# Patient Record
Sex: Male | Born: 1960
Health system: Southern US, Community
[De-identification: ages and names within clinical notes are randomized; demographics above are authoritative.]

## PROBLEM LIST (undated history)

## (undated) DIAGNOSIS — C719 Malignant neoplasm of brain, unspecified: Secondary | ICD-10-CM

## (undated) DIAGNOSIS — E079 Disorder of thyroid, unspecified: Secondary | ICD-10-CM

## (undated) DIAGNOSIS — R569 Unspecified convulsions: Secondary | ICD-10-CM

## (undated) HISTORY — DX: Unspecified convulsions: R56.9

## (undated) HISTORY — PX: BRAIN SURGERY: SHX531

## (undated) HISTORY — PX: OTHER SURGICAL HISTORY: SHX169

## (undated) HISTORY — DX: Malignant neoplasm of brain, unspecified: C71.9

## (undated) NOTE — *Deleted (*Deleted)
[place] PHYSICAL MEDICINE & REHABILITATION PROGRESS NOTE   Subjective/Complaints:    ROS- neg CP, SOB, N/V/D. No dysuria  Objective:   No results found. No results for input(s): WBC, HGB, HCT, PLT in the last 72 hours. No results for input(s): NA, K, CL, CO2, GLUCOSE, BUN, CREATININE, CALCIUM in the last 72 hours.  Intake/Output Summary (Last 24 hours) at 10/05/2020 1302 Last data filed at 10/05/2020 0806 Gross per 24 hour  Intake 462 ml  Output 750 ml  Net -288 ml        Physical Exam: Vital Signs Blood pressure 125/72, pulse (!) 57, temperature 97.9 F (36.6 C), temperature source Oral, resp. rate 18, height 5\' 10"  (1.778 m), weight 84.4 kg, SpO2 94 %.   General: No acute distress Mood and affect are appropriate Heart: Regular rate and rhythm no rubs murmurs or extra sounds Lungs: Clear to auscultation, breathing unlabored, no rales or wheezes Abdomen: Positive bowel sounds, soft nontender to palpation, nondistended Extremities: No clubbing, cyanosis, or edema Skin: No evidence of breakdown, no evidence of rash  Neurologic: Cranial nerves II through XII intact, motor strength is 5/5 in RIght  deltoid, bicep, tricep, grip, hip flexor, knee extensors, ankle dorsiflexor and plantar flexor Brunnstrom 3 LUE Tone left pec and biceps Sensation to LT reported as equal BUE and BLE Musculoskeletal: Full range of motion in all 4 extremities. No joint swelling  Assessment/Plan: 1. Functional deficits which require 3+ hours per day of interdisciplinary therapy in a comprehensive inpatient rehab setting.  Physiatrist is providing close team supervision and 24 hour management of active medical problems listed below.  Physiatrist and rehab team continue to assess barriers to discharge/monitor patient progress toward functional and medical goals  Care Tool:  Bathing    Body parts bathed by patient: Left arm, Chest, Abdomen, Right upper leg, Left upper leg, Right lower  leg, Left lower leg, Face, Front perineal area   Body parts bathed by helper: Right arm, Buttocks     Bathing assist Assist Level: Moderate Assistance - Patient 50 - 74%     Upper Body Dressing/Undressing Upper body dressing   What is the patient wearing?: Hospital gown only    Upper body assist Assist Level: Total Assistance - Patient < 25%    Lower Body Dressing/Undressing Lower body dressing      What is the patient wearing?: Incontinence brief     Lower body assist Assist for lower body dressing: Dependent - Patient 0%     Toileting Toileting    Toileting assist Assist for toileting: Maximal Assistance - Patient 25 - 49%     Transfers Chair/bed transfer  Transfers assist     Chair/bed transfer assist level: Moderate Assistance - Patient 50 - 74%     Locomotion Ambulation   Ambulation assist      Assist level: 2 helpers (+2 mod A) Assistive device: Hand held assist Max distance: 77ft   Walk 10 feet activity   Assist     Assist level: 2 helpers (+2 mod A) Assistive device: Hand held assist   Walk 50 feet activity   Assist Walk 50 feet with 2 turns activity did not occur: Safety/medical concerns  Assist level: 2 helpers (+2 mod A) Assistive device: Hand held assist    Walk 150 feet activity   Assist Walk 150 feet activity did not occur: Safety/medical concerns         Walk 10 feet on uneven surface  activity   Assist Walk  10 feet on uneven surfaces activity did not occur: Safety/medical concerns         Wheelchair     Assist Will patient use wheelchair at discharge?: Yes Type of Wheelchair: Manual    Wheelchair assist level: Moderate Assistance - Patient 50 - 74% Max wheelchair distance: 100    Wheelchair 50 feet with 2 turns activity    Assist        Assist Level: Moderate Assistance - Patient 50 - 74%   Wheelchair 150 feet activity     Assist      Assist Level: Maximal Assistance - Patient 25 -  49%   Blood pressure 125/72, pulse (!) 57, temperature 97.9 F (36.6 C), temperature source Oral, resp. rate 18, height 5\' 10"  (1.778 m), weight 84.4 kg, SpO2 94 %.    Medical Problem List and Plan: 1.  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  Antithrombotics: -DVT/anticoagulation: SCDs             -antiplatelet therapy: Aspirin 81 mg daily and Plavix 75 mg daily x3 weeks then aspirin alone 3. Pain Management: Tylenol as needed. Denies pain 4. Mood: Lexapro 10 mg daily.  Provide emotional support             -antipsychotic agents: N/A 5. Neuropsych: This patient is capable of making decisions on his own behalf. 6. Skin/Wound Care: Routine skin checks 7. Fluids/Electrolytes/Nutrition: intake yesterday  , meal intake 70-75% Electrolytes stable on 11/13 8.  Seizure disorder.  Continue Lamictal 225 mg daily and 300 mg nightly, no seizure since rehab admission 9.  Hyperlipidemia.  LDL 131 on 11/13. Continue Lipitor 10.  Hypothyroidism.  Synthroid 11.  Leukocytosis 11.6 afebrile  12.  Occ bladder incont but usually cont- denied problems at home PTA 13.  Bowel incont ~50% colace to 200mg  BID, pt feels bowels are moving better  14.  Sleep is fair cont melatonin 5mg  LOS: 5 days A FACE TO FACE EVALUATION WAS PERFORMED  Erick Colace 10/05/2020, 1:02 PM

## (undated) NOTE — *Deleted (*Deleted)
Occupational Therapy Weekly Progress Note  Patient Details  Name: Howard White MRN: [id number] Date of Birth: [birth year]  Beginning of progress report period: {Time; dates multiple:304500300} End of progress report period: {Time; dates multiple:304500300}  {CHL IP REHAB OT TIME CALCULATIONS:304400400}   Patient has met {number 1-5:22450} of {number 1-5:20334} short term goals.  ***  Patient continues to demonstrate the following deficits: {impairments:3041632} and therefore will continue to benefit from skilled OT intervention to enhance overall performance with {ADL/iADL:3041649}.  Patient {LTG progression:3041653}.  {plan of YQMV:7846962}   OT Short Term Goals {OT XBM:8413244}  Skilled Therapeutic Interventions/Progress Updates:      Therapy Documentation Precautions:  Precautions Precautions: Fall Precaution Comments: [name], fluctuating tone in the LUE Restrictions Weight Bearing Restrictions: No  Pain:   ADL: See Care Tool for more details.   Therapy/Group: Individual Therapy  Claudie Revering MS, OTR/L   10/16/2020, 7:27 AM

---

## 2011-10-19 ENCOUNTER — Other Ambulatory Visit: Payer: Self-pay | Admitting: Internal Medicine

## 2011-10-19 DIAGNOSIS — H532 Diplopia: Secondary | ICD-10-CM

## 2011-10-21 ENCOUNTER — Ambulatory Visit
Admission: RE | Admit: 2011-10-21 | Discharge: 2011-10-21 | Disposition: A | Discharge status: Home / Self Care | Payer: Medicare Other | Source: Ambulatory Visit | Attending: Internal Medicine | Admitting: Internal Medicine

## 2011-10-21 DIAGNOSIS — H532 Diplopia: Secondary | ICD-10-CM

## 2011-10-21 MED ORDER — GADOBENATE DIMEGLUMINE 529 MG/ML IV SOLN
20.0000 mL | Freq: Once | INTRAVENOUS | Status: AC | PRN
  Administered 2011-10-21: 20 mL via INTRAVENOUS

## 2011-12-10 DIAGNOSIS — J329 Chronic sinusitis, unspecified: Secondary | ICD-10-CM | POA: Diagnosis not present

## 2011-12-13 DIAGNOSIS — J069 Acute upper respiratory infection, unspecified: Secondary | ICD-10-CM | POA: Diagnosis not present

## 2012-02-07 DIAGNOSIS — E039 Hypothyroidism, unspecified: Secondary | ICD-10-CM | POA: Diagnosis not present

## 2012-02-07 DIAGNOSIS — H519 Unspecified disorder of binocular movement: Secondary | ICD-10-CM | POA: Diagnosis not present

## 2012-02-07 DIAGNOSIS — H532 Diplopia: Secondary | ICD-10-CM | POA: Diagnosis not present

## 2012-02-07 DIAGNOSIS — M6281 Muscle weakness (generalized): Secondary | ICD-10-CM | POA: Diagnosis not present

## 2012-02-22 DIAGNOSIS — R5381 Other malaise: Secondary | ICD-10-CM | POA: Diagnosis not present

## 2012-02-22 DIAGNOSIS — N529 Male erectile dysfunction, unspecified: Secondary | ICD-10-CM | POA: Diagnosis not present

## 2012-02-22 DIAGNOSIS — R5383 Other fatigue: Secondary | ICD-10-CM | POA: Diagnosis not present

## 2012-02-29 DIAGNOSIS — R21 Rash and other nonspecific skin eruption: Secondary | ICD-10-CM | POA: Diagnosis not present

## 2012-02-29 DIAGNOSIS — F329 Major depressive disorder, single episode, unspecified: Secondary | ICD-10-CM | POA: Diagnosis not present

## 2012-03-27 DIAGNOSIS — IMO0002 Reserved for concepts with insufficient information to code with codable children: Secondary | ICD-10-CM | POA: Diagnosis not present

## 2012-03-27 DIAGNOSIS — H519 Unspecified disorder of binocular movement: Secondary | ICD-10-CM | POA: Diagnosis not present

## 2012-03-27 DIAGNOSIS — C719 Malignant neoplasm of brain, unspecified: Secondary | ICD-10-CM | POA: Diagnosis not present

## 2012-03-27 DIAGNOSIS — H5000 Unspecified esotropia: Secondary | ICD-10-CM | POA: Diagnosis not present

## 2012-03-28 DIAGNOSIS — E039 Hypothyroidism, unspecified: Secondary | ICD-10-CM | POA: Diagnosis not present

## 2012-03-28 DIAGNOSIS — F329 Major depressive disorder, single episode, unspecified: Secondary | ICD-10-CM | POA: Diagnosis not present

## 2012-05-02 DIAGNOSIS — F329 Major depressive disorder, single episode, unspecified: Secondary | ICD-10-CM | POA: Diagnosis not present

## 2012-05-02 DIAGNOSIS — E039 Hypothyroidism, unspecified: Secondary | ICD-10-CM | POA: Diagnosis not present

## 2012-05-04 DIAGNOSIS — IMO0002 Reserved for concepts with insufficient information to code with codable children: Secondary | ICD-10-CM | POA: Diagnosis not present

## 2012-05-04 DIAGNOSIS — H52229 Regular astigmatism, unspecified eye: Secondary | ICD-10-CM | POA: Diagnosis not present

## 2012-05-04 DIAGNOSIS — H5210 Myopia, unspecified eye: Secondary | ICD-10-CM | POA: Diagnosis not present

## 2012-05-04 DIAGNOSIS — H5010 Unspecified exotropia: Secondary | ICD-10-CM | POA: Diagnosis not present

## 2012-05-04 DIAGNOSIS — Z79899 Other long term (current) drug therapy: Secondary | ICD-10-CM | POA: Diagnosis not present

## 2012-05-04 DIAGNOSIS — H524 Presbyopia: Secondary | ICD-10-CM | POA: Diagnosis not present

## 2012-07-03 DIAGNOSIS — C768 Malignant neoplasm of other specified ill-defined sites: Secondary | ICD-10-CM | POA: Diagnosis not present

## 2012-07-03 DIAGNOSIS — Z79899 Other long term (current) drug therapy: Secondary | ICD-10-CM | POA: Diagnosis not present

## 2012-07-03 DIAGNOSIS — IMO0002 Reserved for concepts with insufficient information to code with codable children: Secondary | ICD-10-CM | POA: Diagnosis not present

## 2012-07-03 DIAGNOSIS — H519 Unspecified disorder of binocular movement: Secondary | ICD-10-CM | POA: Diagnosis not present

## 2012-07-03 DIAGNOSIS — H5000 Unspecified esotropia: Secondary | ICD-10-CM | POA: Diagnosis not present

## 2012-08-01 DIAGNOSIS — R5381 Other malaise: Secondary | ICD-10-CM | POA: Diagnosis not present

## 2012-11-22 ENCOUNTER — Other Ambulatory Visit (HOSPITAL_COMMUNITY): Payer: Self-pay | Admitting: Internal Medicine

## 2012-11-22 DIAGNOSIS — C719 Malignant neoplasm of brain, unspecified: Secondary | ICD-10-CM | POA: Diagnosis not present

## 2012-11-22 DIAGNOSIS — F05 Delirium due to known physiological condition: Secondary | ICD-10-CM

## 2012-11-22 DIAGNOSIS — R569 Unspecified convulsions: Secondary | ICD-10-CM | POA: Diagnosis not present

## 2012-11-27 ENCOUNTER — Ambulatory Visit (HOSPITAL_COMMUNITY)
Admission: RE | Admit: 2012-11-27 | Discharge: 2012-11-27 | Disposition: A | Discharge status: Home / Self Care | Payer: Medicare Other | Source: Ambulatory Visit | Attending: Internal Medicine | Admitting: Internal Medicine

## 2012-11-27 DIAGNOSIS — G8929 Other chronic pain: Secondary | ICD-10-CM | POA: Diagnosis not present

## 2012-11-27 DIAGNOSIS — C719 Malignant neoplasm of brain, unspecified: Secondary | ICD-10-CM

## 2012-11-27 DIAGNOSIS — R110 Nausea: Secondary | ICD-10-CM | POA: Diagnosis not present

## 2012-11-27 DIAGNOSIS — R259 Unspecified abnormal involuntary movements: Secondary | ICD-10-CM | POA: Diagnosis not present

## 2012-11-27 DIAGNOSIS — R569 Unspecified convulsions: Secondary | ICD-10-CM

## 2012-11-27 DIAGNOSIS — R55 Syncope and collapse: Secondary | ICD-10-CM | POA: Insufficient documentation

## 2012-11-27 DIAGNOSIS — F05 Delirium due to known physiological condition: Secondary | ICD-10-CM

## 2012-11-27 MED ORDER — GADOBENATE DIMEGLUMINE 529 MG/ML IV SOLN
20.0000 mL | Freq: Once | INTRAVENOUS | Status: AC | PRN
  Administered 2012-11-27: 20 mL via INTRAVENOUS

## 2012-11-28 NOTE — Procedures (Signed)
EEG NUMBER:  [id number]  REFERRING PHYSICIAN:  Unavailable.  INDICATION FOR STUDY:  A 52 year old man with a history of left brain cancer and surgical resection at age 41.  He recently had an episode where he lost use of his right arm and was unable to speak transiently. Study is being performed to rule out signs of focal seizure activity.  DESCRIPTION:  This is a routine EEG recording performed during wakefulness.  Background activity was asymmetric consistent with the patient's history of tumor resection from the left hemisphere. Continuous 4-5 Hz moderate amplitude beta activity was recorded from the left frontoparietal and temporal regions, which at times was somewhat rhythmic.  Background activity recorded from the right hemisphere was normal.  10 Hz alpha rhythm was recorded from the right and left occipital regions, which attenuated well with eye opening.  Photic stimulation produced a symmetrical occipital driving response. Hyperventilation produced bilateral slowing of cerebral activity, which was slightly more pronounced on the left compared to the right. Occasional phase reversing left frontal sharp wave discharges were recorded.  INTERPRETATION:  EEG is abnormal with focal slowing of activity recorded from the left frontal, parietal, and temporal regions consistent with the patient's history of prior tumor resection.  Sharp wave discharges as well as rhythmic slowing were recorded from the left hemisphere indicative of likely potential for producing clinical seizure activity.     Noel Christmas, MD    AV:WUJW D:  11/27/2012 17:00:51  T:  11/28/2012 02:34:02  Job #:  [id number]

## 2012-11-30 DIAGNOSIS — C719 Malignant neoplasm of brain, unspecified: Secondary | ICD-10-CM | POA: Diagnosis not present

## 2012-11-30 DIAGNOSIS — R569 Unspecified convulsions: Secondary | ICD-10-CM | POA: Diagnosis not present

## 2013-03-30 ENCOUNTER — Ambulatory Visit (INDEPENDENT_AMBULATORY_CARE_PROVIDER_SITE_OTHER): Payer: Medicare Other | Admitting: Nurse Practitioner

## 2013-03-30 ENCOUNTER — Encounter: Payer: Self-pay | Admitting: Nurse Practitioner

## 2013-03-30 VITALS — BP 107/67 | HR 76 | Ht 70.75 in | Wt 213.0 lb

## 2013-03-30 DIAGNOSIS — C719 Malignant neoplasm of brain, unspecified: Secondary | ICD-10-CM | POA: Diagnosis not present

## 2013-03-30 DIAGNOSIS — R569 Unspecified convulsions: Secondary | ICD-10-CM | POA: Diagnosis not present

## 2013-03-30 DIAGNOSIS — G40909 Epilepsy, unspecified, not intractable, without status epilepticus: Secondary | ICD-10-CM | POA: Insufficient documentation

## 2013-03-30 NOTE — Progress Notes (Signed)
HPI: Patient returns for followup after initial evaluation by Dr. Terrace Arabia 11/30/2012. He has a past medical history of left frontal anaplastic astrocytoma grade 3, underwent resection at [facility] followed by radiation and chemotherapy. He had one generalized seizure in February 2001 and few complex partial seizures prior to her surgery. Resection occurred in March 2001, he did well postsurgically was taking Dilantin switched to Keppra 500 2  tablets at night. He had a seizure 11/08/2012 in the setting of family stress and having a beer. He was noted to have difficulty getting his words out,  numbness of his right shoulder and arm and right leg jerking but no loss of consciousness. This lasted approximately 5 minutes. Repeat MRI of the brain showed left frontal lobectomy, periventricular white matter changes consistent with radiation damage but no acute lesions in comparison to previous scan December 2012. He has not had further seizure activity since that time. His Keppra was increased to 750 extended release 2 at night. He denies any side effects of the medication  ROS: f/u seizure  Physical Exam General: well developed, well nourished, seated, in no evident distress Head: well-healed left frontal scar Oropharynx benign Neck: supple with no carotid or supraclavicular bruits Cardiovascular: regular rate and rhythm, no murmurs  Neurologic Exam Mental Status: Awake and fully alert. Oriented to place and time. Follows all commands. Mood and affect appropriate.  Cranial Nerves: Fundoscopic exam reveals sharp disc margins. Pupils equal, briskly reactive to light. Extraocular movements full without nystagmus. Visual fields full to confrontation. Hearing intact and symmetric to finger snap. Facial sensation intact. Face, tongue, palate move normally and symmetrically. Neck flexion and extension normal.  Motor: Normal bulk and tone. Normal strength in all tested extremity muscles. Sensory.: intact to touch  and pinprick and vibratory.  Coordination: Rapid alternating movements normal in all extremities. Finger-to-nose and heel-to-shin performed accurately bilaterally. Gait and Station: Arises from chair without difficulty. Stance is normal. Gait demonstrates normal stride length and balance . Able to heel, toe and tandem walk without difficulty.  Reflexes:2+ and symmetric. Toes downgoing.     ASSESSMENT: History of left frontal anaplastic astrocytoma status post left frontal lobectomy followed by chemotherapy and radiation with recurrent complex partial seizure 11/08/2012. MRI showed stable postsurgical changes.     PLAN: He will continue his Keppra XR 750mg  2 at night. He does not need refills He was given a list of common seizure triggers He will followup in 6 months  Nilda Riggs, GNP-BC APRN

## 2013-03-30 NOTE — Patient Instructions (Addendum)
Pt to continue Keppra XR 750mg  2 hs does not need refills Pt given examples of seizure triggers F/U 6 months

## 2013-07-18 DIAGNOSIS — F329 Major depressive disorder, single episode, unspecified: Secondary | ICD-10-CM | POA: Diagnosis not present

## 2013-07-18 DIAGNOSIS — E039 Hypothyroidism, unspecified: Secondary | ICD-10-CM | POA: Diagnosis not present

## 2013-07-18 DIAGNOSIS — R5381 Other malaise: Secondary | ICD-10-CM | POA: Diagnosis not present

## 2013-08-20 DIAGNOSIS — E039 Hypothyroidism, unspecified: Secondary | ICD-10-CM | POA: Diagnosis not present

## 2013-08-28 DIAGNOSIS — L570 Actinic keratosis: Secondary | ICD-10-CM | POA: Diagnosis not present

## 2013-08-28 DIAGNOSIS — D485 Neoplasm of uncertain behavior of skin: Secondary | ICD-10-CM | POA: Diagnosis not present

## 2013-08-28 DIAGNOSIS — L820 Inflamed seborrheic keratosis: Secondary | ICD-10-CM | POA: Diagnosis not present

## 2013-08-28 DIAGNOSIS — D239 Other benign neoplasm of skin, unspecified: Secondary | ICD-10-CM | POA: Diagnosis not present

## 2013-08-28 DIAGNOSIS — L821 Other seborrheic keratosis: Secondary | ICD-10-CM | POA: Diagnosis not present

## 2013-09-03 DIAGNOSIS — M25569 Pain in unspecified knee: Secondary | ICD-10-CM | POA: Diagnosis not present

## 2013-09-10 DIAGNOSIS — M25569 Pain in unspecified knee: Secondary | ICD-10-CM | POA: Diagnosis not present

## 2013-09-10 DIAGNOSIS — IMO0002 Reserved for concepts with insufficient information to code with codable children: Secondary | ICD-10-CM | POA: Diagnosis not present

## 2013-09-12 DIAGNOSIS — IMO0002 Reserved for concepts with insufficient information to code with codable children: Secondary | ICD-10-CM | POA: Diagnosis not present

## 2013-09-12 DIAGNOSIS — M25569 Pain in unspecified knee: Secondary | ICD-10-CM | POA: Diagnosis not present

## 2013-09-14 DIAGNOSIS — M25569 Pain in unspecified knee: Secondary | ICD-10-CM | POA: Diagnosis not present

## 2013-09-14 DIAGNOSIS — IMO0002 Reserved for concepts with insufficient information to code with codable children: Secondary | ICD-10-CM | POA: Diagnosis not present

## 2013-09-17 DIAGNOSIS — M25569 Pain in unspecified knee: Secondary | ICD-10-CM | POA: Diagnosis not present

## 2013-09-17 DIAGNOSIS — IMO0002 Reserved for concepts with insufficient information to code with codable children: Secondary | ICD-10-CM | POA: Diagnosis not present

## 2013-10-05 ENCOUNTER — Ambulatory Visit: Payer: Medicare Other | Admitting: Nurse Practitioner

## 2013-11-26 DIAGNOSIS — D485 Neoplasm of uncertain behavior of skin: Secondary | ICD-10-CM | POA: Diagnosis not present

## 2013-11-26 DIAGNOSIS — L570 Actinic keratosis: Secondary | ICD-10-CM | POA: Diagnosis not present

## 2013-11-30 DIAGNOSIS — IMO0002 Reserved for concepts with insufficient information to code with codable children: Secondary | ICD-10-CM | POA: Diagnosis not present

## 2013-11-30 DIAGNOSIS — S99929A Unspecified injury of unspecified foot, initial encounter: Secondary | ICD-10-CM | POA: Diagnosis not present

## 2013-11-30 DIAGNOSIS — S59909A Unspecified injury of unspecified elbow, initial encounter: Secondary | ICD-10-CM | POA: Diagnosis not present

## 2013-11-30 DIAGNOSIS — S8990XA Unspecified injury of unspecified lower leg, initial encounter: Secondary | ICD-10-CM | POA: Diagnosis not present

## 2013-12-17 ENCOUNTER — Encounter (INDEPENDENT_AMBULATORY_CARE_PROVIDER_SITE_OTHER): Payer: Self-pay

## 2013-12-17 ENCOUNTER — Other Ambulatory Visit: Payer: Self-pay | Admitting: Neurology

## 2013-12-17 ENCOUNTER — Encounter: Payer: Self-pay | Admitting: Nurse Practitioner

## 2013-12-17 ENCOUNTER — Ambulatory Visit (INDEPENDENT_AMBULATORY_CARE_PROVIDER_SITE_OTHER): Payer: Medicare Other | Admitting: Nurse Practitioner

## 2013-12-17 VITALS — BP 115/70 | HR 65 | Ht 70.5 in | Wt 206.0 lb

## 2013-12-17 DIAGNOSIS — R569 Unspecified convulsions: Secondary | ICD-10-CM | POA: Diagnosis not present

## 2013-12-17 DIAGNOSIS — C719 Malignant neoplasm of brain, unspecified: Secondary | ICD-10-CM

## 2013-12-17 MED ORDER — LEVETIRACETAM ER 750 MG PO TB24: ORAL_TABLET | ORAL | Status: DC

## 2013-12-17 NOTE — Progress Notes (Signed)
[facility]  PATIENT: Howard White DOB: [birth year]   REASON FOR VISIT: follow up for seizure disorder   HISTORY OF PRESENT ILLNESS: Mr. Buttery, 53 year old male returns for followup. He has a history of seizure disorder with last seizure occurring 11/08/2012. He is currently on Keppra extended release 750 mg 2 tabs at night. He denies side effects to the medication. He returns for refills and reevaluation   HISTORY: He has a past medical history of left frontal anaplastic astrocytoma grade 3, underwent resection at [facility] followed by radiation and chemotherapy. He had one generalized seizure in February 2001 and few complex partial seizures prior to her surgery. Resection occurred in March 2001, he did well postsurgically was taking Dilantin switched to Keppra 500 2 tablets at night. He had a seizure 11/08/2012 in the setting of family stress and having a beer. He was noted to have difficulty getting his words out, numbness of his right shoulder and arm and right leg jerking but no loss of consciousness. This lasted approximately 5 minutes. Repeat MRI of the brain showed left frontal lobectomy, periventricular white matter changes consistent with radiation damage but no acute lesions in comparison to previous scan December 2012. He has not had further seizure activity since that time. His Keppra was increased to 750 extended release 2 at night. He denies any side effects of the medication   REVIEW OF SYSTEMS: Full 14 system review of systems performed and notable only for those listed, all others are neg:  Constitutional: N/A  Cardiovascular: N/A  Ear/Nose/Throat: N/A  Skin: N/A  Eyes: N/A  Respiratory: N/A  Gastroitestinal: N/A  Hematology/Lymphatic: N/A  Endocrine: N/A Musculoskeletal:N/A  Allergy/Immunology: N/A  Neurological: N/A Psychiatric: N/A   ALLERGIES: Allergies  Allergen Reactions  . Lactose Intolerance (Gi)     HOME  MEDICATIONS: Outpatient Prescriptions Prior to Visit  Medication Sig Dispense Refill  . escitalopram (LEXAPRO) 20 MG tablet       . fluticasone (FLONASE) 50 MCG/ACT nasal spray       . levothyroxine (SYNTHROID, LEVOTHROID) 50 MCG tablet        No facility-administered medications prior to visit.    PAST MEDICAL HISTORY: Past Medical History  Diagnosis Date  . Brain cancer   . Seizures     PAST SURGICAL HISTORY: Past Surgical History  Procedure Laterality Date  . Brain cancer      FAMILY HISTORY: Family History  Problem Relation Age of Onset  . Brain cancer    . Thyroid disease    . Cancer    . Diabetes    . Stroke      SOCIAL HISTORY: History   Social History  . Marital Status: Divorced    Spouse Name: N/A    Number of Children: 0  . Years of Education: Masters   Occupational History  . Disability    Social History Main Topics  . Smoking status: Never Smoker   . Smokeless tobacco: Never Used  . Alcohol Use: Yes     Comment: 2 beers a month  . Drug Use: No  . Sexual Activity: Not on file   Other Topics Concern  . Not on file   Social History Narrative   Patient lives at home alone.    Patient is on long term disability.    Patient is divorced.    Patient has no children.    Patient has his Master's in Business.    Patient is right-handed.  PHYSICAL EXAM  Filed Vitals:   12/17/13 1448  BP: 115/70  Pulse: 65  Height: 5' 10.5" (1.791 m)  Weight: 206 lb (93.441 kg)   Body mass index is 29.13 kg/(m^2).  Generalized: Well developed, in no acute distress  Head: Healed left frontal scar.    Neurological examination   Mentation: Alert oriented to time, place, history taking. Follows all commands speech and language fluent  Cranial nerve II-XII: Fundoscopic exam reveals sharp disc margins.Pupils were equal round reactive to light extraocular movements were full, visual field were full on confrontational test. Facial sensation and strength  were normal. hearing was intact to finger rubbing bilaterally. Uvula tongue midline. head turning and shoulder shrug were normal and symmetric.Tongue protrusion into cheek strength was normal. Motor: normal bulk and tone, full strength in the BUE, BLE, fine finger movements normal, no pronator drift. No focal weakness Coordination: finger-nose-finger, heel-to-shin bilaterally, no dysmetria Reflexes: Brachioradialis 2/2, biceps 2/2, triceps 2/2, patellar 2/2, Achilles 2/2, plantar responses were flexor bilaterally. Gait and Station: Rising up from seated position without assistance, normal stance,  moderate stride, good arm swing, smooth turning, able to perform tiptoe, and heel walking without difficulty. Tandem gait is steady  DIAGNOSTIC DATA (LABS, IMAGING, TESTING) -  ASSESSMENT AND PLAN  53 y.o. year old male  has a past medical history of Brain cancer and Seizures. here in followup. Last seizure occurred 11/08/2012.   He is currently well controlled on Keppra Continue Keppra at current dose Will refill for one year Followup yearly and when necessary Dennie Bible, [facility], [facility], [facility] [address], [place], [place] [address] [phone]

## 2013-12-17 NOTE — Patient Instructions (Signed)
Continue Keppra at current dose Will refill for one year Followup yearly and when necessary

## 2014-01-02 DIAGNOSIS — L089 Local infection of the skin and subcutaneous tissue, unspecified: Secondary | ICD-10-CM | POA: Diagnosis not present

## 2014-01-03 ENCOUNTER — Encounter (HOSPITAL_BASED_OUTPATIENT_CLINIC_OR_DEPARTMENT_OTHER): Payer: Medicare Other | Attending: Internal Medicine

## 2014-01-03 DIAGNOSIS — E039 Hypothyroidism, unspecified: Secondary | ICD-10-CM | POA: Diagnosis not present

## 2014-01-03 DIAGNOSIS — S71109A Unspecified open wound, unspecified thigh, initial encounter: Secondary | ICD-10-CM | POA: Diagnosis not present

## 2014-01-03 DIAGNOSIS — S71009A Unspecified open wound, unspecified hip, initial encounter: Secondary | ICD-10-CM | POA: Diagnosis not present

## 2014-01-03 DIAGNOSIS — Z79899 Other long term (current) drug therapy: Secondary | ICD-10-CM | POA: Insufficient documentation
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
## 2014-01-08 DIAGNOSIS — E039 Hypothyroidism, unspecified: Secondary | ICD-10-CM | POA: Diagnosis not present

## 2014-01-08 DIAGNOSIS — S71009A Unspecified open wound, unspecified hip, initial encounter: Secondary | ICD-10-CM | POA: Diagnosis not present

## 2014-01-08 DIAGNOSIS — Z79899 Other long term (current) drug therapy: Secondary | ICD-10-CM | POA: Diagnosis not present

## 2014-01-11 DIAGNOSIS — L089 Local infection of the skin and subcutaneous tissue, unspecified: Secondary | ICD-10-CM | POA: Diagnosis not present

## 2014-01-11 DIAGNOSIS — Z23 Encounter for immunization: Secondary | ICD-10-CM | POA: Diagnosis not present

## 2014-01-15 ENCOUNTER — Encounter (HOSPITAL_BASED_OUTPATIENT_CLINIC_OR_DEPARTMENT_OTHER): Payer: Medicare Other | Attending: General Surgery

## 2014-01-15 DIAGNOSIS — S71109A Unspecified open wound, unspecified thigh, initial encounter: Principal | ICD-10-CM | POA: Insufficient documentation

## 2014-01-15 DIAGNOSIS — X58XXXA Exposure to other specified factors, initial encounter: Secondary | ICD-10-CM | POA: Insufficient documentation

## 2014-01-15 DIAGNOSIS — S71009A Unspecified open wound, unspecified hip, initial encounter: Secondary | ICD-10-CM | POA: Diagnosis not present

## 2014-01-22 DIAGNOSIS — S71009A Unspecified open wound, unspecified hip, initial encounter: Secondary | ICD-10-CM | POA: Diagnosis not present

## 2014-01-29 DIAGNOSIS — S71009A Unspecified open wound, unspecified hip, initial encounter: Secondary | ICD-10-CM | POA: Diagnosis not present

## 2014-01-29 DIAGNOSIS — S71109A Unspecified open wound, unspecified thigh, initial encounter: Secondary | ICD-10-CM | POA: Diagnosis not present

## 2014-02-05 DIAGNOSIS — S71009A Unspecified open wound, unspecified hip, initial encounter: Secondary | ICD-10-CM | POA: Diagnosis not present

## 2014-02-06 DIAGNOSIS — J3489 Other specified disorders of nose and nasal sinuses: Secondary | ICD-10-CM | POA: Diagnosis not present

## 2014-02-12 DIAGNOSIS — S71009A Unspecified open wound, unspecified hip, initial encounter: Secondary | ICD-10-CM | POA: Diagnosis not present

## 2014-02-19 ENCOUNTER — Encounter (HOSPITAL_BASED_OUTPATIENT_CLINIC_OR_DEPARTMENT_OTHER): Payer: Medicare Other | Attending: General Surgery

## 2014-02-19 DIAGNOSIS — IMO0002 Reserved for concepts with insufficient information to code with codable children: Secondary | ICD-10-CM | POA: Insufficient documentation

## 2014-02-19 DIAGNOSIS — X58XXXA Exposure to other specified factors, initial encounter: Secondary | ICD-10-CM | POA: Insufficient documentation

## 2014-02-20 DIAGNOSIS — J3489 Other specified disorders of nose and nasal sinuses: Secondary | ICD-10-CM | POA: Diagnosis not present

## 2014-02-20 DIAGNOSIS — L02419 Cutaneous abscess of limb, unspecified: Secondary | ICD-10-CM | POA: Diagnosis not present

## 2014-02-20 DIAGNOSIS — L03119 Cellulitis of unspecified part of limb: Secondary | ICD-10-CM | POA: Diagnosis not present

## 2014-02-26 DIAGNOSIS — IMO0002 Reserved for concepts with insufficient information to code with codable children: Secondary | ICD-10-CM | POA: Diagnosis not present

## 2014-06-21 DIAGNOSIS — E039 Hypothyroidism, unspecified: Secondary | ICD-10-CM | POA: Diagnosis not present

## 2014-06-21 DIAGNOSIS — F329 Major depressive disorder, single episode, unspecified: Secondary | ICD-10-CM | POA: Diagnosis not present

## 2014-06-21 DIAGNOSIS — F3289 Other specified depressive episodes: Secondary | ICD-10-CM | POA: Diagnosis not present

## 2014-06-21 DIAGNOSIS — G9332 Myalgic encephalomyelitis/chronic fatigue syndrome: Secondary | ICD-10-CM | POA: Diagnosis not present

## 2014-06-21 DIAGNOSIS — R5382 Chronic fatigue, unspecified: Secondary | ICD-10-CM | POA: Diagnosis not present

## 2014-07-12 ENCOUNTER — Other Ambulatory Visit: Payer: Self-pay | Admitting: Internal Medicine

## 2014-07-12 DIAGNOSIS — R5383 Other fatigue: Secondary | ICD-10-CM | POA: Diagnosis not present

## 2014-07-12 DIAGNOSIS — C719 Malignant neoplasm of brain, unspecified: Secondary | ICD-10-CM | POA: Diagnosis not present

## 2014-07-12 DIAGNOSIS — R5381 Other malaise: Secondary | ICD-10-CM | POA: Diagnosis not present

## 2014-07-12 DIAGNOSIS — R569 Unspecified convulsions: Secondary | ICD-10-CM | POA: Diagnosis not present

## 2014-07-15 ENCOUNTER — Ambulatory Visit
Admission: RE | Admit: 2014-07-15 | Discharge: 2014-07-15 | Disposition: A | Discharge status: Home / Self Care | Payer: Medicare Other | Source: Ambulatory Visit | Attending: Internal Medicine | Admitting: Internal Medicine

## 2014-07-15 DIAGNOSIS — G9389 Other specified disorders of brain: Secondary | ICD-10-CM | POA: Diagnosis not present

## 2014-07-15 DIAGNOSIS — C719 Malignant neoplasm of brain, unspecified: Secondary | ICD-10-CM

## 2014-07-15 MED ORDER — GADOBENATE DIMEGLUMINE 529 MG/ML IV SOLN
19.0000 mL | Freq: Once | INTRAVENOUS | Status: AC | PRN
  Administered 2014-07-15: 19 mL via INTRAVENOUS

## 2014-07-18 ENCOUNTER — Ambulatory Visit (INDEPENDENT_AMBULATORY_CARE_PROVIDER_SITE_OTHER): Payer: Medicare Other | Admitting: Radiology

## 2014-07-18 ENCOUNTER — Other Ambulatory Visit: Payer: Medicare Other | Admitting: Radiology

## 2014-07-18 DIAGNOSIS — G40909 Epilepsy, unspecified, not intractable, without status epilepticus: Secondary | ICD-10-CM

## 2014-07-19 NOTE — Procedures (Signed)
   HISTORY: 53 years old male, with history of anaplastic astrocytoma, presenting with fatigue, weakness  TECHNIQUE:  16 channel EEG was performed based on standard 10-16 international system. One channel was dedicated to EKG, which has demonstrates normal sinus rhythm of 60 beats per minutes. She has a surgical scar at C3 placement side, with plate.  Upon awakening, the posterior background activity was well-developed, in alpha range,reactive to eye opening and closure. There was persistent asymmetry at left frontal area, mainly involving left C3, F3,, high amplitude, dysarrythmic theta range activity, sometimes with sharp spike appearance.  Photic stimulation was performed, which induced a symmetric photic driving.  Hyperventilation was not performed   Patient was drowsy during according, but no deeper stage of sleep was achieved.  CONCLUSION: This is an abnormal awake EEG.  There is  electrodiagnostic evidence of left frontal slowing, spike slow waves, consistent with local irritability, this is consistent with his history of left frontal craniotomy

## 2014-08-16 DIAGNOSIS — R400 Somnolence: Secondary | ICD-10-CM | POA: Diagnosis not present

## 2014-08-16 DIAGNOSIS — F329 Major depressive disorder, single episode, unspecified: Secondary | ICD-10-CM | POA: Diagnosis not present

## 2014-08-16 DIAGNOSIS — C719 Malignant neoplasm of brain, unspecified: Secondary | ICD-10-CM | POA: Diagnosis not present

## 2014-08-16 DIAGNOSIS — R569 Unspecified convulsions: Secondary | ICD-10-CM | POA: Diagnosis not present

## 2014-08-16 DIAGNOSIS — E039 Hypothyroidism, unspecified: Secondary | ICD-10-CM | POA: Diagnosis not present

## 2014-08-19 DIAGNOSIS — R569 Unspecified convulsions: Secondary | ICD-10-CM | POA: Diagnosis not present

## 2014-08-19 DIAGNOSIS — Z13220 Encounter for screening for lipoid disorders: Secondary | ICD-10-CM | POA: Diagnosis not present

## 2014-08-19 DIAGNOSIS — Z136 Encounter for screening for cardiovascular disorders: Secondary | ICD-10-CM | POA: Diagnosis not present

## 2014-08-19 DIAGNOSIS — E039 Hypothyroidism, unspecified: Secondary | ICD-10-CM | POA: Diagnosis not present

## 2014-08-19 DIAGNOSIS — C719 Malignant neoplasm of brain, unspecified: Secondary | ICD-10-CM | POA: Diagnosis not present

## 2014-08-19 DIAGNOSIS — Z125 Encounter for screening for malignant neoplasm of prostate: Secondary | ICD-10-CM | POA: Diagnosis not present

## 2014-08-19 DIAGNOSIS — Z0000 Encounter for general adult medical examination without abnormal findings: Secondary | ICD-10-CM | POA: Diagnosis not present

## 2014-08-19 DIAGNOSIS — F329 Major depressive disorder, single episode, unspecified: Secondary | ICD-10-CM | POA: Diagnosis not present

## 2014-08-19 DIAGNOSIS — Z1389 Encounter for screening for other disorder: Secondary | ICD-10-CM | POA: Diagnosis not present

## 2014-09-03 DIAGNOSIS — L821 Other seborrheic keratosis: Secondary | ICD-10-CM | POA: Diagnosis not present

## 2014-09-03 DIAGNOSIS — Z411 Encounter for cosmetic surgery: Secondary | ICD-10-CM | POA: Diagnosis not present

## 2014-09-03 DIAGNOSIS — D485 Neoplasm of uncertain behavior of skin: Secondary | ICD-10-CM | POA: Diagnosis not present

## 2014-09-03 DIAGNOSIS — D239 Other benign neoplasm of skin, unspecified: Secondary | ICD-10-CM | POA: Diagnosis not present

## 2014-09-03 DIAGNOSIS — Z86018 Personal history of other benign neoplasm: Secondary | ICD-10-CM | POA: Diagnosis not present

## 2014-09-03 DIAGNOSIS — L570 Actinic keratosis: Secondary | ICD-10-CM | POA: Diagnosis not present

## 2014-09-03 DIAGNOSIS — D225 Melanocytic nevi of trunk: Secondary | ICD-10-CM | POA: Diagnosis not present

## 2014-09-04 DIAGNOSIS — L988 Other specified disorders of the skin and subcutaneous tissue: Secondary | ICD-10-CM | POA: Diagnosis not present

## 2014-10-05 ENCOUNTER — Emergency Department (HOSPITAL_COMMUNITY)
Admission: EM | Admit: 2014-10-05 | Discharge: 2014-10-05 | Disposition: A | Discharge status: Home / Self Care | Payer: Medicare Other | Attending: Emergency Medicine | Admitting: Emergency Medicine

## 2014-10-05 ENCOUNTER — Encounter (HOSPITAL_COMMUNITY): Payer: Self-pay

## 2014-10-05 ENCOUNTER — Emergency Department (HOSPITAL_COMMUNITY): Payer: Medicare Other

## 2014-10-05 DIAGNOSIS — R110 Nausea: Secondary | ICD-10-CM | POA: Diagnosis not present

## 2014-10-05 DIAGNOSIS — Z7951 Long term (current) use of inhaled steroids: Secondary | ICD-10-CM | POA: Diagnosis not present

## 2014-10-05 DIAGNOSIS — Y9289 Other specified places as the place of occurrence of the external cause: Secondary | ICD-10-CM | POA: Insufficient documentation

## 2014-10-05 DIAGNOSIS — M545 Low back pain, unspecified: Secondary | ICD-10-CM

## 2014-10-05 DIAGNOSIS — W1789XA Other fall from one level to another, initial encounter: Secondary | ICD-10-CM | POA: Insufficient documentation

## 2014-10-05 DIAGNOSIS — S3992XA Unspecified injury of lower back, initial encounter: Secondary | ICD-10-CM | POA: Insufficient documentation

## 2014-10-05 DIAGNOSIS — Z85841 Personal history of malignant neoplasm of brain: Secondary | ICD-10-CM | POA: Diagnosis not present

## 2014-10-05 DIAGNOSIS — Y998 Other external cause status: Secondary | ICD-10-CM | POA: Diagnosis not present

## 2014-10-05 DIAGNOSIS — Y9389 Activity, other specified: Secondary | ICD-10-CM | POA: Diagnosis not present

## 2014-10-05 DIAGNOSIS — E039 Hypothyroidism, unspecified: Secondary | ICD-10-CM | POA: Diagnosis not present

## 2014-10-05 DIAGNOSIS — W19XXXA Unspecified fall, initial encounter: Secondary | ICD-10-CM

## 2014-10-05 DIAGNOSIS — Z79899 Other long term (current) drug therapy: Secondary | ICD-10-CM | POA: Insufficient documentation

## 2014-10-05 HISTORY — DX: Disorder of thyroid, unspecified: E07.9

## 2014-10-05 MED ORDER — HYDROCODONE-ACETAMINOPHEN 5-325 MG PO TABS: 2.0000 | ORAL_TABLET | ORAL | Status: DC | PRN

## 2014-10-05 MED ORDER — IBUPROFEN 400 MG PO TABS
400.0000 mg | ORAL_TABLET | Freq: Once | ORAL | Status: AC
  Administered 2014-10-05: 400 mg via ORAL
  Filled 2014-10-05: qty 1

## 2014-10-05 NOTE — Discharge Instructions (Signed)
Return to the emergency room with worsening of symptoms, new symptoms or with symptoms that are concerning, especially fevers, loss of control of bladder or bowels, numbness or tingling around genital region or anus, weakness. RICE: Rest, Ice (three cycles of 20 mins on, 39mns off at least twice a day), compression/brace, elevation. Heating pad works well for back pain. Ibuprofen 4049m(2 tablets 20079mevery 5-6 hours for 3-5 days and then as needed for pain. Norco for severe pain. Do not operate machinery, drive or drink alcohol while taking narcotics or muscle relaxers. Follow up with PCP/orthopedist if symptoms worsen or are persistent.   Back Exercises Back exercises help treat and prevent back injuries. The goal of back exercises is to increase the strength of your abdominal and back muscles and the flexibility of your back. These exercises should be started when you no longer have back pain. Back exercises include:  Pelvic Tilt. Lie on your back with your knees bent. Tilt your pelvis until the lower part of your back is against the floor. Hold this position 5 to 10 sec and repeat 5 to 10 times.  Knee to Chest. Pull first 1 knee up against your chest and hold for 20 to 30 seconds, repeat this with the other knee, and then both knees. This may be done with the other leg straight or bent, whichever feels better.  Sit-Ups or Curl-Ups. Bend your knees 90 degrees. Start with tilting your pelvis, and do a partial, slow sit-up, lifting your trunk only 30 to 45 degrees off the floor. Take at least 2 to 3 seconds for each sit-up. Do not do sit-ups with your knees out straight. If partial sit-ups are difficult, simply do the above but with only tightening your abdominal muscles and holding it as directed.  Hip-Lift. Lie on your back with your knees flexed 90 degrees. Push down with your feet and shoulders as you raise your hips a couple inches off the floor; hold for 10 seconds, repeat 5 to 10  times.  Back arches. Lie on your stomach, propping yourself up on bent elbows. Slowly press on your hands, causing an arch in your low back. Repeat 3 to 5 times. Any initial stiffness and discomfort should lessen with repetition over time.  Shoulder-Lifts. Lie face down with arms beside your body. Keep hips and torso pressed to floor as you slowly lift your head and shoulders off the floor. Do not overdo your exercises, especially in the beginning. Exercises may cause you some mild back discomfort which lasts for a few minutes; however, if the pain is more severe, or lasts for more than 15 minutes, do not continue exercises until you see your caregiver. Improvement with exercise therapy for back problems is slow.  See your caregivers for assistance with developing a proper back exercise program. Document Released: 12/09/2004 Document Revised: 01/24/2012 Document Reviewed: 09/02/2011 [facility] Information 2015 [place] information is not intended to replace advice given to you by your health care provider. Make sure you discuss any questions you have with your health care provider.   Back Injury Prevention Back injuries can be extremely painful and difficult to heal. After having one back injury, you are much more likely to experience another later on. It is important to learn how to avoid injuring or re-injuring your back. The following tips can help you to prevent a back injury. PHYSICAL FITNESS  Exercise regularly and try to develop good tone in your abdominal muscles. Your abdominal muscles provide  a lot of the support needed by your back.  Do aerobic exercises (walking, jogging, biking, swimming) regularly.  Do exercises that increase balance and strength (tai chi, yoga) regularly. This can decrease your risk of falling and injuring your back.  Stretch before and after exercising.  Maintain a healthy weight. The more you weigh, the more stress is placed on your back.  For every pound of weight, 10 times that amount of pressure is placed on the back. DIET  Talk to your caregiver about how much calcium and vitamin D you need per day. These nutrients help to prevent weakening of the bones (osteoporosis). Osteoporosis can cause broken (fractured) bones that lead to back pain.  Include good sources of calcium in your diet, such as dairy products, green, leafy vegetables, and products with calcium added (fortified).  Include good sources of vitamin D in your diet, such as milk and foods that are fortified with vitamin D.  Consider taking a nutritional supplement or a multivitamin if needed.  Stop smoking if you smoke. POSTURE  Sit and stand up straight. Avoid leaning forward when you sit or hunching over when you stand.  Choose chairs with good low back (lumbar) support.  If you work at a desk, sit close to your work so you do not need to lean over. Keep your chin tucked in. Keep your neck drawn back and elbows bent at a right angle. Your arms should look like the letter "L."  Sit high and close to the steering wheel when you drive. Add a lumbar support to your car seat if needed.  Avoid sitting or standing in one position for too long. Take breaks to get up, stretch, and walk around at least once every hour. Take breaks if you are driving for long periods of time.  Sleep on your side with your knees slightly bent, or sleep on your back with a pillow under your knees. Do not sleep on your stomach. LIFTING, TWISTING, AND REACHING  Avoid heavy lifting, especially repetitive lifting. If you must do heavy lifting:  Stretch before lifting.  Work slowly.  Rest between lifts.  Use carts and dollies to move objects when possible.  Make several small trips instead of carrying 1 heavy load.  Ask for help when you need it.  Ask for help when moving big, awkward objects.  Follow these steps when lifting:  Stand with your feet shoulder-width  apart.  Get as close to the object as you can. Do not try to pick up heavy objects that are far from your body.  Use handles or lifting straps if they are available.  Bend at your knees. Squat down, but keep your heels off the floor.  Keep your shoulders pulled back, your chin tucked in, and your back straight.  Lift the object slowly, tightening the muscles in your legs, abdomen, and buttocks. Keep the object as close to the center of your body as possible.  When you put a load down, use these same guidelines in reverse.  Do not:  Lift the object above your waist.  Twist at the waist while lifting or carrying a load. Move your feet if you need to turn, not your waist.  Bend over without bending at your knees.  Avoid reaching over your head, across a table, or for an object on a high surface. OTHER TIPS  Avoid wet floors and keep sidewalks clear of ice to prevent falls.  Do not sleep on a mattress that is  too soft or too hard.  Keep items that are used frequently within easy reach.  Put heavier objects on shelves at waist level and lighter objects on lower or higher shelves.  Find ways to decrease your stress, such as exercise, massage, or relaxation techniques. Stress can build up in your muscles. Tense muscles are more vulnerable to injury.  Seek treatment for depression or anxiety if needed. These conditions can increase your risk of developing back pain. SEEK MEDICAL CARE IF:  You injure your back.  You have questions about diet, exercise, or other ways to prevent back injuries. MAKE SURE YOU:  Understand these instructions.  Will watch your condition.  Will get help right away if you are not doing well or get worse. Document Released: 12/09/2004 Document Revised: 01/24/2012 Document Reviewed: 12/13/2011 [facility] Patient Information 2015 [place], [place]. This information is not intended to replace advice given to you by your health care provider. Make sure you  discuss any questions you have with your health care provider.

## 2014-10-05 NOTE — ED Provider Notes (Signed)
CSN: [id number]     Arrival date & time 10/05/14  0544 History   First MD Initiated Contact with Patient 10/05/14 0604     Chief Complaint  Patient presents with  . Fall     (Consider location/radiation/quality/duration/timing/severity/associated sxs/prior Treatment) HPI  Howard White is a 53 y.o. male with PMH of Brain cancer, seizures, hypothyroidism presenting after changing the battery in a beeping alarm and fell from 4 ft off a ladder at 0500 this morning. He should states he fell directly on the small of his back on [address]. He denies any head injury loss of consciousness but did endorse initial nausea but is now resolved. Patient took 2 Tylenol arthritic tablets and aspirin cream to the area with improvement of symptoms. Pain worse with movement. Patient denies history of back pain. No fevers, chills, night sweats, weight loss, IVDU. No loss of control of bladder or bowel. No numbness/tingling, weakness or saddle anesthesia.     Past Medical History  Diagnosis Date  . Brain cancer   . Seizures   . Thyroid disease     Hypothyroid   Past Surgical History  Procedure Laterality Date  . Brain cancer     Family History  Problem Relation Age of Onset  . Brain cancer    . Thyroid disease    . Cancer    . Diabetes    . Stroke     History  Substance Use Topics  . Smoking status: Never Smoker   . Smokeless tobacco: Never Used  . Alcohol Use: Yes     Comment: 2 beers a month    Review of Systems  Constitutional: Negative for fever and chills.  Respiratory: Negative for cough.   Cardiovascular: Negative for chest pain.  Gastrointestinal: Positive for nausea. Negative for vomiting and diarrhea.  Genitourinary: Negative for dysuria and hematuria.  Musculoskeletal: Positive for back pain.  Skin: Negative for rash.  Neurological: Negative for weakness.      Allergies  Lactose intolerance (gi)  Home Medications   Prior to Admission medications   Medication Sig  Start Date End Date Taking? Authorizing Provider  acetaminophen (TYLENOL) 650 MG CR tablet Take 1,300 mg by mouth every 8 (eight) hours as needed for pain.   Yes Historical Provider, MD  escitalopram (LEXAPRO) 20 MG tablet Take 20 mg by mouth daily.  03/26/13  Yes Historical Provider, MD  levETIRAcetam (KEPPRA) 750 MG tablet Take 750 mg by mouth 2 (two) times daily.   Yes Historical Provider, MD  levothyroxine (SYNTHROID, LEVOTHROID) 50 MCG tablet Take 50 mcg by mouth daily before breakfast.  02/05/13  Yes Historical Provider, MD  trolamine salicylate (ASPERCREME) 10 % cream Apply 1 application topically as needed for muscle pain.   Yes Historical Provider, MD  fluticasone Asencion Islam) 50 MCG/ACT nasal spray  02/05/13   Historical Provider, MD  HYDROcodone-acetaminophen (NORCO) 5-325 MG per tablet Take 2 tablets by mouth every 4 (four) hours as needed. 10/05/14   Pura Spice, PA-C  levETIRAcetam (KEPPRA) 500 MG tablet  08/29/14   Historical Provider, MD  Levetiracetam 750 MG TB24 2 tabs at bedtime 12/17/13   Dennie Bible, NP  levothyroxine (SYNTHROID, LEVOTHROID) 75 MCG tablet  08/26/14   Historical Provider, MD   BP 108/70 mmHg  Pulse 52  Temp(Src) 97.5 F (36.4 C) (Oral)  Resp 18  Ht 5\' 7"  (1.702 m)  Wt 206 lb (93.441 kg)  BMI 32.26 kg/m2  SpO2 100% Physical Exam  Constitutional: He appears  well-developed and well-nourished. No distress.  HENT:  Head: Normocephalic and atraumatic.  Eyes: Conjunctivae are normal. Right eye exhibits no discharge. Left eye exhibits no discharge.  Cardiovascular: Normal rate, regular rhythm and normal heart sounds.   Pulmonary/Chest: Effort normal and breath sounds normal. No respiratory distress. He has no wheezes.  Abdominal: Soft. Bowel sounds are normal. He exhibits no distension. There is no tenderness.  Musculoskeletal:  Mild midline back tenderness, step off or crepitus. Right and Left sided lower back tenderness. No CVA tenderness.    Neurological: He is alert. Coordination normal.  Equal muscle tone. 5/5 strength in lower extremities. DTR equal and intact. Negative straight leg test. Antalgic gait.   Skin: Skin is warm and dry. He is not diaphoretic.  Nursing note and vitals reviewed.   ED Course  Procedures (including critical care time) Labs Review Labs Reviewed - No data to display  Imaging Review Dg Lumbar Spine Complete  10/05/2014   CLINICAL DATA:  [place] this a.m. on small of lower back. Pain from coccyx up about 8 inches.  EXAM: LUMBAR SPINE - COMPLETE 4+ VIEW  COMPARISON:  None.  FINDINGS: Normal alignment of the lumbar spine. Mild degenerative endplate hypertrophic changes. Mild anterior wedging of T12 and L1 vertebra, associated degenerative changes, likely chronic. No focal bone lesion or bone destruction is appreciated. Note that the sacrococcygeal spine is not completely included on this lumbar spine study.  IMPRESSION: Mild degenerative changes in the lumbar spine. Mild chronic appearing anterior wedging of T12 and L1. No acute displaced fractures identified.   Electronically Signed   By: Lucienne Capers M.D.   On: 10/05/2014 06:45     EKG Interpretation None      Meds given in ED:  Medications  ibuprofen (ADVIL,MOTRIN) tablet 400 mg (400 mg Oral Given 10/05/14 0620)    New Prescriptions   HYDROCODONE-ACETAMINOPHEN (NORCO) 5-325 MG PER TABLET    Take 2 tablets by mouth every 4 (four) hours as needed.      MDM   Final diagnoses:  Bilateral low back pain without sciatica   Patient with back pain. No loss of bowel or bladder control. No saddle anesthesia. No fever, night sweats, weight loss, IVDU. Pt with history of brain cancer and has been in remission since 2007. VSS. No neurological deficits and normal neuro exam. Patient can walk but states is painful. No concern for cauda equina.  RICE protocol and pain medicine indicated and discussed with patient. Driving and sedation precautions  provided. Patient is afebrile, nontoxic, and in no acute distress. Patient is appropriate for outpatient management and is stable for discharge. Pt to follow up with PCP for persistent or worsening pain.   Discussed return precautions with patient. Discussed all results and patient verbalizes understanding and agrees with plan.  Case has been discussed with Dr. Darl Householder who agrees with the above plan and to discharge.      Pura Spice, PA-C 10/05/14 9450  Wandra Arthurs, MD 10/05/14 2256

## 2014-10-05 NOTE — ED Notes (Signed)
Pt presents with lower back pain due to falling 4 feet from a ladder at 0500 this am. Pt states he landed on the small of his back onto a hardwood floor. Pt states he took 2 Tylenol arthritic tablets and asper cream to the area. Pt denies LOC or hitting his head, pt does endorse nausea. Pt alert and oriented x4, pt ambultory but has pain with movement, denies numbness, tingling or loss of bowel and/or bladder.

## 2014-10-05 NOTE — ED Notes (Addendum)
Patient transported to X-ray 

## 2014-10-05 NOTE — ED Notes (Signed)
ED PA at bedside

## 2014-10-11 ENCOUNTER — Ambulatory Visit (INDEPENDENT_AMBULATORY_CARE_PROVIDER_SITE_OTHER): Payer: Medicare Other | Admitting: Family Medicine

## 2014-10-11 VITALS — BP 118/74 | HR 83 | Temp 98.1°F | Resp 16 | Ht 69.75 in | Wt 207.0 lb

## 2014-10-11 DIAGNOSIS — L0291 Cutaneous abscess, unspecified: Secondary | ICD-10-CM

## 2014-10-11 DIAGNOSIS — L0390 Cellulitis, unspecified: Secondary | ICD-10-CM

## 2014-10-11 MED ORDER — DOXYCYCLINE HYCLATE 100 MG PO CAPS: 100.0000 mg | ORAL_CAPSULE | Freq: Two times a day (BID) | ORAL | Status: DC

## 2014-10-11 NOTE — Progress Notes (Signed)
Subjective:  This chart was scribed for Delman Cheadle, MD by Dellis Filbert, ED Scribe at [facility].The patient was seen in exam room 08 and the patient's care was started at 12:29 PM.   Patient ID: Howard White, male    DOB: Aug 20, 1961, 53 y.o.   MRN: [id number] Chief Complaint  Patient presents with  . Abscess    x 4-5 days    HPI HPI Comments: Howard White is a 53 y.o. male who presents to [facility] complaining of an abscess on his buttock. It first appeared 5 days ago and he is worried it maybe infected. He has been using heating pad however this has been for his lower back pain. He has also tried cleaning it and using an antibiotic liquid which has provided some relief. He was on antibiotics for a road rash however he states he has not been on antibiotics for some time. He denies drainage.  Past Medical History  Diagnosis Date  . Brain cancer   . Seizures   . Thyroid disease     Hypothyroid   Current Outpatient Prescriptions on File Prior to Visit  Medication Sig Dispense Refill  . acetaminophen (TYLENOL) 650 MG CR tablet Take 1,300 mg by mouth every 8 (eight) hours as needed for pain.    Marland Kitchen escitalopram (LEXAPRO) 20 MG tablet Take 20 mg by mouth daily.     . fluticasone (FLONASE) 50 MCG/ACT nasal spray     . HYDROcodone-acetaminophen (NORCO) 5-325 MG per tablet Take 2 tablets by mouth every 4 (four) hours as needed. 15 tablet 0  . levETIRAcetam (KEPPRA) 500 MG tablet     . levETIRAcetam (KEPPRA) 750 MG tablet Take 750 mg by mouth 2 (two) times daily.    . Levetiracetam 750 MG TB24 2 tabs at bedtime 60 tablet 11  . levothyroxine (SYNTHROID, LEVOTHROID) 50 MCG tablet Take 50 mcg by mouth daily before breakfast.     . levothyroxine (SYNTHROID, LEVOTHROID) 75 MCG tablet     . trolamine salicylate (ASPERCREME) 10 % cream Apply 1 application topically as needed for muscle pain.     No current facility-administered medications on file prior to visit.   Allergies    Allergen Reactions  . Lactose Intolerance (Gi)    Review of Systems  Skin: Positive for color change and wound.      Objective:  BP 118/74 mmHg  Pulse 83  Temp(Src) 98.1 F (36.7 C) (Oral)  Resp 16  Ht 5' 9.75" (1.772 m)  Wt 207 lb (93.895 kg)  BMI 29.90 kg/m2  SpO2 94%  Physical Exam  Constitutional: He is oriented to person, place, and time. He appears well-developed and well-nourished.  HENT:  Head: Normocephalic and atraumatic.  Eyes: EOM are normal.  Neck: Normal range of motion.  Cardiovascular: Normal rate.   Pulmonary/Chest: Effort normal.  Musculoskeletal: Normal range of motion.  Neurological: He is alert and oriented to person, place, and time.  Skin: Skin is warm and dry.  2 inch diameter erythematic subcutaneous fluctuant mass on the left buttock.  Psychiatric: He has a normal mood and affect. His behavior is normal.  Nursing note and vitals reviewed.     Assessment & Plan:   Cellulitis and abscess - Plan: Wound culture I&D today by Nicola Girt - did not get much purulence out but abscess still seems large. Recheck in 2d. Meds ordered this encounter  Medications  . doxycycline (VIBRAMYCIN) 100 MG capsule    Sig: Take 1  capsule (100 mg total) by mouth 2 (two) times daily.    Dispense:  20 capsule    Refill:  0    I personally performed the services described in this documentation, which was scribed in my presence. The recorded information has been reviewed and considered, and addended by me as needed.  Delman Cheadle, MD MPH

## 2014-10-11 NOTE — Progress Notes (Signed)
Procedure Consent obtained. Area cleaned with alcohol. 2 1/2 cc 1% lido used. 1 1/2 cm incision made. Culture obtained. Minimal amount of purulence expressed. Wound explored. Wound tunnels approx 2 cm medially. Wound packed with 1/4 plain. Clean dressing applied.

## 2014-10-11 NOTE — Patient Instructions (Signed)
Abscess °Care After °An abscess (also called a boil or furuncle) is an infected area that contains a collection of pus. Signs and symptoms of an abscess include pain, tenderness, redness, or hardness, or you may feel a moveable soft area under your skin. An abscess can occur anywhere in the body. The infection may spread to surrounding tissues causing cellulitis. A cut (incision) by the surgeon was made over your abscess and the pus was drained out. Gauze may have been packed into the space to provide a drain that will allow the cavity to heal from the inside outwards. The boil may be painful for 5 to 7 days. Most people with a boil do not have high fevers. Your abscess, if seen early, may not have localized, and may not have been lanced. If not, another appointment may be required for this if it does not get better on its own or with medications. °HOME CARE INSTRUCTIONS  °· Only take over-the-counter or prescription medicines for pain, discomfort, or fever as directed by your caregiver. °· When you bathe, soak and then remove gauze or iodoform packs at least daily or as directed by your caregiver. You may then wash the wound gently with mild soapy water. Repack with gauze or do as your caregiver directs. °SEEK IMMEDIATE MEDICAL CARE IF:  °· You develop increased pain, swelling, redness, drainage, or bleeding in the wound site. °· You develop signs of generalized infection including muscle aches, chills, fever, or a general ill feeling. °· An oral temperature above 102° F (38.9° C) develops, not controlled by medication. °See your caregiver for a recheck if you develop any of the symptoms described above. If medications (antibiotics) were prescribed, take them as directed. °Document Released: 05/20/2005 Document Revised: 01/24/2012 Document Reviewed: 01/15/2008 °[employer]® Patient Information ©2015 [employer]. This information is not intended to replace advice given to you by your health care provider. Make sure  you discuss any questions you have with your health care provider. ° °Abscess °An abscess is an infected area that contains a collection of pus and debris. It can occur in almost any part of the body. An abscess is also known as a furuncle or boil. °CAUSES  °An abscess occurs when tissue gets infected. This can occur from blockage of oil or sweat glands, infection of hair follicles, or a minor injury to the skin. As the body tries to fight the infection, pus collects in the area and creates pressure under the skin. This pressure causes pain. People with weakened immune systems have difficulty fighting infections and get certain abscesses more often.  °SYMPTOMS °Usually an abscess develops on the skin and becomes a painful mass that is red, warm, and tender. If the abscess forms under the skin, you may feel a moveable soft area under the skin. Some abscesses break open (rupture) on their own, but most will continue to get worse without care. The infection can spread deeper into the body and eventually into the bloodstream, causing you to feel ill.  °DIAGNOSIS  °Your caregiver will take your medical history and perform a physical exam. A sample of fluid may also be taken from the abscess to determine what is causing your infection. °TREATMENT  °Your caregiver may prescribe antibiotic medicines to fight the infection. However, taking antibiotics alone usually does not cure an abscess. Your caregiver may need to make a small cut (incision) in the abscess to drain the pus. In some cases, gauze is packed into the abscess to reduce pain and to   continue draining the area. °HOME CARE INSTRUCTIONS  °· Only take over-the-counter or prescription medicines for pain, discomfort, or fever as directed by your caregiver. °· If you were prescribed antibiotics, take them as directed. Finish them even if you start to feel better. °· If gauze is used, follow your caregiver's directions for changing the gauze. °· To avoid spreading the  infection: °¨ Keep your draining abscess covered with a bandage. °¨ Wash your hands well. °¨ Do not share personal care items, towels, or whirlpools with others. °¨ Avoid skin contact with others. °· Keep your skin and clothes clean around the abscess. °· Keep all follow-up appointments as directed by your caregiver. °SEEK MEDICAL CARE IF:  °· You have increased pain, swelling, redness, fluid drainage, or bleeding. °· You have muscle aches, chills, or a general ill feeling. °· You have a fever. °MAKE SURE YOU:  °· Understand these instructions. °· Will watch your condition. °· Will get help right away if you are not doing well or get worse. °Document Released: 08/11/2005 Document Revised: 05/02/2012 Document Reviewed: 01/14/2012 °[employer]® Patient Information ©2015 [employer]. This information is not intended to replace advice given to you by your health care provider. Make sure you discuss any questions you have with your health care provider. ° °

## 2014-10-13 ENCOUNTER — Ambulatory Visit (INDEPENDENT_AMBULATORY_CARE_PROVIDER_SITE_OTHER): Payer: Medicare Other | Admitting: Family Medicine

## 2014-10-13 VITALS — BP 118/78 | HR 74 | Temp 97.6°F | Resp 16 | Ht 70.0 in | Wt 207.0 lb

## 2014-10-13 DIAGNOSIS — L0291 Cutaneous abscess, unspecified: Secondary | ICD-10-CM | POA: Diagnosis not present

## 2014-10-13 DIAGNOSIS — L0390 Cellulitis, unspecified: Secondary | ICD-10-CM

## 2014-10-13 NOTE — Progress Notes (Signed)
Verbal Consent Obtained from the patient. Local anesthesia with 6 cc 2% lidocaine plain. Sharps debridement of eschar to bloody tissue, 1 cm x 1 cm. Packed with 1/4 inch plain packing. Cleansed and dressed.

## 2014-10-13 NOTE — Progress Notes (Addendum)
Subjective:    Patient ID: Howard White, male    DOB: Jul 13, 1961, 53 y.o.   MRN: [id number] This chart was scribed for Howard Ray, MD by Marti Sleigh, Medical Scribe. This patient was seen in Room 3 and the patient's care was started a 10:31 AM.   HPI HPI Comments: Froylan White is a 53 y.o. male who presents to [facility] reporting for a follow up from an appointment two days ago with Dr. Brigitte Pulse. Pt was initially evaluated for abscess on left buttock. I&D was performed, wound was packed with 1/4 inch packing, culture obtained which was positive for staph oraus but sensitivities are pending. Pt was placed on doxycycline, 100mg  BID. Pt endorses resolving fever and chills, as well as sensitivity at wound site.     Patient Active Problem List   Diagnosis Date Noted  . Other convulsions 03/30/2013  . Malignant neoplasm of brain, unspecified site 03/30/2013   Past Medical History  Diagnosis Date  . Brain cancer   . Seizures   . Thyroid disease     Hypothyroid   Past Surgical History  Procedure Laterality Date  . Brain cancer    . Brain surgery     Allergies  Allergen Reactions  . Lactose Intolerance (Gi)    Prior to Admission medications   Medication Sig Start Date End Date Taking? Authorizing Provider  acetaminophen (TYLENOL) 650 MG CR tablet Take 1,300 mg by mouth every 8 (eight) hours as needed for pain.   Yes Historical Provider, MD  doxycycline (VIBRAMYCIN) 100 MG capsule Take 1 capsule (100 mg total) by mouth 2 (two) times daily. 10/11/14  Yes Shawnee Knapp, MD  escitalopram (LEXAPRO) 20 MG tablet Take 20 mg by mouth daily.  03/26/13  Yes Historical Provider, MD  fluticasone Asencion Islam) 50 MCG/ACT nasal spray  02/05/13  Yes Historical Provider, MD  HYDROcodone-acetaminophen (NORCO) 5-325 MG per tablet Take 2 tablets by mouth every 4 (four) hours as needed. 10/05/14  Yes Pura Spice, PA-C  levETIRAcetam (KEPPRA) 500 MG tablet  08/29/14  Yes Historical Provider, MD    levETIRAcetam (KEPPRA) 750 MG tablet Take 750 mg by mouth 2 (two) times daily.   Yes Historical Provider, MD  Levetiracetam 750 MG TB24 2 tabs at bedtime 12/17/13  Yes Dennie Bible, NP  levothyroxine (SYNTHROID, LEVOTHROID) 50 MCG tablet Take 50 mcg by mouth daily before breakfast.  02/05/13  Yes Historical Provider, MD  levothyroxine (SYNTHROID, LEVOTHROID) 75 MCG tablet  08/26/14  Yes Historical Provider, MD  trolamine salicylate (ASPERCREME) 10 % cream Apply 1 application topically as needed for muscle pain.   Yes Historical Provider, MD   History   Social History  . Marital Status: Divorced    Spouse Name: N/A    Number of Children: 0  . Years of Education: Masters   Occupational History  . Disability    Social History Main Topics  . Smoking status: Never Smoker   . Smokeless tobacco: Never Used  . Alcohol Use: Yes     Comment: 2 beers a month  . Drug Use: No  . Sexual Activity: Not on file   Other Topics Concern  . Not on file   Social History Narrative   Patient lives at home alone.    Patient is on long term disability.    Patient is divorced.    Patient has no children.    Patient has his Master's in Business.    Patient is right-handed.    Review  of Systems  Constitutional: Positive for fever (resolved) and chills (resolved).  Skin: Positive for wound.       Objective:   Physical Exam  Constitutional: He is oriented to person, place, and time. He appears well-developed and well-nourished.  HENT:  Head: Normocephalic and atraumatic.  Eyes: Pupils are equal, round, and reactive to light.  Neck: Neck supple.  Cardiovascular: Normal rate and regular rhythm.   Pulmonary/Chest: Effort normal and breath sounds normal. No respiratory distress.  Neurological: He is alert and oriented to person, place, and time.  Skin: Skin is warm and dry.  Left buttock open wound with packing intact. There is a yellow to white exudate around packing, none was expressed  with pressure. Wound with induration 1cm, and erythema extending 9cm by 5cm.   Psychiatric: He has a normal mood and affect. His behavior is normal.  Nursing note and vitals reviewed.   Filed Vitals:   10/13/14 0942  BP: 118/78  Pulse: 74  Temp: 97.6 F (36.4 C)  TempSrc: Oral  Resp: 16  Height: 5\' 10"  (1.778 m)  Weight: 207 lb (93.895 kg)  SpO2: 95%       Assessment & Plan:  Howard White is a 53 y.o. male Cellulitis and abscess  -Staph aureus on wound culture. Tolerating doxycycline - sensitivities pending. Eschar debrided, repacked per procedure note. Discussed hot compresses or heating pad 4-5 times per day.  Plan on recheck tomorrow for further wound care. Sooner if worse.   I personally performed the services described in this documentation, which was scribed in my presence. The recorded information has been reviewed and considered, and addended by me as needed.

## 2014-10-14 ENCOUNTER — Ambulatory Visit (INDEPENDENT_AMBULATORY_CARE_PROVIDER_SITE_OTHER): Payer: Medicare Other | Admitting: Physician Assistant

## 2014-10-14 VITALS — BP 116/66 | HR 68 | Temp 98.1°F | Resp 18 | Ht 70.0 in | Wt 208.0 lb

## 2014-10-14 DIAGNOSIS — L0291 Cutaneous abscess, unspecified: Secondary | ICD-10-CM

## 2014-10-14 DIAGNOSIS — L0390 Cellulitis, unspecified: Secondary | ICD-10-CM

## 2014-10-14 LAB — WOUND CULTURE: Gram Stain: NONE SEEN

## 2014-10-14 NOTE — Progress Notes (Signed)
   Subjective:    Patient ID: Howard White, male    DOB: May 23, 1961, 53 y.o.   MRN: [id number]  Chief Complaint  Patient presents with  . Wound Check    HPI  Presents for wound care. I&D of an abscess of the LEFT buttock on 10/11/2014. When he returned for wound care on 11/29 had had only minimal improvement in his symptoms. There was considerable induration persisting and there was considerable thick adherent eschar covering the walls of the wound. He was intolerant to debridement, so local anesthesia was provided and sharps debridement with scissors removed the eschar.  The wound was then repacked and dressed. Culture revealed Staph aureus, METHICILLIN RESISTANT, sensitive to doxycycline.  He continues to tolerate the doxycycline without difficulty.   This morning he showered and when removing the bandage, pulled out the packing as well. He still has pain, but notes much less than yesterday. No fever/chills.  Review of Systems     Objective:   Physical Exam  Constitutional: He is oriented to person, place, and time. He appears well-developed and well-nourished. He is active and cooperative. No distress.  BP 116/66 mmHg  Pulse 68  Temp(Src) 98.1 F (36.7 C) (Oral)  Resp 18  Ht 5\' 10"  (1.778 m)  Wt 208 lb (94.348 kg)  BMI 29.84 kg/m2  SpO2 98%   Eyes: Conjunctivae are normal.  Pulmonary/Chest: Effort normal.  Neurological: He is alert and oriented to person, place, and time.  Skin: Skin is warm and dry. Lesion noted.     Dressing removed. No packing in place. Erythema extends only 2-3 cm. Central wound about 1.5 cm, and lateral edge with recurrent eschar. Removed with pick-ups. Remaining wound bed is pink. Induration extends only 1 cm from wound, which while tender, is much less so than previous exam. Irrigated wound with 2% lidocaine and packed with 1/4 inch plain packing. Dressed.  Psychiatric: He has a normal mood and affect. His speech is normal and behavior is normal.           Assessment & Plan:  1. Cellulitis and abscess Continue doxycycline, warm compresses and minimal sitting.   Return in about 2 days (around 10/16/2014) for wound care.   Fara Chute, PA-C Physician Assistant-Certified [facility]

## 2014-10-16 ENCOUNTER — Ambulatory Visit (INDEPENDENT_AMBULATORY_CARE_PROVIDER_SITE_OTHER): Payer: Medicare Other | Admitting: Physician Assistant

## 2014-10-16 VITALS — BP 118/70 | HR 61 | Temp 97.8°F | Resp 16 | Ht 70.0 in | Wt 210.4 lb

## 2014-10-16 DIAGNOSIS — L0291 Cutaneous abscess, unspecified: Secondary | ICD-10-CM

## 2014-10-16 DIAGNOSIS — L0390 Cellulitis, unspecified: Secondary | ICD-10-CM

## 2014-10-16 NOTE — Progress Notes (Signed)
   Subjective:    Patient ID: Howard White, male    DOB: 04/21/1961, 53 y.o.   MRN: [id number]   PCP: Kandice Hams, MD  Chief Complaint  Patient presents with  . Wound Check    Cellulitis- L buttock     Allergies  Allergen Reactions  . Lactose Intolerance (Gi)     Patient Active Problem List   Diagnosis Date Noted  . Other convulsions 03/30/2013  . Malignant neoplasm of brain, unspecified site 03/30/2013    Prior to Admission medications   Medication Sig Start Date End Date Taking? Authorizing Provider  acetaminophen (TYLENOL) 650 MG CR tablet Take 1,300 mg by mouth every 8 (eight) hours as needed for pain.   Yes Historical Provider, MD  doxycycline (VIBRAMYCIN) 100 MG capsule Take 1 capsule (100 mg total) by mouth 2 (two) times daily. 10/11/14  Yes Shawnee Knapp, MD  escitalopram (LEXAPRO) 20 MG tablet Take 20 mg by mouth daily.  03/26/13  Yes Historical Provider, MD  fluticasone Asencion Islam) 50 MCG/ACT nasal spray  02/05/13  Yes Historical Provider, MD  Levetiracetam 750 MG TB24 2 tabs at bedtime 12/17/13  Yes Dennie Bible, NP  levothyroxine (SYNTHROID, LEVOTHROID) 75 MCG tablet  08/26/14  Yes Historical Provider, MD  HYDROcodone-acetaminophen (NORCO) 5-325 MG per tablet Take 2 tablets by mouth every 4 (four) hours as needed. Patient not taking: Reported on 10/14/2014 10/05/14   Pura Spice, PA-C  trolamine salicylate (ASPERCREME) 10 % cream Apply 1 application topically as needed for muscle pain.    Historical Provider, MD    Medical, Surgical, Family and Social History reviewed and updated.  HPI  This 53 y.o. male presents for evaluation of wound on the LEFT buttock, s/p I&D on 10/11/2014. Culture was MRSA.  Feeling so much better (75%), continues to tolerate the doxycycline with only a few more doses remaining.  Review of Systems     Objective:   Physical Exam BP 118/70 mmHg  Pulse 61  Temp(Src) 97.8 F (36.6 C) (Oral)  Resp 16  Ht 5\' 10"  (1.778 m)   Wt 210 lb 6.4 oz (95.437 kg)  BMI 30.19 kg/m2  SpO2 96% WDWNWM, A&O x 3.  Dressing and packing removed. Minimal erythema and induration. Still tender. Wound bed is pink and shallow, measuring 6 mm x 15 mm, 5 mm deep. No packing needed. Bandaid applied.       Assessment & Plan:  1. Cellulitis and abscess Complete antibiotic. Continue with warm compresses and minimizing the amount of sitting until completely healed. Wash daily with soap and water. Cover wound until completely closed.   Fara Chute, PA-C Physician Assistant-Certified [facility]

## 2014-10-16 NOTE — Patient Instructions (Signed)
Complete the antibiotic. Continue the warm compresses and minimize the sitting until the wound is completely healed.

## 2014-12-18 ENCOUNTER — Ambulatory Visit (INDEPENDENT_AMBULATORY_CARE_PROVIDER_SITE_OTHER): Payer: Medicare Other | Admitting: Nurse Practitioner

## 2014-12-18 ENCOUNTER — Encounter: Payer: Self-pay | Admitting: Nurse Practitioner

## 2014-12-18 VITALS — BP 109/72 | HR 66 | Ht 70.0 in | Wt 203.0 lb

## 2014-12-18 DIAGNOSIS — R5601 Complex febrile convulsions: Secondary | ICD-10-CM | POA: Diagnosis not present

## 2014-12-18 DIAGNOSIS — C719 Malignant neoplasm of brain, unspecified: Secondary | ICD-10-CM | POA: Diagnosis not present

## 2014-12-18 MED ORDER — LEVETIRACETAM ER 750 MG PO TB24: ORAL_TABLET | ORAL | Status: DC

## 2014-12-18 NOTE — Progress Notes (Signed)
I have reviewed and agreed above plan. 

## 2014-12-18 NOTE — Progress Notes (Signed)
[facility]  PATIENT: Howard White DOB: 03-May-1961   REASON FOR VISIT: Follow-up for seizure disorder HISTORY FROM: Patient    HISTORY OF PRESENT ILLNESS:[name], 54 year old male returns for yearly followup. He has a history of seizure disorder with last seizure occurring 11/08/2012. He is currently on Keppra extended release 750 mg 2 tabs at night. He denies side effects to the medication. He returns for refills and reevaluation.   HISTORY: He has a past medical history of left frontal anaplastic astrocytoma grade 3, underwent resection at [facility] followed by radiation and chemotherapy. He had one generalized seizure in February 2001 and few complex partial seizures prior to her surgery. Resection occurred in March 2001, he did well postsurgically was taking Dilantin switched to Keppra 500 2 tablets at night. He had a seizure 11/08/2012 in the setting of family stress and having a beer. He was noted to have difficulty getting his words out, numbness of his right shoulder and arm and right leg jerking but no loss of consciousness. This lasted approximately 5 minutes. Repeat MRI of the brain showed left frontal lobectomy, periventricular white matter changes consistent with radiation damage but no acute lesions in comparison to previous scan December 2012. He has not had further seizure activity since that time. His Keppra was increased to 750 extended release 2 at night. He denies any side effects of the medication   REVIEW OF SYSTEMS: Full 14 system review of systems performed and notable only for those listed, all others are neg:  Constitutional: neg  Cardiovascular: neg Ear/Nose/Throat: neg  Skin: neg Eyes: neg Respiratory: neg Gastroitestinal: neg  Hematology/Lymphatic: neg  Endocrine: neg Musculoskeletal:neg Allergy/Immunology: neg Neurological: neg Psychiatric: neg Sleep : neg   ALLERGIES: Allergies  Allergen Reactions  . Lactose  Intolerance (Gi)     HOME MEDICATIONS: Outpatient Prescriptions Prior to Visit  Medication Sig Dispense Refill  . acetaminophen (TYLENOL) 650 MG CR tablet Take 1,300 mg by mouth every 8 (eight) hours as needed for pain.    Marland Kitchen escitalopram (LEXAPRO) 20 MG tablet Take 20 mg by mouth daily.     . fluticasone (FLONASE) 50 MCG/ACT nasal spray     . Levetiracetam 750 MG TB24 2 tabs at bedtime 60 tablet 11  . levothyroxine (SYNTHROID, LEVOTHROID) 75 MCG tablet     . doxycycline (VIBRAMYCIN) 100 MG capsule Take 1 capsule (100 mg total) by mouth 2 (two) times daily. 20 capsule 0  . HYDROcodone-acetaminophen (NORCO) 5-325 MG per tablet Take 2 tablets by mouth every 4 (four) hours as needed. 15 tablet 0  . trolamine salicylate (ASPERCREME) 10 % cream Apply 1 application topically as needed for muscle pain.     No facility-administered medications prior to visit.    PAST MEDICAL HISTORY: Past Medical History  Diagnosis Date  . Brain cancer   . Seizures   . Thyroid disease     Hypothyroid    PAST SURGICAL HISTORY: Past Surgical History  Procedure Laterality Date  . Brain cancer    . Brain surgery      FAMILY HISTORY: Family History  Problem Relation Age of Onset  . Brain cancer    . Thyroid disease    . Cancer    . Diabetes    . Stroke      SOCIAL HISTORY: History   Social History  . Marital Status: Divorced    Spouse Name: N/A    Number of Children: 0  . Years of Education: Masters  Occupational History  . Disability    Social History Main Topics  . Smoking status: Never Smoker   . Smokeless tobacco: Never Used  . Alcohol Use: 0.0 oz/week    0 Not specified per week     Comment: 2 beers a month  . Drug Use: No  . Sexual Activity: Not on file   Other Topics Concern  . Not on file   Social History Narrative   Patient lives at home alone.    Patient is on long term disability.    Patient is divorced.    Patient has no children.    Patient has his Master's in  Business.    Patient is right-handed.     PHYSICAL EXAM  Filed Vitals:   12/18/14 1501  BP: 109/72  Pulse: 66  Height: 5\' 10"  (1.778 m)  Weight: 203 lb (92.08 kg)   Body mass index is 29.13 kg/(m^2).  Generalized: Well developed, in no acute distress  Head: normocephalic and atraumatic,. Oropharynx benign  Neck: Supple, no carotid bruits  Cardiac: Regular rate rhythm, no murmur  Musculoskeletal: No deformity   Neurological examination   Mentation: Alert oriented to time, place, history taking. Attention span and concentration appropriate. Recent and remote memory intact.  Follows all commands speech and language fluent.   Cranial nerve II-XII: Fundoscopic exam reveals sharp disc margins.Pupils were equal round reactive to light extraocular movements were full, visual field were full on confrontational test. Facial sensation and strength were normal. hearing was intact to finger rubbing bilaterally. Uvula tongue midline. head turning and shoulder shrug were normal and symmetric.Tongue protrusion into cheek strength was normal. Motor: normal bulk and tone, full strength in the BUE, BLE, fine finger movements normal, no pronator drift. No focal weakness Sensory: normal and symmetric to light touch, pinprick, and  Vibration, proprioception  Coordination: finger-nose-finger, heel-to-shin bilaterally, no dysmetria Reflexes: Brachioradialis 2/2, biceps 2/2, triceps 2/2, patellar 2/2, Achilles 2/2, plantar responses were flexor bilaterally. Gait and Station: Rising up from seated position without assistance, normal stance,  moderate stride, good arm swing, smooth turning, able to perform tiptoe, and heel walking without difficulty. Tandem gait is steady  DIAGNOSTIC DATA (LABS, IMAGING, TESTING) - I    ASSESSMENT AND PLAN  54 y.o. year old male  has a past medical history of Brain cancer; Seizures; and Thyroid disease. here to follow-up. Last seizure event 11/08/2012. He is currently  well-controlled on Keppra.  Continue  Keppra 750XR twice daily, will refill Call for any seizure activity Follow up yearly and prn Dennie Bible, [facility], [facility], [facility] [address] [place], [place] [address] [phone]

## 2014-12-18 NOTE — Patient Instructions (Signed)
Continue  Keppra will refill Follow up yearly and prn

## 2015-01-25 DIAGNOSIS — L03317 Cellulitis of buttock: Secondary | ICD-10-CM | POA: Diagnosis not present

## 2015-02-17 DIAGNOSIS — E039 Hypothyroidism, unspecified: Secondary | ICD-10-CM | POA: Diagnosis not present

## 2015-02-17 DIAGNOSIS — R569 Unspecified convulsions: Secondary | ICD-10-CM | POA: Diagnosis not present

## 2015-02-17 DIAGNOSIS — F329 Major depressive disorder, single episode, unspecified: Secondary | ICD-10-CM | POA: Diagnosis not present

## 2015-03-26 DIAGNOSIS — D225 Melanocytic nevi of trunk: Secondary | ICD-10-CM | POA: Diagnosis not present

## 2015-03-26 DIAGNOSIS — L570 Actinic keratosis: Secondary | ICD-10-CM | POA: Diagnosis not present

## 2015-05-14 ENCOUNTER — Telehealth: Payer: Self-pay | Admitting: Neurology

## 2015-05-14 MED ORDER — LEVETIRACETAM ER 750 MG PO TB24: ORAL_TABLET | ORAL | Status: DC

## 2015-05-14 NOTE — Telephone Encounter (Signed)
Patient called requesting a refill for  Levetiracetam 750 MG TB24 XR. Please send to [facility]. He has been taking 500mg  from Dr Delfina Redwood since March which was incorrect dose. Patient can be reached at [phone].

## 2015-05-14 NOTE — Telephone Encounter (Signed)
Last OV note says: Continue Keppra 750XR twice daily, will refill.  At that time a Rx was sent to the local pharm for this dose.   Rx has been sent to [facility] per patient request.  Receipt confirmed by pharmacy.  I called back to advise.  He is aware.

## 2015-07-07 DIAGNOSIS — L209 Atopic dermatitis, unspecified: Secondary | ICD-10-CM | POA: Diagnosis not present

## 2015-07-10 DIAGNOSIS — E039 Hypothyroidism, unspecified: Secondary | ICD-10-CM | POA: Diagnosis not present

## 2015-09-15 DIAGNOSIS — Z1389 Encounter for screening for other disorder: Secondary | ICD-10-CM | POA: Diagnosis not present

## 2015-09-15 DIAGNOSIS — R569 Unspecified convulsions: Secondary | ICD-10-CM | POA: Diagnosis not present

## 2015-09-15 DIAGNOSIS — C719 Malignant neoplasm of brain, unspecified: Secondary | ICD-10-CM | POA: Diagnosis not present

## 2015-09-15 DIAGNOSIS — F329 Major depressive disorder, single episode, unspecified: Secondary | ICD-10-CM | POA: Diagnosis not present

## 2015-09-15 DIAGNOSIS — Z0000 Encounter for general adult medical examination without abnormal findings: Secondary | ICD-10-CM | POA: Diagnosis not present

## 2015-09-15 DIAGNOSIS — E039 Hypothyroidism, unspecified: Secondary | ICD-10-CM | POA: Diagnosis not present

## 2015-09-15 DIAGNOSIS — Z125 Encounter for screening for malignant neoplasm of prostate: Secondary | ICD-10-CM | POA: Diagnosis not present

## 2015-09-22 DIAGNOSIS — L570 Actinic keratosis: Secondary | ICD-10-CM | POA: Diagnosis not present

## 2015-09-22 DIAGNOSIS — D485 Neoplasm of uncertain behavior of skin: Secondary | ICD-10-CM | POA: Diagnosis not present

## 2015-09-22 DIAGNOSIS — D225 Melanocytic nevi of trunk: Secondary | ICD-10-CM | POA: Diagnosis not present

## 2015-09-22 DIAGNOSIS — L918 Other hypertrophic disorders of the skin: Secondary | ICD-10-CM | POA: Diagnosis not present

## 2015-09-22 DIAGNOSIS — L821 Other seborrheic keratosis: Secondary | ICD-10-CM | POA: Diagnosis not present

## 2015-09-22 DIAGNOSIS — Z86018 Personal history of other benign neoplasm: Secondary | ICD-10-CM | POA: Diagnosis not present

## 2015-11-25 DIAGNOSIS — J209 Acute bronchitis, unspecified: Secondary | ICD-10-CM | POA: Diagnosis not present

## 2015-12-19 ENCOUNTER — Ambulatory Visit: Payer: Medicare Other | Admitting: Nurse Practitioner

## 2015-12-26 ENCOUNTER — Encounter: Payer: Self-pay | Admitting: Nurse Practitioner

## 2015-12-26 ENCOUNTER — Ambulatory Visit (INDEPENDENT_AMBULATORY_CARE_PROVIDER_SITE_OTHER): Payer: Medicare Other | Admitting: Nurse Practitioner

## 2015-12-26 DIAGNOSIS — C719 Malignant neoplasm of brain, unspecified: Secondary | ICD-10-CM | POA: Diagnosis not present

## 2015-12-26 DIAGNOSIS — R5601 Complex febrile convulsions: Secondary | ICD-10-CM | POA: Diagnosis not present

## 2015-12-26 MED ORDER — LEVETIRACETAM ER 750 MG PO TB24: ORAL_TABLET | ORAL | Status: DC

## 2015-12-26 NOTE — Progress Notes (Signed)
I reviewed above note and agree with the assessment and plan.  Rosalin Hawking, MD PhD Stroke Neurology 12/26/2015 1:25 PM

## 2015-12-26 NOTE — Patient Instructions (Signed)
Continue Keppra 750XR twice daily, will refill Call for any seizure activity Follow up yearly and prn

## 2015-12-26 NOTE — Progress Notes (Signed)
[facility]  PATIENT: Howard White DOB: [birth year]   REASON FOR VISIT: History of  neoplasm of the brain, seizure disorder HISTORY FROM: Patient    HISTORY OF PRESENT ILLNESS:[name], 55 year old male returns for yearly followup. He has a history of seizure disorder with last seizure occurring 11/08/2012. He is currently on Keppra extended release 750 mg 2 tabs at night. He denies side effects to the medication. He returns for refills and reevaluation.   HISTORY: He has a past medical history of left frontal anaplastic astrocytoma grade 3, underwent resection at [facility] followed by radiation and chemotherapy. He had one generalized seizure in February 2001 and few complex partial seizures prior to her surgery. Resection occurred in March 2001, he did well postsurgically was taking Dilantin switched to Keppra 500 2 tablets at night. He had a seizure 11/08/2012 in the setting of family stress and having a beer. He was noted to have difficulty getting his words out, numbness of his right shoulder and arm and right leg jerking but no loss of consciousness. This lasted approximately 5 minutes. Repeat MRI of the brain showed left frontal lobectomy, periventricular white matter changes consistent with radiation damage but no acute lesions in comparison to previous scan December 2012. He has not had further seizure activity since that time. His Keppra was increased to 750 extended release 2 at night. He denies any side effects of the medication   REVIEW OF SYSTEMS: Full 14 system review of systems performed and notable only for those listed, all others are neg:  Constitutional: neg  Cardiovascular: neg Ear/Nose/Throat: neg  Skin: neg Eyes: neg Respiratory: neg Gastroitestinal: neg  Hematology/Lymphatic: neg  Endocrine: neg Musculoskeletal:neg Allergy/Immunology: neg Neurological: neg Psychiatric: neg Sleep : neg   ALLERGIES: Allergies  Allergen Reactions    . Lactose Intolerance (Gi)     HOME MEDICATIONS: Outpatient Prescriptions Prior to Visit  Medication Sig Dispense Refill  . acetaminophen (TYLENOL) 650 MG CR tablet Take 1,300 mg by mouth every 8 (eight) hours as needed for pain.    Marland Kitchen escitalopram (LEXAPRO) 20 MG tablet Take 20 mg by mouth daily.     . fluticasone (FLONASE) 50 MCG/ACT nasal spray     . Levetiracetam 750 MG TB24 2 tabs at bedtime 180 tablet 3  . levothyroxine (SYNTHROID, LEVOTHROID) 75 MCG tablet Reported on 12/26/2015     No facility-administered medications prior to visit.    PAST MEDICAL HISTORY: Past Medical History  Diagnosis Date  . Brain cancer ([place])   . Seizures ([place])   . Thyroid disease     Hypothyroid    PAST SURGICAL HISTORY: Past Surgical History  Procedure Laterality Date  . Brain cancer    . Brain surgery      FAMILY HISTORY: Family History  Problem Relation Age of Onset  . Brain cancer    . Thyroid disease    . Cancer    . Diabetes    . Stroke      SOCIAL HISTORY: Social History   Social History  . Marital Status: Divorced    Spouse Name: N/A  . Number of Children: 0  . Years of Education: Masters   Occupational History  . Disability    Social History Main Topics  . Smoking status: Never Smoker   . Smokeless tobacco: Never Used  . Alcohol Use: 0.0 oz/week    0 Standard drinks or equivalent per week     Comment: 2 beers a month  . Drug  Use: No  . Sexual Activity: Not on file   Other Topics Concern  . Not on file   Social History Narrative   Patient lives at home alone.    Patient is on long term disability.    Patient is divorced.    Patient has no children.    Patient has his Master's in Business.    Patient is right-handed.     PHYSICAL EXAM  Filed Vitals:   12/26/15 0753  Height: 5\' 10"  (1.778 m)  Weight: 211 lb 3.2 oz (95.8 kg)   Body mass index is 30.3 kg/(m^2). Generalized: Well developed, in no acute distress  Head: normocephalic and  atraumatic,. Oropharynx benign  Neck: Supple, no carotid bruits  Cardiac: Regular rate rhythm, no murmur  Musculoskeletal: No deformity   Neurological examination   Mentation: Alert oriented to time, place, history taking. Attention span and concentration appropriate. Recent and remote memory intact. Follows all commands speech and language fluent.   Cranial nerve II-XII: Pupils were equal round reactive to light extraocular movements were full, visual field were full on confrontational test. Facial sensation and strength were normal. hearing was intact to finger rubbing bilaterally. Uvula tongue midline. head turning and shoulder shrug were normal and symmetric.Tongue protrusion into cheek strength was normal. Motor: normal bulk and tone, full strength in the BUE, BLE, fine finger movements normal, no pronator drift. No focal weakness Sensory: normal and symmetric to light touch, pinprick, and Vibration, proprioception  Coordination: finger-nose-finger, heel-to-shin bilaterally, no dysmetria Reflexes: Brachioradialis 2/2, biceps 2/2, triceps 2/2, patellar 2/2, Achilles 2/2, plantar responses were flexor bilaterally. Gait and Station: Rising up from seated position without assistance, normal stance, moderate stride, good arm swing, smooth turning, able to perform tiptoe, and heel walking without difficulty. Tandem gait is steady  DIAGNOSTIC DATA (LABS, IMAGING, TESTING)   ASSESSMENT AND PLAN 55 y.o. year old male has a past medical history of Brain cancer; Seizures; and Thyroid disease. here to follow-up. Last seizure event 11/08/2012. He is currently well-controlled on Keppra.The patient is a current patient of Dr. Krista White  who is out of the office today . This note is sent to the work in doctor.     Continue Keppra 750XR  2  daily, will refill Call for any seizure activity Follow up yearly and prn Howard Bible, [facility], [facility], [facility] [address]  [place], [place] [address] [phone]

## 2016-04-20 ENCOUNTER — Telehealth: Payer: Self-pay | Admitting: Neurology

## 2016-04-20 ENCOUNTER — Telehealth: Payer: Self-pay | Admitting: *Deleted

## 2016-04-20 DIAGNOSIS — R569 Unspecified convulsions: Secondary | ICD-10-CM | POA: Diagnosis not present

## 2016-04-20 DIAGNOSIS — F4489 Other dissociative and conversion disorders: Secondary | ICD-10-CM | POA: Diagnosis not present

## 2016-04-20 DIAGNOSIS — C719 Malignant neoplasm of brain, unspecified: Secondary | ICD-10-CM | POA: Diagnosis not present

## 2016-04-20 DIAGNOSIS — E039 Hypothyroidism, unspecified: Secondary | ICD-10-CM | POA: Diagnosis not present

## 2016-04-20 NOTE — Telephone Encounter (Signed)
Telephone encounter opened in error.

## 2016-04-20 NOTE — Telephone Encounter (Signed)
Howard White/Dr. Delfina Redwood [phone] reports possible seizure that started 2 weeks ago. An appointment was made for 04/22/16 with Dr. Krista Blue. Dr. Delfina Redwood would also like for patient to have EEG. Advised that the nurse would call if there were any other questions.

## 2016-04-20 NOTE — Telephone Encounter (Addendum)
I failed to reach patient by phone, left message  Please call and check on patient, if he has recurrent seizure, may increase his Keppra to keppra  extended release 750 mg 3 tablets every night

## 2016-04-21 NOTE — Telephone Encounter (Addendum)
Patient returned call - states he had one episode of confusion after being sleep deprived.  States he was also wearing the wrong glasses which was causing him problems with his vision  Feels he is doing well on his current dose of Keppra.  He will keep his appt on 04/22/16 to further discuss.

## 2016-04-22 ENCOUNTER — Encounter: Payer: Self-pay | Admitting: Neurology

## 2016-04-22 ENCOUNTER — Other Ambulatory Visit: Payer: Self-pay | Admitting: Internal Medicine

## 2016-04-22 ENCOUNTER — Ambulatory Visit (INDEPENDENT_AMBULATORY_CARE_PROVIDER_SITE_OTHER): Payer: Medicare Other | Admitting: Neurology

## 2016-04-22 VITALS — BP 119/86 | HR 72 | Ht 70.0 in | Wt 211.5 lb

## 2016-04-22 DIAGNOSIS — R569 Unspecified convulsions: Secondary | ICD-10-CM

## 2016-04-22 DIAGNOSIS — R5601 Complex febrile convulsions: Secondary | ICD-10-CM | POA: Diagnosis not present

## 2016-04-22 DIAGNOSIS — C719 Malignant neoplasm of brain, unspecified: Secondary | ICD-10-CM | POA: Diagnosis not present

## 2016-04-22 MED ORDER — LEVETIRACETAM ER 750 MG PO TB24: ORAL_TABLET | ORAL | Status: DC
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
## 2016-05-03 ENCOUNTER — Ambulatory Visit
Admission: RE | Admit: 2016-05-03 | Discharge: 2016-05-03 | Disposition: A | Discharge status: Home / Self Care | Payer: Medicare Other | Source: Ambulatory Visit | Attending: Neurology | Admitting: Neurology

## 2016-05-03 DIAGNOSIS — R5601 Complex febrile convulsions: Secondary | ICD-10-CM

## 2016-05-03 DIAGNOSIS — C719 Malignant neoplasm of brain, unspecified: Secondary | ICD-10-CM

## 2016-05-03 MED ORDER — GADOBENATE DIMEGLUMINE 529 MG/ML IV SOLN
20.0000 mL | Freq: Once | INTRAVENOUS | Status: AC | PRN
  Administered 2016-05-03: 20 mL via INTRAVENOUS

## 2016-05-04 ENCOUNTER — Telehealth: Payer: Self-pay | Admitting: Neurology

## 2016-05-04 NOTE — Telephone Encounter (Signed)
Spoke to patient he is aware of results.

## 2016-05-04 NOTE — Telephone Encounter (Signed)
Please call patient, MRI of the brain showed scar at the left frontal lobe, evidence of generalized hyperintensity signal changes consistent with his history of whole brain radiation in the past, there was no change compared to previous MRI scans in July 15 2014, there was no acute findings.  IMPRESSION: This MRI of the brain with and without contrast shows the following: 1. Large cystic resection cavity in the left frontal lobe, unchanged when compared to the MRI of the brain from 07/15/2014 representing the prior resection. 2. Generalized white matter T2/FLAIR hyperintense signal changes that might represent previous whole brain radiation 3. There is a normal enhancement pattern. 4. There are no acute findings.

## 2016-05-19 ENCOUNTER — Ambulatory Visit (INDEPENDENT_AMBULATORY_CARE_PROVIDER_SITE_OTHER): Payer: Medicare Other | Admitting: Neurology

## 2016-05-19 DIAGNOSIS — R569 Unspecified convulsions: Secondary | ICD-10-CM

## 2016-05-19 DIAGNOSIS — C719 Malignant neoplasm of brain, unspecified: Secondary | ICD-10-CM

## 2016-05-19 DIAGNOSIS — R5601 Complex febrile convulsions: Secondary | ICD-10-CM

## 2016-05-21 NOTE — Procedures (Signed)
   HISTORY: 55 years old male, with history of left frontal anaplastic astrocytoma grade 3, status post left frontal anatomy and whole brain radiation therapy in the past, had a history of seizure, on Keppra treatment, continue presenting with seizure-like event.  TECHNIQUE:  16 channel EEG was performed based on standard 10-16 international system. One channel was dedicated to EKG, which has demonstrates normal sinus rhythm of 90 beats per minutes.  Upon awakening, the posterior background activity was well-developed, in alpha range, reactive to eye opening and closure. There was frequent bilateral frontal muscle artifact, occasionally F3 electrode artifact.  There was no evidence of epileptiform discharge.  Photic stimulation was performed, which induced a symmetric photic driving.  Hyperventilation was performed, there was no abnormality elicit.  No sleep was achieved.  CONCLUSION: This is a  normal awake EEG.  There is no electrodiagnostic evidence of epileptiform discharge.  Marcial Pacas, M.D. Ph.D.  [facility] [place], [place] [address] Phone: [phone] Fax:      [phone]

## 2016-05-24 DIAGNOSIS — L309 Dermatitis, unspecified: Secondary | ICD-10-CM | POA: Diagnosis not present

## 2016-05-25 ENCOUNTER — Encounter: Payer: Self-pay | Admitting: Neurology

## 2016-05-25 ENCOUNTER — Ambulatory Visit (INDEPENDENT_AMBULATORY_CARE_PROVIDER_SITE_OTHER): Payer: Medicare Other | Admitting: Neurology

## 2016-05-25 VITALS — BP 114/82 | HR 76 | Ht 70.0 in | Wt 213.0 lb

## 2016-05-25 DIAGNOSIS — C719 Malignant neoplasm of brain, unspecified: Secondary | ICD-10-CM

## 2016-05-25 DIAGNOSIS — R5601 Complex febrile convulsions: Secondary | ICD-10-CM

## 2016-05-25 MED ORDER — LAMOTRIGINE 25 MG PO TABS: ORAL_TABLET | ORAL | Status: DC

## 2016-05-25 MED ORDER — LAMOTRIGINE ER 100 MG PO TB24: 300.0000 mg | ORAL_TABLET | Freq: Every day | ORAL | Status: DC
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
## 2016-06-01 ENCOUNTER — Telehealth: Payer: Self-pay | Admitting: *Deleted

## 2016-06-01 NOTE — Telephone Encounter (Signed)
Spoke to patient - he is currently taking a titrating lamotrigine dose with the plan to be on lamotrigine ER 300mg , qhs.  He is requesting a new rx be sent to [facility] for immediate release to replace the ER prescription, if appropriate. The pharmacy told him this would save him $4136.00 per year.

## 2016-06-02 MED ORDER — LAMOTRIGINE 150 MG PO TABS: 150.0000 mg | ORAL_TABLET | Freq: Two times a day (BID) | ORAL | Status: DC

## 2016-06-02 NOTE — Telephone Encounter (Signed)
Please let patient know, I have sent in lamotrigine 150 mg twice a day 180 tablets, 3 months supply with 4 refill to his pharmacy [facility]

## 2016-06-02 NOTE — Addendum Note (Signed)
Addended by: Marcial Pacas on: 06/02/2016 07:51 AM   Modules accepted: Orders, Medications

## 2016-06-02 NOTE — Telephone Encounter (Signed)
Patient is aware that this change has been made.

## 2016-06-03 NOTE — Telephone Encounter (Signed)
Patient called to advise generic Lamotrigine ER 90 day supply is $1,000 would like to know if Dr. Krista Blue has any other ideas. Please call [phone].

## 2016-06-04 ENCOUNTER — Telehealth: Payer: Self-pay | Admitting: *Deleted

## 2016-06-04 NOTE — Telephone Encounter (Signed)
Please discuss with Dr Krista Blue or Cecille Rubin as treating providers for this patient.

## 2016-06-04 NOTE — Telephone Encounter (Signed)
TC to patient left message. Dr. Army Melia had called in intermittent release 150 twice a day on 06/02/16. This should be  much cheaper than extended release which in your phone message was a thousand dollars ease call us back to clarify dose that you're currently on with titration

## 2016-06-07 NOTE — Telephone Encounter (Addendum)
I spoke with pt.   He is now on lamictal 25mg  tabs (taking 2 tabs bid) this week.  (has 240 tabs left from this prescription) for the titration.  I called [employer], spoke with [place].  She stated that pt will receive lamotrigine 150mg  tabs (1 tablet po BID) at $0 copay.  I LMVM for pt on home #, that he is to finish the titration pack, when he finishes 4 tabs po bid of the 25mg  tabs for one week, he will then start 150mg  po bid.  Pt is to call back if questions.  [place] stated that sent out on 06/03/16 order.

## 2016-06-07 NOTE — Telephone Encounter (Signed)
Pt called said he wants to c/a RX for lamoTRIgine (LAMICTAL) 150 MG tablet to [facility]. He said he has plenty of generic which will cover him for the next 4-5 weeks. He is continuing to look for ins or a program to help with expenses of the the medication

## 2016-06-07 NOTE — Telephone Encounter (Signed)
My previous note was not routed. Pls look at 06/07/16 note from me. Thanks

## 2016-06-07 NOTE — Telephone Encounter (Signed)
See other phone note

## 2016-07-10 ENCOUNTER — Other Ambulatory Visit: Payer: Self-pay | Admitting: Neurology

## 2016-07-20 ENCOUNTER — Telehealth: Payer: Self-pay | Admitting: Neurology

## 2016-07-20 ENCOUNTER — Encounter: Payer: Self-pay | Admitting: *Deleted

## 2016-07-20 NOTE — Telephone Encounter (Signed)
Phone note from 06/01/16 w/ Lovey Newcomer: He is now on lamictal 25mg  tabs (taking 2 tabs bid) this week.  (has 240 tabs left from this prescription) for the titration.  I called [employer] [facility], spoke with [place].  She stated that pt will receive lamotrigine 150mg  tabs (1 tablet po BID) at $0 copay.  Pt is aware and will contact [employer] to set up delivery for immediate release Lamictal.  He will call back with any problems.

## 2016-07-20 NOTE — Telephone Encounter (Signed)
Pt called in about lamoTRIgine (LAMICTAL) 150 MG tablet. It is $1000 for the rx. Is there anything that can be done to help reduce the price? He would like to speak with Dr. Krista Blue. Medication is a tier 4 medication and is not covered by his insurance. Please call [phone]

## 2016-07-20 NOTE — Telephone Encounter (Signed)
Spoke to pt - he would like to discuss his medication with Dr. Krista Blue - his appt has been moved up to this week.

## 2016-07-20 NOTE — Telephone Encounter (Signed)
Patient is calling back stating he failed to tell you that the lamoTRIgine (LAMICTAL) 150 MG tablet is affecting his balance. Please call to discuss.

## 2016-07-22 ENCOUNTER — Ambulatory Visit (INDEPENDENT_AMBULATORY_CARE_PROVIDER_SITE_OTHER): Payer: Medicare Other | Admitting: Neurology

## 2016-07-22 ENCOUNTER — Encounter: Payer: Self-pay | Admitting: Neurology

## 2016-07-22 VITALS — BP 131/78 | HR 68 | Ht 70.0 in | Wt 210.8 lb

## 2016-07-22 DIAGNOSIS — C719 Malignant neoplasm of brain, unspecified: Secondary | ICD-10-CM | POA: Diagnosis not present

## 2016-07-22 DIAGNOSIS — G40209 Localization-related (focal) (partial) symptomatic epilepsy and epileptic syndromes with complex partial seizures, not intractable, without status epilepticus: Secondary | ICD-10-CM | POA: Diagnosis not present

## 2016-07-22 DIAGNOSIS — R269 Unspecified abnormalities of gait and mobility: Secondary | ICD-10-CM | POA: Diagnosis not present
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
## 2016-07-24 LAB — CBC
Hematocrit: 47.8 % (ref 37.5–51.0)
Hemoglobin: 16.6 g/dL (ref 12.6–17.7)
MCH: 31 pg (ref 26.6–33.0)
MCHC: 34.7 g/dL (ref 31.5–35.7)
MCV: 89 fL (ref 79–97)
Platelets: 287 10*3/uL (ref 150–379)
RBC: 5.36 x10E6/uL (ref 4.14–5.80)
RDW: 13.1 % (ref 12.3–15.4)
WBC: 7.2 10*3/uL (ref 3.4–10.8)

## 2016-07-24 LAB — COMPREHENSIVE METABOLIC PANEL
ALT: 24 IU/L (ref 0–44)
AST: 22 IU/L (ref 0–40)
Albumin/Globulin Ratio: 2 (ref 1.2–2.2)
Albumin: 4.7 g/dL (ref 3.5–5.5)
Alkaline Phosphatase: 85 IU/L (ref 39–117)
BUN/Creatinine Ratio: 16 (ref 9–20)
BUN: 19 mg/dL (ref 6–24)
Bilirubin Total: 0.5 mg/dL (ref 0.0–1.2)
CO2: 27 mmol/L (ref 18–29)
Calcium: 10.3 mg/dL — ABNORMAL HIGH (ref 8.7–10.2)
Chloride: 101 mmol/L (ref 96–106)
Creatinine, Ser: 1.2 mg/dL (ref 0.76–1.27)
GFR calc Af Amer: 79 mL/min/{1.73_m2} (ref >59)
GFR calc non Af Amer: 68 mL/min/{1.73_m2} (ref >59)
Globulin, Total: 2.4 g/dL (ref 1.5–4.5)
Glucose: 93 mg/dL (ref 65–99)
Potassium: 6.2 mmol/L — ABNORMAL HIGH (ref 3.5–5.2)
Sodium: 142 mmol/L (ref 134–144)
Total Protein: 7.1 g/dL (ref 6.0–8.5)

## 2016-07-24 LAB — LAMOTRIGINE LEVEL: Lamotrigine Lvl: 5.8 ug/mL (ref 2.0–20.0)

## 2016-07-26 ENCOUNTER — Telehealth: Payer: Self-pay | Admitting: Neurology

## 2016-07-26 NOTE — Telephone Encounter (Signed)
Please call patient, laboratory evaluation was normal, lamotrigine level was 5.8, please also check if he still felt dizzy unsteady gait in the early morning with change of his medication schedules.

## 2016-07-26 NOTE — Telephone Encounter (Signed)
Left message for a return call

## 2016-07-27 NOTE — Telephone Encounter (Signed)
Spoke to patient - he is aware of lab results.  His symptoms have improved since taking his anticonvulsants correctly.  He will call us back with any further concerns.

## 2016-08-25 ENCOUNTER — Ambulatory Visit: Payer: Medicare Other | Admitting: Neurology

## 2016-09-04 ENCOUNTER — Other Ambulatory Visit: Payer: Self-pay | Admitting: Neurology

## 2016-09-30 DIAGNOSIS — F3341 Major depressive disorder, recurrent, in partial remission: Secondary | ICD-10-CM | POA: Diagnosis not present

## 2016-09-30 DIAGNOSIS — E039 Hypothyroidism, unspecified: Secondary | ICD-10-CM | POA: Diagnosis not present

## 2016-09-30 DIAGNOSIS — C719 Malignant neoplasm of brain, unspecified: Secondary | ICD-10-CM | POA: Diagnosis not present

## 2016-09-30 DIAGNOSIS — R569 Unspecified convulsions: Secondary | ICD-10-CM | POA: Diagnosis not present

## 2016-09-30 DIAGNOSIS — R269 Unspecified abnormalities of gait and mobility: Secondary | ICD-10-CM | POA: Diagnosis not present

## 2016-09-30 DIAGNOSIS — Z1389 Encounter for screening for other disorder: Secondary | ICD-10-CM | POA: Diagnosis not present

## 2016-09-30 DIAGNOSIS — Z0001 Encounter for general adult medical examination with abnormal findings: Secondary | ICD-10-CM | POA: Diagnosis not present

## 2016-09-30 DIAGNOSIS — Z125 Encounter for screening for malignant neoplasm of prostate: Secondary | ICD-10-CM | POA: Diagnosis not present

## 2016-10-04 ENCOUNTER — Ambulatory Visit: Payer: Medicare Other | Admitting: Nurse Practitioner

## 2016-10-12 ENCOUNTER — Encounter: Payer: Self-pay | Admitting: Nurse Practitioner

## 2016-10-12 ENCOUNTER — Ambulatory Visit (INDEPENDENT_AMBULATORY_CARE_PROVIDER_SITE_OTHER): Payer: Medicare Other | Admitting: Nurse Practitioner

## 2016-10-12 VITALS — BP 116/80 | HR 103 | Ht 70.0 in | Wt 209.0 lb

## 2016-10-12 DIAGNOSIS — G40209 Localization-related (focal) (partial) symptomatic epilepsy and epileptic syndromes with complex partial seizures, not intractable, without status epilepticus: Secondary | ICD-10-CM

## 2016-10-12 DIAGNOSIS — R413 Other amnesia: Secondary | ICD-10-CM

## 2016-10-12 DIAGNOSIS — R269 Unspecified abnormalities of gait and mobility: Secondary | ICD-10-CM

## 2016-10-12 MED ORDER — LAMOTRIGINE 150 MG PO TABS: 300.0000 mg | ORAL_TABLET | Freq: Two times a day (BID) | ORAL | 2 refills | Status: DC

## 2016-10-12 NOTE — Progress Notes (Signed)
I have reviewed and agreed above plan. 
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
## 2016-10-12 NOTE — Patient Instructions (Addendum)
Will increase Lamictal 150mg   to 2 pills twice daily Will obtain neuro psych eval F/U in 6 months with Dr Krista Blue

## 2016-10-14 ENCOUNTER — Ambulatory Visit (INDEPENDENT_AMBULATORY_CARE_PROVIDER_SITE_OTHER): Payer: Medicare Other | Admitting: Psychology

## 2016-10-14 ENCOUNTER — Encounter: Payer: Self-pay | Admitting: Psychology

## 2016-10-14 DIAGNOSIS — G40209 Localization-related (focal) (partial) symptomatic epilepsy and epileptic syndromes with complex partial seizures, not intractable, without status epilepticus: Secondary | ICD-10-CM | POA: Diagnosis not present

## 2016-10-14 DIAGNOSIS — R413 Other amnesia: Secondary | ICD-10-CM

## 2016-10-14 DIAGNOSIS — C711 Malignant neoplasm of frontal lobe: Secondary | ICD-10-CM

## 2016-10-14 NOTE — Progress Notes (Signed)
NEUROPSYCHOLOGICAL INTERVIEW (CPT: [id number])  Name: Zykel Ozment Date of Birth: 07-09-1961 Date of Interview: 10/14/2016  Reason for Referral:  Tracey Witteman is a 55 y.o., right-handed male who is referred for neuropsychological evaluation by Evlyn Courier, NP, and Dr. Krista Blue of [facility] due to concerns about memory in the context of history of neoplasm of the brain and seizure disorder. This patient is accompanied in the office by his mother who supplements the history.  History of Presenting Problem:  Mr. Noguez has a history of left frontal anaplastic astrocytoma grade 3, discovered after he had a grand mal seizure in late 2000 or early 2001. There were no cognitive signs or symptoms prior to the diagnosis, per the patient and his mother. He underwent resection in March 2001 as well as chemotherapy and whole brain radiation. He reported that he was told he had 2-3 years to live. The patient was previously prescribed Keppra but this was switched to lamotrigine as he had been reporting mood instability/depression. Lamotrigine dose was recently doubled as there was concern about seizures involving his right hand (involuntary movements, dropping items held in his right hand without realizing it, loss of control of right hand). The patient is followed by Dr. Krista Blue and was most recently seen on 10/12/2016; he scored 24/30 on the MMSE at that time.   MRI of the brain (with and without contrast) completed on 05/03/2016 reportedly revealed the following: 1.   Large cystic resection cavity in the left frontal lobe, unchanged when compared to the MRI of the brain from 07/15/2014 representing the prior resection. 2.   Generalized white matter T2/FLAIR hyperintense signal changes that might represent previous whole brain radiation 3.   There is a normal enhancement pattern. 4.    There are no acute findings.  The patient and his mother report significant short term memory loss after resection  of his tumor. They do feel it has worsened over time or more recently. He continues to live independently (he lives one mile from his parents and from his brother) but he stopped driving due to the possible seizure activity. His family assists him with all instrumental ADLs.  He and his mother report forgetfulness for recent conversations/events, repeating the same stories, misplacing/losing items, forgetting appointments, starting but not finishing tasks, word finding difficulty, and word substitutions. They denied missing medication doses; he is compliant with his medications and his mother assists in organizing these and providing reminders when changes are made to his medications.   In terms of behavioral symptoms, the patient's mother reports that he sometimes uses a loud voice without realizing it. He may be in the middle of ordering food, for example, and will loudly say "No!". He has some trouble communicating in that he will begin a conversation "in the middle" or "go out of order".  His mother feels his mood has been more stable since switching to Lamotrigine but the patient continues to report some concerns about depression.  The patient and his mother note that he has demonstrated gait changes in the past several months with some falls. Mr. Redhead also reports he is particularly concerned about declining energy levels over the past two years. He has to take a daytime nap.   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 He was never treated for a head injury. His family  did not notice any cognitive or behavioral changes after any of the hits/possible concussions.  The patient denied psychiatric history prior to his brain cancer. He denied history of substance abuse or dependence.  Mr. Cavin reported that he was diagnosed with  Hashimoto's thyroiditis in the past. He takes Nature-Throid instead of synthroid. He expressed concern that Hashimoto's could be influencing his energy level.  The patient has had neuropsychological evaluation in the past. He believes his most recent evaluation was 2-3 years ago; however, it was not completed in the [facility] system.   Current Functioning: As noted previously, the patient lives independently. He is on long term disability. He receives assistance with instrumental ADLs. His family provides transportation. His mother assists with organization of his medications. His father assists with management of his finances (in particular, the patient goes to him before making any large purchases). His mother assists with appointments and schedules. He does not do any cooking as there is concern about leaving burners on.  The patient does not have a regular routine other than eating lunch with his brother most days. He does not have a set sleep schedule. He prefers to stay up late. He used to be involved in a singing group but he hasn't been lately, due to transportation and it was difficult for him to learn new/complex songs. He is involved in a non-profit foundation he helped create locally, [employer], for brain tumor patients, and he derives great meaning from doing this. He is also planning to start exercising at a local health club soon.  He reported his appetite is good. He has not had any hallucinations. He denied past or present suicidal ideation or intention. He noted he is a very optimistic person and has strong faith.  No imminent risk of self-harm was identified.   Social History: Mr. Barretta was born and raised in [place]. He and his mother report he was a very active, perhaps "hyper" child but this did not interfere with his social or academic functioning. He was an excellent student, according to his mother. 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 Mr. Budny was previously married; he has been divorced for about 13 years. He has no children. He consumes minimal alcohol (one beer occasionally). He was never a smoker or tobacco user.    Medical History: Past Medical History:  Diagnosis Date  . Brain cancer ([place])   . Seizures ([place])   . Thyroid disease    Hypothyroid  The patient reported he was diagnosed with Hashimoto's thyroiditis    Current Medications:  Outpatient Encounter Prescriptions as of 10/14/2016  Medication Sig  . acetaminophen (TYLENOL) 650 MG CR tablet Take 1,300 mg by mouth every 8 (eight) hours as needed for pain.  Marland Kitchen escitalopram (LEXAPRO) 20 MG tablet Take 20 mg by mouth daily.   . fluticasone (FLONASE) 50 MCG/ACT nasal spray   . lamoTRIgine (LAMICTAL) 150 MG tablet Take 2 tablets (300 mg total) by mouth 2 (two) times daily.  . Thyroid (NATURE-THROID PO) Take 225 mg by mouth.   No facility-administered encounter medications on file as of 10/14/2016.      Behavioral Observations:   Appearance: Casually and appropriately dressed and groomed Gait: Ambulated independently, mild unsteadiness Speech: Fluent; normal rate, rhythm and volume. Occasional word finding difficulty and occasional mispronunciation of words. Some reduced comprehension at times (responding to questions with unrelated topics/information) Thought process: Mildly disorganized  at times. Repeats himself somewhat frequently. Affect: Full, euthymic, jovial Interpersonal: Pleasant, appropriate   TESTING: There is medical necessity to proceed with neuropsychological assessment as the results will be used to aid in differential diagnosis and clinical decision-making and to inform specific treatment recommendations. This patient has a history of brain cancer, frontal lobe resection, whole brain radiation, and seizures. Per the patient, his mother and medical records reviewed, there has  been a more recent decline in cognitive functioning which may be due to longterm effects of whole brain radiation or other etiology.   PLAN: The patient will return for a full battery of neuropsychological testing with a psychometrician under my supervision. Education regarding testing procedures was provided. Subsequently, the patient will see this provider for a follow-up session at which time his test performances and my impressions and treatment recommendations will be reviewed in detail.   Full neuropsychological evaluation report to follow.

## 2016-10-15 NOTE — Progress Notes (Signed)
I have reviewed and agreed above plan. 

## 2016-10-18 ENCOUNTER — Telehealth: Payer: Self-pay | Admitting: *Deleted

## 2016-10-18 DIAGNOSIS — G40909 Epilepsy, unspecified, not intractable, without status epilepticus: Secondary | ICD-10-CM

## 2016-10-18 NOTE — Telephone Encounter (Signed)
Received note from MOM with several concerns.  Since Howard White was seen on the 28th of November he has fallen on the treadmill.  Feels his increase in medication is causing this problem he went from 150 mg of lamotrigine twice daily to 300 mg twice daily. Discussed with Dr. Krista Blue. Decrease morning dose to 150 and continue night dose of 300 mg. The medication was increased due to what sounded like seizure activity of his right arm. Call to mom. Made her aware of dose change and need to get lamotrigine  level done in 10 days. Also made her aware that  Howard White should not be on a treadmill and we are concerned that he does not make good decisions and concerned that he continues to live at home. She says she and  her husband are aware that he probably needs to come and live with them.

## 2016-10-18 NOTE — Telephone Encounter (Signed)
Received letter with concerns on lamictal.  Placed in in box.

## 2016-10-28 ENCOUNTER — Ambulatory Visit (INDEPENDENT_AMBULATORY_CARE_PROVIDER_SITE_OTHER): Payer: Medicare Other | Admitting: Psychology

## 2016-10-28 DIAGNOSIS — C711 Malignant neoplasm of frontal lobe: Secondary | ICD-10-CM

## 2016-10-28 DIAGNOSIS — G40209 Localization-related (focal) (partial) symptomatic epilepsy and epileptic syndromes with complex partial seizures, not intractable, without status epilepticus: Secondary | ICD-10-CM

## 2016-10-28 DIAGNOSIS — R413 Other amnesia: Secondary | ICD-10-CM

## 2016-10-28 NOTE — Progress Notes (Signed)
   Neuropsychology Note  Howard White returned today for 2 hours of neuropsychological testing with technician, Milana Kidney, BS, under the supervision of Dr. Macarthur Critchley. The patient did not appear overtly distressed by the testing session, per behavioral observation or via self-report to the technician. Rest breaks were offered. Howard White will return within 2 weeks for a feedback session with Dr. Si Raider at which time his test performances, clinical impressions and treatment recommendations will be reviewed in detail. The patient understands he can contact our office should he require our assistance before this time.  Full report to follow.

## 2016-11-01 ENCOUNTER — Other Ambulatory Visit (INDEPENDENT_AMBULATORY_CARE_PROVIDER_SITE_OTHER): Payer: Self-pay

## 2016-11-01 DIAGNOSIS — Z0289 Encounter for other administrative examinations: Secondary | ICD-10-CM

## 2016-11-01 DIAGNOSIS — G40909 Epilepsy, unspecified, not intractable, without status epilepticus: Secondary | ICD-10-CM | POA: Diagnosis not present

## 2016-11-02 LAB — LAMOTRIGINE LEVEL: Lamotrigine Lvl: 10.5 ug/mL (ref 2.0–20.0)

## 2016-11-02 NOTE — Progress Notes (Signed)
NEUROPSYCHOLOGICAL EVALUATION   Name:    Howard White  Date of Birth:   1961/01/03 Date of Interview:  10/14/2016 Date of Testing:  10/28/2016   Date of Feedback:  11/04/2016       Background Information:  Reason for Referral:  Howard White is a 55 y.o., right-handed male referred by Dr. Krista Blue and Dennie Bible, NP, of [facility] to assess his current level of cognitive functioning and assist in differential diagnosis. The current evaluation consisted of a review of available medical records, an interview with the patient and his mother, and the completion of a neuropsychological testing battery. Informed consent was obtained.  History of Presenting Problem:  Howard White has a history of left frontal anaplastic astrocytoma grade 3, discovered after he had a grand mal seizure in late 2000 or early 2001. There were no cognitive signs or symptoms prior to the diagnosis, per the patient and his mother. He underwent resection in March 2001 as well as chemotherapy and whole brain radiation. He reported that he was told he had 2-3 years to live. The patient was previously prescribed Keppra but this was switched to lamotrigine as he had been reporting mood instability/depression. Lamotrigine dose was recently doubled as there was concern about seizures involving his right hand (involuntary movements, dropping items held in his right hand without realizing it, loss of control of right hand). The patient is followed by Dr. Krista Blue and was most recently seen on 10/12/2016; he scored 24/30 on the MMSE at that time.   MRI of the brain (with and without contrast) completed on 05/03/2016 reportedly revealed the following: 1. Large cystic resection cavity in the left frontal lobe, unchanged when compared to the MRI of the brain from 07/15/2014 representing the prior resection. 2. Generalized white matter T2/FLAIR hyperintense signal changes that might represent previous whole brain  radiation. 3. There is a normal enhancement pattern. 4. There are no acute findings.  The patient and his mother report significant short term memory loss after resection of his tumor. They do feel it has worsened over time or more recently. He continues to live independently (he lives one mile from his parents and from his brother) but he stopped driving due to the possible seizure activity. His family assists him with all instrumental ADLs.  He and his mother report forgetfulness for recent conversations/events, repeating the same stories, misplacing/losing items, forgetting appointments, starting but not finishing tasks, word finding difficulty, and word substitutions. They denied missing medication doses; he is compliant with his medications and his mother assists in organizing these and providing reminders when changes are made to his medications.   In terms of behavioral symptoms, the patient's mother reports that he sometimes uses a loud voice without realizing it. He may be in the middle of ordering food, for example, and will loudly say "No!". He has some trouble communicating in that he will begin a conversation "in the middle" or "go out of order".  His mother feels his mood has been more stable since switching to Lamotrigine but the patient continues to report some concerns about depression.  The patient and his mother note that he has demonstrated gait changes in the past several months with some falls. Howard White also reports he is particularly concerned about declining energy levels over the past two years. He has to take a daytime nap.   The patient does have a history of possible concussions; he played contact sports throughout high school and he  attended college on a full football scholarship. 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 He was never treated for a head injury. His family did not notice any cognitive or behavioral changes after any of the hits/possible  concussions.  The patient denied psychiatric history prior to his brain cancer. He denied history of substance abuse or dependence.  Howard White reported that he was diagnosed with Hashimoto's disease in the past. He takes Nature-Throid instead of synthroid. He expressed concern that Hashimoto's could be influencing his energy level.  The patient has had neuropsychological evaluations in the past. He believes his most recent evaluation was 2-3 years ago; however, it was not completed in the [facility] system and his family does not have the report.   Current Functioning: As noted previously, the patient lives independently. He is on long term disability. He receives assistance with instrumental ADLs. His family provides transportation. His mother assists with organization of his medications. His father assists with management of his finances (in particular, the patient goes to him before making any large purchases). His mother assists with appointments and schedules. He does not do any cooking as there is concern about leaving burners on.  The patient does not have a regular routine other than eating lunch with his brother most days. He does not have a set sleep schedule. He prefers to stay up late. He used to be involved in a singing group but he hasn't been lately, due to transportation and it was difficult for him to learn new/complex songs. He is involved in a non-profit foundation he helped create locally, [employer], for brain tumor patients, and he derives great meaning from doing this. He is also planning to start exercising with his brother at a local health club soon.  He reported his appetite is good. He has not had any hallucinations. He denied past or present suicidal ideation or intention. He noted he is a very optimistic person and has strong faith.  No imminent risk of self-harm was identified.   Social History: Howard White was born and raised in [place].  He and his mother report he was a very active, perhaps "hyper" child but this did not interfere with his social or academic functioning. He was an excellent student, according to his mother. 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 Howard White was previously married; he has been divorced for about 13 years. He has no children. He consumes minimal alcohol (one beer occasionally). He was never a smoker or tobacco user.    Medical History:  Past Medical History:  Diagnosis Date  . Brain cancer ([place])   . Seizures ([place])   . Thyroid disease    Hypothyroid    Current medications:  Outpatient Encounter Prescriptions as of 11/04/2016  Medication Sig  . acetaminophen (TYLENOL) 650 MG CR tablet Take 1,300 mg by mouth every 8 (eight) hours as needed for pain.  Marland Kitchen escitalopram (LEXAPRO) 20 MG tablet Take 20 mg by mouth daily.   . fluticasone (FLONASE) 50 MCG/ACT nasal spray   . lamoTRIgine (LAMICTAL) 150 MG tablet Take 2 tablets (300 mg total) by mouth 2 (two) times daily. (Patient taking differently: Take 150 mg by mouth 2 (two) times daily. 125m in the am 3092mat night)  . Thyroid (NATURE-THROID PO) Take 225 mg by mouth.   No facility-administered encounter medications on file as of 11/04/2016.  Current Examination:  Behavioral Observations:  Appearance: Casually and appropriately dressed and groomed Gait: Ambulated independently, mild unsteadiness Speech: Fluent; normal rate, rhythm and volume. Occasional word finding difficulty and occasional mispronunciation of words. Some reduced comprehension at times (responding to questions with unrelated topics/information) Thought process: Mildly disorganized at times. Repeats himself somewhat frequently. Affect: Full, euthymic, jovial Interpersonal: Pleasant, appropriate Orientation: Oriented to person, place and time. Accurately stated the current President but  inaccurately named his predecessor ("Bush").  Tests Administered: . Test of Premorbid Functioning (TOPF) . Wechsler Adult Intelligence Scale-Fourth Edition (WAIS-IV): Similarities, Information, Block Design, Matrix Reasoning, Arithmetic, Symbol Search, Coding and Digit Span subtests . [place] Verbal Learning Test - 2nd Edition (CVLT-2) Standard Form . [employer] (WCST) . Repeatable Battery for the Assessment of Neuropsychological Status (RBANS) Form A:  Figure Copy and Figure Recall, Story Memory and Recall, and Line Orientation Subtests . Neuropsychological Assessment Battery (NAB) Language Module, Form 1:  Auditory Comprehension and Naming Subtests . Controlled Oral Word Association Test (COWAT) . Trail Making Test A and B . Symbol Digit Modalities Test (SDMT) . Boston Diagnostic Aphasia Examination (BDAE): Complex Ideational Material Subtest . Clock Drawing Test . Beck Depression Inventory - Second edition (BDI-II) . Generalized Anxiety Disorder - 7 item screener (GAD-7)  Test Results: Note: Standardized scores are presented only for use by appropriately trained professionals and to allow for any future test-retest comparison. These scores should not be interpreted without consideration of all the information that is contained in the rest of the report. The most recent standardization samples from the test publisher or other sources were used whenever possible to derive standard scores; scores were corrected for age, gender, ethnicity and education when available.   Test Scores:  Test Name Raw Score Standardized Score Descriptor  TOPF 49/70 SS= 106 Average  WAIS-IV Subtests     Similarities 28/36 ss= 11 Average  Information 15/26 ss= 10 Average  Block Design 12/66 ss= 4 Impaired  Matrix Reasoning 8/26 ss= 6 Low average  Arithmetic 7/22 ss= 4 Impaired  Symbol Search 25/60 ss= 9 Average  Coding 41/135 ss= 6 Low average  Digit Span 20/48 ss= 7 Low average  WAIS-IV  Index Scores     Verbal Comprehension  SS= 103 Average  Perceptual Reasoning  SS= 71 Borderline  Working Memory  SS= 74 Borderline  Processing Speed  SS= 86 Low average  Full Scale IQ (8 subtest)  SS= 80 Low average  CVLT-II Scores     Trial 1 3/16 Z= -2.5 Impaired  Trial 5 6/16 Z= -2 Impaired  Trials 1-5 total 25/80 T= 31 Borderline  SD Free Recall 3/16 Z= -2 Impaired  SD Cued Recall 9/16 Z= -0.5 Average  LD Free Recall 5/16 Z= -1 Low average  LD Cued Recall 5/16 Z= -1.5 Borderline  Recognition Discriminability 13/16 hits, 9 false positives Z= -1.5 Borderline  Forced Choice Recognition 16/16  WNL  WCST     Total Errors 42 T= 22 Severely impaired  Perseverative Responses 28 T= <20 Severely impaired  Perseverative Errors 26 T= <20 Severely impaired  Conceptual Level Responses 10 T= 21 Severely impaired  Categories Completed 1 6-10%   Trials to Complete 1st Category 37 6-10%   Failure to Maintain Set 0    RBANS Subtest     Figure Copy 11/20 Z= -5.1 Severely impaired  Figure Recall 5/20 Z= -2.6 Severely impaired  Story Memory 12/24 Z= -1.5 Borderline  Story Recall 7/12 Z= -1 Low average  Line Orientation 15/20 Z= -0.5 Average  NAB Language Subtests     Auditory Comprehension 87/89 T= 49 Average  Naming 28/31 T= 29 Impaired  COWAT-FAS 23 T= 31 Borderline   COWAT-Animals 13 T= 34 Borderline  Trail Making Test A  74" 1 error T= 13 Severely impaired  Trail Making Test B  205" 2 errors T= -3.4 Severely impaired  SDMT - Oral 21/110 -3.7 Severely impaired  BDAE Subtest     Complex Ideational Material 11/12    Clock Drawing Test   Impaired  BDI-II 2/63  WNL  GAD-7 0/21  WNL     Description of Test Results:  Premorbid verbal intellectual abilities were estimated to have been within the average range based on a test of word reading, which may be an underestimate based on his educational and occupational history. His current full scale IQ fell in the low average range overall,  just one point shy of borderline range. This represents a significant decline relative to premorbid intellectual functioning. In analyzing the four indexes that comprise the full scale IQ, Howard White demonstrated relative weaknesses in perceptual reasoning and working memory, with a relative strength in verbal comprehension. This profile is somewhat surprising given his history of left (rather than right) hemisphere lesion/resection.   Psychomotor processing speed was low average overall; reduced strength/fine motor control in his right hand likely contributed to this. Auditory attention and working memory were borderline impaired overall. Visual-spatial construction was impaired to severely impaired. Visual-spatial perception was intact on a line orientation task. Language abilities were variable. Specifically, confrontation naming was impaired for his age (although he only missed 3/31 items), and semantic verbal fluency was borderline impaired. Auditory comprehension was average. With regard to verbal memory, encoding and acquisition of non-contextual information (i.e., word list) was borderline impaired across five learning trials. After an interference task, free recall was impaired (3/16 words recalled). With semantic cueing, however, recall improved to the average range (9/16 words recalled). After a delay, free recall was low average (5/16 words recalled). Semantic cueing did not benefit him this time, and cued recall was borderline impaired (5/16 items recalled). Performance on a yes/no recognition task demonstrated significantly elevated number of false positive responses and was borderline impaired overall. On another verbal memory test, encoding and acquisition of contextual auditory information (i.e., short story) was borderline impaired. After a delay, free recall was low average. With regard to non-verbal memory, delayed free recall of visual information was severely impaired. Executive functioning  was below expectation overall. Mental flexibility and set-shifting were severely impaired on Trails B. Verbal fluency with phonemic search restrictions was borderline impaired. Verbal abstract reasoning was average. Non-verbal abstract reasoning was low average. Deductive reasoning and problem solving were severely impaired. Performance on a clock drawing task was impaired due to incorrect time placement (with indication of stimulus-boundness).   On self-report questionnaires, the patient's responses were not indicative of clinically significant depression or anxiety at the present time.    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 Results of the current evaluation are clearly abnormal. There are significant deficits noted in auditory attention and working memory, Control and instrumentation engineer, Oceanographer, memory encoding, and multiple aspects of executive functioning including mental flexibility/set-shifting, verbal fluency, and deductive reasoning/problem-solving. Additionally, there is evidence that these cognitive deficits are significantly interfering with the patient's ability to manage complex ADLs, such as appointments, medication changes, and meal preparation. As such, diagnostic criteria for major  neurocognitive disorder (ie dementia) are met.  Many of the patient's deficits are clearly longstanding and related to his history of brain cancer and treatment for brain cancer (left frontal lobe resection, whole brain radiation). Unfortunately, no previous neuropsychological testing results are available to me for comparison, so I cannot say if there has been an appreciable decline in cognitive function in recent years, but this is possible. Research indicates that brain cancer survivors who underwent whole brain radiation can experience increased cognitive impairment even years afterwards. Also, if the patient's  seizures are not currently controlled, that could be contributing to increased cognitive dysfunction as well. Fortunately the patient is not reporting significant depression or anxiety at the present time.   Recommendations/Plan: Based on the findings of the present evaluation, the following recommendations are offered:  To function at his best, I think Howard White needs increased structure in his daily life as well as increased supervision and assistance with complex ADLs. His parents are his primary caregivers presently, and an alternative plan should be considered as and both the patient and his parents continue aging. I think Howard White would function optimally in an environment where he is living with other people, has assistance with daily complex ADLs (medications, meals, appointments, finances, transportation), keeps a regular schedule of activities, and is involved in meaningful activities (such as his non-profit or other volunteer opportunities). Based on the level of cognitive impairment on current testing, including on tests highly correlated with driving ability, I do not think he should return to driving. I am concerned that if he stays living alone, he will have less opportunity for socialization/stimulation, he will not keep a regular routine (he is not currently), and he will make unsafe/poor decisions or have difficulty responding to novel situations/problems.   Howard White is very interested in participating in physical exercise/strengthening. Of course, increased activity is overall encouraged, but this needs to be done safely given his neurologic symptoms and physical and cognitive limitations. I recommended that the patient speak with Dr. Krista Blue about the possibility of physical therapy, given his newer symptoms of right side weakness, imbalance and gait changes.  If a significant decline in cognitive functioning is observed or reported at any time in the future, Howard White may be  referred back to me for repeat neuropsychological evaluation.   Feedback to Patient: Grayson Pfefferle and his mother returned for a feedback appointment on 11/04/2016 to review the results of his neuropsychological evaluation with this provider. 45 minutes face-to-face time was spent reviewing his test results, my impressions and my recommendations as detailed above.    Total time spent on this patient's case: [id number] unit for interview with psychologist; [phone] units of testing by psychometrician under psychologist's supervision; [phone] units for medical record review, scoring of neuropsychological tests, interpretation of test results, preparation of this report, and review of results to the patient by psychologist.      Thank you for your referral of Nester Bachus. Please feel free to contact me if you have any questions or concerns regarding this report.

## 2016-11-04 ENCOUNTER — Ambulatory Visit (INDEPENDENT_AMBULATORY_CARE_PROVIDER_SITE_OTHER): Payer: Medicare Other | Admitting: Psychology

## 2016-11-04 ENCOUNTER — Encounter: Payer: Self-pay | Admitting: Psychology

## 2016-11-04 DIAGNOSIS — G40209 Localization-related (focal) (partial) symptomatic epilepsy and epileptic syndromes with complex partial seizures, not intractable, without status epilepticus: Secondary | ICD-10-CM

## 2016-11-04 DIAGNOSIS — F0390 Unspecified dementia without behavioral disturbance: Secondary | ICD-10-CM

## 2016-11-04 DIAGNOSIS — C719 Malignant neoplasm of brain, unspecified: Secondary | ICD-10-CM

## 2016-11-04 DIAGNOSIS — F0280 Dementia in other diseases classified elsewhere without behavioral disturbance: Secondary | ICD-10-CM

## 2016-11-18 ENCOUNTER — Other Ambulatory Visit: Payer: Self-pay | Admitting: *Deleted

## 2016-11-18 ENCOUNTER — Telehealth: Payer: Self-pay | Admitting: *Deleted

## 2016-11-18 DIAGNOSIS — R531 Weakness: Secondary | ICD-10-CM

## 2016-11-18 NOTE — Telephone Encounter (Signed)
-----   Message from Dennie Bible, NP sent at 11/18/2016  9:16 AM EST ----- Good level of Lamictal continue current dose. Please call the Mom

## 2016-11-18 NOTE — Telephone Encounter (Signed)
Referral placed [facility].

## 2016-11-18 NOTE — Telephone Encounter (Signed)
Order PT for right sided weakness

## 2016-11-18 NOTE — Telephone Encounter (Signed)
I called and LMVM for pt on [phone] # (that lamictal level was good).  DPR stated only pt and this #).

## 2016-11-18 NOTE — Telephone Encounter (Signed)
Did speak to mother of pt.  Relayed good level of lamictal.  She stated they had seen Neuropsych and they were to recommend some type of PT.  Seen recommendation in her 11/04/16 ofv note.

## 2016-11-22 ENCOUNTER — Ambulatory Visit: Payer: Medicare Other | Attending: Nurse Practitioner | Admitting: Physical Therapy

## 2016-11-22 ENCOUNTER — Encounter: Payer: Self-pay | Admitting: Physical Therapy

## 2016-11-22 DIAGNOSIS — R2689 Other abnormalities of gait and mobility: Secondary | ICD-10-CM | POA: Diagnosis not present

## 2016-11-22 DIAGNOSIS — M6281 Muscle weakness (generalized): Secondary | ICD-10-CM | POA: Diagnosis not present

## 2016-11-22 DIAGNOSIS — R2681 Unsteadiness on feet: Secondary | ICD-10-CM | POA: Insufficient documentation

## 2016-11-22 NOTE — Therapy (Signed)
[facility] [address] [place], [place], [address] Phone: [phone]   Fax:  [phone]  Physical Therapy Evaluation  Patient Details  Name: Howard White MRN: [id number] Date of Birth: 1961-07-15 Referring Provider: Cecille Rubin, NP  Encounter Date: 11/22/2016      PT End of Session - 11/22/16 1858    Visit Number 1  G1   Number of Visits 9   Date for PT Re-Evaluation 12/22/16   Authorization Type Medicare   Authorization Time Period 11-22-16 - 01-19-17   PT Start Time 1019   PT Stop Time 1101   PT Time Calculation (min) 42 min      Past Medical History:  Diagnosis Date  . Brain cancer ([place])   . Seizures ([place])   . Thyroid disease    Hypothyroid    Past Surgical History:  Procedure Laterality Date  . brain cancer    . BRAIN SURGERY      There were no vitals filed for this visit.       Subjective Assessment - 11/22/16 1331    Subjective Pt reports gait is now much improved since change in medication (states he is now taking Lamictal so that it stays in his system constantly) ; pt reports he unconsciously will drop things from R hand; reports that gait issues have mostly resolved within past 4-6 weeks                                                                                                                                            Pertinent History Pt had brain surgery 17 years ago for astrocytoma; had seizure in June 2017 which resulted in R sided weakness; epilepsy   Patient Stated Goals Improve strength on R side    Currently in Pain? No/denies            [facility] PT Assessment - 11/22/16 1026      Assessment   Medical Diagnosis R sided weakness due to seizure due to s/p brain tumor   Referring Provider Cecille Rubin, NP   Onset Date/Surgical Date --  June 2017   Hand Dominance Right     Precautions   Precautions Other (comment)  No driving     Balance Screen   Has the patient  fallen in the past 6 months No   Has the patient had a decrease in activity level because of a fear of falling?  No   Is the patient reluctant to leave their home because of a fear of falling?  No     Home Environment   Living Environment Private residence   Type of Home Other(Comment)   Home Access Level entry   [place] Two level;Full bath on main level;Able to live on main level with bedroom/bathroom     Prior Function   Level of Independence Independent  Posture/Postural Control   Posture/Postural Control Postural limitations   Posture Comments Pt leans to R side with R shoulder lower than L shoulder     Ambulation/Gait   Ambulation/Gait Yes   Ambulation/Gait Assistance 6: Modified independent (Device/Increase time)   Ambulation Distance (Feet) 100 Feet   Assistive device None   Gait Pattern Within Functional Limits;Lateral trunk lean to right   Gait velocity 9.22 secs = 3.56 ft/sec   Stairs Yes   Stairs Assistance 5: Supervision   Stair Management Technique No rails   Number of Stairs 4   Height of Stairs 6     Balance   Balance Assessed Yes     Standardized Balance Assessment   Standardized Balance Assessment Timed Up and Go Test     Timed Up and Go Test   Normal TUG (seconds) 10.72      Lt hip abductors 4/5:  Rt hip abdct.5/5  Lt hip extensors 5/5:  R hip ext. 4/5  LLE SLS 6.47 secs:  RLE SLS 16.59  R grip strength = 100, 88,91 secs = 93# average Rt grip strength;  Lt 82, 100,100= 94# average Lt grip strength   Tandem stance = 30 secs with no LOB Rt foot in front; 2 LOB occurrences with Lt foot in front                PT Education - 11/22/16 1406    Education provided Yes   Education Details Informed pt of postural assymetry - pt leans to R side - recommended to pt that he use mirror at home for feedback    Person(s) Educated Patient   Methods Explanation;Demonstration   Comprehension Verbalized understanding;Returned demonstration              PT Long Term Goals - 11/22/16 1859      PT LONG TERM GOAL #1   Title Pt will perform LLE SLS for >/= 10 secs to demo improved balance. 12-24-15   Baseline Lt SLS 6.47 secs   Time 4   Period Weeks   Status New     PT LONG TERM GOAL #2   Title Increase gait velocity to >/= 3.9 ft/sec without device for incr. gait efficiency.  12-24-15   Baseline 9.22 secs = 3.56 ft/sec   Time 4   Period Weeks   Status New     PT LONG TERM GOAL #3   Title Pt will amb. 250' with min. posture deviations, i.e. min to no lateral Rt trunk lean. 12-24-15   Baseline Pt leans significantly to right side with R lateral trunk shortening   Time 4   Period Weeks   Status New     PT LONG TERM GOAL #4   Title Pt will be able to jog 120' on flat, even surface to demo improved balance and lower extremity strength.  12-24-15   Time 4   Period Weeks   Status New               Plan - 11/22/16 1410    Clinical Impression Statement Pt is a 56 year old gentleman s/p brain surgery for astrocytoma 17 years ago.  Pt reports he had a seizure on 11-08-12 and did not have another one until mid June 2017 when he was driving.  Pt reports gradual progressive weakening of R side after this seizure and also reports dropping items out of R hand at times.  He reports he was having significant gait difficulty approx. 3  months ago but states this has resolved since medication (Lamictal) has been changed to taking 1 in AM and 2 at night.  Pt presents with postural assymetry with pt leaning to R side.                                                                                                                 Rehab Potential Good   PT Frequency 2x / week   PT Duration 4 weeks   PT Treatment/Interventions ADLs/Self Care Home Management;Gait training;Stair training;Therapeutic activities;Therapeutic exercise;Balance training;Neuromuscular re-education;Patient/family education   PT Next Visit Plan initiate HEP for LE  strengthening; begin postural retraining exercises   PT Home Exercise Plan R hip extension, L hip abduction strengthening; posture retraining   Consulted and Agree with Plan of Care Patient      Patient will benefit from skilled therapeutic intervention in order to improve the following deficits and impairments:  Abnormal gait, Decreased balance, Decreased strength, Postural dysfunction, Impaired UE functional use  Visit Diagnosis: Muscle weakness (generalized) - Plan: PT plan of care cert/re-cert  Other abnormalities of gait and mobility - Plan: PT plan of care cert/re-cert  Unsteadiness on feet - Plan: PT plan of care cert/re-cert      G-Codes - 0000000 [id number]    Functional Assessment Tool Used TUG score 10.72 secs:  Gait velocity 3.56 ft/sec; posture assymetry noted with R lateral trunk lean   Functional Limitation Mobility: Walking and moving around   Mobility: Walking and Moving Around Current Status [id number]) At least 20 percent but less than 40 percent impaired, limited or restricted   Mobility: Walking and Moving Around Goal Status [phone]) At least 1 percent but less than 20 percent impaired, limited or restricted       Problem List Patient Active Problem List   Diagnosis Date Noted  . Gait disorder 10/12/2016  . Memory loss 10/12/2016  . Epilepsy ([place]) 03/30/2013  . Malignant neoplasm of brain ([place]) 03/30/2013  . Astrocytoma brain tumor ([facility]) 03/27/2012    Cristobal Advani, [place], PT 11/22/2016, 7:18 PM  [facility] [address] [place], [place], [address] Phone: [phone]   Fax:  [phone]  Name: British Lender MRN: [id number] Date of Birth: 08-May-1961

## 2016-11-30 ENCOUNTER — Ambulatory Visit: Payer: Medicare Other | Admitting: Physical Therapy

## 2016-12-02 ENCOUNTER — Ambulatory Visit: Payer: Medicare Other | Admitting: Physical Therapy

## 2016-12-06 ENCOUNTER — Ambulatory Visit: Payer: Medicare Other | Admitting: Physical Therapy

## 2016-12-06 DIAGNOSIS — R2689 Other abnormalities of gait and mobility: Secondary | ICD-10-CM

## 2016-12-06 DIAGNOSIS — M6281 Muscle weakness (generalized): Secondary | ICD-10-CM

## 2016-12-06 DIAGNOSIS — R2681 Unsteadiness on feet: Secondary | ICD-10-CM | POA: Diagnosis not present

## 2016-12-06 NOTE — Therapy (Signed)
[facility] [address] [place], [place], [address] Phone: [phone]   Fax:  [phone]  Physical Therapy Treatment  Patient Details  Name: Howard White MRN: [id number] Date of Birth: April 15, 1961 Referring Provider: Cecille Rubin, NP  Encounter Date: 12/06/2016      PT End of Session - 12/07/16 1415    Visit Number 2  G2   Number of Visits 9   Date for PT Re-Evaluation 12/22/16   Authorization Type Medicare   Authorization Time Period 11-22-16 - 01-19-17   PT Start Time 0801   PT Stop Time 0846   PT Time Calculation (min) 45 min      Past Medical History:  Diagnosis Date  . Brain cancer ([place])   . Seizures ([place])   . Thyroid disease    Hypothyroid    Past Surgical History:  Procedure Laterality Date  . brain cancer    . BRAIN SURGERY      There were no vitals filed for this visit.      Subjective Assessment - 12/07/16 1402    Subjective "I need to work on my gait"; pt reports he has ridden bike at gym - a couple of weeks ago (due to having been sick and then the weather)   Pertinent History Pt had brain surgery 17 years ago for astrocytoma; had seizure in June 2017 which resulted in R sided weakness; epilepsy   Patient Stated Goals Improve strength on R side    Currently in Pain? No/denies               [facility] Adult PT Treatment/Exercise - 12/07/16 0001      Ambulation/Gait   Ambulation/Gait Yes   Ambulation/Gait Assistance 5: Supervision   Ambulation/Gait Assistance Details cues given for upright posture with less trunk lean to right side   Ambulation Distance (Feet) 200 Feet   Assistive device None   Gait Pattern Within Functional Limits;Lateral trunk lean to right;Decreased arm swing - left;Decreased arm swing - right  mirror used for visual feedback     Exercises   Exercises Lumbar;Knee/Hip;Ankle     Lumbar Exercises: Machines for Strengthening   Leg Press bil. lower extremities  70# 15 reps;  RLE only 40# 20x: LLE only 40# 20 reps     Lumbar Exercises: Sidelying   Clam 20 reps  5# 20 reps   Hip Abduction 20 reps  RLE 3#   Hip Abduction Weights (lbs) 3     Lumbar Exercises: Prone   Straight Leg Raise 10 reps  RLE - pillow under hips   Other Prone Lumbar Exercises Rt hip extension with knee flexed - no weight used     Lumbar Exercises: Quadruped   Opposite Arm/Leg Raise Right arm/Left leg;Left arm/Right leg;5 reps  min assist needed for stabilization     Ankle Exercises: Standing   Heel Raises 20 reps             Balance Exercises - 12/07/16 1413      Balance Exercises: Standing   Other Standing Exercises Pt performed Rt hip flexion with knee flexed, knee extension then hip extension with foot plantarflexed 10 reps on each leg    Pt performed jumping inside // bars (no UE support needed) x 5 reps;  Single limb hopping -5 reps RLE and 5 reps LLE Pt jumped forward and backward 5 reps inside bars, but no LOB occurred requiring pt to use bars for UE support  Tandem stance 30  sec hold with CGA - each position      PT Education - 12/07/16 1414    Education provided Yes   Education Details Reviewed gait pattern with use of mirror for visual feeback for incr. postural symmetry   Person(s) Educated Patient   Methods Explanation;Demonstration   Comprehension Verbalized understanding;Returned demonstration             PT Long Term Goals - 11/22/16 1859      PT LONG TERM GOAL #1   Title Pt will perform LLE SLS for >/= 10 secs to demo improved balance. 12-24-15   Baseline Lt SLS 6.47 secs   Time 4   Period Weeks   Status New     PT LONG TERM GOAL #2   Title Increase gait velocity to >/= 3.9 ft/sec without device for incr. gait efficiency.  12-24-15   Baseline 9.22 secs = 3.56 ft/sec   Time 4   Period Weeks   Status New     PT LONG TERM GOAL #3   Title Pt will amb. 250' with min. posture deviations, i.e. min to no lateral Rt trunk lean.  12-24-15   Baseline Pt leans significantly to right side with R lateral trunk shortening   Time 4   Period Weeks   Status New     PT LONG TERM GOAL #4   Title Pt will be able to jog 120' on flat, even surface to demo improved balance and lower extremity strength.  12-24-15   Time 4   Period Weeks   Status New               Plan - 12/07/16 1416    Clinical Impression Statement Pt presents with high level balance deficits with continued postural asymmetry in gait - improved with use of mirror for feedback;  RLE is weaker than LLE, with pt havng difficulty with RLEclearing floor in single limb hopping   Rehab Potential Good   PT Frequency 2x / week   PT Duration 4 weeks   PT Treatment/Interventions ADLs/Self Care Home Management;Gait training;Stair training;Therapeutic activities;Therapeutic exercise;Balance training;Neuromuscular re-education;Patient/family education   PT Next Visit Plan give pictures for R hip abductor and R hip extensor strengthening for HEP; quadruped exercise; cont strengthening   PT Home Exercise Plan R hip extension, L hip abduction strengthening; posture retraining   Consulted and Agree with Plan of Care Patient      Patient will benefit from skilled therapeutic intervention in order to improve the following deficits and impairments:  Abnormal gait, Decreased balance, Decreased strength, Postural dysfunction, Impaired UE functional use  Visit Diagnosis: Other abnormalities of gait and mobility  Muscle weakness (generalized)     Problem List Patient Active Problem List   Diagnosis Date Noted  . Gait disorder 10/12/2016  . Memory loss 10/12/2016  . Epilepsy ([place]) 03/30/2013  . Malignant neoplasm of brain ([place]) 03/30/2013  . Astrocytoma brain tumor ([place]) 03/27/2012    Keionna Kinnaird, [place], PT 12/07/2016, 2:22 PM  [facility] [address] [place], [place], [address] Phone: [phone]    Fax:  [phone]  Name: Howard White MRN: [id number] Date of Birth: 10/11/1961

## 2016-12-08 ENCOUNTER — Encounter: Payer: Self-pay | Admitting: Nurse Practitioner

## 2016-12-09 ENCOUNTER — Ambulatory Visit: Payer: Medicare Other | Admitting: Physical Therapy

## 2016-12-09 DIAGNOSIS — R2681 Unsteadiness on feet: Secondary | ICD-10-CM

## 2016-12-09 DIAGNOSIS — M6281 Muscle weakness (generalized): Secondary | ICD-10-CM

## 2016-12-09 DIAGNOSIS — R2689 Other abnormalities of gait and mobility: Secondary | ICD-10-CM | POA: Diagnosis not present

## 2016-12-09 NOTE — Patient Instructions (Addendum)
Hip Flexion / Knee Extension: Straight-Leg Raise (Eccentric)   Lie on back. Lift leg with knee straight. Slowly lower leg for 3-5 seconds. __10_ reps per set, __2_ sets per day, _5__ days per week. Lower like elevator, stopping at each floor. Add _5__ lbs .  ABDUCTION: Side-Lying (Active) .  Lie on left side, top leg straight. Raise top leg as far as possible. Use __3_ lbs. Complete _2__ sets of _10__ repetitions. Perform _1-2__ sessions per day.  http://gtsc.exer.us/94   (Home) Extension: Hip   With support under abdomen, tighten stomach. Lift right leg in line with body. Do not hyperextend. Alternate legs. Repeat __10__ times per set. Do _2___ sets per sessions No weight used  http://gtsc.exer.us/129   Copyright  VHI. All rights reserved.   Abduction: Clam (Eccentric) - Side-Lying   Lie on side with knees bent. Lift top knee, keeping feet together. Keep trunk steady. Slowly lower for 3-5 seconds. 10 reps per set, 2 sets per day, 5 days per week. Use 5  lbs - place weight above knee  Copyright  VHI. All rights reserved.   Tandem Stance    Right foot in front of left, heel touching toe both feet "straight ahead". Stand on Foot Triangle of Support with both feet. Balance in this position _30__ seconds. Do with left foot in front of right.  Also practice walking heel to toe  Copyright  VHI. All rights reserved.  SINGLE LIMB STANCE    Stance: single leg on floor. Raise leg. Hold  10___ seconds. Repeat with other leg. __2_ reps per set, _2__ sets per day, _5__ days per week  : Do right and left leg  Copyright  VHI. All rights reserved.  Braiding    Move to side: 1) cross right leg in front of left, 2) bring back leg out to side, then 3) cross right leg behind left, 4) bring left leg out to side. Continue sequence in same direction. Reverse sequence, moving in opposite direction. Repeat sequence _3___ times per session. Do _1___ sessions per day.   Copyright   VHI. All rights reserved.

## 2016-12-10 NOTE — Therapy (Signed)
[facility] [address] [place], [place], [address] Phone: [phone]   Fax:  [phone]  Physical Therapy Treatment  Patient Details  Name: Howard White MRN: [id number] Date of Birth: 1961/07/08 Referring Provider: Cecille Rubin, NP  Encounter Date: 12/09/2016      PT End of Session - 12/10/16 0923    Visit Number 3   Number of Visits 9   Date for PT Re-Evaluation 12/22/16   Authorization Type Medicare   Authorization Time Period 11-22-16 - 01-19-17   PT Start Time 0931   PT Stop Time 1016   PT Time Calculation (min) 45 min      Past Medical History:  Diagnosis Date  . Brain cancer ([place])   . Seizures ([place])   . Thyroid disease    Hypothyroid    Past Surgical History:  Procedure Laterality Date  . brain cancer    . BRAIN SURGERY      There were no vitals filed for this visit.      Subjective Assessment - 12/10/16 0918    Subjective Pt states he went to gym and worked hard yesterday - no problems to report   Pertinent History Pt had brain surgery 17 years ago for astrocytoma; had seizure in June 2017 which resulted in R sided weakness; epilepsy   Patient Stated Goals Improve strength on R side    Currently in Pain? No/denies                         [facility] Adult PT Treatment/Exercise - 12/10/16 0001      Exercises   Exercises Lumbar;Knee/Hip;Ankle     Lumbar Exercises: Stretches   Active Hamstring Stretch 1 rep;30 seconds  bil. lower extremities     Lumbar Exercises: Aerobic   Elliptical Level 1.6 1" forward and 1" backward     Lumbar Exercises: Standing   Heel Raises 10 reps  10 reps bilateral; RLE ony 10 reps:  LLE only 10 reps     Lumbar Exercises: Sidelying   Clam 20 reps  5# 20 reps   Hip Abduction 20 reps  RLE 3#   Hip Abduction Weights (lbs) 3     Lumbar Exercises: Prone   Straight Leg Raise 10 reps  RLE - pillow under hips - no wieght   Other Prone Lumbar  Exercises Rt hip extension with knee flexed - no weight used     Lumbar Exercises: Quadruped   Opposite Arm/Leg Raise Right arm/Left leg;Left arm/Right leg;5 reps  min assist needed for stabilization     Ankle Exercises: Stretches   Gastroc Stretch 1 rep;30 seconds  each leg separately             Balance Exercises - 12/10/16 0941      Balance Exercises: Standing   Other Standing Exercises Pt instructed in braiding, tandem stance and SLS for HEP - UE support prn - pt instructed to perform exercises at counter to have as needed for safety            PT Education - 12/10/16 0923    Education provided Yes   Education Details Instructed in HEP for RLE strengthening and balance exercises   Person(s) Educated Patient   Methods Explanation;Demonstration;Handout   Comprehension Verbalized understanding;Returned demonstration             PT Long Term Goals - 11/22/16 1859      PT LONG TERM GOAL #1  Title Pt will perform LLE SLS for >/= 10 secs to demo improved balance. 12-24-15   Baseline Lt SLS 6.47 secs   Time 4   Period Weeks   Status New     PT LONG TERM GOAL #2   Title Increase gait velocity to >/= 3.9 ft/sec without device for incr. gait efficiency.  12-24-15   Baseline 9.22 secs = 3.56 ft/sec   Time 4   Period Weeks   Status New     PT LONG TERM GOAL #3   Title Pt will amb. 250' with min. posture deviations, i.e. min to no lateral Rt trunk lean. 12-24-15   Baseline Pt leans significantly to right side with R lateral trunk shortening   Time 4   Period Weeks   Status New     PT LONG TERM GOAL #4   Title Pt will be able to jog 120' on flat, even surface to demo improved balance and lower extremity strength.  12-24-15   Time 4   Period Weeks   Status New               Plan - 12/10/16 [id number]    Clinical Impression Statement Pt demonstrates slightly decreased strenght in RLE in hip musculature compared to LLE, but decr. SLS in LLE compared to RLE;  postural assymmetry decreasing with pt aware of Rt trunk lean and atttempting to self correct with increased awareness of this assymetry                                                                                             Rehab Potential Good   PT Frequency 2x / week   PT Duration 4 weeks   PT Treatment/Interventions ADLs/Self Care Home Management;Gait training;Stair training;Therapeutic activities;Therapeutic exercise;Balance training;Neuromuscular re-education;Patient/family education   PT Next Visit Plan check HEP; continue high level balance and gait and RLE strengthening; quadruped exercise   PT Home Exercise Plan R hip extension, L hip abduction strengthening; posture retraining; HEP with pictures given on 12-09-16; balance exercises included   Consulted and Agree with Plan of Care Patient      Patient will benefit from skilled therapeutic intervention in order to improve the following deficits and impairments:  Abnormal gait, Decreased balance, Decreased strength, Postural dysfunction, Impaired UE functional use  Visit Diagnosis: Unsteadiness on feet  Muscle weakness (generalized)     Problem List Patient Active Problem List   Diagnosis Date Noted  . Gait disorder 10/12/2016  . Memory loss 10/12/2016  . Epilepsy ([place]) 03/30/2013  . Malignant neoplasm of brain ([place]) 03/30/2013  . Astrocytoma brain tumor ([place]) 03/27/2012    Jenise Iannelli, [place], PT 12/10/2016, 9:44 AM  [facility] [address] [place], [place], [address] Phone: [phone]   Fax:  [phone]  Name: Howard White MRN: [id number] Date of Birth: Mar 09, 1961

## 2016-12-13 ENCOUNTER — Ambulatory Visit: Payer: Medicare Other | Admitting: Physical Therapy

## 2016-12-13 DIAGNOSIS — R2681 Unsteadiness on feet: Secondary | ICD-10-CM | POA: Diagnosis not present

## 2016-12-13 DIAGNOSIS — M6281 Muscle weakness (generalized): Secondary | ICD-10-CM

## 2016-12-13 DIAGNOSIS — R2689 Other abnormalities of gait and mobility: Secondary | ICD-10-CM | POA: Diagnosis not present

## 2016-12-14 NOTE — Therapy (Signed)
[facility] [address] [place], [place], [address] Phone: [phone]   Fax:  [phone]  Physical Therapy Treatment  Patient Details  Name: Howard White MRN: [id number] Date of Birth: [birth year] Referring Provider: Cecille Rubin, NP  Encounter Date: 12/13/2016      PT End of Session - 12/14/16 2023    Visit Number 4   Number of Visits 9   Date for PT Re-Evaluation 12/22/16   Authorization Type Medicare   Authorization Time Period 11-22-16 - 01-19-17   PT Start Time 1402   PT Stop Time 1447   PT Time Calculation (min) 45 min      Past Medical History:  Diagnosis Date  . Brain cancer ([place])   . Seizures ([place])   . Thyroid disease    Hypothyroid    Past Surgical History:  Procedure Laterality Date  . brain cancer    . BRAIN SURGERY      There were no vitals filed for this visit.      Subjective Assessment - 12/14/16 2016    Subjective Pt reports no problems - has been going to gym   Pertinent History Pt had brain surgery 17 years ago for astrocytoma; had seizure in June 2017 which resulted in R sided weakness; epilepsy   Patient Stated Goals Improve strength on R side    Currently in Pain? No/denies                         [facility] Adult PT Treatment/Exercise - 12/14/16 0001      Ambulation/Gait   Ambulation/Gait Yes   Ambulation/Gait Assistance 5: Supervision   Ambulation/Gait Assistance Details cues given for upright posture and to shift trunk toward Lt side   Ambulation Distance (Feet) 100 Feet   Assistive device None   Gait Pattern Within Functional Limits   Ambulation Surface Level;Indoor     Lumbar Exercises: Stretches   Active Hamstring Stretch 1 rep;30 seconds  bil. lower extremities     Lumbar Exercises: Aerobic   Elliptical Level 1.6 1.5" forward and 1.5" backward     Lumbar Exercises: Machines for Strengthening   Leg Press bil. lower extremities 70# 15 reps;  RLE  only 40# 20x: LLE only 40# 20 reps     Lumbar Exercises: Standing   Heel Raises 10 reps  10 reps bilateral; RLE ony 10 reps:  LLE only 10 reps     Lumbar Exercises: Sidelying   Clam 20 reps  5# 20 reps   Hip Abduction 20 reps  RLE 3#     Lumbar Exercises: Prone   Straight Leg Raise --  RLE - pillow under hips - no wieght     Lumbar Exercises: Quadruped   Opposite Arm/Leg Raise Right arm/Left leg;Left arm/Right leg;5 reps  min assist needed for stabilization     Knee/Hip Exercises: Supine   Straight Leg Raises Right;2 sets;10 reps  5# on RLE             Balance Exercises - 12/14/16 2021      Balance Exercises: Standing   Other Standing Exercises Pt performed cone taps (3 cones) with CGA; then tipping cone over and standing up with CGA      Pt performed standing on BOSU - with minimal UE support - moving each leg up/back and laterally x 10 reps for improved Single limb stance on each leg          PT  Long Term Goals - 11/22/16 1859      PT LONG TERM GOAL #1   Title Pt will perform LLE SLS for >/= 10 secs to demo improved balance. 12-24-15   Baseline Lt SLS 6.47 secs   Time 4   Period Weeks   Status New     PT LONG TERM GOAL #2   Title Increase gait velocity to >/= 3.9 ft/sec without device for incr. gait efficiency.  12-24-15   Baseline 9.22 secs = 3.56 ft/sec   Time 4   Period Weeks   Status New     PT LONG TERM GOAL #3   Title Pt will amb. 250' with min. posture deviations, i.e. min to no lateral Rt trunk lean. 12-24-15   Baseline Pt leans significantly to right side with R lateral trunk shortening   Time 4   Period Weeks   Status New     PT LONG TERM GOAL #4   Title Pt will be able to jog 120' on flat, even surface to demo improved balance and lower extremity strength.  12-24-15   Time 4   Period Weeks   Status New               Plan - 12/14/16 2024    Clinical Impression Statement Pt much improved with performing quadruped exercise of  lifting opposite UE and LE with pt being able to hold for 5 seconds demonstrating improved core stabilization.  Gait pattern is improved with use of mirror for visual feedback with pt able to self correct and minimize gait deviations.   Rehab Potential Good   PT Frequency 2x / week   PT Duration 4 weeks   PT Treatment/Interventions ADLs/Self Care Home Management;Gait training;Stair training;Therapeutic activities;Therapeutic exercise;Balance training;Neuromuscular re-education;Patient/family education   PT Next Visit Plan continue strengthening and balance exercises   PT Home Exercise Plan R hip extension, L hip abduction strengthening; posture retraining; HEP with pictures given on 12-09-16; balance exercises included   Consulted and Agree with Plan of Care Patient      Patient will benefit from skilled therapeutic intervention in order to improve the following deficits and impairments:  Abnormal gait, Decreased balance, Decreased strength, Postural dysfunction, Impaired UE functional use  Visit Diagnosis: Muscle weakness (generalized)  Other abnormalities of gait and mobility     Problem List Patient Active Problem List   Diagnosis Date Noted  . Gait disorder 10/12/2016  . Memory loss 10/12/2016  . Epilepsy ([place]) 03/30/2013  . Malignant neoplasm of brain ([place]) 03/30/2013  . Astrocytoma brain tumor ([place]) 03/27/2012    Desirey Keahey, [place], PT 12/14/2016, 8:30 PM  [facility] [address] [place], [place], [address] Phone: [phone]   Fax:  [phone]  Name: Howard White MRN: [id number] Date of Birth: June 08, 1961

## 2016-12-16 ENCOUNTER — Ambulatory Visit: Payer: Medicare Other | Attending: Nurse Practitioner | Admitting: Physical Therapy

## 2016-12-16 DIAGNOSIS — M6281 Muscle weakness (generalized): Secondary | ICD-10-CM | POA: Insufficient documentation

## 2016-12-16 DIAGNOSIS — R2689 Other abnormalities of gait and mobility: Secondary | ICD-10-CM | POA: Diagnosis not present

## 2016-12-16 DIAGNOSIS — R2681 Unsteadiness on feet: Secondary | ICD-10-CM | POA: Diagnosis not present

## 2016-12-17 NOTE — Therapy (Signed)
[facility] [address] [place], [place], [address] Phone: [phone]   Fax:  [phone]  Physical Therapy Treatment  Patient Details  Name: Howard White MRN: [id number] Date of Birth: Aug 11, 1961 Referring Provider: Cecille Rubin, NP  Encounter Date: 12/16/2016      PT End of Session - 12/17/16 1042    Visit Number 5   Number of Visits 9   Date for PT Re-Evaluation 12/22/16   Authorization Type Medicare   Authorization Time Period 11-22-16 - 01-19-17   PT Start Time 1450   PT Stop Time 1536   PT Time Calculation (min) 46 min      Past Medical History:  Diagnosis Date  . Brain cancer ([place])   . Seizures ([place])   . Thyroid disease    Hypothyroid    Past Surgical History:  Procedure Laterality Date  . brain cancer    . BRAIN SURGERY      There were no vitals filed for this visit.      Subjective Assessment - 12/17/16 1038    Subjective Pt states he is enjoying getting back to working out - still wakes up with hip muscle soreness on the days following PT   Pertinent History Pt had brain surgery 17 years ago for astrocytoma; had seizure in June 2017 which resulted in R sided weakness; epilepsy   Patient Stated Goals Improve strength on R side    Currently in Pain? No/denies                         [facility] Adult PT Treatment/Exercise - 12/17/16 0001      Lumbar Exercises: Aerobic   Elliptical Level 1.6 1.5" forward and 1.5" backward     Lumbar Exercises: Machines for Strengthening   Leg Press bil. LE's 70# 2 sets 10 reps:  RLE only 10 reps 70#:  LLE only 70# 10 reps      Lumbar Exercises: Supine   Bridge 10 reps;Non-compliant     Lumbar Exercises: Sidelying   Clam 20 reps  5# 20 reps   Hip Abduction 20 reps  RLE 3#     Lumbar Exercises: Quadruped   Opposite Arm/Leg Raise Right arm/Left leg;Left arm/Right leg;5 reps  min assist needed for stabilization     Knee/Hip Exercises:  Plyometrics   Bilateral Jumping 1 set;10 reps  over gait belt on floor; 10 reps laterally     Pt performed bridging with marching, bridging with lower extremity extension alternating, and bridging with hip abduction and Adduction x 10 reps each Pt performed SLR RLE with 5# weight x 10 reps  NeuroRe-ed:  Pt performed braiding inside // bars 10' x 4 reps with UE support prn with SBA for improved coordination  Pt performed balance activity in R and L 1/2 kneeling with CGA - with slow head turns side to side and up/down x 10 reps each   TherAct: Pt performed single limb hopping - 10 reps each leg with UE support prn LOB inside bars Pt jogged 40' x 6 reps with no LOB                  PT Long Term Goals - 12/17/16 1047      PT LONG TERM GOAL #1   Title Pt will perform LLE SLS for >/= 10 secs to demo improved balance. 12-24-15   Status New     PT LONG TERM GOAL #2  Title Increase gait velocity to >/= 3.9 ft/sec without device for incr. gait efficiency.  12-24-15   Status New     PT LONG TERM GOAL #3   Title Pt will amb. 250' with min. posture deviations, i.e. min to no lateral Rt trunk lean. 12-24-15     PT LONG TERM GOAL #4   Title Pt will be able to jog 120' on flat, even surface to demo improved balance and lower extremity strength.  12-24-15               Plan - 12/17/16 1043    Clinical Impression Statement Pt continues to have difficulty with single limb hopping with achieving complete clearance in hop; pt is demonstrating improved leg and trunk strength with increased stability noted with quadruped exercise; pt had increased stability in R 1/2 kneeling position compared to that in L 1/2 kneeling position    Rehab Potential Good   PT Frequency 2x / week   PT Duration 4 weeks   PT Treatment/Interventions ADLs/Self Care Home Management;Gait training;Stair training;Therapeutic activities;Therapeutic exercise;Balance training;Neuromuscular re-education;Patient/family  education   PT Next Visit Plan continue strengthening and balance exercises - plan D/C next week   PT Home Exercise Plan R hip extension, L hip abduction strengthening; posture retraining; HEP with pictures given on 12-09-16; balance exercises included   Recommended Other Services cont exercise program at gym (pt has resumed going to gym)   Consulted and Agree with Plan of Care Patient;Family member/caregiver   Family Member Consulted mother      Patient will benefit from skilled therapeutic intervention in order to improve the following deficits and impairments:  Abnormal gait, Decreased balance, Decreased strength, Postural dysfunction, Impaired UE functional use  Visit Diagnosis: Other abnormalities of gait and mobility  Muscle weakness (generalized)  Unsteadiness on feet     Problem List Patient Active Problem List   Diagnosis Date Noted  . Gait disorder 10/12/2016  . Memory loss 10/12/2016  . Epilepsy ([place]) 03/30/2013  . Malignant neoplasm of brain ([place]) 03/30/2013  . Astrocytoma brain tumor ([place]) 03/27/2012    Lakeena Downie, [place], PT 12/17/2016, 10:49 AM  [facility] [address] [place], [place], [address] Phone: [phone]   Fax:  [phone]  Name: Howard White MRN: [id number] Date of Birth: [birth year]

## 2016-12-20 ENCOUNTER — Ambulatory Visit: Payer: Medicare Other | Admitting: Physical Therapy

## 2016-12-21 ENCOUNTER — Ambulatory Visit: Payer: Medicare Other | Admitting: Physical Therapy

## 2016-12-21 DIAGNOSIS — R2681 Unsteadiness on feet: Secondary | ICD-10-CM | POA: Diagnosis not present

## 2016-12-21 DIAGNOSIS — M6281 Muscle weakness (generalized): Secondary | ICD-10-CM

## 2016-12-21 DIAGNOSIS — R2689 Other abnormalities of gait and mobility: Secondary | ICD-10-CM

## 2016-12-21 NOTE — Therapy (Signed)
[facility] [address] [place], [place], [address] Phone: [phone]   Fax:  [phone]  Physical Therapy Treatment  Patient Details  Name: Howard White MRN: [id number] Date of Birth: April 25, 1961 Referring Provider: Cecille Rubin, NP  Encounter Date: 12/21/2016      PT End of Session - 12/21/16 2059    Visit Number 6   Number of Visits 9   Date for PT Re-Evaluation 12/22/16   Authorization Type Medicare   Authorization Time Period 11-22-16 - 01-19-17   PT Start Time 0847   PT Stop Time 0932   PT Time Calculation (min) 45 min      Past Medical History:  Diagnosis Date  . Brain cancer ([place])   . Seizures ([place])   . Thyroid disease    Hypothyroid    Past Surgical History:  Procedure Laterality Date  . brain cancer    . BRAIN SURGERY      There were no vitals filed for this visit.      Subjective Assessment - 12/21/16 2054    Subjective Pt reports he continues to have muscle soreness in his hips 1-2 days after PT sessions, but feels he is getting stronger   Pertinent History Pt had brain surgery 17 years ago for astrocytoma; had seizure in June 2017 which resulted in R sided weakness; epilepsy   Patient Stated Goals Improve strength on R side    Currently in Pain? No/denies               TherEx:    Pt performed RLE SLR 10 reps with 5# Rt heel slide with extension 5# 10 reps        [facility] Adult PT Treatment/Exercise - 12/21/16 0910      Lumbar Exercises: Aerobic   Elliptical Level 1.6 1.5" forward and 1.5" backward     Lumbar Exercises: Machines for Strengthening   Leg Press bil. LE's 70# 15 reps:  RLE only 10 reps 70#:  LLE only 70# 10 reps      Lumbar Exercises: Standing   Heel Raises 10 reps     Lumbar Exercises: Supine   Bridge 10 reps;Non-compliant   Other Supine Lumbar Exercises Pt performed bridging with hip abdct/adduction , bridging with marching and bridging with LE  extension 10 reps each exercise     Lumbar Exercises: Sidelying   Clam 20 reps  5# 20 reps   Hip Abduction 20 reps  RLE 3#     Lumbar Exercises: Prone   Straight Leg Raise --  5     Neuro Re-ed:  Pt performed SLS activity with UE support prn - hip/knee flexion/extension with cues for control x 10 reps  Each for improved SLS  1/2 kneeling on RLE and on LLE - lifting alternating UE for improved core stabilization Pt performed plank - 3 reps with 5 sec hold               PT Long Term Goals - 12/17/16 1047      PT LONG TERM GOAL #1   Title Pt will perform LLE SLS for >/= 10 secs to demo improved balance. 12-24-15   Status New     PT LONG TERM GOAL #2   Title Increase gait velocity to >/= 3.9 ft/sec without device for incr. gait efficiency.  12-24-15   Status New     PT LONG TERM GOAL #3   Title Pt will amb. 250' with min. posture deviations, i.e.  min to no lateral Rt trunk lean. 12-24-15     PT LONG TERM GOAL #4   Title Pt will be able to jog 120' on flat, even surface to demo improved balance and lower extremity strength.  12-24-15               Plan - 12/21/16 2100    Clinical Impression Statement Pt continues to progress toward LTG's - plan discharge next session: pt continues to have postural deviations with trunk assymetry with Rt lateral trunk lean but able to correct with cues   Rehab Potential Good   PT Frequency 2x / week   PT Duration 4 weeks   PT Treatment/Interventions ADLs/Self Care Home Management;Gait training;Stair training;Therapeutic activities;Therapeutic exercise;Balance training;Neuromuscular re-education;Patient/family education   PT Next Visit Plan continue strengthening and balance exercises - plan D/C next week   PT Home Exercise Plan R hip extension, L hip abduction strengthening; posture retraining; HEP with pictures given on 12-09-16; balance exercises included   Consulted and Agree with Plan of Care Patient;Family member/caregiver    Family Member Consulted mother      Patient will benefit from skilled therapeutic intervention in order to improve the following deficits and impairments:  Abnormal gait, Decreased balance, Decreased strength, Postural dysfunction, Impaired UE functional use  Visit Diagnosis: Other abnormalities of gait and mobility  Muscle weakness (generalized)     Problem List Patient Active Problem List   Diagnosis Date Noted  . Gait disorder 10/12/2016  . Memory loss 10/12/2016  . Epilepsy ([place]) 03/30/2013  . Malignant neoplasm of brain ([place]) 03/30/2013  . Astrocytoma brain tumor ([place]) 03/27/2012    Howard White, Howard White, PT 12/21/2016, 9:04 PM  [facility] [address] [place], [place], [address] Phone: [phone]   Fax:  [phone]  Name: Howard White MRN: [id number] Date of Birth: Nov 17, 1960

## 2016-12-23 ENCOUNTER — Ambulatory Visit: Payer: Medicare Other | Admitting: Physical Therapy

## 2016-12-23 DIAGNOSIS — R2689 Other abnormalities of gait and mobility: Secondary | ICD-10-CM

## 2016-12-23 DIAGNOSIS — M6281 Muscle weakness (generalized): Secondary | ICD-10-CM

## 2016-12-23 DIAGNOSIS — R2681 Unsteadiness on feet: Secondary | ICD-10-CM | POA: Diagnosis not present

## 2016-12-23 NOTE — Patient Instructions (Addendum)
Bridging With Pelvic Floor (Hook-Lying)    Lie with hips and knees bent. Tighten pelvic floor while lifting bottom. Hold for _3__ seconds. Relax. Repeat _10__ times. Do _1__ times a day. Advanced: Hands across chest. Hands behind head.  Move knees apart - stay lifted in bridge position - move knees back together - hips go back down  Copyright  VHI. All rights reserved.  Bracing With March in Bridging (Hook-Lying)    With neutral spine, tighten pelvic floor and abdominals and hold. Lift bottom and hold, then march in place. March _10__ times. Do _1__ times a day.   Copyright  VHI. All rights reserved.  Bridging: with Straight Leg Raise    With legs bent, lift buttocks _10___ inches from floor. Then slowly extend right knee, keeping stomach tight. Repeat __10__ times per set. Do __1__ sets per session. Do __1_ sessions per day.  http://orth.exer.us/1104   Copyright  VHI. All rights reserved.

## 2016-12-24 ENCOUNTER — Ambulatory Visit: Payer: Medicare Other | Admitting: Nurse Practitioner

## 2016-12-24 NOTE — Therapy (Signed)
[facility] [address] [place], [place], [address] Phone: [phone]   Fax:  [phone]  Physical Therapy Treatment  Patient Details  Name: Howard White MRN: [id number] Date of Birth: June 01, 1961 Referring Provider: Cecille Rubin, NP  Encounter Date: 12/23/2016      PT End of Session - 12/24/16 1326    Visit Number 7   Number of Visits 9   Date for PT Re-Evaluation 12/22/16   Authorization Type Medicare   Authorization Time Period 11-22-16 - 01-19-17   PT Start Time 0801   PT Stop Time 0846   PT Time Calculation (min) 45 min      Past Medical History:  Diagnosis Date  . Brain cancer ([place])   . Seizures ([place])   . Thyroid disease    Hypothyroid    Past Surgical History:  Procedure Laterality Date  . brain cancer    . BRAIN SURGERY      There were no vitals filed for this visit.      Subjective Assessment - 12/24/16 1318    Subjective Pt reports he is doing well - ready to finish up with PT and will be going to gym in [facility]   Pertinent History Pt had brain surgery 17 years ago for astrocytoma; had seizure in June 2017 which resulted in R sided weakness; epilepsy   Patient Stated Goals Improve strength on R side    Currently in Pain? No/denies                         [facility] Adult PT Treatment/Exercise - 12/24/16 0001      Ambulation/Gait   Ambulation/Gait Yes   Ambulation/Gait Assistance 7: Independent   Ambulation/Gait Assistance Details slight Rt lateral trunk lean - pt able to correct posture with cues   Ambulation Distance (Feet) 260 Feet   Assistive device None   Gait Pattern Within Functional Limits   Ambulation Surface Level;Indoor   Gait velocity 4.07 ft/sec = 8.06 secs     Lumbar Exercises: Aerobic   Elliptical Level 1.6 1.5" forward and 1.5" backward     Lumbar Exercises: Machines for Strengthening   Leg Press bil. LE's 70#, 90#     Lumbar Exercises:  Standing   Heel Raises 10 reps     Lumbar Exercises: Supine   Bridge 10 reps;Non-compliant   Other Supine Lumbar Exercises Pt performed bridging with hip abdct/adduction , bridging with marching and bridging with LE extension 10 reps each exercise     Lumbar Exercises: Sidelying   Clam 20 reps  5# 20 reps   Hip Abduction 20 reps  RLE 3#     Knee/Hip Exercises: Supine   Straight Leg Raises Strengthening;Right;10 reps     Knee/Hip Exercises: Sidelying   Hip ABduction Strengthening;Right;10 reps  3#      Pt able to jog 120' around track in gym without LOB and with minimal to no gait deviations  Pt performed LLE SLS 11.19 secs           PT Education - 12/24/16 1328    Education provided Yes   Education Details added bridging with marching, with hip abduction/adduction and with LE extension to HEP for core strengthening   Person(s) Educated Patient   Methods Explanation;Demonstration;Handout   Comprehension Verbalized understanding;Returned demonstration             PT Long Term Goals - 12/24/16 1319      PT  LONG TERM GOAL #1   Title Pt will perform LLE SLS for >/= 10 secs to demo improved balance. 01/14/2017   Baseline 11.19 secs on LLE - 2017-01-14   Status Achieved     PT LONG TERM GOAL #2   Title Increase gait velocity to >/= 3.9 ft/sec without device for incr. gait efficiency.  01/14/2017   Baseline 8.06 secs= 4.07 ft/sec -- 2017/01/14   Status Achieved     PT LONG TERM GOAL #3   Title Pt will amb. 250' with min. posture deviations, i.e. min to no lateral Rt trunk lean. Jan 14, 2017   Baseline minimal lateral Rt trunk lean  - Jan 14, 2017   Status Achieved     PT LONG TERM GOAL #4   Title Pt will be able to jog 120' on flat, even surface to demo improved balance and lower extremity strength.  14-Jan-2017   Baseline goal met 2017-01-14   Status Achieved               Plan - 12/24/16 1327    Clinical Impression Statement Pt has met LTG's #1-4; pt has made good progress - able  to transition to gym for continued exercise program   Rehab Potential Good   PT Frequency 2x / week   PT Duration 4 weeks   PT Next Visit Plan N/A - D/C   PT Home Exercise Plan R hip extension, L hip abduction strengthening; posture retraining; HEP with pictures given on 12-09-16; balance exercises included   Consulted and Agree with Plan of Care Patient;Family member/caregiver      Patient will benefit from skilled therapeutic intervention in order to improve the following deficits and impairments:  Abnormal gait, Decreased balance, Decreased strength, Postural dysfunction, Impaired UE functional use  Visit Diagnosis: Other abnormalities of gait and mobility  Muscle weakness (generalized)       G-Codes - 01-14-17 1329    Functional Assessment Tool Used Gait velocity 4.07 ft/sec: improved posture with less lateral Rt trunk lean   Functional Limitation Mobility: Walking and moving around   Mobility: Walking and Moving Around Goal Status [phone]) At least 1 percent but less than 20 percent impaired, limited or restricted   Mobility: Walking and Moving Around Discharge Status [phone]) 0 percent impaired, limited or restricted      Problem List Patient Active Problem List   Diagnosis Date Noted  . Gait disorder 10/12/2016  . Memory loss 10/12/2016  . Epilepsy ([place]) 03/30/2013  . Malignant neoplasm of brain ([place]) 03/30/2013  . Astrocytoma brain tumor ([place]) 03/27/2012    PHYSICAL THERAPY DISCHARGE SUMMARY  Visits from Start of Care: 7  Current functional level related to goals / functional outcomes: See above for progress towards goals - pt has met 4/4 LTG's   Remaining deficits: Continued minimal postural deviations with pt leaning toward left side - pt is more aware of postural assymetry and is able to correct and minimize deviations  with conscious effort   Education / Equipment: Pt has been instructed in HEP for RLE strengthening and core strengthening.  Pt reports plans  to transition to gym for continued exercise program Plan: Patient agrees to discharge.  Patient goals were met. Patient is being discharged due to meeting the stated rehab goals.  ?????       RDEYCX, KGYJE HUDJSHF, PT 12/24/2016, 1:34 PM  [facility] [address] [place], [place], [address] Phone: [phone]   Fax:  [phone]  Name: Howard White  MRN: [id number] Date of Birth: September 04, 1961

## 2017-01-20 ENCOUNTER — Ambulatory Visit: Payer: Medicare Other | Admitting: Adult Health

## 2017-02-08 ENCOUNTER — Encounter (INDEPENDENT_AMBULATORY_CARE_PROVIDER_SITE_OTHER): Payer: Self-pay

## 2017-02-08 ENCOUNTER — Ambulatory Visit (INDEPENDENT_AMBULATORY_CARE_PROVIDER_SITE_OTHER): Payer: Medicare Other | Admitting: Nurse Practitioner

## 2017-02-08 ENCOUNTER — Encounter: Payer: Self-pay | Admitting: Nurse Practitioner

## 2017-02-08 VITALS — BP 134/83 | HR 89 | Ht 70.0 in | Wt 199.4 lb

## 2017-02-08 DIAGNOSIS — C719 Malignant neoplasm of brain, unspecified: Secondary | ICD-10-CM | POA: Diagnosis not present

## 2017-02-08 DIAGNOSIS — R269 Unspecified abnormalities of gait and mobility: Secondary | ICD-10-CM | POA: Diagnosis not present

## 2017-02-08 DIAGNOSIS — G40209 Localization-related (focal) (partial) symptomatic epilepsy and epileptic syndromes with complex partial seizures, not intractable, without status epilepticus: Secondary | ICD-10-CM

## 2017-02-08 DIAGNOSIS — R413 Other amnesia: Secondary | ICD-10-CM
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
## 2017-02-08 NOTE — Patient Instructions (Addendum)
Continue Lamictal at current dose Call for seizure activity Continue to work out No driving  Follow up 6 months next with Dr. Krista Blue

## 2017-02-09 NOTE — Progress Notes (Signed)
I have reviewed and agreed above plan. 

## 2017-04-12 ENCOUNTER — Ambulatory Visit: Payer: Medicare Other | Admitting: Neurology

## 2017-06-30 DIAGNOSIS — F3341 Major depressive disorder, recurrent, in partial remission: Secondary | ICD-10-CM | POA: Diagnosis not present

## 2017-06-30 DIAGNOSIS — J40 Bronchitis, not specified as acute or chronic: Secondary | ICD-10-CM | POA: Diagnosis not present

## 2017-06-30 DIAGNOSIS — F329 Major depressive disorder, single episode, unspecified: Secondary | ICD-10-CM | POA: Diagnosis not present

## 2017-06-30 DIAGNOSIS — E039 Hypothyroidism, unspecified: Secondary | ICD-10-CM | POA: Diagnosis not present

## 2017-08-03 ENCOUNTER — Telehealth: Payer: Self-pay | Admitting: Nurse Practitioner

## 2017-08-03 NOTE — Telephone Encounter (Signed)
Patient's mother is calling. She would like a call back before patient's appointment with Dr. Krista Blue on 08-16-17 to discuss patient's driving. They are concerned.

## 2017-08-03 NOTE — Telephone Encounter (Signed)
Spoke to his mother on HIPAA - states her son has expressed interest in driving and this is likely to come up again at this follow up.  His mother is concerned and does not think he is safe to drive.  States this has already been discussed with Teo in the his last appointments.  She feels his driving restrictions need to be reviewed with him again.

## 2017-08-16 ENCOUNTER — Ambulatory Visit (INDEPENDENT_AMBULATORY_CARE_PROVIDER_SITE_OTHER): Payer: Medicare Other | Admitting: Neurology

## 2017-08-16 ENCOUNTER — Encounter (INDEPENDENT_AMBULATORY_CARE_PROVIDER_SITE_OTHER): Payer: Self-pay

## 2017-08-16 ENCOUNTER — Encounter: Payer: Self-pay | Admitting: Neurology

## 2017-08-16 VITALS — BP 123/81 | HR 75 | Ht 70.0 in | Wt 205.0 lb

## 2017-08-16 DIAGNOSIS — G40209 Localization-related (focal) (partial) symptomatic epilepsy and epileptic syndromes with complex partial seizures, not intractable, without status epilepticus: Secondary | ICD-10-CM | POA: Diagnosis not present

## 2017-08-16 DIAGNOSIS — R413 Other amnesia: Secondary | ICD-10-CM | POA: Diagnosis not present

## 2017-08-16 DIAGNOSIS — C719 Malignant neoplasm of brain, unspecified: Secondary | ICD-10-CM

## 2017-08-16 MED ORDER — LAMOTRIGINE 150 MG PO TABS: ORAL_TABLET | ORAL | 4 refills | Status: DC
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
## 2017-10-13 DIAGNOSIS — Z791 Long term (current) use of non-steroidal anti-inflammatories (NSAID): Secondary | ICD-10-CM | POA: Diagnosis not present

## 2017-10-13 DIAGNOSIS — R1013 Epigastric pain: Secondary | ICD-10-CM | POA: Diagnosis not present

## 2017-10-13 DIAGNOSIS — R195 Other fecal abnormalities: Secondary | ICD-10-CM | POA: Diagnosis not present

## 2017-10-13 DIAGNOSIS — R1084 Generalized abdominal pain: Secondary | ICD-10-CM | POA: Diagnosis not present

## 2017-10-21 DIAGNOSIS — Z125 Encounter for screening for malignant neoplasm of prostate: Secondary | ICD-10-CM | POA: Diagnosis not present

## 2017-10-21 DIAGNOSIS — E039 Hypothyroidism, unspecified: Secondary | ICD-10-CM | POA: Diagnosis not present

## 2017-10-21 DIAGNOSIS — F3341 Major depressive disorder, recurrent, in partial remission: Secondary | ICD-10-CM | POA: Diagnosis not present

## 2017-10-21 DIAGNOSIS — C719 Malignant neoplasm of brain, unspecified: Secondary | ICD-10-CM | POA: Diagnosis not present

## 2017-10-21 DIAGNOSIS — R195 Other fecal abnormalities: Secondary | ICD-10-CM | POA: Diagnosis not present

## 2017-10-21 DIAGNOSIS — Z0000 Encounter for general adult medical examination without abnormal findings: Secondary | ICD-10-CM | POA: Diagnosis not present

## 2017-10-21 DIAGNOSIS — Z1389 Encounter for screening for other disorder: Secondary | ICD-10-CM | POA: Diagnosis not present

## 2017-10-21 DIAGNOSIS — Z1211 Encounter for screening for malignant neoplasm of colon: Secondary | ICD-10-CM | POA: Diagnosis not present

## 2017-10-21 DIAGNOSIS — R569 Unspecified convulsions: Secondary | ICD-10-CM | POA: Diagnosis not present

## 2017-11-02 DIAGNOSIS — K269 Duodenal ulcer, unspecified as acute or chronic, without hemorrhage or perforation: Secondary | ICD-10-CM | POA: Diagnosis not present

## 2017-11-02 DIAGNOSIS — K2930 Chronic superficial gastritis without bleeding: Secondary | ICD-10-CM | POA: Diagnosis not present

## 2017-11-02 DIAGNOSIS — K921 Melena: Secondary | ICD-10-CM | POA: Diagnosis not present

## 2017-11-02 DIAGNOSIS — R195 Other fecal abnormalities: Secondary | ICD-10-CM | POA: Diagnosis not present

## 2017-11-02 DIAGNOSIS — K224 Dyskinesia of esophagus: Secondary | ICD-10-CM | POA: Diagnosis not present

## 2017-11-02 DIAGNOSIS — K222 Esophageal obstruction: Secondary | ICD-10-CM | POA: Diagnosis not present

## 2017-11-11 DIAGNOSIS — K2930 Chronic superficial gastritis without bleeding: Secondary | ICD-10-CM | POA: Diagnosis not present

## 2017-11-21 DIAGNOSIS — E039 Hypothyroidism, unspecified: Secondary | ICD-10-CM | POA: Diagnosis not present

## 2017-12-22 DIAGNOSIS — L570 Actinic keratosis: Secondary | ICD-10-CM | POA: Diagnosis not present

## 2018-01-03 DIAGNOSIS — K589 Irritable bowel syndrome without diarrhea: Secondary | ICD-10-CM | POA: Diagnosis not present

## 2018-01-03 DIAGNOSIS — K269 Duodenal ulcer, unspecified as acute or chronic, without hemorrhage or perforation: Secondary | ICD-10-CM | POA: Diagnosis not present

## 2018-01-03 DIAGNOSIS — K222 Esophageal obstruction: Secondary | ICD-10-CM | POA: Diagnosis not present

## 2018-01-03 DIAGNOSIS — K5730 Diverticulosis of large intestine without perforation or abscess without bleeding: Secondary | ICD-10-CM | POA: Diagnosis not present

## 2018-01-03 DIAGNOSIS — K2970 Gastritis, unspecified, without bleeding: Secondary | ICD-10-CM | POA: Diagnosis not present

## 2018-01-03 DIAGNOSIS — Z1211 Encounter for screening for malignant neoplasm of colon: Secondary | ICD-10-CM | POA: Diagnosis not present

## 2018-01-03 DIAGNOSIS — D126 Benign neoplasm of colon, unspecified: Secondary | ICD-10-CM | POA: Diagnosis not present

## 2018-01-06 DIAGNOSIS — Z1211 Encounter for screening for malignant neoplasm of colon: Secondary | ICD-10-CM | POA: Diagnosis not present

## 2018-01-06 DIAGNOSIS — D126 Benign neoplasm of colon, unspecified: Secondary | ICD-10-CM | POA: Diagnosis not present

## 2018-01-09 DIAGNOSIS — K269 Duodenal ulcer, unspecified as acute or chronic, without hemorrhage or perforation: Secondary | ICD-10-CM | POA: Diagnosis not present

## 2018-01-09 DIAGNOSIS — R1013 Epigastric pain: Secondary | ICD-10-CM | POA: Diagnosis not present

## 2018-01-09 DIAGNOSIS — R195 Other fecal abnormalities: Secondary | ICD-10-CM | POA: Diagnosis not present

## 2018-01-09 DIAGNOSIS — Z791 Long term (current) use of non-steroidal anti-inflammatories (NSAID): Secondary | ICD-10-CM | POA: Diagnosis not present

## 2018-01-12 DIAGNOSIS — E039 Hypothyroidism, unspecified: Secondary | ICD-10-CM | POA: Diagnosis not present

## 2018-02-17 DIAGNOSIS — G40909 Epilepsy, unspecified, not intractable, without status epilepticus: Secondary | ICD-10-CM | POA: Diagnosis not present

## 2018-02-17 DIAGNOSIS — C719 Malignant neoplasm of brain, unspecified: Secondary | ICD-10-CM | POA: Diagnosis not present

## 2018-02-17 DIAGNOSIS — E039 Hypothyroidism, unspecified: Secondary | ICD-10-CM | POA: Diagnosis not present

## 2018-02-17 DIAGNOSIS — F331 Major depressive disorder, recurrent, moderate: Secondary | ICD-10-CM | POA: Diagnosis not present

## 2018-02-21 ENCOUNTER — Telehealth: Payer: Self-pay | Admitting: Neurology

## 2018-02-21 NOTE — Telephone Encounter (Signed)
Per vo by Dr. Krista Blue, he may use any SSRI.  However, due to his medical history (left frontal anaplastic astrocytoma grade 3, underwent resection at [facility] followed by radiation and chemotherapy), he may be more susceptible to side effects.  He is currently on Lamictal 150mg  in am and 300mg  in pm which also doubles to improve mood.  I have returned the call to [facility] and left a detailed message on her voicemail.  I provided our number to call back with any further questions.

## 2018-02-21 NOTE — Telephone Encounter (Signed)
Darlene with Dr. Lina Sar office is calling. Dr. Delfina Redwood wants to know what anti depressant the patient can take. Carlyon Shadow can be reached at [phone].

## 2018-03-24 DIAGNOSIS — D3709 Neoplasm of uncertain behavior of other specified sites of the oral cavity: Secondary | ICD-10-CM | POA: Diagnosis not present

## 2018-03-28 DIAGNOSIS — K1379 Other lesions of oral mucosa: Secondary | ICD-10-CM | POA: Diagnosis not present

## 2018-03-29 DIAGNOSIS — E039 Hypothyroidism, unspecified: Secondary | ICD-10-CM | POA: Diagnosis not present

## 2018-03-29 DIAGNOSIS — F322 Major depressive disorder, single episode, severe without psychotic features: Secondary | ICD-10-CM | POA: Diagnosis not present

## 2018-04-12 ENCOUNTER — Other Ambulatory Visit: Payer: Self-pay | Admitting: *Deleted

## 2018-04-12 ENCOUNTER — Telehealth: Payer: Self-pay | Admitting: Neurology

## 2018-04-12 DIAGNOSIS — C719 Malignant neoplasm of brain, unspecified: Secondary | ICD-10-CM

## 2018-04-12 NOTE — Telephone Encounter (Signed)
Medicare order sent to GI. No auth they will reach out to the pt to schedule.  °

## 2018-04-21 ENCOUNTER — Ambulatory Visit
Admission: RE | Admit: 2018-04-21 | Discharge: 2018-04-21 | Disposition: A | Discharge status: Home / Self Care | Payer: Medicare Other | Source: Ambulatory Visit | Attending: Neurology | Admitting: Neurology

## 2018-04-21 DIAGNOSIS — C719 Malignant neoplasm of brain, unspecified: Secondary | ICD-10-CM

## 2018-04-21 MED ORDER — GADOBENATE DIMEGLUMINE 529 MG/ML IV SOLN
20.0000 mL | Freq: Once | INTRAVENOUS | Status: AC | PRN
  Administered 2018-04-21: 20 mL via INTRAVENOUS

## 2018-04-24 ENCOUNTER — Telehealth: Payer: Self-pay | Admitting: Neurology

## 2018-04-24 NOTE — Telephone Encounter (Signed)
Spoke to patient - he is aware of results and will keep his pending appt for further review.

## 2018-04-24 NOTE — Telephone Encounter (Signed)
Please call patient, repeat MRI of brain showed stable large cystic resection cavity in the left frontal lobe, no significant change compare to previous scan in June 2017  IMPRESSION: Abnormal MRI scan of the brain showing stable changes of large cystic resection cavity in the left frontal lobe following prior glioma resection and symmetric white matter T2/flair hyperintensities likely due to postradiation changes.  No acute abnormalities noted.  No enhancing lesions are recurrence of tumor seen.  No significant change compared with previous MRI dated 05/03/2016

## 2018-04-25 DIAGNOSIS — G40909 Epilepsy, unspecified, not intractable, without status epilepticus: Secondary | ICD-10-CM | POA: Diagnosis not present

## 2018-04-25 DIAGNOSIS — F331 Major depressive disorder, recurrent, moderate: Secondary | ICD-10-CM | POA: Diagnosis not present

## 2018-04-25 DIAGNOSIS — E039 Hypothyroidism, unspecified: Secondary | ICD-10-CM | POA: Diagnosis not present

## 2018-05-17 ENCOUNTER — Encounter: Payer: Self-pay | Admitting: Neurology

## 2018-05-17 ENCOUNTER — Ambulatory Visit (INDEPENDENT_AMBULATORY_CARE_PROVIDER_SITE_OTHER): Payer: Medicare Other | Admitting: Neurology

## 2018-05-17 VITALS — BP 128/86 | HR 63 | Wt 194.5 lb

## 2018-05-17 DIAGNOSIS — G40209 Localization-related (focal) (partial) symptomatic epilepsy and epileptic syndromes with complex partial seizures, not intractable, without status epilepticus: Secondary | ICD-10-CM | POA: Diagnosis not present

## 2018-05-17 DIAGNOSIS — C711 Malignant neoplasm of frontal lobe: Secondary | ICD-10-CM | POA: Diagnosis not present

## 2018-05-17 MED ORDER — LAMOTRIGINE 150 MG PO TABS: ORAL_TABLET | ORAL | 4 refills | Status: DC
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
## 2018-06-05 ENCOUNTER — Telehealth: Payer: Self-pay | Admitting: Neurology

## 2018-06-05 NOTE — Telephone Encounter (Signed)
Pt requesting a call from Dr. Krista Blue did not wish to speak with RN. Stating that he has been on lamoTRIgine (LAMICTAL) 150 MG tablet for about 3 years now and would like to discuss a few s/e such as drowsiness - did not wish to discuss further.  Please call to advise

## 2018-06-06 NOTE — Telephone Encounter (Addendum)
Spoke to [employer] mother (on [place]).  States Giovanie has been feeling tired during the day but she had a long talk with him concerning the following:  1) discontinue use of melatonin or other OTC sleep aids 2) start exercising again 3) allow her to help keep track of his medications (times, doses, etc) 4) accept his limitations with a positive outlook 5) stay on a good sleep schedule with only a short nap during the day.  They are all in agreement for him to continue lamotrigine.  His co-pay for the extended release form was too expensive.  He needs to stay on the immediate release.  She appreciated our time today.

## 2018-06-06 NOTE — Telephone Encounter (Signed)
I was able to talk with Elta [place], he talks about not be able to sleep well, attribute it to lamotrigine, he was switched from keppra to lamotrigine due to his complains of depression, gait abnormality.  I do not think, switch back from lamotrigine to Keppra would make any difference in his current complaints of fatigue, not sleeping well, actually might make his depression worse  Sharyn Lull, will you please call his parents to relay above information

## 2018-07-26 DIAGNOSIS — L570 Actinic keratosis: Secondary | ICD-10-CM | POA: Diagnosis not present

## 2018-09-27 ENCOUNTER — Telehealth: Payer: Self-pay | Admitting: Neurology

## 2018-09-27 MED ORDER — LAMOTRIGINE 150 MG PO TABS: ORAL_TABLET | ORAL | 3 refills | Status: DC

## 2018-09-27 NOTE — Telephone Encounter (Signed)
Pts mother [name] on [place] called stating that the pt has been experiencing "dizzy/ off balance feelings" and seizure like activity. Stating pt has been taking medication lamoTRIgine (LAMICTAL) 150 MG tablet regularly, would like a call to discuss in greater detail with RN

## 2018-09-27 NOTE — Telephone Encounter (Signed)
Spoke to patient's mother, Howard White (on [place]).  Reports patient having a staring spell this morning while at kitchen table.  He was off balance when he stood up and would have fallen if his father had not been beside him.  He is feeling fatigued now and is laying down to rest.  She also reports some confusion at dinner two days ago.  Says he was trying to say the word [employer] but could not get it out.  He is currently taking lamotrigine 150mg , one tablet in am and two tablets in pm.  He has not missed any medications.  Per vo by Dr. Krista Blue, increase lamotirigine 150mg  to 1.5 tablets in am and keep 2 tablets in pm.  His mother, Howard White, verbalized understanding of this medication adjustment.  She repeated the change back to me correctly.  New rx sent to the pharmacy.

## 2018-10-09 DIAGNOSIS — R569 Unspecified convulsions: Secondary | ICD-10-CM | POA: Diagnosis not present

## 2018-10-09 DIAGNOSIS — F322 Major depressive disorder, single episode, severe without psychotic features: Secondary | ICD-10-CM | POA: Diagnosis not present

## 2018-11-23 DIAGNOSIS — J069 Acute upper respiratory infection, unspecified: Secondary | ICD-10-CM | POA: Diagnosis not present

## 2018-11-27 DIAGNOSIS — Z0000 Encounter for general adult medical examination without abnormal findings: Secondary | ICD-10-CM | POA: Diagnosis not present

## 2018-11-27 DIAGNOSIS — E039 Hypothyroidism, unspecified: Secondary | ICD-10-CM | POA: Diagnosis not present

## 2018-11-27 DIAGNOSIS — Z1389 Encounter for screening for other disorder: Secondary | ICD-10-CM | POA: Diagnosis not present

## 2018-11-27 DIAGNOSIS — F3341 Major depressive disorder, recurrent, in partial remission: Secondary | ICD-10-CM | POA: Diagnosis not present

## 2018-11-27 DIAGNOSIS — C719 Malignant neoplasm of brain, unspecified: Secondary | ICD-10-CM | POA: Diagnosis not present

## 2018-11-27 DIAGNOSIS — Z125 Encounter for screening for malignant neoplasm of prostate: Secondary | ICD-10-CM | POA: Diagnosis not present

## 2018-11-27 DIAGNOSIS — G40909 Epilepsy, unspecified, not intractable, without status epilepticus: Secondary | ICD-10-CM | POA: Diagnosis not present

## 2019-01-11 DIAGNOSIS — D225 Melanocytic nevi of trunk: Secondary | ICD-10-CM | POA: Diagnosis not present

## 2019-01-11 DIAGNOSIS — D485 Neoplasm of uncertain behavior of skin: Secondary | ICD-10-CM | POA: Diagnosis not present

## 2019-01-11 DIAGNOSIS — L570 Actinic keratosis: Secondary | ICD-10-CM | POA: Diagnosis not present

## 2019-01-11 DIAGNOSIS — D0362 Melanoma in situ of left upper limb, including shoulder: Secondary | ICD-10-CM | POA: Diagnosis not present

## 2019-01-11 DIAGNOSIS — Z86018 Personal history of other benign neoplasm: Secondary | ICD-10-CM | POA: Diagnosis not present

## 2019-01-11 DIAGNOSIS — Z23 Encounter for immunization: Secondary | ICD-10-CM | POA: Diagnosis not present

## 2019-02-02 DIAGNOSIS — D0362 Melanoma in situ of left upper limb, including shoulder: Secondary | ICD-10-CM | POA: Diagnosis not present

## 2019-03-12 ENCOUNTER — Telehealth: Payer: Self-pay | Admitting: Neurology

## 2019-03-12 NOTE — Telephone Encounter (Signed)
I called [facility] and spoke to [place].  She was able to find his refills on file and will get his prescription ready for him.  I called the patient's mother back [name] on [place]) to update her with this information.

## 2019-03-12 NOTE — Telephone Encounter (Signed)
pts mom called in and stated the pharmacy isnt letting them receive a refill for   lamoTRIgine (LAMICTAL) 150 MG tablet   Due to script needing to be updated from 09/27/2018

## 2019-04-18 DIAGNOSIS — D225 Melanocytic nevi of trunk: Secondary | ICD-10-CM | POA: Diagnosis not present

## 2019-04-18 DIAGNOSIS — L570 Actinic keratosis: Secondary | ICD-10-CM | POA: Diagnosis not present

## 2019-04-18 DIAGNOSIS — Z86018 Personal history of other benign neoplasm: Secondary | ICD-10-CM | POA: Diagnosis not present

## 2019-04-18 DIAGNOSIS — D0362 Melanoma in situ of left upper limb, including shoulder: Secondary | ICD-10-CM | POA: Diagnosis not present

## 2019-04-18 DIAGNOSIS — D485 Neoplasm of uncertain behavior of skin: Secondary | ICD-10-CM | POA: Diagnosis not present

## 2019-04-23 DIAGNOSIS — R5383 Other fatigue: Secondary | ICD-10-CM | POA: Diagnosis not present

## 2019-04-23 DIAGNOSIS — E039 Hypothyroidism, unspecified: Secondary | ICD-10-CM | POA: Diagnosis not present

## 2019-05-15 DIAGNOSIS — H019 Unspecified inflammation of eyelid: Secondary | ICD-10-CM | POA: Diagnosis not present

## 2019-05-15 DIAGNOSIS — H109 Unspecified conjunctivitis: Secondary | ICD-10-CM | POA: Diagnosis not present

## 2019-05-15 DIAGNOSIS — H00013 Hordeolum externum right eye, unspecified eyelid: Secondary | ICD-10-CM | POA: Diagnosis not present
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
## 2019-05-23 ENCOUNTER — Ambulatory Visit: Payer: Medicare Other | Admitting: Nurse Practitioner

## 2019-05-23 ENCOUNTER — Other Ambulatory Visit: Payer: Self-pay

## 2019-05-23 ENCOUNTER — Encounter: Payer: Self-pay | Admitting: Neurology

## 2019-05-23 ENCOUNTER — Ambulatory Visit (INDEPENDENT_AMBULATORY_CARE_PROVIDER_SITE_OTHER): Payer: PPO | Admitting: Neurology

## 2019-05-23 VITALS — BP 118/82 | HR 68 | Temp 98.0°F | Ht 70.0 in | Wt 191.2 lb

## 2019-05-23 DIAGNOSIS — G40209 Localization-related (focal) (partial) symptomatic epilepsy and epileptic syndromes with complex partial seizures, not intractable, without status epilepticus: Secondary | ICD-10-CM

## 2019-05-23 MED ORDER — LAMOTRIGINE 150 MG PO TABS: ORAL_TABLET | ORAL | 3 refills | Status: DC

## 2019-05-23 NOTE — Patient Instructions (Signed)
It was a pleasure to meet you today! You look great! I will see you in 1 year!

## 2019-05-28 ENCOUNTER — Telehealth: Payer: Self-pay | Admitting: *Deleted

## 2019-05-28 DIAGNOSIS — E039 Hypothyroidism, unspecified: Secondary | ICD-10-CM | POA: Diagnosis not present

## 2019-05-28 LAB — CBC WITH DIFFERENTIAL/PLATELET
Basophils Absolute: 0 10*3/uL (ref 0.0–0.2)
Basos: 0 %
EOS (ABSOLUTE): 0.2 10*3/uL (ref 0.0–0.4)
Eos: 3 %
Hematocrit: 43.9 % (ref 37.5–51.0)
Hemoglobin: 15.3 g/dL (ref 13.0–17.7)
Immature Grans (Abs): 0 10*3/uL (ref 0.0–0.1)
Immature Granulocytes: 0 %
Lymphocytes Absolute: 1.8 10*3/uL (ref 0.7–3.1)
Lymphs: 29 %
MCH: 31 pg (ref 26.6–33.0)
MCHC: 34.9 g/dL (ref 31.5–35.7)
MCV: 89 fL (ref 79–97)
Monocytes Absolute: 0.7 10*3/uL (ref 0.1–0.9)
Monocytes: 11 %
Neutrophils Absolute: 3.5 10*3/uL (ref 1.4–7.0)
Neutrophils: 57 %
Platelets: 293 10*3/uL (ref 150–450)
RBC: 4.93 x10E6/uL (ref 4.14–5.80)
RDW: 11.7 % (ref 11.6–15.4)
WBC: 6.2 10*3/uL (ref 3.4–10.8)

## 2019-05-28 LAB — COMPREHENSIVE METABOLIC PANEL
ALT: 16 IU/L (ref 0–44)
AST: 19 IU/L (ref 0–40)
Albumin/Globulin Ratio: 2.2 (ref 1.2–2.2)
Albumin: 4.7 g/dL (ref 3.8–4.9)
Alkaline Phosphatase: 62 IU/L (ref 39–117)
BUN/Creatinine Ratio: 13 (ref 9–20)
BUN: 15 mg/dL (ref 6–24)
Bilirubin Total: 0.6 mg/dL (ref 0.0–1.2)
CO2: 22 mmol/L (ref 20–29)
Calcium: 9.5 mg/dL (ref 8.7–10.2)
Chloride: 102 mmol/L (ref 96–106)
Creatinine, Ser: 1.16 mg/dL (ref 0.76–1.27)
GFR calc Af Amer: 80 mL/min/{1.73_m2} (ref >59)
GFR calc non Af Amer: 70 mL/min/{1.73_m2} (ref >59)
Globulin, Total: 2.1 g/dL (ref 1.5–4.5)
Glucose: 80 mg/dL (ref 65–99)
Potassium: 5 mmol/L (ref 3.5–5.2)
Sodium: 140 mmol/L (ref 134–144)
Total Protein: 6.8 g/dL (ref 6.0–8.5)

## 2019-05-28 LAB — LAMOTRIGINE LEVEL: Lamotrigine Lvl: 10 ug/mL (ref 2.0–20.0)

## 2019-05-28 NOTE — Progress Notes (Signed)
I have reviewed and agreed above plan. 

## 2019-05-28 NOTE — Telephone Encounter (Signed)
Spoke to pt and relayed that labs looked good, good lamictal level.  He verbalized understanding.

## 2019-05-28 NOTE — Telephone Encounter (Signed)
-----   Message from Suzzanne Cloud, NP sent at 05/28/2019 12:26 PM EDT ----- Please call the patient. Lab work looks good. Good level of Lamictal.

## 2019-05-29 DIAGNOSIS — E039 Hypothyroidism, unspecified: Secondary | ICD-10-CM | POA: Diagnosis not present

## 2019-10-24 DIAGNOSIS — L570 Actinic keratosis: Secondary | ICD-10-CM | POA: Diagnosis not present

## 2019-10-24 DIAGNOSIS — D485 Neoplasm of uncertain behavior of skin: Secondary | ICD-10-CM | POA: Diagnosis not present

## 2019-10-24 DIAGNOSIS — D225 Melanocytic nevi of trunk: Secondary | ICD-10-CM | POA: Diagnosis not present

## 2019-10-24 DIAGNOSIS — Z23 Encounter for immunization: Secondary | ICD-10-CM | POA: Diagnosis not present

## 2019-10-24 DIAGNOSIS — L821 Other seborrheic keratosis: Secondary | ICD-10-CM | POA: Diagnosis not present

## 2019-10-24 DIAGNOSIS — Z86018 Personal history of other benign neoplasm: Secondary | ICD-10-CM | POA: Diagnosis not present

## 2019-10-24 DIAGNOSIS — D0362 Melanoma in situ of left upper limb, including shoulder: Secondary | ICD-10-CM | POA: Diagnosis not present

## 2019-11-14 DIAGNOSIS — F329 Major depressive disorder, single episode, unspecified: Secondary | ICD-10-CM | POA: Diagnosis not present

## 2019-11-14 DIAGNOSIS — F322 Major depressive disorder, single episode, severe without psychotic features: Secondary | ICD-10-CM | POA: Diagnosis not present

## 2019-11-14 DIAGNOSIS — E039 Hypothyroidism, unspecified: Secondary | ICD-10-CM | POA: Diagnosis not present

## 2019-11-14 DIAGNOSIS — F331 Major depressive disorder, recurrent, moderate: Secondary | ICD-10-CM | POA: Diagnosis not present

## 2019-11-14 DIAGNOSIS — J40 Bronchitis, not specified as acute or chronic: Secondary | ICD-10-CM | POA: Diagnosis not present

## 2019-11-14 DIAGNOSIS — F3341 Major depressive disorder, recurrent, in partial remission: Secondary | ICD-10-CM | POA: Diagnosis not present

## 2019-11-23 DIAGNOSIS — J40 Bronchitis, not specified as acute or chronic: Secondary | ICD-10-CM | POA: Diagnosis not present

## 2019-11-23 DIAGNOSIS — F3341 Major depressive disorder, recurrent, in partial remission: Secondary | ICD-10-CM | POA: Diagnosis not present

## 2019-11-23 DIAGNOSIS — E039 Hypothyroidism, unspecified: Secondary | ICD-10-CM | POA: Diagnosis not present

## 2019-11-23 DIAGNOSIS — F331 Major depressive disorder, recurrent, moderate: Secondary | ICD-10-CM | POA: Diagnosis not present

## 2019-11-23 DIAGNOSIS — F322 Major depressive disorder, single episode, severe without psychotic features: Secondary | ICD-10-CM | POA: Diagnosis not present

## 2019-11-23 DIAGNOSIS — F329 Major depressive disorder, single episode, unspecified: Secondary | ICD-10-CM | POA: Diagnosis not present

## 2019-12-03 DIAGNOSIS — D225 Melanocytic nevi of trunk: Secondary | ICD-10-CM | POA: Diagnosis not present

## 2019-12-03 DIAGNOSIS — D485 Neoplasm of uncertain behavior of skin: Secondary | ICD-10-CM | POA: Diagnosis not present

## 2019-12-11 DIAGNOSIS — D485 Neoplasm of uncertain behavior of skin: Secondary | ICD-10-CM | POA: Diagnosis not present

## 2019-12-11 DIAGNOSIS — L988 Other specified disorders of the skin and subcutaneous tissue: Secondary | ICD-10-CM | POA: Diagnosis not present

## 2019-12-21 DIAGNOSIS — G40909 Epilepsy, unspecified, not intractable, without status epilepticus: Secondary | ICD-10-CM | POA: Diagnosis not present

## 2019-12-21 DIAGNOSIS — C719 Malignant neoplasm of brain, unspecified: Secondary | ICD-10-CM | POA: Diagnosis not present

## 2019-12-21 DIAGNOSIS — G4700 Insomnia, unspecified: Secondary | ICD-10-CM | POA: Diagnosis not present

## 2019-12-21 DIAGNOSIS — Z0000 Encounter for general adult medical examination without abnormal findings: Secondary | ICD-10-CM | POA: Diagnosis not present

## 2019-12-21 DIAGNOSIS — E039 Hypothyroidism, unspecified: Secondary | ICD-10-CM | POA: Diagnosis not present

## 2019-12-21 DIAGNOSIS — Z1389 Encounter for screening for other disorder: Secondary | ICD-10-CM | POA: Diagnosis not present

## 2019-12-21 DIAGNOSIS — F331 Major depressive disorder, recurrent, moderate: Secondary | ICD-10-CM | POA: Diagnosis not present

## 2020-02-25 ENCOUNTER — Ambulatory Visit: Payer: Medicare Other

## 2020-03-07 ENCOUNTER — Telehealth: Payer: Self-pay | Admitting: Neurology

## 2020-03-07 NOTE — Telephone Encounter (Signed)
Syring,Pat(mother) is asking for a call to discuss a letter request for pt to be excused for [employer], please call

## 2020-03-10 ENCOUNTER — Encounter: Payer: Self-pay | Admitting: *Deleted

## 2020-03-10 NOTE — Telephone Encounter (Signed)
I called mother of pt.  I relayed that will do jury letter for pt (per Rico Junker, NP) will fax summons  to pod 3 fax #.

## 2020-03-14 DIAGNOSIS — E039 Hypothyroidism, unspecified: Secondary | ICD-10-CM | POA: Diagnosis not present

## 2020-03-14 DIAGNOSIS — F322 Major depressive disorder, single episode, severe without psychotic features: Secondary | ICD-10-CM | POA: Diagnosis not present

## 2020-03-14 DIAGNOSIS — F331 Major depressive disorder, recurrent, moderate: Secondary | ICD-10-CM | POA: Diagnosis not present

## 2020-03-14 DIAGNOSIS — J40 Bronchitis, not specified as acute or chronic: Secondary | ICD-10-CM | POA: Diagnosis not present

## 2020-03-14 DIAGNOSIS — F3341 Major depressive disorder, recurrent, in partial remission: Secondary | ICD-10-CM | POA: Diagnosis not present

## 2020-03-14 DIAGNOSIS — G4700 Insomnia, unspecified: Secondary | ICD-10-CM | POA: Diagnosis not present

## 2020-03-14 DIAGNOSIS — F329 Major depressive disorder, single episode, unspecified: Secondary | ICD-10-CM | POA: Diagnosis not present

## 2020-03-21 DIAGNOSIS — F331 Major depressive disorder, recurrent, moderate: Secondary | ICD-10-CM | POA: Diagnosis not present

## 2020-03-21 DIAGNOSIS — F329 Major depressive disorder, single episode, unspecified: Secondary | ICD-10-CM | POA: Diagnosis not present

## 2020-03-21 DIAGNOSIS — J40 Bronchitis, not specified as acute or chronic: Secondary | ICD-10-CM | POA: Diagnosis not present

## 2020-03-21 DIAGNOSIS — E039 Hypothyroidism, unspecified: Secondary | ICD-10-CM | POA: Diagnosis not present

## 2020-03-21 DIAGNOSIS — F3341 Major depressive disorder, recurrent, in partial remission: Secondary | ICD-10-CM | POA: Diagnosis not present

## 2020-03-21 DIAGNOSIS — G4700 Insomnia, unspecified: Secondary | ICD-10-CM | POA: Diagnosis not present

## 2020-03-21 DIAGNOSIS — F322 Major depressive disorder, single episode, severe without psychotic features: Secondary | ICD-10-CM | POA: Diagnosis not present

## 2020-05-06 ENCOUNTER — Other Ambulatory Visit: Payer: Self-pay | Admitting: Neurology

## 2020-05-26 ENCOUNTER — Telehealth: Payer: Self-pay | Admitting: Neurology

## 2020-05-26 ENCOUNTER — Encounter: Payer: Self-pay | Admitting: Neurology

## 2020-05-26 ENCOUNTER — Other Ambulatory Visit: Payer: Self-pay

## 2020-05-26 ENCOUNTER — Ambulatory Visit: Payer: PPO | Admitting: Neurology

## 2020-05-26 VITALS — BP 120/83 | HR 63 | Ht 70.0 in | Wt 168.0 lb

## 2020-05-26 DIAGNOSIS — C719 Malignant neoplasm of brain, unspecified: Secondary | ICD-10-CM | POA: Diagnosis not present

## 2020-05-26 DIAGNOSIS — G40209 Localization-related (focal) (partial) symptomatic epilepsy and epileptic syndromes with complex partial seizures, not intractable, without status epilepticus: Secondary | ICD-10-CM

## 2020-05-26 NOTE — Patient Instructions (Signed)
Check blood work today  Continue Lamictal at current dosing I will let you know about MRI going forward See you back in 1 year or sooner if needed
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
## 2020-05-26 NOTE — Telephone Encounter (Signed)
health team order sent to GI. No auth they will reach out to the patient to schedule.  

## 2020-05-27 LAB — CBC WITH DIFFERENTIAL/PLATELET
Basophils Absolute: 0 10*3/uL (ref 0.0–0.2)
Basos: 0 %
EOS (ABSOLUTE): 0.3 10*3/uL (ref 0.0–0.4)
Eos: 4 %
Hematocrit: 46.9 % (ref 37.5–51.0)
Hemoglobin: 15.6 g/dL (ref 13.0–17.7)
Immature Grans (Abs): 0 10*3/uL (ref 0.0–0.1)
Immature Granulocytes: 0 %
Lymphocytes Absolute: 2.1 10*3/uL (ref 0.7–3.1)
Lymphs: 28 %
MCH: 31 pg (ref 26.6–33.0)
MCHC: 33.3 g/dL (ref 31.5–35.7)
MCV: 93 fL (ref 79–97)
Monocytes Absolute: 0.7 10*3/uL (ref 0.1–0.9)
Monocytes: 10 %
Neutrophils Absolute: 4.2 10*3/uL (ref 1.4–7.0)
Neutrophils: 58 %
Platelets: 271 10*3/uL (ref 150–450)
RBC: 5.03 x10E6/uL (ref 4.14–5.80)
RDW: 12.2 % (ref 11.6–15.4)
WBC: 7.4 10*3/uL (ref 3.4–10.8)

## 2020-05-27 LAB — COMPREHENSIVE METABOLIC PANEL
ALT: 18 IU/L (ref 0–44)
AST: 17 IU/L (ref 0–40)
Albumin/Globulin Ratio: 2.1 (ref 1.2–2.2)
Albumin: 4.6 g/dL (ref 3.8–4.9)
Alkaline Phosphatase: 65 IU/L (ref 48–121)
BUN/Creatinine Ratio: 15 (ref 9–20)
BUN: 15 mg/dL (ref 6–24)
Bilirubin Total: 0.5 mg/dL (ref 0.0–1.2)
CO2: 26 mmol/L (ref 20–29)
Calcium: 9.8 mg/dL (ref 8.7–10.2)
Chloride: 99 mmol/L (ref 96–106)
Creatinine, Ser: 1.02 mg/dL (ref 0.76–1.27)
GFR calc Af Amer: 93 mL/min/{1.73_m2} (ref >59)
GFR calc non Af Amer: 81 mL/min/{1.73_m2} (ref >59)
Globulin, Total: 2.2 g/dL (ref 1.5–4.5)
Glucose: 88 mg/dL (ref 65–99)
Potassium: 5 mmol/L (ref 3.5–5.2)
Sodium: 137 mmol/L (ref 134–144)
Total Protein: 6.8 g/dL (ref 6.0–8.5)

## 2020-05-27 LAB — LAMOTRIGINE LEVEL: Lamotrigine Lvl: 8.8 ug/mL (ref 2.0–20.0)

## 2020-05-28 ENCOUNTER — Telehealth: Payer: Self-pay

## 2020-05-28 NOTE — Telephone Encounter (Signed)
Mother of Howard White is a 59 y.o. male was notified of the message below from Judson Roch.

## 2020-05-28 NOTE — Telephone Encounter (Signed)
-----   Message from Suzzanne Cloud, NP sent at 05/27/2020  4:42 PM EDT ----- Labs are normal. Lamictal level is within therapeutic range.

## 2020-06-23 ENCOUNTER — Other Ambulatory Visit: Payer: Self-pay

## 2020-06-23 ENCOUNTER — Ambulatory Visit
Admission: RE | Admit: 2020-06-23 | Discharge: 2020-06-23 | Disposition: A | Discharge status: Home / Self Care | Payer: PPO | Source: Ambulatory Visit | Attending: Neurology | Admitting: Neurology

## 2020-06-23 DIAGNOSIS — G40209 Localization-related (focal) (partial) symptomatic epilepsy and epileptic syndromes with complex partial seizures, not intractable, without status epilepticus: Secondary | ICD-10-CM | POA: Diagnosis not present

## 2020-06-23 DIAGNOSIS — C719 Malignant neoplasm of brain, unspecified: Secondary | ICD-10-CM

## 2020-06-23 MED ORDER — GADOBENATE DIMEGLUMINE 529 MG/ML IV SOLN
19.0000 mL | Freq: Once | INTRAVENOUS | Status: AC | PRN
  Administered 2020-06-23: 19 mL via INTRAVENOUS

## 2020-06-25 ENCOUNTER — Telehealth: Payer: Self-pay

## 2020-06-25 NOTE — Telephone Encounter (Signed)
-----   Message from Suzzanne Cloud, NP sent at 06/24/2020  1:59 PM EDT ----- MRI of the brain is stable, no evidence of recurrence. No changes compared to prior in June 2019. Good news :)  IMPRESSION: This MRI of the brain with and without contrast shows the following: 1.   Large postoperative cavity involving the left frontal lobe and extensive T2/FLAIR white matter changes in both hemispheres consistent with his prior history for astrocytoma surgical resection and radiation therapy.  No evidence of recurrence.  There is no enhancement of this region and no changes compared to the 04/21/2018 MRI. 2.   No acute findings and normal enhancement pattern.

## 2020-06-25 NOTE — Telephone Encounter (Signed)
[name] Howard White was notified of the message below.

## 2020-06-30 ENCOUNTER — Telehealth: Payer: Self-pay | Admitting: Neurology

## 2020-06-30 NOTE — Telephone Encounter (Signed)
[name], Terin Cragle dropping off paperwork for NP to fill out for continuation of benefit. Ms. Boody will drop off today or tomorrow. Ms. Cowans would like a call from the nurse when forms are ready to be picked up.

## 2020-06-30 NOTE — Telephone Encounter (Signed)
Noted  

## 2020-07-01 DIAGNOSIS — Z0289 Encounter for other administrative examinations: Secondary | ICD-10-CM

## 2020-07-03 NOTE — Telephone Encounter (Signed)
Called mother, [name] who stated patient used to live in [place], so she's unaware if disability papers were done before. I advised her Judson Roch is out of office, returns Monday. Paper work is due to [place] on 07/21/20. [name] stated that was fine; she verbalized understanding, appreciation for the call.

## 2020-07-09 NOTE — Telephone Encounter (Signed)
[employer] disability form completed, signed.  To MR.

## 2020-07-10 DIAGNOSIS — E039 Hypothyroidism, unspecified: Secondary | ICD-10-CM | POA: Diagnosis not present

## 2020-07-10 DIAGNOSIS — J40 Bronchitis, not specified as acute or chronic: Secondary | ICD-10-CM | POA: Diagnosis not present

## 2020-07-10 DIAGNOSIS — F322 Major depressive disorder, single episode, severe without psychotic features: Secondary | ICD-10-CM | POA: Diagnosis not present

## 2020-07-10 DIAGNOSIS — F331 Major depressive disorder, recurrent, moderate: Secondary | ICD-10-CM | POA: Diagnosis not present

## 2020-07-10 DIAGNOSIS — F3341 Major depressive disorder, recurrent, in partial remission: Secondary | ICD-10-CM | POA: Diagnosis not present

## 2020-07-10 DIAGNOSIS — F329 Major depressive disorder, single episode, unspecified: Secondary | ICD-10-CM | POA: Diagnosis not present

## 2020-07-10 DIAGNOSIS — G4700 Insomnia, unspecified: Secondary | ICD-10-CM | POA: Diagnosis not present

## 2020-07-15 DIAGNOSIS — H524 Presbyopia: Secondary | ICD-10-CM | POA: Diagnosis not present

## 2020-09-26 ENCOUNTER — Inpatient Hospital Stay (HOSPITAL_COMMUNITY): Payer: PPO

## 2020-09-26 ENCOUNTER — Emergency Department (HOSPITAL_COMMUNITY): Payer: PPO

## 2020-09-26 ENCOUNTER — Other Ambulatory Visit: Payer: Self-pay

## 2020-09-26 ENCOUNTER — Inpatient Hospital Stay (HOSPITAL_COMMUNITY)
Admission: EM | Admit: 2020-09-26 | Discharge: 2020-09-30 | DRG: 065 | Disposition: A | Discharge status: Rehabilitation Facility | Payer: PPO | Attending: Family Medicine | Admitting: Family Medicine

## 2020-09-26 DIAGNOSIS — Z79899 Other long term (current) drug therapy: Secondary | ICD-10-CM | POA: Diagnosis not present

## 2020-09-26 DIAGNOSIS — R29818 Other symptoms and signs involving the nervous system: Secondary | ICD-10-CM | POA: Diagnosis not present

## 2020-09-26 DIAGNOSIS — E039 Hypothyroidism, unspecified: Secondary | ICD-10-CM | POA: Diagnosis not present

## 2020-09-26 DIAGNOSIS — G8194 Hemiplegia, unspecified affecting left nondominant side: Secondary | ICD-10-CM | POA: Diagnosis present

## 2020-09-26 DIAGNOSIS — Z85841 Personal history of malignant neoplasm of brain: Secondary | ICD-10-CM

## 2020-09-26 DIAGNOSIS — C711 Malignant neoplasm of frontal lobe: Secondary | ICD-10-CM

## 2020-09-26 DIAGNOSIS — C719 Malignant neoplasm of brain, unspecified: Secondary | ICD-10-CM | POA: Diagnosis present

## 2020-09-26 DIAGNOSIS — Z20822 Contact with and (suspected) exposure to covid-19: Secondary | ICD-10-CM | POA: Diagnosis not present

## 2020-09-26 DIAGNOSIS — R29810 Facial weakness: Secondary | ICD-10-CM | POA: Diagnosis not present

## 2020-09-26 DIAGNOSIS — I10 Essential (primary) hypertension: Secondary | ICD-10-CM | POA: Diagnosis not present

## 2020-09-26 DIAGNOSIS — I6389 Other cerebral infarction: Secondary | ICD-10-CM | POA: Diagnosis not present

## 2020-09-26 DIAGNOSIS — R17 Unspecified jaundice: Secondary | ICD-10-CM | POA: Diagnosis not present

## 2020-09-26 DIAGNOSIS — I639 Cerebral infarction, unspecified: Principal | ICD-10-CM | POA: Diagnosis present

## 2020-09-26 DIAGNOSIS — G9389 Other specified disorders of brain: Secondary | ICD-10-CM | POA: Diagnosis not present

## 2020-09-26 DIAGNOSIS — F32A Depression, unspecified: Secondary | ICD-10-CM | POA: Diagnosis present

## 2020-09-26 DIAGNOSIS — R413 Other amnesia: Secondary | ICD-10-CM | POA: Diagnosis present

## 2020-09-26 DIAGNOSIS — I672 Cerebral atherosclerosis: Secondary | ICD-10-CM | POA: Diagnosis present

## 2020-09-26 DIAGNOSIS — R32 Unspecified urinary incontinence: Secondary | ICD-10-CM | POA: Diagnosis not present

## 2020-09-26 DIAGNOSIS — G479 Sleep disorder, unspecified: Secondary | ICD-10-CM | POA: Diagnosis not present

## 2020-09-26 DIAGNOSIS — H748X2 Other specified disorders of left middle ear and mastoid: Secondary | ICD-10-CM | POA: Diagnosis not present

## 2020-09-26 DIAGNOSIS — I6622 Occlusion and stenosis of left posterior cerebral artery: Secondary | ICD-10-CM | POA: Diagnosis not present

## 2020-09-26 DIAGNOSIS — I69354 Hemiplegia and hemiparesis following cerebral infarction affecting left non-dominant side: Secondary | ICD-10-CM | POA: Diagnosis not present

## 2020-09-26 DIAGNOSIS — R269 Unspecified abnormalities of gait and mobility: Secondary | ICD-10-CM | POA: Diagnosis not present

## 2020-09-26 DIAGNOSIS — G459 Transient cerebral ischemic attack, unspecified: Secondary | ICD-10-CM | POA: Diagnosis not present

## 2020-09-26 DIAGNOSIS — I6381 Other cerebral infarction due to occlusion or stenosis of small artery: Secondary | ICD-10-CM | POA: Diagnosis not present

## 2020-09-26 DIAGNOSIS — Z823 Family history of stroke: Secondary | ICD-10-CM | POA: Diagnosis not present

## 2020-09-26 DIAGNOSIS — R569 Unspecified convulsions: Secondary | ICD-10-CM | POA: Diagnosis not present

## 2020-09-26 DIAGNOSIS — G40209 Localization-related (focal) (partial) symptomatic epilepsy and epileptic syndromes with complex partial seizures, not intractable, without status epilepticus: Secondary | ICD-10-CM

## 2020-09-26 DIAGNOSIS — G40909 Epilepsy, unspecified, not intractable, without status epilepticus: Secondary | ICD-10-CM | POA: Diagnosis not present

## 2020-09-26 DIAGNOSIS — R001 Bradycardia, unspecified: Secondary | ICD-10-CM | POA: Diagnosis not present

## 2020-09-26 DIAGNOSIS — Z8673 Personal history of transient ischemic attack (TIA), and cerebral infarction without residual deficits: Secondary | ICD-10-CM

## 2020-09-26 DIAGNOSIS — W19XXXA Unspecified fall, initial encounter: Secondary | ICD-10-CM | POA: Diagnosis present

## 2020-09-26 DIAGNOSIS — Y92003 Bedroom of unspecified non-institutional (private) residence as the place of occurrence of the external cause: Secondary | ICD-10-CM

## 2020-09-26 DIAGNOSIS — D72829 Elevated white blood cell count, unspecified: Secondary | ICD-10-CM | POA: Diagnosis not present

## 2020-09-26 DIAGNOSIS — R9431 Abnormal electrocardiogram [ECG] [EKG]: Secondary | ICD-10-CM | POA: Diagnosis not present

## 2020-09-26 DIAGNOSIS — R29702 NIHSS score 2: Secondary | ICD-10-CM | POA: Diagnosis present

## 2020-09-26 DIAGNOSIS — I69392 Facial weakness following cerebral infarction: Secondary | ICD-10-CM | POA: Diagnosis not present

## 2020-09-26 DIAGNOSIS — K5901 Slow transit constipation: Secondary | ICD-10-CM | POA: Diagnosis not present

## 2020-09-26 DIAGNOSIS — E785 Hyperlipidemia, unspecified: Secondary | ICD-10-CM | POA: Diagnosis not present

## 2020-09-26 DIAGNOSIS — E739 Lactose intolerance, unspecified: Secondary | ICD-10-CM | POA: Diagnosis not present

## 2020-09-26 DIAGNOSIS — R531 Weakness: Secondary | ICD-10-CM | POA: Diagnosis not present

## 2020-09-26 DIAGNOSIS — R159 Full incontinence of feces: Secondary | ICD-10-CM | POA: Diagnosis not present

## 2020-09-26 DIAGNOSIS — Z79890 Hormone replacement therapy (postmenopausal): Secondary | ICD-10-CM | POA: Diagnosis not present

## 2020-09-26 DIAGNOSIS — I63311 Cerebral infarction due to thrombosis of right middle cerebral artery: Secondary | ICD-10-CM | POA: Diagnosis not present

## 2020-09-26 DIAGNOSIS — Z833 Family history of diabetes mellitus: Secondary | ICD-10-CM | POA: Diagnosis not present

## 2020-09-26 DIAGNOSIS — I6523 Occlusion and stenosis of bilateral carotid arteries: Secondary | ICD-10-CM | POA: Diagnosis not present

## 2020-09-26 DIAGNOSIS — G8110 Spastic hemiplegia affecting unspecified side: Secondary | ICD-10-CM | POA: Diagnosis not present

## 2020-09-26 DIAGNOSIS — M6281 Muscle weakness (generalized): Secondary | ICD-10-CM | POA: Diagnosis not present

## 2020-09-26 LAB — I-STAT CHEM 8, ED
BUN: 23 mg/dL — ABNORMAL HIGH (ref 6–20)
Calcium, Ion: 1.17 mmol/L (ref 1.15–1.40)
Chloride: 101 mmol/L (ref 98–111)
Creatinine, Ser: 1 mg/dL (ref 0.61–1.24)
Glucose, Bld: 97 mg/dL (ref 70–99)
HCT: 47 % (ref 39.0–52.0)
Hemoglobin: 16 g/dL (ref 13.0–17.0)
Potassium: 4.9 mmol/L (ref 3.5–5.1)
Sodium: 137 mmol/L (ref 135–145)
TCO2: 28 mmol/L (ref 22–32)

## 2020-09-26 LAB — COMPREHENSIVE METABOLIC PANEL
ALT: 22 U/L (ref 0–44)
AST: 39 U/L (ref 15–41)
Albumin: 4.2 g/dL (ref 3.5–5.0)
Alkaline Phosphatase: 44 U/L (ref 38–126)
Anion gap: 8 (ref 5–15)
BUN: 17 mg/dL (ref 6–20)
CO2: 27 mmol/L (ref 22–32)
Calcium: 9.4 mg/dL (ref 8.9–10.3)
Chloride: 101 mmol/L (ref 98–111)
Creatinine, Ser: 1.1 mg/dL (ref 0.61–1.24)
GFR, Estimated: >60 mL/min (ref >60)
Glucose, Bld: 101 mg/dL — ABNORMAL HIGH (ref 70–99)
Potassium: 5.5 mmol/L — ABNORMAL HIGH (ref 3.5–5.1)
Sodium: 136 mmol/L (ref 135–145)
Total Bilirubin: 1 mg/dL (ref 0.3–1.2)
Total Protein: 6.4 g/dL — ABNORMAL LOW (ref 6.5–8.1)

## 2020-09-26 LAB — HIV ANTIBODY (ROUTINE TESTING W REFLEX): HIV Screen 4th Generation wRfx: NONREACTIVE

## 2020-09-26 LAB — CBC
HCT: 46.4 % (ref 39.0–52.0)
HCT: 46.2 % (ref 39.0–52.0)
Hemoglobin: 15.5 g/dL (ref 13.0–17.0)
Hemoglobin: 15.2 g/dL (ref 13.0–17.0)
MCH: 31.5 pg (ref 26.0–34.0)
MCH: 31 pg (ref 26.0–34.0)
MCHC: 33.4 g/dL (ref 30.0–36.0)
MCHC: 32.9 g/dL (ref 30.0–36.0)
MCV: 94.3 fL (ref 80.0–100.0)
MCV: 94.3 fL (ref 80.0–100.0)
Platelets: 230 10*3/uL (ref 150–400)
Platelets: 256 10*3/uL (ref 150–400)
RBC: 4.92 MIL/uL (ref 4.22–5.81)
RBC: 4.9 MIL/uL (ref 4.22–5.81)
RDW: 12.1 % (ref 11.5–15.5)
RDW: 12.1 % (ref 11.5–15.5)
WBC: 6.8 10*3/uL (ref 4.0–10.5)
WBC: 7.4 10*3/uL (ref 4.0–10.5)
nRBC: 0 % (ref 0.0–0.2)
nRBC: 0 % (ref 0.0–0.2)

## 2020-09-26 LAB — CBG MONITORING, ED: Glucose-Capillary: 95 mg/dL (ref 70–99)

## 2020-09-26 LAB — DIFFERENTIAL
Abs Immature Granulocytes: 0.01 10*3/uL (ref 0.00–0.07)
Basophils Absolute: 0 10*3/uL (ref 0.0–0.1)
Basophils Relative: 0 %
Eosinophils Absolute: 0.4 10*3/uL (ref 0.0–0.5)
Eosinophils Relative: 5 %
Immature Granulocytes: 0 %
Lymphocytes Relative: 29 %
Lymphs Abs: 2 10*3/uL (ref 0.7–4.0)
Monocytes Absolute: 0.9 10*3/uL (ref 0.1–1.0)
Monocytes Relative: 13 %
Neutro Abs: 3.6 10*3/uL (ref 1.7–7.7)
Neutrophils Relative %: 53 %

## 2020-09-26 LAB — RESPIRATORY PANEL BY RT PCR (FLU A&B, COVID)
Influenza A by PCR: NEGATIVE
Influenza B by PCR: NEGATIVE
SARS Coronavirus 2 by RT PCR: NEGATIVE

## 2020-09-26 LAB — CREATININE, SERUM
Creatinine, Ser: 1.03 mg/dL (ref 0.61–1.24)
GFR, Estimated: >60 mL/min (ref >60)

## 2020-09-26 LAB — PROTIME-INR
INR: 1 (ref 0.8–1.2)
Prothrombin Time: 12.4 seconds (ref 11.4–15.2)

## 2020-09-26 LAB — APTT: aPTT: 28 seconds (ref 24–36)

## 2020-09-26 MED ORDER — LAMOTRIGINE 25 MG PO TABS
225.0000 mg | ORAL_TABLET | Freq: Every day | ORAL | Status: DC
  Administered 2020-09-27 – 2020-09-30 (×4): 225 mg via ORAL
  Filled 2020-09-26 (×2): qty 2
  Filled 2020-09-26: qty 1
  Filled 2020-09-26: qty 2

## 2020-09-26 MED ORDER — FAMOTIDINE 20 MG PO TABS
20.0000 mg | ORAL_TABLET | Freq: Every day | ORAL | Status: DC
  Administered 2020-09-27 – 2020-09-30 (×4): 20 mg via ORAL
  Filled 2020-09-26 (×4): qty 1

## 2020-09-26 MED ORDER — ACETAMINOPHEN 650 MG RE SUPP: 650.0000 mg | Freq: Four times a day (QID) | RECTAL | Status: DC | PRN

## 2020-09-26 MED ORDER — IOHEXOL 350 MG/ML SOLN
100.0000 mL | Freq: Once | INTRAVENOUS | Status: AC | PRN
  Administered 2020-09-26: 100 mL via INTRAVENOUS

## 2020-09-26 MED ORDER — ADULT MULTIVITAMIN W/MINERALS CH
1.0000 | ORAL_TABLET | Freq: Every day | ORAL | Status: DC
  Administered 2020-09-26 – 2020-09-30 (×5): 1 via ORAL
  Filled 2020-09-26 (×5): qty 1

## 2020-09-26 MED ORDER — ACETAMINOPHEN 500 MG PO TABS: 1000.0000 mg | ORAL_TABLET | Freq: Four times a day (QID) | ORAL | Status: DC | PRN

## 2020-09-26 MED ORDER — ATORVASTATIN CALCIUM 40 MG PO TABS
40.0000 mg | ORAL_TABLET | Freq: Every day | ORAL | Status: DC
  Administered 2020-09-27 – 2020-09-30 (×4): 40 mg via ORAL
  Filled 2020-09-26 (×4): qty 1

## 2020-09-26 MED ORDER — ENOXAPARIN SODIUM 40 MG/0.4ML ~~LOC~~ SOLN
40.0000 mg | SUBCUTANEOUS | Status: DC
  Administered 2020-09-26: 40 mg via SUBCUTANEOUS
  Filled 2020-09-26: qty 0.4

## 2020-09-26 MED ORDER — ASPIRIN EC 81 MG PO TBEC
81.0000 mg | DELAYED_RELEASE_TABLET | Freq: Every day | ORAL | Status: DC
  Administered 2020-09-27 – 2020-09-30 (×4): 81 mg via ORAL
  Filled 2020-09-26 (×4): qty 1

## 2020-09-26 MED ORDER — ONDANSETRON HCL 4 MG PO TABS: 4.0000 mg | ORAL_TABLET | Freq: Four times a day (QID) | ORAL | Status: DC | PRN

## 2020-09-26 MED ORDER — SODIUM CHLORIDE 0.9% FLUSH
3.0000 mL | Freq: Once | INTRAVENOUS | Status: AC
Start: 2020-09-26 — End: 2020-09-26
  Administered 2020-09-26: 3 mL via INTRAVENOUS

## 2020-09-26 MED ORDER — ASPIRIN 325 MG PO TABS: 325.0000 mg | ORAL_TABLET | Freq: Once | ORAL | Status: AC

## 2020-09-26 MED ORDER — WHITE PETROLATUM EX OINT
TOPICAL_OINTMENT | CUTANEOUS | Status: DC | PRN
  Administered 2020-09-26: 0.2 via TOPICAL
  Filled 2020-09-26: qty 28.35

## 2020-09-26 MED ORDER — CLOPIDOGREL BISULFATE 75 MG PO TABS
75.0000 mg | ORAL_TABLET | Freq: Every day | ORAL | Status: DC
  Administered 2020-09-27 – 2020-09-30 (×4): 75 mg via ORAL
  Filled 2020-09-26 (×4): qty 1

## 2020-09-26 MED ORDER — ACETAMINOPHEN 325 MG PO TABS: 650.0000 mg | ORAL_TABLET | Freq: Four times a day (QID) | ORAL | Status: DC | PRN

## 2020-09-26 MED ORDER — LAMOTRIGINE 150 MG PO TABS
300.0000 mg | ORAL_TABLET | Freq: Every day | ORAL | Status: DC
  Administered 2020-09-26 – 2020-09-29 (×4): 300 mg via ORAL
  Filled 2020-09-26 (×6): qty 2

## 2020-09-26 MED ORDER — ASPIRIN 325 MG PO TABS
ORAL_TABLET | ORAL | Status: AC
  Administered 2020-09-26: 325 mg
  Filled 2020-09-26: qty 1

## 2020-09-26 MED ORDER — STROKE: EARLY STAGES OF RECOVERY BOOK: Freq: Once | Status: DC

## 2020-09-26 MED ORDER — ESCITALOPRAM OXALATE 10 MG PO TABS
10.0000 mg | ORAL_TABLET | Freq: Every day | ORAL | Status: DC
  Administered 2020-09-27 – 2020-09-30 (×4): 10 mg via ORAL
  Filled 2020-09-26 (×4): qty 1

## 2020-09-26 MED ORDER — FLUTICASONE PROPIONATE 50 MCG/ACT NA SUSP
2.0000 | Freq: Every day | NASAL | Status: DC | PRN
  Administered 2020-09-29 – 2020-09-30 (×2): 2 via NASAL
  Filled 2020-09-26 (×2): qty 16

## 2020-09-26 MED ORDER — ONDANSETRON HCL 4 MG/2ML IJ SOLN: 4.0000 mg | Freq: Four times a day (QID) | INTRAMUSCULAR | Status: DC | PRN

## 2020-09-26 MED ORDER — LEVOTHYROXINE SODIUM 112 MCG PO TABS
112.0000 ug | ORAL_TABLET | Freq: Every day | ORAL | Status: DC
  Administered 2020-09-26 – 2020-09-29 (×4): 112 ug via ORAL
  Filled 2020-09-26 (×4): qty 1

## 2020-09-26 NOTE — H&P (Signed)
History and Physical    Howard White JSE:[id number] DOB: 1961/07/26 DOA: 09/26/2020  PCP: Seward Carol, MD   Patient coming from: Home   Chief Complaint: left sided weakness   HPI: Howard White is a 59 y.o. male with medical history significant of brain cancer, seizures and hypothyroidism.  Patient has been at his usual state of health until this afternoon around 1:30 PM when he noticed left sided weakness and left facial droop.  He was laying in bed, and when he tried to stand up he notices focal neurologic deficit.  Due to persistent symptoms he was brought into the hospital for further evaluation. His left-sided weakness is mainly affecting his left upper extremity and left face, is moderate in intensity, persistent in nature, no improving or worsening factors and not associated with paresthesias or swallow dysfunction.  He has been seizure-free for about 5 years, a recent neurologic follow-up 4 months ago was normal. When he was diagnosed with his brain cancer he was symptomatic on the right side of his body.  ED Course: Patient with persistent left-sided weakness, left facial droop, CT head negative for acute changes, neurology was consulted and referred for further hospitalization.  Review of Systems:  1. General: No fevers, no chills, no weight gain or weight loss 2. ENT: No runny nose or sore throat, no hearing disturbances 3. Pulmonary: No dyspnea, cough, wheezing, or hemoptysis 4. Cardiovascular: No angina, claudication, lower extremity edema, pnd or orthopnea 5. Gastrointestinal: No nausea or vomiting, no diarrhea or constipation 6. Hematology: No easy bruisability or frequent infections 7. Urology: No dysuria, hematuria or increased urinary frequency 8. Dermatology: No rashes. 9. Neurology: No seizures or paresthesias/ left facial droop and left sided weakness as mentioned in HPI.  10. Musculoskeletal: No joint pain or deformities  Past Medical History:  Diagnosis Date   . Brain cancer ([place])   . Seizures ([place])   . Thyroid disease    Hypothyroid    Past Surgical History:  Procedure Laterality Date  . brain cancer    . BRAIN SURGERY       reports that he has never smoked. He has never used smokeless tobacco. He reports current alcohol use. He reports that he does not use drugs.  Allergies  Allergen Reactions  . Lactose Intolerance (Gi)     Family History  Problem Relation Age of Onset  . Brain cancer Unknown   . Thyroid disease Unknown   . Cancer Unknown   . Diabetes Unknown   . Stroke Unknown      Prior to Admission medications   Medication Sig Start Date End Date Taking? Authorizing Provider  acetaminophen (TYLENOL) 500 MG tablet Take 1,000 mg by mouth every 6 (six) hours as needed for mild pain.   Yes [provider]  escitalopram (LEXAPRO) 10 MG tablet Take 10 mg by mouth daily. 05/11/18  Yes [provider]  famotidine (PEPCID) 20 MG tablet Take 20 mg by mouth daily.   Yes [provider]  fluticasone (FLONASE) 50 MCG/ACT nasal spray Place 2 sprays into both nostrils daily as needed for allergies.  02/05/13  Yes [provider]  lamoTRIgine (LAMICTAL) 150 MG tablet TAKE 1 AND 1/2 TABS EVERY MORNING AND 2 TAB EVERY EVENING Patient taking differently: Take 225-300 mg by mouth See admin instructions. TAKE 1 AND 1/2 TABS ( 225mg )  EVERY MORNING AND 2 TAB (300mg ) EVERY EVENING 05/06/20  Yes Suzzanne Cloud, NP  Multiple Vitamins-Minerals (MULTIVITAMIN WITH MINERALS) tablet Take 1  tablet by mouth daily.   Yes [provider]  SYNTHROID 112 MCG tablet Take 112 mcg by mouth at bedtime.  03/02/20  Yes [provider]    Physical Exam: Vitals:   09/26/20 1645 09/26/20 1751 09/26/20 1800 09/26/20 1815  BP: 130/85 140/82 (!) 132/96 (!) 142/88  Pulse: 63 (!) 59 (!) 48 62  Resp: 15 12 18 16   Temp:      TempSrc:      SpO2: 94% 96% 90% 95%  Weight:      Height:        Vitals:   09/26/20 1645  09/26/20 1751 09/26/20 1800 09/26/20 1815  BP: 130/85 140/82 (!) 132/96 (!) 142/88  Pulse: 63 (!) 59 (!) 48 62  Resp: 15 12 18 16   Temp:      TempSrc:      SpO2: 94% 96% 90% 95%  Weight:      Height:       General: Not in pain or dyspnea  Neurology: Awake and alert, left facial droop, decreased proximal and distal strength on left upper extremity, left leg and right side is preserved.  Head and Neck. Head normocephalic. Neck supple with no adenopathy or thyromegaly.   E ENT: mild pallor, no icterus, oral mucosa moist Cardiovascular: No JVD. S1-S2 present, rhythmic, no gallops, rubs, or murmurs. No lower extremity edema. Pulmonary: positive breath sounds bilaterally, with no wheezing, rhonchi or rales. Gastrointestinal. Abdomen soft and non tender Skin. No rashes Musculoskeletal: no joint deformities    Labs on Admission: I have personally reviewed following labs and imaging studies  CBC: Recent Labs  Lab 09/26/20 1419 09/26/20 1432  WBC 6.8  --   NEUTROABS 3.6  --   HGB 15.5 16.0  HCT 46.4 47.0  MCV 94.3  --   PLT 230  --    Basic Metabolic Panel: Recent Labs  Lab 09/26/20 1419 09/26/20 1432  NA 136 137  K 5.5* 4.9  CL 101 101  CO2 27  --   GLUCOSE 101* 97  BUN 17 23*  CREATININE 1.10 1.00  CALCIUM 9.4  --    GFR: Estimated Creatinine Clearance: 83.1 mL/min (by C-G formula based on SCr of 1 mg/dL). Liver Function Tests: Recent Labs  Lab 09/26/20 1419  AST 39  ALT 22  ALKPHOS 44  BILITOT 1.0  PROT 6.4*  ALBUMIN 4.2   No results for input(s): LIPASE, AMYLASE in the last 168 hours. No results for input(s): AMMONIA in the last 168 hours. Coagulation Profile: Recent Labs  Lab 09/26/20 1419  INR 1.0   Cardiac Enzymes: No results for input(s): CKTOTAL, CKMB, CKMBINDEX, TROPONINI in the last 168 hours. BNP (last 3 results) No results for input(s): PROBNP in the last 8760 hours. HbA1C: No results for input(s): HGBA1C in the last 72  hours. CBG: Recent Labs  Lab 09/26/20 1418  GLUCAP 95   Lipid Profile: No results for input(s): CHOL, HDL, LDLCALC, TRIG, CHOLHDL, LDLDIRECT in the last 72 hours. Thyroid Function Tests: No results for input(s): TSH, T4TOTAL, FREET4, T3FREE, THYROIDAB in the last 72 hours. Anemia Panel: No results for input(s): VITAMINB12, FOLATE, FERRITIN, TIBC, IRON, RETICCTPCT in the last 72 hours. Urine analysis: No results found for: COLORURINE, APPEARANCEUR, LABSPEC, [place], GLUCOSEU, [place], [place], [place], PROTEINUR, UROBILINOGEN, NITRITE, LEUKOCYTESUR  Radiological Exams on Admission: CT Angio Head W or Wo Contrast  Result Date: 09/26/2020 CLINICAL DATA:  Left-sided facial deficits. Left arm and leg weakness. History of brain tumor and seizures.  EXAM: CT ANGIOGRAPHY HEAD AND NECK CT PERFUSION BRAIN TECHNIQUE: Multidetector CT imaging of the head and neck was performed using the standard protocol during bolus administration of intravenous contrast. Multiplanar CT image reconstructions and MIPs were obtained to evaluate the vascular anatomy. Carotid stenosis measurements (when applicable) are obtained utilizing NASCET criteria, using the distal internal carotid diameter as the denominator. Multiphase CT imaging of the brain was performed following IV bolus contrast injection. Subsequent parametric perfusion maps were calculated using RAPID software. CONTRAST:  162mL OMNIPAQUE IOHEXOL 350 MG/ML SOLN COMPARISON:  None. FINDINGS: CTA NECK FINDINGS Aortic arch: Standard 3 vessel aortic arch with widely patent arch vessel origins. Right carotid system: Patent without evidence of stenosis, dissection, or significant atherosclerosis. Left carotid system: Patent without evidence of stenosis, dissection, or significant atherosclerosis. Vertebral arteries: Patent without evidence of stenosis, dissection, or significant atherosclerosis. Mildly dominant right vertebral artery. Mildly limited assessment of the  left vertebral artery origin due to motion artifact. Skeleton: Advanced disc degeneration at C4-5, C5-6, and C6-7. Moderate to severe neural foraminal stenosis on the left at C5-6 and on the right at C6-7 due to uncovertebral spurring. Other neck: No evidence of cervical lymphadenopathy or mass. Upper chest: Mild dependent atelectasis in the lung apices. Review of the MIP images confirms the above findings CTA HEAD FINDINGS Anterior circulation: The internal carotid arteries are patent from skull base to carotid termini with minimal nonstenotic plaque bilaterally. ACAs and MCAs are patent with mild branch vessel irregularity but no evidence of a proximal branch occlusion or flow limiting proximal stenosis. No aneurysm is identified. Posterior circulation: The intracranial vertebral arteries are widely patent to the basilar. Patent left PICA, right AICA, and bilateral SCA origins are identified. The basilar artery is widely patent. There is a fetal origin of the left PCA. Both PCAs are patent with branch vessel irregularity but no evidence of a flow limiting proximal stenosis. There is a severe left P3 branch vessel stenosis. No aneurysm is identified. Venous sinuses: Patent. Anatomic variants: Fetal left PCA. Review of the MIP images confirms the above findings CT Brain Perfusion Findings: ASPECTS: 10 CBF (<30%) Volume: 0 mL Perfusion (Tmax>6.0s) volume: 0 mL Mismatch Volume: 0 mL Infarction Location: N/a IMPRESSION: 1. Intracranial atherosclerosis without large vessel occlusion or flow limiting proximal stenosis. 2. Widely patent cervical carotid and vertebral arteries allowing for motion through the left vertebral origin. 3. No evidence of acute ischemia on CTP. These results were communicated to Dr. Theda Sers at 2:49 pm on 09/26/2020 by text page via the [facility] messaging system. Electronically Signed   By: Logan Bores M.D.   On: 09/26/2020 14:56   CT Angio Neck W and/or Wo Contrast  Result Date:  09/26/2020 CLINICAL DATA:  Left-sided facial deficits. Left arm and leg weakness. History of brain tumor and seizures. EXAM: CT ANGIOGRAPHY HEAD AND NECK CT PERFUSION BRAIN TECHNIQUE: Multidetector CT imaging of the head and neck was performed using the standard protocol during bolus administration of intravenous contrast. Multiplanar CT image reconstructions and MIPs were obtained to evaluate the vascular anatomy. Carotid stenosis measurements (when applicable) are obtained utilizing NASCET criteria, using the distal internal carotid diameter as the denominator. Multiphase CT imaging of the brain was performed following IV bolus contrast injection. Subsequent parametric perfusion maps were calculated using RAPID software. CONTRAST:  158mL OMNIPAQUE IOHEXOL 350 MG/ML SOLN COMPARISON:  None. FINDINGS: CTA NECK FINDINGS Aortic arch: Standard 3 vessel aortic arch with widely patent arch vessel origins. Right carotid system: Patent without evidence  of stenosis, dissection, or significant atherosclerosis. Left carotid system: Patent without evidence of stenosis, dissection, or significant atherosclerosis. Vertebral arteries: Patent without evidence of stenosis, dissection, or significant atherosclerosis. Mildly dominant right vertebral artery. Mildly limited assessment of the left vertebral artery origin due to motion artifact. Skeleton: Advanced disc degeneration at C4-5, C5-6, and C6-7. Moderate to severe neural foraminal stenosis on the left at C5-6 and on the right at C6-7 due to uncovertebral spurring. Other neck: No evidence of cervical lymphadenopathy or mass. Upper chest: Mild dependent atelectasis in the lung apices. Review of the MIP images confirms the above findings CTA HEAD FINDINGS Anterior circulation: The internal carotid arteries are patent from skull base to carotid termini with minimal nonstenotic plaque bilaterally. ACAs and MCAs are patent with mild branch vessel irregularity but no evidence of a  proximal branch occlusion or flow limiting proximal stenosis. No aneurysm is identified. Posterior circulation: The intracranial vertebral arteries are widely patent to the basilar. Patent left PICA, right AICA, and bilateral SCA origins are identified. The basilar artery is widely patent. There is a fetal origin of the left PCA. Both PCAs are patent with branch vessel irregularity but no evidence of a flow limiting proximal stenosis. There is a severe left P3 branch vessel stenosis. No aneurysm is identified. Venous sinuses: Patent. Anatomic variants: Fetal left PCA. Review of the MIP images confirms the above findings CT Brain Perfusion Findings: ASPECTS: 10 CBF (<30%) Volume: 0 mL Perfusion (Tmax>6.0s) volume: 0 mL Mismatch Volume: 0 mL Infarction Location: N/a IMPRESSION: 1. Intracranial atherosclerosis without large vessel occlusion or flow limiting proximal stenosis. 2. Widely patent cervical carotid and vertebral arteries allowing for motion through the left vertebral origin. 3. No evidence of acute ischemia on CTP. These results were communicated to Dr. Theda Sers at 2:49 pm on 09/26/2020 by text page via the [facility] messaging system. Electronically Signed   By: Logan Bores M.D.   On: 09/26/2020 14:56   MR BRAIN WO CONTRAST  Result Date: 09/26/2020 CLINICAL DATA:  Neuro deficit, acute, stroke suspected. Left-sided weakness. EXAM: MRI HEAD WITHOUT CONTRAST TECHNIQUE: Multiplanar, multiecho pulse sequences of the brain and surrounding structures were obtained without intravenous contrast. COMPARISON:  Noncontrast head CT, CT angiogram head/neck and CT perfusion performed earlier the same day 09/26/2020. Brain MRI 06/23/2020. FINDINGS: Brain: 1.9 x 0.5 x 1.2 cm (AP x TV x CC) focus of restricted diffusion extending from the right corona radiata inferiorly into the right basal ganglia, consistent with acute infarction. Large resection cavity within the left frontal lobe with a small amount of chronic blood  products at the periphery. Associated ex vacuo dilatation of the left lateral ventricle. Redemonstrated extensive confluent T2/FLAIR hyperintense signal abnormality throughout the bilateral cerebral white matter. Chronic small vessel ischemic changes within the deep gray nuclei. No non-contrast evidence of intracranial mass. No extra-axial fluid collection. No midline shift. Vascular: Expected proximal arterial flow voids. Skull and upper cervical spine: Left frontoparietal cranioplasty. No focal marrow lesion. Sinuses/Orbits: Visualized orbits show no acute finding. No significant paranasal sinus disease. Trace fluid within the left mastoid air cells. IMPRESSION: 1.9 x 1.2 cm acute infarct extending from the right corona radiata into the right basal ganglia. Chronic findings which are stable as compared to the MRI of 06/23/2020, as follows. Prior left frontoparietal craniotomy with left frontal lobe resection cavity. Extensive T2 hyperintense signal abnormality throughout the cerebral white matter, nonspecific but likely reflecting a combination of post-treatment changes and chronic small vessel ischemic disease. Chronic small vessel  ischemic changes are also present within the deep gray nuclei. Electronically Signed   By: Kellie Simmering DO   On: 09/26/2020 18:05   CT CEREBRAL PERFUSION W CONTRAST  Result Date: 09/26/2020 CLINICAL DATA:  Left-sided facial deficits. Left arm and leg weakness. History of brain tumor and seizures. EXAM: CT ANGIOGRAPHY HEAD AND NECK CT PERFUSION BRAIN TECHNIQUE: Multidetector CT imaging of the head and neck was performed using the standard protocol during bolus administration of intravenous contrast. Multiplanar CT image reconstructions and MIPs were obtained to evaluate the vascular anatomy. Carotid stenosis measurements (when applicable) are obtained utilizing NASCET criteria, using the distal internal carotid diameter as the denominator. Multiphase CT imaging of the brain was  performed following IV bolus contrast injection. Subsequent parametric perfusion maps were calculated using RAPID software. CONTRAST:  151mL OMNIPAQUE IOHEXOL 350 MG/ML SOLN COMPARISON:  None. FINDINGS: CTA NECK FINDINGS Aortic arch: Standard 3 vessel aortic arch with widely patent arch vessel origins. Right carotid system: Patent without evidence of stenosis, dissection, or significant atherosclerosis. Left carotid system: Patent without evidence of stenosis, dissection, or significant atherosclerosis. Vertebral arteries: Patent without evidence of stenosis, dissection, or significant atherosclerosis. Mildly dominant right vertebral artery. Mildly limited assessment of the left vertebral artery origin due to motion artifact. Skeleton: Advanced disc degeneration at C4-5, C5-6, and C6-7. Moderate to severe neural foraminal stenosis on the left at C5-6 and on the right at C6-7 due to uncovertebral spurring. Other neck: No evidence of cervical lymphadenopathy or mass. Upper chest: Mild dependent atelectasis in the lung apices. Review of the MIP images confirms the above findings CTA HEAD FINDINGS Anterior circulation: The internal carotid arteries are patent from skull base to carotid termini with minimal nonstenotic plaque bilaterally. ACAs and MCAs are patent with mild branch vessel irregularity but no evidence of a proximal branch occlusion or flow limiting proximal stenosis. No aneurysm is identified. Posterior circulation: The intracranial vertebral arteries are widely patent to the basilar. Patent left PICA, right AICA, and bilateral SCA origins are identified. The basilar artery is widely patent. There is a fetal origin of the left PCA. Both PCAs are patent with branch vessel irregularity but no evidence of a flow limiting proximal stenosis. There is a severe left P3 branch vessel stenosis. No aneurysm is identified. Venous sinuses: Patent. Anatomic variants: Fetal left PCA. Review of the MIP images confirms the  above findings CT Brain Perfusion Findings: ASPECTS: 10 CBF (<30%) Volume: 0 mL Perfusion (Tmax>6.0s) volume: 0 mL Mismatch Volume: 0 mL Infarction Location: N/a IMPRESSION: 1. Intracranial atherosclerosis without large vessel occlusion or flow limiting proximal stenosis. 2. Widely patent cervical carotid and vertebral arteries allowing for motion through the left vertebral origin. 3. No evidence of acute ischemia on CTP. These results were communicated to Dr. Theda Sers at 2:49 pm on 09/26/2020 by text page via the [facility] messaging system. Electronically Signed   By: Logan Bores M.D.   On: 09/26/2020 14:56   CT HEAD CODE STROKE WO CONTRAST  Result Date: 09/26/2020 CLINICAL DATA:  Code stroke. Left-sided facial deficits. Left arm and leg weakness. Personal history of brain tumor and seizures. EXAM: CT HEAD WITHOUT CONTRAST TECHNIQUE: Contiguous axial images were obtained from the base of the skull through the vertex without intravenous contrast. COMPARISON:  MR head without and with contrast 06/23/2020 and 04/21/2018. FINDINGS: Brain: Left frontal craniotomy again noted. Surgical cavity evident. Diffuse white matter disease is again seen. Remote lacunar infarct is present in the right basal ganglia. No significant changes  present. Insular ribbon is normal bilaterally. Cortex is unremarkable. Brainstem and cerebellum are with in normal limits. Vascular: No hyperdense vessel or unexpected calcification. Skull: Left frontal craniotomy noted. Calvarium is otherwise intact. No focal lytic or blastic lesions are present. No significant extracranial soft tissue lesion is present. Sinuses/Orbits: The paranasal sinuses and mastoid air cells are clear. The globes and orbits are within normal limits. ASPECTS [facility] Stroke Program Early CT Score) - Ganglionic level infarction (caudate, lentiform nuclei, internal capsule, insula, M1-M3 cortex): 7/7 - Supraganglionic infarction (M4-M6 cortex): 3/3 Total score (0-10 with 10  being normal): 10/10 IMPRESSION: 1. No acute intracranial abnormality or significant interval change. 2. Stable atrophy and white matter disease. 3. Left frontal craniotomy with resection cavity. 4. Aspects 10/10 5. The above was relayed via text pager to Dr. Theda Sers on 09/26/2020 at 14:32 . Electronically Signed   By: [place] Morelle M.D.   On: 09/26/2020 14:33    EKG: Independently reviewed.  62 bpm, normal axis, normal intervals, sinus rhythm, no ST segment or T wave changes.  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  His symptoms have been persistent.  On his initial physical examination blood pressure 132/96, heart rate 62, respirate 18, oxygen saturation 95%.  His lungs are clear to auscultation bilaterally, heart S1-S2, present rhythmic, soft abdomen, no lower extremity edema, he does have left facial droop and decreased strength on the left upper extremity 4/5. Sodium 136, potassium 5.5, chloride 101, bicarb 27, glucose 1 1, BUN 17, creatinine 1.1, white count 6.8, hemoglobin 15.5 hematocrit 46.6, platelets 230.  SARS COVID-19 negative.  Head CT no acute intracranial abnormalities.  Stable atrophy and white matter disease.  Left frontal craniotomy with resection cavity.  Mr. Paulos will be admitted to the hospital working diagnosis of acute focal neurologic deficit likely due to acute CVA.  1.  Acute CVA.  Admit to medical telemetry unit, continue neurochecks every 4 hours, continue further work-up with CT angiography, and brain MRI. PT/OT, speech evaluation.  Follow-up with neurology recommendations.  2.  Epilepsy, seems to be well controlled.  Continue with  antiseizure medications, lamotrigine.   3.  History of brain astrocytoma.  No signs of recurrence.  4. Depression. Continue with escitalopram.   5. Hypothyroid. Continue levothyroxine.   Status is: Inpatient  Remains inpatient appropriate because:Inpatient level of care appropriate due to severity of illness   Dispo: The patient is from: Home              Anticipated d/c is to: Home              Anticipated d/c date is: 2 days              Patient currently is not medically stable to d/c.   DVT prophylaxis:  enoxaparin   Code Status:   full  Family Communication:  I spoke with patient's mother at the bedside, we talked in detail about patient's condition, plan of care and prognosis and all questions were addressed.     Consults called:  Neurology   Admission status:  Inpatient    Saraiah Bhat Gerome Apley MD [facility]   09/26/2020, 6:26 PM

## 2020-09-26 NOTE — Code Documentation (Signed)
Pt is a 59 yr old male brought in by [facility] for evaluation of sudden left sided weakness. Pt was last known well today at 1300 when he suddenly was weak on the left arm and leg. Code stroke was activated by EMS at 1358 and he arrived to out ED at 1415.  Pt was assessed at the bridge and found to have weakness in left arm and leg. NIHSS 2. Pt taken to CT at 1420. Non-contrast CT performed and was negative for acute hemorrhage per Dr. Theda Sers. Pt was not a TPA /thrombolitic candidate as he has a history of intracranial neoplasm. CTA was started at 1433. Per Dr Theda Sers, scan negative for LVO.  Pt not a candidate for NIR as CTA and clinical exam are negative for LVO. Pt was returned to room 34 in ED. A stroke swallow screen was obtained, and then Asprin 325 given po per order. Pt will need q 2 hour vital signs and NIHSS for 12 hours then q 4 hours. Bedside handoff with [facility] RN complete.

## 2020-09-26 NOTE — ED Triage Notes (Signed)
BIB [facility] w/ stroke symptoms. Pt reports a last known well at 1300 hours. EMS reported a left sided facial droop, left arm and leg weakness along with decreased sensation. Pt fell down while reading in the bed and couldn't get up due to legs giving out. Pt denies any loss of consciousness and denies a blood thinner. Pt has a hx of a intracranial neoplasm, and seizures. Vitals per EMS: BP: 150/70  HR- 60 SR on EKG CBG 90 Resp 16 SpO2 98%.

## 2020-09-26 NOTE — Consult Note (Signed)
Neurology Consult H&P  CC: code stroke  History is obtained from: patient, EMS  HPI: Howard White is a 59 y.o. 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 He presents as a code stroke. Pt states his LKW was 1300 hrs. He was sitting on his bed reading and could not get up due to leg weakness. He also noted right arm weakness and called 911. No dysphagia or visual disturbance.    LKW: 1300 hrs tpa given?: No. Not a candidate due to hx of brain tumor IR Thrombectomy? No  NIHSS: 2  ROS: A complete ROS was performed and is negative except as noted in the HPI.   Past Medical History:  Diagnosis Date  . Brain cancer ([place])   . Seizures ([place])   . Thyroid disease    Hypothyroid     Family History  Problem Relation Age of Onset  . Brain cancer Unknown   . Thyroid disease Unknown   . Cancer Unknown   . Diabetes Unknown   . Stroke Unknown     Social History:  reports that he has never smoked. He has never used smokeless tobacco. He reports current alcohol use. He reports that he does not use drugs.   Prior to Admission medications   Medication Sig Start Date End Date Taking? Authorizing Provider  acetaminophen (TYLENOL) 500 MG tablet Take 1,000 mg by mouth every 6 (six) hours as needed for mild pain.   Yes [provider]  escitalopram (LEXAPRO) 10 MG tablet Take 10 mg by mouth daily. 05/11/18  Yes [provider]  famotidine (PEPCID) 20 MG tablet Take 20 mg by mouth daily.   Yes [provider]  fluticasone (FLONASE) 50 MCG/ACT nasal spray Place 2 sprays into both nostrils daily as needed for allergies.  02/05/13  Yes [provider]  lamoTRIgine (LAMICTAL) 150 MG tablet TAKE 1 AND 1/2 TABS EVERY MORNING AND 2 TAB EVERY EVENING Patient taking differently: Take 225-300 mg by mouth See admin instructions. TAKE 1 AND 1/2 TABS ( 225mg )  EVERY MORNING AND 2 TAB (300mg ) EVERY EVENING  05/06/20  Yes Suzzanne Cloud, NP  Multiple Vitamins-Minerals (MULTIVITAMIN WITH MINERALS) tablet Take 1 tablet by mouth daily.   Yes [provider]  SYNTHROID 112 MCG tablet Take 112 mcg by mouth at bedtime.  03/02/20  Yes [provider]    Exam: Current vital signs: BP 131/83   Pulse 61   Temp 98 F (36.7 C) (Oral)   Resp 13   Ht 5\' 10"  (1.778 m)   Wt 76.2 kg   SpO2 95%   BMI 24.10 kg/m   Physical Exam  Constitutional: Appears well-developed and well-nourished.  Psych: Affect appropriate to situation Eyes: No scleral injection HENT: No OP obstrucion Head: Normocephalic.  Cardiovascular: Normal rate and regular rhythm.  Respiratory: Effort normal and breath sounds normal to anterior ascultation GI: Soft.  No distension. There is no tenderness.  Skin: WDI  Neuro: Mental Status: Patient is awake, alert, oriented to person, place, month, year, and situation. Patient is able to give a clear and coherent history No signs of aphasia or neglect Cranial Nerves: II: Visual Fields are full. Pupils are equal, round, and reactive to light. III,IV, VI: EOMI without ptosis or diploplia.  V: Facial sensation is symmetric to temperature VII: Facial movement is symmetric.  VIII: hearing is intact to voice X: Uvula elevates symmetrically XI: Shoulder shrug is symmetric. XII:  tongue is midline without atrophy or fasciculations.  Motor: Tone is normal. Bulk is normal. 5/5 strength was present in RUE/RLE. LUE/LLE 4/5.  Sensory: Sensation is greater on the right.  Plantars: Toes are upgoing.  Naming of objects is intact.     I have reviewed the images obtained: NCT head showed left frontal cranial vault s/p resection of tumor and no acute ischemic findings.  CTA head and neck showed no acute ischemic findings.   Assessment: Howard White is a 59 y.o. male PMHx of left frontal astrocytoma s/p craniotomy with resection 20 years ago. Seizures with none lately.  Presented with stroke sx of weakness and sensation changes to the left upper and lower extremity.   Pt passed bedside swallow exam and received 325mg  ASA po.   Impression: -LVO. Hx brain tumor with resection. Seizure hx without evidence of seizure activity now.  Acute ischemic stroke.   Plan: - MRI brain without contrast. - Recommend TTE. - Recommend labs: HbA1c, lipid panel, TSH. - Recommend Statin if LDL > 70 - Continue Aspirin. - SBP goal - Permissive hypertension first 24 h < 220/110. Hold home medications for now. - Telemetry monitoring for arrhythmia. - Recommend Stroke education. - Recommend PT/OT/SLP consult. - rEEG to eval for any epileptogenic discharges. - Recommend metabolic/infectious workup with CBC, CMP, UA with UCx, CXR, CK, serum lactate.   Patient seen with Dr. Delena Bali, NP Electronically signed by: Dr. Lynnae Sandhoff Pager: [phone] 09/26/2020, 4:57 PM

## 2020-09-26 NOTE — ED Provider Notes (Addendum)
[facility] EMERGENCY DEPARTMENT Provider Note   CSN: [id number] Arrival date & time: 09/26/20  1415  An emergency department physician performed an initial assessment on this suspected stroke patient at 1418.  History Chief Complaint  Patient presents with  . Code Stroke    Howard White is a 59 y.o. male presenting for evaluation of left-sided weakness.  Patient states around 1:00 this afternoon he fell down while reading and can get up due to leg weakness.  He reports since 1:00, he has had weakness of his left leg, left arm, and a left-sided facial droop.  He has a history of astrocytoma of the brain and seizures.  This is being managed by Dr. Krista Blue, recent MRI shows no growth.  Since his symptoms began, he reports no worsening, although no obvious improvement.  He denies recent fevers, chills, headache, chest pain, shortness breath, nausea, vomiting, abdominal pain, urinary symptoms, abnormal bowel movements.  No recent change in medications.  No recent trauma or injury.  He is not on blood thinners.  HPI     Past Medical History:  Diagnosis Date  . Brain cancer ([place])   . Seizures ([place])   . Thyroid disease    Hypothyroid    Patient Active Problem List   Diagnosis Date Noted  . Gait disorder 10/12/2016  . Memory loss 10/12/2016  . Epilepsy ([place]) 03/30/2013  . Malignant neoplasm of brain ([place]) 03/30/2013  . Astrocytoma brain tumor ([place]) 03/27/2012    Past Surgical History:  Procedure Laterality Date  . brain cancer    . BRAIN SURGERY         Family History  Problem Relation Age of Onset  . Brain cancer Unknown   . Thyroid disease Unknown   . Cancer Unknown   . Diabetes Unknown   . Stroke Unknown     Social History   Tobacco Use  . Smoking status: Never Smoker  . Smokeless tobacco: Never Used  Substance Use Topics  . Alcohol use: Yes    Alcohol/week: 0.0 standard drinks    Comment: 2 beers a month  . Drug use: No    Home  Medications Prior to Admission medications   Medication Sig Start Date End Date Taking? Authorizing Provider  acetaminophen (TYLENOL) 500 MG tablet Take 1,000 mg by mouth every 6 (six) hours as needed for mild pain.   Yes [provider]  escitalopram (LEXAPRO) 10 MG tablet Take 10 mg by mouth daily. 05/11/18  Yes [provider]  famotidine (PEPCID) 20 MG tablet Take 20 mg by mouth daily.   Yes [provider]  fluticasone (FLONASE) 50 MCG/ACT nasal spray Place 2 sprays into both nostrils daily as needed for allergies.  02/05/13  Yes [provider]  lamoTRIgine (LAMICTAL) 150 MG tablet TAKE 1 AND 1/2 TABS EVERY MORNING AND 2 TAB EVERY EVENING Patient taking differently: Take 225-300 mg by mouth See admin instructions. TAKE 1 AND 1/2 TABS ( 225mg )  EVERY MORNING AND 2 TAB (300mg ) EVERY EVENING 05/06/20  Yes Suzzanne Cloud, NP  Multiple Vitamins-Minerals (MULTIVITAMIN WITH MINERALS) tablet Take 1 tablet by mouth daily.   Yes [provider]  SYNTHROID 112 MCG tablet Take 112 mcg by mouth at bedtime.  03/02/20  Yes [provider]    Allergies    Lactose intolerance (gi)  Review of Systems   Review of Systems  Neurological: Positive for weakness.  All other systems reviewed and are negative.   Physical Exam  Updated Vital Signs BP 140/80   Pulse 68   Temp 98 F (36.7 C) (Oral)   Resp (!) 22   Ht 5\' 10"  (1.778 m)   Wt 76.2 kg   SpO2 96%   BMI 24.10 kg/m   Physical Exam Vitals and nursing note reviewed.  Constitutional:      General: He is not in acute distress.    Appearance: He is well-developed.     Comments: Appears nontoxic  HENT:     Head: Normocephalic and atraumatic.  Eyes:     Conjunctiva/sclera: Conjunctivae normal.     Pupils: Pupils are equal, round, and reactive to light.  Cardiovascular:     Rate and Rhythm: Normal rate and regular rhythm.     Pulses: Normal pulses.  Pulmonary:     Effort: Pulmonary effort  is normal. No respiratory distress.     Breath sounds: Normal breath sounds. No wheezing.  Abdominal:     General: There is no distension.     Palpations: Abdomen is soft. There is no mass.     Tenderness: There is no abdominal tenderness. There is no guarding or rebound.  Musculoskeletal:     Cervical back: Normal range of motion and neck supple.     Comments: Weakness of L arm and leg when compared to the R Radial and pedal pulses 2+ bilaterally  Skin:    General: Skin is warm and dry.     Capillary Refill: Capillary refill takes less than 2 seconds.  Neurological:     Mental Status: He is alert and oriented to person, place, and time.     GCS: GCS eye subscore is 4. GCS verbal subscore is 5. GCS motor subscore is 6.     Cranial Nerves: Facial asymmetry present.     Sensory: Sensation is intact.     Motor: Weakness present.     ED Results / Procedures / Treatments   Labs (all labs ordered are listed, but only abnormal results are displayed) Labs Reviewed  COMPREHENSIVE METABOLIC PANEL - Abnormal; Notable for the following components:      Result Value   Potassium 5.5 (*)    Glucose, Bld 101 (*)    Total Protein 6.4 (*)    All other components within normal limits  I-STAT CHEM 8, ED - Abnormal; Notable for the following components:   BUN 23 (*)    All other components within normal limits  RESPIRATORY PANEL BY RT PCR (FLU A&B, COVID)  PROTIME-INR  APTT  CBC  DIFFERENTIAL  CBG MONITORING, ED    EKG None  Radiology CT Angio Head W or Wo Contrast  Result Date: 09/26/2020 CLINICAL DATA:  Left-sided facial deficits. Left arm and leg weakness. History of brain tumor and seizures. EXAM: CT ANGIOGRAPHY HEAD AND NECK CT PERFUSION BRAIN TECHNIQUE: Multidetector CT imaging of the head and neck was performed using the standard protocol during bolus administration of intravenous contrast. Multiplanar CT image reconstructions and MIPs were obtained to evaluate the vascular  anatomy. Carotid stenosis measurements (when applicable) are obtained utilizing NASCET criteria, using the distal internal carotid diameter as the denominator. Multiphase CT imaging of the brain was performed following IV bolus contrast injection. Subsequent parametric perfusion maps were calculated using RAPID software. CONTRAST:  180mL OMNIPAQUE IOHEXOL 350 MG/ML SOLN COMPARISON:  None. FINDINGS: CTA NECK FINDINGS Aortic arch: Standard 3 vessel aortic arch with widely patent arch vessel origins. Right carotid system: Patent without evidence of stenosis, dissection, or significant atherosclerosis.  Left carotid system: Patent without evidence of stenosis, dissection, or significant atherosclerosis. Vertebral arteries: Patent without evidence of stenosis, dissection, or significant atherosclerosis. Mildly dominant right vertebral artery. Mildly limited assessment of the left vertebral artery origin due to motion artifact. Skeleton: Advanced disc degeneration at C4-5, C5-6, and C6-7. Moderate to severe neural foraminal stenosis on the left at C5-6 and on the right at C6-7 due to uncovertebral spurring. Other neck: No evidence of cervical lymphadenopathy or mass. Upper chest: Mild dependent atelectasis in the lung apices. Review of the MIP images confirms the above findings CTA HEAD FINDINGS Anterior circulation: The internal carotid arteries are patent from skull base to carotid termini with minimal nonstenotic plaque bilaterally. ACAs and MCAs are patent with mild branch vessel irregularity but no evidence of a proximal branch occlusion or flow limiting proximal stenosis. No aneurysm is identified. Posterior circulation: The intracranial vertebral arteries are widely patent to the basilar. Patent left PICA, right AICA, and bilateral SCA origins are identified. The basilar artery is widely patent. There is a fetal origin of the left PCA. Both PCAs are patent with branch vessel irregularity but no evidence of a flow  limiting proximal stenosis. There is a severe left P3 branch vessel stenosis. No aneurysm is identified. Venous sinuses: Patent. Anatomic variants: Fetal left PCA. Review of the MIP images confirms the above findings CT Brain Perfusion Findings: ASPECTS: 10 CBF (<30%) Volume: 0 mL Perfusion (Tmax>6.0s) volume: 0 mL Mismatch Volume: 0 mL Infarction Location: N/a IMPRESSION: 1. Intracranial atherosclerosis without large vessel occlusion or flow limiting proximal stenosis. 2. Widely patent cervical carotid and vertebral arteries allowing for motion through the left vertebral origin. 3. No evidence of acute ischemia on CTP. These results were communicated to Dr. Theda Sers at 2:49 pm on 09/26/2020 by text page via the [facility] messaging system. Electronically Signed   By: Logan Bores M.D.   On: 09/26/2020 14:56   CT Angio Neck W and/or Wo Contrast  Result Date: 09/26/2020 CLINICAL DATA:  Left-sided facial deficits. Left arm and leg weakness. History of brain tumor and seizures. EXAM: CT ANGIOGRAPHY HEAD AND NECK CT PERFUSION BRAIN TECHNIQUE: Multidetector CT imaging of the head and neck was performed using the standard protocol during bolus administration of intravenous contrast. Multiplanar CT image reconstructions and MIPs were obtained to evaluate the vascular anatomy. Carotid stenosis measurements (when applicable) are obtained utilizing NASCET criteria, using the distal internal carotid diameter as the denominator. Multiphase CT imaging of the brain was performed following IV bolus contrast injection. Subsequent parametric perfusion maps were calculated using RAPID software. CONTRAST:  180mL OMNIPAQUE IOHEXOL 350 MG/ML SOLN COMPARISON:  None. FINDINGS: CTA NECK FINDINGS Aortic arch: Standard 3 vessel aortic arch with widely patent arch vessel origins. Right carotid system: Patent without evidence of stenosis, dissection, or significant atherosclerosis. Left carotid system: Patent without evidence of stenosis,  dissection, or significant atherosclerosis. Vertebral arteries: Patent without evidence of stenosis, dissection, or significant atherosclerosis. Mildly dominant right vertebral artery. Mildly limited assessment of the left vertebral artery origin due to motion artifact. Skeleton: Advanced disc degeneration at C4-5, C5-6, and C6-7. Moderate to severe neural foraminal stenosis on the left at C5-6 and on the right at C6-7 due to uncovertebral spurring. Other neck: No evidence of cervical lymphadenopathy or mass. Upper chest: Mild dependent atelectasis in the lung apices. Review of the MIP images confirms the above findings CTA HEAD FINDINGS Anterior circulation: The internal carotid arteries are patent from skull base to carotid termini with minimal nonstenotic  plaque bilaterally. ACAs and MCAs are patent with mild branch vessel irregularity but no evidence of a proximal branch occlusion or flow limiting proximal stenosis. No aneurysm is identified. Posterior circulation: The intracranial vertebral arteries are widely patent to the basilar. Patent left PICA, right AICA, and bilateral SCA origins are identified. The basilar artery is widely patent. There is a fetal origin of the left PCA. Both PCAs are patent with branch vessel irregularity but no evidence of a flow limiting proximal stenosis. There is a severe left P3 branch vessel stenosis. No aneurysm is identified. Venous sinuses: Patent. Anatomic variants: Fetal left PCA. Review of the MIP images confirms the above findings CT Brain Perfusion Findings: ASPECTS: 10 CBF (<30%) Volume: 0 mL Perfusion (Tmax>6.0s) volume: 0 mL Mismatch Volume: 0 mL Infarction Location: N/a IMPRESSION: 1. Intracranial atherosclerosis without large vessel occlusion or flow limiting proximal stenosis. 2. Widely patent cervical carotid and vertebral arteries allowing for motion through the left vertebral origin. 3. No evidence of acute ischemia on CTP. These results were communicated to Dr.  Theda Sers at 2:49 pm on 09/26/2020 by text page via the [facility] messaging system. Electronically Signed   By: Logan Bores M.D.   On: 09/26/2020 14:56   CT CEREBRAL PERFUSION W CONTRAST  Result Date: 09/26/2020 CLINICAL DATA:  Left-sided facial deficits. Left arm and leg weakness. History of brain tumor and seizures. EXAM: CT ANGIOGRAPHY HEAD AND NECK CT PERFUSION BRAIN TECHNIQUE: Multidetector CT imaging of the head and neck was performed using the standard protocol during bolus administration of intravenous contrast. Multiplanar CT image reconstructions and MIPs were obtained to evaluate the vascular anatomy. Carotid stenosis measurements (when applicable) are obtained utilizing NASCET criteria, using the distal internal carotid diameter as the denominator. Multiphase CT imaging of the brain was performed following IV bolus contrast injection. Subsequent parametric perfusion maps were calculated using RAPID software. CONTRAST:  160mL OMNIPAQUE IOHEXOL 350 MG/ML SOLN COMPARISON:  None. FINDINGS: CTA NECK FINDINGS Aortic arch: Standard 3 vessel aortic arch with widely patent arch vessel origins. Right carotid system: Patent without evidence of stenosis, dissection, or significant atherosclerosis. Left carotid system: Patent without evidence of stenosis, dissection, or significant atherosclerosis. Vertebral arteries: Patent without evidence of stenosis, dissection, or significant atherosclerosis. Mildly dominant right vertebral artery. Mildly limited assessment of the left vertebral artery origin due to motion artifact. Skeleton: Advanced disc degeneration at C4-5, C5-6, and C6-7. Moderate to severe neural foraminal stenosis on the left at C5-6 and on the right at C6-7 due to uncovertebral spurring. Other neck: No evidence of cervical lymphadenopathy or mass. Upper chest: Mild dependent atelectasis in the lung apices. Review of the MIP images confirms the above findings CTA HEAD FINDINGS Anterior circulation: The  internal carotid arteries are patent from skull base to carotid termini with minimal nonstenotic plaque bilaterally. ACAs and MCAs are patent with mild branch vessel irregularity but no evidence of a proximal branch occlusion or flow limiting proximal stenosis. No aneurysm is identified. Posterior circulation: The intracranial vertebral arteries are widely patent to the basilar. Patent left PICA, right AICA, and bilateral SCA origins are identified. The basilar artery is widely patent. There is a fetal origin of the left PCA. Both PCAs are patent with branch vessel irregularity but no evidence of a flow limiting proximal stenosis. There is a severe left P3 branch vessel stenosis. No aneurysm is identified. Venous sinuses: Patent. Anatomic variants: Fetal left PCA. Review of the MIP images confirms the above findings CT Brain Perfusion Findings: ASPECTS: 10  CBF (<30%) Volume: 0 mL Perfusion (Tmax>6.0s) volume: 0 mL Mismatch Volume: 0 mL Infarction Location: N/a IMPRESSION: 1. Intracranial atherosclerosis without large vessel occlusion or flow limiting proximal stenosis. 2. Widely patent cervical carotid and vertebral arteries allowing for motion through the left vertebral origin. 3. No evidence of acute ischemia on CTP. These results were communicated to Dr. Theda Sers at 2:49 pm on 09/26/2020 by text page via the [facility] messaging system. Electronically Signed   By: Logan Bores M.D.   On: 09/26/2020 14:56   CT HEAD CODE STROKE WO CONTRAST  Result Date: 09/26/2020 CLINICAL DATA:  Code stroke. Left-sided facial deficits. Left arm and leg weakness. Personal history of brain tumor and seizures. EXAM: CT HEAD WITHOUT CONTRAST TECHNIQUE: Contiguous axial images were obtained from the base of the skull through the vertex without intravenous contrast. COMPARISON:  MR head without and with contrast 06/23/2020 and 04/21/2018. FINDINGS: Brain: Left frontal craniotomy again noted. Surgical cavity evident. Diffuse white matter  disease is again seen. Remote lacunar infarct is present in the right basal ganglia. No significant changes present. Insular ribbon is normal bilaterally. Cortex is unremarkable. Brainstem and cerebellum are with in normal limits. Vascular: No hyperdense vessel or unexpected calcification. Skull: Left frontal craniotomy noted. Calvarium is otherwise intact. No focal lytic or blastic lesions are present. No significant extracranial soft tissue lesion is present. Sinuses/Orbits: The paranasal sinuses and mastoid air cells are clear. The globes and orbits are within normal limits. ASPECTS [facility] Stroke Program Early CT Score) - Ganglionic level infarction (caudate, lentiform nuclei, internal capsule, insula, M1-M3 cortex): 7/7 - Supraganglionic infarction (M4-M6 cortex): 3/3 Total score (0-10 with 10 being normal): 10/10 IMPRESSION: 1. No acute intracranial abnormality or significant interval change. 2. Stable atrophy and white matter disease. 3. Left frontal craniotomy with resection cavity. 4. Aspects 10/10 5. The above was relayed via text pager to Dr. Theda Sers on 09/26/2020 at 14:32 . Electronically Signed   By: San Morelle M.D.   On: 09/26/2020 14:33    Procedures Procedures (including critical care time)  Medications Ordered in ED Medications  sodium chloride flush (NS) 0.9 % injection 3 mL (3 mLs Intravenous Given 09/26/20 1451)  iohexol (OMNIPAQUE) 350 MG/ML injection 100 mL (100 mLs Intravenous Contrast Given 09/26/20 1440)  aspirin tablet 325 mg (325 mg Oral Given 09/26/20 1448)    ED Course  I have reviewed the triage vital signs and the nursing notes.  Pertinent labs & imaging results that were available during my care of the patient were reviewed by me and considered in my medical decision making (see chart for details).    MDM Rules/Calculators/A&P                          Patient presenting for evaluation of acute onset left-sided weakness.  Concerning for stroke.  Code  stroke was called by EMS, Dr Theda Sers with neuro evaluated the patient.  On my evaluation, patient with left-sided weakness and facial droop.  CT head negative for bleed.  CTA and CT perfusion negative for LVO.  Patient will need MRI.  Labs obtained from triage interpreted by me, overall reassuring.  No electrolyte abnormality.  Discussed with Dr.Arrien from triad hospitalist service, pt to be admitted.   Final Clinical Impression(s) / ED Diagnoses Final diagnoses:  Left-sided weakness    Rx / DC Orders ED Discharge Orders    None         Franchot Heidelberg, PA-C 09/26/20  Mahnomen Robert, MD 09/29/20 2347

## 2020-09-27 ENCOUNTER — Inpatient Hospital Stay (HOSPITAL_COMMUNITY): Payer: PPO

## 2020-09-27 DIAGNOSIS — I6389 Other cerebral infarction: Secondary | ICD-10-CM | POA: Diagnosis not present

## 2020-09-27 LAB — CBC
HCT: 47 % (ref 39.0–52.0)
Hemoglobin: 16.2 g/dL (ref 13.0–17.0)
MCH: 31.6 pg (ref 26.0–34.0)
MCHC: 34.5 g/dL (ref 30.0–36.0)
MCV: 91.8 fL (ref 80.0–100.0)
Platelets: 268 10*3/uL (ref 150–400)
RBC: 5.12 MIL/uL (ref 4.22–5.81)
RDW: 11.9 % (ref 11.5–15.5)
WBC: 7.5 10*3/uL (ref 4.0–10.5)
nRBC: 0 % (ref 0.0–0.2)

## 2020-09-27 LAB — LIPID PANEL
Cholesterol: 199 mg/dL (ref 0–200)
HDL: 42 mg/dL
LDL Cholesterol: 131 mg/dL — ABNORMAL HIGH (ref 0–99)
Total CHOL/HDL Ratio: 4.7 ratio
Triglycerides: 130 mg/dL
VLDL: 26 mg/dL (ref 0–40)

## 2020-09-27 LAB — BASIC METABOLIC PANEL
Anion gap: 8 (ref 5–15)
BUN: 15 mg/dL (ref 6–20)
CO2: 25 mmol/L (ref 22–32)
Calcium: 9.5 mg/dL (ref 8.9–10.3)
Chloride: 103 mmol/L (ref 98–111)
Creatinine, Ser: 1 mg/dL (ref 0.61–1.24)
GFR, Estimated: >60 mL/min (ref >60)
Glucose, Bld: 97 mg/dL (ref 70–99)
Potassium: 3.8 mmol/L (ref 3.5–5.1)
Sodium: 136 mmol/L (ref 135–145)

## 2020-09-27 LAB — ECHOCARDIOGRAM COMPLETE
Area-P 1/2: 2.95 cm2
Height: 70 in
S' Lateral: 3.1 cm
Weight: 3054.69 oz

## 2020-09-27 LAB — HEMOGLOBIN A1C
Hgb A1c MFr Bld: 5.4 % (ref 4.8–5.6)
Mean Plasma Glucose: 108.28 mg/dL

## 2020-09-27 MED ORDER — DOCUSATE SODIUM 100 MG PO CAPS
100.0000 mg | ORAL_CAPSULE | Freq: Two times a day (BID) | ORAL | Status: DC
  Administered 2020-09-27 – 2020-09-30 (×6): 100 mg via ORAL
  Filled 2020-09-27 (×6): qty 1

## 2020-09-27 NOTE — Evaluation (Signed)
Speech Language Pathology Evaluation Patient Details Name: Howard White MRN: [id number] DOB: 05/21/1961 Today's Date: 09/27/2020 Time: 0915-0950 SLP Time Calculation (min) (ACUTE ONLY): 35 min  Problem List:  Patient Active Problem List   Diagnosis Date Noted  . CVA (cerebral vascular accident) ([place]) 09/26/2020  . Gait disorder 10/12/2016  . Memory loss 10/12/2016  . Epilepsy ([place]) 03/30/2013  . Malignant neoplasm of brain ([place]) 03/30/2013  . Astrocytoma brain tumor ([place]) 03/27/2012   Past Medical History:  Past Medical History:  Diagnosis Date  . Brain cancer ([place])   . Seizures ([place])   . Thyroid disease    Hypothyroid   Past Surgical History:  Past Surgical History:  Procedure Laterality Date  . brain cancer    . BRAIN SURGERY     HPI:  Howard White is a 59 y.o. male with medical history significant of brain cancer, seizures and hypothyroidism.  MRI was remarkable for an acute infarct extending from the right corona radiata into the right basal ganglia.   Assessment / Plan / Recommendation Clinical Impression  Patient was seen for a cognitive-linguistic evaluation in the setting an acute right corona radiata and right basal ganglia infarct.  Patient was awake/alert and agreeable to this evaluation, and his mother was present.  They reported that the patient has baseline short-term memory deficits given hx of brain cancer and that he resides with his parents.  Mother reported that she and her husband assist the patient with medication management at baseline, and that they are available 24/7 to assist the patient at time of discharge.  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  He completed the [place] in addition to informal evaluation measures.  He scored overall 17/30 on the SLUMS, indicating moderate cognitive deficits.  He was observed to have deficits in the areas of short-term memory, problem solving, and  executive functioning.  Clock drawing task and numeric problem solving questions were the most challenging for the patient.  Expressive and receptive language appeared to be functional.  Minimal dysarthria was noted secondary to lower left facial numbness and slight left labial asymmetry.  Recommend inpatient rehab (CIR) targeting cognitive-linguistic deficits at time of discharge.  Additionally recommend assistance and supervision with IADLs including medication and financial management when patient returns home.  Spoke with the patient and his mother regarding recommendations and they verbalized understanding.  SLP will f/u per POC.      SLP Assessment  SLP Recommendation/Assessment: Patient needs continued Speech Lanaguage Pathology Services SLP Visit Diagnosis: Cognitive communication deficit (R41.841)    Follow Up Recommendations  Inpatient Rehab    Frequency and Duration min 2x/week  2 weeks      SLP Evaluation Cognition  Overall Cognitive Status: Impaired/Different from baseline Arousal/Alertness: Awake/alert Orientation Level: Oriented X4 Attention: Focused;Sustained Focused Attention: Appears intact Sustained Attention: Appears intact Memory: Impaired Memory Impairment: Decreased short term memory Decreased Short Term Memory: Verbal basic Awareness: Appears intact Problem Solving: Impaired Problem Solving Impairment: Verbal basic;Verbal complex;Functional basic;Functional complex Executive Function: Reasoning;Organizing Reasoning: Impaired Reasoning Impairment: Verbal complex Organizing: Impaired Organizing Impairment: Verbal complex Safety/Judgment: Appears intact       Comprehension  Auditory Comprehension Overall Auditory Comprehension: Appears within functional limits for tasks assessed Yes/No Questions: Within Functional Limits Conversation: Complex Reading Comprehension Reading Status: Not tested    Expression Expression Primary Mode of Expression:  Verbal Verbal Expression Overall Verbal Expression: Appears within functional limits for tasks assessed  Initiation: No impairment Level of Generative/Spontaneous Verbalization: Conversation Repetition: No impairment Naming: No impairment Pragmatics: No impairment Written Expression Dominant Hand: Right Written Expression: Not tested   Oral / Motor  Oral Motor/Sensory Function Overall Oral Motor/Sensory Function: Mild impairment Facial ROM: Reduced left Facial Symmetry: Abnormal symmetry left Facial Strength: Within Functional Limits Facial Sensation: Reduced left Lingual ROM: Within Functional Limits Lingual Symmetry: Within Functional Limits Lingual Strength: Within Functional Limits Motor Speech Overall Motor Speech: Impaired Phonation: Normal Resonance: Within functional limits Articulation: Impaired Level of Impairment: Conversation Intelligibility: Intelligibility reduced Word: 75-100% accurate Phrase: 75-100% accurate Sentence: 75-100% accurate Conversation: 75-100% accurate Motor Planning: Witnin functional limits Motor Speech Errors: Not applicable   GO                   Colin Mulders M.S., [facility] Office: [phone]  [place] 09/27/2020, 9:58 AM

## 2020-09-27 NOTE — Progress Notes (Signed)
PROGRESS NOTE    Yavuz Kirby  DGL:[id number] DOB: 1961/07/16 DOA: 09/26/2020 PCP: Seward Carol, MD   Brief Narrative:  Estes Lehner is a 59 y.o. male with medical history significant of brain cancer, seizures and hypothyroidism.  Patient has been at his usual state of health until around 1:30 PM on 09/26/2020 when he noticed left sided weakness and left facial droop. His left-sided weakness is mainly affecting his left upper extremity and left face.  He has been seizure-free for about 5 years, a recent neurologic follow-up 4 months ago was normal. When he was diagnosed with his brain cancer he was symptomatic on the right side of his body.   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  Acute CVA: MRI brain shows 1.9 x 1.2 cm acute infarct extending from the right coronary radiator into the right basal ganglia.  Extensive T2 hyperintense signal abnormality throughout the cerebral white matter.  CT cerebral perfusion and CT angio of head and neck showed intracranial atherosclerosis without LVO.  Echo pending.  Patient is still with left-sided upper extremity weakness.  Continue aspirin and Plavix.  PT OT recommended CIR.  2.  Epilepsy, seems to be well controlled.  Continue with antiseizure medications, lamotrigine.   3.  History of brain astrocytoma.  No signs of recurrence.  4. Depression. Continue with escitalopram.   5. Hypothyroid. Continue levothyroxine.   6.  Hyperlipidemia: LDL 131.  Continue atorvastatin 40 mg.  DVT prophylaxis: enoxaparin (LOVENOX) injection 40 mg Start: 09/26/20 2200 SCDs Start: 09/26/20 1539   Code Status: Full Code  Family Communication: Mother present at bedside.  Plan of care discussed with patient in length and he verbalized understanding and agreed with  it.  Status is: Inpatient  Remains inpatient appropriate because:Inpatient level of care appropriate due to severity of illness   Dispo: The patient is from: Home              Anticipated d/c is to: CIR              Anticipated d/c date is: 1 day              Patient currently is medically stable to d/c.        Estimated body mass index is 27.39 kg/m as calculated from the following:   Height as of this encounter: 5\' 10"  (1.778 m).   Weight as of this encounter: 86.6 kg.      Nutritional status:               Consultants:   Neurology  Procedures:   None  Antimicrobials:  Anti-infectives (From admission, onward)   None         Subjective: Seen and examined.  Patient's mother at the bedside.  Patient states that his left-sided weakness is improving but left-sided weakness is improving but not back to baseline.  He has no other complaint.   Objective: Vitals:   09/26/20 1931 09/27/20 0100 09/27/20 0300 09/27/20 0500  BP: (!) 138/91 (!) 148/82 (!) 143/91 110/71  Pulse:  67 63 65  Resp: 18 18 18 18   Temp: 97.7 F (36.5 C) 98 F (36.7 C) 98 F (36.7 C) 98.2 F (36.8 C)  TempSrc: Oral Oral Oral Oral  SpO2: 96% 95% 95% 97%  Weight: 86.6 kg     Height: 5\' 10"  (1.778 m)       Intake/Output Summary (Last 24 hours) at 09/27/2020 1201 Last data filed at 09/26/2020 2152 Gross per 24 hour  Intake --  Output 650 ml  Net -650 ml   Filed Weights   09/26/20 1453 09/26/20 1931  Weight: 76.2 kg 86.6 kg    Examination:  General exam: Appears calm and comfortable  Respiratory system: Clear to auscultation. Respiratory effort normal. Cardiovascular system: S1 & S2 heard, RRR. No JVD, murmurs, rubs, gallops or clicks. No pedal edema. Gastrointestinal system: Abdomen is nondistended, soft and nontender. No organomegaly or masses felt. Normal bowel sounds heard. Central nervous system: Alert and oriented.  Left facial droop with slight dysarthria.  4/5  power in left upper extremity, 4.5/5 power in left lower extremity.  Normal power in right upper and lower extremity.  Deficits. Skin: No rashes, lesions or ulcers Psychiatry: Judgement and insight appear normal. Mood & affect appropriate.    Data Reviewed: I have personally reviewed following labs and imaging studies  CBC: Recent Labs  Lab 09/26/20 1419 09/26/20 1432 09/26/20 1748 09/27/20 0501  WBC 6.8  --  7.4 7.5  NEUTROABS 3.6  --   --   --   HGB 15.5 16.0 15.2 16.2  HCT 46.4 47.0 46.2 47.0  MCV 94.3  --  94.3 91.8  PLT 230  --  256 546   Basic Metabolic Panel: Recent Labs  Lab 09/26/20 1419 09/26/20 1432 09/26/20 1748 09/27/20 0501  NA 136 137  --  136  K 5.5* 4.9  --  3.8  CL 101 101  --  103  CO2 27  --   --  25  GLUCOSE 101* 97  --  97  BUN 17 23*  --  15  CREATININE 1.10 1.00 1.03 1.00  CALCIUM 9.4  --   --  9.5   GFR: Estimated Creatinine Clearance: 83.1 mL/min (by C-G formula based on SCr of 1 mg/dL). Liver Function Tests: Recent Labs  Lab 09/26/20 1419  AST 39  ALT 22  ALKPHOS 44  BILITOT 1.0  PROT 6.4*  ALBUMIN 4.2   No results for input(s): LIPASE, AMYLASE in the last 168 hours. No results for input(s): AMMONIA in the last 168 hours. Coagulation Profile: Recent Labs  Lab 09/26/20 1419  INR 1.0   Cardiac Enzymes: No results for input(s): CKTOTAL, CKMB, CKMBINDEX, TROPONINI in the last 168 hours. BNP (last 3 results) No results for input(s): PROBNP in the last 8760 hours. HbA1C: Recent Labs    09/27/20 0501  HGBA1C 5.4   CBG: Recent Labs  Lab 09/26/20 1418  GLUCAP 95   Lipid Profile: Recent Labs    09/27/20 0501  CHOL 199  HDL 42  LDLCALC 131*  TRIG 130  CHOLHDL 4.7   Thyroid Function Tests: No results for input(s): TSH, T4TOTAL, FREET4, T3FREE, THYROIDAB in the last 72 hours. Anemia Panel: No results for input(s): VITAMINB12, FOLATE, FERRITIN, TIBC, IRON, RETICCTPCT in the last 72 hours. Sepsis Labs: No results for  input(s): PROCALCITON, LATICACIDVEN in the last 168 hours.  Recent Results (from the past 240 hour(s))  Respiratory Panel by RT PCR (Flu A&B, Covid) - Nasopharyngeal Swab     Status: None   Collection Time: 09/26/20  3:33 PM   Specimen: Nasopharyngeal Swab  Result Value Ref Range Status   SARS Coronavirus 2 by RT PCR NEGATIVE NEGATIVE Final    Comment: (NOTE) SARS-CoV-2  target nucleic acids are NOT DETECTED.  The SARS-CoV-2 RNA is generally detectable in upper respiratoy specimens during the acute phase of infection. The lowest concentration of SARS-CoV-2 viral copies this assay can detect is 131 copies/mL. A negative result does not preclude SARS-Cov-2 infection and should not be used as the sole basis for treatment or other patient management decisions. A negative result may occur with  improper specimen collection/handling, submission of specimen other than nasopharyngeal swab, presence of viral mutation(s) within the areas targeted by this assay, and inadequate number of viral copies (<131 copies/mL). A negative result must be combined with clinical observations, patient history, and epidemiological information. The expected result is Negative.  Fact Sheet for Patients:  PinkCheek.be  Fact Sheet for Healthcare Providers:  GravelBags.it  This test is no t yet approved or cleared by the [place] FDA and  has been authorized for detection and/or diagnosis of SARS-CoV-2 by FDA under an Emergency Use Authorization (EUA). This EUA will remain  in effect (meaning this test can be used) for the duration of the COVID-19 declaration under Section 564(b)(1) of the Act, 21 U.S.C. section 360bbb-3(b)(1), unless the authorization is terminated or revoked sooner.     Influenza A by PCR NEGATIVE NEGATIVE Final   Influenza B by PCR NEGATIVE NEGATIVE Final    Comment: (NOTE) The Xpert Xpress SARS-CoV-2/FLU/RSV assay is intended  as an aid in  the diagnosis of influenza from Nasopharyngeal swab specimens and  should not be used as a sole basis for treatment. Nasal washings and  aspirates are unacceptable for Xpert Xpress SARS-CoV-2/FLU/RSV  testing.  Fact Sheet for Patients: PinkCheek.be  Fact Sheet for Healthcare Providers: GravelBags.it  This test is not yet approved or cleared by the [place] FDA and  has been authorized for detection and/or diagnosis of SARS-CoV-2 by  FDA under an Emergency Use Authorization (EUA). This EUA will remain  in effect (meaning this test can be used) for the duration of the  Covid-19 declaration under Section 564(b)(1) of the Act, 21  U.S.C. section 360bbb-3(b)(1), unless the authorization is  terminated or revoked. Performed at [facility], [place] [address] [place], [place] [address]       Radiology Studies: CT Angio Head W or Wo Contrast  Result Date: 09/26/2020 CLINICAL DATA:  Left-sided facial deficits. Left arm and leg weakness. History of brain tumor and seizures. EXAM: CT ANGIOGRAPHY HEAD AND NECK CT PERFUSION BRAIN TECHNIQUE: Multidetector CT imaging of the head and neck was performed using the standard protocol during bolus administration of intravenous contrast. Multiplanar CT image reconstructions and MIPs were obtained to evaluate the vascular anatomy. Carotid stenosis measurements (when applicable) are obtained utilizing NASCET criteria, using the distal internal carotid diameter as the denominator. Multiphase CT imaging of the brain was performed following IV bolus contrast injection. Subsequent parametric perfusion maps were calculated using RAPID software. CONTRAST:  14mL OMNIPAQUE IOHEXOL 350 MG/ML SOLN COMPARISON:  None. FINDINGS: CTA NECK FINDINGS Aortic arch: Standard 3 vessel aortic arch with widely patent arch vessel origins. Right carotid system: Patent without evidence of stenosis,  dissection, or significant atherosclerosis. Left carotid system: Patent without evidence of stenosis, dissection, or significant atherosclerosis. Vertebral arteries: Patent without evidence of stenosis, dissection, or significant atherosclerosis. Mildly dominant right vertebral artery. Mildly limited assessment of the left vertebral artery origin due to motion artifact. Skeleton: Advanced disc degeneration at C4-5, C5-6, and C6-7. Moderate to severe neural foraminal stenosis on the left at C5-6 and on the right  at C6-7 due to uncovertebral spurring. Other neck: No evidence of cervical lymphadenopathy or mass. Upper chest: Mild dependent atelectasis in the lung apices. Review of the MIP images confirms the above findings CTA HEAD FINDINGS Anterior circulation: The internal carotid arteries are patent from skull base to carotid termini with minimal nonstenotic plaque bilaterally. ACAs and MCAs are patent with mild branch vessel irregularity but no evidence of a proximal branch occlusion or flow limiting proximal stenosis. No aneurysm is identified. Posterior circulation: The intracranial vertebral arteries are widely patent to the basilar. Patent left PICA, right AICA, and bilateral SCA origins are identified. The basilar artery is widely patent. There is a fetal origin of the left PCA. Both PCAs are patent with branch vessel irregularity but no evidence of a flow limiting proximal stenosis. There is a severe left P3 branch vessel stenosis. No aneurysm is identified. Venous sinuses: Patent. Anatomic variants: Fetal left PCA. Review of the MIP images confirms the above findings CT Brain Perfusion Findings: ASPECTS: 10 CBF (<30%) Volume: 0 mL Perfusion (Tmax>6.0s) volume: 0 mL Mismatch Volume: 0 mL Infarction Location: N/a IMPRESSION: 1. Intracranial atherosclerosis without large vessel occlusion or flow limiting proximal stenosis. 2. Widely patent cervical carotid and vertebral arteries allowing for motion through the  left vertebral origin. 3. No evidence of acute ischemia on CTP. These results were communicated to Dr. Theda Sers at 2:49 pm on 09/26/2020 by text page via the [facility] messaging system. Electronically Signed   By: Logan Bores M.D.   On: 09/26/2020 14:56   CT Angio Neck W and/or Wo Contrast  Result Date: 09/26/2020 CLINICAL DATA:  Left-sided facial deficits. Left arm and leg weakness. History of brain tumor and seizures. EXAM: CT ANGIOGRAPHY HEAD AND NECK CT PERFUSION BRAIN TECHNIQUE: Multidetector CT imaging of the head and neck was performed using the standard protocol during bolus administration of intravenous contrast. Multiplanar CT image reconstructions and MIPs were obtained to evaluate the vascular anatomy. Carotid stenosis measurements (when applicable) are obtained utilizing NASCET criteria, using the distal internal carotid diameter as the denominator. Multiphase CT imaging of the brain was performed following IV bolus contrast injection. Subsequent parametric perfusion maps were calculated using RAPID software. CONTRAST:  160mL OMNIPAQUE IOHEXOL 350 MG/ML SOLN COMPARISON:  None. FINDINGS: CTA NECK FINDINGS Aortic arch: Standard 3 vessel aortic arch with widely patent arch vessel origins. Right carotid system: Patent without evidence of stenosis, dissection, or significant atherosclerosis. Left carotid system: Patent without evidence of stenosis, dissection, or significant atherosclerosis. Vertebral arteries: Patent without evidence of stenosis, dissection, or significant atherosclerosis. Mildly dominant right vertebral artery. Mildly limited assessment of the left vertebral artery origin due to motion artifact. Skeleton: Advanced disc degeneration at C4-5, C5-6, and C6-7. Moderate to severe neural foraminal stenosis on the left at C5-6 and on the right at C6-7 due to uncovertebral spurring. Other neck: No evidence of cervical lymphadenopathy or mass. Upper chest: Mild dependent atelectasis in the lung  apices. Review of the MIP images confirms the above findings CTA HEAD FINDINGS Anterior circulation: The internal carotid arteries are patent from skull base to carotid termini with minimal nonstenotic plaque bilaterally. ACAs and MCAs are patent with mild branch vessel irregularity but no evidence of a proximal branch occlusion or flow limiting proximal stenosis. No aneurysm is identified. Posterior circulation: The intracranial vertebral arteries are widely patent to the basilar. Patent left PICA, right AICA, and bilateral SCA origins are identified. The basilar artery is widely patent. There is a fetal origin of the  left PCA. Both PCAs are patent with branch vessel irregularity but no evidence of a flow limiting proximal stenosis. There is a severe left P3 branch vessel stenosis. No aneurysm is identified. Venous sinuses: Patent. Anatomic variants: Fetal left PCA. Review of the MIP images confirms the above findings CT Brain Perfusion Findings: ASPECTS: 10 CBF (<30%) Volume: 0 mL Perfusion (Tmax>6.0s) volume: 0 mL Mismatch Volume: 0 mL Infarction Location: N/a IMPRESSION: 1. Intracranial atherosclerosis without large vessel occlusion or flow limiting proximal stenosis. 2. Widely patent cervical carotid and vertebral arteries allowing for motion through the left vertebral origin. 3. No evidence of acute ischemia on CTP. These results were communicated to Dr. Theda Sers at 2:49 pm on 09/26/2020 by text page via the [facility] messaging system. Electronically Signed   By: Logan Bores M.D.   On: 09/26/2020 14:56   MR BRAIN WO CONTRAST  Result Date: 09/26/2020 CLINICAL DATA:  Neuro deficit, acute, stroke suspected. Left-sided weakness. EXAM: MRI HEAD WITHOUT CONTRAST TECHNIQUE: Multiplanar, multiecho pulse sequences of the brain and surrounding structures were obtained without intravenous contrast. COMPARISON:  Noncontrast head CT, CT angiogram head/neck and CT perfusion performed earlier the same day 09/26/2020. Brain  MRI 06/23/2020. FINDINGS: Brain: 1.9 x 0.5 x 1.2 cm (AP x TV x CC) focus of restricted diffusion extending from the right corona radiata inferiorly into the right basal ganglia, consistent with acute infarction. Large resection cavity within the left frontal lobe with a small amount of chronic blood products at the periphery. Associated ex vacuo dilatation of the left lateral ventricle. Redemonstrated extensive confluent T2/FLAIR hyperintense signal abnormality throughout the bilateral cerebral white matter. Chronic small vessel ischemic changes within the deep gray nuclei. No non-contrast evidence of intracranial mass. No extra-axial fluid collection. No midline shift. Vascular: Expected proximal arterial flow voids. Skull and upper cervical spine: Left frontoparietal cranioplasty. No focal marrow lesion. Sinuses/Orbits: Visualized orbits show no acute finding. No significant paranasal sinus disease. Trace fluid within the left mastoid air cells. IMPRESSION: 1.9 x 1.2 cm acute infarct extending from the right corona radiata into the right basal ganglia. Chronic findings which are stable as compared to the MRI of 06/23/2020, as follows. Prior left frontoparietal craniotomy with left frontal lobe resection cavity. Extensive T2 hyperintense signal abnormality throughout the cerebral white matter, nonspecific but likely reflecting a combination of post-treatment changes and chronic small vessel ischemic disease. Chronic small vessel ischemic changes are also present within the deep gray nuclei. Electronically Signed   By: Kellie Simmering DO   On: 09/26/2020 18:05   CT CEREBRAL PERFUSION W CONTRAST  Result Date: 09/26/2020 CLINICAL DATA:  Left-sided facial deficits. Left arm and leg weakness. History of brain tumor and seizures. EXAM: CT ANGIOGRAPHY HEAD AND NECK CT PERFUSION BRAIN TECHNIQUE: Multidetector CT imaging of the head and neck was performed using the standard protocol during bolus administration of  intravenous contrast. Multiplanar CT image reconstructions and MIPs were obtained to evaluate the vascular anatomy. Carotid stenosis measurements (when applicable) are obtained utilizing NASCET criteria, using the distal internal carotid diameter as the denominator. Multiphase CT imaging of the brain was performed following IV bolus contrast injection. Subsequent parametric perfusion maps were calculated using RAPID software. CONTRAST:  113mL OMNIPAQUE IOHEXOL 350 MG/ML SOLN COMPARISON:  None. FINDINGS: CTA NECK FINDINGS Aortic arch: Standard 3 vessel aortic arch with widely patent arch vessel origins. Right carotid system: Patent without evidence of stenosis, dissection, or significant atherosclerosis. Left carotid system: Patent without evidence of stenosis, dissection, or significant atherosclerosis. Vertebral  arteries: Patent without evidence of stenosis, dissection, or significant atherosclerosis. Mildly dominant right vertebral artery. Mildly limited assessment of the left vertebral artery origin due to motion artifact. Skeleton: Advanced disc degeneration at C4-5, C5-6, and C6-7. Moderate to severe neural foraminal stenosis on the left at C5-6 and on the right at C6-7 due to uncovertebral spurring. Other neck: No evidence of cervical lymphadenopathy or mass. Upper chest: Mild dependent atelectasis in the lung apices. Review of the MIP images confirms the above findings CTA HEAD FINDINGS Anterior circulation: The internal carotid arteries are patent from skull base to carotid termini with minimal nonstenotic plaque bilaterally. ACAs and MCAs are patent with mild branch vessel irregularity but no evidence of a proximal branch occlusion or flow limiting proximal stenosis. No aneurysm is identified. Posterior circulation: The intracranial vertebral arteries are widely patent to the basilar. Patent left PICA, right AICA, and bilateral SCA origins are identified. The basilar artery is widely patent. There is a  fetal origin of the left PCA. Both PCAs are patent with branch vessel irregularity but no evidence of a flow limiting proximal stenosis. There is a severe left P3 branch vessel stenosis. No aneurysm is identified. Venous sinuses: Patent. Anatomic variants: Fetal left PCA. Review of the MIP images confirms the above findings CT Brain Perfusion Findings: ASPECTS: 10 CBF (<30%) Volume: 0 mL Perfusion (Tmax>6.0s) volume: 0 mL Mismatch Volume: 0 mL Infarction Location: N/a IMPRESSION: 1. Intracranial atherosclerosis without large vessel occlusion or flow limiting proximal stenosis. 2. Widely patent cervical carotid and vertebral arteries allowing for motion through the left vertebral origin. 3. No evidence of acute ischemia on CTP. These results were communicated to Dr. Theda Sers at 2:49 pm on 09/26/2020 by text page via the [facility] messaging system. Electronically Signed   By: Logan Bores M.D.   On: 09/26/2020 14:56   CT HEAD CODE STROKE WO CONTRAST  Result Date: 09/26/2020 CLINICAL DATA:  Code stroke. Left-sided facial deficits. Left arm and leg weakness. Personal history of brain tumor and seizures. EXAM: CT HEAD WITHOUT CONTRAST TECHNIQUE: Contiguous axial images were obtained from the base of the skull through the vertex without intravenous contrast. COMPARISON:  MR head without and with contrast 06/23/2020 and 04/21/2018. FINDINGS: Brain: Left frontal craniotomy again noted. Surgical cavity evident. Diffuse white matter disease is again seen. Remote lacunar infarct is present in the right basal ganglia. No significant changes present. Insular ribbon is normal bilaterally. Cortex is unremarkable. Brainstem and cerebellum are with in normal limits. Vascular: No hyperdense vessel or unexpected calcification. Skull: Left frontal craniotomy noted. Calvarium is otherwise intact. No focal lytic or blastic lesions are present. No significant extracranial soft tissue lesion is present. Sinuses/Orbits: The paranasal  sinuses and mastoid air cells are clear. The globes and orbits are within normal limits. ASPECTS [facility] Stroke Program Early CT Score) - Ganglionic level infarction (caudate, lentiform nuclei, internal capsule, insula, M1-M3 cortex): 7/7 - Supraganglionic infarction (M4-M6 cortex): 3/3 Total score (0-10 with 10 being normal): 10/10 IMPRESSION: 1. No acute intracranial abnormality or significant interval change. 2. Stable atrophy and white matter disease. 3. Left frontal craniotomy with resection cavity. 4. Aspects 10/10 5. The above was relayed via text pager to Dr. Theda Sers on 09/26/2020 at 14:32 . Electronically Signed   By: San Morelle M.D.   On: 09/26/2020 14:33    Scheduled Meds: .  stroke: mapping our early stages of recovery book   Does not apply Once  . aspirin EC  81 mg Oral Daily  .  atorvastatin  40 mg Oral Daily  . clopidogrel  75 mg Oral Daily  . enoxaparin (LOVENOX) injection  40 mg Subcutaneous Q24H  . escitalopram  10 mg Oral Daily  . famotidine  20 mg Oral Daily  . lamoTRIgine  225 mg Oral Daily  . lamoTRIgine  300 mg Oral QHS  . levothyroxine  112 mcg Oral QHS  . multivitamin with minerals  1 tablet Oral Daily   Continuous Infusions:   LOS: 1 day   Time spent: 37 minutes   Darliss Cheney, MD [facility]  09/27/2020, 12:01 PM   To contact the attending provider between 7A-7P or the covering provider during after hours 7P-7A, please log into the web site [email or web address].

## 2020-09-27 NOTE — Evaluation (Signed)
Occupational Therapy Evaluation Patient Details Name: Howard White MRN: [id number] DOB: [birth year] Today's Date: 09/27/2020    History of Present Illness Howard White is a 59 y.o. male with medical history significant of brain cancer, seizures and hypothyroidism.  Patient has been at his usual state of health until this afternoon around 1:30 PM when he noticed left sided weakness and left facial droop. MRI revealed acute infarct extending from R corona radiata into R basal ganglia.    Clinical Impression   This 59 y/o male presents with the above. PTA pt reports being independent with ADL and functional mobility, living with his parents. Pt very pleasant and willing to participate in therapy session; he currently presents with the above and below listed deficits. Pt currently requiring up to maxA for bed mobility and modA for sit<>stand from EOB. Attempted lateral/scoot towards [facility] however pt currently with difficulty motor planning, sequencing, and following instruction and ultimately initiating returning himself to supine (vs scooting). Pt requiring up to [place] for toileting ADL, modA for seated UB ADL. Pt with good family support who are able to provide supervision/assist at time of discharge. He will benefit from continued acute OT services and currently feel he is an excellent candidate for CIR level therapies at time of discharge to maximize his overall safety and independence with ADL and mobility. Will follow.     Follow Up Recommendations  CIR    Equipment Recommendations  3 in 1 bedside commode;Other (comment) (TBD)    Recommendations for Other Services Rehab consult     Precautions / Restrictions Precautions Precautions: Fall Restrictions Weight Bearing Restrictions: No      Mobility Bed Mobility Overal bed mobility: Needs Assistance Bed Mobility: Supine to Sit;Sit to Supine     Supine to sit: HOB elevated;Max assist Sit to supine: Mod assist   General bed mobility  comments: assist for LLE and trunk elevation with VCs for sequencing through task, assist to scoot hips towards EOB; assist to support trunk when returning to supine, pt able to pull himself towards [facility] with minA with directional cues/instruction     Transfers Overall transfer level: Needs assistance Equipment used:  (front to front transfer with gait belt) Transfers: Sit to/from [employer] Transfers Sit to Stand: Mod assist         General transfer comment: requires use of momentum with assist to power up to stand. VCs for upright posture. pt already had been OOB to recliner and recently back to bed so deferred further transfer attempts     Balance Overall balance assessment: Needs assistance Sitting-balance support: Feet supported;Bilateral upper extremity supported Sitting balance-Leahy Scale: Fair Sitting balance - Comments: pt with posterior bias but can maintain with verbal cues     Standing balance-Leahy Scale: Poor Standing balance comment: dependent on physical therapist                           ADL either performed or assessed with clinical judgement   ADL Overall ADL's : Needs assistance/impaired Eating/Feeding: Minimal assistance;Sitting   Grooming: Min guard;Sitting;Wash/dry face Grooming Details (indicate cue type and reason): seated EOB Upper Body Bathing: Minimal assistance;Sitting   Lower Body Bathing: Maximal assistance;Sit to/from stand   Upper Body Dressing : Moderate assistance;Sitting   Lower Body Dressing: Maximal assistance;Sit to/from stand Lower Body Dressing Details (indicate cue type and reason): modA for sit<>stand      Toileting- Clothing Manipulation and Hygiene: Total assistance;Sit to/from stand  Functional mobility during ADLs: Moderate assistance (sit<>stand )       Vision Baseline Vision/History: Wears glasses Wears Glasses: At all times Vision Assessment?: Vision impaired- to be further tested in  functional context (suspecct inattention/neglect )     Perception Perception Perception Tested?: Yes Perception Deficits: Inattention/neglect Inattention/Neglect: Does not attend to left side of body;Impaired- to be further tested in functional context;Does not attend to left visual field Comments: mod cues to locate call bell located to L    Praxis      Pertinent Vitals/Pain Pain Assessment: No/denies pain     Hand Dominance Right   Extremity/Trunk Assessment Upper Extremity Assessment Upper Extremity Assessment: LUE deficits/detail LUE Deficits / Details: elbow 3-/5, 2/5 at shoulder, 2-/5 grip/wrist LUE Coordination: decreased fine motor;decreased gross motor   Lower Extremity Assessment Lower Extremity Assessment: Defer to PT evaluation   Cervical / Trunk Assessment Cervical / Trunk Assessment: Kyphotic (forward head)   Communication Communication Communication: No difficulties   Cognition Arousal/Alertness: Awake/alert Behavior During Therapy: WFL for tasks assessed/performed;Impulsive Overall Cognitive Status: Impaired/Different from baseline Area of Impairment: Problem solving;Memory;Following commands;Safety/judgement;Awareness;Attention                   Current Attention Level: Selective Memory: Decreased short-term memory Following Commands: Follows one step commands with increased time Safety/Judgement: Decreased awareness of safety;Decreased awareness of deficits Awareness: Emergent Problem Solving: Slow processing;Difficulty sequencing;Requires verbal cues;Requires tactile cues General Comments: pt mildly impulsive and with difficulty sequencing/motor planning mobility tasks, requires cues to do so and for safety; pt repeating some questions asked during session and unaware (how long OT had been working for)    General Comments       Exercises     Shoulder Instructions      Home Living Family/patient expects to be discharged to:: Private  residence Living Arrangements: Parent (parents) Available Help at Discharge: Family;Available 24 hours/day Type of Home: House Home Access: Level entry     Home Layout: Two level;Able to live on main level with bedroom/bathroom Alternate Level Stairs-Number of Steps: flight Alternate Level Stairs-Rails: Right Bathroom Shower/Tub: Occupational psychologist: Standard     Home Equipment: Hand held shower head          Prior Functioning/Environment Level of Independence: Independent        Comments: retired due to Animal nutritionist, indep with ADLs        OT Problem List: Decreased strength;Decreased range of motion;Decreased activity tolerance;Impaired balance (sitting and/or standing);Impaired vision/perception;Decreased coordination;Decreased cognition;Decreased safety awareness;Decreased knowledge of use of DME or AE;Impaired UE functional use      OT Treatment/Interventions: Self-care/ADL training;Therapeutic exercise;Neuromuscular education;Energy conservation;DME and/or AE instruction;Therapeutic activities;Cognitive remediation/compensation;Patient/family education;Balance training;Visual/perceptual remediation/compensation    OT Goals(Current goals can be found in the care plan section) Acute Rehab OT Goals Patient Stated Goal: to get better OT Goal Formulation: With patient Time For Goal Achievement: 10/11/20 Potential to Achieve Goals: Good  OT Frequency: Min 2X/week   Barriers to D/C:            Co-evaluation              AM-PAC OT "6 Clicks" Daily Activity     Outcome Measure Help from another person eating meals?: A Little Help from another person taking care of personal grooming?: A Lot Help from another person toileting, which includes using toliet, bedpan, or urinal?: Total Help from another person bathing (including washing, rinsing, drying)?: A Lot Help from another person to put  on and taking off regular upper body clothing?: A  Lot Help from another person to put on and taking off regular lower body clothing?: A Lot 6 Click Score: 12   End of Session Equipment Utilized During Treatment: Gait belt Nurse Communication: Mobility status  Activity Tolerance: Patient tolerated treatment well Patient left: in bed;with call bell/phone within reach;with bed alarm set;with family/visitor present  OT Visit Diagnosis: Hemiplegia and hemiparesis;Other symptoms and signs involving cognitive function;Other abnormalities of gait and mobility (R26.89) Hemiplegia - Right/Left: Left Hemiplegia - dominant/non-dominant: Non-Dominant Hemiplegia - caused by: Cerebral infarction                Time: 3838-1840 OT Time Calculation (min): 22 min Charges:  OT General Charges $OT Visit: 1 Visit OT Evaluation $OT Eval Moderate Complexity: [place], OT Acute Rehabilitation Services Pager [phone] Office [phone]   Raymondo Band 09/27/2020, 2:00 PM

## 2020-09-27 NOTE — Progress Notes (Signed)
Pt has arrived to [address]. Alert and oriented x 4, VS stable, no signs of acute distress. Denied SOB, chest pain, and any pain at this time. Pt identified appropriately. Cardiac monitor placed and CCMD notified. Pt oriented to room and equipment, instructed to call for assistance and how to use call bell. His call bell left within reach.  Will continue to monitor pt and treat per MD orders.

## 2020-09-27 NOTE — Evaluation (Signed)
Physical Therapy Evaluation Patient Details Name: Howard White MRN: [id number] DOB: [birth year] Today's Date: 09/27/2020   History of Present Illness  Howard White is a 59 y.o. male with medical history significant of brain cancer, seizures and hypothyroidism.  Patient has been at his usual state of health until this afternoon around 1:30 PM when he noticed left sided weakness and left facial droop. MRI revealed acute infarct extending from R corona radiata into R basal ganglia.     Clinical Impression  Pt admitted with above. Pt was indep and living with his parents PTA. Pt now presenting with L hemiplegia but is very motivated and eager to get better. Pt with noted increased tone in L LE and is stronger in L LE than L UE. Focused on EOB sitting balance, with verbal cues pt able to correct and achieve midline but has difficulty sustaining. Focused on wbing through L LE in sitting. Pt requiring mod/maxA for transfers and will need 2 person assist to progress ambulation. Pt to strongly benefit from CIR upon d/c for maximal functional recovery. Acute PT to cont to follow.    Follow Up Recommendations CIR    Equipment Recommendations   (TBD at next venue)    Recommendations for Other Services Rehab consult     Precautions / Restrictions Restrictions Weight Bearing Restrictions: No      Mobility  Bed Mobility Overal bed mobility: Needs Assistance Bed Mobility: Supine to Sit     Supine to sit: Mod assist;HOB elevated     General bed mobility comments: max directional verbal cues, pt able to move LEs to EOB with directional cues, pt attempted to pull self into long sit up unable, cues to use R UE to pull on bed rail, modA to elevate trunk and scoot to EOB    Transfers Overall transfer level: Needs assistance Equipment used:  (front to front transfer with gait belt) Transfers: Sit to/from [employer] Sit to Stand: Mod assist Stand pivot transfers: Max assist        General transfer comment: modA to power up, pt able to move R LE, maxA for L LE advancement during stant pvt, L LE with increased tone so know buckling, verbal cues for pt to stand up tall  Ambulation/Gait             General Gait Details: able to transfer to chair, deferred amb due to needing second person for safety  Stairs            Wheelchair Mobility    Modified Rankin (Stroke Patients Only) Modified Rankin (Stroke Patients Only) Pre-Morbid Rankin Score: Slight disability Modified Rankin: Severe disability     Balance Overall balance assessment: Needs assistance Sitting-balance support: Feet supported;Bilateral upper extremity supported Sitting balance-Leahy Scale: Fair Sitting balance - Comments: pt with posterior bias but can maintain with verbal cues, maxA to don socks      Standing balance-Leahy Scale: Poor Standing balance comment: dependent on physical therapist                             Pertinent Vitals/Pain Pain Assessment: No/denies pain    Home Living Family/patient expects to be discharged to:: Private residence Living Arrangements: Parent (parents) Available Help at Discharge: Family;Available 24 hours/day Type of Home: House Home Access: Level entry     Home Layout: Two level;Able to live on main level with bedroom/bathroom        Prior Function Level  of Independence: Independent         Comments: retired due to Animal nutritionist, indep with ADLs     Hand Dominance   Dominant Hand: Right    Extremity/Trunk Assessment   Upper Extremity Assessment Upper Extremity Assessment: LUE deficits/detail LUE Deficits / Details: elbow 3-/5, 2/5 at shoulder, 2-/5 grip/wrist    Lower Extremity Assessment Lower Extremity Assessment: LLE deficits/detail LLE Deficits / Details: increased tone/adductor, able to complete LAQ in sitting but not able to bring knee to chest in bed    Cervical / Trunk Assessment Cervical  / Trunk Assessment: Kyphotic (forward head)  Communication   Communication: No difficulties  Cognition Arousal/Alertness: Awake/alert Behavior During Therapy: WFL for tasks assessed/performed Overall Cognitive Status: Impaired/Different from baseline Area of Impairment: Problem solving                             Problem Solving: Slow processing;Difficulty sequencing;Requires verbal cues;Requires tactile cues General Comments: pt alert and oriented x 4, pt aware of deficits, due to [name] pt with delayed processing but able to follow commands and initiates all task asked      General Comments General comments (skin integrity, edema, etc.): VSS, increased tone    Exercises     Assessment/Plan    PT Assessment Patient needs continued PT services  PT Problem List Decreased strength;Decreased range of motion;Decreased activity tolerance;Decreased balance;Decreased mobility;Decreased coordination;Decreased cognition;Decreased knowledge of use of DME;Decreased safety awareness       PT Treatment Interventions DME instruction;Gait training;Functional mobility training;Therapeutic activities;Stair training;Therapeutic exercise;Balance training;Neuromuscular re-education;Cognitive remediation    PT Goals (Current goals can be found in the Care Plan section)  Acute Rehab PT Goals Patient Stated Goal: to get better PT Goal Formulation: With patient Time For Goal Achievement: 10/11/20 Potential to Achieve Goals: Good    Frequency Min 4X/week   Barriers to discharge        Co-evaluation               AM-PAC PT "6 Clicks" Mobility  Outcome Measure Help needed turning from your back to your side while in a flat bed without using bedrails?: A Little Help needed moving from lying on your back to sitting on the side of a flat bed without using bedrails?: A Little Help needed moving to and from a bed to a chair (including a wheelchair)?: A Lot Help needed  standing up from a chair using your arms (e.g., wheelchair or bedside chair)?: A Lot Help needed to walk in hospital room?: Total Help needed climbing 3-5 steps with a railing? : Total 6 Click Score: 12    End of Session Equipment Utilized During Treatment: Gait belt Activity Tolerance: Patient tolerated treatment well Patient left: in chair;with call bell/phone within reach;with family/visitor present Nurse Communication: Mobility status PT Visit Diagnosis: Difficulty in walking, not elsewhere classified (R26.2);Hemiplegia and hemiparesis Hemiplegia - Right/Left: Left Hemiplegia - dominant/non-dominant: Non-dominant Hemiplegia - caused by: Cerebral infarction    Time: 5732-2025 PT Time Calculation (min) (ACUTE ONLY): 33 min   Charges:   PT Evaluation $PT Eval Moderate Complexity: 1 Mod PT Treatments $Therapeutic Activity: 8-22 mins        Kittie Plater, PT, DPT Acute Rehabilitation Services Pager #: [phone] Office #: [phone]   Berline Lopes 09/27/2020, 10:54 AM

## 2020-09-27 NOTE — Plan of Care (Signed)
  Problem: Education: Goal: Knowledge of disease or condition will improve Outcome: Progressing Goal: Knowledge of secondary prevention will improve Outcome: Progressing Goal: Knowledge of patient specific risk factors addressed and post discharge goals established will improve Outcome: Progressing Goal: Individualized Educational Video(s) Outcome: Progressing   Problem: Coping: Goal: Will verbalize positive feelings about self Outcome: Progressing   Problem: Health Behavior/Discharge Planning: Goal: Ability to manage health-related needs will improve Outcome: Progressing   Problem: Self-Care: Goal: Ability to participate in self-care as condition permits will improve Outcome: Progressing Goal: Verbalization of feelings and concerns over difficulty with self-care will improve Outcome: Progressing Goal: Ability to communicate needs accurately will improve Outcome: Progressing   Problem: Nutrition: Goal: Risk of aspiration will decrease Outcome: Progressing Goal: Dietary intake will improve Outcome: Progressing   Problem: Ischemic Stroke/TIA Tissue Perfusion: Goal: Complications of ischemic stroke/TIA will be minimized Outcome: Progressing   Problem: Education: Goal: Knowledge of General Education information will improve Description: Including pain rating scale, medication(s)/side effects and non-pharmacologic comfort measures Outcome: Progressing   Problem: Health Behavior/Discharge Planning: Goal: Ability to manage health-related needs will improve Outcome: Progressing   Problem: Clinical Measurements: Goal: Ability to maintain clinical measurements within normal limits will improve Outcome: Progressing Goal: Will remain free from infection Outcome: Progressing Goal: Diagnostic test results will improve Outcome: Progressing Goal: Respiratory complications will improve Outcome: Progressing Goal: Cardiovascular complication will be avoided Outcome: Progressing    Problem: Activity: Goal: Risk for activity intolerance will decrease Outcome: Progressing   Problem: Nutrition: Goal: Adequate nutrition will be maintained Outcome: Progressing   Problem: Coping: Goal: Level of anxiety will decrease Outcome: Progressing   Problem: Elimination: Goal: Will not experience complications related to bowel motility Outcome: Progressing Goal: Will not experience complications related to urinary retention Outcome: Progressing   Problem: Pain Managment: Goal: General experience of comfort will improve Outcome: Progressing   Problem: Safety: Goal: Ability to remain free from injury will improve Outcome: Progressing   Problem: Skin Integrity: Goal: Risk for impaired skin integrity will decrease Outcome: Progressing   

## 2020-09-27 NOTE — Progress Notes (Signed)
Inpatient Rehab Admissions Coordinator Note:   Per therapy recommendations, pt was screened for CIR candidacy by Nikyla Navedo, MS CCC-SLP. At this time, Pt. Appears to have functional decline and is a good candidate for CIR. Will place order for rehab consult per protocol.  Please contact me with questions.   Jaben Benegas, MS, CCC-SLP Rehab Admissions Coordinator  [phone] (celll) [phone] (office)  

## 2020-09-27 NOTE — Progress Notes (Signed)
STROKE TEAM PROGRESS NOTE   INTERVAL HISTORY His mom is at the bedside.  Pt is lying in bed, AAO x 3, still has left hemiparesis, arm more than leg. PT/OT recommend CIR.   OBJECTIVE Vitals:   09/26/20 1931 09/27/20 0100 09/27/20 0300 09/27/20 0500  BP: (!) 138/91 (!) 148/82 (!) 143/91 110/71  Pulse:  67 63 65  Resp: 18 18 18 18   Temp: 97.7 F (36.5 C) 98 F (36.7 C) 98 F (36.7 C) 98.2 F (36.8 C)  TempSrc: Oral Oral Oral Oral  SpO2: 96% 95% 95% 97%  Weight: 86.6 kg     Height: 5\' 10"  (1.778 m)       CBC:  Recent Labs  Lab 09/26/20 1419 09/26/20 1432 09/26/20 1748 09/27/20 0501  WBC 6.8   < > 7.4 7.5  NEUTROABS 3.6  --   --   --   HGB 15.5   < > 15.2 16.2  HCT 46.4   < > 46.2 47.0  MCV 94.3   < > 94.3 91.8  PLT 230   < > 256 268   < > = values in this interval not displayed.    Basic Metabolic Panel:  Recent Labs  Lab 09/26/20 1419 09/26/20 1419 09/26/20 1432 09/26/20 1432 09/26/20 1748 09/27/20 0501  NA 136   < > 137  --   --  136  K 5.5*   < > 4.9  --   --  3.8  CL 101   < > 101  --   --  103  CO2 27  --   --   --   --  25  GLUCOSE 101*   < > 97  --   --  97  BUN 17   < > 23*  --   --  15  CREATININE 1.10   < > 1.00   < > 1.03 1.00  CALCIUM 9.4  --   --   --   --  9.5   < > = values in this interval not displayed.    Lipid Panel:     Component Value Date/Time   CHOL 199 09/27/2020 0501   TRIG 130 09/27/2020 0501   HDL 42 09/27/2020 0501   CHOLHDL 4.7 09/27/2020 0501   VLDL 26 09/27/2020 0501   LDLCALC 131 (H) 09/27/2020 0501   HgbA1c:  Lab Results  Component Value Date   HGBA1C 5.4 09/27/2020   Urine Drug Screen: No results found for: LABOPIA, COCAINSCRNUR, LABBENZ, AMPHETMU, THCU, LABBARB  Alcohol Level No results found for: ETH  IMAGING  CT Angio Head W or Wo Contrast CT Angio Neck W and/or Wo Contrast CT CEREBRAL PERFUSION W CONTRAST 09/26/2020 IMPRESSION:  1. Intracranial atherosclerosis without large vessel occlusion or flow  limiting proximal stenosis.  2. Widely patent cervical carotid and vertebral arteries allowing for motion through the left vertebral origin.  3. No evidence of acute ischemia on CTP.   MR BRAIN WO CONTRAST 09/26/2020 IMPRESSION:  1.9 x 1.2 cm acute infarct extending from the right corona radiata into the right basal ganglia. Chronic findings which are stable as compared to the MRI of 06/23/2020, as follows. Prior left frontoparietal craniotomy with left frontal lobe resection cavity. Extensive T2 hyperintense signal abnormality throughout the cerebral white matter, nonspecific but likely reflecting a combination of post-treatment changes and chronic small vessel ischemic disease. Chronic small vessel ischemic changes are also present within the deep gray nuclei.   CT HEAD CODE STROKE  WO CONTRAST 09/26/2020 IMPRESSION:  1. No acute intracranial abnormality or significant interval change.  2. Stable atrophy and white matter disease.  3. Left frontal craniotomy with resection cavity.  4. Aspects 10/10   Transthoracic Echocardiogram  1. Left ventricular ejection fraction, by estimation, is 60 to 65%. The  left ventricle has normal function. The left ventricle has no regional  wall motion abnormalities. Left ventricular diastolic parameters are  indeterminate.  2. Right ventricular systolic function is normal. The right ventricular  size is normal. There is normal pulmonary artery systolic pressure.  3. The mitral valve is normal in structure. No evidence of mitral valve  regurgitation. No evidence of mitral stenosis.  4. The aortic valve is normal in structure. Aortic valve regurgitation is  not visualized. No aortic stenosis is present.  5. The inferior vena cava is normal in size with greater than 50%  respiratory variability, suggesting right atrial pressure of 3 mmHg.   ECG - SR rate 62 BPM. (See cardiology reading for complete details)   PHYSICAL EXAM  Temp:  [97.7 F (36.5  C)-98.2 F (36.8 C)] 98.2 F (36.8 C) (11/13 0500) Pulse Rate:  [48-70] 65 (11/13 0500) Resp:  [12-24] 18 (11/13 0500) BP: (110-148)/(71-96) 110/71 (11/13 0500) SpO2:  [90 %-97 %] 97 % (11/13 0500) Weight:  [76.2 kg-86.6 kg] 86.6 kg (11/12 1931)  General - Well nourished, well developed, in no apparent distress. Left scalp scar tissue  Ophthalmologic - fundi not visualized due to noncooperation.  Cardiovascular - Regular rhythm and rate.  Mental Status -  Level of arousal and orientation to time, place, and person were intact. Language including expression, naming, repetition, comprehension was assessed and found intact.  Cranial Nerves II - XII - II - Visual field intact OU. III, IV, VI - Extraocular movements intact. V - Facial sensation intact bilaterally. VII - left facial droop. VIII - Hearing & vestibular intact bilaterally. X - Palate elevates symmetrically. XI - Chin turning & shoulder shrug intact bilaterally. XII - Tongue protrusion intact.  Motor Strength - The patient's strength was normal in RUE and RLE extremities, however, LUE3/5 deltiod, bicep and tricep, finger grip 4/5 and finger extension 1/5. LLE proximal 3+/5, ankle DF 4/5 and PF 5-/5.Bulk was normal and fasciculations were absent.   Motor Tone - Muscle tone was assessed at the neck and appendages and was normal.  Reflexes - The patient's reflexes were symmetrical in all extremities and he had no pathological reflexes.  Sensory - Light touch, temperature/pinprick were assessed and were symmetrical.    Coordination - The patient had normal movements in the right hand with no ataxia or dysmetria.  Tremor was absent.  Gait and Station - deferred.   ASSESSMENT/PLAN [name] is a 59 y.o. male with history of astrocytoma with left frontal craniotomy resection 20 years ago, seizures, depression and hypothyroidism, presenting with right arm weakness and leg weakness. He did not receive IV t-PA due to hx  of brain tumor.  Stroke: acute infarct right BG/CR likely due to small vessel disease.   CT Head - No acute intracranial abnormality or significant interval change. Stable atrophy and white matter disease. Left frontal craniotomy with resection cavity.   MRI head - 1.9 x 1.2 cm acute infarct extending from the right corona radiata into the right basal ganglia. Prior left frontoparietal craniotomy with left frontal lobe resection cavity.   CTA H&N - Intracranial atherosclerosis without large vessel occlusion or flow limiting proximal stenosis. Marland Kitchen  CT Perfusion - No evidence of acute ischemia on CTP.  2D Echo EF 60-65%  [employer] Virus 2  - negative  LDL - 131  HgbA1c - 5.4  VTE prophylaxis - Lovenox  No antithrombotic prior to admission, now on aspirin 81 mg daily and clopidogrel 75 mg daily. Continue DAPT for 3 weeks, and then ASA alone.  Patient counseled to be compliant with his antithrombotic medications  Ongoing aggressive stroke risk factor management  Therapy recommendations:  CIR recommended   Disposition:  Pending  Hyperlipidemia  Home Lipid lowering medication: none   LDL 131, goal < 70  Current lipid lowering medication: Lipitor 40 mg daily   Continue statin at discharge  Other Stroke Risk Factors  ETOH use, advised to drink no more than 1 alcoholic beverage per day.  Other Active Problems  Code status - Full code  Hx of Seizure - Lamictal  Astrocytoma s/p left frontal cranio with radiation 20 years ago  Hospital day # 1  Neurology will sign off. Please call with questions. Pt will follow up with stroke clinic NP at [facility] in about 4 weeks. Thanks for the consult.  Rosalin Hawking, MD PhD Stroke Neurology 09/27/2020 2:34 PM   To contact Stroke Continuity provider, please refer to [email or web address] After hours, contact General Neurology

## 2020-09-27 NOTE — Progress Notes (Signed)
  Echocardiogram 2D Echocardiogram has been performed.  Fidel Levy 09/27/2020, 12:23 PM

## 2020-09-28 NOTE — Progress Notes (Signed)
Inpatient Rehab Admissions Coordinator:   Met with patient at bedside to discuss potential CIR admission. Pt. Stated interest. Will pursue for potential admit this week, pending bed availability and insurance auth. Pt.'s mother, Avrum Kimball, was present in room for discussion and confirmed that she can provide 24/7 support at d/c.   Clemens Catholic, [place], [place] Admissions Coordinator  [phone] ([place]) [phone] (office)

## 2020-09-28 NOTE — Plan of Care (Signed)
  Problem: Education: Goal: Knowledge of disease or condition will improve Outcome: Progressing Goal: Knowledge of secondary prevention will improve Outcome: Progressing Goal: Knowledge of patient specific risk factors addressed and post discharge goals established will improve Outcome: Progressing Goal: Individualized Educational Video(s) Outcome: Progressing   Problem: Coping: Goal: Will verbalize positive feelings about self Outcome: Progressing   Problem: Health Behavior/Discharge Planning: Goal: Ability to manage health-related needs will improve Outcome: Progressing   Problem: Self-Care: Goal: Ability to participate in self-care as condition permits will improve Outcome: Progressing Goal: Verbalization of feelings and concerns over difficulty with self-care will improve Outcome: Progressing Goal: Ability to communicate needs accurately will improve Outcome: Progressing   Problem: Nutrition: Goal: Risk of aspiration will decrease Outcome: Progressing Goal: Dietary intake will improve Outcome: Progressing   Problem: Ischemic Stroke/TIA Tissue Perfusion: Goal: Complications of ischemic stroke/TIA will be minimized Outcome: Progressing   Problem: Education: Goal: Knowledge of General Education information will improve Description: Including pain rating scale, medication(s)/side effects and non-pharmacologic comfort measures Outcome: Progressing   Problem: Health Behavior/Discharge Planning: Goal: Ability to manage health-related needs will improve Outcome: Progressing   Problem: Clinical Measurements: Goal: Ability to maintain clinical measurements within normal limits will improve Outcome: Progressing Goal: Will remain free from infection Outcome: Progressing Goal: Diagnostic test results will improve Outcome: Progressing Goal: Respiratory complications will improve Outcome: Progressing Goal: Cardiovascular complication will be avoided Outcome: Progressing    Problem: Activity: Goal: Risk for activity intolerance will decrease Outcome: Progressing   Problem: Nutrition: Goal: Adequate nutrition will be maintained Outcome: Progressing   Problem: Coping: Goal: Level of anxiety will decrease Outcome: Progressing   Problem: Elimination: Goal: Will not experience complications related to bowel motility Outcome: Progressing Goal: Will not experience complications related to urinary retention Outcome: Progressing   Problem: Pain Managment: Goal: General experience of comfort will improve Outcome: Progressing   Problem: Safety: Goal: Ability to remain free from injury will improve Outcome: Progressing   Problem: Skin Integrity: Goal: Risk for impaired skin integrity will decrease Outcome: Progressing   

## 2020-09-28 NOTE — PMR Pre-admission (Signed)
PMR Admission Coordinator Pre-Admission Assessment  Patient: Howard White is an 59 y.o., male MRN: [id number] DOB: 30-Dec-1960 Height: '5\' 10"'  (177.8 cm) Weight: 86.6 kg              Insurance Information HMO:  yes   PPO:      PCP:      IPA:      80/20:      OTHER:  PRIMARY: [employer]       Policy#: [id number]      Subscriber: Patient CM Name: Tammy      Phone#: [phone]     Fax#:  Pre-Cert#: [id number]      Employer:  Benefits:  Phone #: [phone]     Name: Irene Shipper. Date: 11/15/2018-current    Deduct: $0      Out of Pocket Max:$3,400 ($150 met)in network , Combined in and out of network: $5,100 ($150 met)    Life Max: n/a  CIR:  $295/day for days 1-6, $0 dollars for remaining days    SNF:$0 days 1-20, $178 days 21+ Outpatient: $15 copay    Home Health: Covered at 100% DME: 80%     Co-Pay: 20% Providers: in network  SECONDARY: none  The "Data Collection Information Summary" for patients in Inpatient Rehabilitation Facilities with attached "Privacy Act [place] Records" was provided and verbally reviewed with: Patient  Emergency Contact Information Contact Information    Name Relation Home Work [place] Mother [phone]  [phone]     Current Medical History  Patient Admitting Diagnosis: CVA History of Present Illness: Howard White is a 59 y.o. 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  Per chart review patient lives with elderly parents.  Reportedly independent prior to admission.  Patient with bed and bath on main level.  Presented 09/26/2020 with left-sided weakness and facial droop.  Cranial CT scan showed no acute intracranial abnormality or significant interval change.  Left frontal craniotomy with resection cavity noted.  CT angiogram of head and neck without large vessel  occlusion or limiting proximal stenosis.  MRI of the brain showed a 1.9 x 1 2.  Centimeter acute infarct extending from the right corona radiata into the right basal ganglia.  Echocardiogram with ejection fraction of 60 to 65% no wall motion abnormalities.  Admission chemistries unremarkable except potassium 5.5 glucose 101.  Currently maintained on aspirin and Plavix for CVA prophylaxis.  Tolerating a regular diet. Complete NIHSS TOTAL: 5 Glasgow Coma Scale Score: 15  Past Medical History  Past Medical History:  Diagnosis Date  . Brain cancer ([place])   . Seizures ([place])   . Thyroid disease    Hypothyroid    Family History  family history includes Brain cancer in his unknown relative; Cancer in his unknown relative; Diabetes in his unknown relative; Stroke in his unknown relative; Thyroid disease in his unknown relative.  Prior Rehab/Hospitalizations:  Has the patient had prior rehab or hospitalizations prior to admission? No  Has the patient had major surgery during 100 days prior to admission? No  Current Medications   Current Facility-Administered Medications:  .   stroke: mapping our early stages of recovery book, , Does not apply, Once, Arrien, Jimmy Picket, MD .  acetaminophen (TYLENOL) tablet 650 mg, 650 mg, Oral, Q6H PRN **OR** acetaminophen (TYLENOL) suppository 650 mg, 650 mg, Rectal, Q6H PRN, Arrien, Jimmy Picket,  MD .  aspirin EC tablet 81 mg, 81 mg, Oral, Daily, Rosalin Hawking, MD, 81 mg at 09/30/20 0834 .  atorvastatin (LIPITOR) tablet 40 mg, 40 mg, Oral, Daily, Rosalin Hawking, MD, 40 mg at 09/30/20 0833 .  clopidogrel (PLAVIX) tablet 75 mg, 75 mg, Oral, Daily, Rosalin Hawking, MD, 75 mg at 09/30/20 0833 .  docusate sodium (COLACE) capsule 100 mg, 100 mg, Oral, BID, Pahwani, Ravi, MD, 100 mg at 09/30/20 0833 .  escitalopram (LEXAPRO) tablet 10 mg, 10 mg, Oral, Daily, Arrien, Jimmy Picket, MD, 10 mg at 09/30/20 0834 .  famotidine (PEPCID) tablet 20 mg, 20 mg, Oral, Daily,  Arrien, Jimmy Picket, MD, 20 mg at 09/30/20 [phone] .  fluticasone (FLONASE) 50 MCG/ACT nasal spray 2 spray, 2 spray, Each Nare, Daily PRN, Arrien, Jimmy Picket, MD, 2 spray at 09/30/20 [phone] .  lamoTRIgine (LAMICTAL) tablet 225 mg, 225 mg, Oral, Daily, Arrien, Jimmy Picket, MD, 225 mg at 09/30/20 [phone] .  lamoTRIgine (LAMICTAL) tablet 300 mg, 300 mg, Oral, QHS, Arrien, Jimmy Picket, MD, 300 mg at 09/29/20 2034 .  levothyroxine (SYNTHROID) tablet 112 mcg, 112 mcg, Oral, QHS, Arrien, Jimmy Picket, MD, 112 mcg at 09/29/20 2034 .  multivitamin with minerals tablet 1 tablet, 1 tablet, Oral, Daily, Arrien, Jimmy Picket, MD, 1 tablet at 09/30/20 [phone] .  ondansetron (ZOFRAN) tablet 4 mg, 4 mg, Oral, Q6H PRN **OR** ondansetron (ZOFRAN) injection 4 mg, 4 mg, Intravenous, Q6H PRN, Arrien, Jimmy Picket, MD .  white petrolatum (VASELINE) gel, , Topical, PRN, Arrien, Jimmy Picket, MD, 0.2 application at 13/08/65 2250  Patients Current Diet:  Diet Order            Diet Heart Room service appropriate? Yes; Fluid consistency: Thin  Diet effective now                 Precautions / Restrictions Precautions Precautions: Fall Precaution Comments: [name] Restrictions Weight Bearing Restrictions: No   Has the patient had 2 or more falls or a fall with injury in the past year?No  Prior Activity Level Community (5-7x/wk): Pt. was active in the community PTA  Prior Functional Level Prior Function Level of Independence: Independent Comments: retired due to Animal nutritionist, indep with ADLs  Self Care: Did the patient need help bathing, dressing, using the toilet or eating?  Independent  Indoor Mobility: Did the patient need assistance with walking from room to room (with or without device)? Independent  Stairs: Did the patient need assistance with internal or external stairs (with or without device)? Independent  Functional Cognition: Did the patient need help  planning regular tasks such as shopping or remembering to take medications? Needed some help  Home Assistive Devices / [name] Devices/Equipment: None Home Equipment: Hand held shower head  Prior Device Use: Indicate devices/aids used by the patient prior to current illness, exacerbation or injury? None of the above  Current Functional Level Cognition  Arousal/Alertness: Awake/alert Overall Cognitive Status: Impaired/Different from baseline Current Attention Level: Selective Orientation Level: Oriented X4 Following Commands: Follows one step commands consistently, Follows multi-step commands inconsistently Safety/Judgement: Decreased awareness of safety, Decreased awareness of deficits General Comments: Pt quite impulsive, when asked if it would be SAFE for him to transfer himself back to bed he said, " I could do it" and then when reiterated would it be safe he said, "probably not" so I made sure he was on a chair alarm when I left him up in the recliner and told his RN  he was up.   Attention: Focused, Sustained Focused Attention: Appears intact Sustained Attention: Appears intact Memory: Impaired Memory Impairment: Decreased short term memory Decreased Short Term Memory: Verbal basic Awareness: Appears intact Problem Solving: Impaired Problem Solving Impairment: Verbal basic, Verbal complex, Functional basic, Functional complex Executive Function: Reasoning, Organizing Reasoning: Impaired Reasoning Impairment: Verbal complex Organizing: Impaired Organizing Impairment: Verbal complex Safety/Judgment: Appears intact    Extremity Assessment (includes Sensation/Coordination)  Upper Extremity Assessment: LUE deficits/detail LUE Deficits / Details: elbow 3-/5, 2/5 at shoulder, 2-/5 grip/wrist LUE Coordination: decreased fine motor, decreased gross motor  Lower Extremity Assessment: Defer to PT evaluation LLE Deficits / Details: increased tone/adductor, able to  complete LAQ in sitting but not able to bring knee to chest in bed    ADLs  Overall ADL's : Needs assistance/impaired Eating/Feeding: Minimal assistance, Sitting Grooming: Min guard, Sitting, Wash/dry face Grooming Details (indicate cue type and reason): seated EOB Upper Body Bathing: Minimal assistance, Sitting Lower Body Bathing: Maximal assistance, Sit to/from stand Upper Body Dressing : Moderate assistance, Sitting Lower Body Dressing: Maximal assistance, Sitting/lateral leans, Bed level Lower Body Dressing Details (indicate cue type and reason): donning socks in bed today, able to don R sock, cueing for technique for L sock with max assist  Toileting- Clothing Manipulation and Hygiene: Total assistance, Sit to/from stand Functional mobility during ADLs: Moderate assistance (sit<>stand ) General ADL Comments: focused on L UE exercises today     Mobility  Overal bed mobility: Needs Assistance Bed Mobility: Supine to Sit Supine to sit: Mod assist, HOB elevated Sit to supine: Mod assist General bed mobility comments: Mod assist to prevent posterior LOB during transition to sitting.  Pt moving very quickly grasping at bed, rails, therapist to come up to sitting with his right hand.      Transfers  Overall transfer level: Needs assistance Equipment used: None Transfers: Sit to/from Stand, [employer] Transfers Sit to Stand: Mod assist Stand pivot transfers: Max assist Squat pivot transfers: Mod assist General transfer comment: Mod assist to power up to standing and prevent inital L lateral thrust, pt quick to move with squat pivot to drop arm recliner chiar on his right side.  Quick to move and uncoordinated resulting in him flinging himself over cues for safety and to go slow.     Ambulation / Gait / Stairs / Wheelchair Mobility  Ambulation/Gait General Gait Details: able to transfer to chair, deferred amb due to needing second person for safety    Posture / Balance Dynamic  Sitting Balance Sitting balance - Comments: when pt lets go of the bed rail he falls posteriorly and to the L requiring physcal assist to prevent LOB.  Balance Overall balance assessment: Needs assistance Sitting-balance support: Feet supported, Single extremity supported Sitting balance-Leahy Scale: Poor Sitting balance - Comments: when pt lets go of the bed rail he falls posteriorly and to the L requiring physcal assist to prevent LOB.  Postural control: Posterior lean, Left lateral lean Standing balance support: Single extremity supported Standing balance-Leahy Scale: Poor Standing balance comment: dependent on physical therapist and R hand supported for balance in standing.     Special needs/care consideration None     Previous Home Environment  Living Arrangements: Parent (parents)  Lives With: Family Available Help at Discharge: Family, Available 24 hours/day Type of Home: House Home Layout: Two level, Able to live on main level with bedroom/bathroom Alternate Level Stairs-Rails: Right Alternate Level Stairs-Number of Steps: flight Home Access: Level entry Bathroom Shower/Tub: Walk-in  shower Bathroom Toilet: Standard Bathroom Accessibility: Yes How Accessible: Accessible via walker Home Care Services: No  Discharge Living Setting Plans for Discharge Living Setting: Patient's home, House Type of Home at Discharge: House Discharge Home Layout: Two level, Able to live on main level with bedroom/bathroom Alternate Level Stairs-Rails: Right (15) Alternate Level Stairs-Number of Steps: 15 Discharge Home Access: Level entry Discharge Bathroom Shower/Tub: Tub/shower unit Discharge Bathroom Toilet: Standard Discharge Bathroom Accessibility: Yes How Accessible: Accessible via walker Does the patient have any problems obtaining your medications?: No  Social/Family/Support Systems Patient Roles: Parent Contact Information: [phone] Anticipated Caregiver: Forrester Blando Anticipated Caregiver's Contact Information: [phone] Ability/Limitations of Caregiver: Can provide Min A Caregiver Availability: 24/7 Discharge Plan Discussed with Primary Caregiver: Yes Is Caregiver In Agreement with Plan?: Yes Does Caregiver/Family have Issues with Lodging/Transportation while Pt is in Rehab?: No  Goals Patient/Family Goal for Rehab: PT/OT/SLP Min A Expected length of stay: 12-14 days Pt/Family Agrees to Admission and willing to participate: Yes Program Orientation Provided & Reviewed with Pt/Caregiver Including Roles  & Responsibilities: Yes  Decrease burden of Care through IP rehab admission: n/a  Possible need for SNF placement upon discharge: Not anticipated  Patient Condition: This patient's condition remains as documented in the consult dated 09/29/2020, in which the Rehabilitation Physician determined and documented that the patient's condition is appropriate for intensive rehabilitative care in an inpatient rehabilitation facility. Will admit to inpatient rehab today.  Preadmission Screen Completed By: Clemens Catholic with updates by  Cleatrice Burke, RN, 09/30/2020 10:08 AM ______________________________________________________________________   Discussed status with Dr. Ranell Patrick on 09/30/2020 at  1007 and received approval for admission today.  Admission Coordinator: Clemens Catholic with updates by  Cleatrice Burke, time 1007 Date 09/30/2020

## 2020-09-28 NOTE — Progress Notes (Signed)
PROGRESS NOTE    [name]  WNU:[id number] DOB: June 14, 1961 DOA: 09/26/2020 PCP: Seward Carol, MD   Brief Narrative:  Howard White is a 59 y.o. male with medical history significant of brain cancer, seizures and hypothyroidism.  Patient has been at his usual state of health until around 1:30 PM on 09/26/2020 when he noticed left sided weakness and left facial droop. His left-sided weakness is mainly affecting his left upper extremity and left face.  He has been seizure-free for about 5 years, a recent neurologic follow-up 4 months ago was normal. When he was diagnosed with his brain cancer he was symptomatic on the right side of his body.   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  Acute CVA: MRI brain shows 1.9 x 1.2 cm acute infarct extending from the right coronary radiator into the right basal ganglia.  Extensive T2 hyperintense signal abnormality throughout the cerebral white matter.  CT cerebral perfusion and CT angio of head and neck showed intracranial atherosclerosis without LVO.  Echo did not show any PFO or any thrombus.  Patient is still with left-sided upper and lower extremity weakness but it is improving.  Continue aspirin and Plavix.  PT OT recommended CIR. neurology signed off.  Continue DAPT for 3 weeks and then aspirin alone.  2.  Epilepsy, seems to be well controlled.  Continue with antiseizure medications, lamotrigine.   3.  History of brain astrocytoma.  No signs of recurrence.  4. Depression. Continue with escitalopram.   5. Hypothyroid. Continue levothyroxine.   6.  Hyperlipidemia: LDL 131.  Continue atorvastatin 40 mg.  DVT prophylaxis: SCDs Start: 09/26/20 1539   Code Status: Full Code  Family Communication: Mother present at bedside.  Plan of care discussed with  patient in length and he verbalized understanding and agreed with it.  Status is: Inpatient  Remains inpatient appropriate because:Inpatient level of care appropriate due to severity of illness   Dispo: The patient is from: Home              Anticipated d/c is to: CIR              Anticipated d/c date is: 2 days              Patient currently is medically stable to d/c.        Estimated body mass index is 27.39 kg/m as calculated from the following:   Height as of this encounter: 5\' 10"  (1.778 m).   Weight as of this encounter: 86.6 kg.      Nutritional status:               Consultants:   Neurology  Procedures:   None  Antimicrobials:  Anti-infectives (From admission, onward)   None         Subjective: Seen and examined.  Mother at the bedside.  Patient has no new complaint.  Weakness improving on the left side.  Objective: Vitals:   09/27/20 0500 09/27/20 1400 09/27/20 2201 09/28/20 0547  BP: 110/71 131/82 134/88 125/78  Pulse: 65 76 64 69  Resp: 18  18 19   Temp: 98.2 F (36.8 C) 98.6 F (37 C) 98.3 F (36.8 C) 98.5 F (36.9 C)  TempSrc: Oral Oral Oral Oral  SpO2: 97% 96% 97%  95%  Weight:      Height:        Intake/Output Summary (Last 24 hours) at 09/28/2020 1134 Last data filed at 09/28/2020 1030 Gross per 24 hour  Intake --  Output 300 ml  Net -300 ml   Filed Weights   09/26/20 1453 09/26/20 1931  Weight: 76.2 kg 86.6 kg    Examination:  General exam: Appears calm and comfortable  Respiratory system: Clear to auscultation. Respiratory effort normal. Cardiovascular system: S1 & S2 heard, RRR. No JVD, murmurs, rubs, gallops or clicks. No pedal edema. Gastrointestinal system: Abdomen is nondistended, soft and nontender. No organomegaly or masses felt. Normal bowel sounds heard. Central nervous system: Alert and oriented.  Left upper and lower extremity weakness improving.  Mild left facial droop. Extremities: Symmetric 5 x  5 power. Skin: No rashes, lesions or ulcers.  Psychiatry: Judgement and insight appear normal. Mood & affect appropriate.    Data Reviewed: I have personally reviewed following labs and imaging studies  CBC: Recent Labs  Lab 09/26/20 1419 09/26/20 1432 09/26/20 1748 09/27/20 0501  WBC 6.8  --  7.4 7.5  NEUTROABS 3.6  --   --   --   HGB 15.5 16.0 15.2 16.2  HCT 46.4 47.0 46.2 47.0  MCV 94.3  --  94.3 91.8  PLT 230  --  256 403   Basic Metabolic Panel: Recent Labs  Lab 09/26/20 1419 09/26/20 1432 09/26/20 1748 09/27/20 0501  NA 136 137  --  136  K 5.5* 4.9  --  3.8  CL 101 101  --  103  CO2 27  --   --  25  GLUCOSE 101* 97  --  97  BUN 17 23*  --  15  CREATININE 1.10 1.00 1.03 1.00  CALCIUM 9.4  --   --  9.5   GFR: Estimated Creatinine Clearance: 83.1 mL/min (by C-G formula based on SCr of 1 mg/dL). Liver Function Tests: Recent Labs  Lab 09/26/20 1419  AST 39  ALT 22  ALKPHOS 44  BILITOT 1.0  PROT 6.4*  ALBUMIN 4.2   No results for input(s): LIPASE, AMYLASE in the last 168 hours. No results for input(s): AMMONIA in the last 168 hours. Coagulation Profile: Recent Labs  Lab 09/26/20 1419  INR 1.0   Cardiac Enzymes: No results for input(s): CKTOTAL, CKMB, CKMBINDEX, TROPONINI in the last 168 hours. BNP (last 3 results) No results for input(s): PROBNP in the last 8760 hours. HbA1C: Recent Labs    09/27/20 0501  HGBA1C 5.4   CBG: Recent Labs  Lab 09/26/20 1418  GLUCAP 95   Lipid Profile: Recent Labs    09/27/20 0501  CHOL 199  HDL 42  LDLCALC 131*  TRIG 130  CHOLHDL 4.7   Thyroid Function Tests: No results for input(s): TSH, T4TOTAL, FREET4, T3FREE, THYROIDAB in the last 72 hours. Anemia Panel: No results for input(s): VITAMINB12, FOLATE, FERRITIN, TIBC, IRON, RETICCTPCT in the last 72 hours. Sepsis Labs: No results for input(s): PROCALCITON, LATICACIDVEN in the last 168 hours.  Recent Results (from the past 240 hour(s))    Respiratory Panel by RT PCR (Flu A&B, Covid) - Nasopharyngeal Swab     Status: None   Collection Time: 09/26/20  3:33 PM   Specimen: Nasopharyngeal Swab  Result Value Ref Range Status   SARS Coronavirus 2 by RT PCR NEGATIVE NEGATIVE Final    Comment: (NOTE) SARS-CoV-2 target nucleic acids are NOT DETECTED.  The SARS-CoV-2 RNA is generally  detectable in upper respiratoy specimens during the acute phase of infection. The lowest concentration of SARS-CoV-2 viral copies this assay can detect is 131 copies/mL. A negative result does not preclude SARS-Cov-2 infection and should not be used as the sole basis for treatment or other patient management decisions. A negative result may occur with  improper specimen collection/handling, submission of specimen other than nasopharyngeal swab, presence of viral mutation(s) within the areas targeted by this assay, and inadequate number of viral copies (<131 copies/mL). A negative result must be combined with clinical observations, patient history, and epidemiological information. The expected result is Negative.  Fact Sheet for Patients:  PinkCheek.be  Fact Sheet for Healthcare Providers:  GravelBags.it  This test is no t yet approved or cleared by the [place] FDA and  has been authorized for detection and/or diagnosis of SARS-CoV-2 by FDA under an Emergency Use Authorization (EUA). This EUA will remain  in effect (meaning this test can be used) for the duration of the COVID-19 declaration under Section 564(b)(1) of the Act, 21 U.S.C. section 360bbb-3(b)(1), unless the authorization is terminated or revoked sooner.     Influenza A by PCR NEGATIVE NEGATIVE Final   Influenza B by PCR NEGATIVE NEGATIVE Final    Comment: (NOTE) The Xpert Xpress SARS-CoV-2/FLU/RSV assay is intended as an aid in  the diagnosis of influenza from Nasopharyngeal swab specimens and  should not be used as  a sole basis for treatment. Nasal washings and  aspirates are unacceptable for Xpert Xpress SARS-CoV-2/FLU/RSV  testing.  Fact Sheet for Patients: PinkCheek.be  Fact Sheet for Healthcare Providers: GravelBags.it  This test is not yet approved or cleared by the [place] FDA and  has been authorized for detection and/or diagnosis of SARS-CoV-2 by  FDA under an Emergency Use Authorization (EUA). This EUA will remain  in effect (meaning this test can be used) for the duration of the  Covid-19 declaration under Section 564(b)(1) of the Act, 21  U.S.C. section 360bbb-3(b)(1), unless the authorization is  terminated or revoked. Performed at [facility], [place] [address] [place], [place] [address]       Radiology Studies: CT Angio Head W or Wo Contrast  Result Date: 09/26/2020 CLINICAL DATA:  Left-sided facial deficits. Left arm and leg weakness. History of brain tumor and seizures. EXAM: CT ANGIOGRAPHY HEAD AND NECK CT PERFUSION BRAIN TECHNIQUE: Multidetector CT imaging of the head and neck was performed using the standard protocol during bolus administration of intravenous contrast. Multiplanar CT image reconstructions and MIPs were obtained to evaluate the vascular anatomy. Carotid stenosis measurements (when applicable) are obtained utilizing NASCET criteria, using the distal internal carotid diameter as the denominator. Multiphase CT imaging of the brain was performed following IV bolus contrast injection. Subsequent parametric perfusion maps were calculated using RAPID software. CONTRAST:  148mL OMNIPAQUE IOHEXOL 350 MG/ML SOLN COMPARISON:  None. FINDINGS: CTA NECK FINDINGS Aortic arch: Standard 3 vessel aortic arch with widely patent arch vessel origins. Right carotid system: Patent without evidence of stenosis, dissection, or significant atherosclerosis. Left carotid system: Patent without evidence of stenosis, dissection,  or significant atherosclerosis. Vertebral arteries: Patent without evidence of stenosis, dissection, or significant atherosclerosis. Mildly dominant right vertebral artery. Mildly limited assessment of the left vertebral artery origin due to motion artifact. Skeleton: Advanced disc degeneration at C4-5, C5-6, and C6-7. Moderate to severe neural foraminal stenosis on the left at C5-6 and on the right at C6-7 due to uncovertebral spurring. Other neck: No evidence of cervical  lymphadenopathy or mass. Upper chest: Mild dependent atelectasis in the lung apices. Review of the MIP images confirms the above findings CTA HEAD FINDINGS Anterior circulation: The internal carotid arteries are patent from skull base to carotid termini with minimal nonstenotic plaque bilaterally. ACAs and MCAs are patent with mild branch vessel irregularity but no evidence of a proximal branch occlusion or flow limiting proximal stenosis. No aneurysm is identified. Posterior circulation: The intracranial vertebral arteries are widely patent to the basilar. Patent left PICA, right AICA, and bilateral SCA origins are identified. The basilar artery is widely patent. There is a fetal origin of the left PCA. Both PCAs are patent with branch vessel irregularity but no evidence of a flow limiting proximal stenosis. There is a severe left P3 branch vessel stenosis. No aneurysm is identified. Venous sinuses: Patent. Anatomic variants: Fetal left PCA. Review of the MIP images confirms the above findings CT Brain Perfusion Findings: ASPECTS: 10 CBF (<30%) Volume: 0 mL Perfusion (Tmax>6.0s) volume: 0 mL Mismatch Volume: 0 mL Infarction Location: N/a IMPRESSION: 1. Intracranial atherosclerosis without large vessel occlusion or flow limiting proximal stenosis. 2. Widely patent cervical carotid and vertebral arteries allowing for motion through the left vertebral origin. 3. No evidence of acute ischemia on CTP. These results were communicated to Dr. Theda Sers at  2:49 pm on 09/26/2020 by text page via the [facility] messaging system. Electronically Signed   By: Logan Bores M.D.   On: 09/26/2020 14:56   CT Angio Neck W and/or Wo Contrast  Result Date: 09/26/2020 CLINICAL DATA:  Left-sided facial deficits. Left arm and leg weakness. History of brain tumor and seizures. EXAM: CT ANGIOGRAPHY HEAD AND NECK CT PERFUSION BRAIN TECHNIQUE: Multidetector CT imaging of the head and neck was performed using the standard protocol during bolus administration of intravenous contrast. Multiplanar CT image reconstructions and MIPs were obtained to evaluate the vascular anatomy. Carotid stenosis measurements (when applicable) are obtained utilizing NASCET criteria, using the distal internal carotid diameter as the denominator. Multiphase CT imaging of the brain was performed following IV bolus contrast injection. Subsequent parametric perfusion maps were calculated using RAPID software. CONTRAST:  161mL OMNIPAQUE IOHEXOL 350 MG/ML SOLN COMPARISON:  None. FINDINGS: CTA NECK FINDINGS Aortic arch: Standard 3 vessel aortic arch with widely patent arch vessel origins. Right carotid system: Patent without evidence of stenosis, dissection, or significant atherosclerosis. Left carotid system: Patent without evidence of stenosis, dissection, or significant atherosclerosis. Vertebral arteries: Patent without evidence of stenosis, dissection, or significant atherosclerosis. Mildly dominant right vertebral artery. Mildly limited assessment of the left vertebral artery origin due to motion artifact. Skeleton: Advanced disc degeneration at C4-5, C5-6, and C6-7. Moderate to severe neural foraminal stenosis on the left at C5-6 and on the right at C6-7 due to uncovertebral spurring. Other neck: No evidence of cervical lymphadenopathy or mass. Upper chest: Mild dependent atelectasis in the lung apices. Review of the MIP images confirms the above findings CTA HEAD FINDINGS Anterior circulation: The internal  carotid arteries are patent from skull base to carotid termini with minimal nonstenotic plaque bilaterally. ACAs and MCAs are patent with mild branch vessel irregularity but no evidence of a proximal branch occlusion or flow limiting proximal stenosis. No aneurysm is identified. Posterior circulation: The intracranial vertebral arteries are widely patent to the basilar. Patent left PICA, right AICA, and bilateral SCA origins are identified. The basilar artery is widely patent. There is a fetal origin of the left PCA. Both PCAs are patent with branch vessel irregularity but no  evidence of a flow limiting proximal stenosis. There is a severe left P3 branch vessel stenosis. No aneurysm is identified. Venous sinuses: Patent. Anatomic variants: Fetal left PCA. Review of the MIP images confirms the above findings CT Brain Perfusion Findings: ASPECTS: 10 CBF (<30%) Volume: 0 mL Perfusion (Tmax>6.0s) volume: 0 mL Mismatch Volume: 0 mL Infarction Location: N/a IMPRESSION: 1. Intracranial atherosclerosis without large vessel occlusion or flow limiting proximal stenosis. 2. Widely patent cervical carotid and vertebral arteries allowing for motion through the left vertebral origin. 3. No evidence of acute ischemia on CTP. These results were communicated to Dr. Theda Sers at 2:49 pm on 09/26/2020 by text page via the [facility] messaging system. Electronically Signed   By: Logan Bores M.D.   On: 09/26/2020 14:56   MR BRAIN WO CONTRAST  Result Date: 09/26/2020 CLINICAL DATA:  Neuro deficit, acute, stroke suspected. Left-sided weakness. EXAM: MRI HEAD WITHOUT CONTRAST TECHNIQUE: Multiplanar, multiecho pulse sequences of the brain and surrounding structures were obtained without intravenous contrast. COMPARISON:  Noncontrast head CT, CT angiogram head/neck and CT perfusion performed earlier the same day 09/26/2020. Brain MRI 06/23/2020. FINDINGS: Brain: 1.9 x 0.5 x 1.2 cm (AP x TV x CC) focus of restricted diffusion extending from  the right corona radiata inferiorly into the right basal ganglia, consistent with acute infarction. Large resection cavity within the left frontal lobe with a small amount of chronic blood products at the periphery. Associated ex vacuo dilatation of the left lateral ventricle. Redemonstrated extensive confluent T2/FLAIR hyperintense signal abnormality throughout the bilateral cerebral white matter. Chronic small vessel ischemic changes within the deep gray nuclei. No non-contrast evidence of intracranial mass. No extra-axial fluid collection. No midline shift. Vascular: Expected proximal arterial flow voids. Skull and upper cervical spine: Left frontoparietal cranioplasty. No focal marrow lesion. Sinuses/Orbits: Visualized orbits show no acute finding. No significant paranasal sinus disease. Trace fluid within the left mastoid air cells. IMPRESSION: 1.9 x 1.2 cm acute infarct extending from the right corona radiata into the right basal ganglia. Chronic findings which are stable as compared to the MRI of 06/23/2020, as follows. Prior left frontoparietal craniotomy with left frontal lobe resection cavity. Extensive T2 hyperintense signal abnormality throughout the cerebral white matter, nonspecific but likely reflecting a combination of post-treatment changes and chronic small vessel ischemic disease. Chronic small vessel ischemic changes are also present within the deep gray nuclei. Electronically Signed   By: Kellie Simmering DO   On: 09/26/2020 18:05   CT CEREBRAL PERFUSION W CONTRAST  Result Date: 09/26/2020 CLINICAL DATA:  Left-sided facial deficits. Left arm and leg weakness. History of brain tumor and seizures. EXAM: CT ANGIOGRAPHY HEAD AND NECK CT PERFUSION BRAIN TECHNIQUE: Multidetector CT imaging of the head and neck was performed using the standard protocol during bolus administration of intravenous contrast. Multiplanar CT image reconstructions and MIPs were obtained to evaluate the vascular anatomy.  Carotid stenosis measurements (when applicable) are obtained utilizing NASCET criteria, using the distal internal carotid diameter as the denominator. Multiphase CT imaging of the brain was performed following IV bolus contrast injection. Subsequent parametric perfusion maps were calculated using RAPID software. CONTRAST:  129mL OMNIPAQUE IOHEXOL 350 MG/ML SOLN COMPARISON:  None. FINDINGS: CTA NECK FINDINGS Aortic arch: Standard 3 vessel aortic arch with widely patent arch vessel origins. Right carotid system: Patent without evidence of stenosis, dissection, or significant atherosclerosis. Left carotid system: Patent without evidence of stenosis, dissection, or significant atherosclerosis. Vertebral arteries: Patent without evidence of stenosis, dissection, or significant atherosclerosis. Mildly dominant  right vertebral artery. Mildly limited assessment of the left vertebral artery origin due to motion artifact. Skeleton: Advanced disc degeneration at C4-5, C5-6, and C6-7. Moderate to severe neural foraminal stenosis on the left at C5-6 and on the right at C6-7 due to uncovertebral spurring. Other neck: No evidence of cervical lymphadenopathy or mass. Upper chest: Mild dependent atelectasis in the lung apices. Review of the MIP images confirms the above findings CTA HEAD FINDINGS Anterior circulation: The internal carotid arteries are patent from skull base to carotid termini with minimal nonstenotic plaque bilaterally. ACAs and MCAs are patent with mild branch vessel irregularity but no evidence of a proximal branch occlusion or flow limiting proximal stenosis. No aneurysm is identified. Posterior circulation: The intracranial vertebral arteries are widely patent to the basilar. Patent left PICA, right AICA, and bilateral SCA origins are identified. The basilar artery is widely patent. There is a fetal origin of the left PCA. Both PCAs are patent with branch vessel irregularity but no evidence of a flow limiting  proximal stenosis. There is a severe left P3 branch vessel stenosis. No aneurysm is identified. Venous sinuses: Patent. Anatomic variants: Fetal left PCA. Review of the MIP images confirms the above findings CT Brain Perfusion Findings: ASPECTS: 10 CBF (<30%) Volume: 0 mL Perfusion (Tmax>6.0s) volume: 0 mL Mismatch Volume: 0 mL Infarction Location: N/a IMPRESSION: 1. Intracranial atherosclerosis without large vessel occlusion or flow limiting proximal stenosis. 2. Widely patent cervical carotid and vertebral arteries allowing for motion through the left vertebral origin. 3. No evidence of acute ischemia on CTP. These results were communicated to Dr. Theda Sers at 2:49 pm on 09/26/2020 by text page via the [facility] messaging system. Electronically Signed   By: Logan Bores M.D.   On: 09/26/2020 14:56   ECHOCARDIOGRAM COMPLETE  Result Date: 09/27/2020    ECHOCARDIOGRAM REPORT   Patient Name:   KYZEN HORN Date of Exam: 09/27/2020 Medical Rec #:  [id number]    Height:       70.0 in Accession #:    [id number]   Weight:       190.9 lb Date of Birth:  Sep 11, 1961   BSA:          2.047 m Patient Age:    56 years     BP:           110/71 mmHg Patient Gender: M            HR:           78 bpm. Exam Location:  Inpatient Procedure: 2D Echo, Cardiac Doppler and Color Doppler Indications:    TIA 435.9 / G45.9  History:        Patient has no prior history of Echocardiogram examinations.  Sonographer:    Bernadene Person RDCS Referring Phys: [id number] [place]  1. Left ventricular ejection fraction, by estimation, is 60 to 65%. The left ventricle has normal function. The left ventricle has no regional wall motion abnormalities. Left ventricular diastolic parameters are indeterminate.  2. Right ventricular systolic function is normal. The right ventricular size is normal. There is normal pulmonary artery systolic pressure.  3. The mitral valve is normal in structure. No evidence of mitral valve regurgitation.  No evidence of mitral stenosis.  4. The aortic valve is normal in structure. Aortic valve regurgitation is not visualized. No aortic stenosis is present.  5. The inferior vena cava is normal in size with greater than 50% respiratory variability, suggesting right atrial pressure of 3  mmHg. FINDINGS  Left Ventricle: Left ventricular ejection fraction, by estimation, is 60 to 65%. The left ventricle has normal function. The left ventricle has no regional wall motion abnormalities. The left ventricular internal cavity size was normal in size. There is  no left ventricular hypertrophy. Left ventricular diastolic parameters are indeterminate. Right Ventricle: The right ventricular size is normal. No increase in right ventricular wall thickness. Right ventricular systolic function is normal. There is normal pulmonary artery systolic pressure. The tricuspid regurgitant velocity is 1.82 m/s, and  with an assumed right atrial pressure of 8 mmHg, the estimated right ventricular systolic pressure is 24.4 mmHg. Left Atrium: Left atrial size was normal in size. Right Atrium: Right atrial size was normal in size. Pericardium: There is no evidence of pericardial effusion. Mitral Valve: The mitral valve is normal in structure. No evidence of mitral valve regurgitation. No evidence of mitral valve stenosis. Tricuspid Valve: The tricuspid valve is normal in structure. Tricuspid valve regurgitation is not demonstrated. No evidence of tricuspid stenosis. Aortic Valve: The aortic valve is normal in structure. Aortic valve regurgitation is not visualized. No aortic stenosis is present. Pulmonic Valve: The pulmonic valve was normal in structure. Pulmonic valve regurgitation is not visualized. No evidence of pulmonic stenosis. Aorta: The aortic root is normal in size and structure. Venous: The inferior vena cava is normal in size with greater than 50% respiratory variability, suggesting right atrial pressure of 3 mmHg. IAS/Shunts: No atrial  level shunt detected by color flow Doppler.  LEFT VENTRICLE PLAX 2D LVIDd:         4.70 cm  Diastology LVIDs:         3.10 cm  LV e' medial:    5.52 cm/s LV PW:         0.90 cm  LV E/e' medial:  9.9 LV IVS:        1.10 cm  LV e' lateral:   8.85 cm/s LVOT diam:     2.20 cm  LV E/e' lateral: 6.2 LV SV:         64 LV SV Index:   31 LVOT Area:     3.80 cm  RIGHT VENTRICLE RV S prime:     18.00 cm/s TAPSE (M-mode): 2.4 cm LEFT ATRIUM             Index       RIGHT ATRIUM           Index LA diam:        3.40 cm 1.66 cm/m  RA Area:     13.80 cm LA Vol (A2C):   33.8 ml 16.52 ml/m RA Volume:   29.90 ml  14.61 ml/m LA Vol (A4C):   42.9 ml 20.96 ml/m LA Biplane Vol: 41.7 ml 20.38 ml/m  AORTIC VALVE LVOT Vmax:   92.00 cm/s LVOT Vmean:  66.600 cm/s LVOT VTI:    0.168 m  AORTA Ao Root diam: 3.50 cm Ao Asc diam:  3.30 cm MITRAL VALVE               TRICUSPID VALVE MV Area (PHT): 2.95 cm    TR Peak grad:   13.2 mmHg MV Decel Time: 257 msec    TR Vmax:        182.00 cm/s MV E velocity: 54.80 cm/s MV A velocity: 57.90 cm/s  SHUNTS MV E/A ratio:  0.95        Systemic VTI:  0.17 m  Systemic Diam: 2.20 cm Sanda Klein MD Electronically signed by Sanda Klein MD Signature Date/Time: 09/27/2020/12:50:34 PM    Final    CT HEAD CODE STROKE WO CONTRAST  Result Date: 09/26/2020 CLINICAL DATA:  Code stroke. Left-sided facial deficits. Left arm and leg weakness. Personal history of brain tumor and seizures. EXAM: CT HEAD WITHOUT CONTRAST TECHNIQUE: Contiguous axial images were obtained from the base of the skull through the vertex without intravenous contrast. COMPARISON:  MR head without and with contrast 06/23/2020 and 04/21/2018. FINDINGS: Brain: Left frontal craniotomy again noted. Surgical cavity evident. Diffuse white matter disease is again seen. Remote lacunar infarct is present in the right basal ganglia. No significant changes present. Insular ribbon is normal bilaterally. Cortex is  unremarkable. Brainstem and cerebellum are with in normal limits. Vascular: No hyperdense vessel or unexpected calcification. Skull: Left frontal craniotomy noted. Calvarium is otherwise intact. No focal lytic or blastic lesions are present. No significant extracranial soft tissue lesion is present. Sinuses/Orbits: The paranasal sinuses and mastoid air cells are clear. The globes and orbits are within normal limits. ASPECTS [facility] Stroke Program Early CT Score) - Ganglionic level infarction (caudate, lentiform nuclei, internal capsule, insula, M1-M3 cortex): 7/7 - Supraganglionic infarction (M4-M6 cortex): 3/3 Total score (0-10 with 10 being normal): 10/10 IMPRESSION: 1. No acute intracranial abnormality or significant interval change. 2. Stable atrophy and white matter disease. 3. Left frontal craniotomy with resection cavity. 4. Aspects 10/10 5. The above was relayed via text pager to Dr. Theda Sers on 09/26/2020 at 14:32 . Electronically Signed   By: San Morelle M.D.   On: 09/26/2020 14:33    Scheduled Meds: .  stroke: mapping our early stages of recovery book   Does not apply Once  . aspirin EC  81 mg Oral Daily  . atorvastatin  40 mg Oral Daily  . clopidogrel  75 mg Oral Daily  . docusate sodium  100 mg Oral BID  . escitalopram  10 mg Oral Daily  . famotidine  20 mg Oral Daily  . lamoTRIgine  225 mg Oral Daily  . lamoTRIgine  300 mg Oral QHS  . levothyroxine  112 mcg Oral QHS  . multivitamin with minerals  1 tablet Oral Daily   Continuous Infusions:   LOS: 2 days   Time spent: 30 minutes   Darliss Cheney, MD [facility]  09/28/2020, 11:34 AM   To contact the attending provider between 7A-7P or the covering provider during after hours 7P-7A, please log into the web site [email or web address].

## 2020-09-28 NOTE — Consult Note (Signed)
Physical Medicine and Rehabilitation Consult Reason for Consult: Left side weakness and facial droop Referring Physician: [facility]  HPI: Howard White is a 59 y.o. 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  Per chart review patient lives with elderly parents.  Reportedly independent prior to admission.  Patient with bed and bath on main level.  Presented 09/26/2020 with left-sided weakness and facial droop.  Cranial CT scan showed no acute intracranial abnormality or significant interval change.  Left frontal craniotomy with resection cavity noted.  CT angiogram of head and neck without large vessel occlusion or limiting proximal stenosis.  MRI of the brain showed a 1.9 x 1 2.  Centimeter acute infarct extending from the right corona radiata into the right basal ganglia.  Echocardiogram with ejection fraction of 60 to 65% no wall motion abnormalities.  Admission chemistries unremarkable except potassium 5.5 glucose 101.  Currently maintained on aspirin and Plavix for CVA prophylaxis.  Tolerating a regular diet.  Therapy evaluations completed with recommendations of physical medicine rehab consult.  Review of Systems  Constitutional: Negative for chills and fever.  HENT: Negative for hearing loss.   Eyes: Negative for blurred vision and double vision.  Respiratory: Negative for cough and shortness of breath.   Cardiovascular: Negative for chest pain and palpitations.  Gastrointestinal: Positive for constipation. Negative for heartburn, nausea and vomiting.  Genitourinary: Negative for dysuria, flank pain and hematuria.  Musculoskeletal: Positive for joint pain and myalgias.  Skin: Negative for rash.  Neurological: Positive for weakness.  Psychiatric/Behavioral: Positive for depression. The patient has insomnia.   All other systems  reviewed and are negative.  Past Medical History:  Diagnosis Date  . Brain cancer ([place])   . Seizures ([place])   . Thyroid disease    Hypothyroid   Past Surgical History:  Procedure Laterality Date  . brain cancer    . BRAIN SURGERY     Family History  Problem Relation Age of Onset  . Brain cancer Unknown   . Thyroid disease Unknown   . Cancer Unknown   . Diabetes Unknown   . Stroke Unknown    Social History:  reports that he has never smoked. He has never used smokeless tobacco. He reports current alcohol use. He reports that he does not use drugs. Allergies:  Allergies  Allergen Reactions  . Lactose Intolerance (Gi)    Medications Prior to Admission  Medication Sig Dispense Refill  . acetaminophen (TYLENOL) 500 MG tablet Take 1,000 mg by mouth every 6 (six) hours as needed for mild pain.    Marland Kitchen escitalopram (LEXAPRO) 10 MG tablet Take 10 mg by mouth daily.    . famotidine (PEPCID) 20 MG tablet Take 20 mg by mouth daily.    . fluticasone (FLONASE) 50 MCG/ACT nasal spray Place 2 sprays into both nostrils daily as needed for allergies.     Marland Kitchen lamoTRIgine (LAMICTAL) 150 MG tablet TAKE 1 AND 1/2 TABS EVERY MORNING AND 2 TAB EVERY EVENING (Patient taking differently: Take 225-300 mg by mouth See admin instructions. TAKE 1 AND 1/2 TABS ( 225mg )  EVERY MORNING AND 2 TAB (300mg ) EVERY EVENING) 315 tablet 3  . Multiple Vitamins-Minerals (MULTIVITAMIN WITH MINERALS) tablet Take 1 tablet by mouth daily.    Marland Kitchen SYNTHROID 112 MCG tablet Take 112 mcg by mouth at bedtime.  Home: Home Living Family/patient expects to be discharged to:: Private residence Living Arrangements: Parent (parents) Available Help at Discharge: Family, Available 24 hours/day Type of Home: House Home Access: Level entry Home Layout: Two level, Able to live on main level with bedroom/bathroom Alternate Level Stairs-Number of Steps: flight Alternate Level Stairs-Rails: Right Bathroom Shower/Tub: Clinical cytogeneticist: Standard Home Equipment: Hand held shower head  Lives With: Family  Functional History: Prior Function Level of Independence: Independent Comments: retired due to Animal nutritionist, indep with ADLs Functional Status:  Mobility: Bed Mobility Overal bed mobility: Needs Assistance Bed Mobility: Supine to Sit, Sit to Supine Supine to sit: HOB elevated, Max assist Sit to supine: Mod assist General bed mobility comments: assist for LLE and trunk elevation with VCs for sequencing through task, assist to scoot hips towards EOB; assist to support trunk when returning to supine, pt able to pull himself towards [facility] with minA with directional cues/instruction  Transfers Overall transfer level: Needs assistance Equipment used:  (front to front transfer with gait belt) Transfers: Sit to/from Stand, Stand Pivot Transfers Sit to Stand: Mod assist Stand pivot transfers: Max assist General transfer comment: requires use of momentum with assist to power up to stand. VCs for upright posture. pt already had been OOB to recliner and recently back to bed so deferred further transfer attempts  Ambulation/Gait General Gait Details: able to transfer to chair, deferred amb due to needing second person for safety    ADL: ADL Overall ADL's : Needs assistance/impaired Eating/Feeding: Minimal assistance, Sitting Grooming: Min guard, Sitting, Wash/dry face Grooming Details (indicate cue type and reason): seated EOB Upper Body Bathing: Minimal assistance, Sitting Lower Body Bathing: Maximal assistance, Sit to/from stand Upper Body Dressing : Moderate assistance, Sitting Lower Body Dressing: Maximal assistance, Sit to/from stand Lower Body Dressing Details (indicate cue type and reason): modA for sit<>stand  Toileting- Clothing Manipulation and Hygiene: Total assistance, Sit to/from stand Functional mobility during ADLs: Moderate assistance (sit<>stand  )  Cognition: Cognition Overall Cognitive Status: Impaired/Different from baseline Arousal/Alertness: Awake/alert Orientation Level: Oriented to person, Oriented to place, Oriented to situation, Oriented to time Attention: Focused, Sustained Focused Attention: Appears intact Sustained Attention: Appears intact Memory: Impaired Memory Impairment: Decreased short term memory Decreased Short Term Memory: Verbal basic Awareness: Appears intact Problem Solving: Impaired Problem Solving Impairment: Verbal basic, Verbal complex, Functional basic, Functional complex Executive Function: Reasoning, Organizing Reasoning: Impaired Reasoning Impairment: Verbal complex Organizing: Impaired Organizing Impairment: Verbal complex Safety/Judgment: Appears intact Cognition Arousal/Alertness: Awake/alert Behavior During Therapy: WFL for tasks assessed/performed, Impulsive Overall Cognitive Status: Impaired/Different from baseline Area of Impairment: Problem solving, Memory, Following commands, Safety/judgement, Awareness, Attention Current Attention Level: Selective Memory: Decreased short-term memory Following Commands: Follows one step commands with increased time Safety/Judgement: Decreased awareness of safety, Decreased awareness of deficits Awareness: Emergent Problem Solving: Slow processing, Difficulty sequencing, Requires verbal cues, Requires tactile cues General Comments: pt mildly impulsive and with difficulty sequencing/motor planning mobility tasks, requires cues to do so and for safety; pt repeating some questions asked during session and unaware (how long OT had been working for)   Blood pressure 125/78, pulse 69, temperature 98.5 F (36.9 C), temperature source Oral, resp. rate 19, height 5\' 10"  (1.778 m), weight 86.6 kg, SpO2 95 %. Physical Exam General: Alert, No apparent distress HEENT: Head is normocephalic, left facial droop  Neck: Supple without JVD or  lymphadenopathy Heart: Reg rate and rhythm. No murmurs rubs or gallops Chest: CTA bilaterally without wheezes, rales, or rhonchi; no  distress Abdomen: Soft, non-tender, non-distended, bowel sounds positive. Extremities: No clubbing, cyanosis, or edema. Pulses are 2+ Skin: Clean and intact without signs of breakdown Neuro: Patient is alert in no acute distress.  Follows commands.  Oriented x3.  LUE: 3/5 SA, EE, 4/5 hand grip LLE: 3/5 HF, KE, 4/5 DF, PF Psych: Pt's affect is appropriate. Pt is cooperative  No results found for this or any previous visit (from the past 24 hour(s)). CT Angio Head W or Wo Contrast  Result Date: 09/26/2020 CLINICAL DATA:  Left-sided facial deficits. Left arm and leg weakness. History of brain tumor and seizures. EXAM: CT ANGIOGRAPHY HEAD AND NECK CT PERFUSION BRAIN TECHNIQUE: Multidetector CT imaging of the head and neck was performed using the standard protocol during bolus administration of intravenous contrast. Multiplanar CT image reconstructions and MIPs were obtained to evaluate the vascular anatomy. Carotid stenosis measurements (when applicable) are obtained utilizing NASCET criteria, using the distal internal carotid diameter as the denominator. Multiphase CT imaging of the brain was performed following IV bolus contrast injection. Subsequent parametric perfusion maps were calculated using RAPID software. CONTRAST:  131mL OMNIPAQUE IOHEXOL 350 MG/ML SOLN COMPARISON:  None. FINDINGS: CTA NECK FINDINGS Aortic arch: Standard 3 vessel aortic arch with widely patent arch vessel origins. Right carotid system: Patent without evidence of stenosis, dissection, or significant atherosclerosis. Left carotid system: Patent without evidence of stenosis, dissection, or significant atherosclerosis. Vertebral arteries: Patent without evidence of stenosis, dissection, or significant atherosclerosis. Mildly dominant right vertebral artery. Mildly limited assessment of the left  vertebral artery origin due to motion artifact. Skeleton: Advanced disc degeneration at C4-5, C5-6, and C6-7. Moderate to severe neural foraminal stenosis on the left at C5-6 and on the right at C6-7 due to uncovertebral spurring. Other neck: No evidence of cervical lymphadenopathy or mass. Upper chest: Mild dependent atelectasis in the lung apices. Review of the MIP images confirms the above findings CTA HEAD FINDINGS Anterior circulation: The internal carotid arteries are patent from skull base to carotid termini with minimal nonstenotic plaque bilaterally. ACAs and MCAs are patent with mild branch vessel irregularity but no evidence of a proximal branch occlusion or flow limiting proximal stenosis. No aneurysm is identified. Posterior circulation: The intracranial vertebral arteries are widely patent to the basilar. Patent left PICA, right AICA, and bilateral SCA origins are identified. The basilar artery is widely patent. There is a fetal origin of the left PCA. Both PCAs are patent with branch vessel irregularity but no evidence of a flow limiting proximal stenosis. There is a severe left P3 branch vessel stenosis. No aneurysm is identified. Venous sinuses: Patent. Anatomic variants: Fetal left PCA. Review of the MIP images confirms the above findings CT Brain Perfusion Findings: ASPECTS: 10 CBF (<30%) Volume: 0 mL Perfusion (Tmax>6.0s) volume: 0 mL Mismatch Volume: 0 mL Infarction Location: N/a IMPRESSION: 1. Intracranial atherosclerosis without large vessel occlusion or flow limiting proximal stenosis. 2. Widely patent cervical carotid and vertebral arteries allowing for motion through the left vertebral origin. 3. No evidence of acute ischemia on CTP. These results were communicated to Dr. Theda Sers at 2:49 pm on 09/26/2020 by text page via the [facility] messaging system. Electronically Signed   By: Logan Bores M.D.   On: 09/26/2020 14:56   CT Angio Neck W and/or Wo Contrast  Result Date: 09/26/2020 CLINICAL  DATA:  Left-sided facial deficits. Left arm and leg weakness. History of brain tumor and seizures. EXAM: CT ANGIOGRAPHY HEAD AND NECK CT PERFUSION BRAIN TECHNIQUE: Multidetector CT imaging  of the head and neck was performed using the standard protocol during bolus administration of intravenous contrast. Multiplanar CT image reconstructions and MIPs were obtained to evaluate the vascular anatomy. Carotid stenosis measurements (when applicable) are obtained utilizing NASCET criteria, using the distal internal carotid diameter as the denominator. Multiphase CT imaging of the brain was performed following IV bolus contrast injection. Subsequent parametric perfusion maps were calculated using RAPID software. CONTRAST:  180mL OMNIPAQUE IOHEXOL 350 MG/ML SOLN COMPARISON:  None. FINDINGS: CTA NECK FINDINGS Aortic arch: Standard 3 vessel aortic arch with widely patent arch vessel origins. Right carotid system: Patent without evidence of stenosis, dissection, or significant atherosclerosis. Left carotid system: Patent without evidence of stenosis, dissection, or significant atherosclerosis. Vertebral arteries: Patent without evidence of stenosis, dissection, or significant atherosclerosis. Mildly dominant right vertebral artery. Mildly limited assessment of the left vertebral artery origin due to motion artifact. Skeleton: Advanced disc degeneration at C4-5, C5-6, and C6-7. Moderate to severe neural foraminal stenosis on the left at C5-6 and on the right at C6-7 due to uncovertebral spurring. Other neck: No evidence of cervical lymphadenopathy or mass. Upper chest: Mild dependent atelectasis in the lung apices. Review of the MIP images confirms the above findings CTA HEAD FINDINGS Anterior circulation: The internal carotid arteries are patent from skull base to carotid termini with minimal nonstenotic plaque bilaterally. ACAs and MCAs are patent with mild branch vessel irregularity but no evidence of a proximal branch  occlusion or flow limiting proximal stenosis. No aneurysm is identified. Posterior circulation: The intracranial vertebral arteries are widely patent to the basilar. Patent left PICA, right AICA, and bilateral SCA origins are identified. The basilar artery is widely patent. There is a fetal origin of the left PCA. Both PCAs are patent with branch vessel irregularity but no evidence of a flow limiting proximal stenosis. There is a severe left P3 branch vessel stenosis. No aneurysm is identified. Venous sinuses: Patent. Anatomic variants: Fetal left PCA. Review of the MIP images confirms the above findings CT Brain Perfusion Findings: ASPECTS: 10 CBF (<30%) Volume: 0 mL Perfusion (Tmax>6.0s) volume: 0 mL Mismatch Volume: 0 mL Infarction Location: N/a IMPRESSION: 1. Intracranial atherosclerosis without large vessel occlusion or flow limiting proximal stenosis. 2. Widely patent cervical carotid and vertebral arteries allowing for motion through the left vertebral origin. 3. No evidence of acute ischemia on CTP. These results were communicated to Dr. Theda Sers at 2:49 pm on 09/26/2020 by text page via the [facility] messaging system. Electronically Signed   By: Logan Bores M.D.   On: 09/26/2020 14:56   MR BRAIN WO CONTRAST  Result Date: 09/26/2020 CLINICAL DATA:  Neuro deficit, acute, stroke suspected. Left-sided weakness. EXAM: MRI HEAD WITHOUT CONTRAST TECHNIQUE: Multiplanar, multiecho pulse sequences of the brain and surrounding structures were obtained without intravenous contrast. COMPARISON:  Noncontrast head CT, CT angiogram head/neck and CT perfusion performed earlier the same day 09/26/2020. Brain MRI 06/23/2020. FINDINGS: Brain: 1.9 x 0.5 x 1.2 cm (AP x TV x CC) focus of restricted diffusion extending from the right corona radiata inferiorly into the right basal ganglia, consistent with acute infarction. Large resection cavity within the left frontal lobe with a small amount of chronic blood products at the  periphery. Associated ex vacuo dilatation of the left lateral ventricle. Redemonstrated extensive confluent T2/FLAIR hyperintense signal abnormality throughout the bilateral cerebral white matter. Chronic small vessel ischemic changes within the deep gray nuclei. No non-contrast evidence of intracranial mass. No extra-axial fluid collection. No midline shift. Vascular: Expected proximal  arterial flow voids. Skull and upper cervical spine: Left frontoparietal cranioplasty. No focal marrow lesion. Sinuses/Orbits: Visualized orbits show no acute finding. No significant paranasal sinus disease. Trace fluid within the left mastoid air cells. IMPRESSION: 1.9 x 1.2 cm acute infarct extending from the right corona radiata into the right basal ganglia. Chronic findings which are stable as compared to the MRI of 06/23/2020, as follows. Prior left frontoparietal craniotomy with left frontal lobe resection cavity. Extensive T2 hyperintense signal abnormality throughout the cerebral white matter, nonspecific but likely reflecting a combination of post-treatment changes and chronic small vessel ischemic disease. Chronic small vessel ischemic changes are also present within the deep gray nuclei. Electronically Signed   By: Kellie Simmering DO   On: 09/26/2020 18:05   CT CEREBRAL PERFUSION W CONTRAST  Result Date: 09/26/2020 CLINICAL DATA:  Left-sided facial deficits. Left arm and leg weakness. History of brain tumor and seizures. EXAM: CT ANGIOGRAPHY HEAD AND NECK CT PERFUSION BRAIN TECHNIQUE: Multidetector CT imaging of the head and neck was performed using the standard protocol during bolus administration of intravenous contrast. Multiplanar CT image reconstructions and MIPs were obtained to evaluate the vascular anatomy. Carotid stenosis measurements (when applicable) are obtained utilizing NASCET criteria, using the distal internal carotid diameter as the denominator. Multiphase CT imaging of the brain was performed following  IV bolus contrast injection. Subsequent parametric perfusion maps were calculated using RAPID software. CONTRAST:  154mL OMNIPAQUE IOHEXOL 350 MG/ML SOLN COMPARISON:  None. FINDINGS: CTA NECK FINDINGS Aortic arch: Standard 3 vessel aortic arch with widely patent arch vessel origins. Right carotid system: Patent without evidence of stenosis, dissection, or significant atherosclerosis. Left carotid system: Patent without evidence of stenosis, dissection, or significant atherosclerosis. Vertebral arteries: Patent without evidence of stenosis, dissection, or significant atherosclerosis. Mildly dominant right vertebral artery. Mildly limited assessment of the left vertebral artery origin due to motion artifact. Skeleton: Advanced disc degeneration at C4-5, C5-6, and C6-7. Moderate to severe neural foraminal stenosis on the left at C5-6 and on the right at C6-7 due to uncovertebral spurring. Other neck: No evidence of cervical lymphadenopathy or mass. Upper chest: Mild dependent atelectasis in the lung apices. Review of the MIP images confirms the above findings CTA HEAD FINDINGS Anterior circulation: The internal carotid arteries are patent from skull base to carotid termini with minimal nonstenotic plaque bilaterally. ACAs and MCAs are patent with mild branch vessel irregularity but no evidence of a proximal branch occlusion or flow limiting proximal stenosis. No aneurysm is identified. Posterior circulation: The intracranial vertebral arteries are widely patent to the basilar. Patent left PICA, right AICA, and bilateral SCA origins are identified. The basilar artery is widely patent. There is a fetal origin of the left PCA. Both PCAs are patent with branch vessel irregularity but no evidence of a flow limiting proximal stenosis. There is a severe left P3 branch vessel stenosis. No aneurysm is identified. Venous sinuses: Patent. Anatomic variants: Fetal left PCA. Review of the MIP images confirms the above findings CT  Brain Perfusion Findings: ASPECTS: 10 CBF (<30%) Volume: 0 mL Perfusion (Tmax>6.0s) volume: 0 mL Mismatch Volume: 0 mL Infarction Location: N/a IMPRESSION: 1. Intracranial atherosclerosis without large vessel occlusion or flow limiting proximal stenosis. 2. Widely patent cervical carotid and vertebral arteries allowing for motion through the left vertebral origin. 3. No evidence of acute ischemia on CTP. These results were communicated to Dr. Theda Sers at 2:49 pm on 09/26/2020 by text page via the [facility] messaging system. Electronically Signed   By:  Logan Bores M.D.   On: 09/26/2020 14:56   ECHOCARDIOGRAM COMPLETE  Result Date: 09/27/2020    ECHOCARDIOGRAM REPORT   Patient Name:   Howard White Date of Exam: 09/27/2020 Medical Rec #:  [id number]    Height:       70.0 in Accession #:    [id number]   Weight:       190.9 lb Date of Birth:  [birth year]   BSA:          2.047 m Patient Age:    38 years     BP:           110/71 mmHg Patient Gender: M            HR:           78 bpm. Exam Location:  Inpatient Procedure: 2D Echo, Cardiac Doppler and Color Doppler Indications:    TIA 435.9 / G45.9  History:        Patient has no prior history of Echocardiogram examinations.  Sonographer:    Bernadene Person RDCS Referring Phys: [id number] [place]  1. Left ventricular ejection fraction, by estimation, is 60 to 65%. The left ventricle has normal function. The left ventricle has no regional wall motion abnormalities. Left ventricular diastolic parameters are indeterminate.  2. Right ventricular systolic function is normal. The right ventricular size is normal. There is normal pulmonary artery systolic pressure.  3. The mitral valve is normal in structure. No evidence of mitral valve regurgitation. No evidence of mitral stenosis.  4. The aortic valve is normal in structure. Aortic valve regurgitation is not visualized. No aortic stenosis is present.  5. The inferior vena cava is normal in size with greater  than 50% respiratory variability, suggesting right atrial pressure of 3 mmHg. FINDINGS  Left Ventricle: Left ventricular ejection fraction, by estimation, is 60 to 65%. The left ventricle has normal function. The left ventricle has no regional wall motion abnormalities. The left ventricular internal cavity size was normal in size. There is  no left ventricular hypertrophy. Left ventricular diastolic parameters are indeterminate. Right Ventricle: The right ventricular size is normal. No increase in right ventricular wall thickness. Right ventricular systolic function is normal. There is normal pulmonary artery systolic pressure. The tricuspid regurgitant velocity is 1.82 m/s, and  with an assumed right atrial pressure of 8 mmHg, the estimated right ventricular systolic pressure is 62.8 mmHg. Left Atrium: Left atrial size was normal in size. Right Atrium: Right atrial size was normal in size. Pericardium: There is no evidence of pericardial effusion. Mitral Valve: The mitral valve is normal in structure. No evidence of mitral valve regurgitation. No evidence of mitral valve stenosis. Tricuspid Valve: The tricuspid valve is normal in structure. Tricuspid valve regurgitation is not demonstrated. No evidence of tricuspid stenosis. Aortic Valve: The aortic valve is normal in structure. Aortic valve regurgitation is not visualized. No aortic stenosis is present. Pulmonic Valve: The pulmonic valve was normal in structure. Pulmonic valve regurgitation is not visualized. No evidence of pulmonic stenosis. Aorta: The aortic root is normal in size and structure. Venous: The inferior vena cava is normal in size with greater than 50% respiratory variability, suggesting right atrial pressure of 3 mmHg. IAS/Shunts: No atrial level shunt detected by color flow Doppler.  LEFT VENTRICLE PLAX 2D LVIDd:         4.70 cm  Diastology LVIDs:         3.10 cm  LV e' medial:  5.52 cm/s LV PW:         0.90 cm  LV E/e' medial:  9.9 LV IVS:         1.10 cm  LV e' lateral:   8.85 cm/s LVOT diam:     2.20 cm  LV E/e' lateral: 6.2 LV SV:         64 LV SV Index:   31 LVOT Area:     3.80 cm  RIGHT VENTRICLE RV S prime:     18.00 cm/s TAPSE (M-mode): 2.4 cm LEFT ATRIUM             Index       RIGHT ATRIUM           Index LA diam:        3.40 cm 1.66 cm/m  RA Area:     13.80 cm LA Vol (A2C):   33.8 ml 16.52 ml/m RA Volume:   29.90 ml  14.61 ml/m LA Vol (A4C):   42.9 ml 20.96 ml/m LA Biplane Vol: 41.7 ml 20.38 ml/m  AORTIC VALVE LVOT Vmax:   92.00 cm/s LVOT Vmean:  66.600 cm/s LVOT VTI:    0.168 m  AORTA Ao Root diam: 3.50 cm Ao Asc diam:  3.30 cm MITRAL VALVE               TRICUSPID VALVE MV Area (PHT): 2.95 cm    TR Peak grad:   13.2 mmHg MV Decel Time: 257 msec    TR Vmax:        182.00 cm/s MV E velocity: 54.80 cm/s MV A velocity: 57.90 cm/s  SHUNTS MV E/A ratio:  0.95        Systemic VTI:  0.17 m                            Systemic Diam: 2.20 cm Dani Gobble Croitoru MD Electronically signed by Sanda Klein MD Signature Date/Time: 09/27/2020/12:50:34 PM    Final    CT HEAD CODE STROKE WO CONTRAST  Result Date: 09/26/2020 CLINICAL DATA:  Code stroke. Left-sided facial deficits. Left arm and leg weakness. Personal history of brain tumor and seizures. EXAM: CT HEAD WITHOUT CONTRAST TECHNIQUE: Contiguous axial images were obtained from the base of the skull through the vertex without intravenous contrast. COMPARISON:  MR head without and with contrast 06/23/2020 and 04/21/2018. FINDINGS: Brain: Left frontal craniotomy again noted. Surgical cavity evident. Diffuse white matter disease is again seen. Remote lacunar infarct is present in the right basal ganglia. No significant changes present. Insular ribbon is normal bilaterally. Cortex is unremarkable. Brainstem and cerebellum are with in normal limits. Vascular: No hyperdense vessel or unexpected calcification. Skull: Left frontal craniotomy noted. Calvarium is otherwise intact. No focal lytic or blastic  lesions are present. No significant extracranial soft tissue lesion is present. Sinuses/Orbits: The paranasal sinuses and mastoid air cells are clear. The globes and orbits are within normal limits. ASPECTS [facility] Stroke Program Early CT Score) - Ganglionic level infarction (caudate, lentiform nuclei, internal capsule, insula, M1-M3 cortex): 7/7 - Supraganglionic infarction (M4-M6 cortex): 3/3 Total score (0-10 with 10 being normal): 10/10 IMPRESSION: 1. No acute intracranial abnormality or significant interval change. 2. Stable atrophy and white matter disease. 3. Left frontal craniotomy with resection cavity. 4. Aspects 10/10 5. The above was relayed via text pager to Dr. Theda Sers on 09/26/2020 at 14:32 . Electronically Signed   By: Wynetta Fines.D.  On: 09/26/2020 14:33    Assessment/Plan: Diagnosis: right basal ganglia/corona radiata infarct 1. Does the need for close, 24 hr/day medical supervision in concert with the patient's rehab needs make it unreasonable for this patient to be served in a less intensive setting? Yes 2. 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 Due to bladder management, bowel management, safety, skin/wound care, disease management, medication administration, pain management and patient education, does the patient require 24 hr/day rehab nursing? Yes 4. Does the patient require coordinated care of a physician, rehab nurse, therapy disciplines of PT, OT, SLP to address physical and functional deficits in the context of the above medical diagnosis(es)? Yes Addressing deficits in the following areas: balance, endurance, locomotion, strength, transferring, bowel/bladder control, bathing, dressing, feeding, grooming, toileting, speech and psychosocial support 5. Can the patient actively participate in an intensive  therapy program of at least 3 hrs of therapy per day at least 5 days per week? Yes 6. The potential for patient to make measurable gains while on inpatient rehab is excellent 7. Anticipated functional outcomes upon discharge from inpatient rehab are modified independent  with PT, modified independent with OT, independent with SLP. 8. Estimated rehab length of stay to reach the above functional goals is: 12-16 days 9. Anticipated discharge destination: Home 10. Overall Rehab/Functional Prognosis: excellent  RECOMMENDATIONS: This patient's condition is appropriate for continued rehabilitative care in the following setting: CIR Patient has agreed to participate in recommended program. Yes Note that insurance prior authorization may be required for reimbursement for recommended care.  Comment: Thank you for this consult. Admission coordinator to follow.   I have personally performed a face to face diagnostic evaluation, including, but not limited to relevant history and physical exam findings, of this patient and developed relevant assessment and plan.  Additionally, I have reviewed and concur with the physician assistant's documentation above.  Leeroy Cha, MD  Lavon Paganini Essex, PA-C 09/28/2020

## 2020-09-29 DIAGNOSIS — I63311 Cerebral infarction due to thrombosis of right middle cerebral artery: Secondary | ICD-10-CM

## 2020-09-29 NOTE — Progress Notes (Signed)
CSW spoke with pt regarding option of pursuing CIR admission at another facility.  Pt would prefer to wait for bed at [facility] CIR. Lurline Idol, MSW, LCSW 11/15/20213:25 PM

## 2020-09-29 NOTE — Progress Notes (Signed)
Occupational Therapy Treatment Patient Details Name: Howard White MRN: [id number] DOB: [birth year] Today's Date: 09/29/2020    History of present illness Howard White is a 59 y.o. male with medical history significant of brain cancer, seizures and hypothyroidism.  Patient has been at his usual state of health until this afternoon around 1:30 PM when he noticed left sided weakness and left facial droop. MRI revealed acute infarct extending from R corona radiata into R basal ganglia.    OT comments  Patient supine in bed and agreeable to OT session. He completes LB dressing (donning socks) in bed with max assist, bed mobility with min-mod assist, and engaged in L UE exercises at EOB with supervision to maintain balance and AAROM for increased ROM/coordination of LUE.  Patient requires cueing for sequencing, motor planning and problem solving throughout session, is repetitive with questions and requires cueing for pacing (mildly impulsive).  Encouraged patient to utilize L UE as able, complete shoulder elevation, retraction and hand extension exercises throughout the day. Will follow acutely, CIR remains appropriate pt is highly motivated.     Follow Up Recommendations  CIR    Equipment Recommendations  3 in 1 bedside commode;Other (comment) (TBD)    Recommendations for Other Services Rehab consult    Precautions / Restrictions Precautions Precautions: Fall Restrictions Weight Bearing Restrictions: No       Mobility Bed Mobility Overal bed mobility: Needs Assistance Bed Mobility: Supine to Sit;Sit to Supine     Supine to sit: HOB elevated;Min assist Sit to supine: Mod assist   General bed mobility comments: require cueing for technique and seqencing, overall min assist using rail to elevate trunk, returned to supine with mod assist for LB mgmt and sequencing (pt with decreased awareness/coordination of L side trasitioning back to bed)   Transfers                       Balance Overall balance assessment: Needs assistance Sitting-balance support: No upper extremity supported;Feet supported Sitting balance-Leahy Scale: Fair Sitting balance - Comments: min guard to close supervision for safety                                    ADL either performed or assessed with clinical judgement   ADL Overall ADL's : Needs assistance/impaired                     Lower Body Dressing: Maximal assistance;Sitting/lateral leans;Bed level Lower Body Dressing Details (indicate cue type and reason): donning socks in bed today, able to don R sock, cueing for technique for L sock with max assist                General ADL Comments: focused on L UE exercises today      Vision   Additional Comments: further assessment required   Perception     Praxis      Cognition Arousal/Alertness: Awake/alert Behavior During Therapy: WFL for tasks assessed/performed;Impulsive Overall Cognitive Status: Impaired/Different from baseline Area of Impairment: Problem solving;Memory;Following commands;Safety/judgement;Awareness;Attention                   Current Attention Level: Selective Memory: Decreased short-term memory Following Commands: Follows one step commands with increased time;Follows multi-step commands inconsistently Safety/Judgement: Decreased awareness of safety;Decreased awareness of deficits Awareness: Emergent Problem Solving: Slow processing;Difficulty sequencing;Requires verbal cues;Requires tactile cues General Comments: pt mildly impulsive, moves  fast and requires cueing for pacing; poor motor planning and sequencing, repititve during session         Exercises Exercises: General Upper Extremity;Other exercises General Exercises - Upper Extremity Shoulder Flexion: AAROM;5 reps;Left;Seated Shoulder Extension: AAROM;Left;5 reps;Seated Elbow Flexion: AAROM;Left;5 reps;Seated Elbow Extension: AAROM;Left;5 reps;Seated Wrist  Flexion: AAROM;Left;5 reps;Seated Wrist Extension: AAROM;Left;5 reps;Seated Digit Composite Flexion: AAROM;Left;5 reps;Seated Composite Extension: AAROM;Left;5 reps;Seated Other Exercises Other Exercises: AAROM to AROM of shoulder elevation, retraction x 5 reps seated and supine; cueing for posture to avoid forward head    Shoulder Instructions       General Comments VSS     Pertinent Vitals/ Pain       Pain Assessment: No/denies pain  Home Living                                          Prior Functioning/Environment              Frequency  Min 2X/week        Progress Toward Goals  OT Goals(current goals can now be found in the care plan section)  Progress towards OT goals: Progressing toward goals  Acute Rehab OT Goals Patient Stated Goal: to get better OT Goal Formulation: With patient  Plan Discharge plan remains appropriate;Frequency remains appropriate    Co-evaluation                 AM-PAC OT "6 Clicks" Daily Activity     Outcome Measure   Help from another person eating meals?: A Little Help from another person taking care of personal grooming?: A Lot Help from another person toileting, which includes using toliet, bedpan, or urinal?: Total Help from another person bathing (including washing, rinsing, drying)?: A Lot Help from another person to put on and taking off regular upper body clothing?: A Lot Help from another person to put on and taking off regular lower body clothing?: A Lot 6 Click Score: 12    End of Session    OT Visit Diagnosis: Hemiplegia and hemiparesis;Other symptoms and signs involving cognitive function;Other abnormalities of gait and mobility (R26.89) Hemiplegia - Right/Left: Left Hemiplegia - dominant/non-dominant: Non-Dominant Hemiplegia - caused by: Cerebral infarction   Activity Tolerance Patient tolerated treatment well   Patient Left in bed;with call bell/phone within reach;with bed alarm  set   Nurse Communication Mobility status        Time: 7412-8786 OT Time Calculation (min): 28 min  Charges: OT General Charges $OT Visit: 1 Visit OT Treatments $Self Care/Home Management : 8-22 mins $Neuromuscular Re-education: 8-22 mins  Jolaine Artist, OT [place] Pager [phone] Office [phone]    Delight Stare 09/29/2020, 11:04 AM

## 2020-09-29 NOTE — Progress Notes (Signed)
Physical Therapy Treatment Patient Details Name: Howard White MRN: [id number] DOB: [birth year] Today's Date: 09/29/2020    History of Present Illness Shamere Campas is a 60 y.o. male with medical history significant of brain cancer, seizures and hypothyroidism.  Patient has been at his usual state of health until this afternoon around 1:30 PM when he noticed left sided weakness and left facial droop. MRI revealed acute infarct extending from R corona radiata into R basal ganglia.     PT Comments    Pt tolerated >45 mins of therapy today with primary focus sitting and standing in midline while controlling significant weight shift during sit to stand transitions.  He has some good workable tone in the left leg and arm and would benefit from trying a L platform RW for first attempts at gait with a second person to assist due to impulsivity and lean.  He is a very Scientist, research (physical sciences) and will do well if he can go to CIR at discharge.  PT will continue to follow acutely for safe mobility progression  Follow Up Recommendations  CIR     Equipment Recommendations  Rolling walker with 5" wheels;3in1 (PT);Other (comment) (L platform RW)    Recommendations for Other Services       Precautions / Restrictions Precautions Precautions: Fall Precaution Comments: [name]    Mobility  Bed Mobility Overal bed mobility: Needs Assistance Bed Mobility: Supine to Sit     Supine to sit: Mod assist;HOB elevated     General bed mobility comments: Mod assist to prevent posterior LOB during transition to sitting.  Pt moving very quickly grasping at bed, rails, therapist to come up to sitting with his right hand.    Transfers Overall transfer level: Needs assistance Equipment used: None Transfers: Sit to/from [employer] Sit to Stand: Mod assist   Squat pivot transfers: Mod assist     General transfer comment: Mod assist to power up to standing and prevent inital L lateral thrust,  pt quick to move with squat pivot to drop arm recliner chiar on his right side.  Quick to move and uncoordinated resulting in him flinging himself over cues for safety and to go slow.   Ambulation/Gait                 Stairs             Wheelchair Mobility    Modified Rankin (Stroke Patients Only) Modified Rankin (Stroke Patients Only) Pre-Morbid Rankin Score: Slight disability Modified Rankin: Severe disability     Balance Overall balance assessment: Needs assistance Sitting-balance support: Feet supported;Single extremity supported Sitting balance-Leahy Scale: Poor Sitting balance - Comments: when pt lets go of the bed rail he falls posteriorly and to the L requiring physcal assist to prevent LOB.  Postural control: Posterior lean;Left lateral lean Standing balance support: Single extremity supported Standing balance-Leahy Scale: Poor Standing balance comment: dependent on physical therapist and R hand supported for balance in standing.                             Cognition Arousal/Alertness: Awake/alert Behavior During Therapy: Impulsive Overall Cognitive Status: Impaired/Different from baseline Area of Impairment: Problem solving;Memory;Following commands;Safety/judgement;Awareness;Attention                   Current Attention Level: Selective Memory: Decreased short-term memory Following Commands: Follows one step commands consistently;Follows multi-step commands inconsistently Safety/Judgement: Decreased awareness of safety;Decreased awareness of  deficits Awareness: Emergent Problem Solving: Difficulty sequencing;Requires verbal cues;Requires tactile cues General Comments: Pt quite impulsive, when asked if it would be SAFE for him to transfer himself back to bed he said, " I could do it" and then when reiterated would it be safe he said, "probably not" so I made sure he was on a chair alarm when I left him up in the recliner and told his RN  he was up.        Exercises General Exercises - Lower Extremity Mini-Sqauts: AROM;Both;10 reps;Other (comment) (at least 4 sets) Other Exercises Other Exercises: worked at length in standing on weight shifts, pre gait lifting one leg at a time, squats and squat transitions working on equal weight shift on both legs.  He did better once I put him in front of the mirror at the sink finding midline.  He would likely do well with a full length mirror for both sitting and standing balance.      General Comments General comments (skin integrity, edema, etc.): HR 110s-130s during mobility.       Pertinent Vitals/Pain Pain Assessment: No/denies pain    Home Living                      Prior Function            PT Goals (current goals can now be found in the care plan section) Acute Rehab PT Goals Patient Stated Goal: to get back to an active lifestyle again Progress towards PT goals: Progressing toward goals    Frequency    Min 4X/week      PT Plan Current plan remains appropriate    Co-evaluation              AM-PAC PT "6 Clicks" Mobility   Outcome Measure  Help needed turning from your back to your side while in a flat bed without using bedrails?: A Little Help needed moving from lying on your back to sitting on the side of a flat bed without using bedrails?: A Lot Help needed moving to and from a bed to a chair (including a wheelchair)?: A Lot Help needed standing up from a chair using your arms (e.g., wheelchair or bedside chair)?: A Lot Help needed to walk in hospital room?: A Lot Help needed climbing 3-5 steps with a railing? : Total 6 Click Score: 12    End of Session Equipment Utilized During Treatment: Gait belt Activity Tolerance: Patient tolerated treatment well Patient left: in chair;with call bell/phone within reach;with chair alarm set Nurse Communication: Mobility status PT Visit Diagnosis: Difficulty in walking, not elsewhere classified  (R26.2);Hemiplegia and hemiparesis Hemiplegia - Right/Left: Left Hemiplegia - dominant/non-dominant: Non-dominant Hemiplegia - caused by: Cerebral infarction     Time: 1431-1520 PT Time Calculation (min) (ACUTE ONLY): 49 min  Charges:  $Therapeutic Activity: 8-22 mins $Neuromuscular Re-education: 23-37 mins                     Verdene Lennert, PT, DPT  Acute Rehabilitation [phone] pager [phone]) [phone] office

## 2020-09-29 NOTE — Care Management Important Message (Signed)
Important Message  Patient Details  Name: Howard White MRN: [id number] Date of Birth: 08/29/1961   Medicare Important Message Given:  Yes     Angelice Piech 09/29/2020, 3:19 PM

## 2020-09-29 NOTE — Plan of Care (Signed)
A/Ox4, LCTA, NSR-ST. Patient has had no issues today. Up in chair with PT.    Problem: Education: Goal: Knowledge of disease or condition will improve Outcome: Progressing Goal: Knowledge of secondary prevention will improve Outcome: Progressing Goal: Knowledge of patient specific risk factors addressed and post discharge goals established will improve Outcome: Progressing Goal: Individualized Educational Video(s) Outcome: Progressing   Problem: Coping: Goal: Will verbalize positive feelings about self Outcome: Progressing   Problem: Health Behavior/Discharge Planning: Goal: Ability to manage health-related needs will improve Outcome: Progressing   Problem: Self-Care: Goal: Ability to participate in self-care as condition permits will improve Outcome: Progressing Goal: Verbalization of feelings and concerns over difficulty with self-care will improve Outcome: Progressing Goal: Ability to communicate needs accurately will improve Outcome: Progressing   Problem: Nutrition: Goal: Risk of aspiration will decrease Outcome: Progressing Goal: Dietary intake will improve Outcome: Progressing   Problem: Ischemic Stroke/TIA Tissue Perfusion: Goal: Complications of ischemic stroke/TIA will be minimized Outcome: Progressing   Problem: Education: Goal: Knowledge of General Education information will improve Description: Including pain rating scale, medication(s)/side effects and non-pharmacologic comfort measures Outcome: Progressing   Problem: Health Behavior/Discharge Planning: Goal: Ability to manage health-related needs will improve Outcome: Progressing   Problem: Clinical Measurements: Goal: Ability to maintain clinical measurements within normal limits will improve Outcome: Progressing Goal: Will remain free from infection Outcome: Progressing Goal: Diagnostic test results will improve Outcome: Progressing Goal: Respiratory complications will improve Outcome:  Progressing Goal: Cardiovascular complication will be avoided Outcome: Progressing   Problem: Activity: Goal: Risk for activity intolerance will decrease Outcome: Progressing   Problem: Nutrition: Goal: Adequate nutrition will be maintained Outcome: Progressing   Problem: Coping: Goal: Level of anxiety will decrease Outcome: Progressing   Problem: Elimination: Goal: Will not experience complications related to bowel motility Outcome: Progressing Goal: Will not experience complications related to urinary retention Outcome: Progressing   Problem: Pain Managment: Goal: General experience of comfort will improve Outcome: Progressing   Problem: Safety: Goal: Ability to remain free from injury will improve Outcome: Progressing   Problem: Skin Integrity: Goal: Risk for impaired skin integrity will decrease Outcome: Progressing

## 2020-09-29 NOTE — Progress Notes (Signed)
Inpatient Rehab Admissions Coordinator:   I met with pt. At bedside to continue discussion regarding potential CIR admission. Pt. Remains interested. I received insurance authorization around 11:15AM today, but do not have an available bed for patient today. Will continue to follow for potential admission tomorrow or later this week, pending bed availability.   Clemens Catholic, [place], [place] Admissions Coordinator  [phone] ([place]) [phone] (office)

## 2020-09-29 NOTE — H&P (Signed)
Physical Medicine and Rehabilitation Admission H&P    Chief Complaint  Patient presents with  . 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  Per chart review patient lives with elderly parents.  Reportedly independent prior to admission.  Presented 09/26/2020 left-sided weakness facial droop.  Cranial CT scan showed no acute intracranial abnormality or significant interval change.  Left frontal craniotomy with resection cavity noted.  CT angiogram of head and neck without large vessel occlusion or limiting proximal stenosis.  MRI of the brain showed a 1.9 x 1.2 cm acute infarct extending from the right corona radiata into the right basal ganglia.  Echocardiogram with ejection fraction of 60 to 65% no wall motion abnormalities.  Admission chemistries unremarkable except potassium 5.5 glucose 102.  Maintain on aspirin and Plavix for CVA prophylaxis.  X3 weeks and aspirin alone.  Tolerating regular diet.  Therapy evaluations completed and patient was admitted for a comprehensive rehab program.  Review of Systems  Constitutional: Negative for chills and fever.  HENT: Negative for hearing loss.   Eyes: Negative for blurred vision and double vision.  Respiratory: Negative for cough and shortness of breath.   Cardiovascular: Negative for chest pain, palpitations and leg swelling.  Gastrointestinal: Positive for constipation. Negative for heartburn, nausea and vomiting.  Genitourinary: Negative for dysuria, flank pain and hematuria.  Musculoskeletal: Positive for joint pain and myalgias.  Skin: Negative for rash.  Neurological: Positive for weakness.  Psychiatric/Behavioral: Positive for depression. The patient has insomnia.   All other systems reviewed and are negative.  Past  Medical History:  Diagnosis Date  . Brain cancer ([place])   . Seizures ([place])   . Thyroid disease    Hypothyroid   Past Surgical History:  Procedure Laterality Date  . brain cancer    . BRAIN SURGERY     Family History  Problem Relation Age of Onset  . Brain cancer Unknown   . Thyroid disease Unknown   . Cancer Unknown   . Diabetes Unknown   . Stroke Unknown    Social History:  reports that he has never smoked. He has never used smokeless tobacco. He reports current alcohol use. He reports that he does not use drugs. Allergies:  Allergies  Allergen Reactions  . Lactose Intolerance (Gi)    Medications Prior to Admission  Medication Sig Dispense Refill  . acetaminophen (TYLENOL) 500 MG tablet Take 1,000 mg by mouth every 6 (six) hours as needed for mild pain.    Marland Kitchen escitalopram (LEXAPRO) 10 MG tablet Take 10 mg by mouth daily.    . famotidine (PEPCID) 20 MG tablet Take 20 mg by mouth daily.    . fluticasone (FLONASE) 50 MCG/ACT nasal spray Place 2 sprays into both nostrils daily as needed for allergies.     Marland Kitchen lamoTRIgine (LAMICTAL) 150 MG tablet TAKE 1 AND 1/2 TABS EVERY MORNING AND 2 TAB EVERY EVENING (Patient taking differently: Take 225-300 mg by mouth See admin instructions. TAKE 1 AND 1/2 TABS ( 225mg )  EVERY MORNING AND 2 TAB (300mg ) EVERY EVENING) 315 tablet 3  . Multiple Vitamins-Minerals (MULTIVITAMIN WITH MINERALS) tablet Take 1 tablet by mouth daily.    Marland Kitchen SYNTHROID 112 MCG tablet Take 112 mcg by mouth at bedtime.       Drug Regimen Review Drug  regimen was reviewed and remains appropriate with no significant issues identified  Home: Home Living Family/patient expects to be discharged to:: Private residence Living Arrangements: Parent (parents) Available Help at Discharge: Family, Available 24 hours/day Type of Home: House Home Access: Level entry Home Layout: Two level, Able to live on main level with bedroom/bathroom Alternate Level Stairs-Number of Steps:  flight Alternate Level Stairs-Rails: Right Bathroom Shower/Tub: Multimedia programmer: Standard Bathroom Accessibility: Yes Home Equipment: Hand held shower head  Lives With: Family   Functional History: Prior Function Level of Independence: Independent Comments: retired due to Animal nutritionist, indep with ADLs  Functional Status:  Mobility: Bed Mobility Overal bed mobility: Needs Assistance Bed Mobility: Supine to Sit Supine to sit: Mod assist, HOB elevated Sit to supine: Mod assist General bed mobility comments: Mod assist to prevent posterior LOB during transition to sitting.  Pt moving very quickly grasping at bed, rails, therapist to come up to sitting with his right hand.   Transfers Overall transfer level: Needs assistance Equipment used: None Transfers: Sit to/from Stand, Google Transfers Sit to Stand: Mod assist Stand pivot transfers: Max assist Squat pivot transfers: Mod assist General transfer comment: Mod assist to power up to standing and prevent inital L lateral thrust, pt quick to move with squat pivot to drop arm recliner chiar on his right side.  Quick to move and uncoordinated resulting in him flinging himself over cues for safety and to go slow.  Ambulation/Gait General Gait Details: able to transfer to chair, deferred amb due to needing second person for safety    ADL: ADL Overall ADL's : Needs assistance/impaired Eating/Feeding: Minimal assistance, Sitting Grooming: Min guard, Sitting, Wash/dry face Grooming Details (indicate cue type and reason): seated EOB Upper Body Bathing: Minimal assistance, Sitting Lower Body Bathing: Maximal assistance, Sit to/from stand Upper Body Dressing : Moderate assistance, Sitting Lower Body Dressing: Maximal assistance, Sitting/lateral leans, Bed level Lower Body Dressing Details (indicate cue type and reason): donning socks in bed today, able to don R sock, cueing for technique for L sock with max  assist  Toileting- Clothing Manipulation and Hygiene: Total assistance, Sit to/from stand Functional mobility during ADLs: Moderate assistance (sit<>stand ) General ADL Comments: focused on L UE exercises today   Cognition: Cognition Overall Cognitive Status: Impaired/Different from baseline Arousal/Alertness: Awake/alert Orientation Level: Oriented X4 Attention: Focused, Sustained Focused Attention: Appears intact Sustained Attention: Appears intact Memory: Impaired Memory Impairment: Decreased short term memory Decreased Short Term Memory: Verbal basic Awareness: Appears intact Problem Solving: Impaired Problem Solving Impairment: Verbal basic, Verbal complex, Functional basic, Functional complex Executive Function: Reasoning, Organizing Reasoning: Impaired Reasoning Impairment: Verbal complex Organizing: Impaired Organizing Impairment: Verbal complex Safety/Judgment: Appears intact Cognition Arousal/Alertness: Awake/alert Behavior During Therapy: Impulsive Overall Cognitive Status: Impaired/Different from baseline Area of Impairment: Problem solving, Memory, Following commands, Safety/judgement, Awareness, Attention Current Attention Level: Selective Memory: Decreased short-term memory Following Commands: Follows one step commands consistently, Follows multi-step commands inconsistently Safety/Judgement: Decreased awareness of safety, Decreased awareness of deficits Awareness: Emergent Problem Solving: Difficulty sequencing, Requires verbal cues, Requires tactile cues General Comments: Pt quite impulsive, when asked if it would be SAFE for him to transfer himself back to bed he said, " I could do it" and then when reiterated would it be safe he said, "probably not" so I made sure he was on a chair alarm when I left him up in the recliner and told his RN he was up.    Physical Exam: Blood pressure 124/85, pulse  60, temperature 97.6 F (36.4 C), temperature source Oral, resp.  rate 18, height 5\' 10"  (1.778 m), weight 86.6 kg, SpO2 96 %.  General: Alert and oriented x 3, No apparent distress HEENT: Head is normocephalic, left facial droop.  Neck: Supple without JVD or lymphadenopathy Heart: Reg rate and rhythm. No murmurs rubs or gallops Chest: CTA bilaterally without wheezes, rales, or rhonchi; no distress Abdomen: Soft, non-tender, non-distended, bowel sounds positive. Extremities: No clubbing, cyanosis, or edema. Pulses are 2+ Skin: Clean and intact without signs of breakdown Neuro: Patient is alert in no acute distress.  Follows commands.  Oriented x3.  LUE: 3/5 SA, EE, 4/5 hand grip LLE: 3/5 HF, KE, 4/5 DF, PF Psych: Pt's affect is appropriate. Pt is cooperative  No results found for this or any previous visit (from the past 48 hour(s)). No results found.     Medical Problem List and Plan: 1.  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  Antithrombotics: -DVT/anticoagulation: SCDs  -antiplatelet therapy: Aspirin 81 mg daily and Plavix 75 mg daily x3 weeks then aspirin alone 3. Pain Management: Tylenol as needed. Denies pain 4. Mood: Lexapro 10 mg daily.  Provide emotional support  -antipsychotic agents: N/A 5. Neuropsych: This patient is capable of making decisions on his own behalf. 6. Skin/Wound Care: Routine skin checks 7. Fluids/Electrolytes/Nutrition: Routine in and outs with weekly chemistries. Electrolytes stable on 11/13 8.  Seizure disorder.  Continue Lamictal 225 mg daily and 300 mg nightly 9.  Hyperlipidemia.  LDL 131 on 11/13. Continue Lipitor 10.  Hypothyroidism.  Synthroid  Lavon Paganini Angiulli, PA-C 09/30/2020   I have personally performed a face to face diagnostic evaluation, including, but not limited to relevant history and physical exam  findings, of this patient and developed relevant assessment and plan.  Additionally, I have reviewed and concur with the physician assistant's documentation above.  Leeroy Cha, MD

## 2020-09-29 NOTE — Progress Notes (Signed)
PROGRESS NOTE    Howard White  QIO:[id number] DOB: [birth year] DOA: 09/26/2020 PCP: Seward Carol, MD   Brief Narrative:  Howard White is a 59 y.o. male with medical history significant of brain cancer, seizures and hypothyroidism.  Patient has been at his usual state of health until around 1:30 PM on 09/26/2020 when he noticed left sided weakness and left facial droop. His left-sided weakness is mainly affecting his left upper extremity and left face. He has been seizure-free for about 5 years, a recent neurologic follow-up 4 months ago was normal. When he was diagnosed with his brain cancer he was symptomatic on the right side of his body but this time presented with left-sided weakness. MRI brain shows 1.9 x 1.2 cm acute infarct extending from the right coronary radiator into the right basal ganglia.  Extensive T2 hyperintense signal abnormality throughout the cerebral white matter.  CT cerebral perfusion and CT angio of head and neck showed intracranial atherosclerosis without LVO.  Echo did not show any PFO or any thrombus.  Admitted to hospital service.  Neurology consulted.  Started on aspirin and Plavix.  Seen by PT OT who recommended CIR.   Assessment & Plan:   Principal Problem:   CVA (cerebral vascular accident) [facility]) Active Problems:   Epilepsy ([place])   Malignant neoplasm of brain ([place])   Astrocytoma brain tumor ([place])   Gait disorder   Memory loss   1.  Acute CVA: MRI brain shows 1.9 x 1.2 cm acute infarct extending from the right coronary radiator into the right basal ganglia.  Extensive T2 hyperintense signal abnormality throughout the cerebral white matter.  CT cerebral perfusion and CT angio of head and neck showed intracranial atherosclerosis without LVO.  Echo did not show any PFO or any thrombus.  Patient is still with left-sided upper and lower extremity weakness but it is improving.  Continue aspirin and Plavix.  PT OT recommended CIR. neurology signed off.  Continue  DAPT for 3 weeks and then aspirin alone.  According to the notes, we have received insurance authorization but CIR does not have a bed available today.  Patient's weakness is slowly improving on the left side.  2.  Epilepsy, seems to be well controlled.  Continue with antiseizure medications, lamotrigine.   3.  History of brain astrocytoma.  No signs of recurrence.  4. Depression. Continue with escitalopram.   5. Hypothyroid. Continue levothyroxine.   6.  Hyperlipidemia: LDL 131.  Continue atorvastatin 40 mg.  DVT prophylaxis: SCDs Start: 09/26/20 1539   Code Status: Full Code  Family Communication: None present at bedside.  Plan of care discussed with patient in length and he verbalized understanding and agreed with it.  Status is: Inpatient  Remains inpatient appropriate because:Inpatient level of care appropriate due to severity of illness   Dispo: The patient is from: Home              Anticipated d/c is to: CIR              Anticipated d/c date is: 1 to 2 days              Patient currently is medically stable to d/c.        Estimated body mass index is 27.39 kg/m as calculated from the following:   Height as of this encounter: 5\' 10"  (1.778 m).   Weight as of this encounter: 86.6 kg.      Nutritional status:  Consultants:   Neurology  Procedures:   None  Antimicrobials:  Anti-infectives (From admission, onward)   None         Subjective: Seen and examined.  Mother was not at the bedside today.  Patient has no new complaint.  He feels that his weakness is improving on the left side.  Objective: Vitals:   09/28/20 0547 09/28/20 1357 09/28/20 2105 09/29/20 0504  BP: 125/78 (!) 146/87 134/88 114/68  Pulse: 69 73 76 82  Resp: 19 17 20 18   Temp: 98.5 F (36.9 C) 98 F (36.7 C) 97.8 F (36.6 C) 98.3 F (36.8 C)  TempSrc: Oral  Oral Oral  SpO2: 95% 94% 93% 97%  Weight:      Height:        Intake/Output Summary  (Last 24 hours) at 09/29/2020 1256 Last data filed at 09/29/2020 0326 Gross per 24 hour  Intake --  Output 1920 ml  Net -1920 ml   Filed Weights   09/26/20 1453 09/26/20 1931  Weight: 76.2 kg 86.6 kg    Examination:  General exam: Appears calm and comfortable  Respiratory system: Clear to auscultation. Respiratory effort normal. Cardiovascular system: S1 & S2 heard, RRR. No JVD, murmurs, rubs, gallops or clicks. No pedal edema. Gastrointestinal system: Abdomen is nondistended, soft and nontender. No organomegaly or masses felt. Normal bowel sounds heard. Central nervous system: Alert and oriented.  4.5/5 power in left upper and lower extremity.  Very mild left facial droop. Extremities: Symmetric 5 x 5 power. Skin: No rashes, lesions or ulcers.  Psychiatry: Judgement and insight appear normal. Mood & affect appropriate.   Data Reviewed: I have personally reviewed following labs and imaging studies  CBC: Recent Labs  Lab 09/26/20 1419 09/26/20 1432 09/26/20 1748 09/27/20 0501  WBC 6.8  --  7.4 7.5  NEUTROABS 3.6  --   --   --   HGB 15.5 16.0 15.2 16.2  HCT 46.4 47.0 46.2 47.0  MCV 94.3  --  94.3 91.8  PLT 230  --  256 106   Basic Metabolic Panel: Recent Labs  Lab 09/26/20 1419 09/26/20 1432 09/26/20 1748 09/27/20 0501  NA 136 137  --  136  K 5.5* 4.9  --  3.8  CL 101 101  --  103  CO2 27  --   --  25  GLUCOSE 101* 97  --  97  BUN 17 23*  --  15  CREATININE 1.10 1.00 1.03 1.00  CALCIUM 9.4  --   --  9.5   GFR: Estimated Creatinine Clearance: 83.1 mL/min (by C-G formula based on SCr of 1 mg/dL). Liver Function Tests: Recent Labs  Lab 09/26/20 1419  AST 39  ALT 22  ALKPHOS 44  BILITOT 1.0  PROT 6.4*  ALBUMIN 4.2   No results for input(s): LIPASE, AMYLASE in the last 168 hours. No results for input(s): AMMONIA in the last 168 hours. Coagulation Profile: Recent Labs  Lab 09/26/20 1419  INR 1.0   Cardiac Enzymes: No results for input(s): CKTOTAL,  CKMB, CKMBINDEX, TROPONINI in the last 168 hours. BNP (last 3 results) No results for input(s): PROBNP in the last 8760 hours. HbA1C: Recent Labs    09/27/20 0501  HGBA1C 5.4   CBG: Recent Labs  Lab 09/26/20 1418  GLUCAP 95   Lipid Profile: Recent Labs    09/27/20 0501  CHOL 199  HDL 42  LDLCALC 131*  TRIG 130  CHOLHDL 4.7   Thyroid Function Tests:  No results for input(s): TSH, T4TOTAL, FREET4, T3FREE, THYROIDAB in the last 72 hours. Anemia Panel: No results for input(s): VITAMINB12, FOLATE, FERRITIN, TIBC, IRON, RETICCTPCT in the last 72 hours. Sepsis Labs: No results for input(s): PROCALCITON, LATICACIDVEN in the last 168 hours.  Recent Results (from the past 240 hour(s))  Respiratory Panel by RT PCR (Flu A&B, Covid) - Nasopharyngeal Swab     Status: None   Collection Time: 09/26/20  3:33 PM   Specimen: Nasopharyngeal Swab  Result Value Ref Range Status   SARS Coronavirus 2 by RT PCR NEGATIVE NEGATIVE Final    Comment: (NOTE) SARS-CoV-2 target nucleic acids are NOT DETECTED.  The SARS-CoV-2 RNA is generally detectable in upper respiratoy specimens during the acute phase of infection. The lowest concentration of SARS-CoV-2 viral copies this assay can detect is 131 copies/mL. A negative result does not preclude SARS-Cov-2 infection and should not be used as the sole basis for treatment or other patient management decisions. A negative result may occur with  improper specimen collection/handling, submission of specimen other than nasopharyngeal swab, presence of viral mutation(s) within the areas targeted by this assay, and inadequate number of viral copies (<131 copies/mL). A negative result must be combined with clinical observations, patient history, and epidemiological information. The expected result is Negative.  Fact Sheet for Patients:  PinkCheek.be  Fact Sheet for Healthcare Providers:    GravelBags.it  This test is no t yet approved or cleared by the [place] FDA and  has been authorized for detection and/or diagnosis of SARS-CoV-2 by FDA under an Emergency Use Authorization (EUA). This EUA will remain  in effect (meaning this test can be used) for the duration of the COVID-19 declaration under Section 564(b)(1) of the Act, 21 U.S.C. section 360bbb-3(b)(1), unless the authorization is terminated or revoked sooner.     Influenza A by PCR NEGATIVE NEGATIVE Final   Influenza B by PCR NEGATIVE NEGATIVE Final    Comment: (NOTE) The Xpert Xpress SARS-CoV-2/FLU/RSV assay is intended as an aid in  the diagnosis of influenza from Nasopharyngeal swab specimens and  should not be used as a sole basis for treatment. Nasal washings and  aspirates are unacceptable for Xpert Xpress SARS-CoV-2/FLU/RSV  testing.  Fact Sheet for Patients: PinkCheek.be  Fact Sheet for Healthcare Providers: GravelBags.it  This test is not yet approved or cleared by the [place] FDA and  has been authorized for detection and/or diagnosis of SARS-CoV-2 by  FDA under an Emergency Use Authorization (EUA). This EUA will remain  in effect (meaning this test can be used) for the duration of the  Covid-19 declaration under Section 564(b)(1) of the Act, 21  U.S.C. section 360bbb-3(b)(1), unless the authorization is  terminated or revoked. Performed at [facility], [place] [address] [place], [place] [address]       Radiology Studies: No results found.  Scheduled Meds: .  stroke: mapping our early stages of recovery book   Does not apply Once  . aspirin EC  81 mg Oral Daily  . atorvastatin  40 mg Oral Daily  . clopidogrel  75 mg Oral Daily  . docusate sodium  100 mg Oral BID  . escitalopram  10 mg Oral Daily  . famotidine  20 mg Oral Daily  . lamoTRIgine  225 mg Oral Daily  . lamoTRIgine  300 mg  Oral QHS  . levothyroxine  112 mcg Oral QHS  . multivitamin with minerals  1 tablet Oral Daily   Continuous Infusions:  LOS: 3 days   Time spent: 27 minutes   Darliss Cheney, MD [facility]  09/29/2020, 12:56 PM   To contact the attending provider between 7A-7P or the covering provider during after hours 7P-7A, please log into the web site [email or web address].

## 2020-09-30 ENCOUNTER — Other Ambulatory Visit: Payer: Self-pay

## 2020-09-30 ENCOUNTER — Encounter (HOSPITAL_COMMUNITY): Payer: Self-pay | Admitting: Physical Medicine & Rehabilitation

## 2020-09-30 ENCOUNTER — Inpatient Hospital Stay (HOSPITAL_COMMUNITY)
Admission: RE | Admit: 2020-09-30 | Discharge: 2020-10-29 | DRG: 057 | Disposition: A | Discharge status: Home Health | Payer: PPO | Source: Intra-hospital | Attending: Physical Medicine & Rehabilitation | Admitting: Physical Medicine & Rehabilitation

## 2020-09-30 DIAGNOSIS — G8110 Spastic hemiplegia affecting unspecified side: Secondary | ICD-10-CM

## 2020-09-30 DIAGNOSIS — E039 Hypothyroidism, unspecified: Secondary | ICD-10-CM | POA: Diagnosis present

## 2020-09-30 DIAGNOSIS — Z833 Family history of diabetes mellitus: Secondary | ICD-10-CM | POA: Diagnosis not present

## 2020-09-30 DIAGNOSIS — R17 Unspecified jaundice: Secondary | ICD-10-CM | POA: Diagnosis present

## 2020-09-30 DIAGNOSIS — I69392 Facial weakness following cerebral infarction: Secondary | ICD-10-CM

## 2020-09-30 DIAGNOSIS — Z85841 Personal history of malignant neoplasm of brain: Secondary | ICD-10-CM | POA: Diagnosis not present

## 2020-09-30 DIAGNOSIS — I6381 Other cerebral infarction due to occlusion or stenosis of small artery: Secondary | ICD-10-CM

## 2020-09-30 DIAGNOSIS — G40909 Epilepsy, unspecified, not intractable, without status epilepticus: Secondary | ICD-10-CM | POA: Diagnosis not present

## 2020-09-30 DIAGNOSIS — F32A Depression, unspecified: Secondary | ICD-10-CM | POA: Diagnosis not present

## 2020-09-30 DIAGNOSIS — Z823 Family history of stroke: Secondary | ICD-10-CM | POA: Diagnosis not present

## 2020-09-30 DIAGNOSIS — R569 Unspecified convulsions: Secondary | ICD-10-CM

## 2020-09-30 DIAGNOSIS — K5901 Slow transit constipation: Secondary | ICD-10-CM | POA: Diagnosis not present

## 2020-09-30 DIAGNOSIS — Z79899 Other long term (current) drug therapy: Secondary | ICD-10-CM

## 2020-09-30 DIAGNOSIS — D72829 Elevated white blood cell count, unspecified: Secondary | ICD-10-CM | POA: Diagnosis present

## 2020-09-30 DIAGNOSIS — R32 Unspecified urinary incontinence: Secondary | ICD-10-CM | POA: Diagnosis not present

## 2020-09-30 DIAGNOSIS — G479 Sleep disorder, unspecified: Secondary | ICD-10-CM | POA: Diagnosis not present

## 2020-09-30 DIAGNOSIS — E785 Hyperlipidemia, unspecified: Secondary | ICD-10-CM | POA: Diagnosis not present

## 2020-09-30 DIAGNOSIS — I69354 Hemiplegia and hemiparesis following cerebral infarction affecting left non-dominant side: Secondary | ICD-10-CM | POA: Diagnosis not present

## 2020-09-30 DIAGNOSIS — Z79890 Hormone replacement therapy (postmenopausal): Secondary | ICD-10-CM

## 2020-09-30 DIAGNOSIS — R159 Full incontinence of feces: Secondary | ICD-10-CM | POA: Diagnosis not present

## 2020-09-30 DIAGNOSIS — R001 Bradycardia, unspecified: Secondary | ICD-10-CM | POA: Diagnosis not present

## 2020-09-30 MED ORDER — ONDANSETRON HCL 4 MG PO TABS
4.0000 mg | ORAL_TABLET | Freq: Four times a day (QID) | ORAL | Status: DC | PRN
  Administered 2020-10-06: 4 mg via ORAL
  Filled 2020-09-30: qty 1

## 2020-09-30 MED ORDER — ACETAMINOPHEN 325 MG PO TABS
650.0000 mg | ORAL_TABLET | Freq: Four times a day (QID) | ORAL | Status: DC | PRN
  Administered 2020-10-01 – 2020-10-29 (×19): 650 mg via ORAL
  Filled 2020-09-30 (×19): qty 2

## 2020-09-30 MED ORDER — ASPIRIN 81 MG PO TBEC: 81.0000 mg | DELAYED_RELEASE_TABLET | Freq: Every day | ORAL | 11 refills | Status: AC

## 2020-09-30 MED ORDER — DOCUSATE SODIUM 100 MG PO CAPS
100.0000 mg | ORAL_CAPSULE | Freq: Two times a day (BID) | ORAL | Status: DC
  Administered 2020-09-30 – 2020-10-02 (×4): 100 mg via ORAL
  Filled 2020-09-30 (×5): qty 1

## 2020-09-30 MED ORDER — CLOPIDOGREL BISULFATE 75 MG PO TABS: 75.0000 mg | ORAL_TABLET | Freq: Every day | ORAL | 0 refills | Status: DC

## 2020-09-30 MED ORDER — LAMOTRIGINE 150 MG PO TABS
300.0000 mg | ORAL_TABLET | Freq: Every day | ORAL | Status: DC
  Administered 2020-09-30 – 2020-10-28 (×29): 300 mg via ORAL
  Filled 2020-09-30 (×30): qty 2

## 2020-09-30 MED ORDER — CLOPIDOGREL BISULFATE 75 MG PO TABS
75.0000 mg | ORAL_TABLET | Freq: Every day | ORAL | Status: AC
  Administered 2020-10-01 – 2020-10-17 (×17): 75 mg via ORAL
  Filled 2020-09-30 (×17): qty 1

## 2020-09-30 MED ORDER — ONDANSETRON HCL 4 MG/2ML IJ SOLN: 4.0000 mg | Freq: Four times a day (QID) | INTRAMUSCULAR | Status: DC | PRN

## 2020-09-30 MED ORDER — LEVOTHYROXINE SODIUM 112 MCG PO TABS
112.0000 ug | ORAL_TABLET | Freq: Every day | ORAL | Status: DC
  Administered 2020-09-30 – 2020-10-28 (×29): 112 ug via ORAL
  Filled 2020-09-30 (×29): qty 1

## 2020-09-30 MED ORDER — ADULT MULTIVITAMIN W/MINERALS CH
1.0000 | ORAL_TABLET | Freq: Every day | ORAL | Status: DC
  Administered 2020-10-01 – 2020-10-29 (×29): 1 via ORAL
  Filled 2020-09-30 (×29): qty 1

## 2020-09-30 MED ORDER — ATORVASTATIN CALCIUM 40 MG PO TABS: 40.0000 mg | ORAL_TABLET | Freq: Every day | ORAL | 0 refills | Status: DC

## 2020-09-30 MED ORDER — ATORVASTATIN CALCIUM 40 MG PO TABS
40.0000 mg | ORAL_TABLET | Freq: Every day | ORAL | Status: DC
  Administered 2020-10-01 – 2020-10-29 (×29): 40 mg via ORAL
  Filled 2020-09-30 (×29): qty 1

## 2020-09-30 MED ORDER — ACETAMINOPHEN 650 MG RE SUPP: 650.0000 mg | Freq: Four times a day (QID) | RECTAL | Status: DC | PRN

## 2020-09-30 MED ORDER — FAMOTIDINE 20 MG PO TABS
20.0000 mg | ORAL_TABLET | Freq: Every day | ORAL | Status: DC
  Administered 2020-10-01 – 2020-10-29 (×29): 20 mg via ORAL
  Filled 2020-09-30 (×29): qty 1

## 2020-09-30 MED ORDER — FLUTICASONE PROPIONATE 50 MCG/ACT NA SUSP
2.0000 | Freq: Every day | NASAL | Status: DC | PRN
  Administered 2020-10-01: 2 via NASAL
  Filled 2020-09-30: qty 16

## 2020-09-30 MED ORDER — LAMOTRIGINE 25 MG PO TABS
225.0000 mg | ORAL_TABLET | Freq: Every day | ORAL | Status: DC
  Administered 2020-10-01 – 2020-10-29 (×29): 225 mg via ORAL
  Filled 2020-09-30 (×29): qty 1

## 2020-09-30 MED ORDER — ESCITALOPRAM OXALATE 10 MG PO TABS
10.0000 mg | ORAL_TABLET | Freq: Every day | ORAL | Status: DC
  Administered 2020-10-01 – 2020-10-29 (×29): 10 mg via ORAL
  Filled 2020-09-30 (×29): qty 1

## 2020-09-30 MED ORDER — ASPIRIN EC 81 MG PO TBEC
81.0000 mg | DELAYED_RELEASE_TABLET | Freq: Every day | ORAL | Status: DC
  Administered 2020-10-01 – 2020-10-29 (×29): 81 mg via ORAL
  Filled 2020-09-30 (×29): qty 1

## 2020-09-30 MED ORDER — WHITE PETROLATUM EX OINT
1.0000 "application " | TOPICAL_OINTMENT | CUTANEOUS | Status: DC | PRN
  Filled 2020-09-30: qty 28.35

## 2020-09-30 NOTE — H&P (Signed)
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  Per chart review patient lives with elderly parents.  Reportedly independent prior to admission.  Presented 09/26/2020 left-sided weakness facial droop.  Cranial CT scan showed no acute intracranial abnormality or significant interval change.  Left frontal craniotomy with resection cavity noted.  CT angiogram of head and neck without large vessel occlusion or limiting proximal stenosis.  MRI of the brain showed a 1.9 x 1.2 cm acute infarct extending from the right corona radiata into the right basal ganglia.  Echocardiogram with ejection fraction of 60 to 65% no wall motion abnormalities.  Admission chemistries unremarkable except potassium 5.5 glucose 102.  Maintain on aspirin and Plavix for CVA prophylaxis.  X3 weeks and aspirin alone.  Tolerating regular diet.  Therapy evaluations completed and patient was admitted for a comprehensive rehab program.  Review of Systems  Constitutional: Negative for chills and fever.  HENT: Negative for hearing loss.   Eyes: Negative for blurred vision and double vision.  Respiratory: Negative for cough and shortness of breath.   Cardiovascular: Negative for chest pain, palpitations and leg swelling.  Gastrointestinal: Positive for constipation. Negative for heartburn, nausea and vomiting.  Genitourinary: Negative for dysuria, flank pain and hematuria.  Musculoskeletal: Positive for joint pain and myalgias.  Skin: Negative for rash.  Neurological: Positive for weakness.  Psychiatric/Behavioral: Positive for depression. The patient has insomnia.   All other systems reviewed and are negative.  Past Medical History:  Diagnosis  Date  . Brain cancer ([place])   . Seizures ([place])   . Thyroid disease    Hypothyroid   Past Surgical History:  Procedure Laterality Date  . brain cancer    . BRAIN SURGERY     Family History  Problem Relation Age of Onset  . Brain cancer Other   . Thyroid disease Other   . Cancer Other   . Diabetes Other   . Stroke Other    Social History:  reports that he has never smoked. He has never used smokeless tobacco. He reports current alcohol use. He reports that he does not use drugs. Allergies:  Allergies  Allergen Reactions  . Lactose Intolerance (Gi)    Medications Prior to Admission  Medication Sig Dispense Refill  . acetaminophen (TYLENOL) 500 MG tablet Take 1,000 mg by mouth every 6 (six) hours as needed for mild pain.    Derrill Memo ON 10/01/2020] aspirin EC 81 MG EC tablet Take 1 tablet (81 mg total) by mouth daily. Swallow whole. 30 tablet 11  . [START ON 10/01/2020] atorvastatin (LIPITOR) 40 MG tablet Take 1 tablet (40 mg total) by mouth daily. 30 tablet 0  . [START ON 10/01/2020] clopidogrel (PLAVIX) 75 MG tablet Take 1 tablet (75 mg total) by mouth daily for 18 days. 18 tablet 0  . escitalopram (LEXAPRO) 10 MG tablet Take 10 mg by mouth daily.    . famotidine (PEPCID) 20 MG tablet Take 20 mg by mouth daily.    . fluticasone (FLONASE) 50 MCG/ACT nasal spray Place 2 sprays into both nostrils daily as needed for allergies.     Marland Kitchen lamoTRIgine (LAMICTAL) 150 MG tablet TAKE 1 AND 1/2 TABS EVERY MORNING AND 2 TAB EVERY EVENING (Patient taking differently: Take  225-300 mg by mouth See admin instructions. TAKE 1 AND 1/2 TABS ( 225mg )  EVERY MORNING AND 2 TAB (300mg ) EVERY EVENING) 315 tablet 3  . Multiple Vitamins-Minerals (MULTIVITAMIN WITH MINERALS) tablet Take 1 tablet by mouth daily.    Marland Kitchen SYNTHROID 112 MCG tablet Take 112 mcg by mouth at bedtime.       Drug Regimen Review Drug regimen was reviewed and remains appropriate with no significant issues identified Home: Home  Living Family/patient expects to be discharged to:: Private residence Living Arrangements: Parent (parents) Available Help at Discharge: Family, Available 24 hours/day Type of Home: House Home Access: Level entry Home Layout: Two level, Able to live on main level with bedroom/bathroom Alternate Level Stairs-Number of Steps: flight Alternate Level Stairs-Rails: Right Bathroom Shower/Tub: Multimedia programmer: Standard Bathroom Accessibility: Yes Home Equipment: Hand held shower head  Lives With: Family   Functional History: Prior Function Level of Independence: Independent Comments: retired due to Animal nutritionist, indep with ADLs  Functional Status:  Mobility: Bed Mobility Overal bed mobility: Needs Assistance Bed Mobility: Supine to Sit Supine to sit: Mod assist, HOB elevated Sit to supine: Mod assist General bed mobility comments: Mod assist to prevent posterior LOB during transition to sitting.  Pt moving very quickly grasping at bed, rails, therapist to come up to sitting with his right hand.   Transfers Overall transfer level: Needs assistance Equipment used: None Transfers: Sit to/from Stand, Google Transfers Sit to Stand: Mod assist Stand pivot transfers: Max assist Squat pivot transfers: Mod assist General transfer comment: Mod assist to power up to standing and prevent inital L lateral thrust, pt quick to move with squat pivot to drop arm recliner chiar on his right side.  Quick to move and uncoordinated resulting in him flinging himself over cues for safety and to go slow.  Ambulation/Gait General Gait Details: able to transfer to chair, deferred amb due to needing second person for safety  ADL: ADL Overall ADL's : Needs assistance/impaired Eating/Feeding: Minimal assistance, Sitting Grooming: Min guard, Sitting, Wash/dry face Grooming Details (indicate cue type and reason): seated EOB Upper Body Bathing: Minimal assistance, Sitting Lower  Body Bathing: Maximal assistance, Sit to/from stand Upper Body Dressing : Moderate assistance, Sitting Lower Body Dressing: Maximal assistance, Sitting/lateral leans, Bed level Lower Body Dressing Details (indicate cue type and reason): donning socks in bed today, able to don R sock, cueing for technique for L sock with max assist  Toileting- Clothing Manipulation and Hygiene: Total assistance, Sit to/from stand Functional mobility during ADLs: Moderate assistance (sit<>stand ) General ADL Comments: focused on L UE exercises today   Cognition: Cognition Overall Cognitive Status: Impaired/Different from baseline Arousal/Alertness: Awake/alert Orientation Level: Oriented X4 Attention: Focused, Sustained Focused Attention: Appears intact Sustained Attention: Appears intact Memory: Impaired Memory Impairment: Decreased short term memory Decreased Short Term Memory: Verbal basic Awareness: Appears intact Problem Solving: Impaired Problem Solving Impairment: Verbal basic, Verbal complex, Functional basic, Functional complex Executive Function: Reasoning, Organizing Reasoning: Impaired Reasoning Impairment: Verbal complex Organizing: Impaired Organizing Impairment: Verbal complex Safety/Judgment: Appears intact Cognition Arousal/Alertness: Awake/alert Behavior During Therapy: Impulsive Overall Cognitive Status: Impaired/Different from baseline Area of Impairment: Problem solving, Memory, Following commands, Safety/judgement, Awareness, Attention Current Attention Level: Selective Memory: Decreased short-term memory Following Commands: Follows one step commands consistently, Follows multi-step commands inconsistently Safety/Judgement: Decreased awareness of safety, Decreased awareness of deficits Awareness: Emergent Problem Solving: Difficulty sequencing, Requires verbal cues, Requires tactile cues General Comments: Pt quite impulsive, when asked if it would be  SAFE for him to  transfer himself back to bed he said, " I could do it" and then when reiterated would it be safe he said, "probably not" so I made sure he was on a chair alarm when I left him up in the recliner and told his RN he was up.    Physical Exam: Blood pressure 118/85, pulse 85, temperature 97.9 F (36.6 C), temperature source Oral, resp. rate 17, height 5\' 10"  (1.778 m), weight 84.4 kg, SpO2 94 %.  General: Alert and oriented x 3, No apparent distress HEENT: Head is normocephalic, left facial droop.  Neck: Supple without JVD or lymphadenopathy Heart: Reg rate and rhythm. No murmurs rubs or gallops Chest: CTA bilaterally without wheezes, rales, or rhonchi; no distress Abdomen: Soft, non-tender, non-distended, bowel sounds positive. Extremities: No clubbing, cyanosis, or edema. Pulses are 2+ Skin: Clean and intact without signs of breakdown Neuro: Patient is alert in no acute distress.  Follows commands.  Oriented x3.  LUE: 3/5 SA, EE, 4/5 hand grip LLE: 3/5 HF, KE, 4/5 DF, PF Psych: Pt's affect is appropriate. Pt is cooperative  No results found for this or any previous visit (from the past 48 hour(s)). No results found.     Medical Problem List and Plan: 1.  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  Antithrombotics: -DVT/anticoagulation: SCDs  -antiplatelet therapy: Aspirin 81 mg daily and Plavix 75 mg daily x3 weeks then aspirin alone 3. Pain Management: Tylenol as needed. Denies pain 4. Mood: Lexapro 10 mg daily.  Provide emotional support  -antipsychotic agents: N/A 5. Neuropsych: This patient is capable of making decisions on his own behalf. 6. Skin/Wound Care: Routine skin checks 7. Fluids/Electrolytes/Nutrition: Routine in and outs with weekly chemistries. Electrolytes stable on  11/13 8.  Seizure disorder.  Continue Lamictal 225 mg daily and 300 mg nightly 9.  Hyperlipidemia.  LDL 131 on 11/13. Continue Lipitor 10.  Hypothyroidism.  Synthroid  Lavon Paganini Angiulli, PA-C  I have personally performed a face to face diagnostic evaluation, including, but not limited to relevant history and physical exam findings, of this patient and developed relevant assessment and plan.  Additionally, I have reviewed and concur with the physician assistant's documentation above.  Leeroy Cha, MD

## 2020-09-30 NOTE — Progress Notes (Signed)
Report given to 4W CIR Nurse. Per CIR bed is not ready, and they will call back when it is.

## 2020-09-30 NOTE — Progress Notes (Signed)
Inpatient Rehabilitation Medication Review by a Pharmacist  A complete drug regimen review was completed for this patient to identify any potential clinically significant medication issues.  Clinically significant medication issues were identified:  No  Check AMION for pharmacist assigned to patient if future medication questions/issues arise during this admission.  Time spent performing this drug regimen review (minutes):  [place], PharmD, BCPS, [facility] Clinical Pharmacist 09/30/2020 6:35 PM

## 2020-09-30 NOTE — Progress Notes (Signed)
Inpatient Rehabilitation Admissions Coordinator  CIR/inpatient rehab bed is available today for his admission. I met with patient at bedside and spoke with his Mom by phone. They are in agreement. I have notified acute team and TOC. I will make the arrangements to admit today.  Danne Baxter, RN, MSN Rehab Admissions Coordinator [phone] 09/30/2020 10:03 AM

## 2020-09-30 NOTE — Progress Notes (Signed)
Pt arrived to 4W13 per bed with all belongings. Plan of care reviewed, medications reviewed, and policies reviewed. Pt in agreement, questions answered.  Sheela Stack, LPN

## 2020-09-30 NOTE — Progress Notes (Signed)
Howard Ribas, MD  Physician  Physical Medicine and Rehabilitation  Consult Note      Signed  Date of Service:  09/29/2020  5:10 AM      Related encounter: ED to Hosp-Admission (Current) from 09/26/2020 in [place] [facility]      Signed      Expand All Collapse All  Show:Clear all [x] Manual[x] Template[] Copied  Added by: [x] Angiulli, Lavon Paganini, PA-C[x] Raulkar, [name], MD  [] Hover for details          Physical Medicine and Rehabilitation Consult Reason for Consult: Left side weakness and facial droop Referring Physician: [facility]   HPI: Howard White is a 59 y.o. 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  Per chart review patient lives with elderly parents.  Reportedly independent prior to admission.  Patient with bed and bath on main level.  Presented 09/26/2020 with left-sided weakness and facial droop.  Cranial CT scan showed no acute intracranial abnormality or significant interval change.  Left frontal craniotomy with resection cavity noted.  CT angiogram of head and neck without large vessel occlusion or limiting proximal stenosis.  MRI of the brain showed a 1.9 x 1 2.  Centimeter acute infarct extending from the right corona radiata into the right basal ganglia.  Echocardiogram with ejection fraction of 60 to 65% no wall motion abnormalities.  Admission chemistries unremarkable except potassium 5.5 glucose 101.  Currently maintained on aspirin and Plavix for CVA prophylaxis.  Tolerating a regular diet.  Therapy evaluations completed with recommendations of physical medicine rehab consult.   Review of Systems  Constitutional: Negative for chills and fever.  HENT: Negative for hearing loss.   Eyes: Negative for blurred vision and double vision.  Respiratory: Negative for cough and shortness of  breath.   Cardiovascular: Negative for chest pain and palpitations.  Gastrointestinal: Positive for constipation. Negative for heartburn, nausea and vomiting.  Genitourinary: Negative for dysuria, flank pain and hematuria.  Musculoskeletal: Positive for joint pain and myalgias.  Skin: Negative for rash.  Neurological: Positive for weakness.  Psychiatric/Behavioral: Positive for depression. The patient has insomnia.   All other systems reviewed and are negative.       Past Medical History:  Diagnosis Date  . Brain cancer ([place])    . Seizures ([place])    . Thyroid disease      Hypothyroid         Past Surgical History:  Procedure Laterality Date  . brain cancer      . BRAIN SURGERY             Family History  Problem Relation Age of Onset  . Brain cancer Unknown    . Thyroid disease Unknown    . Cancer Unknown    . Diabetes Unknown    . Stroke Unknown      Social History:  reports that he has never smoked. He has never used smokeless tobacco. He reports current alcohol use. He reports that he does not use drugs. Allergies:      Allergies  Allergen Reactions  . Lactose Intolerance (Gi)            Medications Prior to Admission  Medication Sig Dispense Refill  . acetaminophen (TYLENOL) 500 MG tablet Take 1,000 mg by mouth every 6 (six) hours as needed for mild pain.      Marland Kitchen  escitalopram (LEXAPRO) 10 MG tablet Take 10 mg by mouth daily.      . famotidine (PEPCID) 20 MG tablet Take 20 mg by mouth daily.      . fluticasone (FLONASE) 50 MCG/ACT nasal spray Place 2 sprays into both nostrils daily as needed for allergies.       Marland Kitchen lamoTRIgine (LAMICTAL) 150 MG tablet TAKE 1 AND 1/2 TABS EVERY MORNING AND 2 TAB EVERY EVENING (Patient taking differently: Take 225-300 mg by mouth See admin instructions. TAKE 1 AND 1/2 TABS ( 225mg )  EVERY MORNING AND 2 TAB (300mg ) EVERY EVENING) 315 tablet 3  . Multiple Vitamins-Minerals (MULTIVITAMIN WITH MINERALS) tablet Take 1 tablet by mouth daily.       Marland Kitchen SYNTHROID 112 MCG tablet Take 112 mcg by mouth at bedtime.           Home: Home Living Family/patient expects to be discharged to:: Private residence Living Arrangements: Parent (parents) Available Help at Discharge: Family, Available 24 hours/day Type of Home: House Home Access: Level entry Home Layout: Two level, Able to live on main level with bedroom/bathroom Alternate Level Stairs-Number of Steps: flight Alternate Level Stairs-Rails: Right Bathroom Shower/Tub: Multimedia programmer: Standard Home Equipment: Hand held shower head  Lives With: Family  Functional History: Prior Function Level of Independence: Independent Comments: retired due to Animal nutritionist, indep with ADLs Functional Status:  Mobility: Bed Mobility Overal bed mobility: Needs Assistance Bed Mobility: Supine to Sit, Sit to Supine Supine to sit: HOB elevated, Max assist Sit to supine: Mod assist General bed mobility comments: assist for LLE and trunk elevation with VCs for sequencing through task, assist to scoot hips towards EOB; assist to support trunk when returning to supine, pt able to pull himself towards [facility] with minA with directional cues/instruction  Transfers Overall transfer level: Needs assistance Equipment used:  (front to front transfer with gait belt) Transfers: Sit to/from Stand, Stand Pivot Transfers Sit to Stand: Mod assist Stand pivot transfers: Max assist General transfer comment: requires use of momentum with assist to power up to stand. VCs for upright posture. pt already had been OOB to recliner and recently back to bed so deferred further transfer attempts  Ambulation/Gait General Gait Details: able to transfer to chair, deferred amb due to needing second person for safety   ADL: ADL Overall ADL's : Needs assistance/impaired Eating/Feeding: Minimal assistance, Sitting Grooming: Min guard, Sitting, Wash/dry face Grooming Details (indicate cue type and reason):  seated EOB Upper Body Bathing: Minimal assistance, Sitting Lower Body Bathing: Maximal assistance, Sit to/from stand Upper Body Dressing : Moderate assistance, Sitting Lower Body Dressing: Maximal assistance, Sit to/from stand Lower Body Dressing Details (indicate cue type and reason): modA for sit<>stand  Toileting- Clothing Manipulation and Hygiene: Total assistance, Sit to/from stand Functional mobility during ADLs: Moderate assistance (sit<>stand )   Cognition: Cognition Overall Cognitive Status: Impaired/Different from baseline Arousal/Alertness: Awake/alert Orientation Level: Oriented to person, Oriented to place, Oriented to situation, Oriented to time Attention: Focused, Sustained Focused Attention: Appears intact Sustained Attention: Appears intact Memory: Impaired Memory Impairment: Decreased short term memory Decreased Short Term Memory: Verbal basic Awareness: Appears intact Problem Solving: Impaired Problem Solving Impairment: Verbal basic, Verbal complex, Functional basic, Functional complex Executive Function: Reasoning, Organizing Reasoning: Impaired Reasoning Impairment: Verbal complex Organizing: Impaired Organizing Impairment: Verbal complex Safety/Judgment: Appears intact Cognition Arousal/Alertness: Awake/alert Behavior During Therapy: WFL for tasks assessed/performed, Impulsive Overall Cognitive Status: Impaired/Different from baseline Area of Impairment: Problem solving, Memory, Following commands, Safety/judgement, Awareness, Attention  Current Attention Level: Selective Memory: Decreased short-term memory Following Commands: Follows one step commands with increased time Safety/Judgement: Decreased awareness of safety, Decreased awareness of deficits Awareness: Emergent Problem Solving: Slow processing, Difficulty sequencing, Requires verbal cues, Requires tactile cues General Comments: pt mildly impulsive and with difficulty sequencing/motor planning  mobility tasks, requires cues to do so and for safety; pt repeating some questions asked during session and unaware (how long OT had been working for)    Blood pressure 125/78, pulse 69, temperature 98.5 F (36.9 C), temperature source Oral, resp. rate 19, height 5\' 10"  (1.778 m), weight 86.6 kg, SpO2 95 %. Physical Exam General: Alert, No apparent distress HEENT: Head is normocephalic, left facial droop  Neck: Supple without JVD or lymphadenopathy Heart: Reg rate and rhythm. No murmurs rubs or gallops Chest: CTA bilaterally without wheezes, rales, or rhonchi; no distress Abdomen: Soft, non-tender, non-distended, bowel sounds positive. Extremities: No clubbing, cyanosis, or edema. Pulses are 2+ Skin: Clean and intact without signs of breakdown Neuro: Patient is alert in no acute distress.  Follows commands.  Oriented x3.  LUE: 3/5 SA, EE, 4/5 hand grip LLE: 3/5 HF, KE, 4/5 DF, PF Psych: Pt's affect is appropriate. Pt is cooperative   Lab Results Last 24 Hours  No results found for this or any previous visit (from the past 24 hour(s)).    Imaging Results (Last 48 hours)  CT Angio Head W or Wo Contrast   Result Date: 09/26/2020 CLINICAL DATA:  Left-sided facial deficits. Left arm and leg weakness. History of brain tumor and seizures. EXAM: CT ANGIOGRAPHY HEAD AND NECK CT PERFUSION BRAIN TECHNIQUE: Multidetector CT imaging of the head and neck was performed using the standard protocol during bolus administration of intravenous contrast. Multiplanar CT image reconstructions and MIPs were obtained to evaluate the vascular anatomy. Carotid stenosis measurements (when applicable) are obtained utilizing NASCET criteria, using the distal internal carotid diameter as the denominator. Multiphase CT imaging of the brain was performed following IV bolus contrast injection. Subsequent parametric perfusion maps were calculated using RAPID software. CONTRAST:  173mL OMNIPAQUE IOHEXOL 350 MG/ML SOLN  COMPARISON:  None. FINDINGS: CTA NECK FINDINGS Aortic arch: Standard 3 vessel aortic arch with widely patent arch vessel origins. Right carotid system: Patent without evidence of stenosis, dissection, or significant atherosclerosis. Left carotid system: Patent without evidence of stenosis, dissection, or significant atherosclerosis. Vertebral arteries: Patent without evidence of stenosis, dissection, or significant atherosclerosis. Mildly dominant right vertebral artery. Mildly limited assessment of the left vertebral artery origin due to motion artifact. Skeleton: Advanced disc degeneration at C4-5, C5-6, and C6-7. Moderate to severe neural foraminal stenosis on the left at C5-6 and on the right at C6-7 due to uncovertebral spurring. Other neck: No evidence of cervical lymphadenopathy or mass. Upper chest: Mild dependent atelectasis in the lung apices. Review of the MIP images confirms the above findings CTA HEAD FINDINGS Anterior circulation: The internal carotid arteries are patent from skull base to carotid termini with minimal nonstenotic plaque bilaterally. ACAs and MCAs are patent with mild branch vessel irregularity but no evidence of a proximal branch occlusion or flow limiting proximal stenosis. No aneurysm is identified. Posterior circulation: The intracranial vertebral arteries are widely patent to the basilar. Patent left PICA, right AICA, and bilateral SCA origins are identified. The basilar artery is widely patent. There is a fetal origin of the left PCA. Both PCAs are patent with branch vessel irregularity but no evidence of a flow limiting proximal stenosis. There is a severe  left P3 branch vessel stenosis. No aneurysm is identified. Venous sinuses: Patent. Anatomic variants: Fetal left PCA. Review of the MIP images confirms the above findings CT Brain Perfusion Findings: ASPECTS: 10 CBF (<30%) Volume: 0 mL Perfusion (Tmax>6.0s) volume: 0 mL Mismatch Volume: 0 mL Infarction Location: N/a IMPRESSION:  1. Intracranial atherosclerosis without large vessel occlusion or flow limiting proximal stenosis. 2. Widely patent cervical carotid and vertebral arteries allowing for motion through the left vertebral origin. 3. No evidence of acute ischemia on CTP. These results were communicated to Dr. Theda Sers at 2:49 pm on 09/26/2020 by text page via the [facility] messaging system. Electronically Signed   By: Logan Bores M.D.   On: 09/26/2020 14:56    CT Angio Neck W and/or Wo Contrast   Result Date: 09/26/2020 CLINICAL DATA:  Left-sided facial deficits. Left arm and leg weakness. History of brain tumor and seizures. EXAM: CT ANGIOGRAPHY HEAD AND NECK CT PERFUSION BRAIN TECHNIQUE: Multidetector CT imaging of the head and neck was performed using the standard protocol during bolus administration of intravenous contrast. Multiplanar CT image reconstructions and MIPs were obtained to evaluate the vascular anatomy. Carotid stenosis measurements (when applicable) are obtained utilizing NASCET criteria, using the distal internal carotid diameter as the denominator. Multiphase CT imaging of the brain was performed following IV bolus contrast injection. Subsequent parametric perfusion maps were calculated using RAPID software. CONTRAST:  154mL OMNIPAQUE IOHEXOL 350 MG/ML SOLN COMPARISON:  None. FINDINGS: CTA NECK FINDINGS Aortic arch: Standard 3 vessel aortic arch with widely patent arch vessel origins. Right carotid system: Patent without evidence of stenosis, dissection, or significant atherosclerosis. Left carotid system: Patent without evidence of stenosis, dissection, or significant atherosclerosis. Vertebral arteries: Patent without evidence of stenosis, dissection, or significant atherosclerosis. Mildly dominant right vertebral artery. Mildly limited assessment of the left vertebral artery origin due to motion artifact. Skeleton: Advanced disc degeneration at C4-5, C5-6, and C6-7. Moderate to severe neural foraminal stenosis  on the left at C5-6 and on the right at C6-7 due to uncovertebral spurring. Other neck: No evidence of cervical lymphadenopathy or mass. Upper chest: Mild dependent atelectasis in the lung apices. Review of the MIP images confirms the above findings CTA HEAD FINDINGS Anterior circulation: The internal carotid arteries are patent from skull base to carotid termini with minimal nonstenotic plaque bilaterally. ACAs and MCAs are patent with mild branch vessel irregularity but no evidence of a proximal branch occlusion or flow limiting proximal stenosis. No aneurysm is identified. Posterior circulation: The intracranial vertebral arteries are widely patent to the basilar. Patent left PICA, right AICA, and bilateral SCA origins are identified. The basilar artery is widely patent. There is a fetal origin of the left PCA. Both PCAs are patent with branch vessel irregularity but no evidence of a flow limiting proximal stenosis. There is a severe left P3 branch vessel stenosis. No aneurysm is identified. Venous sinuses: Patent. Anatomic variants: Fetal left PCA. Review of the MIP images confirms the above findings CT Brain Perfusion Findings: ASPECTS: 10 CBF (<30%) Volume: 0 mL Perfusion (Tmax>6.0s) volume: 0 mL Mismatch Volume: 0 mL Infarction Location: N/a IMPRESSION: 1. Intracranial atherosclerosis without large vessel occlusion or flow limiting proximal stenosis. 2. Widely patent cervical carotid and vertebral arteries allowing for motion through the left vertebral origin. 3. No evidence of acute ischemia on CTP. These results were communicated to Dr. Theda Sers at 2:49 pm on 09/26/2020 by text page via the [facility] messaging system. Electronically Signed   By: Seymour Bars.D.  On: 09/26/2020 14:56    MR BRAIN WO CONTRAST   Result Date: 09/26/2020 CLINICAL DATA:  Neuro deficit, acute, stroke suspected. Left-sided weakness. EXAM: MRI HEAD WITHOUT CONTRAST TECHNIQUE: Multiplanar, multiecho pulse sequences of the brain and  surrounding structures were obtained without intravenous contrast. COMPARISON:  Noncontrast head CT, CT angiogram head/neck and CT perfusion performed earlier the same day 09/26/2020. Brain MRI 06/23/2020. FINDINGS: Brain: 1.9 x 0.5 x 1.2 cm (AP x TV x CC) focus of restricted diffusion extending from the right corona radiata inferiorly into the right basal ganglia, consistent with acute infarction. Large resection cavity within the left frontal lobe with a small amount of chronic blood products at the periphery. Associated ex vacuo dilatation of the left lateral ventricle. Redemonstrated extensive confluent T2/FLAIR hyperintense signal abnormality throughout the bilateral cerebral white matter. Chronic small vessel ischemic changes within the deep gray nuclei. No non-contrast evidence of intracranial mass. No extra-axial fluid collection. No midline shift. Vascular: Expected proximal arterial flow voids. Skull and upper cervical spine: Left frontoparietal cranioplasty. No focal marrow lesion. Sinuses/Orbits: Visualized orbits show no acute finding. No significant paranasal sinus disease. Trace fluid within the left mastoid air cells. IMPRESSION: 1.9 x 1.2 cm acute infarct extending from the right corona radiata into the right basal ganglia. Chronic findings which are stable as compared to the MRI of 06/23/2020, as follows. Prior left frontoparietal craniotomy with left frontal lobe resection cavity. Extensive T2 hyperintense signal abnormality throughout the cerebral white matter, nonspecific but likely reflecting a combination of post-treatment changes and chronic small vessel ischemic disease. Chronic small vessel ischemic changes are also present within the deep gray nuclei. Electronically Signed   By: Kellie Simmering DO   On: 09/26/2020 18:05    CT CEREBRAL PERFUSION W CONTRAST   Result Date: 09/26/2020 CLINICAL DATA:  Left-sided facial deficits. Left arm and leg weakness. History of brain tumor and seizures.  EXAM: CT ANGIOGRAPHY HEAD AND NECK CT PERFUSION BRAIN TECHNIQUE: Multidetector CT imaging of the head and neck was performed using the standard protocol during bolus administration of intravenous contrast. Multiplanar CT image reconstructions and MIPs were obtained to evaluate the vascular anatomy. Carotid stenosis measurements (when applicable) are obtained utilizing NASCET criteria, using the distal internal carotid diameter as the denominator. Multiphase CT imaging of the brain was performed following IV bolus contrast injection. Subsequent parametric perfusion maps were calculated using RAPID software. CONTRAST:  120mL OMNIPAQUE IOHEXOL 350 MG/ML SOLN COMPARISON:  None. FINDINGS: CTA NECK FINDINGS Aortic arch: Standard 3 vessel aortic arch with widely patent arch vessel origins. Right carotid system: Patent without evidence of stenosis, dissection, or significant atherosclerosis. Left carotid system: Patent without evidence of stenosis, dissection, or significant atherosclerosis. Vertebral arteries: Patent without evidence of stenosis, dissection, or significant atherosclerosis. Mildly dominant right vertebral artery. Mildly limited assessment of the left vertebral artery origin due to motion artifact. Skeleton: Advanced disc degeneration at C4-5, C5-6, and C6-7. Moderate to severe neural foraminal stenosis on the left at C5-6 and on the right at C6-7 due to uncovertebral spurring. Other neck: No evidence of cervical lymphadenopathy or mass. Upper chest: Mild dependent atelectasis in the lung apices. Review of the MIP images confirms the above findings CTA HEAD FINDINGS Anterior circulation: The internal carotid arteries are patent from skull base to carotid termini with minimal nonstenotic plaque bilaterally. ACAs and MCAs are patent with mild branch vessel irregularity but no evidence of a proximal branch occlusion or flow limiting proximal stenosis. No aneurysm is identified. Posterior circulation:  The  intracranial vertebral arteries are widely patent to the basilar. Patent left PICA, right AICA, and bilateral SCA origins are identified. The basilar artery is widely patent. There is a fetal origin of the left PCA. Both PCAs are patent with branch vessel irregularity but no evidence of a flow limiting proximal stenosis. There is a severe left P3 branch vessel stenosis. No aneurysm is identified. Venous sinuses: Patent. Anatomic variants: Fetal left PCA. Review of the MIP images confirms the above findings CT Brain Perfusion Findings: ASPECTS: 10 CBF (<30%) Volume: 0 mL Perfusion (Tmax>6.0s) volume: 0 mL Mismatch Volume: 0 mL Infarction Location: N/a IMPRESSION: 1. Intracranial atherosclerosis without large vessel occlusion or flow limiting proximal stenosis. 2. Widely patent cervical carotid and vertebral arteries allowing for motion through the left vertebral origin. 3. No evidence of acute ischemia on CTP. These results were communicated to Dr. Theda Sers at 2:49 pm on 09/26/2020 by text page via the [facility] messaging system. Electronically Signed   By: Logan Bores M.D.   On: 09/26/2020 14:56    ECHOCARDIOGRAM COMPLETE   Result Date: 09/27/2020    ECHOCARDIOGRAM REPORT   Patient Name:   JAHZIEL SINN Date of Exam: 09/27/2020 Medical Rec #:  [id number]    Height:       70.0 in Accession #:    [id number]   Weight:       190.9 lb Date of Birth:  06-23-1961   BSA:          2.047 m Patient Age:    87 years     BP:           110/71 mmHg Patient Gender: M            HR:           78 bpm. Exam Location:  Inpatient Procedure: 2D Echo, Cardiac Doppler and Color Doppler Indications:    TIA 435.9 / G45.9  History:        Patient has no prior history of Echocardiogram examinations.  Sonographer:    Bernadene Person RDCS Referring Phys: [id number] [place]  1. Left ventricular ejection fraction, by estimation, is 60 to 65%. The left ventricle has normal function. The left ventricle has no regional wall  motion abnormalities. Left ventricular diastolic parameters are indeterminate.  2. Right ventricular systolic function is normal. The right ventricular size is normal. There is normal pulmonary artery systolic pressure.  3. The mitral valve is normal in structure. No evidence of mitral valve regurgitation. No evidence of mitral stenosis.  4. The aortic valve is normal in structure. Aortic valve regurgitation is not visualized. No aortic stenosis is present.  5. The inferior vena cava is normal in size with greater than 50% respiratory variability, suggesting right atrial pressure of 3 mmHg. FINDINGS  Left Ventricle: Left ventricular ejection fraction, by estimation, is 60 to 65%. The left ventricle has normal function. The left ventricle has no regional wall motion abnormalities. The left ventricular internal cavity size was normal in size. There is  no left ventricular hypertrophy. Left ventricular diastolic parameters are indeterminate. Right Ventricle: The right ventricular size is normal. No increase in right ventricular wall thickness. Right ventricular systolic function is normal. There is normal pulmonary artery systolic pressure. The tricuspid regurgitant velocity is 1.82 m/s, and  with an assumed right atrial pressure of 8 mmHg, the estimated right ventricular systolic pressure is 96.7 mmHg. Left Atrium: Left atrial size was normal in size. Right Atrium: Right atrial size  was normal in size. Pericardium: There is no evidence of pericardial effusion. Mitral Valve: The mitral valve is normal in structure. No evidence of mitral valve regurgitation. No evidence of mitral valve stenosis. Tricuspid Valve: The tricuspid valve is normal in structure. Tricuspid valve regurgitation is not demonstrated. No evidence of tricuspid stenosis. Aortic Valve: The aortic valve is normal in structure. Aortic valve regurgitation is not visualized. No aortic stenosis is present. Pulmonic Valve: The pulmonic valve was normal in  structure. Pulmonic valve regurgitation is not visualized. No evidence of pulmonic stenosis. Aorta: The aortic root is normal in size and structure. Venous: The inferior vena cava is normal in size with greater than 50% respiratory variability, suggesting right atrial pressure of 3 mmHg. IAS/Shunts: No atrial level shunt detected by color flow Doppler.  LEFT VENTRICLE PLAX 2D LVIDd:         4.70 cm  Diastology LVIDs:         3.10 cm  LV e' medial:    5.52 cm/s LV PW:         0.90 cm  LV E/e' medial:  9.9 LV IVS:        1.10 cm  LV e' lateral:   8.85 cm/s LVOT diam:     2.20 cm  LV E/e' lateral: 6.2 LV SV:         64 LV SV Index:   31 LVOT Area:     3.80 cm  RIGHT VENTRICLE RV S prime:     18.00 cm/s TAPSE (M-mode): 2.4 cm LEFT ATRIUM             Index       RIGHT ATRIUM           Index LA diam:        3.40 cm 1.66 cm/m  RA Area:     13.80 cm LA Vol (A2C):   33.8 ml 16.52 ml/m RA Volume:   29.90 ml  14.61 ml/m LA Vol (A4C):   42.9 ml 20.96 ml/m LA Biplane Vol: 41.7 ml 20.38 ml/m  AORTIC VALVE LVOT Vmax:   92.00 cm/s LVOT Vmean:  66.600 cm/s LVOT VTI:    0.168 m  AORTA Ao Root diam: 3.50 cm Ao Asc diam:  3.30 cm MITRAL VALVE               TRICUSPID VALVE MV Area (PHT): 2.95 cm    TR Peak grad:   13.2 mmHg MV Decel Time: 257 msec    TR Vmax:        182.00 cm/s MV E velocity: 54.80 cm/s MV A velocity: 57.90 cm/s  SHUNTS MV E/A ratio:  0.95        Systemic VTI:  0.17 m                            Systemic Diam: 2.20 cm Dani Gobble Croitoru MD Electronically signed by Sanda Klein MD Signature Date/Time: 09/27/2020/12:50:34 PM    Final     CT HEAD CODE STROKE WO CONTRAST   Result Date: 09/26/2020 CLINICAL DATA:  Code stroke. Left-sided facial deficits. Left arm and leg weakness. Personal history of brain tumor and seizures. EXAM: CT HEAD WITHOUT CONTRAST TECHNIQUE: Contiguous axial images were obtained from the base of the skull through the vertex without intravenous contrast. COMPARISON:  MR head without and with  contrast 06/23/2020 and 04/21/2018. FINDINGS: Brain: Left frontal craniotomy again noted. Surgical cavity evident. Diffuse white  matter disease is again seen. Remote lacunar infarct is present in the right basal ganglia. No significant changes present. Insular ribbon is normal bilaterally. Cortex is unremarkable. Brainstem and cerebellum are with in normal limits. Vascular: No hyperdense vessel or unexpected calcification. Skull: Left frontal craniotomy noted. Calvarium is otherwise intact. No focal lytic or blastic lesions are present. No significant extracranial soft tissue lesion is present. Sinuses/Orbits: The paranasal sinuses and mastoid air cells are clear. The globes and orbits are within normal limits. ASPECTS [facility] Stroke Program Early CT Score) - Ganglionic level infarction (caudate, lentiform nuclei, internal capsule, insula, M1-M3 cortex): 7/7 - Supraganglionic infarction (M4-M6 cortex): 3/3 Total score (0-10 with 10 being normal): 10/10 IMPRESSION: 1. No acute intracranial abnormality or significant interval change. 2. Stable atrophy and white matter disease. 3. Left frontal craniotomy with resection cavity. 4. Aspects 10/10 5. The above was relayed via text pager to Dr. Theda Sers on 09/26/2020 at 14:32 . Electronically Signed   By: San Morelle M.D.   On: 09/26/2020 14:33       Assessment/Plan: Diagnosis: right basal ganglia/corona radiata infarct 1. Does the need for close, 24 hr/day medical supervision in concert with the patient's rehab needs make it unreasonable for this patient to be served in a less intensive setting? Yes 2. 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 Due to bladder management, bowel management, safety, skin/wound care, disease management, medication administration, pain management and patient education, does  the patient require 24 hr/day rehab nursing? Yes 4. Does the patient require coordinated care of a physician, rehab nurse, therapy disciplines of PT, OT, SLP to address physical and functional deficits in the context of the above medical diagnosis(es)? Yes Addressing deficits in the following areas: balance, endurance, locomotion, strength, transferring, bowel/bladder control, bathing, dressing, feeding, grooming, toileting, speech and psychosocial support 5. Can the patient actively participate in an intensive therapy program of at least 3 hrs of therapy per day at least 5 days per week? Yes 6. The potential for patient to make measurable gains while on inpatient rehab is excellent 7. Anticipated functional outcomes upon discharge from inpatient rehab are modified independent  with PT, modified independent with OT, independent with SLP. 8. Estimated rehab length of stay to reach the above functional goals is: 12-16 days 9. Anticipated discharge destination: Home 10. Overall Rehab/Functional Prognosis: excellent   RECOMMENDATIONS: This patient's condition is appropriate for continued rehabilitative care in the following setting: CIR Patient has agreed to participate in recommended program. Yes Note that insurance prior authorization may be required for reimbursement for recommended care.   Comment: Thank you for this consult. Admission coordinator to follow.    I have personally performed a face to face diagnostic evaluation, including, but not limited to relevant history and physical exam findings, of this patient and developed relevant assessment and plan.  Additionally, I have reviewed and concur with the physician assistant's documentation above.   Leeroy Cha, MD   Cathlyn Parsons, PA-C 09/28/2020        Revision History                     Routing History           Note Details  Author Ranell Patrick, [name], MD File Time 09/29/2020 12:17 PM  Author Type Physician Status  Signed  Last Editor Howard Ribas, MD Service Physical Medicine and Rehabilitation

## 2020-09-30 NOTE — Discharge Summary (Signed)
Physician Discharge Summary  Howard White GDJ:[id number] DOB: 03-Dec-1960 DOA: 09/26/2020  PCP: Seward Carol, MD  Admit date: 09/26/2020 Discharge date: 09/30/2020  Admitted From: Home Disposition: CIR  Recommendations for Outpatient Follow-up:  1. Follow up with PCP in 1-2 weeks 2. Follow-up with neurology in 4 to 6 weeks 3. Please obtain BMP/CBC in one week 4. Please follow up with your PCP on the following pending results: Unresulted Labs (From admission, onward)          Start     Ordered   Signed and Held  Comprehensive metabolic panel  Tomorrow morning,   R       Question:  Specimen collection method  Answer:  Lab=Lab collect   Signed and Held   Signed and Held  CBC WITH DIFFERENTIAL  Tomorrow morning,   R       Question:  Specimen collection method  Answer:  Lab=Lab collect   Signed and [place]: None Equipment/Devices: None  Discharge Condition: Stable CODE STATUS: Full code Diet recommendation: Cardiac  Subjective: Seen and examined.  He has no complaints.  He is getting stronger and his strength is coming back.  Excited to go to CIR.  Mother at the bedside.  Brief/Interim Summary: Howard White a 59 y.o.malewith medical history significant ofbrain cancer, seizures and hypothyroidism. Patient has been at his usual state of health until around 1:30 PM on 09/26/2020 when he noticed left sided weakness and left facial droop. His left-sided weakness is mainly affecting his left upper extremity and left face. He has been seizure-free for about 5 years, a recent neurologic follow-up 4 months ago was normal. When he was diagnosed with his brain cancer he was symptomatic on the right side of his body but this time presented with left-sided weakness. MRI brain shows 1.9 x 1.2 cm acute infarct extending from the right coronary radiator into the right basal ganglia.  Extensive T2 hyperintense signal abnormality throughout the cerebral white matter.  CT  cerebral perfusion and CT angio of head and neck showed intracranial atherosclerosis without LVO.  Echo did not show any PFO or any thrombus.  Admitted to hospital service.  Neurology consulted.  Started on aspirin and Plavix.  Seen by PT OT who recommended CIR. he is being discharged in stable condition to CIR today.  Will continue DAPT for total of 3 weeks and then stop Plavix and continue aspirin after that.  He is also being discharged on atorvastatin for mild hyperlipidemia.  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 Thanks.     Allergies as of 09/30/2020      Reactions   Lactose Intolerance (gi)       Medication List    TAKE these medications   acetaminophen 500 MG tablet Commonly known as: TYLENOL Take 1,000 mg by mouth every 6 (six) hours as needed for mild pain.   aspirin 81 MG EC tablet Take 1 tablet (81 mg total) by mouth daily.  Swallow whole. Start taking on: October 01, 2020   atorvastatin 40 MG tablet Commonly known as: LIPITOR Take 1 tablet (40 mg total) by mouth daily. Start taking on: October 01, 2020   clopidogrel 75 MG tablet Commonly known as: PLAVIX Take 1 tablet (75 mg total) by mouth daily for 18 days. Start taking on: October 01, 2020   escitalopram 10 MG tablet Commonly known as: LEXAPRO Take 10 mg by mouth daily.   famotidine 20 MG tablet Commonly known as: PEPCID Take 20 mg by mouth daily.   fluticasone 50 MCG/ACT nasal spray Commonly known as: FLONASE Place 2 sprays into both nostrils daily as  needed for allergies.   lamoTRIgine 150 MG tablet Commonly known as: LAMICTAL TAKE 1 AND 1/2 TABS EVERY MORNING AND 2 TAB EVERY EVENING What changed:   how much to take  how to take this  when to take this  additional instructions   multivitamin with minerals tablet Take 1 tablet by mouth daily.   Synthroid 112 MCG tablet Generic drug: levothyroxine Take 112 mcg by mouth at bedtime.       Follow-up Information    [facility]. Schedule an appointment as soon as possible for a visit in 4 week(s).   Specialty: Neurology Contact information: [address] [place] [phone]       Seward Carol, MD Follow up in 1 week(s).   Specialty: Internal Medicine Contact information: [address] [place] [address] [phone]              Allergies  Allergen Reactions  . Lactose Intolerance (Gi)     Consultations: Neurology   Procedures/Studies: CT Angio Head W or Wo Contrast  Result Date: 09/26/2020 CLINICAL DATA:  Left-sided facial deficits. Left arm and leg weakness. History of brain tumor and seizures. EXAM: CT ANGIOGRAPHY HEAD AND NECK CT PERFUSION BRAIN TECHNIQUE: Multidetector CT imaging of the head and neck was performed using the standard protocol during bolus administration of intravenous contrast. Multiplanar CT image reconstructions and MIPs were obtained to evaluate the vascular anatomy. Carotid stenosis measurements (when applicable) are obtained utilizing NASCET criteria, using the distal internal carotid diameter as the denominator. Multiphase CT imaging of the brain was performed following IV bolus contrast injection. Subsequent parametric perfusion maps were calculated using RAPID software. CONTRAST:  155mL OMNIPAQUE IOHEXOL 350 MG/ML SOLN COMPARISON:  None. FINDINGS: CTA NECK FINDINGS Aortic arch: Standard 3 vessel aortic arch with widely patent arch vessel origins. Right  carotid system: Patent without evidence of stenosis, dissection, or significant atherosclerosis. Left carotid system: Patent without evidence of stenosis, dissection, or significant atherosclerosis. Vertebral arteries: Patent without evidence of stenosis, dissection, or significant atherosclerosis. Mildly dominant right vertebral artery. Mildly limited assessment of the left vertebral artery origin due to motion artifact. Skeleton: Advanced disc degeneration at C4-5, C5-6, and C6-7. Moderate to severe neural foraminal stenosis on the left at C5-6 and on the right at C6-7 due to uncovertebral spurring. Other neck: No evidence of cervical lymphadenopathy or mass. Upper chest: Mild dependent atelectasis in the lung apices. Review of the MIP images confirms the above findings CTA HEAD FINDINGS Anterior circulation: The internal carotid arteries are patent from skull base to carotid termini with minimal nonstenotic plaque bilaterally. ACAs and MCAs are patent with mild branch vessel irregularity but no evidence of a proximal branch occlusion or flow limiting proximal stenosis. No aneurysm is identified. Posterior circulation: The intracranial vertebral arteries are widely  patent to the basilar. Patent left PICA, right AICA, and bilateral SCA origins are identified. The basilar artery is widely patent. There is a fetal origin of the left PCA. Both PCAs are patent with branch vessel irregularity but no evidence of a flow limiting proximal stenosis. There is a severe left P3 branch vessel stenosis. No aneurysm is identified. Venous sinuses: Patent. Anatomic variants: Fetal left PCA. Review of the MIP images confirms the above findings CT Brain Perfusion Findings: ASPECTS: 10 CBF (<30%) Volume: 0 mL Perfusion (Tmax>6.0s) volume: 0 mL Mismatch Volume: 0 mL Infarction Location: N/a IMPRESSION: 1. Intracranial atherosclerosis without large vessel occlusion or flow limiting proximal stenosis. 2. Widely patent cervical carotid  and vertebral arteries allowing for motion through the left vertebral origin. 3. No evidence of acute ischemia on CTP. These results were communicated to Dr. Theda Sers at 2:49 pm on 09/26/2020 by text page via the [facility] messaging system. Electronically Signed   By: Logan Bores M.D.   On: 09/26/2020 14:56   CT Angio Neck W and/or Wo Contrast  Result Date: 09/26/2020 CLINICAL DATA:  Left-sided facial deficits. Left arm and leg weakness. History of brain tumor and seizures. EXAM: CT ANGIOGRAPHY HEAD AND NECK CT PERFUSION BRAIN TECHNIQUE: Multidetector CT imaging of the head and neck was performed using the standard protocol during bolus administration of intravenous contrast. Multiplanar CT image reconstructions and MIPs were obtained to evaluate the vascular anatomy. Carotid stenosis measurements (when applicable) are obtained utilizing NASCET criteria, using the distal internal carotid diameter as the denominator. Multiphase CT imaging of the brain was performed following IV bolus contrast injection. Subsequent parametric perfusion maps were calculated using RAPID software. CONTRAST:  153mL OMNIPAQUE IOHEXOL 350 MG/ML SOLN COMPARISON:  None. FINDINGS: CTA NECK FINDINGS Aortic arch: Standard 3 vessel aortic arch with widely patent arch vessel origins. Right carotid system: Patent without evidence of stenosis, dissection, or significant atherosclerosis. Left carotid system: Patent without evidence of stenosis, dissection, or significant atherosclerosis. Vertebral arteries: Patent without evidence of stenosis, dissection, or significant atherosclerosis. Mildly dominant right vertebral artery. Mildly limited assessment of the left vertebral artery origin due to motion artifact. Skeleton: Advanced disc degeneration at C4-5, C5-6, and C6-7. Moderate to severe neural foraminal stenosis on the left at C5-6 and on the right at C6-7 due to uncovertebral spurring. Other neck: No evidence of cervical lymphadenopathy or mass.  Upper chest: Mild dependent atelectasis in the lung apices. Review of the MIP images confirms the above findings CTA HEAD FINDINGS Anterior circulation: The internal carotid arteries are patent from skull base to carotid termini with minimal nonstenotic plaque bilaterally. ACAs and MCAs are patent with mild branch vessel irregularity but no evidence of a proximal branch occlusion or flow limiting proximal stenosis. No aneurysm is identified. Posterior circulation: The intracranial vertebral arteries are widely patent to the basilar. Patent left PICA, right AICA, and bilateral SCA origins are identified. The basilar artery is widely patent. There is a fetal origin of the left PCA. Both PCAs are patent with branch vessel irregularity but no evidence of a flow limiting proximal stenosis. There is a severe left P3 branch vessel stenosis. No aneurysm is identified. Venous sinuses: Patent. Anatomic variants: Fetal left PCA. Review of the MIP images confirms the above findings CT Brain Perfusion Findings: ASPECTS: 10 CBF (<30%) Volume: 0 mL Perfusion (Tmax>6.0s) volume: 0 mL Mismatch Volume: 0 mL Infarction Location: N/a IMPRESSION: 1. Intracranial atherosclerosis without large vessel occlusion or flow limiting proximal stenosis. 2. Widely patent cervical carotid  and vertebral arteries allowing for motion through the left vertebral origin. 3. No evidence of acute ischemia on CTP. These results were communicated to Dr. Theda Sers at 2:49 pm on 09/26/2020 by text page via the [facility] messaging system. Electronically Signed   By: Logan Bores M.D.   On: 09/26/2020 14:56   MR BRAIN WO CONTRAST  Result Date: 09/26/2020 CLINICAL DATA:  Neuro deficit, acute, stroke suspected. Left-sided weakness. EXAM: MRI HEAD WITHOUT CONTRAST TECHNIQUE: Multiplanar, multiecho pulse sequences of the brain and surrounding structures were obtained without intravenous contrast. COMPARISON:  Noncontrast head CT, CT angiogram head/neck and CT  perfusion performed earlier the same day 09/26/2020. Brain MRI 06/23/2020. FINDINGS: Brain: 1.9 x 0.5 x 1.2 cm (AP x TV x CC) focus of restricted diffusion extending from the right corona radiata inferiorly into the right basal ganglia, consistent with acute infarction. Large resection cavity within the left frontal lobe with a small amount of chronic blood products at the periphery. Associated ex vacuo dilatation of the left lateral ventricle. Redemonstrated extensive confluent T2/FLAIR hyperintense signal abnormality throughout the bilateral cerebral white matter. Chronic small vessel ischemic changes within the deep gray nuclei. No non-contrast evidence of intracranial mass. No extra-axial fluid collection. No midline shift. Vascular: Expected proximal arterial flow voids. Skull and upper cervical spine: Left frontoparietal cranioplasty. No focal marrow lesion. Sinuses/Orbits: Visualized orbits show no acute finding. No significant paranasal sinus disease. Trace fluid within the left mastoid air cells. IMPRESSION: 1.9 x 1.2 cm acute infarct extending from the right corona radiata into the right basal ganglia. Chronic findings which are stable as compared to the MRI of 06/23/2020, as follows. Prior left frontoparietal craniotomy with left frontal lobe resection cavity. Extensive T2 hyperintense signal abnormality throughout the cerebral white matter, nonspecific but likely reflecting a combination of post-treatment changes and chronic small vessel ischemic disease. Chronic small vessel ischemic changes are also present within the deep gray nuclei. Electronically Signed   By: Kellie Simmering DO   On: 09/26/2020 18:05   CT CEREBRAL PERFUSION W CONTRAST  Result Date: 09/26/2020 CLINICAL DATA:  Left-sided facial deficits. Left arm and leg weakness. History of brain tumor and seizures. EXAM: CT ANGIOGRAPHY HEAD AND NECK CT PERFUSION BRAIN TECHNIQUE: Multidetector CT imaging of the head and neck was performed using  the standard protocol during bolus administration of intravenous contrast. Multiplanar CT image reconstructions and MIPs were obtained to evaluate the vascular anatomy. Carotid stenosis measurements (when applicable) are obtained utilizing NASCET criteria, using the distal internal carotid diameter as the denominator. Multiphase CT imaging of the brain was performed following IV bolus contrast injection. Subsequent parametric perfusion maps were calculated using RAPID software. CONTRAST:  131mL OMNIPAQUE IOHEXOL 350 MG/ML SOLN COMPARISON:  None. FINDINGS: CTA NECK FINDINGS Aortic arch: Standard 3 vessel aortic arch with widely patent arch vessel origins. Right carotid system: Patent without evidence of stenosis, dissection, or significant atherosclerosis. Left carotid system: Patent without evidence of stenosis, dissection, or significant atherosclerosis. Vertebral arteries: Patent without evidence of stenosis, dissection, or significant atherosclerosis. Mildly dominant right vertebral artery. Mildly limited assessment of the left vertebral artery origin due to motion artifact. Skeleton: Advanced disc degeneration at C4-5, C5-6, and C6-7. Moderate to severe neural foraminal stenosis on the left at C5-6 and on the right at C6-7 due to uncovertebral spurring. Other neck: No evidence of cervical lymphadenopathy or mass. Upper chest: Mild dependent atelectasis in the lung apices. Review of the MIP images confirms the above findings CTA HEAD FINDINGS Anterior circulation:  The internal carotid arteries are patent from skull base to carotid termini with minimal nonstenotic plaque bilaterally. ACAs and MCAs are patent with mild branch vessel irregularity but no evidence of a proximal branch occlusion or flow limiting proximal stenosis. No aneurysm is identified. Posterior circulation: The intracranial vertebral arteries are widely patent to the basilar. Patent left PICA, right AICA, and bilateral SCA origins are identified.  The basilar artery is widely patent. There is a fetal origin of the left PCA. Both PCAs are patent with branch vessel irregularity but no evidence of a flow limiting proximal stenosis. There is a severe left P3 branch vessel stenosis. No aneurysm is identified. Venous sinuses: Patent. Anatomic variants: Fetal left PCA. Review of the MIP images confirms the above findings CT Brain Perfusion Findings: ASPECTS: 10 CBF (<30%) Volume: 0 mL Perfusion (Tmax>6.0s) volume: 0 mL Mismatch Volume: 0 mL Infarction Location: N/a IMPRESSION: 1. Intracranial atherosclerosis without large vessel occlusion or flow limiting proximal stenosis. 2. Widely patent cervical carotid and vertebral arteries allowing for motion through the left vertebral origin. 3. No evidence of acute ischemia on CTP. These results were communicated to Dr. Theda Sers at 2:49 pm on 09/26/2020 by text page via the [facility] messaging system. Electronically Signed   By: Logan Bores M.D.   On: 09/26/2020 14:56   ECHOCARDIOGRAM COMPLETE  Result Date: 09/27/2020    ECHOCARDIOGRAM REPORT   Patient Name:   ALICIA ACKERT Date of Exam: 09/27/2020 Medical Rec #:  [id number]    Height:       70.0 in Accession #:    [id number]   Weight:       190.9 lb Date of Birth:  August 26, 1961   BSA:          2.047 m Patient Age:    38 years     BP:           110/71 mmHg Patient Gender: M            HR:           78 bpm. Exam Location:  Inpatient Procedure: 2D Echo, Cardiac Doppler and Color Doppler Indications:    TIA 435.9 / G45.9  History:        Patient has no prior history of Echocardiogram examinations.  Sonographer:    Bernadene Person RDCS Referring Phys: [id number] [place]  1. Left ventricular ejection fraction, by estimation, is 60 to 65%. The left ventricle has normal function. The left ventricle has no regional wall motion abnormalities. Left ventricular diastolic parameters are indeterminate.  2. Right ventricular systolic function is normal. The right  ventricular size is normal. There is normal pulmonary artery systolic pressure.  3. The mitral valve is normal in structure. No evidence of mitral valve regurgitation. No evidence of mitral stenosis.  4. The aortic valve is normal in structure. Aortic valve regurgitation is not visualized. No aortic stenosis is present.  5. The inferior vena cava is normal in size with greater than 50% respiratory variability, suggesting right atrial pressure of 3 mmHg. FINDINGS  Left Ventricle: Left ventricular ejection fraction, by estimation, is 60 to 65%. The left ventricle has normal function. The left ventricle has no regional wall motion abnormalities. The left ventricular internal cavity size was normal in size. There is  no left ventricular hypertrophy. Left ventricular diastolic parameters are indeterminate. Right Ventricle: The right ventricular size is normal. No increase in right ventricular wall thickness. Right ventricular systolic function is normal. There is normal pulmonary  artery systolic pressure. The tricuspid regurgitant velocity is 1.82 m/s, and  with an assumed right atrial pressure of 8 mmHg, the estimated right ventricular systolic pressure is 28.3 mmHg. Left Atrium: Left atrial size was normal in size. Right Atrium: Right atrial size was normal in size. Pericardium: There is no evidence of pericardial effusion. Mitral Valve: The mitral valve is normal in structure. No evidence of mitral valve regurgitation. No evidence of mitral valve stenosis. Tricuspid Valve: The tricuspid valve is normal in structure. Tricuspid valve regurgitation is not demonstrated. No evidence of tricuspid stenosis. Aortic Valve: The aortic valve is normal in structure. Aortic valve regurgitation is not visualized. No aortic stenosis is present. Pulmonic Valve: The pulmonic valve was normal in structure. Pulmonic valve regurgitation is not visualized. No evidence of pulmonic stenosis. Aorta: The aortic root is normal in size and  structure. Venous: The inferior vena cava is normal in size with greater than 50% respiratory variability, suggesting right atrial pressure of 3 mmHg. IAS/Shunts: No atrial level shunt detected by color flow Doppler.  LEFT VENTRICLE PLAX 2D LVIDd:         4.70 cm  Diastology LVIDs:         3.10 cm  LV e' medial:    5.52 cm/s LV PW:         0.90 cm  LV E/e' medial:  9.9 LV IVS:        1.10 cm  LV e' lateral:   8.85 cm/s LVOT diam:     2.20 cm  LV E/e' lateral: 6.2 LV SV:         64 LV SV Index:   31 LVOT Area:     3.80 cm  RIGHT VENTRICLE RV S prime:     18.00 cm/s TAPSE (M-mode): 2.4 cm LEFT ATRIUM             Index       RIGHT ATRIUM           Index LA diam:        3.40 cm 1.66 cm/m  RA Area:     13.80 cm LA Vol (A2C):   33.8 ml 16.52 ml/m RA Volume:   29.90 ml  14.61 ml/m LA Vol (A4C):   42.9 ml 20.96 ml/m LA Biplane Vol: 41.7 ml 20.38 ml/m  AORTIC VALVE LVOT Vmax:   92.00 cm/s LVOT Vmean:  66.600 cm/s LVOT VTI:    0.168 m  AORTA Ao Root diam: 3.50 cm Ao Asc diam:  3.30 cm MITRAL VALVE               TRICUSPID VALVE MV Area (PHT): 2.95 cm    TR Peak grad:   13.2 mmHg MV Decel Time: 257 msec    TR Vmax:        182.00 cm/s MV E velocity: 54.80 cm/s MV A velocity: 57.90 cm/s  SHUNTS MV E/A ratio:  0.95        Systemic VTI:  0.17 m                            Systemic Diam: 2.20 cm Dani Gobble Croitoru MD Electronically signed by Sanda Klein MD Signature Date/Time: 09/27/2020/12:50:34 PM    Final    CT HEAD CODE STROKE WO CONTRAST  Result Date: 09/26/2020 CLINICAL DATA:  Code stroke. Left-sided facial deficits. Left arm and leg weakness. Personal history of brain tumor and seizures. EXAM: CT HEAD WITHOUT CONTRAST  TECHNIQUE: Contiguous axial images were obtained from the base of the skull through the vertex without intravenous contrast. COMPARISON:  MR head without and with contrast 06/23/2020 and 04/21/2018. FINDINGS: Brain: Left frontal craniotomy again noted. Surgical cavity evident. Diffuse white matter  disease is again seen. Remote lacunar infarct is present in the right basal ganglia. No significant changes present. Insular ribbon is normal bilaterally. Cortex is unremarkable. Brainstem and cerebellum are with in normal limits. Vascular: No hyperdense vessel or unexpected calcification. Skull: Left frontal craniotomy noted. Calvarium is otherwise intact. No focal lytic or blastic lesions are present. No significant extracranial soft tissue lesion is present. Sinuses/Orbits: The paranasal sinuses and mastoid air cells are clear. The globes and orbits are within normal limits. ASPECTS [facility] Stroke Program Early CT Score) - Ganglionic level infarction (caudate, lentiform nuclei, internal capsule, insula, M1-M3 cortex): 7/7 - Supraganglionic infarction (M4-M6 cortex): 3/3 Total score (0-10 with 10 being normal): 10/10 IMPRESSION: 1. No acute intracranial abnormality or significant interval change. 2. Stable atrophy and white matter disease. 3. Left frontal craniotomy with resection cavity. 4. Aspects 10/10 5. The above was relayed via text pager to Dr. Theda Sers on 09/26/2020 at 14:32 . Electronically Signed   By: San Morelle M.D.   On: 09/26/2020 14:33     Discharge Exam: Vitals:   09/30/20 0504 09/30/20 0811  BP: 124/85 119/80  Pulse: 60 81  Resp: 18 17  Temp: 97.6 F (36.4 C) 98 F (36.7 C)  SpO2: 96% 94%   Vitals:   09/29/20 1500 09/29/20 2120 09/30/20 0504 09/30/20 0811  BP: 122/89 (!) 129/95 124/85 119/80  Pulse: 99 76 60 81  Resp:  20 18 17   Temp:  97.8 F (36.6 C) 97.6 F (36.4 C) 98 F (36.7 C)  TempSrc:  Oral Oral   SpO2:  95% 96% 94%  Weight:      Height:        General: Pt is alert, awake, not in acute distress Cardiovascular: RRR, S1/S2 +, no rubs, no gallops Respiratory: CTA bilaterally, no wheezing, no rhonchi Abdominal: Soft, NT, ND, bowel sounds + Extremities: no edema, no cyanosis    The results of significant diagnostics from this hospitalization  (including imaging, microbiology, ancillary and laboratory) are listed below for reference.     Microbiology: Recent Results (from the past 240 hour(s))  Respiratory Panel by RT PCR (Flu A&B, Covid) - Nasopharyngeal Swab     Status: None   Collection Time: 09/26/20  3:33 PM   Specimen: Nasopharyngeal Swab  Result Value Ref Range Status   SARS Coronavirus 2 by RT PCR NEGATIVE NEGATIVE Final    Comment: (NOTE) SARS-CoV-2 target nucleic acids are NOT DETECTED.  The SARS-CoV-2 RNA is generally detectable in upper respiratoy specimens during the acute phase of infection. The lowest concentration of SARS-CoV-2 viral copies this assay can detect is 131 copies/mL. A negative result does not preclude SARS-Cov-2 infection and should not be used as the sole basis for treatment or other patient management decisions. A negative result may occur with  improper specimen collection/handling, submission of specimen other than nasopharyngeal swab, presence of viral mutation(s) within the areas targeted by this assay, and inadequate number of viral copies (<131 copies/mL). A negative result must be combined with clinical observations, patient history, and epidemiological information. The expected result is Negative.  Fact Sheet for Patients:  PinkCheek.be  Fact Sheet for Healthcare Providers:  GravelBags.it  This test is no t yet approved or cleared by the [place]  States FDA and  has been authorized for detection and/or diagnosis of SARS-CoV-2 by FDA under an Emergency Use Authorization (EUA). This EUA will remain  in effect (meaning this test can be used) for the duration of the COVID-19 declaration under Section 564(b)(1) of the Act, 21 U.S.C. section 360bbb-3(b)(1), unless the authorization is terminated or revoked sooner.     Influenza A by PCR NEGATIVE NEGATIVE Final   Influenza B by PCR NEGATIVE NEGATIVE Final    Comment:  (NOTE) The Xpert Xpress SARS-CoV-2/FLU/RSV assay is intended as an aid in  the diagnosis of influenza from Nasopharyngeal swab specimens and  should not be used as a sole basis for treatment. Nasal washings and  aspirates are unacceptable for Xpert Xpress SARS-CoV-2/FLU/RSV  testing.  Fact Sheet for Patients: PinkCheek.be  Fact Sheet for Healthcare Providers: GravelBags.it  This test is not yet approved or cleared by the [place] FDA and  has been authorized for detection and/or diagnosis of SARS-CoV-2 by  FDA under an Emergency Use Authorization (EUA). This EUA will remain  in effect (meaning this test can be used) for the duration of the  Covid-19 declaration under Section 564(b)(1) of the Act, 21  U.S.C. section 360bbb-3(b)(1), unless the authorization is  terminated or revoked. Performed at [facility], [place] [address] [place], [place] [address]      Labs: BNP (last 3 results) No results for input(s): BNP in the last 8760 hours. Basic Metabolic Panel: Recent Labs  Lab 09/26/20 1419 09/26/20 1432 09/26/20 1748 09/27/20 0501  NA 136 137  --  136  K 5.5* 4.9  --  3.8  CL 101 101  --  103  CO2 27  --   --  25  GLUCOSE 101* 97  --  97  BUN 17 23*  --  15  CREATININE 1.10 1.00 1.03 1.00  CALCIUM 9.4  --   --  9.5   Liver Function Tests: Recent Labs  Lab 09/26/20 1419  AST 39  ALT 22  ALKPHOS 44  BILITOT 1.0  PROT 6.4*  ALBUMIN 4.2   No results for input(s): LIPASE, AMYLASE in the last 168 hours. No results for input(s): AMMONIA in the last 168 hours. CBC: Recent Labs  Lab 09/26/20 1419 09/26/20 1432 09/26/20 1748 09/27/20 0501  WBC 6.8  --  7.4 7.5  NEUTROABS 3.6  --   --   --   HGB 15.5 16.0 15.2 16.2  HCT 46.4 47.0 46.2 47.0  MCV 94.3  --  94.3 91.8  PLT 230  --  256 268   Cardiac Enzymes: No results for input(s): CKTOTAL, CKMB, CKMBINDEX, TROPONINI in the last 168  hours. BNP: Invalid input(s): POCBNP CBG: Recent Labs  Lab 09/26/20 1418  GLUCAP 95   D-Dimer No results for input(s): DDIMER in the last 72 hours. Hgb A1c No results for input(s): HGBA1C in the last 72 hours. Lipid Profile No results for input(s): CHOL, HDL, LDLCALC, TRIG, CHOLHDL, LDLDIRECT in the last 72 hours. Thyroid function studies No results for input(s): TSH, T4TOTAL, T3FREE, THYROIDAB in the last 72 hours.  Invalid input(s): FREET3 Anemia work up No results for input(s): VITAMINB12, FOLATE, FERRITIN, TIBC, IRON, RETICCTPCT in the last 72 hours. Urinalysis No results found for: COLORURINE, APPEARANCEUR, LABSPEC, [place], GLUCOSEU, [place], [place], KETONESUR, PROTEINUR, UROBILINOGEN, NITRITE, LEUKOCYTESUR Sepsis Labs Invalid input(s): PROCALCITONIN,  WBC,  LACTICIDVEN Microbiology Recent Results (from the past 240 hour(s))  Respiratory Panel by RT PCR (Flu A&B, Covid) -  Nasopharyngeal Swab     Status: None   Collection Time: 09/26/20  3:33 PM   Specimen: Nasopharyngeal Swab  Result Value Ref Range Status   SARS Coronavirus 2 by RT PCR NEGATIVE NEGATIVE Final    Comment: (NOTE) SARS-CoV-2 target nucleic acids are NOT DETECTED.  The SARS-CoV-2 RNA is generally detectable in upper respiratoy specimens during the acute phase of infection. The lowest concentration of SARS-CoV-2 viral copies this assay can detect is 131 copies/mL. A negative result does not preclude SARS-Cov-2 infection and should not be used as the sole basis for treatment or other patient management decisions. A negative result may occur with  improper specimen collection/handling, submission of specimen other than nasopharyngeal swab, presence of viral mutation(s) within the areas targeted by this assay, and inadequate number of viral copies (<131 copies/mL). A negative result must be combined with clinical observations, patient history, and epidemiological information. The expected result is  Negative.  Fact Sheet for Patients:  PinkCheek.be  Fact Sheet for Healthcare Providers:  GravelBags.it  This test is no t yet approved or cleared by the [place] FDA and  has been authorized for detection and/or diagnosis of SARS-CoV-2 by FDA under an Emergency Use Authorization (EUA). This EUA will remain  in effect (meaning this test can be used) for the duration of the COVID-19 declaration under Section 564(b)(1) of the Act, 21 U.S.C. section 360bbb-3(b)(1), unless the authorization is terminated or revoked sooner.     Influenza A by PCR NEGATIVE NEGATIVE Final   Influenza B by PCR NEGATIVE NEGATIVE Final    Comment: (NOTE) The Xpert Xpress SARS-CoV-2/FLU/RSV assay is intended as an aid in  the diagnosis of influenza from Nasopharyngeal swab specimens and  should not be used as a sole basis for treatment. Nasal washings and  aspirates are unacceptable for Xpert Xpress SARS-CoV-2/FLU/RSV  testing.  Fact Sheet for Patients: PinkCheek.be  Fact Sheet for Healthcare Providers: GravelBags.it  This test is not yet approved or cleared by the [place] FDA and  has been authorized for detection and/or diagnosis of SARS-CoV-2 by  FDA under an Emergency Use Authorization (EUA). This EUA will remain  in effect (meaning this test can be used) for the duration of the  Covid-19 declaration under Section 564(b)(1) of the Act, 21  U.S.C. section 360bbb-3(b)(1), unless the authorization is  terminated or revoked. Performed at [facility], [place] [address], [place], [place] [address]      Time coordinating discharge: Over 30 minutes  SIGNED:   Darliss Cheney, MD  [facility] 09/30/2020, 10:50 AM  If 7PM-7AM, please contact night-coverage [email or web address]

## 2020-09-30 NOTE — Progress Notes (Signed)
Cristina Gong, RN  Rehab Admission Coordinator  Physical Medicine and Rehabilitation  PMR Pre-admission      Signed  Date of Service:  09/28/2020 11:01 AM      Related encounter: ED to Hosp-Admission (Current) from 09/26/2020 in [place] [facility]      Signed       Show:Clear all '[x]' Manual'[x]' Template'[x]' Copied  Added by: '[x]' Julious Payer, Vertis Kelch, RN'[x]' Genella Mech, CCC-SLP  '[]' Hover for details PMR Admission Coordinator Pre-Admission Assessment   Patient: Howard White is an 59 y.o., male MRN: [id number] DOB: 1961/09/01 Height: '5\' 10"'  (177.8 cm) Weight: 86.6 kg                                                                                                                                                  Insurance Information HMO:  yes   PPO:      PCP:      IPA:      80/20:      OTHER:  PRIMARY: [employer]       Policy#: [id number]      Subscriber: Patient CM Name: Tammy      Phone#: [phone]     Fax#:  Pre-Cert#: [id number]      Employer:  Benefits:  Phone #: [phone]     Name: Irene Shipper. Date: 11/15/2018-current    Deduct: $0      Out of Pocket Max:$3,400 ($150 met)in network , Combined in and out of network: $5,100 ($150 met)    Life Max: n/a  CIR:  $295/day for days 1-6, $0 dollars for remaining days    SNF:$0 days 1-20, $178 days 21+ Outpatient: $15 copay    Home Health: Covered at 100% DME: 80%     Co-Pay: 20% Providers: in network   SECONDARY: none   The "Data Collection Information Summary" for patients in Inpatient Rehabilitation Facilities with attached "Privacy Act [place] Records" was provided and verbally reviewed with: Patient   Emergency Contact Information         Contact Information     Name Relation Home Work [place] Mother [phone]   [phone]       Current Medical History  Patient Admitting Diagnosis: CVA History of Present Illness: Howard White is a 59 y.o. 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  Per chart review patient lives with elderly parents.  Reportedly independent prior to admission.  Patient with bed and bath on main level.  Presented 09/26/2020 with left-sided weakness and facial droop.  Cranial CT scan showed no acute intracranial abnormality or significant interval change.  Left frontal craniotomy with resection cavity noted.  CT angiogram of head and neck without large vessel  occlusion or limiting proximal stenosis.  MRI of the brain showed a 1.9 x 1 2.  Centimeter acute infarct extending from the right corona radiata into the right basal ganglia.  Echocardiogram with ejection fraction of 60 to 65% no wall motion abnormalities.  Admission chemistries unremarkable except potassium 5.5 glucose 101.  Currently maintained on aspirin and Plavix for CVA prophylaxis.  Tolerating a regular diet. Complete NIHSS TOTAL: 5 Glasgow Coma Scale Score: 15   Past Medical History      Past Medical History:  Diagnosis Date  . Brain cancer ([place])    . Seizures ([place])    . Thyroid disease      Hypothyroid      Family History  family history includes Brain cancer in his unknown relative; Cancer in his unknown relative; Diabetes in his unknown relative; Stroke in his unknown relative; Thyroid disease in his unknown relative.   Prior Rehab/Hospitalizations:  Has the patient had prior rehab or hospitalizations prior to admission? No   Has the patient had major surgery during 100 days prior to admission? No   Current Medications    Current Facility-Administered Medications:  .   stroke: mapping our early stages of recovery book, , Does not apply, Once, Arrien, Jimmy Picket, MD .  acetaminophen (TYLENOL) tablet 650 mg, 650 mg, Oral, Q6H PRN **OR** acetaminophen (TYLENOL) suppository 650 mg, 650 mg, Rectal, Q6H PRN,  Arrien, Jimmy Picket, MD .  aspirin EC tablet 81 mg, 81 mg, Oral, Daily, Rosalin Hawking, MD, 81 mg at 09/30/20 0834 .  atorvastatin (LIPITOR) tablet 40 mg, 40 mg, Oral, Daily, Rosalin Hawking, MD, 40 mg at 09/30/20 0833 .  clopidogrel (PLAVIX) tablet 75 mg, 75 mg, Oral, Daily, Rosalin Hawking, MD, 75 mg at 09/30/20 0833 .  docusate sodium (COLACE) capsule 100 mg, 100 mg, Oral, BID, Pahwani, Ravi, MD, 100 mg at 09/30/20 0833 .  escitalopram (LEXAPRO) tablet 10 mg, 10 mg, Oral, Daily, Arrien, Jimmy Picket, MD, 10 mg at 09/30/20 0834 .  famotidine (PEPCID) tablet 20 mg, 20 mg, Oral, Daily, Arrien, Jimmy Picket, MD, 20 mg at 09/30/20 [phone] .  fluticasone (FLONASE) 50 MCG/ACT nasal spray 2 spray, 2 spray, Each Nare, Daily PRN, Arrien, Jimmy Picket, MD, 2 spray at 09/30/20 [phone] .  lamoTRIgine (LAMICTAL) tablet 225 mg, 225 mg, Oral, Daily, Arrien, Jimmy Picket, MD, 225 mg at 09/30/20 [phone] .  lamoTRIgine (LAMICTAL) tablet 300 mg, 300 mg, Oral, QHS, Arrien, Jimmy Picket, MD, 300 mg at 09/29/20 2034 .  levothyroxine (SYNTHROID) tablet 112 mcg, 112 mcg, Oral, QHS, Arrien, Jimmy Picket, MD, 112 mcg at 09/29/20 2034 .  multivitamin with minerals tablet 1 tablet, 1 tablet, Oral, Daily, Arrien, Jimmy Picket, MD, 1 tablet at 09/30/20 [phone] .  ondansetron (ZOFRAN) tablet 4 mg, 4 mg, Oral, Q6H PRN **OR** ondansetron (ZOFRAN) injection 4 mg, 4 mg, Intravenous, Q6H PRN, Arrien, Jimmy Picket, MD .  white petrolatum (VASELINE) gel, , Topical, PRN, Arrien, Jimmy Picket, MD, 0.2 application at 80/03/49 2250   Patients Current Diet:     Diet Order                      Diet Heart Room service appropriate? Yes; Fluid consistency: Thin  Diet effective now                      Precautions / Restrictions Precautions Precautions: Fall Precaution Comments: [name] Restrictions Weight Bearing Restrictions: No    Has the  patient had 2 or more falls or a fall with injury in the past  year?No   Prior Activity Level Community (5-7x/wk): Pt. was active in the community PTA   Prior Functional Level Prior Function Level of Independence: Independent Comments: retired due to Animal nutritionist, indep with ADLs   Self Care: Did the patient need help bathing, dressing, using the toilet or eating?  Independent   Indoor Mobility: Did the patient need assistance with walking from room to room (with or without device)? Independent   Stairs: Did the patient need assistance with internal or external stairs (with or without device)? Independent   Functional Cognition: Did the patient need help planning regular tasks such as shopping or remembering to take medications? Needed some help   Home Assistive Devices / [place] Devices/Equipment: None Home Equipment: Hand held shower head   Prior Device Use: Indicate devices/aids used by the patient prior to current illness, exacerbation or injury? None of the above   Current Functional Level Cognition   Arousal/Alertness: Awake/alert Overall Cognitive Status: Impaired/Different from baseline Current Attention Level: Selective Orientation Level: Oriented X4 Following Commands: Follows one step commands consistently, Follows multi-step commands inconsistently Safety/Judgement: Decreased awareness of safety, Decreased awareness of deficits General Comments: Pt quite impulsive, when asked if it would be SAFE for him to transfer himself back to bed he said, " I could do it" and then when reiterated would it be safe he said, "probably not" so I made sure he was on a chair alarm when I left him up in the recliner and told his RN he was up.   Attention: Focused, Sustained Focused Attention: Appears intact Sustained Attention: Appears intact Memory: Impaired Memory Impairment: Decreased short term memory Decreased Short Term Memory: Verbal basic Awareness: Appears intact Problem Solving: Impaired Problem Solving  Impairment: Verbal basic, Verbal complex, Functional basic, Functional complex Executive Function: Reasoning, Organizing Reasoning: Impaired Reasoning Impairment: Verbal complex Organizing: Impaired Organizing Impairment: Verbal complex Safety/Judgment: Appears intact    Extremity Assessment (includes Sensation/Coordination)   Upper Extremity Assessment: LUE deficits/detail LUE Deficits / Details: elbow 3-/5, 2/5 at shoulder, 2-/5 grip/wrist LUE Coordination: decreased fine motor, decreased gross motor  Lower Extremity Assessment: Defer to PT evaluation LLE Deficits / Details: increased tone/adductor, able to complete LAQ in sitting but not able to bring knee to chest in bed     ADLs   Overall ADL's : Needs assistance/impaired Eating/Feeding: Minimal assistance, Sitting Grooming: Min guard, Sitting, Wash/dry face Grooming Details (indicate cue type and reason): seated EOB Upper Body Bathing: Minimal assistance, Sitting Lower Body Bathing: Maximal assistance, Sit to/from stand Upper Body Dressing : Moderate assistance, Sitting Lower Body Dressing: Maximal assistance, Sitting/lateral leans, Bed level Lower Body Dressing Details (indicate cue type and reason): donning socks in bed today, able to don R sock, cueing for technique for L sock with max assist  Toileting- Clothing Manipulation and Hygiene: Total assistance, Sit to/from stand Functional mobility during ADLs: Moderate assistance (sit<>stand ) General ADL Comments: focused on L UE exercises today      Mobility   Overal bed mobility: Needs Assistance Bed Mobility: Supine to Sit Supine to sit: Mod assist, HOB elevated Sit to supine: Mod assist General bed mobility comments: Mod assist to prevent posterior LOB during transition to sitting.  Pt moving very quickly grasping at bed, rails, therapist to come up to sitting with his right hand.       Transfers   Overall transfer level: Needs assistance Equipment used:  None Transfers: Sit to/from Stand, Google Transfers Sit to Stand: Mod assist Stand pivot transfers: Max assist Squat pivot transfers: Mod assist General transfer comment: Mod assist to power up to standing and prevent inital L lateral thrust, pt quick to move with squat pivot to drop arm recliner chiar on his right side.  Quick to move and uncoordinated resulting in him flinging himself over cues for safety and to go slow.      Ambulation / Gait / Stairs / Wheelchair Mobility   Ambulation/Gait General Gait Details: able to transfer to chair, deferred amb due to needing second person for safety     Posture / Balance Dynamic Sitting Balance Sitting balance - Comments: when pt lets go of the bed rail he falls posteriorly and to the L requiring physcal assist to prevent LOB.  Balance Overall balance assessment: Needs assistance Sitting-balance support: Feet supported, Single extremity supported Sitting balance-Leahy Scale: Poor Sitting balance - Comments: when pt lets go of the bed rail he falls posteriorly and to the L requiring physcal assist to prevent LOB.  Postural control: Posterior lean, Left lateral lean Standing balance support: Single extremity supported Standing balance-Leahy Scale: Poor Standing balance comment: dependent on physical therapist and R hand supported for balance in standing.      Special needs/care consideration None        Previous Home Environment  Living Arrangements: Parent (parents)  Lives With: Family Available Help at Discharge: Family, Available 24 hours/day Type of Home: House Home Layout: Two level, Able to live on main level with bedroom/bathroom Alternate Level Stairs-Rails: Right Alternate Level Stairs-Number of Steps: flight Home Access: Level entry Bathroom Shower/Tub: Multimedia programmer: Standard Bathroom Accessibility: Yes How Accessible: Accessible via walker [place]: No   Discharge Living Setting Plans for  Discharge Living Setting: Patient's home, House Type of Home at Discharge: House Discharge Home Layout: Two level, Able to live on main level with bedroom/bathroom Alternate Level Stairs-Rails: Right (15) Alternate Level Stairs-Number of Steps: 15 Discharge Home Access: Level entry Discharge Bathroom Shower/Tub: Tub/shower unit Discharge Bathroom Toilet: Standard Discharge Bathroom Accessibility: Yes How Accessible: Accessible via walker Does the patient have any problems obtaining your medications?: No   Social/Family/Support Systems Patient Roles: Parent Contact Information: [phone] Anticipated Caregiver: Domonic Kimball Anticipated Caregiver's Contact Information: [phone] Ability/Limitations of Caregiver: Can provide Min A Caregiver Availability: 24/7 Discharge Plan Discussed with Primary Caregiver: Yes Is Caregiver In Agreement with Plan?: Yes Does Caregiver/Family have Issues with Lodging/Transportation while Pt is in Rehab?: No   Goals Patient/Family Goal for Rehab: PT/OT/SLP Min A Expected length of stay: 12-14 days Pt/Family Agrees to Admission and willing to participate: Yes Program Orientation Provided & Reviewed with Pt/Caregiver Including Roles  & Responsibilities: Yes   Decrease burden of Care through IP rehab admission: n/a   Possible need for SNF placement upon discharge: Not anticipated   Patient Condition: This patient's condition remains as documented in the consult dated 09/29/2020, in which the Rehabilitation Physician determined and documented that the patient's condition is appropriate for intensive rehabilitative care in an inpatient rehabilitation facility. Will admit to inpatient rehab today.   Preadmission Screen Completed By: Clemens Catholic with updates by  Cleatrice Burke, RN, 09/30/2020 10:08 AM ______________________________________________________________________   Discussed status with Dr. Ranell Patrick on 09/30/2020 at  1007 and received  approval for admission today.   Admission Coordinator: Clemens Catholic with updates by  Cleatrice Burke, time 1007 Date 09/30/2020  Cosigned by: Izora Ribas, MD at 09/30/2020 10:10 AM  Revision History                                         Note Details  Author Cristina Gong, RN File Time 09/30/2020 10:08 AM  Author Type Rehab Admission Coordinator Status Signed  Last Editor Cristina Gong, RN Service Physical Medicine and Rehabilitation

## 2020-09-30 NOTE — Discharge Instructions (Signed)
 Hospital Discharge After a Stroke  Being discharged from the hospital after a stroke can feel overwhelming. Many things may be different, and it is normal to feel scared or anxious. Some stroke survivors may be able to return to their homes, and others may need more specialized care on a temporary or permanent basis. Your stroke care team will work with you to develop a discharge plan that is best for you. Ask questions if you do not understand something. Invite a friend or family member to participate in discharge planning. Understanding and following your discharge plan can help to prevent another stroke or other problems. Understanding your medicines After a stroke, your health care provider may prescribe one or more types of medicine. It is important to take medicines exactly as told by your health care provider. Serious harm, such as another stroke, can happen if you are unable to take your medicine exactly as prescribed. Make sure you understand:  What medicine to take.  Why you are taking the medicine.  How and when to take it.  If it can be taken with your other medicines and herbal supplements.  Possible side effects.  When to call your health care provider if you have any side effects.  How you will get and pay for your medicines. Medical assistance programs may be able to help you pay for prescription medicines if you cannot afford them. If you are taking an anticoagulant, be sure to take it exactly as told by your health care provider. This type of medicine can increase the risk of bleeding because it works to prevent blood from clotting. You may need to take certain precautions to prevent bleeding. You should contact your health care provider if you have:  Bleeding or bruising.  A fall or other injury to your head.  Blood in your urine or stool (feces). Planning for home safety  Take steps to prevent falls, such as installing grab bars or using a shower chair. Ask a  friend or family member to get needed things in place before you go home if possible. A therapist can come to your home to make recommendations for safety equipment. Ask your health care provider if you would benefit from this service or from home care. Getting needed equipment Ask your health care provider for a list of any medical equipment and supplies you will need at home. These may include items such as:  Walkers.  Canes.  Wheelchairs.  Hand-strengthening devices.  Special eating utensils. Medical equipment can be rented or purchased, depending on your insurance coverage. Check with your insurance company about what is covered. Keeping follow-up visits After a stroke, you will need to follow up regularly with a health care provider. You may also need rehabilitation, which can include physical therapy, occupational therapy, or speech-language therapy. Keeping these appointments is very important to your recovery after a stroke. Be sure to bring your medicine list and discharge papers with you to your appointments. If you need help to keep track of your schedule, use a calendar or appointment reminder. Preventing another stroke Having a stroke puts you at risk for another stroke in the future. Ask your health care provider what actions you can take to lower the risk. These may include:  Increasing how much you exercise.  Making a healthy eating plan.  Quitting smoking.  Managing other health conditions, such as high blood pressure, high cholesterol, or diabetes.  Limiting alcohol use. Knowing the warning signs of a stroke  Make   sure you understand the signs of a stroke. Before you leave the hospital, you will receive information outlining the stroke warning signs. Share these with your friends and family members. "BE FAST" is an easy way to remember the main warning signs of a stroke:  B - Balance. Signs are dizziness, sudden trouble walking, or loss of balance.  E - Eyes.  Signs are trouble seeing or a sudden change in vision.  F - Face. Signs are sudden weakness or numbness of the face, or the face or eyelid drooping on one side.  A - Arms. Signs are weakness or numbness in an arm. This happens suddenly and usually on one side of the body.  S - Speech. Signs are sudden trouble speaking, slurred speech, or trouble understanding what people say.  T - Time. Time to call emergency services. Write down what time symptoms started. Other signs of stroke may include:  A sudden, severe headache with no known cause.  Nausea or vomiting.  Seizure. These symptoms may represent a serious problem that is an emergency. Do not wait to see if the symptoms will go away. Get medical help right away. Call your local emergency services (911 in the [place] Do not drive yourself to the hospital. Make note of the time that you had your first symptoms. Your emergency responders or emergency room staff will need to know this information. Summary  Being discharged from the hospital after a stroke can feel overwhelming. It is normal to feel scared or anxious.  Make sure you take medicines exactly as told by your health care provider.  Know the warning signs of a stroke, and get help right way if you have any of these symptoms. "BE FAST" is an easy way to remember the main warning signs of a stroke. This information is not intended to replace advice given to you by your health care provider. Make sure you discuss any questions you have with your health care provider. Document Revised: 07/25/2019 Document Reviewed: 02/04/2017 Elsevier Patient Education  2020 [employer]  

## 2020-09-30 NOTE — Progress Notes (Signed)
Patient discharged to in [facility] rehab. Patient alert and oriented and VS stable for discharge.

## 2020-10-01 ENCOUNTER — Inpatient Hospital Stay (HOSPITAL_COMMUNITY): Payer: PPO | Admitting: Occupational Therapy

## 2020-10-01 ENCOUNTER — Inpatient Hospital Stay (HOSPITAL_COMMUNITY): Payer: PPO | Admitting: Speech Pathology

## 2020-10-01 ENCOUNTER — Inpatient Hospital Stay (HOSPITAL_COMMUNITY): Payer: PPO | Admitting: Physical Therapy

## 2020-10-01 ENCOUNTER — Inpatient Hospital Stay (HOSPITAL_COMMUNITY): Payer: PPO

## 2020-10-01 DIAGNOSIS — I6381 Other cerebral infarction due to occlusion or stenosis of small artery: Secondary | ICD-10-CM

## 2020-10-01 LAB — CBC WITH DIFFERENTIAL/PLATELET
Abs Immature Granulocytes: 0.05 10*3/uL (ref 0.00–0.07)
Basophils Absolute: 0.1 10*3/uL (ref 0.0–0.1)
Basophils Relative: 0 %
Eosinophils Absolute: 0.3 10*3/uL (ref 0.0–0.5)
Eosinophils Relative: 2 %
HCT: 51 % (ref 39.0–52.0)
Hemoglobin: 17.6 g/dL — ABNORMAL HIGH (ref 13.0–17.0)
Immature Granulocytes: 0 %
Lymphocytes Relative: 19 %
Lymphs Abs: 2.2 10*3/uL (ref 0.7–4.0)
MCH: 31.1 pg (ref 26.0–34.0)
MCHC: 34.5 g/dL (ref 30.0–36.0)
MCV: 90.1 fL (ref 80.0–100.0)
Monocytes Absolute: 1.6 10*3/uL — ABNORMAL HIGH (ref 0.1–1.0)
Monocytes Relative: 14 %
Neutro Abs: 7.5 10*3/uL (ref 1.7–7.7)
Neutrophils Relative %: 65 %
Platelets: 305 10*3/uL (ref 150–400)
RBC: 5.66 MIL/uL (ref 4.22–5.81)
RDW: 11.9 % (ref 11.5–15.5)
WBC: 11.6 10*3/uL — ABNORMAL HIGH (ref 4.0–10.5)
nRBC: 0 % (ref 0.0–0.2)

## 2020-10-01 LAB — COMPREHENSIVE METABOLIC PANEL
ALT: 37 U/L (ref 0–44)
AST: 44 U/L — ABNORMAL HIGH (ref 15–41)
Albumin: 4.2 g/dL (ref 3.5–5.0)
Alkaline Phosphatase: 58 U/L (ref 38–126)
Anion gap: 10 (ref 5–15)
BUN: 14 mg/dL (ref 6–20)
CO2: 22 mmol/L (ref 22–32)
Calcium: 9.4 mg/dL (ref 8.9–10.3)
Chloride: 103 mmol/L (ref 98–111)
Creatinine, Ser: 1.05 mg/dL (ref 0.61–1.24)
GFR, Estimated: >60 mL/min (ref >60)
Glucose, Bld: 98 mg/dL (ref 70–99)
Potassium: 4 mmol/L (ref 3.5–5.1)
Sodium: 135 mmol/L (ref 135–145)
Total Bilirubin: 1.3 mg/dL — ABNORMAL HIGH (ref 0.3–1.2)
Total Protein: 7 g/dL (ref 6.5–8.1)

## 2020-10-01 MED ORDER — FLUTICASONE PROPIONATE 50 MCG/ACT NA SUSP
2.0000 | Freq: Every day | NASAL | Status: DC
  Administered 2020-10-02 – 2020-10-29 (×26): 2 via NASAL
  Filled 2020-10-01 (×2): qty 16

## 2020-10-01 MED ORDER — BISACODYL 10 MG RE SUPP
10.0000 mg | Freq: Every day | RECTAL | Status: DC | PRN
  Administered 2020-10-01 – 2020-10-07 (×2): 10 mg via RECTAL
  Filled 2020-10-01 (×2): qty 1

## 2020-10-01 MED ORDER — FLUTICASONE PROPIONATE 50 MCG/ACT NA SUSP: 1.0000 | Freq: Every day | NASAL | Status: DC

## 2020-10-01 MED ORDER — CALCIUM CARBONATE ANTACID 500 MG PO CHEW
1.0000 | CHEWABLE_TABLET | Freq: Every day | ORAL | Status: DC
  Administered 2020-10-01 – 2020-10-29 (×29): 200 mg via ORAL
  Filled 2020-10-01 (×30): qty 1

## 2020-10-01 MED ORDER — KETOTIFEN FUMARATE 0.025 % OP SOLN: 1.0000 [drp] | Freq: Two times a day (BID) | OPHTHALMIC | Status: DC | PRN

## 2020-10-01 MED ORDER — MELATONIN 3 MG PO TABS
3.0000 mg | ORAL_TABLET | Freq: Every day | ORAL | Status: DC
  Administered 2020-10-01: 3 mg via ORAL
  Filled 2020-10-01: qty 1

## 2020-10-01 NOTE — Progress Notes (Signed)
Inpatient [facility] Individual Statement of Services  Patient Name:  Howard White  Date:  10/01/2020  Welcome to the [facility].  Our goal is to provide you with an individualized program based on your diagnosis and situation, designed to meet your specific needs.  With this comprehensive rehabilitation program, you will be expected to participate in at least 3 hours of rehabilitation therapies Monday-Friday, with modified therapy programming on the weekends.  Your rehabilitation program will include the following services:  Physical Therapy (PT), Occupational Therapy (OT), Speech Therapy (ST), 24 hour per day rehabilitation nursing, Therapeutic Recreaction (TR), Neuropsychology, Care Coordinator, Rehabilitation Medicine, Nutrition Services, Pharmacy Services and Other  Weekly team conferences will be held on Wednesday to discuss your progress.  Your Inpatient Rehabilitation Care Coordinator will talk with you frequently to get your input and to update you on team discussions.  Team conferences with you and your family in attendance may also be held.  Expected length of stay: 12-14 Days  Overall anticipated outcome: Min A  Depending on your progress and recovery, your program may change. Your Inpatient Rehabilitation Care Coordinator will coordinate services and will keep you informed of any changes. Your Inpatient Rehabilitation Care Coordinator's name and contact numbers are listed  below.  The following services may also be recommended but are not provided by the [facility]:    [place] will be made to provide these services after discharge if needed.  Arrangements include referral to agencies that provide these services.  Your insurance has been verified to be:  [employer] Your primary doctor is:  Seward Carol, MD  Pertinent information will  be shared with your doctor and your insurance company.  Inpatient Rehabilitation Care Coordinator:  Erlene Quan, [place] or [phone]  Information discussed with and copy given to patient by: [name], 10/01/2020, 8:49 AM

## 2020-10-01 NOTE — Progress Notes (Signed)
[facility] PHYSICAL MEDICINE & REHABILITATION PROGRESS NOTE   Subjective/Complaints:  Problem with sleeping, no pain c/os No BP since 11/14  ROS- neg CP, SOB, N/V/D. No dysuria  Objective:   No results found. Recent Labs    10/01/20 0359  WBC 11.6*  HGB 17.6*  HCT 51.0  PLT 305   Recent Labs    10/01/20 0359  NA 135  K 4.0  CL 103  CO2 22  GLUCOSE 98  BUN 14  CREATININE 1.05  CALCIUM 9.4   No intake or output data in the 24 hours ending 10/01/20 0711      Physical Exam: Vital Signs Blood pressure 130/89, pulse 72, temperature 98.1 F (36.7 C), resp. rate 16, height 5\' 10"  (1.778 m), weight 84.4 kg, SpO2 94 %.   General: No acute distress Mood and affect are appropriate Heart: Regular rate and rhythm no rubs murmurs or extra sounds Lungs: Clear to auscultation, breathing unlabored, no rales or wheezes Abdomen: Positive bowel sounds, soft nontender to palpation, nondistended Extremities: No clubbing, cyanosis, or edema Skin: No evidence of breakdown, no evidence of rash Neurologic: Cranial nerves II through XII intact, motor strength is 5/5 in RIght  deltoid, bicep, tricep, grip, hip flexor, knee extensors, ankle dorsiflexor and plantar flexor Brunnstrom 3 LUE Tone left pec and biceps Sensation to LT reported as equal BUE and BLE Musculoskeletal: Full range of motion in all 4 extremities. No joint swelling  Assessment/Plan: 1. Functional deficits which require 3+ hours per day of interdisciplinary therapy in a comprehensive inpatient rehab setting.  Physiatrist is providing close team supervision and 24 hour management of active medical problems listed below.  Physiatrist and rehab team continue to assess barriers to discharge/monitor patient progress toward functional and medical goals  Care Tool:  Bathing              Bathing assist       Upper Body Dressing/Undressing Upper body dressing        Upper body assist      Lower Body  Dressing/Undressing Lower body dressing            Lower body assist       Toileting Toileting    Toileting assist       Transfers Chair/bed transfer  Transfers assist           Locomotion Ambulation   Ambulation assist              Walk 10 feet activity   Assist           Walk 50 feet activity   Assist           Walk 150 feet activity   Assist           Walk 10 feet on uneven surface  activity   Assist           Wheelchair     Assist               Wheelchair 50 feet with 2 turns activity    Assist            Wheelchair 150 feet activity     Assist          Blood pressure 130/89, pulse 72, temperature 98.1 F (36.7 C), resp. rate 16, height 5\' 10"  (1.778 m), weight 84.4 kg, SpO2 94 %.    Medical Problem List and Plan: 1.  Left-sided weakness and facial droop secondary to acute infarct  right basal ganglia/corona radiata as well as history of astrocytoma with left frontal craniotomy resection 20 years ago             -patient may shower             -ELOS/Goals: modI 12-16 days CIR PT, OT, SLP 2.  Antithrombotics: -DVT/anticoagulation: SCDs             -antiplatelet therapy: Aspirin 81 mg daily and Plavix 75 mg daily x3 weeks then aspirin alone 3. Pain Management: Tylenol as needed. Denies pain 4. Mood: Lexapro 10 mg daily.  Provide emotional support             -antipsychotic agents: N/A 5. Neuropsych: This patient is capable of making decisions on his own behalf. 6. Skin/Wound Care: Routine skin checks 7. Fluids/Electrolytes/Nutrition: Routine in and outs with weekly chemistries. Electrolytes stable on 11/13 8.  Seizure disorder.  Continue Lamictal 225 mg daily and 300 mg nightly 9.  Hyperlipidemia.  LDL 131 on 11/13. Continue Lipitor 10.  Hypothyroidism.  Synthroid 11.  Leukocytosis 11.6 afebrile  LOS: 1 days A FACE TO FACE EVALUATION WAS PERFORMED  Charlett Blake 10/01/2020,  7:11 AM

## 2020-10-01 NOTE — Progress Notes (Signed)
Patient appears irritated with staff despite staff efforts to frequently address needs. Patient gets frustrated with urinal use but is somewhat resistant to help or instruction. Patient instructed to call for help with urinal use.

## 2020-10-01 NOTE — Evaluation (Signed)
Speech Language Pathology Assessment and Plan  Patient Details  Name: Howard White MRN: [id number] Date of Birth: June 15, 1961  SLP Diagnosis: Dysarthria;Cognitive Impairments  Rehab Potential: Good ELOS: 2.5-3 weeks    Today's Date: 10/01/2020 SLP Individual Time: 1100-1145 SLP Individual Time Calculation (min): 65 min   Hospital Problem: Principal Problem:   Cerebrovascular accident (CVA) of right basal ganglia ([place])  Past Medical History:  Past Medical History:  Diagnosis Date  . Brain cancer ([place])   . Seizures ([place])   . Thyroid disease    Hypothyroid   Past Surgical History:  Past Surgical History:  Procedure Laterality Date  . brain cancer    . BRAIN SURGERY      Assessment / Plan / Recommendation Clinical Impression   HPI: Howard White is a 59 year old right-handed male history of brain tumor astrocytoma with left frontal craniotomy resection 20 years ago, seizure disorder maintained on Lamictal has been seizure-free x5 years as well as history of hypothyroidism.  Per chart review patient lives with elderly parents.  Reportedly independent prior to admission.  Presented 09/26/2020 left-sided weakness facial droop.  Cranial CT scan showed no acute intracranial abnormality or significant interval change.  Left frontal craniotomy with resection cavity noted.  CT angiogram of head and neck without large vessel occlusion or limiting proximal stenosis.  MRI of the brain showed a 1.9 x 1.2 cm acute infarct extending from the right corona radiata into the right basal ganglia.  Echocardiogram with ejection fraction of 60 to 65% no wall motion abnormalities.  Admission chemistries unremarkable except potassium 5.5 glucose 102.  Maintain on aspirin and Plavix for CVA prophylaxis.  X3 weeks and aspirin alone.  Tolerating regular diet.  Therapy evaluations completed and patient was admitted for a comprehensive rehab program 09/30/20 and SLP evaluation was administered 10/01/20 with results  as follows:  Pt noted to have chronic baseline deficits in short term memory following brain cancer and surgeries. However, acute cognitive communication deficits are evident and most remarkable for decreased problem solving (both basic calculations and functional situations), sequencing, attention, as well as intellectual and emergent awareness. He reports he had assistance with medication management but managed his own finances/bill paying at baseline. Portions of the Cognistat were administered, however pt started to report extreme anxiety and unable to finish (his rate of breathing increased and became extremely restless). Max A multimodal cues required for recall and sequencing, Min-Mod for attention to task when transferring from chair to bed to decrease his self-reported anxiety. Pt's speech intelligibility was slightly reduced to approximately 85-90% in conversation due to left sided weakness in the face and oral structures, leading to articulatory imprecision. Pt's vocal intensity also reduced, further impacting intelligibility. Expressive and receptive language skills were [facility].   Recommend pt receive skilled ST while inpatient in order to address dysarthria and cognitive impairments in order to maximize his functional communication, safety, and independence prior to discharge.    Skilled Therapeutic Interventions          Cognitive-linguistic evaluation was administered and results were reviewed with pt (please see above for details regarding results).    SLP Assessment  Patient will need skilled [facility] Pathology Services during CIR admission    Recommendations  Recommendations for Other Services: Neuropsych consult Patient destination: Home Follow up Recommendations: Home Health SLP;24 hour supervision/assistance Equipment Recommended: None recommended by SLP    SLP Frequency 3 to 5 out of 7 days   SLP Duration  SLP Intensity  SLP Treatment/Interventions 2.5-3  weeks  Minumum of 1-2 x/day, 30 to 90 minutes  Cognitive remediation/compensation;Cueing hierarchy;Speech/Language facilitation;Internal/external aids;Patient/family education;Functional tasks    Pain Pain Assessment Pain Scale: 0-10 Pain Score: 0-No pain  Prior Functioning Baseline deficit details: short term memory deficits at baseline  SLP Evaluation Cognition Overall Cognitive Status: Within Functional Limits for tasks assessed (although short term memory impaired at baseline) Arousal/Alertness: Awake/alert Orientation Level: Oriented X4 Attention: Sustained;Selective Selective Attention: Impaired Selective Attention Impairment: Verbal basic;Functional basic Memory: Impaired Memory Impairment: Decreased short term memory Decreased Short Term Memory: Verbal basic Awareness: Impaired Awareness Impairment: Emergent impairment;Intellectual impairment Problem Solving: Impaired Problem Solving Impairment: Verbal basic;Functional basic Executive Function: Reasoning;Organizing Reasoning: Impaired Reasoning Impairment: Verbal complex Organizing: Impaired Organizing Impairment: Functional basic Behaviors: Restless Safety/Judgment: Impaired  Comprehension Auditory Comprehension Overall Auditory Comprehension: Appears within functional limits for tasks assessed Conversation: Complex Interfering Components: Working Curator: Not tested Reading Comprehension Reading Status: Not tested Expression Expression Primary Mode of Expression: Verbal Verbal Expression Overall Verbal Expression: Appears within functional limits for tasks assessed Interfering Components: Speech intelligibility Non-Verbal Means of Communication: Not applicable Written Expression Written Expression: Not tested Oral Motor Oral Motor/Sensory Function Overall Oral Motor/Sensory Function: Mild impairment Facial ROM: Reduced left Facial Symmetry: Abnormal  symmetry left Facial Strength: Within Functional Limits Facial Sensation: Reduced left Lingual ROM: Within Functional Limits Lingual Symmetry: Within Functional Limits Lingual Strength: Within Functional Limits Velum: Within Functional Limits Mandible: Within Functional Limits Motor Speech Overall Motor Speech: Impaired Phonation: Normal Resonance: Within functional limits Articulation: Impaired Level of Impairment: Sentence Intelligibility: Intelligibility reduced Word: 75-100% accurate Phrase: 75-100% accurate Sentence: 75-100% accurate Conversation: 75-100% accurate Motor Planning: Witnin functional limits Motor Speech Errors: Not applicable Effective Techniques: Increased vocal intensity;Over-articulate;Slow rate  Care Tool Care Tool Cognition Expression of Ideas and Wants Expression of Ideas and Wants: Some difficulty - exhibits some difficulty with expressing needs and ideas (e.g, some words or finishing thoughts) or speech is not clear   Understanding Verbal and Non-Verbal Content Understanding Verbal and Non-Verbal Content: Understands (complex and basic) - clear comprehension without cues or repetitions   Memory/Recall Ability *first 3 days only Memory/Recall Ability *first 3 days only: Current season;That he or she is in a hospital/hospital unit     Intelligibility: Intelligibility reduced Word: 75-100% accurate Phrase: 75-100% accurate Sentence: 75-100% accurate Conversation: 75-100% accurate  Short Term Goals: Week 1: SLP Short Term Goal 1 (Week 1): Pt will use compensatory strategies for speech intelligibility to achieve 90% intelligibilty at the sentence level with Min A verbal and visual cues. SLP Short Term Goal 2 (Week 1): Pt will demonstrate ability to problem solve basic to mildly complex tasks with Mod A verbal/visual cues. SLP Short Term Goal 3 (Week 1): Pt will identify 2 cognitive impairments with Mod A verbal/visual cues. SLP Short Term Goal 4 (Week  1): Pt will selectively attend to tasks with Min A verbal/visual cues.  Refer to Care Plan for Long Term Goals  Recommendations for other services: Neuropsych  Discharge Criteria: Patient will be discharged from SLP if patient refuses treatment 3 consecutive times without medical reason, if treatment goals not met, if there is a change in medical status, if patient makes no progress towards goals or if patient is discharged from hospital.  The above assessment, treatment plan, treatment alternatives and goals were discussed and mutually agreed upon: by patient  Arbutus Leas 10/01/2020, 12:04 PM

## 2020-10-01 NOTE — Evaluation (Signed)
Occupational Therapy Assessment and Plan  Patient Details  Name: Howard White MRN: [id number] Date of Birth: [birth year]  OT Diagnosis: abnormal posture, altered mental status, ataxia, cognitive deficits, hemiplegia affecting non-dominant side and muscle weakness (generalized) Rehab Potential: Rehab Potential (ACUTE ONLY): Excellent ELOS: 19-21 days   Today's Date: 10/01/2020 OT Individual Time: 5852-7782 OT Individual Time Calculation (min): 78 min     Hospital Problem: Principal Problem:   Cerebrovascular accident (CVA) of right basal ganglia ([place])   Past Medical History:  Past Medical History:  Diagnosis Date  . Brain cancer ([place])   . Seizures ([place])   . Thyroid disease    Hypothyroid   Past Surgical History:  Past Surgical History:  Procedure Laterality Date  . brain cancer    . BRAIN SURGERY      Assessment & Plan Clinical Impression: Patient is a 59 y.o. year old male with recent admission to the hospital on 09/26/2020 left-sided weakness facial droop.  Cranial CT scan showed no acute intracranial abnormality or significant interval change.  Left frontal craniotomy with resection cavity noted.  CT angiogram of head and neck without large vessel occlusion or limiting proximal stenosis.  MRI of the brain showed a 1.9 x 1.2 cm acute infarct extending from the right corona radiata into the right basal ganglia.  Patient transferred to CIR on 09/30/2020 .    Patient currently requires max with basic self-care skills secondary to muscle weakness and muscle paralysis, impaired timing and sequencing, abnormal tone, unbalanced muscle activation, motor apraxia, ataxia, decreased coordination and decreased motor planning, decreased motor planning, decreased awareness, decreased problem solving, decreased safety awareness and decreased memory and decreased sitting balance, decreased standing balance, decreased postural control, hemiplegia and decreased balance strategies.  Prior to  hospitalization, patient could complete ADLs and IADLs  with independent .  Patient will benefit from skilled intervention to decrease level of assist with basic self-care skills and increase independence with basic self-care skills prior to discharge home with care partner.  Anticipate patient will require minimal physical assistance and follow up outpatient.  OT - End of Session Endurance Deficit: Yes OT Assessment Rehab Potential (ACUTE ONLY): Excellent OT Patient demonstrates impairments in the following area(s): Balance;Cognition;Endurance;Motor;Safety;Perception;Sensory OT Basic ADL's Functional Problem(s): Eating;Grooming;Bathing;Dressing;Toileting OT Transfers Functional Problem(s): Toilet;Tub/Shower OT Additional Impairment(s): Fuctional Use of Upper Extremity OT Plan OT Intensity: Minimum of 1-2 x/day, 45 to 90 minutes OT Frequency: 5 out of 7 days OT Duration/Estimated Length of Stay: 19-21 days OT Treatment/Interventions: Balance/vestibular training;Cognitive remediation/compensation;Community reintegration;DME/adaptive equipment instruction;Disease mangement/prevention;Discharge planning;Functional electrical stimulation;Pain management;UE/LE Coordination activities;Therapeutic Activities;Self Care/advanced ADL retraining;Functional mobility training;Patient/family education;Therapeutic Exercise;Visual/perceptual remediation/compensation;Neuromuscular re-education;Psychosocial support;Splinting/orthotics;UE/LE Strength taining/ROM;Wheelchair propulsion/positioning OT Self Feeding Anticipated Outcome(s): modified independent OT Basic Self-Care Anticipated Outcome(s): supervision to min assist OT Toileting Anticipated Outcome(s): min guard assist OT Bathroom Transfers Anticipated Outcome(s): min to min guard assist OT Recommendation Recommendations for Other Services: Neuropsych consult;Therapeutic Recreation consult Therapeutic Recreation Interventions: Stress management Patient  destination: Home Follow Up Recommendations: Outpatient OT Equipment Recommended: To be determined   OT Evaluation Precautions/Restrictions  Precautions Precautions: Fall Precaution Comments: [name], fluctuating tone in the LUE Restrictions Weight Bearing Restrictions: No  Pain  See Pain Flowsheet for details  Home Living/Prior [place] expects to be discharged to:: Private residence Living Arrangements: Parent Available Help at Discharge: Family, Available 24 hours/day Type of Home: House Home Access: Level entry Home Layout: Two level, Able to live on main level with bedroom/bathroom Alternate Level Stairs-Rails: Right Bathroom Shower/Tub: Tub/shower  unit Bathroom Toilet: Standard Bathroom Accessibility: Yes  Lives With: Family (lives with his mother and father) IADL History Homemaking Responsibilities: No Current License: Yes Mode of Transportation: Musician Occupation: Retired Prior Function Level of Independence: Independent with basic ADLs, Independent with homemaking with ambulation Driving: Yes Comments: retired due to Animal nutritionist, indep with ADLs Vision Baseline Vision/History: Wears glasses Wears Glasses: At all times Patient Visual Report: No change from baseline (prisms in glasses secondary to history of diplopia from initial brain surgery) Vision Assessment?: Yes Ocular Range of Motion: Within Functional Limits Alignment/Gaze Preference: Chin down Tracking/Visual Pursuits: Decreased smoothness of horizontal tracking;Decreased smoothness of vertical tracking;Other (comment) (delays in tracking at times, to be further examined) Convergence: Other (comment) (left eye does not converge) Visual Fields: No apparent deficits Diplopia Assessment:  (history of diplopia but pt reports not having any this session when his glasses were removed) Perception  Perception: Within Functional Limits Praxis Praxis:  Impaired Praxis Impairment Details: Motor planning Cognition Overall Cognitive Status: Impaired/Different from baseline Arousal/Alertness: Awake/alert Orientation Level: Person;Place;Situation Person: Oriented Place: Oriented Situation: Oriented Year: 2021 Month: November Day of Week: Correct Memory: Impaired Memory Impairment: Decreased short term memory Decreased Short Term Memory: Verbal basic Immediate Memory Recall: Sock;Blue;Bed Memory Recall Sock: Not able to recall Memory Recall Blue: Without Cue Memory Recall Bed: Not able to recall Attention: Sustained;Selective Focused Attention: Appears intact Sustained Attention: Appears intact Selective Attention: Impaired Selective Attention Impairment: Verbal basic;Functional basic Awareness: Impaired Awareness Impairment: Emergent impairment;Intellectual impairment;Anticipatory impairment Problem Solving: Impaired Problem Solving Impairment: Verbal basic;Functional basic Executive Function: Reasoning;Organizing Reasoning: Impaired Reasoning Impairment: Verbal complex Organizing: Impaired Organizing Impairment: Functional basic Behaviors: Restless;Other (comment) (anxious) Safety/Judgment: Impaired Comments: Pt would attempt to stand prior to therapist being in safe position to assist him. Sensation Sensation Light Touch: Appears Intact Proprioception: Impaired Detail Proprioception Impaired Details: Impaired LUE;Impaired LLE Stereognosis: Not tested Additional Comments: Proprioception more impaired in the hand and wrist with gross testing. Coordination Gross Motor Movements are Fluid and Coordinated: No Fine Motor Movements are Fluid and Coordinated: No Coordination and Movement Description: Pt currently Brunnstrum stage III in the left arm and hand.  He needs max assist to integrate into basic selfcare tasks at this time. Heel Shin Test: mild ataxia on the L Motor  Motor Motor: Hemiplegia;Ataxia;Motor  perseverations;Motor impersistence;Abnormal postural alignment and control Motor - Skilled Clinical Observations: LUE and LLE hemparesis  Trunk/Postural Assessment  Cervical Assessment Cervical Assessment: Exceptions to [facility] (maintains cervical flexion) Thoracic Assessment Thoracic Assessment: Exceptions to [facility] (thoracic rounding with slight scapular winging in the left shoulder) Lumbar Assessment Lumbar Assessment: Exceptions to [facility] (posterior pelvic tilt) Postural Control Postural Control: Deficits on evaluation Righting Reactions: delayed Protective Responses: pushers syndrome  Balance Balance Balance Assessed: Yes Static Sitting Balance Static Sitting - Balance Support: Feet supported Static Sitting - Level of Assistance: 4: Min assist Dynamic Sitting Balance Dynamic Sitting - Balance Support: No upper extremity supported;During functional activity Dynamic Sitting - Level of Assistance: 3: Mod assist Static Standing Balance Static Standing - Balance Support: During functional activity Static Standing - Level of Assistance: 3: Mod assist Dynamic Standing Balance Dynamic Standing - Balance Support: During functional activity Dynamic Standing - Level of Assistance: 2: Max assist Extremity/Trunk Assessment RUE Assessment RUE Assessment: Within Functional Limits Passive Range of Motion (PROM) Comments: PROM WFLS for all joints Active Range of Motion (AROM) Comments: AROM WFLS for all joints General Strength Comments: strength 5/5 throughout RUE Tone RUE Tone: Within Functional Limits LUE Assessment LUE Assessment:  Exceptions to [facility] Passive Range of Motion (PROM) Comments: PROM WFLS for shoulder, elbow, and digits.   Slight flexor tone noted in the digits. Active Range of Motion (AROM) Comments: Brunnstrum stage III level in the arm and hand. General Strength Comments: Pt needs max assist for use as a stabilizer during selfcare tasks.  He can exhibit gross digit flexion to 70% of  normal with only trace digit extension. Flexor synergy noted in the arm with attempted movement  Care Tool Care Tool Self Care Eating   Eating Assist Level: Set up assist    Oral Care    Oral Care Assist Level: Supervision/Verbal cueing    Bathing   Body parts bathed by patient: Left arm;Chest;Abdomen;Right upper leg;Left upper leg;Right lower leg;Left lower leg;Face Body parts bathed by helper: Right arm;Front perineal area;Buttocks   Assist Level: Moderate Assistance - Patient 50 - 74%    Upper Body Dressing(including orthotics)   What is the patient wearing?: Pull over shirt   Assist Level: Moderate Assistance - Patient 50 - 74%    Lower Body Dressing (excluding footwear)   What is the patient wearing?: Pants;Incontinence brief Assist for lower body dressing: Maximal Assistance - Patient 25 - 49%    Putting on/Taking off footwear   What is the patient wearing?: Ted hose;Non-skid slipper socks Assist for footwear: Maximal Assistance - Patient 25 - 49%       Care Tool Toileting Toileting activity   Assist for toileting: 2 Helpers     Care Tool Bed Mobility Roll left and right activity   Roll left and right assist level: Minimal Assistance - Patient > 75%    Sit to lying activity   Sit to lying assist level: Moderate Assistance - Patient 50 - 74%    Lying to sitting edge of bed activity   Lying to sitting edge of bed assist level: Maximal Assistance - Patient 25 - 49%     Care Tool Transfers Sit to stand transfer   Sit to stand assist level: Moderate Assistance - Patient 50 - 74%    Chair/bed transfer   Chair/bed transfer assist level: Maximal Assistance - Patient 25 - 49% (stand pivot)     Toilet transfer   Assist Level: 2 Helpers     Care Tool Cognition Expression of Ideas and Wants Expression of Ideas and Wants: Some difficulty - exhibits some difficulty with expressing needs and ideas (e.g, some words or finishing thoughts) or speech is not clear    Understanding Verbal and Non-Verbal Content Understanding Verbal and Non-Verbal Content: Understands (complex and basic) - clear comprehension without cues or repetitions   Memory/Recall Ability *first 3 days only Memory/Recall Ability *first 3 days only: Current season;That he or she is in a hospital/hospital unit    Refer to Care Plan for [place] 1 OT Short Term Goal 1 (Week 1): Pt will complete UB dressing with min assist for two consecutive sessions. OT Short Term Goal 2 (Week 1): Pt will complete LB dressing with mod assist sit to stand for two consecutive sessions. OT Short Term Goal 3 (Week 1): Pt will complete LB bathing with min assist sit to stand for two consecutive sessions. OT Short Term Goal 4 (Week 1): Pt will complete self AAROM exercises for the LUE with supervision following handout.  Recommendations for other services: Neuropsych and Therapeutic Recreation  Stress management   Skilled Therapeutic Intervention ADL ADL Eating: Set up Where Assessed-Eating: Wheelchair Grooming:  Supervision/safety Where Assessed-Grooming: Wheelchair Upper Body Bathing: Minimal assistance Where Assessed-Upper Body Bathing: Edge of bed Lower Body Bathing: Moderate assistance Where Assessed-Lower Body Bathing: Edge of bed Upper Body Dressing: Moderate assistance Where Assessed-Upper Body Dressing: Edge of bed Lower Body Dressing: Maximal assistance Where Assessed-Lower Body Dressing: Edge of bed Toileting: Maximal assistance Where Assessed-Toileting: Bedside Commode Toilet Transfer: Maximal assistance Toilet Transfer Method: Stand pivot Science writer: Radiographer, therapeutic: Not assessed Social research officer, government: Not assessed Mobility  Bed Mobility Bed Mobility: Supine to Sit Supine to Sit: Maximal Assistance - Patient - Patient 25-49% Transfers Sit to Stand: Moderate Assistance - Patient 50-74% Session Note:  Pt eager to  participate in session.  He was able to transfer to the EOB with max assist initially secondary to posterior LOB.  As pt would attempt to transition to sitting, he would repeatedly say "I got it" even though he fell backwards on the bed.  Once in sitting, he was able to maintain his balance while bathing and dressing with min assist.  Decreased thoroughness with bathing, requiring max instructional cueing initially for sequencing task and putting soap on the washcloth.  Min assist for initial sit to stand from the EOB when completing washing peri area and buttocks as well as when pulling items over hips.  Mod assist to also take a few steps to the wheelchair with hand held assist.  When attempting to stand again and complete stand pivot transfer with therapist in front, he needed max assist however.  Noted increased neck and trunk flexion when sitting EOB as well.  Pt able to state that his left arm and leg are weak when asked, but stated "If I can just get this hand going, I'd be alright.", demonstrating limited awareness of the extent of his arm weakness.  Finished session with pt in the bed and with the call button and phone in reach.  Safety belt in place as well.  Discussed expectations of LOS of approximately 2.5-3 weeks and he was in agreement.    Discharge Criteria: Patient will be discharged from OT if patient refuses treatment 3 consecutive times without medical reason, if treatment goals not met, if there is a change in medical status, if patient makes no progress towards goals or if patient is discharged from hospital.  The above assessment, treatment plan, treatment alternatives and goals were discussed and mutually agreed upon: by patient  Cledis Sohn OTR/L 10/01/2020, 5:35 PM

## 2020-10-01 NOTE — Evaluation (Signed)
Physical Therapy Assessment and Plan  Patient Details  Name: Howard White MRN: [id number] Date of Birth: [birth year]  PT Diagnosis: Abnormal posture, Abnormality of gait, Ataxia, Ataxic gait, Coordination disorder, Hemiplegia non-dominant, Hypertonia, Impaired cognition, Impaired sensation and Muscle weakness Rehab Potential: Good ELOS: 18-21 days   Today's Date: 10/01/2020 PT Individual Time: 4098-1191 PT Individual Time Calculation (min): 30 min    Hospital Problem: Principal Problem:   Cerebrovascular accident (CVA) of right basal ganglia ([place])   Past Medical History:  Past Medical History:  Diagnosis Date  . Brain cancer ([place])   . Seizures ([place])   . Thyroid disease    Hypothyroid   Past Surgical History:  Past Surgical History:  Procedure Laterality Date  . brain cancer    . 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  Per chart review patient lives with elderly parents.  Reportedly independent prior to admission.  Presented 09/26/2020 left-sided weakness facial droop.  Cranial CT scan showed no acute intracranial abnormality or significant interval change.  Left frontal craniotomy with resection cavity noted.  CT angiogram of head and neck without large vessel occlusion or limiting proximal stenosis.  MRI of the brain showed a 1.9 x 1.2 cm acute infarct extending from the right corona radiata into the right basal ganglia.  Echocardiogram with ejection fraction of 60 to 65% no wall motion abnormalities.  Patient transferred to CIR on 09/30/2020 .   Patient currently requires max with mobility secondary to muscle weakness, muscle joint tightness and muscle paralysis, decreased cardiorespiratoy endurance, abnormal tone, motor  apraxia, ataxia, decreased coordination and decreased motor planning, decreased midline orientation, decreased attention to left, decreased motor planning and ideational apraxia, decreased attention, decreased awareness, decreased problem solving, decreased safety awareness, decreased memory and delayed processing and decreased sitting balance, decreased standing balance, decreased postural control, hemiplegia and decreased balance strategies.  Prior to hospitalization, patient was independent  with mobility and lived with   in a   home.  Home access is   .  Patient will benefit from skilled PT intervention to maximize safe functional mobility, minimize fall risk and decrease caregiver burden for planned discharge home with 24 hour assist.  Anticipate patient will benefit from follow up [name] at discharge.  PT - End of Session Activity Tolerance: Tolerates 10 - 20 min activity with multiple rests Endurance Deficit: Yes PT Assessment Rehab Potential (ACUTE/IP ONLY): Good PT Barriers to Discharge: Home environment access/layout;Decreased caregiver support;Insurance for SNF coverage;Medication compliance;Behavior PT Patient demonstrates impairments in the following area(s): Nutrition;Balance;Pain;Behavior;Edema;Perception;Endurance;Motor;Safety;Sensory;Skin Integrity PT Transfers Functional Problem(s): Bed Mobility;Bed to Chair;Car;Furniture PT Locomotion Functional Problem(s): Ambulation;Wheelchair Mobility;Stairs PT Plan PT Intensity: Minimum of 1-2 x/day ,45 to 90 minutes PT Frequency: 5 out of 7 days PT Duration Estimated Length of Stay: 18-21 days PT Treatment/Interventions: Ambulation/gait training;Balance/vestibular training;Cognitive remediation/compensation;Community reintegration;Discharge planning;Disease management/prevention;DME/adaptive equipment instruction;Functional electrical stimulation;Functional mobility training;Neuromuscular re-education;Pain management;Patient/family  education;Psychosocial support;Skin care/wound management;Splinting/orthotics;Therapeutic Activities;Therapeutic Exercise;Stair training;UE/LE Coordination activities;UE/LE Strength taining/ROM;Visual/perceptual remediation/compensation;Wheelchair propulsion/positioning PT Transfers Anticipated Outcome(s): CGA to and from [facility] PT Locomotion Anticipated Outcome(s): supervision assist WC mobility. min assist ambulatory house hold distance PT Recommendation Recommendations for Other Services: Therapeutic Recreation consult Therapeutic Recreation Interventions: Stress management;Outing/community reintergration Follow Up Recommendations: Home health PT Patient destination: Home Equipment Recommended: Rolling walker with 5" wheels;Wheelchair (  measurements);Wheelchair cushion (measurements);To be determined   PT Evaluation Precautions/Restrictions   General   Vital SignsTherapy Vitals Temp: 98 F (36.7 C) Pulse Rate: 84 Resp: 17 BP: (!) 131/91 Patient Position (if appropriate): Lying Oxygen Therapy SpO2: 96 % O2 Device: Room Air Pain    Denies Home Living/Prior Functioning Home Living Available Help at Discharge: Family;Available 24 hours/day Type of Home: House Home Access: Level entry Home Layout: Two level;Able to live on main level with bedroom/bathroom Alternate Level Stairs-Number of Steps: flight Alternate Level Stairs-Rails: Right Bathroom Shower/Tub: Chiropodist: Standard Bathroom Accessibility: Yes  Lives With: Family (lives with his mother and father) Prior Function Level of Independence: Independent with basic ADLs;Independent with homemaking with ambulation Driving: Yes Comments: retired due to Animal nutritionist, indep with ADLs Vision/Perception  Vision - Assessment Ocular Range of Motion: Within Functional Limits Alignment/Gaze Preference: Chin down;Gaze right Tracking/Visual Pursuits: Decreased smoothness of horizontal tracking;Decreased  smoothness of vertical tracking;Other (comment) Praxis Praxis: Impaired Praxis Impairment Details: Motor planning;Perseveration  Cognition Overall Cognitive Status: Impaired/Different from baseline Arousal/Alertness: Awake/alert Attention: Sustained;Selective Focused Attention: Appears intact Sustained Attention: Appears intact Selective Attention: Impaired Selective Attention Impairment: Verbal basic;Functional basic Memory: Impaired Memory Impairment: Decreased short term memory Decreased Short Term Memory: Verbal basic Immediate Memory Recall: Sock;Blue;Bed Memory Recall Sock: Not able to recall Memory Recall Blue: Without Cue Memory Recall Bed: Not able to recall Awareness: Impaired Awareness Impairment: Emergent impairment;Intellectual impairment;Anticipatory impairment Problem Solving: Impaired Problem Solving Impairment: Verbal basic;Functional basic Behaviors: Restless;Other (comment) (anxious) Safety/Judgment: Impaired Comments: Pt would attempt to stand prior to therapist being in safe position to assist him. Sensation Sensation Light Touch: Appears Intact Proprioception: Impaired Detail Proprioception Impaired Details: Impaired LUE;Impaired LLE Coordination Gross Motor Movements are Fluid and Coordinated: No Fine Motor Movements are Fluid and Coordinated: No Coordination and Movement Description: mild ataxia on the L Heel Shin Test: mild ataxia on the L Motor  Motor Motor: Hemiplegia;Ataxia;Motor perseverations;Motor impersistence;Abnormal postural alignment and control Motor - Skilled Clinical Observations: L side hemiplegia, UE>LE. ataxia, apraxia   Trunk/Postural Assessment  Cervical Assessment Cervical Assessment: Exceptions to [facility] (lateral rotation and mild R diviation) Thoracic Assessment Thoracic Assessment: Exceptions to [facility] (constant forward flexion, but able to correct with cues) Lumbar Assessment Lumbar Assessment: Exceptions to [facility] (decreased  lordosis) Postural Control Postural Control: Deficits on evaluation Righting Reactions: delayed Protective Responses: pushers syndrome  Balance Balance Balance Assessed: Yes Static Sitting Balance Static Sitting - Balance Support: Feet supported Static Sitting - Level of Assistance: 4: Min assist Dynamic Sitting Balance Dynamic Sitting - Balance Support: No upper extremity supported;During functional activity Dynamic Sitting - Level of Assistance: 3: Mod assist Static Standing Balance Static Standing - Balance Support: During functional activity Static Standing - Level of Assistance: 3: Mod assist Dynamic Standing Balance Dynamic Standing - Balance Support: During functional activity Dynamic Standing - Level of Assistance: 2: Max assist Extremity Assessment      RLE Assessment RLE Assessment: Within Functional Limits General Strength Comments: grossly 5/5 LLE Assessment LLE Assessment: Exceptions to [facility] General Strength Comments: grossly 4/5 proximal to distal except knee flexion 3+/5.  Care Tool Care Tool Bed Mobility Roll left and right activity   Roll left and right assist level: Minimal Assistance - Patient > 75%    Sit to lying activity   Sit to lying assist level: Moderate Assistance - Patient 50 - 74%    Lying to sitting edge of bed activity   Lying to sitting edge of bed assist level:  Moderate Assistance - Patient 50 - 74%     Care Tool Transfers Sit to stand transfer   Sit to stand assist level: Maximal Assistance - Patient 25 - 49%    Chair/bed transfer   Chair/bed transfer assist level: Maximal Assistance - Patient 25 - 49%     Toilet transfer   Assist Level: 2 Production assistant, radio transfer activity did not occur: Environmental limitations        Care Tool Locomotion Ambulation   Assist level: Maximal Assistance - Patient 25 - 49% Assistive device: No Device Max distance: 25  Walk 10 feet activity   Assist level: Maximal Assistance -  Patient 25 - 49% Assistive device: No Device   Walk 50 feet with 2 turns activity Walk 50 feet with 2 turns activity did not occur: Safety/medical concerns      Walk 150 feet activity Walk 150 feet activity did not occur: Safety/medical concerns      Walk 10 feet on uneven surfaces activity Walk 10 feet on uneven surfaces activity did not occur: Safety/medical concerns      Stairs Stair activity did not occur: Safety/medical concerns        Walk up/down 1 step activity Walk up/down 1 step or curb (drop down) activity did not occur: Safety/medical concerns     Walk up/down 4 steps activity did not occuR: Safety/medical concerns  Walk up/down 4 steps activity      Walk up/down 12 steps activity Walk up/down 12 steps activity did not occur: Safety/medical concerns      Pick up small objects from floor Pick up small object from the floor (from standing position) activity did not occur: Safety/medical concerns      Wheelchair   Type of Wheelchair: Manual   Wheelchair assist level: Moderate Assistance - Patient 50 - 74% Max wheelchair distance: 100  Wheel 50 feet with 2 turns activity   Assist Level: Moderate Assistance - Patient 50 - 74%  Wheel 150 feet activity   Assist Level: Maximal Assistance - Patient 25 - 49%    Refer to Care Plan for Long Term Goals  SHORT TERM GOAL WEEK 1 PT Short Term Goal 1 (Week 1): Pt will transfer to and from [facility] with mod assist consistently PT Short Term Goal 2 (Week 1): Pt will propell WC 157f with min assist PT Short Term Goal 3 (Week 1): Pt will ambulater 371fwith mod assist and LRAD  Recommendations for other services: Therapeutic Recreation  Kitchen group and Stress management  Skilled Therapeutic Intervention Mobility Bed Mobility Bed Mobility: Rolling Right;Rolling Left;Sit to Supine;Supine to Sit Rolling Right: Minimal Assistance - Patient > 75% Rolling Left: Supervision/Verbal cueing Supine to Sit: Moderate Assistance - Patient  50-74% Sit to Supine: Supervision/Verbal cueing Transfers Transfers: Sit to Stand;Stand Pivot Transfers;Squat Pivot Transfers Sit to Stand: Moderate Assistance - Patient 50-74% Stand Pivot Transfers: Maximal Assistance - Patient 25 - 49%;Moderate Assistance - Patient 50 - 74% Squat Pivot Transfers: Maximal Assistance - Patient 25-49%;Moderate Assistance - Patient 50-74% Locomotion  Gait Ambulation: Yes Gait Assistance: Maximal Assistance - Patient 25-49% Gait Distance (Feet): 25 Feet (LUE over therapist shoulder) Assistive device: None Gait Gait: Yes Gait Pattern: Impaired Gait Pattern: Step-to pattern;Decreased step length - left;Decreased stance time - left;Lateral trunk lean to left;Ataxic;Narrow base of support Stairs / Additional Locomotion Stairs: No WhArchitectYes Wheelchair Assistance: Moderate Assistance - Patient 50 - 74% Wheelchair Propulsion: Right upper extremity;Right lower extremity Distance:  100   Pt received sitting on toilet and agreeable to PT. Pt attempted to void bowel but only able to void bladder. Returned to [facility] with stedy and min-mod assist. PT instructed patient in PT Evaluation and initiated treatment intervention; see below for results. PT educated patient in [place], rehab potential, rehab goals, and discharge recommendations.  Sit<>stand with min-max assist depending on distractions and attention to task. Gait training without Ad as listed above and with RW and L hand splint x 5 ft with max assist; noted increased pushers response with WB through UE. Patient returned to room and left sitting in [facility] with call bell in reach and all needs met.        Discharge Criteria: Patient will be discharged from PT if patient refuses treatment 3 consecutive times without medical reason, if treatment goals not met, if there is a change in medical status, if patient makes no progress towards goals or if patient is discharged from hospital.  The  above assessment, treatment plan, treatment alternatives and goals were discussed and mutually agreed upon: by patient  Lorie Phenix 10/01/2020, 4:09 PM

## 2020-10-01 NOTE — Patient Care Conference (Signed)
Inpatient RehabilitationTeam Conference and Plan of Care Update Date: 10/01/2020   Time: 10:53 AM    Patient Name: Howard White      Medical Record Number: [id number]  Date of Birth: January 24, 1961 Sex: Male         Room/Bed: 4W13C/4W13C-01 Payor Info: Payor: [employer] / Plan: [employer] Must PPO / Product Type: *No Product type* /    Admit Date/Time:  09/30/2020  6:21 PM  Primary Diagnosis:  Cerebrovascular accident (CVA) of right basal ganglia [facility])  Hospital Problems: Principal Problem:   Cerebrovascular accident (CVA) of right basal ganglia [facility])    Expected Discharge Date: Expected Discharge Date:  (2-3 weeks; initial evals pending)  Team Members Present: Physician leading conference: Dr. Alysia Penna Care Coodinator Present: Dorien Chihuahua, RN, BSN, CRRN;Christina Nixon, [place] Nurse Present: Annita Brod, LPN PT Present: Estevan Ryder, PT OT Present: Clyda Greener, OT SLP Present: Jettie Booze, CF-SLP PPS Coordinator present : Ileana Ladd, Burna Mortimer, SLP     Current Status/Progress Goal Weekly Team Focus  Bowel/Bladder   continent of bowel and bladder LBM 11/14  remain contient. timed toileting whie awake  assess toiletinds qshift and PRN.   Swallow/Nutrition/ Hydration   eval pending         ADL's             Mobility   eval pending         Communication   eval pending         Safety/Cognition/ Behavioral Observations  eval pending         Pain   no c/o pain  remain pain free  assess pain qshift and PRN   Skin   No skin impairments  remain free of skin impairments  Assess skin qshift and PRN     Discharge Planning:      Team Discussion: Patient was independent prior to right subcortical CVA. History of astrocytoma removal and preadmission baseline level unclear. BP is stable although CVC slightly elevated without a clear source for cause. MD monitoring labs. Patient with constipation and urinary urgency.  Insomnia treated  with melatonin. Progress limited by poor awareness, visual deficits and difficulty tracking objects, impulsivity, cognitive issues and increased tone. Patient on target to meet rehab goals: yes, min assist goals set  *See Care Plan and progress notes for long and short-term goals.   Revisions to Treatment Plan:   Teaching Needs: Transfers, toileting, safety related to impulsivity, medications, etc.  Current Barriers to Discharge: Decreased caregiver availability, home access/layout, behavior Two level home  Possible Resolutions to Barriers: Confirm changes from baseline Family education     Medical Summary Current Status: hx left brain astrocytoma with seizure disorder, New RIght subcortical infarct with Left hemiparesis  Barriers to Discharge: Medical stability;Other (comments)  Barriers to Discharge Comments: seizure d/o following brain cancer resection Possible Resolutions to Barriers/Weekly Focus: establish baseline cognition   Continued Need for Acute Rehabilitation Level of Care: The patient requires daily medical management by a physician with specialized training in physical medicine and rehabilitation for the following reasons: Direction of a multidisciplinary physical rehabilitation program to maximize functional independence : Yes Medical management of patient stability for increased activity during participation in an intensive rehabilitation regime.: Yes Analysis of laboratory values and/or radiology reports with any subsequent need for medication adjustment and/or medical intervention. : Yes   I attest that I was present, lead the team conference, and concur with the assessment and plan of the team.   Dorien Chihuahua  B 10/01/2020, 2:09 PM

## 2020-10-01 NOTE — Progress Notes (Signed)
Physical Therapy Session Note  Patient Details  Name: Howard White MRN: [id number] Date of Birth: 12/06/1960  Today's Date: 10/01/2020 PT Individual Time: 1445-1515 PT Individual Time Calculation (min): 30 min   Short Term Goals: No short term goals set  Skilled Therapeutic Interventions/Progress Updates:    Patient received supine in bed, agreeable to PT. He denies pain, but endorses fatigue, stating he didn't sleep well last night. Patient requesting to use bathroom and attempting to climb over bed rail while PT was trying to set up wc for transfer into bathroom. PT able to redirect patient. He required ModA for bed mobility to come to sitting edge of bed and ModA to maintain balance edge of bed. Patient with persistent forward flexed head demonstrating poor postural control and limited neck extensor endurance as he was only able to hold his head up for a few seconds at a time. Patient with frequent LOB of balance backwards while sitting edge of bed. He demonstrates impulsivity attempting to transfer before PT full able to assist. MaxA squat pivot to wc. He required MaxA + assist from second person for clothing management for transfer to toilet. He requires consistent and explicit cues for hand placement during transfers. Patient able to have bowel movement, but was found to be straining significantly, which began to elicit extensor tone in B LE and trunk as well as flexor tone in L UE. Patient requiring CGA while seated on toilet due to this increase in tone related to his straining. Patient requiring MaxA to remain standing and TotalA for clothing management and peri-hygiene in standing. MaxA transfer to wc and MaxA squat pivot back to bed. He did require ModA to transition from sit > supine. Bed alarm on, call light within reach.   Therapy Documentation Precautions:  Restrictions Weight Bearing Restrictions: No    Therapy/Group: Individual Therapy  Karoline Caldwell,  PT, DPT, CBIS  10/01/2020, 7:51 AM

## 2020-10-01 NOTE — Progress Notes (Signed)
Inpatient Rehabilitation  Patient information reviewed and entered into eRehab system by Maelyn Berrey M. Irania Durell, M.A., CCC/SLP, PPS Coordinator.  Information including medical coding, functional ability and quality indicators will be reviewed and updated through discharge.    

## 2020-10-02 ENCOUNTER — Inpatient Hospital Stay (HOSPITAL_COMMUNITY): Payer: PPO | Admitting: Physical Therapy

## 2020-10-02 ENCOUNTER — Inpatient Hospital Stay (HOSPITAL_COMMUNITY): Payer: PPO | Admitting: Speech Pathology

## 2020-10-02 ENCOUNTER — Inpatient Hospital Stay (HOSPITAL_COMMUNITY): Payer: PPO | Admitting: Occupational Therapy

## 2020-10-02 MED ORDER — DOCUSATE SODIUM 100 MG PO CAPS
200.0000 mg | ORAL_CAPSULE | Freq: Two times a day (BID) | ORAL | Status: DC
  Administered 2020-10-02 – 2020-10-29 (×54): 200 mg via ORAL
  Filled 2020-10-02 (×54): qty 2

## 2020-10-02 MED ORDER — MELATONIN 5 MG PO TABS
5.0000 mg | ORAL_TABLET | Freq: Every day | ORAL | Status: DC
  Administered 2020-10-02 – 2020-10-06 (×5): 5 mg via ORAL
  Filled 2020-10-02 (×5): qty 1

## 2020-10-02 NOTE — Progress Notes (Signed)
Physical Therapy Session Note  Patient Details  Name: Howard White MRN: [id number] Date of Birth: 09-27-1961  Today's Date: 10/02/2020 PT Individual Time: 0805-0905 PT Individual Time Calculation (min): 60 min   Short Term Goals: Week 1:  PT Short Term Goal 1 (Week 1): Pt will transfer to and from [facility] with mod assist consistently PT Short Term Goal 2 (Week 1): Pt will propell WC 160ft with min assist PT Short Term Goal 3 (Week 1): Pt will ambulater 29ft with mod assist and LRAD  Skilled Therapeutic Interventions/Progress Updates:    Pt received supine in bed with noted R lateral trunk deviation - pt eager to participate in therapy session with many questions regarding his rehab potential. Supine>sitting R EOB with mod assist for L hemibody management - demos L LE extensor tone. Sitting EOB demos significant trunk and cervical flexion with poor endurance of trunk extensor muscles. Donned shirt, pants, and shoes max assist with cuing for sequencing. Sit>stand EOB>R UE support on bedrail with min assist for balance - pulled pants up over hips total assist. L squat pivot EOB>w/c with mod assist - max cuing for head/hips relationship and sequencing.  Transported to/from gym in w/c for time management and energy conservation. Therapist retrieved pt a smaller w/c for improved fit along with a Roho cushion for improved pressure relief and different back for increased support of trunk in midline due to observed posture sitting EOB. R squat pivot w/c>EOM with mod assist for lifting/pivoting hips. Sitting EOM provided mirror feedback for improved upright trunk posture with midline orientation. Sit<>stands to/from EOM, no AD, with min assist for lifting and CGA/min assist for balance in standing - therapist placed LUE on table to promote increased weightbearing and ROM into extension due to noted flexor tone - dynamic standing balance task via reaching to place horseshoes on top of mirror to promote further  anterior weight shift and WBing in L UE - min assist for balance with pt having decreased awareness to bring weight back to midline after reaching forward. Gait training 183ft with +2 HHA and +2 min assist for balance - demos ability to advance L LE during swing with cuing for heel strike, demos activation of L quad during stance with good knee control, has poor trunk alignment with R posterior trunk rotation during ambulation. Transported back to room and with encouragement, agreeable to remain in w/c - left seated in w/c with needs in reach and seat belt alarm on.  Therapy Documentation Precautions:  Precautions Precautions: Fall Precaution Comments: [name], fluctuating tone in the LUE Restrictions Weight Bearing Restrictions: No  Pain: No reports of pain throughout session.   Therapy/Group: Individual Therapy  Tawana Scale , PT, DPT, CSRS  10/02/2020, 7:49 AM

## 2020-10-02 NOTE — Progress Notes (Signed)
   Patient Details  Name: Howard White MRN: [id number] Date of Birth: 23-Sep-1961  Today's Date: 10/02/2020  Hospital Problems: Principal Problem:   Cerebrovascular accident (CVA) of right basal ganglia [facility])  Past Medical History:  Past Medical History:  Diagnosis Date  . Brain cancer ([place])   . Seizures ([place])   . Thyroid disease    Hypothyroid   Past Surgical History:  Past Surgical History:  Procedure Laterality Date  . brain cancer    . BRAIN SURGERY     Social History:  reports that he has never smoked. He has never used smokeless tobacco. He reports current alcohol use. He reports that he does not use drugs.  Family / Support Systems Marital Status: Single Spouse/Significant Other: none Children: none Other Supports: mother Optometrist) Anticipated Caregiver: Pam (spouse) Ability/Limitations of Caregiver: Min A Caregiver Availability: 24/7  Social History Preferred language: English Religion: Protestant Read: Yes Write: Yes Employment Status: Retired   Abuse/Neglect Abuse/Neglect Assessment Can Be Completed: Yes Physical Abuse: Denies Verbal Abuse: Denies Sexual Abuse: Denies Exploitation of patient/patient's resources: Denies Self-Neglect: Denies  Emotional Status Pt's affect, behavior and adjustment status: no Recent Psychosocial Issues: no Psychiatric History: no Substance Abuse History: no  Patient / Family Perceptions, Expectations & Goals Pt/Family understanding of illness & functional limitations: ye spatient mother has been provided with updates Premorbid pt/family roles/activities: Patient active in community and Independent Anticipated changes in roles/activities/participation: Family will contiue to provide assistance Pt/family expectations/goals: [place]: None Premorbid Home Care/DME Agencies: None Transportation available at discharge: family able to transport  Discharge Planning Living Arrangements:  Parent Support Systems: Parent Type of Residence: Private residence (2 level home (able to stay on 1st level, R side rails) 15 steps to 2nd level. No steps to enter) Insurance Resources: Multimedia programmer (specify) (HTA) Financial Resources: SSD Financial Screen Referred: No Living Expenses: Lives with family Money Management: Patient Does the patient have any problems obtaining your medications?: No Home Management: Required some assistance (medications and shopping task) Patient/Family Preliminary Plans: Mother will continue to assist Care Coordinator Anticipated Follow Up Needs: HH/OP Expected length of stay: 12-14 Days  Clinical Impression SW entered room. Introduced self, explained role and process. SW will continue to follow up with questions and concerns   [name] 10/02/2020, 12:04 PM

## 2020-10-02 NOTE — IPOC Note (Signed)
Overall Plan of Care [facility]) Patient Details Name: Howard White MRN: [id number] DOB: 06/03/1961  Admitting Diagnosis: Cerebrovascular accident (CVA) of right basal ganglia [facility])  Hospital Problems: Principal Problem:   Cerebrovascular accident (CVA) of right basal ganglia ([place])     Functional Problem List: Nursing Endurance, Medication Management, Motor, Nutrition  PT Nutrition, Balance, Pain, Behavior, Edema, Perception, Endurance, Motor, Safety, Sensory, Skin Integrity  OT Balance, Cognition, Endurance, Motor, Safety, Perception, Sensory  SLP Cognition, Linguistic  TR         Basic ADL's: OT Eating, Grooming, Bathing, Dressing, Toileting     Advanced  ADL's: OT       Transfers: PT Bed Mobility, Bed to Chair, Car, Manufacturing systems engineer, Metallurgist: PT Ambulation, Emergency planning/management officer, Stairs     Additional Impairments: OT Fuctional Use of Upper Extremity  SLP Communication, Social Cognition expression Problem Solving, Memory, Attention  TR      Anticipated Outcomes Item Anticipated Outcome  Self Feeding modified independent  Swallowing      Basic self-care  supervision to min assist  Toileting  min guard assist   Bathroom Transfers min to min guard assist  Bowel/Bladder  manage bowel and bladder with mod I assist  Transfers  CGA to and from [facility]  Locomotion  supervision assist WC mobility. min assist ambulatory house hold distance  Communication  Supervision  Cognition  Supervision  Pain  pain level less than 4 on scale of 0-10  Safety/Judgment  Remain free of injury, prevent falls with mod I assist   Therapy Plan: PT Intensity: Minimum of 1-2 x/day ,45 to 90 minutes PT Frequency: 5 out of 7 days PT Duration Estimated Length of Stay: 18-21 days OT Intensity: Minimum of 1-2 x/day, 45 to 90 minutes OT Frequency: 5 out of 7 days OT Duration/Estimated Length of Stay: 19-21 days SLP Intensity: Minumum of 1-2 x/day, 30 to 90 minutes SLP  Frequency: 3 to 5 out of 7 days SLP Duration/Estimated Length of Stay: 2.5-3 weeks   Due to the current state of emergency, patients may not be receiving their 3-hours of Medicare-mandated therapy.   Team Interventions: Nursing Interventions Patient/Family Education, Medication Management, Discharge Planning, Psychosocial Support, Disease Management/Prevention, Pain Management  PT interventions Ambulation/gait training, Training and development officer, Cognitive remediation/compensation, Community reintegration, Discharge planning, Disease management/prevention, DME/adaptive equipment instruction, Functional electrical stimulation, Functional mobility training, Neuromuscular re-education, Pain management, Patient/family education, Psychosocial support, Skin care/wound management, Splinting/orthotics, Therapeutic Activities, Therapeutic Exercise, Stair training, UE/LE Coordination activities, UE/LE Strength taining/ROM, Visual/perceptual remediation/compensation, Wheelchair propulsion/positioning  OT Interventions Training and development officer, Cognitive remediation/compensation, Academic librarian, Engineer, drilling, Disease mangement/prevention, Discharge planning, Functional electrical stimulation, Pain management, UE/LE Coordination activities, Therapeutic Activities, Self Care/advanced ADL retraining, Functional mobility training, Patient/family education, Therapeutic Exercise, Visual/perceptual remediation/compensation, Neuromuscular re-education, Psychosocial support, Splinting/orthotics, UE/LE Strength taining/ROM, Wheelchair propulsion/positioning  SLP Interventions Cognitive remediation/compensation, Cueing hierarchy, Speech/Language facilitation, Internal/external aids, Patient/family education, Functional tasks  TR Interventions    SW/CM Interventions     Barriers to Discharge MD  Medical stability and Incontinence  Nursing      PT Home environment access/layout, Decreased  caregiver support, Insurance for SNF coverage, Medication compliance, Behavior    OT      SLP      SW       Team Discharge Planning: Destination: PT-Home ,OT- Home , SLP-Home Projected Follow-up: PT-Home health PT, OT-  Outpatient OT, SLP-Home Health SLP, 24 hour supervision/assistance Projected Equipment Needs: PT-Rolling walker with 5" wheels, Wheelchair (measurements),  Wheelchair cushion (measurements), To be determined, OT- To be determined, SLP-None recommended by SLP Equipment Details: PT- , OT-  Patient/family involved in discharge planning: PT- Patient,  OT-Patient, SLP-Patient  MD ELOS: 12-16d Medical Rehab Prognosis:  Good Assessment:  59 year old right-handed male history of brain tumor astrocytoma with left frontal craniotomy resection 20 years ago, seizure disorder maintained on Lamictal has been seizure-free x5 years as well as history of hypothyroidism.  Per chart review patient lives with elderly parents.  Reportedly independent prior to admission.  Presented 09/26/2020 left-sided weakness facial droop.  Cranial CT scan showed no acute intracranial abnormality or significant interval change.  Left frontal craniotomy with resection cavity noted.  CT angiogram of head and neck without large vessel occlusion or limiting proximal stenosis.  MRI of the brain showed a 1.9 x 1.2 cm acute infarct extending from the right corona radiata into the right basal ganglia.  Echocardiogram with ejection fraction of 60 to 65% no wall motion abnormalities.  Admission chemistries unremarkable except potassium 5.5 glucose 102.  Maintain on aspirin and Plavix for CVA prophylaxis.  X3 weeks and aspirin alone.  Tolerating regular diet.  Therapy evaluations completed and patient was admitted for a comprehensive rehab program.    Now requiring 24/7 Rehab RN,MD, as well as CIR level PT, OT and SLP.  Treatment team will focus on ADLs and mobility with goals set at [facility]  See Team Conference Notes for  weekly updates to the plan of care

## 2020-10-02 NOTE — Progress Notes (Addendum)
PHYSICAL MEDICINE & REHABILITATION PROGRESS NOTE   Subjective/Complaints: Did not recall BM after dulc supp last noc, tried having BM this am but without success Received melatonin for sleep but did not sleep well, took this at home but unsure of dose No pains or urinary freq interfering with sleep   ROS- neg CP, SOB, N/V/D. No dysuria  Objective:   No results found. Recent Labs    10/01/20 0359  WBC 11.6*  HGB 17.6*  HCT 51.0  PLT 305   Recent Labs    10/01/20 0359  NA 135  K 4.0  CL 103  CO2 22  GLUCOSE 98  BUN 14  CREATININE 1.05  CALCIUM 9.4    Intake/Output Summary (Last 24 hours) at 10/02/2020 0910 Last data filed at 10/02/2020 0700 Gross per 24 hour  Intake 600 ml  Output 700 ml  Net -100 ml        Physical Exam: Vital Signs Blood pressure 113/77, pulse 81, temperature 97.6 F (36.4 C), temperature source Oral, resp. rate 18, height 5\' 10"  (1.778 m), weight 84.4 kg, SpO2 93 %.  General: No acute distress Mood and affect are appropriate Heart: Regular rate and rhythm no rubs murmurs or extra sounds Lungs: Clear to auscultation, breathing unlabored, no rales or wheezes Abdomen: Positive bowel sounds, soft nontender to palpation, nondistended Extremities: No clubbing, cyanosis, or edema Skin: No evidence of breakdown, no evidence of rash  Neurologic: Cranial nerves II through XII intact, motor strength is 5/5 in RIght  deltoid, bicep, tricep, grip, hip flexor, knee extensors, ankle dorsiflexor and plantar flexor Brunnstrom 3 LUE Tone left pec and biceps Sensation to LT reported as equal BUE and BLE Musculoskeletal: Full range of motion in all 4 extremities. No joint swelling  Assessment/Plan: 1. Functional deficits which require 3+ hours per day of interdisciplinary therapy in a comprehensive inpatient rehab setting.  Physiatrist is providing close team supervision and 24 hour management of active medical problems listed  below.  Physiatrist and rehab team continue to assess barriers to discharge/monitor patient progress toward functional and medical goals  Care Tool:  Bathing    Body parts bathed by patient: Left arm, Chest, Abdomen, Right upper leg, Left upper leg, Right lower leg, Left lower leg, Face   Body parts bathed by helper: Right arm, Front perineal area, Buttocks     Bathing assist Assist Level: Moderate Assistance - Patient 50 - 74%     Upper Body Dressing/Undressing Upper body dressing   What is the patient wearing?: Pull over shirt    Upper body assist Assist Level: Moderate Assistance - Patient 50 - 74%    Lower Body Dressing/Undressing Lower body dressing      What is the patient wearing?: Pants, Incontinence brief     Lower body assist Assist for lower body dressing: Maximal Assistance - Patient 25 - 49%     Toileting Toileting    Toileting assist Assist for toileting: 2 Helpers     Transfers Chair/bed transfer  Transfers assist     Chair/bed transfer assist level: Maximal Assistance - Patient 25 - 49% (stand pivot)     Locomotion Ambulation   Ambulation assist      Assist level: Maximal Assistance - Patient 25 - 49% Assistive device: No Device Max distance: 25   Walk 10 feet activity   Assist     Assist level: Maximal Assistance - Patient 25 - 49% Assistive device: No Device   Walk 50 feet activity  Assist Walk 50 feet with 2 turns activity did not occur: Safety/medical concerns         Walk 150 feet activity   Assist Walk 150 feet activity did not occur: Safety/medical concerns         Walk 10 feet on uneven surface  activity   Assist Walk 10 feet on uneven surfaces activity did not occur: Safety/medical concerns         Wheelchair     Assist   Type of Wheelchair: Manual    Wheelchair assist level: Moderate Assistance - Patient 50 - 74% Max wheelchair distance: 100    Wheelchair 50 feet with 2 turns  activity    Assist        Assist Level: Moderate Assistance - Patient 50 - 74%   Wheelchair 150 feet activity     Assist      Assist Level: Maximal Assistance - Patient 25 - 49%   Blood pressure 113/77, pulse 81, temperature 97.6 F (36.4 C), temperature source Oral, resp. rate 18, height 5\' 10"  (1.778 m), weight 84.4 kg, SpO2 93 %.    Medical Problem List and Plan: 1.  Left-sided weakness and facial droop secondary to acute infarct right basal ganglia/corona radiata on 09/26/2020 as well as history of astrocytoma with left frontal craniotomy resection 20 years ago             -patient may shower             -ELOS/Goals: modI 12-16 days CIR PT, OT, SLP 2.  Antithrombotics: -DVT/anticoagulation: SCDs             -antiplatelet therapy: Aspirin 81 mg daily and Plavix 75 mg daily x3 weeks then aspirin alone 3. Pain Management: Tylenol as needed. Denies pain 4. Mood: Lexapro 10 mg daily.  Provide emotional support             -antipsychotic agents: N/A 5. Neuropsych: This patient is capable of making decisions on his own behalf. 6. Skin/Wound Care: Routine skin checks 7. Fluids/Electrolytes/Nutrition: intake 660ml yesterday  , meal intake 70-75% Electrolytes stable on 11/13 8.  Seizure disorder.  Continue Lamictal 225 mg daily and 300 mg nightly, no seizure since rehab admission 9.  Hyperlipidemia.  LDL 131 on 11/13. Continue Lipitor 10.  Hypothyroidism.  Synthroid 11.  Leukocytosis 11.6 afebrile  12.  Occ bladder incont but usually cont- denied problems at home PTA 13.  Bowel incont 50% (1/2), was after dulc supp yesterday , tried having BM this am but was unsuccessful increase colace to 200mg  BID LOS: 2 days A FACE TO FACE EVALUATION WAS PERFORMED  Howard White 10/02/2020, 9:10 AM

## 2020-10-02 NOTE — Plan of Care (Signed)
  Problem: RH Balance Goal: LTG Patient will maintain dynamic sitting balance (PT) Description: LTG:  Patient will maintain dynamic sitting balance with assistance during mobility activities (PT) Flowsheets (Taken 10/02/2020 0744) LTG: Pt will maintain dynamic sitting balance during mobility activities with:: Supervision/Verbal cueing Goal: LTG Patient will maintain dynamic standing balance (PT) Description: LTG:  Patient will maintain dynamic standing balance with assistance during mobility activities (PT) Flowsheets (Taken 10/02/2020 0744) LTG: Pt will maintain dynamic standing balance during mobility activities with:: Minimal Assistance - Patient > 75%   Problem: RH Bed Mobility Goal: LTG Patient will perform bed mobility with assist (PT) Description: LTG: Patient will perform bed mobility with assistance, with/without cues (PT). Flowsheets (Taken 10/02/2020 0744) LTG: Pt will perform bed mobility with assistance level of: Supervision/Verbal cueing   Problem: RH Bed to Chair Transfers Goal: LTG Patient will perform bed/chair transfers w/assist (PT) Description: LTG: Patient will perform bed to chair transfers with assistance (PT). Flowsheets (Taken 10/02/2020 0744) LTG: Pt will perform Bed to Chair Transfers with assistance level: Contact Guard/Touching assist   Problem: RH Car Transfers Goal: LTG Patient will perform car transfers with assist (PT) Description: LTG: Patient will perform car transfers with assistance (PT). Flowsheets (Taken 10/02/2020 0744) LTG: Pt will perform car transfers with assist:: Minimal Assistance - Patient > 75%   Problem: RH Ambulation Goal: LTG Patient will ambulate in controlled environment (PT) Description: LTG: Patient will ambulate in a controlled environment, # of feet with assistance (PT). Flowsheets (Taken 10/02/2020 0744) LTG: Pt will ambulate in controlled environ  assist needed:: Minimal Assistance - Patient > 75% LTG: Ambulation distance in  controlled environment: 22ft with LRAD Goal: LTG Patient will ambulate in home environment (PT) Description: LTG: Patient will ambulate in home environment, # of feet with assistance (PT). Flowsheets (Taken 10/02/2020 0744) LTG: Pt will ambulate in home environ  assist needed:: Minimal Assistance - Patient > 75% LTG: Ambulation distance in home environment: 46ft with min assist   Problem: RH Wheelchair Mobility Goal: LTG Patient will propel w/c in controlled environment (PT) Description: LTG: Patient will propel wheelchair in controlled environment, # of feet with assist (PT) Flowsheets (Taken 10/02/2020 0744) LTG: Pt will propel w/c in controlled environ  assist needed:: Supervision/Verbal cueing LTG: Propel w/c distance in controlled environment: 126ft Goal: LTG Patient will propel w/c in home environment (PT) Description: LTG: Patient will propel wheelchair in home environment, # of feet with assistance (PT). Flowsheets (Taken 10/02/2020 0744) LTG: Pt will propel w/c in home environ  assist needed:: Supervision/Verbal cueing Distance: wheelchair distance in controlled environment: 150   Problem: RH Stairs Goal: LTG Patient will ambulate up and down stairs w/assist (PT) Description: LTG: Patient will ambulate up and down # of stairs with assistance (PT) Flowsheets (Taken 10/02/2020 0744) LTG: Pt will ambulate up/down stairs assist needed:: Minimal Assistance - Patient > 75% LTG: Pt will  ambulate up and down number of stairs: 12 with 1 rail

## 2020-10-02 NOTE — Progress Notes (Addendum)
Occupational Therapy Session Note  Patient Details  Name: Howard White MRN: [id number] Date of Birth: 1961-02-04  Today's Date: 10/02/2020 OT Individual Time: 1010-1105 OT Individual Time Calculation (min): 55 min    Short Term Goals: Week 1:  OT Short Term Goal 1 (Week 1): Pt will complete UB dressing with min assist for two consecutive sessions. OT Short Term Goal 2 (Week 1): Pt will complete LB dressing with mod assist sit to stand for two consecutive sessions. OT Short Term Goal 3 (Week 1): Pt will complete LB bathing with min assist sit to stand for two consecutive sessions. OT Short Term Goal 4 (Week 1): Pt will complete self AAROM exercises for the LUE with supervision following handout.  Skilled Therapeutic Interventions/Progress Updates:    Pt worked on bathing and dressing during session.  He needed mod assist for transfer from supine to sit EOB with increased extensor pattern noted in the LLE and trunk and flexor pattern in the left elbow.  Therapist provided total assist for donning his gripper socks secondary to time limitations, so that he could walk in the shower.  He was able to ambulate with max assist overall without an assistive device.  Increased flexed posture in his neck, trunk, and knees, with mod assist needed at times to advance the LLE.  Once on he shower seat, he was able to remove his clothing with overall mod assist.  He completed shower sit to stand with overall max instructional cueing for sequencing top to bottom approach instead of washing randomly.  He was given max hand over hand assist for integration of the LUE for washing the right arm and drying it.  Mod assist was needed for standing to wash his buttocks with therapist rinsing him off while he concentrated on standing.  He completed stand pivot transfer to the wheelchair at [facility] assist.  Worked on dressing sit to stand at the sink next.  Max instructional cueing for hemi dressing techniques to start with the  LUE and LLE when donning clothing.  He needed overall max assist for donning brief and pants, with pt completing impulsive movements when attempting to place his RLE in the pants leg,  He demonstrated LOB forward requiring min to mod assist to regain before standing to pull the garments over his hips with max assist.  He needed max demonstrational cueing and max assist for donning the pullover shirt secondary to motor planning deficits.  Therapist assisted with donning TEDs and shoes secondary to increased time.  Pt with increased impulsivity throughout session with sit to stand.  He needed max instructional cueing throughout session for hand placement and to not grab onto therapist to pull up or grab onto the sink.  Decreased carryover of instruction.   When dropping his glasses on the floor, he immediately reached down for them, bumping his head on the sink secondary to his impulsivity.  No noted abrasion or bruising seen and pt reporting that he had no pain from it.  Finished session with NT in room to assist pt with transfer to the bathroom as he stated he needed to have a BM.   Adendum:  After returning to pt's room for second session, noted 3 small skin tears on top of his head that could have been the result of scraping his head on the sink during the first session.  Pt was unable to recall if he had reached up and scratched his head or not.  Noted other older scratches on his  scalp as well.  Pt still with no complaints of pain or discomfort.  The three abrasions were surface area and not deep enough to cause bleeding.    Therapy Documentation Precautions:  Precautions Precautions: Fall Precaution Comments: [name], fluctuating tone in the LUE Restrictions Weight Bearing Restrictions: No   Pain: Pain Assessment Pain Scale: 0-10 Pain Score: 0-No pain ADL: See Care Tool Section for some details of mobility and selfcare  Therapy/Group: Individual Therapy  Meril Dray  OTR/L 10/02/2020, 12:25 PM

## 2020-10-02 NOTE — Progress Notes (Signed)
Occupational Therapy Session Note  Patient Details  Name: Howard White MRN: [id number] Date of Birth: 1961/07/06  Today's Date: 10/02/2020 OT Individual Time: 1324-4010 OT Individual Time Calculation (min): 31 min    Short Term Goals: Week 1:  OT Short Term Goal 1 (Week 1): Pt will complete UB dressing with min assist for two consecutive sessions. OT Short Term Goal 2 (Week 1): Pt will complete LB dressing with mod assist sit to stand for two consecutive sessions. OT Short Term Goal 3 (Week 1): Pt will complete LB bathing with min assist sit to stand for two consecutive sessions. OT Short Term Goal 4 (Week 1): Pt will complete self AAROM exercises for the LUE with supervision following handout.  Skilled Therapeutic Interventions/Progress Updates:    Pt in bed asleep to start session.  He was easily woken up and agreeable to therapy. Worked on LUE neuromuscular re-education from supine.  Began with AAROM shoulder flexion with emphasis on activation of the shoulder and reducing overactivity of the biceps as well as trunk and LEs during ROM.  He needed max facilitation to maintain elbow extension during shoulder flexion for 1 set of 10 repetitions.  Increased activity noted in the finger flexors with increased tone as well.  Transitioned to having him place his hand on a flat surface of a notebook and focus on maintaining elbow extension and hand contact with the notebook as therapist moved it into shoulder flexion and some shoulder external rotation with slight abduction.  He was able to keep his hand on the book 75% of the time with no more than min assist, however he did exhibit increased digit flexor tone the more his arm was abducted and externally rotated.  Finished session with pt in the bed and the left arm resting on a pillow.  Therapist did assist pt with use of the urinal during session as well with overall mod assist for rolling and clothing management.    Therapy  Documentation Precautions:  Precautions Precautions: Fall Precaution Comments: [name], fluctuating tone in the LUE Restrictions Weight Bearing Restrictions: No  Pain: Pain Assessment Pain Scale: Faces Pain Score: 0-No pain ADL: See Care Tool Section for some details of mobility and selfcare  Therapy/Group: Individual Therapy  Phi Avans OTR/L 10/02/2020, 4:44 PM

## 2020-10-02 NOTE — Progress Notes (Signed)
Orthopedic Tech Progress Note Patient Details:  Howard White 1960/12/05 [id number] Called order into [facility] Patient ID: [name], male   DOB: 10/31/1961, 59 y.o.   MRN: [id number]   [name] 10/02/2020, 8:11 PM

## 2020-10-02 NOTE — Plan of Care (Signed)
  Problem: Consults Goal: RH STROKE PATIENT EDUCATION Description: See Patient Education module for education specifics  Outcome: Progressing   Problem: RH SAFETY Goal: RH STG ADHERE TO SAFETY PRECAUTIONS W/ASSISTANCE/DEVICE Description: STG Adhere to Safety Precautions With cues and reminders. Outcome: Progressing   Problem: RH PAIN MANAGEMENT Goal: RH STG PAIN MANAGED AT OR BELOW PT'S PAIN GOAL Description: Pain level less than 4 on scale of 0-10 Outcome: Progressing   Problem: RH KNOWLEDGE DEFICIT Goal: RH STG INCREASE KNOWLEDGE OF HYPERTENSION Description: Pt will be able adhere to medication regimen, dietary and lifestyle modification to better control blood pressure and prevent stroke using handouts and booklets provided with mod I assist.  Outcome: Progressing Goal: RH STG INCREASE KNOWLEDGE OF STROKE PROPHYLAXIS Description: Pt will be able adhere to medication regimen, dietary and lifestyle modification to better control blood pressure and prevent stroke using handouts and booklets provided with mod I assist.  Outcome: Progressing

## 2020-10-02 NOTE — Progress Notes (Signed)
Speech Language Pathology Daily Session Note  Patient Details  Name: Howard White MRN: [id number] Date of Birth: 1961-01-22  Today's Date: 10/02/2020 SLP Individual Time: 1330-1411 SLP Individual Time Calculation (min): 41 min  Short Term Goals: Week 1: SLP Short Term Goal 1 (Week 1): Pt will use compensatory strategies for speech intelligibility to achieve 90% intelligibilty at the sentence level with Min A verbal and visual cues. SLP Short Term Goal 2 (Week 1): Pt will demonstrate ability to problem solve basic to mildly complex tasks with Mod A verbal/visual cues. SLP Short Term Goal 3 (Week 1): Pt will identify 2 cognitive impairments with Mod A verbal/visual cues. SLP Short Term Goal 4 (Week 1): Pt will selectively attend to tasks with Min A verbal/visual cues.  Skilled Therapeutic Interventions: Pt was seen for skilled ST targeting cognitive goals. Upon arrival, pt was finishing with use of urinal and brief change with RN and NT; he required Moderate verbal cues for redirection in order to sustain attention to the task, as well as working memory and sequencing. He was somewhat perseverative on having urinal at bedside, however, per nursing, they would like pt to call for assistance with urinal use due to pt being unable to successfully use it independently. External aid posted in room to assist with recall of this information. After clean and dry, SLP facilitated session with basic money and medication management tasks, during which pt required Min A verbal and visual cues for problem solving and Mod A for error awareness, in addition to written aids for working memory throughout task. Pt left laying in bed with alarm set and needs within reach. Continue per current plan of care.        Pain Pain Assessment Pain Score: 0-No pain  Therapy/Group: Individual Therapy  Arbutus Leas 10/02/2020, 3:03 PM

## 2020-10-03 ENCOUNTER — Inpatient Hospital Stay (HOSPITAL_COMMUNITY): Payer: PPO | Admitting: Physical Therapy

## 2020-10-03 ENCOUNTER — Inpatient Hospital Stay (HOSPITAL_COMMUNITY): Payer: PPO

## 2020-10-03 ENCOUNTER — Inpatient Hospital Stay (HOSPITAL_COMMUNITY): Payer: PPO | Admitting: Speech Pathology

## 2020-10-03 NOTE — Progress Notes (Signed)
PHYSICAL MEDICINE & REHABILITATION PROGRESS NOTE   Subjective/Complaints:  No issues overnight, pt using speech strategies to improve articulation   ROS- neg CP, SOB, N/V/D. No dysuria  Objective:   No results found. Recent Labs    10/01/20 0359  WBC 11.6*  HGB 17.6*  HCT 51.0  PLT 305   Recent Labs    10/01/20 0359  NA 135  K 4.0  CL 103  CO2 22  GLUCOSE 98  BUN 14  CREATININE 1.05  CALCIUM 9.4    Intake/Output Summary (Last 24 hours) at 10/03/2020 0829 Last data filed at 10/03/2020 0658 Gross per 24 hour  Intake 450 ml  Output 809 ml  Net -359 ml        Physical Exam: Vital Signs Blood pressure 117/77, pulse 65, temperature 98.2 F (36.8 C), temperature source Oral, resp. rate 18, height 5\' 10"  (1.778 m), weight 84.4 kg, SpO2 96 %.  General: No acute distress Mood and affect are appropriate Heart: Regular rate and rhythm no rubs murmurs or extra sounds Lungs: Clear to auscultation, breathing unlabored, no rales or wheezes Abdomen: Positive bowel sounds, soft nontender to palpation, nondistended Extremities: No clubbing, cyanosis, or edema Skin: No evidence of breakdown, no evidence of rash  Neurologic: Cranial nerves II through XII intact, motor strength is 5/5 in RIght  deltoid, bicep, tricep, grip, hip flexor, knee extensors, ankle dorsiflexor and plantar flexor Brunnstrom 3 LUE Tone left pec and biceps Sensation to LT reported as equal BUE and BLE Musculoskeletal: Full range of motion in all 4 extremities. No joint swelling  Assessment/Plan: 1. Functional deficits which require 3+ hours per day of interdisciplinary therapy in a comprehensive inpatient rehab setting.  Physiatrist is providing close team supervision and 24 hour management of active medical problems listed below.  Physiatrist and rehab team continue to assess barriers to discharge/monitor patient progress toward functional and medical goals  Care Tool:  Bathing     Body parts bathed by patient: Left arm, Chest, Abdomen, Right upper leg, Left upper leg, Right lower leg, Left lower leg, Face, Front perineal area   Body parts bathed by helper: Right arm, Buttocks     Bathing assist Assist Level: Moderate Assistance - Patient 50 - 74%     Upper Body Dressing/Undressing Upper body dressing   What is the patient wearing?: Pull over shirt    Upper body assist Assist Level: Maximal Assistance - Patient 25 - 49%    Lower Body Dressing/Undressing Lower body dressing      What is the patient wearing?: Incontinence brief     Lower body assist Assist for lower body dressing: Total Assistance - Patient < 25%     Toileting Toileting    Toileting assist Assist for toileting: Total Assistance - Patient < 25%     Transfers Chair/bed transfer  Transfers assist     Chair/bed transfer assist level: 2 Helpers     Locomotion Ambulation   Ambulation assist      Assist level: Maximal Assistance - Patient 25 - 49% Assistive device: No Device Max distance: 12'   Walk 10 feet activity   Assist     Assist level: 2 helpers (+2 min A) Assistive device: Hand held assist   Walk 50 feet activity   Assist Walk 50 feet with 2 turns activity did not occur: Safety/medical concerns  Assist level: 2 helpers (+2 min A) Assistive device: Hand held assist    Walk 150 feet activity  Assist Walk 150 feet activity did not occur: Safety/medical concerns         Walk 10 feet on uneven surface  activity   Assist Walk 10 feet on uneven surfaces activity did not occur: Safety/medical concerns         Wheelchair     Assist   Type of Wheelchair: Manual    Wheelchair assist level: Moderate Assistance - Patient 50 - 74% Max wheelchair distance: 100    Wheelchair 50 feet with 2 turns activity    Assist        Assist Level: Moderate Assistance - Patient 50 - 74%   Wheelchair 150 feet activity     Assist       Assist Level: Maximal Assistance - Patient 25 - 49%   Blood pressure 117/77, pulse 65, temperature 98.2 F (36.8 C), temperature source Oral, resp. rate 18, height 5\' 10"  (1.778 m), weight 84.4 kg, SpO2 96 %.    Medical Problem List and Plan: 1.  Left-sided weakness and facial droop secondary to acute infarct right basal ganglia/corona radiata on 09/26/2020 as well as history of astrocytoma with left frontal craniotomy resection 20 years ago             -patient may shower             -ELOS/Goals: modI 12-16 days CIR PT, OT, SLP 2.  Antithrombotics: -DVT/anticoagulation: SCDs             -antiplatelet therapy: Aspirin 81 mg daily and Plavix 75 mg daily x3 weeks then aspirin alone 3. Pain Management: Tylenol as needed. Denies pain 4. Mood: Lexapro 10 mg daily.  Provide emotional support             -antipsychotic agents: N/A 5. Neuropsych: This patient is capable of making decisions on his own behalf. 6. Skin/Wound Care: Routine skin checks 7. Fluids/Electrolytes/Nutrition: intake 63ml yesterday  , meal intake 70-75% Electrolytes stable on 11/13 8.  Seizure disorder.  Continue Lamictal 225 mg daily and 300 mg nightly, no seizure since rehab admission 9.  Hyperlipidemia.  LDL 131 on 11/13. Continue Lipitor 10.  Hypothyroidism.  Synthroid 11.  Leukocytosis 11.6 afebrile  12.  Occ bladder incont but usually cont- denied problems at home PTA 13.  Bowel incont ~50% colace to 200mg  BID, pt feels bowels are moving better  14.  Sleep is fair cont melatonin 5mg  LOS: 3 days A FACE TO FACE EVALUATION WAS PERFORMED  Charlett Blake 10/03/2020, 8:29 AM

## 2020-10-03 NOTE — Progress Notes (Signed)
Occupational Therapy Session Note  Patient Details  Name: Howard White MRN: [id number] Date of Birth: [birth year]  Today's Date: 10/03/2020 OT Individual Time: 1914-7829 OT Individual Time Calculation (min): 28 min    Short Term Goals: Week 1:  OT Short Term Goal 1 (Week 1): Pt will complete UB dressing with min assist for two consecutive sessions. OT Short Term Goal 2 (Week 1): Pt will complete LB dressing with mod assist sit to stand for two consecutive sessions. OT Short Term Goal 3 (Week 1): Pt will complete LB bathing with min assist sit to stand for two consecutive sessions. OT Short Term Goal 4 (Week 1): Pt will complete self AAROM exercises for the LUE with supervision following handout.  Skilled Therapeutic Interventions/Progress Updates:    1:1. Pt received in bed agreeable to OT with no pain. Pt transfers into/OOB into w/c with stand pivot an MOD to R with VC for hand placement and waiting till OT is ready after OT dons shoes total A for time management. OT provides PROM of shouder/wrist/digits in prep for movement as well as K taping shoulder to help with deltoid actvation and rhomboid stabilization to minimize winging scapula. Pt copletes forward reahing with tactile cues at scapula and elbow for full ROM. Exited session with pt seated in bed, exit alarm on and call light in reach   Therapy Documentation Precautions:  Precautions Precautions: Fall Precaution Comments: [name], fluctuating tone in the LUE Restrictions Weight Bearing Restrictions: No General:   Vital Signs:   Pain: Pain Assessment Pain Scale: 0-10 Pain Score: 0-No pain ADL: ADL Eating: Set up Where Assessed-Eating: Wheelchair Grooming: Supervision/safety Where Assessed-Grooming: Wheelchair Upper Body Bathing: Minimal assistance Where Assessed-Upper Body Bathing: Edge of bed Lower Body Bathing: Moderate assistance Where Assessed-Lower Body Bathing: Edge of bed Upper Body Dressing:  Moderate assistance Where Assessed-Upper Body Dressing: Edge of bed Lower Body Dressing: Maximal assistance Where Assessed-Lower Body Dressing: Edge of bed Toileting: Maximal assistance Where Assessed-Toileting: Bedside Commode Toilet Transfer: Maximal assistance Toilet Transfer Method: Stand pivot Science writer: Radiographer, therapeutic: Not assessed Social research officer, government: Not assessed Vision   Perception    Praxis   Exercises:   Other Treatments:     Therapy/Group: Individual Therapy  Tonny Branch 10/03/2020, 1:30 PM

## 2020-10-03 NOTE — Progress Notes (Signed)
Speech Language Pathology Daily Session Note  Patient Details  Name: Howard White MRN: [id number] Date of Birth: Apr 13, 1961  Today's Date: 10/03/2020 SLP Individual Time: 9702-6378 SLP Individual Time Calculation (min): 55 min  Short Term Goals: Week 1: SLP Short Term Goal 1 (Week 1): Pt will use compensatory strategies for speech intelligibility to achieve 90% intelligibilty at the sentence level with Min A verbal and visual cues. SLP Short Term Goal 2 (Week 1): Pt will demonstrate ability to problem solve basic to mildly complex tasks with Mod A verbal/visual cues. SLP Short Term Goal 3 (Week 1): Pt will identify 2 cognitive impairments with Mod A verbal/visual cues. SLP Short Term Goal 4 (Week 1): Pt will selectively attend to tasks with Min A verbal/visual cues.  Skilled Therapeutic Interventions: Pt was seen for skilled ST targeting cognitive-linguistic goals. SLP facilitated session with initiation of a memory notebook as compensatory strategy to assist with pt's recall and carryover of new day to day information and therapy techniques, as well as names of his medical team. Pt later required Moderate assistance to recall information to record in notebook and use it to recall therapy team names. SLP also introduced speech intelligibility strategies and external aid posted in room for recall. He used said strategies with only Supervision A verbal cueing throughout session and was 95% intelligible. Most cueing only required for more overarticulation of multisyllabic words. Picture description task also used to target primarily attention but also problem solving. He only required Min A verbal cues for problem solving strategy to identify 3 differenced between photographs. Mod faded to Min A verbal cues required for redirection to tasks; his attention was held more successfully during structured tasks in comparison to those that were less structured. Pt left laying in bed with alarm set and needs  within reach. Continue per current plan of care.        Pain Pain Assessment Pain Scale: 0-10 Pain Score: 0-No pain  Therapy/Group: Individual Therapy  Arbutus Leas 10/03/2020, 11:53 AM

## 2020-10-03 NOTE — Progress Notes (Signed)
Occupational Therapy Session Note  Patient Details  Name: Howard White MRN: [id number] Date of Birth: 1961-09-01  Today's Date: 10/03/2020 OT Individual Time: 9977-4142 OT Individual Time Calculation (min): 35 min    Short Term Goals: Week 1:  OT Short Term Goal 1 (Week 1): Pt will complete UB dressing with min assist for two consecutive sessions. OT Short Term Goal 2 (Week 1): Pt will complete LB dressing with mod assist sit to stand for two consecutive sessions. OT Short Term Goal 3 (Week 1): Pt will complete LB bathing with min assist sit to stand for two consecutive sessions. OT Short Term Goal 4 (Week 1): Pt will complete self AAROM exercises for the LUE with supervision following handout.  Skilled Therapeutic Interventions/Progress Updates:    1:1. Pt received I bed agreeable to early OT session. Pt reporting need to change brief and Pt requires MIN A for supine>sitting d/t poor motor planning and pt just pulling repeatedly on bed rail to help sit EOB. Pt doffs shirt with S and VC for hemi sequence and tactile cues for upright posture. Pt dons shirt with MOD A overall seated EOB with pt able to verbalize threading LUE first. t transfers via squat pivot to w/c and grooms at sink with HOH A to incorporate LUE into bimanual tasks. Pt dons pants with MOD A Overall threading LLE first with MIN A and pt able to maintain static standing balance while OT advances pants past hips. Pt doffes B sock with A to maintain LLE in figure 4 and don shoes with A only to fasten. MAX A stand pivot and sit to stand on toilet to manage pants for pt to have BM seted with S on toilet. Stedy transfer back to toilet and for hygiene/CM post void. Exited session with pt seated in w/c, exit alarm on and call light in reach. RN in room administering meds   Therapy Documentation Precautions:  Precautions Precautions: Fall Precaution Comments: [name], fluctuating tone in the LUE Restrictions Weight Bearing  Restrictions: No General:   Vital Signs: Therapy Vitals Temp: 98.2 F (36.8 C) Temp Source: Oral Pulse Rate: 65 Resp: 18 BP: 117/77 Oxygen Therapy SpO2: 96 % O2 Device: Room Air Pain:   ADL: ADL Eating: Set up Where Assessed-Eating: Wheelchair Grooming: Supervision/safety Where Assessed-Grooming: Wheelchair Upper Body Bathing: Minimal assistance Where Assessed-Upper Body Bathing: Edge of bed Lower Body Bathing: Moderate assistance Where Assessed-Lower Body Bathing: Edge of bed Upper Body Dressing: Moderate assistance Where Assessed-Upper Body Dressing: Edge of bed Lower Body Dressing: Maximal assistance Where Assessed-Lower Body Dressing: Edge of bed Toileting: Maximal assistance Where Assessed-Toileting: Bedside Commode Toilet Transfer: Maximal assistance Toilet Transfer Method: Stand pivot Science writer: Radiographer, therapeutic: Not assessed Social research officer, government: Not assessed Vision   Perception    Praxis   Exercises:   Other Treatments:     Therapy/Group: Individual Therapy  Tonny Branch 10/03/2020, 9:48 AM

## 2020-10-03 NOTE — Progress Notes (Signed)
Therapy documents dropping his glass on the floor he immediately reached down for them bumping his head on the sink secondary to impulsivity no injury sustained however he did have a small abrasion to his scalp nonbleeding.  Patient was examined again stressed the need for safety discussed with nursing.

## 2020-10-03 NOTE — Progress Notes (Signed)
Physical Therapy Session Note  Patient Details  Name: Howard White MRN: [id number] Date of Birth: 27-Jan-1961  Today's Date: 10/03/2020 PT Individual Time: 1007-1103 PT Individual Time Calculation (min): 56 min   Short Term Goals: Week 1:  PT Short Term Goal 1 (Week 1): Pt will transfer to and from [facility] with mod assist consistently PT Short Term Goal 2 (Week 1): Pt will propell WC 19ft with min assist PT Short Term Goal 3 (Week 1): Pt will ambulater 4ft with mod assist and LRAD  Skilled Therapeutic Interventions/Progress Updates:    Pt received sitting in w/c and eager to participate in therapy session. Transported to/from gym in w/c for time management and energy conservation. L squat pivot w/c>EOM with mod assist for pivoting hips and balance with max cuing for slow/controlled movement. Pt appears to be much more impulsive today with decreased focused, sustained, and divided attention requiring increased cuing throughout session for decreasing speed of movements to ensure safety. Gait training ~41ft x2 with +2 HHA and +2 mod assist for balance - continues to demo R posterior upper trunk rotation along with decreased R weight shift during stance phase resulting in decreased L LE foot clearance and step length along with progressive cervical/trunk flexion requiring repeated cuing to correct throughout. In // bars with B UE support performed L LE NMR targeting swing phase via foot taps on/off yoga block - emphasis on R weight shift prior to initiating L swing - min/mod assist for balance due to pt continuing to demonstrate poor safety awareness and initiating L stepping while having L LOB and increased cervical/trunk flexion. Sit<>stands to/from EOM, no AD, with min/mod assist for balance and cuing for midline orientation during transition - mirror feedback. Throughout session cued pt for attention to L UE positioning to promote weight bearing. Dynamic standing balance task using R UE to place  clothespins around the mirror - pt continues to demo anterior weight shift with lack of awareness to shift weight back posteriorly to achieve midline requiring min/mod assist for balance. Pt reports fatigue. R squat pivot EOM>w/c with mod assist for pivoting hips and balance - continued cuing for head/hips relationship. Transported back to room and pt requesting to return to bed. R squat pivot as above. Doffed shoes max assist for time management. Sit>supine with min assist for trunk control and cuing for increased L LE hip/knee flexion due to onset of extensor tone. Pt left supine in bed with needs in reach and bed alarm on.  Therapy Documentation Precautions:  Precautions Precautions: Fall Precaution Comments: [name], fluctuating tone in the LUE Restrictions Weight Bearing Restrictions: No  Pain:   No reports of pain throughout session.   Therapy/Group: Individual Therapy  Tawana Scale , PT, DPT, CSRS  10/03/2020, 7:59 AM

## 2020-10-04 ENCOUNTER — Inpatient Hospital Stay (HOSPITAL_COMMUNITY): Payer: PPO | Admitting: Speech Pathology

## 2020-10-04 ENCOUNTER — Inpatient Hospital Stay (HOSPITAL_COMMUNITY): Payer: PPO

## 2020-10-04 NOTE — Plan of Care (Signed)
  Problem: Consults Goal: RH STROKE PATIENT EDUCATION Description: See Patient Education module for education specifics  Outcome: Progressing   Problem: RH SAFETY Goal: RH STG ADHERE TO SAFETY PRECAUTIONS W/ASSISTANCE/DEVICE Description: STG Adhere to Safety Precautions With cues and reminders. Outcome: Progressing   Problem: RH PAIN MANAGEMENT Goal: RH STG PAIN MANAGED AT OR BELOW PT'S PAIN GOAL Description: Pain level less than 4 on scale of 0-10 Outcome: Progressing   Problem: RH KNOWLEDGE DEFICIT Goal: RH STG INCREASE KNOWLEDGE OF HYPERTENSION Description: Pt will be able adhere to medication regimen, dietary and lifestyle modification to better control blood pressure and prevent stroke using handouts and booklets provided with mod I assist.  Outcome: Progressing Goal: RH STG INCREASE KNOWLEDGE OF STROKE PROPHYLAXIS Description: Pt will be able adhere to medication regimen, dietary and lifestyle modification to better control blood pressure and prevent stroke using handouts and booklets provided with mod I assist.  Outcome: Progressing

## 2020-10-04 NOTE — Progress Notes (Signed)
[facility] PHYSICAL MEDICINE & REHABILITATION PROGRESS NOTE   Subjective/Complaints:  Patient feels down today no specific complaints except that he feels cold  ROS- neg CP, SOB, N/V/D. No dysuria  Objective:   No results found. No results for input(s): WBC, HGB, HCT, PLT in the last 72 hours. No results for input(s): NA, K, CL, CO2, GLUCOSE, BUN, CREATININE, CALCIUM in the last 72 hours.  Intake/Output Summary (Last 24 hours) at 10/04/2020 1420 Last data filed at 10/04/2020 1230 Gross per 24 hour  Intake 522 ml  Output --  Net 522 ml        Physical Exam: Vital Signs Blood pressure 116/81, pulse 61, temperature 97.8 F (36.6 C), resp. rate 14, height 5\' 10"  (1.778 m), weight 84.4 kg, SpO2 92 %.   General: No acute distress Mood and affect are appropriate Heart: Regular rate and rhythm no rubs murmurs or extra sounds Lungs: Clear to auscultation, breathing unlabored, no rales or wheezes Abdomen: Positive bowel sounds, soft nontender to palpation, nondistended Extremities: No clubbing, cyanosis, or edema Skin: No evidence of breakdown, no evidence of rash  Neurologic: Cranial nerves II through XII intact, motor strength is 5/5 in RIght  deltoid, bicep, tricep, grip, hip flexor, knee extensors, ankle dorsiflexor and plantar flexor Brunnstrom 3 LUE Tone left pec and biceps Sensation to LT reported as equal BUE and BLE Musculoskeletal: Full range of motion in all 4 extremities. No joint swelling  Assessment/Plan: 1. Functional deficits which require 3+ hours per day of interdisciplinary therapy in a comprehensive inpatient rehab setting.  Physiatrist is providing close team supervision and 24 hour management of active medical problems listed below.  Physiatrist and rehab team continue to assess barriers to discharge/monitor patient progress toward functional and medical goals  Care Tool:  Bathing    Body parts bathed by patient: Left arm, Chest, Abdomen, Right  upper leg, Left upper leg, Right lower leg, Left lower leg, Face, Front perineal area   Body parts bathed by helper: Right arm, Buttocks     Bathing assist Assist Level: Moderate Assistance - Patient 50 - 74%     Upper Body Dressing/Undressing Upper body dressing   What is the patient wearing?: Hospital gown only    Upper body assist Assist Level: Total Assistance - Patient < 25%    Lower Body Dressing/Undressing Lower body dressing      What is the patient wearing?: Incontinence brief     Lower body assist Assist for lower body dressing: Dependent - Patient 0%     Toileting Toileting    Toileting assist Assist for toileting: Maximal Assistance - Patient 25 - 49%     Transfers Chair/bed transfer  Transfers assist     Chair/bed transfer assist level: Moderate Assistance - Patient 50 - 74%     Locomotion Ambulation   Ambulation assist      Assist level: 2 helpers (+2 mod A) Assistive device: Hand held assist Max distance: 66ft   Walk 10 feet activity   Assist     Assist level: 2 helpers (+2 mod A) Assistive device: Hand held assist   Walk 50 feet activity   Assist Walk 50 feet with 2 turns activity did not occur: Safety/medical concerns  Assist level: 2 helpers (+2 mod A) Assistive device: Hand held assist    Walk 150 feet activity   Assist Walk 150 feet activity did not occur: Safety/medical concerns         Walk 10 feet on uneven surface  activity   Assist Walk 10 feet on uneven surfaces activity did not occur: Safety/medical concerns         Wheelchair     Assist Will patient use wheelchair at discharge?: Yes Type of Wheelchair: Manual    Wheelchair assist level: Moderate Assistance - Patient 50 - 74% Max wheelchair distance: 100    Wheelchair 50 feet with 2 turns activity    Assist        Assist Level: Moderate Assistance - Patient 50 - 74%   Wheelchair 150 feet activity     Assist      Assist  Level: Maximal Assistance - Patient 25 - 49%   Blood pressure 116/81, pulse 61, temperature 97.8 F (36.6 C), resp. rate 14, height 5\' 10"  (1.778 m), weight 84.4 kg, SpO2 92 %.    Medical Problem List and Plan: 1.  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  Antithrombotics: -DVT/anticoagulation: SCDs             -antiplatelet therapy: Aspirin 81 mg daily and Plavix 75 mg daily x3 weeks then aspirin alone 3. Pain Management: Tylenol as needed. Denies pain 4. Mood: Lexapro 10 mg daily.  Provide emotional support             -antipsychotic agents: N/A 5. Neuropsych: This patient is capable of making decisions on his own behalf. 6. Skin/Wound Care: Routine skin checks 7. Fluids/Electrolytes/Nutrition: intake 625ml yesterday  , meal intake 70-75% Electrolytes stable on 11/13 8.  Seizure disorder.  Continue Lamictal 225 mg daily and 300 mg nightly, no seizure since rehab admission 9.  Hyperlipidemia.  LDL 131 on 11/13. Continue Lipitor 10.  Hypothyroidism.  Synthroid 11.  Leukocytosis 11.6 afebrile  12.  Occ bladder incont but usually cont- denied problems at home PTA 13.  Bowel incont ~50% colace to 200mg  BID, pt feels bowels are moving better  14.  Sleep is fair cont melatonin 5mg  LOS: 4 days A FACE TO FACE EVALUATION WAS PERFORMED  Charlett Blake 10/04/2020, 2:20 PM

## 2020-10-04 NOTE — Progress Notes (Signed)
Speech Language Pathology Daily Session Note  Patient Details  Name: Howard White MRN: [id number] Date of Birth: 11-20-1960  Today's Date: 10/04/2020 SLP Individual Time: 9381-0175 SLP Individual Time Calculation (min): 45 min  Short Term Goals: Week 1: SLP Short Term Goal 1 (Week 1): Pt will use compensatory strategies for speech intelligibility to achieve 90% intelligibilty at the sentence level with Min A verbal and visual cues. SLP Short Term Goal 2 (Week 1): Pt will demonstrate ability to problem solve basic to mildly complex tasks with Mod A verbal/visual cues. SLP Short Term Goal 3 (Week 1): Pt will identify 2 cognitive impairments with Mod A verbal/visual cues. SLP Short Term Goal 4 (Week 1): Pt will selectively attend to tasks with Min A verbal/visual cues.  Skilled Therapeutic Interventions:   Patient seen to address cognitive-linguistic goals and review of speech strategies. Patient immediately pointed to sign (slow, loud, pause, over articulate) and demonstrated adequate use of this strategy in connected speech. He was actually over-compensating with his over-articulating and slow speech production, however SLP did not want to disrupt his progress. Patient completed oral reading of passage "[employer]" speech passage and was able to utilize speech strategies but was not able to modulate his vocal intensity or intonation and would at times seem to 'force' words out. Patient able to recall and describe recent therapeutic activities and recalled memory book which was started yesterday, however he had spilled water on it recently. Patient observed to repeat himself during unstructured conversation and was verbose, yet able to be redirected without significant difficulty. Patient continues to benefit from skilled SLP intervention to maximize speech, language and cognitive function prior to discharge.   Pain Pain Assessment Pain Scale: 0-10 Pain Score: 0-No pain  Therapy/Group:  Individual Therapy  Sonia Baller, MA, CCC-SLP Speech Therapy

## 2020-10-04 NOTE — Progress Notes (Signed)
Occupational Therapy Session Note  Patient Details  Name: Howard White MRN: [id number] Date of Birth: Mar 15, 1961  Today's Date: 10/04/2020 OT Individual Time: 1330-1441 OT Individual Time Calculation (min): 71 min    Short Term Goals: Week 1:  OT Short Term Goal 1 (Week 1): Pt will complete UB dressing with min assist for two consecutive sessions. OT Short Term Goal 2 (Week 1): Pt will complete LB dressing with mod assist sit to stand for two consecutive sessions. OT Short Term Goal 3 (Week 1): Pt will complete LB bathing with min assist sit to stand for two consecutive sessions. OT Short Term Goal 4 (Week 1): Pt will complete self AAROM exercises for the LUE with supervision following handout.  Skilled Therapeutic Interventions/Progress Updates:    1:1. Pt received in w/c agreeable to OT. Pt reporting wanting to shave but no electric razor present. Ot haves Pt with regular razor for safety after pt washes face and applies shaving cream to B sides of face. Pt able to direct care well of "shave up and down." Pt grooms at sink for oral care with [facility] A to incorporate LUE Into task. Pt impulsive throughotu sessions with sit to stands in stedy often standing prematurely with or without feet on stedy flap. Pt bathes at sit to stand level in shower with MAX A for sit to stand and maintaining balane while OT washes buttocks. HOH A with MAX A for inorporating bathing RUE with LUE, pt able to wash B feet, peri area and chest/stomah with no A. Pt dons shirt with MOD A, pants with MAX A sit to stand (MIN A power up at sink) and socks with MAX A d/t tightness. Ot provides gentle PROM while pt lies I supine on a towel roll to open up shoulders d/t increased curvature of thoracic and cervial spine. PROM focus on LUE against flexor synergies d/t hypertonicity in this distribution. Exited session with pt seated in bed, exit alarm on and call light in reach   Therapy Documentation Precautions:   Precautions Precautions: Fall Precaution Comments: [name], fluctuating tone in the LUE Restrictions Weight Bearing Restrictions: No General:   Vital Signs:   Pain:   ADL: ADL Eating: Set up Where Assessed-Eating: Wheelchair Grooming: Supervision/safety Where Assessed-Grooming: Wheelchair Upper Body Bathing: Minimal assistance Where Assessed-Upper Body Bathing: Edge of bed Lower Body Bathing: Moderate assistance Where Assessed-Lower Body Bathing: Edge of bed Upper Body Dressing: Moderate assistance Where Assessed-Upper Body Dressing: Edge of bed Lower Body Dressing: Maximal assistance Where Assessed-Lower Body Dressing: Edge of bed Toileting: Maximal assistance Where Assessed-Toileting: Bedside Commode Toilet Transfer: Maximal assistance Toilet Transfer Method: Stand pivot Science writer: Radiographer, therapeutic: Not assessed Social research officer, government: Not assessed Vision   Perception    Praxis   Exercises:   Other Treatments:     Therapy/Group: Individual Therapy  Tonny Branch 10/04/2020, 12:56 PM

## 2020-10-04 NOTE — Progress Notes (Signed)
Physical Therapy Session Note  Patient Details  Name: Howard White MRN: [id number] Date of Birth: 21-May-1961  Today's Date: 10/04/2020 PT Individual Time: 7782-4235 PT Individual Time Calculation (min): 75 min   Short Term Goals: Week 1:  PT Short Term Goal 1 (Week 1): Pt will transfer to and from [facility] with mod assist consistently PT Short Term Goal 2 (Week 1): Pt will propell WC 167ft with min assist PT Short Term Goal 3 (Week 1): Pt will ambulater 23ft with mod assist and LRAD  Skilled Therapeutic Interventions/Progress Updates:    Patient received supine in bed after speech therapy, agreeable to PT. He denies pain. Patient able to come to sitting edge of bed with MinA, but remains impulsive, attempting to sit between bedrail and foot of bed before PT could lower bed rail. Patient able to transfer to wc via stand pivot with ModA, but uses PT's body to stabilize despite verbal cues for proper hand placement on wc. PT propelling patient to therapy gym for time management and energy conservation. He transferred to therapy mat via Squat pivot with MinA. Mirror used for visual feedback as patient completed dynamic sitting balance task with emphasis on midline orientation. Patient able to reach outside base of support to retrieve peg to place into peg board. Sufficient weight shifts noted B, but he did require consistent cuing to return to midline, or attend to mirror to observe where he was sitting in relation to midline. With increased repetitions of this task, patient requiring fewer cues to complete task and return to midline. Patient completing seated scapular squeezes 2 x20 with emphasis on upright posture and equal activation of B rhomboids. Noted winging to L scapula and uncoordinated muscle activation. Patient able to complete sit <> stand with mirror for visual feedback on posture. MinA/ModA needed initially for sit <>stand progressing to CGA with increased repetitions. With fatigue, patient with  noted poor head control in standing, despite upright and midline posture. After extended rest break, patient able to progress to standing marches with MinA for postural control and consistent verbal cues for upright posture. Noted ataxic pattern to B LE, L >R, alternating between stepping into NBOS and WBOS. Patient with poor activity tolerance for this task requiring multiple frequent rest breaks. Patient completing Merrilee Jansky balance assessment scoring a 6/56 at this time indicating a high risk for falls. At this time, he requires at least CGA for all functional mobility, but with continued therapeutic intervention he should require less assist and this should be reflected in Berg score. Patient transferring back to wc via squat pivot with MinA. Attempting wc mobility with B UE, but due to poor L UE grip strength and coordination, patient unable to complete task. PT added Co-[place] to push rim to assist with grip with some success. He was able to propel the wc 41ft with MinA. Patient returning to room requesting to use bathroom. [place] stand pivot to toilet with consistent verbal cues for hand placement. 2nd person needed to complete clothing management, TotalA. Patient transferring back to wc via stand pivot with MaxA. Seatbelt alarm on, call light within reach.   Therapy Documentation Precautions:  Precautions Precautions: Fall Precaution Comments: [name], fluctuating tone in the LUE Restrictions Weight Bearing Restrictions: No    Therapy/Group: Individual Therapy  Karoline Caldwell, PT, DPT, CBIS  10/04/2020, 7:34 AM

## 2020-10-05 NOTE — Plan of Care (Signed)
  Problem: Consults Goal: RH STROKE PATIENT EDUCATION Description: See Patient Education module for education specifics  Outcome: Progressing   Problem: RH SAFETY Goal: RH STG ADHERE TO SAFETY PRECAUTIONS W/ASSISTANCE/DEVICE Description: STG Adhere to Safety Precautions With cues and reminders. Outcome: Progressing   Problem: RH PAIN MANAGEMENT Goal: RH STG PAIN MANAGED AT OR BELOW PT'S PAIN GOAL Description: Pain level less than 4 on scale of 0-10 Outcome: Progressing   Problem: RH KNOWLEDGE DEFICIT Goal: RH STG INCREASE KNOWLEDGE OF HYPERTENSION Description: Pt will be able adhere to medication regimen, dietary and lifestyle modification to better control blood pressure and prevent stroke using handouts and booklets provided with mod I assist.  Outcome: Progressing Goal: RH STG INCREASE KNOWLEDGE OF STROKE PROPHYLAXIS Description: Pt will be able adhere to medication regimen, dietary and lifestyle modification to better control blood pressure and prevent stroke using handouts and booklets provided with mod I assist.  Outcome: Progressing

## 2020-10-06 ENCOUNTER — Inpatient Hospital Stay (HOSPITAL_COMMUNITY): Payer: PPO

## 2020-10-06 ENCOUNTER — Inpatient Hospital Stay (HOSPITAL_COMMUNITY): Payer: PPO | Admitting: Occupational Therapy

## 2020-10-06 ENCOUNTER — Inpatient Hospital Stay (HOSPITAL_COMMUNITY): Payer: PPO | Admitting: Speech Pathology

## 2020-10-06 NOTE — Progress Notes (Signed)
PHYSICAL MEDICINE & REHABILITATION PROGRESS NOTE   Subjective/Complaints: Getting washed with OT Has no complaints  ROS- denies CP, SOB, N/V/D. No dysuria  Objective:   No results found. No results for input(s): WBC, HGB, HCT, PLT in the last 72 hours. No results for input(s): NA, K, CL, CO2, GLUCOSE, BUN, CREATININE, CALCIUM in the last 72 hours.  Intake/Output Summary (Last 24 hours) at 10/06/2020 1052 Last data filed at 10/06/2020 0823 Gross per 24 hour  Intake 596 ml  Output 725 ml  Net -129 ml    Physical Exam: Vital Signs Blood pressure 134/76, pulse 60, temperature 98.3 F (36.8 C), resp. rate 18, height 5\' 10"  (1.778 m), weight 84.4 kg, SpO2 95 %. Gen: no distress, normal appearing HEENT: oral mucosa pink and moist, NCAT Cardio: Reg rate Chest: normal effort, normal rate of breathing Abd: soft, non-distended Ext: no edema Skin: intact Neurologic: Cranial nerves II through XII intact, motor strength is 5/5 in RIght  deltoid, bicep, tricep, grip, hip flexor, knee extensors, ankle dorsiflexor and plantar flexor Brunnstrom 3 LUE Tone left pec and biceps Sensation to LT reported as equal BUE and BLE Musculoskeletal: Full range of motion in all 4 extremities. No joint swelling  Assessment/Plan: 1. Functional deficits which require 3+ hours per day of interdisciplinary therapy in a comprehensive inpatient rehab setting.  Physiatrist is providing close team supervision and 24 hour management of active medical problems listed below.  Physiatrist and rehab team continue to assess barriers to discharge/monitor patient progress toward functional and medical goals  Care Tool:  Bathing    Body parts bathed by patient: Chest, Abdomen, Right upper leg, Left upper leg, Right lower leg, Left lower leg, Face, Front perineal area, Right arm   Body parts bathed by helper: Buttocks, Left arm     Bathing assist Assist Level: Minimal Assistance - Patient > 75%      Upper Body Dressing/Undressing Upper body dressing   What is the patient wearing?: Pull over shirt    Upper body assist Assist Level: Moderate Assistance - Patient 50 - 74%    Lower Body Dressing/Undressing Lower body dressing      What is the patient wearing?: Incontinence brief, Pants     Lower body assist Assist for lower body dressing: Maximal Assistance - Patient 25 - 49%     Toileting Toileting    Toileting assist Assist for toileting: Dependent - Patient 0% (using Stedy)     Transfers Chair/bed transfer  Transfers assist     Chair/bed transfer assist level: Moderate Assistance - Patient 50 - 74%     Locomotion Ambulation   Ambulation assist      Assist level: 2 helpers (+2 mod A) Assistive device: Hand held assist Max distance: 17ft   Walk 10 feet activity   Assist     Assist level: 2 helpers (+2 mod A) Assistive device: Hand held assist   Walk 50 feet activity   Assist Walk 50 feet with 2 turns activity did not occur: Safety/medical concerns  Assist level: 2 helpers (+2 mod A) Assistive device: Hand held assist    Walk 150 feet activity   Assist Walk 150 feet activity did not occur: Safety/medical concerns         Walk 10 feet on uneven surface  activity   Assist Walk 10 feet on uneven surfaces activity did not occur: Safety/medical concerns         Wheelchair     Assist Will patient  use wheelchair at discharge?: Yes Type of Wheelchair: Manual    Wheelchair assist level: Moderate Assistance - Patient 50 - 74% Max wheelchair distance: 100    Wheelchair 50 feet with 2 turns activity    Assist        Assist Level: Moderate Assistance - Patient 50 - 74%   Wheelchair 150 feet activity     Assist      Assist Level: Maximal Assistance - Patient 25 - 49%   Blood pressure 134/76, pulse 60, temperature 98.3 F (36.8 C), resp. rate 18, height 5\' 10"  (1.778 m), weight 84.4 kg, SpO2 95 %.    Medical  Problem List and Plan: 1.  Left-sided weakness and facial droop secondary to acute infarct right basal ganglia/corona radiata on 09/26/2020 as well as history of astrocytoma with left frontal craniotomy resection 20 years ago             -patient may shower             -ELOS/Goals: modI 12-16 days  Continue CIR PT, OT, SLP, encourage patient to continue with full effort 2.  Antithrombotics: -DVT/anticoagulation: SCDs             -antiplatelet therapy: Aspirin 81 mg daily and Plavix 75 mg daily x3 weeks then aspirin alone 3. Pain Management: Tylenol as needed. Denies pain.  4. Mood: Lexapro 10 mg daily.  Provide emotional support             -antipsychotic agents: N/A 5. Neuropsych: This patient is capable of making decisions on his own behalf. 6. Skin/Wound Care: Routine skin checks 7. Fluids/Electrolytes/Nutrition: intake 632ml yesterday  , meal intake 70-75% Electrolytes stable on 11/13 8.  Seizure disorder.  Continue Lamictal 225 mg daily and 300 mg nightly, no seizure since rehab admission.  9.  Hyperlipidemia.  LDL 131 on 11/13. Continue Lipitor 10.  Hypothyroidism.  Synthroid 11.  Leukocytosis 11.6 afebrile. 12.  Occ bladder incont but usually cont- denied problems at home PTA 13.  Bowel incont ~50% colace to 200mg  BID, pt feels bowels are moving better  14.  Sleep is fair cont melatonin 5mg  15. Hyperbilirubinemia: 1.3 on 11/17. Monitor weekly.   LOS: 6 days A FACE TO FACE EVALUATION WAS PERFORMED  Howard White 10/06/2020, 10:52 AM

## 2020-10-06 NOTE — Progress Notes (Signed)
Occupational Therapy Session Note  Patient Details  Name: Howard White MRN: [id number] Date of Birth: [birth year]  Today's Date: 10/06/2020 OT Individual Time: 7342-8768 OT Individual Time Calculation (min): 57 min   Short Term Goals: Week 1:  OT Short Term Goal 1 (Week 1): Pt will complete UB dressing with min assist for two consecutive sessions. OT Short Term Goal 2 (Week 1): Pt will complete LB dressing with mod assist sit to stand for two consecutive sessions. OT Short Term Goal 3 (Week 1): Pt will complete LB bathing with min assist sit to stand for two consecutive sessions. OT Short Term Goal 4 (Week 1): Pt will complete self AAROM exercises for the LUE with supervision following handout.  Skilled Therapeutic Interventions/Progress Updates:    Pt greeted in bed with no c/o pain. Motivated to shower this morning. Mod A for supine<sit and CGA for sit<stand in [place]. He then transferred to the TTB. Supervision for static and dynamic sitting balance, pt able to wash both feet using figure 4 position with cuing alone. Min A to thoroughly wash [name], vcs for using one handed techniques to wash the [name]. Lateral leans for perihygiene. Dressing was then completed w/c level sit<stand in [place]. Max A for LB dressing tasks, Mod A for UB dressing. Min cuing for short term memory recall during oral care + other grooming tasks. Stedy used for toilet transfer/toileting with CGA-supervision for sit<stands. He opted to return to bed, left him in bed with hemiplegic side protected, all needs within reach, and bed alarm set. Tx focus placed on sit<stands, functional transfers, Lt NMR, and adaptive self care skills.   Therapy Documentation Precautions:  Precautions Precautions: Fall Precaution Comments: [name], fluctuating tone in the LUE Restrictions Weight Bearing Restrictions: No Pain: Pain Assessment Pain Scale: 0-10 Pain Score: 0-No pain Pain Location: Generalized Pain  Orientation: Lower Patients Stated Pain Goal: 0 Pain Intervention(s): Medication (See eMAR) Multiple Pain Sites: No ADL: ADL Eating: Set up Where Assessed-Eating: Wheelchair Grooming: Supervision/safety Where Assessed-Grooming: Wheelchair Upper Body Bathing: Minimal assistance Where Assessed-Upper Body Bathing: Edge of bed Lower Body Bathing: Moderate assistance Where Assessed-Lower Body Bathing: Edge of bed Upper Body Dressing: Moderate assistance Where Assessed-Upper Body Dressing: Edge of bed Lower Body Dressing: Maximal assistance Where Assessed-Lower Body Dressing: Edge of bed Toileting: Maximal assistance Where Assessed-Toileting: Bedside Commode Toilet Transfer: Maximal assistance Toilet Transfer Method: Stand pivot Science writer: Engineer, technical sales Transfer: Not assessed Social research officer, government: Not assessed      Therapy/Group: Individual Therapy  Raford Brissett A Ednamae Schiano 10/06/2020, 12:14 PM

## 2020-10-06 NOTE — Progress Notes (Signed)
Physical Therapy Session Note  Patient Details  Name: Howard White MRN: [id number] Date of Birth: April 28, 1961  Today's Date: 10/06/2020 PT Individual Time: 1110-1157 PT Individual Time Calculation (min): 47 min   Short Term Goals: Week 1:  PT Short Term Goal 1 (Week 1): Pt will transfer to and from [facility] with mod assist consistently PT Short Term Goal 2 (Week 1): Pt will propell WC 12ft with min assist PT Short Term Goal 3 (Week 1): Pt will ambulater 33ft with mod assist and LRAD  Skilled Therapeutic Interventions/Progress Updates:    Patient received supine in bed, agreeable to PT. He denies pain. Patient found to be wet from urinary incontinence- he was able to doff pants supine in bed with MinA and verbal cues for process- patient able to don new pants with ModA. [place] stand pivot to wc. He remains impulsive, often moving before PT and tech ready. PT transporting patient to gym in wc for energy conservation. He was able to ambulate 37ft x2 and 26ft x2 with MinA/ModA x2 HHA. Decreased L foot clearance, steppage gait R LE, upright posture/head maintained until he would turn and then he would collapse into significantly forward flexed posture with poor head control. Patient responsive to verbal cues, but demonstrates poor carryover from trial to trial. 1.5# ankle weights added for 58ft trials for improved proprioceptive input with good results on L LE. Patient able to achieve tall kneeling on therapy mat with ModA x2 and bench for B UE support. Weight bearing through L UE for improved proprioceptive input. With increased effort, patient noted to have increased flexor tone in L UE. He was able to complete dynamic reaching task weight shifting from R LE to L LE with MinA for postural control. Patient transitioning to quadruped able to complete R UE fwd/backward reaching task 2x5 and push up+ 2x10. Patient able to transition from quadruped to sidesitting to sitting edge of mat with MinA and verbal cues.  Patient transferred back to wc via stand pivot with MaxA. Increase in assist likely due to fatigue. Patient returning to room in wc, seatbelt alarm on, call light within reach.   Therapy Documentation Precautions:  Precautions Precautions: Fall Precaution Comments: [name], fluctuating tone in the LUE Restrictions Weight Bearing Restrictions: No   Therapy/Group: Individual Therapy  Karoline Caldwell, PT, DPT, CBIS  10/06/2020, 7:43 AM

## 2020-10-06 NOTE — Progress Notes (Signed)
Speech Language Pathology Daily Session Note  Patient Details  Name: Howard White MRN: [id number] Date of Birth: 09/15/1961  Today's Date: 10/06/2020 SLP Individual Time: 1219-7588 SLP Individual Time Calculation (min): 27 min  Short Term Goals: Week 1: SLP Short Term Goal 1 (Week 1): Pt will use compensatory strategies for speech intelligibility to achieve 90% intelligibilty at the sentence level with Min A verbal and visual cues. SLP Short Term Goal 2 (Week 1): Pt will demonstrate ability to problem solve basic to mildly complex tasks with Mod A verbal/visual cues. SLP Short Term Goal 3 (Week 1): Pt will identify 2 cognitive impairments with Mod A verbal/visual cues. SLP Short Term Goal 4 (Week 1): Pt will selectively attend to tasks with Min A verbal/visual cues.  Skilled Therapeutic Interventions: Pt was seen for skilled ST targeting cognitive goals. Pt required Mod faded to Min A verbal cues for use of memory notebook as compensatory aid to facilitated more accurate at detailed recall of earlier therapy activities. He also required Min A verbal cues for awareness of errors/mistakes within a visual logic calendar worksheet. He demonstrated better attention to task today in comparison to last visit with this ST - overall Supervision A verbal cues for redirection in a quiet environment. Pt left laying in bed with alarm set and needs within reach. Continue per current plan of care.          Pain Pain Assessment Pain Scale: 0-10 Pain Score: 0-No pain  Therapy/Group: Individual Therapy  Arbutus Leas 10/06/2020, 3:00 PM

## 2020-10-06 NOTE — Progress Notes (Signed)
Occupational Therapy Session Note  Patient Details  Name: Howard White MRN: [id number] Date of Birth: 12/13/1960  Today's Date: 10/06/2020 OT Individual Time: 1335-1419 OT Individual Time Calculation (min): 44 min    Short Term Goals: Week 1:  OT Short Term Goal 1 (Week 1): Pt will complete UB dressing with min assist for two consecutive sessions. OT Short Term Goal 2 (Week 1): Pt will complete LB dressing with mod assist sit to stand for two consecutive sessions. OT Short Term Goal 3 (Week 1): Pt will complete LB bathing with min assist sit to stand for two consecutive sessions. OT Short Term Goal 4 (Week 1): Pt will complete self AAROM exercises for the LUE with supervision following handout.  Skilled Therapeutic Interventions/Progress Updates:    Pt seen for OT session, with pt working on [employer].  Pt apologetic for not being finished as his lunch was late and he had staff and his mother in his room.  He reported increased "stress" as he had a lot of people in the room earlier.  He continued to repeat this throughout session and also reported slight abdominal pain as well.  Therapist worked on self feeding some with pt incorporating using the LUE for drinking from cup.  Mod assist needed for pt to grasp styrofoam cup and then bring to mouth.  This was repeated multiple times with some increased tone noted in the left elbow when attempting to reach out for the cup.  Also had him work on holding an apple slice in the left hand and self feed as well.  Max assist needed for tip to tip pinch as pt can exhibit gross grasp and some lateral pinch but not tip to tip.  He completed functional mobility to the bathroom without an assistive device and max assist.  He continues to exhibit flexed neck and trunk in standing with increased knee flexion as well.  Impulsive movements are noted at times with decreased coordination of stepping.  He completed toileting tasks with mod assist sit to stand.  He  then ambulated out to the sink for washing his hands with max assist as well in standing.  Finished session with pt in the wheelchair and alarm in place.  LUE supported on half lap tray with call button and phone in reach and nursing made aware of his abdominal pain.    Therapy Documentation Precautions:  Precautions Precautions: Fall Precaution Comments: [name], fluctuating tone in the LUE Restrictions Weight Bearing Restrictions: No  Pain: Pain Assessment Pain Scale: 0-10 Pain Score: 0-No pain Faces Pain Scale: Hurts a little bit Pain Type: Acute pain Pain Location: Abdomen Pain Orientation: Anterior;Lower Pain Descriptors / Indicators: Discomfort Pain Onset: On-going Pain Intervention(s): RN made aware ADL: See Care Tool Section for some details of mobility and selfcare  Therapy/Group: Individual Therapy  Carrson Lightcap OTR/L 10/06/2020, 4:44 PM

## 2020-10-06 NOTE — Progress Notes (Signed)
On this date, noted that patient had an incident during his therapy session. On site provider was consulted & asked to evaluate patient.

## 2020-10-06 NOTE — Progress Notes (Signed)
Speech Language Pathology Daily Session Note  Patient Details  Name: Howard White MRN: [id number] Date of Birth: 1960/11/16  Today's Date: 10/06/2020 SLP Individual Time: 1302-1330 SLP Individual Time Calculation (min): 28 min  Short Term Goals: Week 1: SLP Short Term Goal 1 (Week 1): Pt will use compensatory strategies for speech intelligibility to achieve 90% intelligibilty at the sentence level with Min A verbal and visual cues. SLP Short Term Goal 2 (Week 1): Pt will demonstrate ability to problem solve basic to mildly complex tasks with Mod A verbal/visual cues. SLP Short Term Goal 3 (Week 1): Pt will identify 2 cognitive impairments with Mod A verbal/visual cues. SLP Short Term Goal 4 (Week 1): Pt will selectively attend to tasks with Min A verbal/visual cues.  Skilled Therapeutic Interventions:Skilled ST services focused on cognitive skills. Pt was just receiving lunch tray when SLP entered room. SLP facilitated recall of today's events and created list in memory notebook referencing notes on Epic. Pt required min A vernal cues to recall today's events and to read notebook to recall weekend events. Pt required supervision A verbal cues to utilize slow rate when placing meal order. Pt was left in room with call bell within reach and chair alarm set. SLP recommends to continue skilled services.     Pain Pain Assessment Pain Scale: 0-10 Pain Score: 0-No pain  Therapy/Group: Individual Therapy  Kailiana Granquist  [facility] 10/06/2020, 4:23 PM

## 2020-10-07 ENCOUNTER — Inpatient Hospital Stay (HOSPITAL_COMMUNITY): Payer: PPO | Admitting: Occupational Therapy

## 2020-10-07 ENCOUNTER — Inpatient Hospital Stay (HOSPITAL_COMMUNITY): Payer: PPO | Admitting: Physical Therapy

## 2020-10-07 ENCOUNTER — Inpatient Hospital Stay (HOSPITAL_COMMUNITY): Payer: PPO | Admitting: Speech Pathology

## 2020-10-07 MED ORDER — MELATONIN 3 MG PO TABS
6.0000 mg | ORAL_TABLET | Freq: Every day | ORAL | Status: DC
  Administered 2020-10-07 – 2020-10-28 (×22): 6 mg via ORAL
  Filled 2020-10-07 (×23): qty 2

## 2020-10-07 NOTE — Progress Notes (Signed)
Occupational Therapy Session Note  Patient Details  Name: Howard White MRN: [id number] Date of Birth: 1961/10/18  Today's Date: 10/07/2020 OT Individual Time: 1330-1415 OT Individual Time Calculation (min): 45 min    Short Term Goals: Week 1:  OT Short Term Goal 1 (Week 1): Pt will complete UB dressing with min assist for two consecutive sessions. OT Short Term Goal 2 (Week 1): Pt will complete LB dressing with mod assist sit to stand for two consecutive sessions. OT Short Term Goal 3 (Week 1): Pt will complete LB bathing with min assist sit to stand for two consecutive sessions. OT Short Term Goal 4 (Week 1): Pt will complete self AAROM exercises for the LUE with supervision following handout. Week 2:     Skilled Therapeutic Interventions/Progress Updates:    1:1 Pt received in bed. Transitioned to the EOB with mod A and total instructional cues for activation on left side and for sequencing. Pt performed mod A stand pivot into w/c. Taken into the dayroom and engaged in [employer] with focus on normal patterns of movement. Also draw attention to decreasing associative movements in right side (Ue/LE) and in trunk with functional tasks. Also engaged in table top activities that focus on breaking up tone with gross movements that come in and out of flexor/ extension patterns. Pt with difficulty with elbow and wrist extension in functional reach - mod A to facilitate a more normal movement. Used table top activities including ring tree, bean bags and theraputty. Also engaged in standing with weight bearing through flat hand on table during reaching task with right hand in left field. Also performed PNF in supported sitting position. Supportive sitting helped to decrease associative trunk movements.   Transfer back into bed towards the right with a squat pivot with min A.   Left resting in the bed with call bell.      Therapy Documentation Precautions:  Precautions Precautions: Fall Precaution  Comments: [name], fluctuating tone in the LUE Restrictions Weight Bearing Restrictions: No General:   Vital Signs: Therapy Vitals Temp: 97.6 F (36.4 C) Pulse Rate: 71 Resp: 18 BP: 114/65 Patient Position (if appropriate): Lying Oxygen Therapy SpO2: 94 % O2 Device: Room Air Pain: No c/o pain in session   Therapy/Group: Individual Therapy  Willeen Cass [facility] 10/07/2020, 3:02 PM

## 2020-10-07 NOTE — Progress Notes (Signed)
Physical Therapy Session Note  Patient Details  Name: Howard White MRN: [id number] Date of Birth: 1961-03-09  Today's Date: 10/07/2020 PT Individual Time: 6629-4765 PT Individual Time Calculation (min): 43 min   Short Term Goals: Week 1:  PT Short Term Goal 1 (Week 1): Pt will transfer to and from [facility] with mod assist consistently PT Short Term Goal 2 (Week 1): Pt will propell WC 162ft with min assist PT Short Term Goal 3 (Week 1): Pt will ambulater 57ft with mod assist and LRAD  Skilled Therapeutic Interventions/Progress Updates:    Pt received supine in bed and agreeable to therapy session. Supine>sitting R EOB, HOB partially elevated, with min assist and pt relying heavily on R UE support via bedrail to assist with trunk upright - continues to demo L LE extensor tone and L UE flexor tone. Sitting EOB with supervision for sitting balance donned B LE TED hose and shoes max assist for time management. L squat pivot EOB>w/c with mod assist for lifting/pivoting hips - max cuing for sequencing and head/hips relationship. Pt continues to move quickly with poor safety awareness requiring repeated cuing for slow, controlled movement to increase safety. Transported to/from gym in w/c for time management and energy conservation. R squat pivot w/c>EOM with min assist for lifting/pivoting hips and continued cuing for head/hips relationship. Gait training ~114ft x2 with +2 HHA starting with +2 min assist progressing to +2 mod assist when turning and when becoming fatigued towards end of each walk (noted to be relying more heavily on R HHA today) - continues to demo excessive trunk/cervical flexion during turns as pt attempting to look down at feet - facilitation for R weight shift during stance to promote increased L LE foot clearance during swing with pt able to advance without assist but intermittently does not clear fully and lacks full step length resulting in increased instability, cuing for heel strike to  improve this. Standing>tall kneeling on mat table using UE support on bench with heavy mod assist of 1 and +2 min/mod assist as pt demos strong, excessive and persistent anterior trunk lean - noted L UE flexor tone significantly more prominent in this position (unable to facilitate L hand flat on bench for WBing due to tone), anticipate it is due to increased balance challenge. In tall kneeling worked on midline orientation (decreased anterior trunk lean) without R UE support with heavy mod assist then progressed to R UE reaching task to force R posterior weight shift to promote increased trunk extension with heavy mod assist for balance/trunk control. The tall kneeling position is very fatiguing to patient. Tall kneeling>quadruped with +2 mod assist for moving bench and L UE management > quadruped to R sidelying with mod assist. Returned to sitting EOM with min assist for trunk upright. L squat pivot EOM>w/c with mod assist for lifting/pivoting hips with pt demoing more impaired motor planning of head/hips relationship likely due to fatigue. Transported back to room and left sitting in w/c with needs in reach and seat belt alarm on.  Therapy Documentation Precautions:  Precautions Precautions: Fall Precaution Comments: [name], fluctuating tone in the LUE Restrictions Weight Bearing Restrictions: No  Pain:   No reports of pain throughout session.   Therapy/Group: Individual Therapy  Tawana Scale , PT, DPT, CSRS  10/07/2020, 7:51 AM

## 2020-10-07 NOTE — Progress Notes (Signed)
Orthopedic Tech Progress Note Patient Details:  Howard White 25-Jun-1961 [id number] Called in order to [employer] for a [employer] Patient ID: [name], male   DOB: 05-03-1961, 59 y.o.   MRN: [id number]   Janit Pagan 10/07/2020, 9:31 AM

## 2020-10-07 NOTE — Progress Notes (Signed)
[facility] PHYSICAL MEDICINE & REHABILITATION PROGRESS NOTE   Subjective/Complaints:  Slept  Poorly, Left hand getting a bit tight   ROS- neg CP, SOB, N/V/D. No dysuria  Objective:   No results found. No results for input(s): WBC, HGB, HCT, PLT in the last 72 hours. No results for input(s): NA, K, CL, CO2, GLUCOSE, BUN, CREATININE, CALCIUM in the last 72 hours.  Intake/Output Summary (Last 24 hours) at 10/07/2020 0901 Last data filed at 10/07/2020 0854 Gross per 24 hour  Intake 540 ml  Output 900 ml  Net -360 ml        Physical Exam: Vital Signs Blood pressure 125/77, pulse 60, temperature 98.4 F (36.9 C), resp. rate 17, height 5\' 10"  (1.778 m), weight 84.4 kg, SpO2 96 %.   General: No acute distress Mood and affect are appropriate Heart: Regular rate and rhythm no rubs murmurs or extra sounds Lungs: Clear to auscultation, breathing unlabored, no rales or wheezes Abdomen: Positive bowel sounds, soft nontender to palpation, nondistended Extremities: No clubbing, cyanosis, or edema Skin: No evidence of breakdown, no evidence of rash  Neurologic: Cranial nerves II through XII intact, motor strength is 5/5 in RIght  deltoid, bicep, tricep, grip, hip flexor, knee extensors, ankle dorsiflexor and plantar flexor Brunnstrom 3 LUE Tone MAS 2 in L finger , wris t, and elbow flexors  Tone left pec and biceps Sensation to LT reported as equal BUE and BLE Musculoskeletal: Full range of motion in all 4 extremities. No joint swelling  Assessment/Plan: 1. Functional deficits which require 3+ hours per day of interdisciplinary therapy in a comprehensive inpatient rehab setting.  Physiatrist is providing close team supervision and 24 hour management of active medical problems listed below.  Physiatrist and rehab team continue to assess barriers to discharge/monitor patient progress toward functional and medical goals  Care Tool:  Bathing    Body parts bathed by patient:  Chest, Abdomen, Right upper leg, Left upper leg, Right lower leg, Left lower leg, Face, Front perineal area, Right arm   Body parts bathed by helper: Buttocks, Left arm     Bathing assist Assist Level: Minimal Assistance - Patient > 75%     Upper Body Dressing/Undressing Upper body dressing   What is the patient wearing?: Pull over shirt    Upper body assist Assist Level: Moderate Assistance - Patient 50 - 74%    Lower Body Dressing/Undressing Lower body dressing      What is the patient wearing?: Incontinence brief, Pants     Lower body assist Assist for lower body dressing: Maximal Assistance - Patient 25 - 49%     Toileting Toileting    Toileting assist Assist for toileting: Moderate Assistance - Patient 50 - 74%     Transfers Chair/bed transfer  Transfers assist     Chair/bed transfer assist level: Moderate Assistance - Patient 50 - 74%     Locomotion Ambulation   Ambulation assist      Assist level: Maximal Assistance - Patient 25 - 49% Assistive device: Hand held assist Max distance: 10'   Walk 10 feet activity   Assist     Assist level: 2 helpers Assistive device: Hand held assist   Walk 50 feet activity   Assist Walk 50 feet with 2 turns activity did not occur: Safety/medical concerns  Assist level: 2 helpers (+2 mod A) Assistive device: Hand held assist    Walk 150 feet activity   Assist Walk 150 feet activity did not occur: Safety/medical  concerns         Walk 10 feet on uneven surface  activity   Assist Walk 10 feet on uneven surfaces activity did not occur: Safety/medical concerns         Wheelchair     Assist Will patient use wheelchair at discharge?: Yes Type of Wheelchair: Manual    Wheelchair assist level: Moderate Assistance - Patient 50 - 74% Max wheelchair distance: 100    Wheelchair 50 feet with 2 turns activity    Assist        Assist Level: Moderate Assistance - Patient 50 - 74%    Wheelchair 150 feet activity     Assist      Assist Level: Maximal Assistance - Patient 25 - 49%   Blood pressure 125/77, pulse 60, temperature 98.4 F (36.9 C), resp. rate 17, height 5\' 10"  (1.778 m), weight 84.4 kg, SpO2 96 %.    Medical Problem List and Plan: 1.  Left-sided weakness and facial droop secondary to acute infarct right basal ganglia/corona radiata on 09/26/2020 as well as history of astrocytoma with left frontal craniotomy resection 20 years ago             -patient may shower             -ELOS/Goals: modI 12-16 days CIR PT, OT, SLP, encourage patient to continue with full effort Increased tone LUE will add WHO 2.  Antithrombotics: -DVT/anticoagulation: SCDs             -antiplatelet therapy: Aspirin 81 mg daily and Plavix 75 mg daily x3 weeks then aspirin alone 3. Pain Management: Tylenol as needed. Denies pain 4. Mood: Lexapro 10 mg daily.  Provide emotional support             -antipsychotic agents: N/A 5. Neuropsych: This patient is capable of making decisions on his own behalf. 6. Skin/Wound Care: Routine skin checks 7. Fluids/Electrolytes/Nutrition: intake 643ml yesterday  , meal intake 70-75% Electrolytes stable on 11/13 8.  Seizure disorder.  Continue Lamictal 225 mg daily and 300 mg nightly, no seizure since rehab admission 9.  Hyperlipidemia.  LDL 131 on 11/13. Continue Lipitor 10.  Hypothyroidism.  Synthroid 11.  Leukocytosis 11.6 afebrile  12.  Occ bladder incont but usually cont- denied problems at home PTA 13.  Bowel incont ~50% colace to 200mg  BID, pt feels bowels are moving better  14.  Sleep is poor cont melatonin increase to 6mg  LOS: 7 days A FACE TO FACE EVALUATION WAS PERFORMED  Charlett Blake 10/07/2020, 9:01 AM

## 2020-10-07 NOTE — Progress Notes (Signed)
Patient ID: Howard White, male   DOB: 25-Oct-1961, 59 y.o.   MRN: [id number]   SW followed up with patient mother.  [place], [place]

## 2020-10-07 NOTE — Progress Notes (Signed)
Occupational Therapy Session Note  Patient Details  Name: Howard White MRN: [id number] Date of Birth: 05-22-1961  Today's Date: 10/07/2020 OT Individual Time: 2751-7001 OT Individual Time Calculation (min): 59 min    Short Term Goals: Week 1:  OT Short Term Goal 1 (Week 1): Pt will complete UB dressing with min assist for two consecutive sessions. OT Short Term Goal 2 (Week 1): Pt will complete LB dressing with mod assist sit to stand for two consecutive sessions. OT Short Term Goal 3 (Week 1): Pt will complete LB bathing with min assist sit to stand for two consecutive sessions. OT Short Term Goal 4 (Week 1): Pt will complete self AAROM exercises for the LUE with supervision following handout.  Skilled Therapeutic Interventions/Progress Updates:    Pt completed transfer from wheelchair to therapy mat with max assist squat pivot.  He then focused session on LUE neuromuscular re-education.  Had him work on maintaining anterior pelvic tilt with upright posture with all attempted weightbearing tasks.  Max demonstrational cueing needed to maintain posture secondary to decreased divided attention. Had the LUE placed in weightbearing beside of him while he worked on placing clothespins on a horizontal bar with the RUE.  Placed bar to incorporate weightbearing through the LUE as well as the LLE with transitions sit to squat.  Also worked on sustained activation of the elbow extensors and shoulder flexors to keep the left hand in contact with a board, with dycem on top of the board to help maintain contact.  Min to mod facilitation needed to maintain elbow extension without the having the hand to fall.  Finished work with emphasis on small movements of shoulder flexion on tilted board and with hand on washcloth.  Pt attempts to overuse the right side when attempting to move the LUE and grasps and flexes the right hand as well as flexing the trunk.  Max facilitation needed to maintain upright and midline  posture.  Finished session with transfer to the wheelchair with max assist and return to the room.  He was left up in the wheelchair with the call button and phone in reach and safety belt in place.    Therapy Documentation Precautions:  Precautions Precautions: Fall Precaution Comments: [name], fluctuating tone in the LUE Restrictions Weight Bearing Restrictions: No  Pain: Pain Assessment Pain Scale: Faces Pain Score: 0-No pain Faces Pain Scale: Hurts a little bit Pain Type: Acute pain Pain Location: Back Pain Descriptors / Indicators: Discomfort Pain Onset: Gradual (with sitting) Pain Intervention(s): Repositioned;Distraction ADL: See Care Tool Section for some details of mobility and selfcare  Therapy/Group: Individual Therapy  Tameria Patti OTR/L 10/07/2020, 12:24 PM

## 2020-10-07 NOTE — Progress Notes (Signed)
Speech Language Pathology Daily Session Note  Patient Details  Name: Howard White MRN: [id number] Date of Birth: [birth year]  Today's Date: 10/07/2020 SLP Individual Time: 1117-1200 SLP Individual Time Calculation (min): 43 min  Short Term Goals: Week 1: SLP Short Term Goal 1 (Week 1): Pt will use compensatory strategies for speech intelligibility to achieve 90% intelligibilty at the sentence level with Min A verbal and visual cues. SLP Short Term Goal 2 (Week 1): Pt will demonstrate ability to problem solve basic to mildly complex tasks with Mod A verbal/visual cues. SLP Short Term Goal 3 (Week 1): Pt will identify 2 cognitive impairments with Mod A verbal/visual cues. SLP Short Term Goal 4 (Week 1): Pt will selectively attend to tasks with Min A verbal/visual cues.  Skilled Therapeutic Interventions: Pt was seen for skilled ST targeting cognitive skills. Pt expressed need to void at beginning of session, therefore SLP and NT assisted pt with transfer to toilet via stedy. Pt with vast improvement with sequencing steps of transfer today in comparison to last time attempted in [place]. He still required Min A verbal and visual cueing for sequencing steps of transfers and washing hands at sink afterward. Pt also completed a semi-complex PEG board task in which he recreated designs from photographs on the PEG board with overall Min A verbal and visual cues for awareness of errors and problem solving. He selectively attended to task with Supervision A verbal cues for redirection, even when additional distractions present. Pt left sitting in chair with alarm set and needs within reach. Continue per current plan of care.          Pain Pain Assessment Pain Scale: Faces Pain Score: 0-No pain Faces Pain Scale: Hurts a little bit Pain Type: Acute pain Pain Location: Back Pain Descriptors / Indicators: Discomfort Pain Onset: Gradual (with sitting) Pain Intervention(s):  Repositioned;Distraction  Therapy/Group: Individual Therapy  Arbutus Leas 10/07/2020, 7:25 AM

## 2020-10-08 ENCOUNTER — Inpatient Hospital Stay (HOSPITAL_COMMUNITY): Payer: PPO

## 2020-10-08 ENCOUNTER — Inpatient Hospital Stay (HOSPITAL_COMMUNITY): Payer: PPO | Admitting: Speech Pathology

## 2020-10-08 ENCOUNTER — Ambulatory Visit: Payer: PPO

## 2020-10-08 ENCOUNTER — Inpatient Hospital Stay (HOSPITAL_COMMUNITY): Payer: PPO | Admitting: Occupational Therapy

## 2020-10-08 MED ORDER — EXERCISE FOR HEART AND HEALTH BOOK
Freq: Once | Status: AC
  Filled 2020-10-08: qty 1

## 2020-10-08 MED ORDER — BACLOFEN 5 MG HALF TABLET
5.0000 mg | ORAL_TABLET | Freq: Three times a day (TID) | ORAL | Status: DC
  Administered 2020-10-08 – 2020-10-29 (×63): 5 mg via ORAL
  Filled 2020-10-08 (×63): qty 1

## 2020-10-08 NOTE — Progress Notes (Signed)
Physical Therapy Session Note  Patient Details  Name: Howard White MRN: [id number] Date of Birth: 1961-07-21  Today's Date: 10/08/2020 PT Individual Time: 0800-0900 PT Individual Time Calculation (min): 60 min and 45 mins  Short Term Goals: Week 1:  PT Short Term Goal 1 (Week 1): Pt will transfer to and from [facility] with mod assist consistently PT Short Term Goal 2 (Week 1): Pt will propell WC 140ft with min assist PT Short Term Goal 3 (Week 1): Pt will ambulater 83ft with mod assist and LRAD  Skilled Therapeutic Interventions/Progress Updates:    Session 1: Patient received supine in bed, agreeable to PT. He denies pain and reports that he slept well last night. Patient able to don pants supine in bed with MinA and verbal cues for sequencing. Patient making multiple attempts to come to edge of bed before PT ready for patient to do so. Patient requiring CGA to come to edge of bed and fluctuating assist to maintain static sitting balance. With fatigue/distraction, patient will begin to fall back. Patient able to doff and don shirt with ModA and MinA/ModA to maintain balance. Patient able to transfer to wc via stand pivot with ModA and consistent verbal cues for sequencing/safety. PT propelling patient to therapy gym for time management and energy conservation. He ambulated the following distances with [place] x2 HHA: 10ftx2, 35ftx2, 41ftx3, 54ft. Seated rest breaks between bouts. He continues to require up to Max verbal cues for upright posture/improved head control + L LE foot clearance. Patient with limited ability to divide attention, so given gait task, patient with motor perseverations of R UE and strong grip onto PTs hand and pushing with R UE. With fatigue/fluctuating attention, patient L foot clearance will worsen. Patients posture will worsen, become more flexed, with turns, but he is responsive to verbal cues to return to upright posture. Dynamic standing balance with MinA/ModA matching playing  cards. He requires manual facilitation to achieve wider base of support in standing. Patient will naturally choose a very narrow BOS and stand up impulsively, requiring up to MaxA from PT to assist with maintaining balance. With prolonged standing, patients B knees beginning to flex. Patient reporting discomfort with current wc back, PT was unable to successfully adjust back angle, but patient then stating that it felt much more comfortable. Patient completing the following pregait exercises: L LE progression advancement to tareget with emphasis onR weight shift. Due to patients impulsivity, he rarely resets his feet resulting in subsequent LOB on next trial. Patient minimally receptive to tactile cuing to maintain R hip against PTs right hip to encourage full R weight shift in order to advance L LE successfully. Patient returning to room in wc, seatbelt alarm on, call light within reach.     Session 2: Patient received sitting up in chair agreeable to PT. He denies pain, but requests to use bathroom. Stedy used for safe and efficient transfer to toilet. He was able to urinate into urinal while seated, but no bowel movement. TotalA for clothing management in standing. Patient able to stand with ModA + and MinA to remain standing while PT donned LiteGait harness. Patient ambulating 174ft x2 in [place] over ground with MaxA to manually facilitate R weight shift. Consistent and Max verbal cues for head control and L foot clearance. Despite addition of LiteGait to attempt to absorb focus on postural control, patient unable to maintain attention on head control or L foot clearance. Patient requiring extended seated rest break between bouts. He returned to room  in wc, MaxA squat pivot to wc. Patient attempting to lie straight back onto the bed, as opposed to laying down laterally so that head would land on pillow. MaxA from PT to ensure that patient laid down in bed correctly. Bed alarm on, call light within reach.     Therapy Documentation Precautions:  Precautions Precautions: Fall Precaution Comments: [name], fluctuating tone in the LUE Restrictions Weight Bearing Restrictions: No    Therapy/Group: Individual Therapy  Karoline Caldwell, PT, DPT, CBIS  10/08/2020, 7:34 AM

## 2020-10-08 NOTE — Progress Notes (Signed)
Physical Therapy Weekly Progress Note  Patient Details  Name: Howard White MRN: [id number] Date of Birth: [birth year]  Beginning of progress report period: October 01, 2020 End of progress report period: October 08, 2020   Patient has met 0 of 3 short term goals.  Patient is making slow progress toward his goals. He remains significantly limited by decreased attention, awareness of deficits, fluctuating tone and poor safety awareness. At this point, patient fluctuates from CGA to [place] for bed <> wc transfers depending on level of attention and level of fatigue. He is able to ambulate ~136f with ModA x2 HHA. He demonstrates an ataxic, uncoordinated gait pattern and poor postural endurance for upright posture. He remains impulsive and requires multimodal cues to complete functional tasks safely. Patient has progressed with functional B LE strength with good potential for carryover to functional tasks.   Patient continues to demonstrate the following deficits muscle weakness and muscle joint tightness, decreased cardiorespiratoy endurance, impaired timing and sequencing, abnormal tone, unbalanced muscle activation, motor apraxia, ataxia, decreased coordination and decreased motor planning, decreased visual perceptual skills, decreased midline orientation and decreased motor planning, decreased initiation, decreased attention, decreased awareness, decreased problem solving, decreased safety awareness, decreased memory and delayed processing and decreased sitting balance, decreased standing balance, decreased postural control, hemiplegia and decreased balance strategies and therefore will continue to benefit from skilled PT intervention to increase functional independence with mobility.  Patient progressing toward long term goals..  Continue plan of care.  PT Short Term Goals Week 1:  PT Short Term Goal 1 (Week 1): [facility] Short Term Goal 1 -  Progress (Week 1): Progressing toward goal PT Short Term Goal 2 (Week 1): Pt will propell WC 1042fwith min assist PT Short Term Goal 2 - Progress (Week 1): Progressing toward goal PT Short Term Goal 3 (Week 1): Pt will ambulater 3070fith mod assist and LRAD PT Short Term Goal 3 - Progress (Week 1): Progressing toward goal Week 2:  PT Short Term Goal 1 (Week 2): [facility] PT Short Term Goal 2 (Week 2): Pt will propell WC [id number] min assist PT Short Term Goal 3 (Week 2): Pt will ambulater 30ft85fh mod assist and LRAD PT Short Term Goal 4 (Week 2): Patient will transition supine <> sit edge of bed with CGA and verbal cues as needed consistently    Therapy Documentation Precautions:  Precautions Precautions: Fall Precaution Comments: [name], fluctuating tone in the LUE Restrictions Weight Bearing Restrictions: No    JenniDebbora Dus4/2021, 7:40 AM

## 2020-10-08 NOTE — Progress Notes (Signed)
Patient ID: Howard White, male   DOB: 05-06-1961, 59 y.o.   MRN: [id number] Team Conference Report to Patient/Family  Team Conference discussion was reviewed with the patient and caregiver, including goals, any changes in plan of care and target discharge date.  Patient and caregiver express understanding and are in agreement.  The patient has a target discharge date of 10/29/20.  [name] 10/08/2020, 1:15 PM

## 2020-10-08 NOTE — Progress Notes (Signed)
Patient ID: Howard White, male   DOB: 07-29-1961, 59 y.o.   MRN: [id number] Met with the patient to review role of the nurse CM and collaboration with the SW. Patient with STMD required repeated instructions and review of the information during the conversation. Was able to note understanding of educational material to be left at the bedside for his mother to review. Patient given information regarding dyslipidemia and DAPT per MD to switch over to ASA solo after 3 weeks. Also reviewed exercise and the heart and effect on cholesterol level (LDL). Nursing to follow up with mother and will continue to follow along to discharge for educational needs/additional information. Margarito Liner

## 2020-10-08 NOTE — Progress Notes (Signed)
Occupational Therapy Weekly Progress Note  Patient Details  Name: Howard White MRN: [id number] Date of Birth: 1961/06/24  Beginning of progress report period: October 01, 2020 End of progress report period: October 08, 2020  Today's Date: 10/08/2020 OT Individual Time: 6333-5456 OT Individual Time Calculation (min): 62 min    Patient has met 1 of 4 short term goals.  Pt is making steady but slow progress with OT at this time.  He currently needs min assist for UB bathing with max assist for UB dressing.  He was able to complete LB bathing with mod assist sit to stand and LB dressing with max assist overall.  He continues to demonstrate decreased sustained attention to tasks and gets internally distracted when trying to focus on posture and selfcare.  He continues to need max instructional cueing for sequencing bathing tasks overall as well as dressing.  Max hand over hand assist was needed to integrate the LUE for washing the right arm and LLE.  Increased tone noted in the elbow flexors and digit flexors with attempted use.  He is able to use the LUE with min assist for holding the soap and deodorant when removing the top.  He currently needs mod assist for squat pivot transfers, but does need max assist at times for stand pivot transfers secondary to inconsistency and impulsivity with transitions.  Overall, he continues to need mod to max assist and will continue to benefit from CIR level therapies with expected discharge on 12/15.    Patient continues to demonstrate the following deficits: muscle weakness and muscle paralysis, impaired timing and sequencing, abnormal tone, unbalanced muscle activation and decreased coordination, decreased motor planning, decreased attention, decreased awareness, decreased problem solving, decreased safety awareness, decreased memory and delayed processing and decreased sitting balance, decreased standing balance, decreased postural control, hemiplegia and  decreased balance strategies and therefore will continue to benefit from skilled OT intervention to enhance overall performance with BADL and Reduce care partner burden.  Patient progressing toward long term goals..  Continue plan of care.  OT Short Term Goals Week 2:  OT Short Term Goal 1 (Week 2): Pt will complete UB dressing with min assist for two consecutive sessions. OT Short Term Goal 2 (Week 2): Pt will complete LB bathing with min assist sit to stand for two consecutive sessions. OT Short Term Goal 3 (Week 2): Pt will complete toilet transfers with mod assist stand pivot for two consecutive session. OT Short Term Goal 4 (Week 2): Pt will use the LUE for washing the RUE with mod facilitation for two consecutive sessions.  Skilled Therapeutic Interventions/Progress Updates:    Pt worked on toileting to start session with transition to the shower for bathing after completion.  He needed mod assist sit to stand for toilet hygiene and then took 3-4 small steps with mod assist to the shower seat.  He then worked on undressing with supervision and min instructional cueing for doffing pullover shirt.  He then needed min assist for removal of socks and shoes.  Therapist had assisted with removal of shorts at max assist level as pt slightly urinated on them sitting on the toilet.  He needed max instructional cueing for sequencing bathing with max hand over hand for integration of the LUE. Min assist for sit to stand with mod assist to maintain standing balance while washing front and back peri area.  Max demonstrational cueing for upright posture and for sequencing sit to stand when completing all transitional movements.  He  completed stand pivot transfer to the wheelchair with mod assist for dressing.  Mod assist for donning brief and pants following hemi technique as well as max assist to donn long sleeve pullover shirt.  Pt exhibits increased difficulty orienting the shirt and determining the parts of  it.  Therapist provided total assist for TEDs with pt donning his shoes on his feet with mod assist.  Total assist was needed for tying shoes.  [name] in the room at end of session.  Discussed expectations of LOS as well as family education prior to discharge and she was thankful.  Pt left with the call button and phone in reach and safety belt in place.    Therapy Documentation Precautions:  Precautions Precautions: Fall Precaution Comments: [name], fluctuating tone in the LUE Restrictions Weight Bearing Restrictions: No  Pain: Pain Assessment Pain Scale: Faces Pain Score: 0-No pain ADL: See Care Tool Section for some details of mobility and selfcare  Therapy/Group: Individual Therapy  Luwana Butrick OTR/L 10/08/2020, 12:23 PM

## 2020-10-08 NOTE — Progress Notes (Signed)
Speech Language Pathology Weekly Progress and Session Note  Patient Details  Name: Howard White MRN: [id number] Date of Birth: 18-Oct-1961  Beginning of progress report period: October 01, 2020 End of progress report period: November 31, 2021  Today's Date: 10/08/2020 SLP Individual Time: 1345-1430 SLP Individual Time Calculation (min): 45 min  Short Term Goals: Week 1: SLP Short Term Goal 1 (Week 1): Pt will use compensatory strategies for speech intelligibility to achieve 90% intelligibilty at the sentence level with Min A verbal and visual cues. SLP Short Term Goal 1 - Progress (Week 1): Met SLP Short Term Goal 2 (Week 1): Pt will demonstrate ability to problem solve basic to mildly complex tasks with Mod A verbal/visual cues. SLP Short Term Goal 2 - Progress (Week 1): Met SLP Short Term Goal 3 (Week 1): Pt will identify 2 cognitive impairments with Mod A verbal/visual cues. SLP Short Term Goal 3 - Progress (Week 1): Met SLP Short Term Goal 4 (Week 1): Pt will selectively attend to tasks with Min A verbal/visual cues. SLP Short Term Goal 4 - Progress (Week 1): Met    New Short Term Goals: Week 2: SLP Short Term Goal 1 (Week 2): Patient will achieve 90% or greater speech intelligibility at conversational level with minA cues for speech strategy use SLP Short Term Goal 2 (Week 2): Patient will utilize external memory aides for orientation to time, recall of recent events (therapy sessions, etc) with minA to initiate use. SLP Short Term Goal 3 (Week 2): Patient will complete mild complex sequencing, organizing, categorizing tasks with 85% accuracy and minA cues. SLP Short Term Goal 4 (Week 2): Patient will demonstrate anticipatory awareness to deficits and impact on function as well as discharge plan (home with parents) with modA cues.  Weekly Progress Updates:  Patient made good progress and met 4/4 STG's. He does continue to exhibit deficits in memory/short term recall and requires  cues to utilize memory aides [employer], etc). He requires verbal cues to direct and redirect his attention during structured and functional tasks. He is utilizing speech intelligibility strategies more effectively during structured conversation and oral reading.   Intensity: Minumum of 1-2 x/day, 30 to 90 minutes Frequency: 3 to 5 out of 7 days Duration/Length of Stay: 12/15 Treatment/Interventions: Cognitive remediation/compensation;Cueing hierarchy;Speech/Language facilitation;Internal/external aids;Patient/family education;Functional tasks   Daily Session  Skilled Therapeutic Interventions: Patient seen to address speech and cognitive function goals. Patient did not recall SLP (had first met him on Saturday) but he did recall OT's name as well as PT and OT sessions earlier today. He demonstrated more even tempo and vocal intensity of speech during oral reading and during semi-structured conversation, but he starts to become more dysarthric (patient with some awareness to this) after extended periods of reading aloud, and/or talking. Patient completed categorization task with photo cards, requiring only minA cues for attention to errors. Patient continues to benefit from skilled SLP intervention to maximize cognitive-linguistic and speech function prior to discharge.    General    Pain Pain Assessment Pain Scale: 0-10 Pain Score: 0-No pain  Therapy/Group: Individual Therapy   Sonia Baller, MA, CCC-SLP Speech Therapy

## 2020-10-08 NOTE — Patient Care Conference (Signed)
Inpatient RehabilitationTeam Conference and Plan of Care Update Date: 10/08/2020   Time: 10:33 AM    Patient Name: Howard White      Medical Record Number: [id number]  Date of Birth: May 15, 1961 Sex: Male         Room/Bed: 4W13C/4W13C-01 Payor Info: Payor: [employer] / Plan: [employer] Must PPO / Product Type: *No Product type* /    Admit Date/Time:  09/30/2020  6:21 PM  Primary Diagnosis:  Cerebrovascular accident (CVA) of right basal ganglia [facility])  Hospital Problems: Principal Problem:   Cerebrovascular accident (CVA) of right basal ganglia [facility])    Expected Discharge Date: Expected Discharge Date: 10/29/20  Team Members Present: Physician leading conference: Dr. Leeroy Cha Care Coodinator Present: Dorien Chihuahua, RN, BSN, CRRN;Christina Sampson Goon, [place] Nurse Present: Other (comment) Demetrios Loll, RN) PT Present: Estevan Ryder, PT OT Present: Clyda Greener, OT SLP Present: Nadara Mode, SLP PPS Coordinator present : Gunnar Fusi, Novella Olive, PT     Current Status/Progress Goal Weekly Team Focus  Bowel/Bladder   Continent/Incontinent of B/B LBM 10/08/2020  Remain Continent  Assess timed toileting Q2 hrs   Swallow/Nutrition/ Hydration             ADL's   Min assist for UB bathing and mod assist for UB dressing.  LB bathing is mod assist sit to stand with dressing at max assist.  Toilet transfers are at max assist as well.  LUE function is at a gross assist level.  Still with decreased memory and recall as well.  min assist overall  selfcare retraining, transfer retraining, balance retraining, neuromuscular re-education, cognitive retraining, DME education, pt education   Mobility   min/mod assist supine<>sit, min/mod assist sit<>stands, mod assist squat pivot transfers, +2 mod HHA gait up to 158ft; impaired safety awareness with quick/impulsive movements  min assist overall at ambulatory level  activity tolerance, pt education, transfer  training, gait training, dynamic standing balance, L LE NMR, dynamic sitting balance, midline orientation   Communication   90-95% intelligible, Supervision-Min A intelligibility strategies  Supervision A  carryover speech strategies during functional and more cognitvely demanding tasks   Safety/Cognition/ Behavioral Observations  Min A-Mod A (depending on level of anxiety and distraction)  Supervision-Min  emergent awareness, selective attention, recall with strategies, semi-complex problem solving   Pain   No C/O pain  remain pain free  Assess pain Qshift and PRN, reassess PRN   Skin   Skin intact  Maintain skin integrity  Assess Qshift and PRN     Discharge Planning:  D/C home with parents (mother-primary caregiver) 24/7. 2 level home (able to stay on 1st floor)   Team Discussion: Progress limited by STMD, anxiety, distractibility and flexor tone. Note flexor tone with movement and impulsive sporatic movements with activities. Poor safety awareness and decreased divided attention.  Requires cues for speech intelligibility. Discussed limited support at home with current need for min assist upper body bathing, mod assist for dressing upper body and mod-max assistance for lower body bathing and dressing.  Patient on target to meet rehab goals: yes, min assist goals set  *See Care Plan and progress notes for long and short-term goals.   Revisions to Treatment Plan:  MD to assess need for baclofen for tone  Teaching Needs: Transfers, toileting, medications, etc  Current Barriers to Discharge: Decreased caregiver support  Possible Resolutions to Barriers: Family education with mother and other support people     Medical Summary Current Status: Vitals are stable, fluctuating LUE tone, currently  requiring MaxA and lives with elderly parents, occasional bowel incontinence, poor sleep  Barriers to Discharge: Medical stability;Decreased family/caregiver support  Barriers to Discharge  Comments: Fluctuating LUE tone, currently requiring MaxA and lives with elderly parents, occasional bowel incontinence, poor sleep Possible Resolutions to [employer] Focus: Will discuss with patient stating baclofen, conitnue WHO, colace changed to 200mg  BID, increase melatonin to 6mg .   Continued Need for Acute Rehabilitation Level of Care: The patient requires daily medical management by a physician with specialized training in physical medicine and rehabilitation for the following reasons: Direction of a multidisciplinary physical rehabilitation program to maximize functional independence : Yes Medical management of patient stability for increased activity during participation in an intensive rehabilitation regime.: Yes Analysis of laboratory values and/or radiology reports with any subsequent need for medication adjustment and/or medical intervention. : Yes   I attest that I was present, lead the team conference, and concur with the assessment and plan of the team.   Dorien Chihuahua B 10/08/2020, 1:18 PM

## 2020-10-08 NOTE — Progress Notes (Addendum)
[place] PHYSICAL MEDICINE & REHABILITATION PROGRESS NOTE   Subjective/Complaints: Vitals stable Flexor leg tone- has been having difficulty controlling his movements.   ROS- denies CP, SOB, N/V/D. No dysuria  Objective:   No results found. No results for input(s): WBC, HGB, HCT, PLT in the last 72 hours. No results for input(s): NA, K, CL, CO2, GLUCOSE, BUN, CREATININE, CALCIUM in the last 72 hours.  Intake/Output Summary (Last 24 hours) at 10/08/2020 1034 Last data filed at 10/08/2020 6295 Gross per 24 hour  Intake 360 ml  Output 1100 ml  Net -740 ml        Physical Exam: Vital Signs Blood pressure 121/78, pulse 60, temperature (!) 97.5 F (36.4 C), resp. rate 18, height 5\' 10"  (1.778 m), weight 84.4 kg, SpO2 95 %. Gen: no distress, normal appearing HEENT: oral mucosa pink and moist, NCAT Cardio: Reg rate Chest: normal effort, normal rate of breathing Abd: soft, non-distended Ext: no edema Skin: intact Neurologic: Cranial nerves II through XII intact, motor strength is 5/5 in RIght  deltoid, bicep, tricep, grip, hip flexor, knee extensors, ankle dorsiflexor and plantar flexor Brunnstrom 3 LUE Tone MAS 2 in L finger , wris t, and elbow flexors  Tone left pec and biceps Sensation to LT reported as equal BUE and BLE Musculoskeletal: Full range of motion in all 4 extremities. No joint swelling  Assessment/Plan: 1. Functional deficits which require 3+ hours per day of interdisciplinary therapy in a comprehensive inpatient rehab setting.  Physiatrist is providing close team supervision and 24 hour management of active medical problems listed below.  Physiatrist and rehab team continue to assess barriers to discharge/monitor patient progress toward functional and medical goals  Care Tool:  Bathing    Body parts bathed by patient: Chest, Abdomen, Right upper leg, Left upper leg, Right lower leg, Left lower leg, Face, Front perineal area, Right arm   Body parts  bathed by helper: Buttocks, Left arm     Bathing assist Assist Level: Minimal Assistance - Patient > 75%     Upper Body Dressing/Undressing Upper body dressing   What is the patient wearing?: Pull over shirt    Upper body assist Assist Level: Moderate Assistance - Patient 50 - 74%    Lower Body Dressing/Undressing Lower body dressing      What is the patient wearing?: Incontinence brief, Pants     Lower body assist Assist for lower body dressing: Maximal Assistance - Patient 25 - 49%     Toileting Toileting    Toileting assist Assist for toileting: Moderate Assistance - Patient 50 - 74%     Transfers Chair/bed transfer  Transfers assist     Chair/bed transfer assist level: Moderate Assistance - Patient 50 - 74% (squat pivot)     Locomotion Ambulation   Ambulation assist      Assist level: 2 helpers (+2 mod A) Assistive device: Hand held assist Max distance: 125ft   Walk 10 feet activity   Assist     Assist level: 2 helpers (+2 mod A) Assistive device: Hand held assist   Walk 50 feet activity   Assist Walk 50 feet with 2 turns activity did not occur: Safety/medical concerns  Assist level: 2 helpers (+2 mod A) Assistive device: Hand held assist    Walk 150 feet activity   Assist Walk 150 feet activity did not occur: Safety/medical concerns         Walk 10 feet on uneven surface  activity   Assist Walk  10 feet on uneven surfaces activity did not occur: Safety/medical concerns         Wheelchair     Assist Will patient use wheelchair at discharge?: Yes Type of Wheelchair: Manual    Wheelchair assist level: Moderate Assistance - Patient 50 - 74% Max wheelchair distance: 100    Wheelchair 50 feet with 2 turns activity    Assist        Assist Level: Moderate Assistance - Patient 50 - 74%   Wheelchair 150 feet activity     Assist      Assist Level: Maximal Assistance - Patient 25 - 49%   Blood pressure  121/78, pulse 60, temperature (!) 97.5 F (36.4 C), resp. rate 18, height 5\' 10"  (1.778 m), weight 84.4 kg, SpO2 95 %.    Medical Problem List and Plan: 1.  Left-sided weakness and facial droop secondary to acute infarct right basal ganglia/corona radiata on 09/26/2020 as well as history of astrocytoma with left frontal craniotomy resection 20 years ago             -patient may shower             -ELOS/Goals: modI 12-16 days CIR PT, OT, SLP, encourage patient to continue with full effort Increased tone LUE will add WHO, fluctuating tone is inhibiting therapy-  Discussed baclofen with him this morning and he would like to try- added 5mg  TID. 2.  Antithrombotics: -DVT/anticoagulation: SCDs             -antiplatelet therapy: Aspirin 81 mg daily and Plavix 75 mg daily x3 weeks then aspirin alone 3. Pain Management: Tylenol as needed. Denies pain 4. Mood: Lexapro 10 mg daily.  Provide emotional support             -antipsychotic agents: N/A 5. Neuropsych: This patient is capable of making decisions on his own behalf. 6. Skin/Wound Care: Routine skin checks 7. Fluids/Electrolytes/Nutrition: intake 656ml yesterday  , meal intake 70-75% Electrolytes stable on 11/13 8.  Seizure disorder.  Continue Lamictal 225 mg daily and 300 mg nightly, no seizure since rehab admission 9.  Hyperlipidemia.  LDL 131 on 11/13. Continue Lipitor 10.  Hypothyroidism.  Synthroid 11.  Leukocytosis 11.6 afebrile  12.  Occ bladder incont but usually cont- denied problems at home PTA 13.  Bowel incont ~50% colace to 200mg  BID, pt feels bowels are moving better  14.  Sleep is poor cont melatonin increase to 6mg  LOS: 8 days A FACE TO FACE EVALUATION WAS PERFORMED  [name] Markiya Keefe 10/08/2020, 10:34 AM

## 2020-10-09 NOTE — Progress Notes (Signed)
[place] PHYSICAL MEDICINE & REHABILITATION PROGRESS NOTE   Subjective/Complaints:  Pt reports feels great- slept great.   Pees a lot because he drinks a lot of water.  LBM last night- no constipation.   ROS-  Pt denies SOB, abd pain, CP, N/V/C/D, and vision changes   Objective:   No results found. No results for input(s): WBC, HGB, HCT, PLT in the last 72 hours. No results for input(s): NA, K, CL, CO2, GLUCOSE, BUN, CREATININE, CALCIUM in the last 72 hours.  Intake/Output Summary (Last 24 hours) at 10/09/2020 1033 Last data filed at 10/09/2020 0740 Gross per 24 hour  Intake 400 ml  Output 300 ml  Net 100 ml        Physical Exam: Vital Signs Blood pressure 109/75, pulse (!) 59, temperature 97.8 F (36.6 C), resp. rate 17, height 5\' 10"  (1.778 m), weight 84.4 kg, SpO2 98 %. Gen: awake, alert, sitting up in bedside chair, NAD HEENT: conjugate gaze Cardio: bradycardic- regular rhythm Chest: CTA B/L- no W/R/R- good air movement Abd: Soft, NT, ND, (+)BS  Ext: no edema Skin: intact Neurologic: Cranial nerves II through XII intact, motor strength is 5/5 in RIght  deltoid, bicep, tricep, grip, hip flexor, knee extensors, ankle dorsiflexor and plantar flexor Brunnstrom 3 LUE Tone MAS 2 in L finger , wris t, and elbow flexors  Tone left pec and biceps Sensation to LT reported as equal BUE and BLE Musculoskeletal: Full range of motion in all 4 extremities. No joint swelling  Assessment/Plan: 1. Functional deficits which require 3+ hours per day of interdisciplinary therapy in a comprehensive inpatient rehab setting.  Physiatrist is providing close team supervision and 24 hour management of active medical problems listed below.  Physiatrist and rehab team continue to assess barriers to discharge/monitor patient progress toward functional and medical goals  Care Tool:  Bathing    Body parts bathed by patient: Chest, Abdomen, Right upper leg, Left upper leg, Right  lower leg, Left lower leg, Face, Front perineal area, Right arm   Body parts bathed by helper: Buttocks, Left arm     Bathing assist Assist Level: Moderate Assistance - Patient 50 - 74%     Upper Body Dressing/Undressing Upper body dressing   What is the patient wearing?: Pull over shirt    Upper body assist Assist Level: Maximal Assistance - Patient 25 - 49%    Lower Body Dressing/Undressing Lower body dressing      What is the patient wearing?: Incontinence brief, Pants     Lower body assist Assist for lower body dressing: Moderate Assistance - Patient 50 - 74%     Toileting Toileting    Toileting assist Assist for toileting: Moderate Assistance - Patient 50 - 74%     Transfers Chair/bed transfer  Transfers assist     Chair/bed transfer assist level: Maximal Assistance - Patient 25 - 49%     Locomotion Ambulation   Ambulation assist      Assist level: 2 helpers Assistive device: Lite Gait Max distance: 150   Walk 10 feet activity   Assist     Assist level: 2 helpers Assistive device: Lite Gait   Walk 50 feet activity   Assist Walk 50 feet with 2 turns activity did not occur: Safety/medical concerns  Assist level: 2 helpers Assistive device: Lite Gait    Walk 150 feet activity   Assist Walk 150 feet activity did not occur: Safety/medical concerns  Assist level: 2 helpers Assistive device: Economist  Walk 10 feet on uneven surface  activity   Assist Walk 10 feet on uneven surfaces activity did not occur: Safety/medical concerns         Wheelchair     Assist Will patient use wheelchair at discharge?: Yes Type of Wheelchair: Manual    Wheelchair assist level: Moderate Assistance - Patient 50 - 74% Max wheelchair distance: 100    Wheelchair 50 feet with 2 turns activity    Assist        Assist Level: Moderate Assistance - Patient 50 - 74%   Wheelchair 150 feet activity     Assist      Assist Level:  Maximal Assistance - Patient 25 - 49%   Blood pressure 109/75, pulse (!) 59, temperature 97.8 F (36.6 C), resp. rate 17, height 5\' 10"  (1.778 m), weight 84.4 kg, SpO2 98 %.    Medical Problem List and Plan: 1.  Left-sided weakness and facial droop secondary to acute infarct right basal ganglia/corona radiata on 09/26/2020 as well as history of astrocytoma with left frontal craniotomy resection 20 years ago             -patient may shower             -ELOS/Goals: modI 12-16 days CIR PT, OT, SLP, encourage patient to continue with full effort Increased tone LUE will add WHO, fluctuating tone is inhibiting therapy-  Discussed baclofen with him this morning and he would like to try- added 5mg  TID.  11/25- denies as bad as spasms/tightness- con't Baclofen 2.  Antithrombotics: -DVT/anticoagulation: SCDs             -antiplatelet therapy: Aspirin 81 mg daily and Plavix 75 mg daily x3 weeks then aspirin alone 3. Pain Management: Tylenol as needed. Denies pain  11/25- denies pain- con't tylenol prn 4. Mood: Lexapro 10 mg daily.  Provide emotional support             -antipsychotic agents: N/A 5. Neuropsych: This patient is capable of making decisions on his own behalf. 6. Skin/Wound Care: Routine skin checks 7. Fluids/Electrolytes/Nutrition: intake 649ml yesterday  , meal intake 70-75% Electrolytes stable on 11/13  11/25- said drinking a lot and voiding a lot 8.  Seizure disorder.  Continue Lamictal 225 mg daily and 300 mg nightly, no seizure since rehab admission 9.  Hyperlipidemia.  LDL 131 on 11/13. Continue Lipitor 10.  Hypothyroidism.  Synthroid 11.  Leukocytosis 11.6 afebrile  12.  Occ bladder incont but usually cont- denied problems at home PTA 13.  Bowel incont ~50% colace to 200mg  BID, pt feels bowels are moving better  14.  Sleep is poor cont melatonin increase to 6mg   11/25- slept "great"- con't regimen LOS: 9 days A FACE TO FACE EVALUATION WAS PERFORMED  Howard White 10/09/2020, 10:33 AM

## 2020-10-10 ENCOUNTER — Inpatient Hospital Stay (HOSPITAL_COMMUNITY): Payer: PPO | Admitting: Physical Therapy

## 2020-10-10 ENCOUNTER — Inpatient Hospital Stay (HOSPITAL_COMMUNITY): Payer: PPO | Admitting: Speech Pathology

## 2020-10-10 ENCOUNTER — Inpatient Hospital Stay (HOSPITAL_COMMUNITY): Payer: PPO | Admitting: Occupational Therapy

## 2020-10-10 DIAGNOSIS — I6381 Other cerebral infarction due to occlusion or stenosis of small artery: Secondary | ICD-10-CM

## 2020-10-10 DIAGNOSIS — R159 Full incontinence of feces: Secondary | ICD-10-CM

## 2020-10-10 DIAGNOSIS — R569 Unspecified convulsions: Secondary | ICD-10-CM

## 2020-10-10 DIAGNOSIS — D72829 Elevated white blood cell count, unspecified: Secondary | ICD-10-CM

## 2020-10-10 DIAGNOSIS — G8110 Spastic hemiplegia affecting unspecified side: Secondary | ICD-10-CM

## 2020-10-10 NOTE — Progress Notes (Signed)
Speech Language Pathology Daily Session Note  Patient Details  Name: Howard White MRN: [id number] Date of Birth: 1961/11/12  Today's Date: 10/10/2020 SLP Individual Time: 1405-1500 SLP Individual Time Calculation (min): 55 min  Short Term Goals: Week 2: SLP Short Term Goal 1 (Week 2): Patient will achieve 90% or greater speech intelligibility at conversational level with minA cues for speech strategy use SLP Short Term Goal 2 (Week 2): Patient will utilize external memory aides for orientation to time, recall of recent events (therapy sessions, etc) with minA to initiate use. SLP Short Term Goal 3 (Week 2): Patient will complete mild complex sequencing, organizing, categorizing tasks with 85% accuracy and minA cues. SLP Short Term Goal 4 (Week 2): Patient will demonstrate anticipatory awareness to deficits and impact on function as well as discharge plan (home with parents) with modA cues.  Skilled Therapeutic Interventions: Skilled treatment session focused on speech intelligibility goals. Upon arrival, patient reported that he is known as a good speaker and loves to talk to people. Patient reported that his main priority from speech therapy is to continue to work on articulation and rate with verbal expression. Therefore, SLP facilitated session by providing Min A verbal cues for patient to self-monitor and correct speech errors at the conversation level during an informal barrier task that also focused on spontaneous verbalizations. Visualization was also utilized in attempts to help control speech rate as patient usually verbalized too fast but then overcompensated by speaking too slowly. When he imagined he was speaking to his father (who is [facility]), his overall rate appeared to improve. Patient left upright in the wheelchair with alarm on and all needs within reach. Continue with current plan of care.      Pain Pain Assessment Pain Scale: 0-10 Pain Score: 0-No pain  Therapy/Group:  Individual Therapy  Aurea Aronov, [place] 10/10/2020, 3:13 PM

## 2020-10-10 NOTE — Progress Notes (Signed)
Occupational Therapy Session Note  Patient Details  Name: Howard White MRN: [id number] Date of Birth: April 23, 1961  Today's Date: 10/10/2020 OT Individual Time: 2563-8937 OT Individual Time Calculation (min): 61 min    Short Term Goals: Week 2:  OT Short Term Goal 1 (Week 2): Pt will complete UB dressing with min assist for two consecutive sessions. OT Short Term Goal 2 (Week 2): Pt will complete LB bathing with min assist sit to stand for two consecutive sessions. OT Short Term Goal 3 (Week 2): Pt will complete toilet transfers with mod assist stand pivot for two consecutive session. OT Short Term Goal 4 (Week 2): Pt will use the LUE for washing the RUE with mod facilitation for two consecutive sessions.  Skilled Therapeutic Interventions/Progress Updates:    Pt up in wheelchair to start session, with therapist transporting him down to the therapy gym.  He then completed stand pivot transfer to the mat with min assist.  He was able to also transition to supine with min assist as well.  Worked on LUE neuromuscular re-education during session with emphasis in supine on activation of the shoulder flexors, elbow extensors, and digit extensors.  Had pt work on holding a therapy ball with both hands at 90 degrees shoulder flexion and maintain digit and elbow extension.  Pt with decreased sustained attention as well as motor impersistence, letting his elbow flex and requiring mod assist and max instructional cueing to maintain.  Increased digit flexion tone noted with activity.  Transitioned to sitting with mod demonstrational cueing for technique and min assist.  He then worked on simple movements of shoulder flexion with elbow extension while avoiding shoulder abduction.  He was asked to push a washcloth along the bedside table without letting it go towards his belly.  Min assist needed.  Finished work concentrating on wrist and digit extension with hand on the table without overcompensation.  Mod assist  needed.  Finished session with transfer back to the wheelchair at min assist stand pivot and then back to the bed at the same level.  Call button and phone in reach with safety alarm in place.    Therapy Documentation Precautions:  Precautions Precautions: Fall Precaution Comments: [name], fluctuating tone in the LUE Restrictions Weight Bearing Restrictions: No  Pain: Pain Assessment Pain Scale: 0-10 Pain Score: 0-No pain ADL: See Care Tool Section for some details of mobility  Therapy/Group: Individual Therapy  Carsten Carstarphen OTR/L 10/10/2020, 12:24 PM

## 2020-10-10 NOTE — Progress Notes (Addendum)
[place] PHYSICAL MEDICINE & REHABILITATION PROGRESS NOTE   Subjective/Complaints: Patient seen transferring with therapy this morning.  States he slept well overnight.  He accurately states today [birth year].  He is in good spirits.  ROS: Denies CP, SOB, N/V/D  Objective:   No results found. No results for input(s): WBC, HGB, HCT, PLT in the last 72 hours. No results for input(s): NA, K, CL, CO2, GLUCOSE, BUN, CREATININE, CALCIUM in the last 72 hours.  Intake/Output Summary (Last 24 hours) at 10/10/2020 1433 Last data filed at 10/10/2020 1351 Gross per 24 hour  Intake 560 ml  Output 300 ml  Net 260 ml        Physical Exam: Vital Signs Blood pressure 107/88, pulse 84, temperature (!) 97.3 F (36.3 C), resp. rate 18, height 5\' 10"  (1.778 m), weight 84.4 kg, SpO2 94 %. Constitutional: No distress . Vital signs reviewed. HENT: Normocephalic.  Atraumatic. Eyes: EOMI. No discharge. Cardiovascular: No JVD.  RRR. Respiratory: Normal effort.  No stridor.  Bilateral clear to auscultation. GI: Non-distended.  BS +. Skin: Warm and dry.  Intact. Psych: Normal mood.  Normal behavior. Musc: No edema in extremities.  No tenderness in extremities. Neuro: Alert and oriented x3 Motor: RUE/RLE: 5/5 proximal distal LUE: Shoulder abduction, elbow flexion/extension 4-/5, handgrip 1/5 Left lower extremity: 4 -/5 proximal distal  Assessment/Plan: 1. Functional deficits which require 3+ hours per day of interdisciplinary therapy in a comprehensive inpatient rehab setting.  Physiatrist is providing close team supervision and 24 hour management of active medical problems listed below.  Physiatrist and rehab team continue to assess barriers to discharge/monitor patient progress toward functional and medical goals  Care Tool:  Bathing    Body parts bathed by patient: Chest, Abdomen, Right upper leg, Left upper leg, Right lower leg, Left lower leg, Face, Front perineal area, Right arm    Body parts bathed by helper: Buttocks, Left arm     Bathing assist Assist Level: Moderate Assistance - Patient 50 - 74%     Upper Body Dressing/Undressing Upper body dressing   What is the patient wearing?: Pull over shirt    Upper body assist Assist Level: Maximal Assistance - Patient 25 - 49%    Lower Body Dressing/Undressing Lower body dressing      What is the patient wearing?: Incontinence brief, Pants     Lower body assist Assist for lower body dressing: Moderate Assistance - Patient 50 - 74%     Toileting Toileting    Toileting assist Assist for toileting: Moderate Assistance - Patient 50 - 74%     Transfers Chair/bed transfer  Transfers assist     Chair/bed transfer assist level: Minimal Assistance - Patient > 75%     Locomotion Ambulation   Ambulation assist      Assist level: 2 helpers Assistive device: Lite Gait Max distance: 150   Walk 10 feet activity   Assist     Assist level: 2 helpers Assistive device: Lite Gait   Walk 50 feet activity   Assist Walk 50 feet with 2 turns activity did not occur: Safety/medical concerns  Assist level: 2 helpers Assistive device: Lite Gait    Walk 150 feet activity   Assist Walk 150 feet activity did not occur: Safety/medical concerns  Assist level: 2 helpers Assistive device: Lite Gait    Walk 10 feet on uneven surface  activity   Assist Walk 10 feet on uneven surfaces activity did not occur: Safety/medical concerns  Wheelchair     Assist Will patient use wheelchair at discharge?: Yes Type of Wheelchair: Manual    Wheelchair assist level: Moderate Assistance - Patient 50 - 74% Max wheelchair distance: 100    Wheelchair 50 feet with 2 turns activity    Assist        Assist Level: Moderate Assistance - Patient 50 - 74%   Wheelchair 150 feet activity     Assist      Assist Level: Maximal Assistance - Patient 25 - 49%   Blood pressure 107/88,  pulse 84, temperature (!) 97.3 F (36.3 C), resp. rate 18, height 5\' 10"  (1.778 m), weight 84.4 kg, SpO2 94 %.    Medical Problem List and Plan: 1.  Left-sided hemiparesis, now with spasticity and facial droop secondary to acute infarct right basal ganglia/corona radiata on 09/26/2020 as well as history of astrocytoma with left frontal craniotomy resection 20 years ago  Continue CIR  WHO nightly   Trial baclofen 5mg  TID. 2.  Antithrombotics: -DVT/anticoagulation: SCDs             -antiplatelet therapy: Aspirin 81 mg daily and Plavix 75 mg daily x3 weeks then aspirin alone 3. Pain Management: Tylenol as needed.  4. Mood: Lexapro 10 mg daily.  Provide emotional support             -antipsychotic agents: N/A 5. Neuropsych: This patient is capable of making decisions on his own behalf. 6. Skin/Wound Care: Routine skin checks  Santyl to wound 7. Fluids/Electrolytes/Nutrition:  8.  Seizure disorder.    Continue Lamictal 225 mg daily and 300 mg nightly  No seizure from rehab admission-11/26 9.  Hyperlipidemia.  LDL 131 on 11/13. Continue Lipitor 10.  Hypothyroidism: Synthroid 11.  Leukocytosis:   WBCs 11.6 on 11/17, labs ordered for Monday  Afebrile 12.  Occ bladder incont but usually cont- denied problems at home PTA  Improving 13.  Bowel incont ~50% colace to 200mg  BID, pt feels bowels are moving better   Improving 14.  Sleep is poor cont melatonin increase to 6mg   Improving LOS: 10 days A FACE TO FACE EVALUATION WAS PERFORMED  Arriah Wadle Lorie Phenix 10/10/2020, 2:33 PM

## 2020-10-10 NOTE — Progress Notes (Signed)
Physical Therapy Session Note  Patient Details  Name: Howard White MRN: [id number] Date of Birth: [birth year]  Today's Date: 10/10/2020 PT Individual Time: 0254-2706 and 1620-1706 PT Individual Time Calculation (min): 44 min and 46 min  Short Term Goals: Week 2:  PT Short Term Goal 1 (Week 2): Pt will transfer to and from [facility] with mod assist consistently PT Short Term Goal 2 (Week 2): Pt will propell WC 160ft with min assist PT Short Term Goal 3 (Week 2): Pt will ambulater 69ft with mod assist and LRAD PT Short Term Goal 4 (Week 2): Patient will transition supine <> sit edge of bed with CGA and verbal cues as needed consistently  Skilled Therapeutic Interventions/Progress Updates:    Session 1: Pt received supine in bed and reports he was incontinent of his bladder. Pt agreeable to therapy session. Supine>sitting R EOB, using bedrails, with close supervision for safety. Sitting EOB with supervision donned B LE TEDs and tennis shoes max assist. Sit<>stands to/from EOB using R UE support on bedrail as needed with min assist for balance - standing with intermittent use of R UE support and min/mod assist for balance performed peri-care with set-up assist and donned LB clothing with max assist for time management. Sitting EOB doffed dirty shirt and donned clean with mod assist and max cuing for hemi-technique and incorporation of L UE into task. While standing performed L stand pivot transfer to w/c with mod assist for balance and max cuing for slow, controlled movement due to increased impulsivity and poor safety awareness. MD in/out for daily assessment. Sitting in w/c performed hand hygiene with mod assist and max cuing and facilitation for increased L UE incorporation into task then completed oral care with continued cuing and manual facilitation for L UE involvement for NMR. RN in/out for medication administration. Gait training ~132ft x2 using B HHA on 1st trial then 3 Musketeer support on 2nd trial  -+2 min progressed to +2 mod assist for balance - continues to require frequent cuing for increased trunk extension for upright posture - continues to require cuing and manual facilitation for R weight shift during R stance to promote increased L LE swing phase advancement as pt often catching foot on ground; during 2nd gait therapist able to provide her foot as external target for L LE step length to promote improved R weight shift and L LE advancement with significant improvement noted but did require a few standing rest breaks to "rest" and redirect pt's attention to task - pt had improved posture and weight shift with 3 Musketeer support compared to B HHA. At end of session pt left seated in w/c with needs in reach, seat belt alarm on, and L UE support on 1/2 lap tray.  Session 2: Pt received supine in bed and agreeable to therapy session. Supine>sitting R EOB, HOB partially elevated with close supervision/CGA for safety - cuing for L hemibody management to increase independence - continues to demo L LE extensor tone during this task. Sitting EOB, supervision sitting balance, donned shoes max assist for time management. L squat pivot transfer EOB>w/c with mod assist for pivoting hips and balance - max cuing for sequencing and to perform slow, controlled movements due to continued impaired safety awareness and impulsivity though improving.  Transported to/from gym in w/c for time management and energy conservation. Gait training ~178ft x2 using 3 Musketeer support then transitioned to R HHA and L UE around therapist's shoulders in an effort to decrease reliance on R UE  support and improve trunk posture as pt  continues to demo R lateral trunk flexion - +2 min/mod assist for balance - demos decreased L LE hip/knee flexion during swing therefore provided cuing for pt to "kick his butt" then "land with his heel" with pt demonstrating improved activation and motor planning to recruit flexors during swing but has to  verbalize these cues outloud to himself to improve his focus and motor planning - decreased safety awareness when turning with pt forgetting to step L LE requiring cuing for L attention and improved awareness of LOB. Pt reports sudden urgency to use bathroom. Transported back to room in w/c. Sit<>stands, no AD, with min assist for balance throughout session with cuing for pt to focus/attend to task to improve balance. Standing with L UE support around therapist's shoulders performed LB clothing with min assist and held urinal with set-up assist - continent of bladder. Seated hand hygiene, max assist L UE incorporation into task. Transported back to gym. L LE NMR and dynamic standing balance task via L LE foot taps on/off 6" step with only L UE support around therapist - +2 present for safety - mirror feedback for improved upright posture - mod assist for balance and pt demoing improved trunk midline posture (less R lateral flexion) - able to place L LE on/off step without assist but cuing for improved motor recruitment. Gait training ~87ft using L UE support around therapist's shoulders, no R UE support but close +2 assist for safety, with mod assist and a few instances of max assist when pt turning and not stepping L LE as described above - demos improved upright posture and continues to provide himself max cuing for L LE swing phase sequencing.  Transported back to room in w/c and left sitting with needs in reach, L UE supported on 1/2 lap tray, and seat belt alarm on.  Therapy Documentation Precautions:  Precautions Precautions: Fall Precaution Comments: [name], fluctuating tone in the LUE Restrictions Weight Bearing Restrictions: No  Pain:   Session 1: No reports of pain throughout session.  Session 2: No reports of pain throughout session.   Therapy/Group: Individual Therapy  Tawana Scale , PT, DPT, CSRS  10/10/2020, 7:42 AM

## 2020-10-11 ENCOUNTER — Inpatient Hospital Stay (HOSPITAL_COMMUNITY): Payer: PPO | Admitting: Occupational Therapy

## 2020-10-11 ENCOUNTER — Inpatient Hospital Stay (HOSPITAL_COMMUNITY): Payer: PPO

## 2020-10-11 ENCOUNTER — Inpatient Hospital Stay (HOSPITAL_COMMUNITY): Payer: PPO | Admitting: Physical Therapy

## 2020-10-11 ENCOUNTER — Inpatient Hospital Stay (HOSPITAL_COMMUNITY): Payer: PPO | Admitting: Speech Pathology

## 2020-10-11 NOTE — Progress Notes (Signed)
Physical Therapy Session Note  Patient Details  Name: Howard White MRN: [id number] Date of Birth: 04-06-1961  Today's Date: 10/11/2020 PT Individual Time: 1425-1541 PT Individual Time Calculation (min): 76 min   Short Term Goals: Week 2:  PT Short Term Goal 1 (Week 2): Pt will transfer to and from [facility] with mod assist consistently PT Short Term Goal 2 (Week 2): Pt will propell WC 162ft with min assist PT Short Term Goal 3 (Week 2): Pt will ambulater 6ft with mod assist and LRAD PT Short Term Goal 4 (Week 2): Patient will transition supine <> sit edge of bed with CGA and verbal cues as needed consistently  Skilled Therapeutic Interventions/Progress Updates:    Pt received supine in bed with NT present and pt/NT reporting pt had fallen asleep in w/c and had just been assisted back to bed. Despite this, pt extremely motivated to participate in therapy session. Supine>sitting R EOB, HOB partially elevated, with CGA for trunk control and cuing for sequencing to increase pt independence and safety - continues to demo L LE extensor tone during transition but demos automatic and increased incorporation of L UE into task. L squat/stand pivot EOB>w/c with min/mod assist for balance while pivoting hips - doesn't come to full stand in order to take a step with L LE but doesn't stay in a full squat position either. Reports need to urinate. Sit>stand w/c>no UE support with min assist for balance - standing with CGA/min assist for balance while performing LB clothing and urinal management without assist then standing hand hygiene at sink with cuing for L UE incorporation. Transported to/from gym in w/c for time management and energy conservation. Block practice transfers w/c<>EOM via squat/stand pivot targeting improved sequencing to increase pt independence and safety with transfers - initially transferring to R pt leads with his head placing R hand on the mat as opposed to pivoting hips towards mat requiring  cuing for safety and correction - transferring to L pt doesn't move L LE out laterally enough to provide improved BOS. Gait training ~150ft, ~17ft using L UE support around therapist shoulders and mod assist of 1 - continues to demo lack of L LE hip/knee flexion during swing causing decreased foot clearance and step length/advancement, pt compensates via L hip hiking and R lateral trunk flexion - attempted ~33ft of gait at end of 2nd walk with L UE not around therapist's shoulders to see if that was promoting increased R lateral trunk flexion but this resulted in increased postural sway. Sitting on EOM attempted reciprocal R/L scooting targeting improved pelvic/trunk disassociation for improved motor control and muscle activation - pt unable to perform despite max multimodal cuing as pt consistently compensated via scooting his hips back by pushing through R LE - transitioned to focusing on just R/L lateral lean/weight shift to perform alternating pelvic elevation with pt able to perform each side independently but when trying to perform them in an alternating pattern pt with more impaired motor planning. Transitioned to focusing solely on elevating L pelvis to reach under hip and grab a car with L UE for NMR - supervision for sitting balance and mod assist for L UE grasp. Sitting in w/c>tall kneeling on mat table with B UE support on bench and mod assist of 1 - in tall kneeling, performed R lateral and superior reaching task focusing on R trunk elongation and balance with pt able to perform with min assist demonstrating significant improvement compared to last time with this therapist - progressed  to tall kneeling squats x 10 reps without UE support focusing on trunk control/balance and hip strengthening - pt also with significantly less L UE flexor tone compared to previous time in this position. Tall kneeling>quadruped>R sidelying on mat with min/mod assist. L stand pivot EOM>w/c with mod assist for balance.  Transported back to room and pt requesting for assistance back to bed. R squat/stand pivot w/c>EOB with mod assist. Sit>supine with supervision using bedrail support as needed. Pt left supine in bed with needs in reach and bed alarm on.   Therapy Documentation Precautions:  Precautions Precautions: Fall Precaution Comments: [name], fluctuating tone in the LUE Restrictions Weight Bearing Restrictions: No  Pain:   No reports of pain throughout session.   Therapy/Group: Individual Therapy  Tawana Scale , PT, DPT, CSRS  10/11/2020, 3:49 PM

## 2020-10-11 NOTE — Progress Notes (Signed)
Physical Therapy Session Note  Patient Details  Name: Howard White MRN: [id number] Date of Birth: 1961-08-20  Today's Date: 10/11/2020 PT Individual Time: 0900-0958 PT Individual Time Calculation (min): 58 min   Short Term Goals: Week 2:  PT Short Term Goal 1 (Week 2): Pt will transfer to and from [facility] with mod assist consistently PT Short Term Goal 2 (Week 2): Pt will propell WC 116ft with min assist PT Short Term Goal 3 (Week 2): Pt will ambulater 29ft with mod assist and LRAD PT Short Term Goal 4 (Week 2): Patient will transition supine <> sit edge of bed with CGA and verbal cues as needed consistently  Skilled Therapeutic Interventions/Progress Updates:    Pt greeted supine in bed, awake and agreebale to PT session. He reports no pain but he does report that he accidentally spilt his urinal while voiding in bed. Required modA for removing dirty briefs and able to perform pericare with setupA. Donned new brief with totalA for time management, able to roll L<>R with supervision with HOB flat and use of bedrail. Donned boxers and scrub pants with modA for threading while supine in bed. Supine<>sit with CGA and able to sit EOB with supervision, unsupported. Performed stand<>pivot transfer with minA from EOB to w/c, cues for safety approach, sequencing, and awaiting therapist instructions for initiating mobility due to mild impulsivity. Wheeled sinkside as pt requests to brush teeth and wash his face. He completed this with setupA, using RUE only. Transported pt in w/c for time management to main therapy gym and performed stand<>pivot with minA from w/c to mat table with similar cues as provided above. Focused on BLE coordination in standing with 3inch platform, performing unilateral stepping with both LLE and RLE (seated rest breaks), requiring min/modA for standing balance. Provided mirror for visual feedback to excessive R lateral and forward flexed trunk during stepping patterns, which he was able  to correct. He also ambulated ~44ft with modA and no AD (2nd person providing supervision for safety), gait deficits include BLE ataxia, decreased L swing/foot clearance, forward flexed trunk, decreased L step length. Therapist cueing for postural awareness with trunk extension, L stepping pattern (increasing L heel strike and knee flexion). He also performed gait training at hand rail with side stepping L<>R, 33ft, with min/modA for balance. While side-stepping, demo's decreased efficiency with steps, narrow BOS, and decreased foot clearance. Cues for normalizing stepping pattern and he required a few brief standing rest breaks due to fatigue. He was returned to his room where he remained seated in w/c with needs in reach, safety belt alarm on.   Therapy Documentation Precautions:  Precautions Precautions: Fall Precaution Comments: [name], fluctuating tone in the LUE Restrictions Weight Bearing Restrictions: No  Therapy/Group: Individual Therapy  Kayzlee Wirtanen P Miquel Stacks  PT 10/11/2020, 10:00 AM

## 2020-10-11 NOTE — Progress Notes (Signed)
[facility] PHYSICAL MEDICINE & REHABILITATION PROGRESS NOTE   Subjective/Complaints: Patient seen sitting up in bed this morning.  He states he slept well overnight.  He asks for Tylenol.  ROS: Denies CP, SOB, N/V/D  Objective:   No results found. No results for input(s): WBC, HGB, HCT, PLT in the last 72 hours. No results for input(s): NA, K, CL, CO2, GLUCOSE, BUN, CREATININE, CALCIUM in the last 72 hours.  Intake/Output Summary (Last 24 hours) at 10/11/2020 1312 Last data filed at 10/11/2020 1240 Gross per 24 hour  Intake 800 ml  Output 375 ml  Net 425 ml        Physical Exam: Vital Signs Blood pressure 117/73, pulse (!) 52, temperature 97.6 F (36.4 C), temperature source Oral, resp. rate 18, height 5\' 10"  (1.778 m), weight 84.4 kg, SpO2 96 %. Constitutional: No distress . Vital signs reviewed. HENT: Normocephalic.  Atraumatic. Eyes: EOMI. No discharge. Cardiovascular: No JVD.  RRR. Respiratory: Normal effort.  No stridor.  Bilateral clear to auscultation. GI: Non-distended.  BS +. Skin: Warm and dry.  Intact. Psych: Normal mood.  Normal behavior. Musc: No edema in extremities.  No tenderness in extremities. Neuro: Alert Motor: RUE/RLE: 5/5 proximal distal LUE: Shoulder abduction, elbow flexion/extension 4-/5, handgrip 2+/5 with apraxia Left lower extremity: 4 -/5 proximal distal  Assessment/Plan: 1. Functional deficits which require 3+ hours per day of interdisciplinary therapy in a comprehensive inpatient rehab setting.  Physiatrist is providing close team supervision and 24 hour management of active medical problems listed below.  Physiatrist and rehab team continue to assess barriers to discharge/monitor patient progress toward functional and medical goals  Care Tool:  Bathing    Body parts bathed by patient: Chest, Abdomen, Right upper leg, Left upper leg, Right lower leg, Left lower leg, Face, Front perineal area, Right arm   Body parts bathed by  helper: Buttocks, Left arm     Bathing assist Assist Level: Moderate Assistance - Patient 50 - 74%     Upper Body Dressing/Undressing Upper body dressing   What is the patient wearing?: Pull over shirt    Upper body assist Assist Level: Maximal Assistance - Patient 25 - 49%    Lower Body Dressing/Undressing Lower body dressing      What is the patient wearing?: Incontinence brief, Pants     Lower body assist Assist for lower body dressing: Moderate Assistance - Patient 50 - 74%     Toileting Toileting    Toileting assist Assist for toileting: Moderate Assistance - Patient 50 - 74%     Transfers Chair/bed transfer  Transfers assist     Chair/bed transfer assist level: Moderate Assistance - Patient 50 - 74%     Locomotion Ambulation   Ambulation assist      Assist level: Maximal Assistance - Patient 25 - 49% Assistive device: Other (comment) (L UE support around therapist's shoulders) Max distance: 66ft   Walk 10 feet activity   Assist     Assist level: Moderate Assistance - Patient - 50 - 74% Assistive device: Other (comment) (L UE support around therapist's shoulders)   Walk 50 feet activity   Assist Walk 50 feet with 2 turns activity did not occur: Safety/medical concerns  Assist level: Maximal Assistance - Patient 25 - 49% Assistive device: Other (comment) (L UE support around therapist's shoulders)    Walk 150 feet activity   Assist Walk 150 feet activity did not occur: Safety/medical concerns  Assist level: 2 helpers Assistive device: Lite  Gait    Walk 10 feet on uneven surface  activity   Assist Walk 10 feet on uneven surfaces activity did not occur: Safety/medical concerns         Wheelchair     Assist Will patient use wheelchair at discharge?: Yes Type of Wheelchair: Manual    Wheelchair assist level: Moderate Assistance - Patient 50 - 74% Max wheelchair distance: 100    Wheelchair 50 feet with 2 turns  activity    Assist        Assist Level: Moderate Assistance - Patient 50 - 74%   Wheelchair 150 feet activity     Assist      Assist Level: Maximal Assistance - Patient 25 - 49%   Blood pressure 117/73, pulse (!) 52, temperature 97.6 F (36.4 C), temperature source Oral, resp. rate 18, height 5\' 10"  (1.778 m), weight 84.4 kg, SpO2 96 %.    Medical Problem List and Plan: 1.  Left-sided hemiparesis, now with spasticity and facial droop secondary to acute infarct right basal ganglia/corona radiata on 09/26/2020 as well as history of astrocytoma with left frontal craniotomy resection 20 years ago  Continue CIR  WHO nightly   Baclofen 5mg  TID. 2.  Antithrombotics: -DVT/anticoagulation: SCDs             -antiplatelet therapy: Aspirin 81 mg daily and Plavix 75 mg daily x3 weeks then aspirin alone 3. Pain Management: Tylenol as needed.  4. Mood: Lexapro 10 mg daily.  Provide emotional support             -antipsychotic agents: N/A 5. Neuropsych: This patient is capable of making decisions on his own behalf. 6. Skin/Wound Care: Routine skin checks  Santyl to wound 7. Fluids/Electrolytes/Nutrition:  8.  Seizure disorder.    Continue Lamictal 225 mg daily and 300 mg nightly  No seizure from rehab admission-11/27 9.  Hyperlipidemia.  LDL 131 on 11/13. Continue Lipitor 10. Hypothyroidism: Synthroid 11.  Leukocytosis:   WBCs 11.6 on 11/17, labs ordered for Monday  Afebrile 12.  Bladder incontinence improved 13.  Bowel incont improved  Will consider medications tomorrow if no bowel movement today 14.  Sleep is poor cont melatonin increase to 6mg   Improved LOS: 11 days A FACE TO FACE EVALUATION WAS PERFORMED  Adel Burch Lorie Phenix 10/11/2020, 1:12 PM

## 2020-10-11 NOTE — Progress Notes (Signed)
Occupational Therapy Session Note  Patient Details  Name: Howard White MRN: [id number] Date of Birth: [birth year]  Today's Date: 10/11/2020 OT Individual Time: 7121-9758 OT Individual Time Calculation (min): 61 min    Short Term Goals: Week 2:  OT Short Term Goal 1 (Week 2): Pt will complete UB dressing with min assist for two consecutive sessions. OT Short Term Goal 2 (Week 2): Pt will complete LB bathing with min assist sit to stand for two consecutive sessions. OT Short Term Goal 3 (Week 2): Pt will complete toilet transfers with mod assist stand pivot for two consecutive session. OT Short Term Goal 4 (Week 2): Pt will use the LUE for washing the RUE with mod facilitation for two consecutive sessions.  Skilled Therapeutic Interventions/Progress Updates:    Pt began session with ambulation to the bathroom with mod assist and max demonstrational cueing for step sequencing and maintaining upright posture.  He tends to flex his trunk and head forward as well as to the right side at times during mobility.  Decreased sequencing is also noted.  Once he finished toileting at mod assist sit to stand, he ambulated back out to the wheelchair at the same level and worked on washing his hands in sitting with min assist.  Therapist pushed him to the dayroom where he worked in quadriped to start in order to increased weightbearing and activation in the left arm.  Hand paddle was used to assist with digit extension while his hand was positioned on the mat.  He was able to maintain elbow extension while stabilizing with the left arm and reaching for clothespins with the right.  Min to mod facilitation needed to maintain elbow extension however secondary to motor impersistence.  Transitioned to sitting where the paddle was removed and use of the UE Ranger was integrated.  Focused on movements of elbow extension with horizontal abduction and external rotation.  He was able to move his hand to target with min assist  but needs max demonstrational cueing to maintain anterior pelvic tilt, cervical extension to neutral, and to avoid flexing and overcompensation with the non-involved RUE.  Finished task with work on reaching to knee level and picking up paper towels balled up and placing in the trashcan.  He needed mod facilitation to try and move slowly and avoid overcompensation which results in increased tone in the chest and elbow.  Returned to the wheelchair with min assist stand pivot and then back to the room where he was left up in the wheelchair with the call button and phone in reach and safety belt in place.  Encourage pt to work on Museum/gallery curator and digit extension AAROM with his hand on the half lap tray.    Therapy Documentation Precautions:  Precautions Precautions: Fall Precaution Comments: [name], fluctuating tone in the LUE Restrictions Weight Bearing Restrictions: No  Pain: Pain Assessment Pain Scale: Faces Pain Score: 0-No pain ADL: See Care Tool Section for some details of mobility and selfcare  Therapy/Group: Individual Therapy  Vennie Waymire OTR/L 10/11/2020, 12:43 PM

## 2020-10-12 DIAGNOSIS — K5901 Slow transit constipation: Secondary | ICD-10-CM

## 2020-10-12 DIAGNOSIS — G479 Sleep disorder, unspecified: Secondary | ICD-10-CM

## 2020-10-12 MED ORDER — POLYETHYLENE GLYCOL 3350 17 G PO PACK
17.0000 g | PACK | Freq: Every day | ORAL | Status: DC
  Administered 2020-10-12 – 2020-10-28 (×17): 17 g via ORAL
  Filled 2020-10-12 (×18): qty 1

## 2020-10-12 NOTE — Progress Notes (Signed)
Pt slept for 8 hour st this shift. Given prn trazodone per request-good effects noted.

## 2020-10-12 NOTE — Progress Notes (Signed)
[place] PHYSICAL MEDICINE & REHABILITATION PROGRESS NOTE   Subjective/Complaints: Patient seen sitting up in bed this AM.  He states he slept well overnight.  He is happy to hear about his day of rest today.  ROS: Denies CP, SOB, N/V/D  Objective:   No results found. No results for input(s): WBC, HGB, HCT, PLT in the last 72 hours. No results for input(s): NA, K, CL, CO2, GLUCOSE, BUN, CREATININE, CALCIUM in the last 72 hours.  Intake/Output Summary (Last 24 hours) at 10/12/2020 1314 Last data filed at 10/12/2020 1304 Gross per 24 hour  Intake 760 ml  Output 1175 ml  Net -415 ml        Physical Exam: Vital Signs Blood pressure 123/78, pulse (!) 55, temperature 97.7 F (36.5 C), resp. rate 16, height 5\' 10"  (1.778 m), weight 84.4 kg, SpO2 96 %.  Constitutional: No distress . Vital signs reviewed. HENT: Normocephalic.  Atraumatic. Eyes: EOMI. No discharge. Cardiovascular: No JVD.  RRR. Respiratory: Normal effort.  No stridor.  Bilateral clear to auscultation. GI: Non-distended.  BS +. Skin: Warm and dry.  Intact. Psych: Normal mood.  Normal behavior. Musc: No edema in extremities.  No tenderness in extremities. Neuro: Alert Motor: RUE/RLE: 5/5 proximal distal LUE: Shoulder abduction, elbow flexion/extension 4-/5, handgrip 3/5 with apraxia  Left lower extremity: 4 -/5 proximal distal  Assessment/Plan: 1. Functional deficits which require 3+ hours per day of interdisciplinary therapy in a comprehensive inpatient rehab setting.  Physiatrist is providing close team supervision and 24 hour management of active medical problems listed below.  Physiatrist and rehab team continue to assess barriers to discharge/monitor patient progress toward functional and medical goals  Care Tool:  Bathing    Body parts bathed by patient: Chest, Abdomen, Right upper leg, Left upper leg, Right lower leg, Left lower leg, Face, Front perineal area, Right arm   Body parts bathed by  helper: Buttocks, Left arm     Bathing assist Assist Level: Moderate Assistance - Patient 50 - 74%     Upper Body Dressing/Undressing Upper body dressing   What is the patient wearing?: Pull over shirt    Upper body assist Assist Level: Maximal Assistance - Patient 25 - 49%    Lower Body Dressing/Undressing Lower body dressing      What is the patient wearing?: Incontinence brief, Pants     Lower body assist Assist for lower body dressing: Moderate Assistance - Patient 50 - 74%     Toileting Toileting    Toileting assist Assist for toileting: Moderate Assistance - Patient 50 - 74%     Transfers Chair/bed transfer  Transfers assist     Chair/bed transfer assist level: Moderate Assistance - Patient 50 - 74%     Locomotion Ambulation   Ambulation assist      Assist level: Moderate Assistance - Patient 50 - 74% Assistive device: Other (comment) (L UE support around therapist's shoulders) Max distance: 116ft   Walk 10 feet activity   Assist     Assist level: Moderate Assistance - Patient - 50 - 74% Assistive device: Other (comment) (L UE support around therapist's shoulders)   Walk 50 feet activity   Assist Walk 50 feet with 2 turns activity did not occur: Safety/medical concerns  Assist level: Moderate Assistance - Patient - 50 - 74% Assistive device: Other (comment) (L UE support around therapist's shoulders)    Walk 150 feet activity   Assist Walk 150 feet activity did not occur: Safety/medical concerns  Assist level: 2 helpers Assistive device: Lite Gait    Walk 10 feet on uneven surface  activity   Assist Walk 10 feet on uneven surfaces activity did not occur: Safety/medical concerns         Wheelchair     Assist Will patient use wheelchair at discharge?: Yes Type of Wheelchair: Manual    Wheelchair assist level: Moderate Assistance - Patient 50 - 74% Max wheelchair distance: 100    Wheelchair 50 feet with 2 turns  activity    Assist        Assist Level: Moderate Assistance - Patient 50 - 74%   Wheelchair 150 feet activity     Assist      Assist Level: Maximal Assistance - Patient 25 - 49%   Blood pressure 123/78, pulse (!) 55, temperature 97.7 F (36.5 C), resp. rate 16, height 5\' 10"  (1.778 m), weight 84.4 kg, SpO2 96 %.    Medical Problem List and Plan: 1.  Left-sided hemiparesis, now with spasticity and facial droop secondary to acute infarct right basal ganglia/corona radiata on 09/26/2020 as well as history of astrocytoma with left frontal craniotomy resection 20 years ago  Continue CIR  WHO nightly   Baclofen 5mg  TID. 2.  Antithrombotics: -DVT/anticoagulation: SCDs             -antiplatelet therapy: Aspirin 81 mg daily and Plavix 75 mg daily x3 weeks then aspirin alone 3. Pain Management: Tylenol as needed.  4. Mood: Lexapro 10 mg daily.  Provide emotional support             -antipsychotic agents: N/A 5. Neuropsych: This patient is capable of making decisions on his own behalf. 6. Skin/Wound Care: Routine skin checks  Santyl to wound 7. Fluids/Electrolytes/Nutrition:  8.  Seizure disorder.    Continue Lamictal 225 mg daily and 300 mg nightly  No seizure from rehab admission 11/28 9.  Hyperlipidemia.  LDL 131 on 11/13. Continue Lipitor 10. Hypothyroidism: Synthroid 11.  Leukocytosis:   WBCs 11.6 on 11/17, labs ordered for tomorrow  Afebrile 12.  Bladder incontinence improved 13.  Bowel incont improved  Bowel meds ordered for constipation on 11/28 14.  Sleep is poor cont melatonin increase to 6mg   Improved LOS: 12 days A FACE TO FACE EVALUATION WAS PERFORMED  Xitlally Mooneyham Lorie Phenix 10/12/2020, 1:14 PM

## 2020-10-13 ENCOUNTER — Inpatient Hospital Stay (HOSPITAL_COMMUNITY): Payer: PPO

## 2020-10-13 ENCOUNTER — Inpatient Hospital Stay (HOSPITAL_COMMUNITY): Payer: PPO | Admitting: Speech Pathology

## 2020-10-13 LAB — CBC WITH DIFFERENTIAL/PLATELET
Abs Immature Granulocytes: 0.03 10*3/uL (ref 0.00–0.07)
Basophils Absolute: 0 10*3/uL (ref 0.0–0.1)
Basophils Relative: 1 %
Eosinophils Absolute: 0.3 10*3/uL (ref 0.0–0.5)
Eosinophils Relative: 4 %
HCT: 47.4 % (ref 39.0–52.0)
Hemoglobin: 15.8 g/dL (ref 13.0–17.0)
Immature Granulocytes: 1 %
Lymphocytes Relative: 22 %
Lymphs Abs: 1.5 10*3/uL (ref 0.7–4.0)
MCH: 31.1 pg (ref 26.0–34.0)
MCHC: 33.3 g/dL (ref 30.0–36.0)
MCV: 93.3 fL (ref 80.0–100.0)
Monocytes Absolute: 0.7 10*3/uL (ref 0.1–1.0)
Monocytes Relative: 11 %
Neutro Abs: 4 10*3/uL (ref 1.7–7.7)
Neutrophils Relative %: 61 %
Platelets: 327 10*3/uL (ref 150–400)
RBC: 5.08 MIL/uL (ref 4.22–5.81)
RDW: 12.1 % (ref 11.5–15.5)
WBC: 6.5 10*3/uL (ref 4.0–10.5)
nRBC: 0 % (ref 0.0–0.2)

## 2020-10-13 LAB — BASIC METABOLIC PANEL
Anion gap: 11 (ref 5–15)
BUN: 9 mg/dL (ref 6–20)
CO2: 25 mmol/L (ref 22–32)
Calcium: 9.6 mg/dL (ref 8.9–10.3)
Chloride: 102 mmol/L (ref 98–111)
Creatinine, Ser: 1.21 mg/dL (ref 0.61–1.24)
GFR, Estimated: >60 mL/min (ref >60)
Glucose, Bld: 116 mg/dL — ABNORMAL HIGH (ref 70–99)
Potassium: 3.9 mmol/L (ref 3.5–5.1)
Sodium: 138 mmol/L (ref 135–145)

## 2020-10-13 NOTE — Progress Notes (Signed)
Speech Language Pathology Daily Session Note  Patient Details  Name: Shamar Engelmann MRN: [id number] Date of Birth: 1961/10/31  Today's Date: 10/13/2020 SLP Individual Time: 1500-1530 SLP Individual Time Calculation (min): 30 min  Short Term Goals: Week 2: SLP Short Term Goal 1 (Week 2): Patient will achieve 90% or greater speech intelligibility at conversational level with minA cues for speech strategy use SLP Short Term Goal 2 (Week 2): Patient will utilize external memory aides for orientation to time, recall of recent events (therapy sessions, etc) with minA to initiate use. SLP Short Term Goal 3 (Week 2): Patient will complete mild complex sequencing, organizing, categorizing tasks with 85% accuracy and minA cues. SLP Short Term Goal 4 (Week 2): Patient will demonstrate anticipatory awareness to deficits and impact on function as well as discharge plan (home with parents) with modA cues.  Skilled Therapeutic Interventions:Skilled ST services focused on speech and cognitive skills. Pt initially demonstrated fast rate in informal conversation however was able to slow rate after a few exchanges. Pt recalled strategies mod I and demonstrated 90% intelligibility with min A verbal cues throughout the remainer of the session. SLP facilitated mildly complex problem solving in 4 step picture sequencing tasks, pt required min A verbal cues for problem solving and error awareness. Pt was left in room with call bell within reach and bed alarm set. SLP recommends to continue skilled services.     Pain Pain Assessment Pain Scale: 0-10 Pain Score: 7  Faces Pain Scale: No hurt Pain Type: Acute pain Pain Location: Back Pain Orientation: Lower Pain Descriptors / Indicators: Aching Pain Frequency: Occasional Pain Onset: Gradual Patients Stated Pain Goal: 0 Pain Intervention(s): Medication (See eMAR) Multiple Pain Sites: No  Therapy/Group: Individual Therapy  Brionne Mertz  [facility] 10/13/2020, 12:01  PM

## 2020-10-13 NOTE — Progress Notes (Signed)
Speech Language Pathology Daily Session Note  Patient Details  Name: Howard White MRN: [id number] Date of Birth: January 14, 1961  Today's Date: 10/13/2020 SLP Individual Time: 9794-8016 SLP Individual Time Calculation (min): 26 min  Short Term Goals: Week 2: SLP Short Term Goal 1 (Week 2): Patient will achieve 90% or greater speech intelligibility at conversational level with minA cues for speech strategy use SLP Short Term Goal 2 (Week 2): Patient will utilize external memory aides for orientation to time, recall of recent events (therapy sessions, etc) with minA to initiate use. SLP Short Term Goal 3 (Week 2): Patient will complete mild complex sequencing, organizing, categorizing tasks with 85% accuracy and minA cues. SLP Short Term Goal 4 (Week 2): Patient will demonstrate anticipatory awareness to deficits and impact on function as well as discharge plan (home with parents) with modA cues.  Skilled Therapeutic Interventions: Pt was seen for skilled ST targeting cognitive goals. SLP facilitated session with tasks to facilitate use of compensatory memory strategies of verbal repetition and visualization, which pt used with Min faded to Supervision A to recall details within picture scenes with ~90% accuracy. Pt use memory notebook to recall details from last ST session with a Min A verbal cue. Pt left laying in bed with alarm set and needs within reach. Continue per current plan of care.          Pain Pain Assessment Pain Scale: 0-10 Pain Score: 0-No pain  Therapy/Group: Individual Therapy  Arbutus Leas 10/13/2020, 7:59 AM

## 2020-10-13 NOTE — Progress Notes (Signed)
Physical Therapy Session Note  Patient Details  Name: Howard White MRN: [id number] Date of Birth: 1961/09/16  Today's Date: 10/13/2020 PT Individual Time: 4098-1191 PT Individual Time Calculation (min): 41 min   Short Term Goals: Week 2:  PT Short Term Goal 1 (Week 2): Pt will transfer to and from [facility] with mod assist consistently PT Short Term Goal 2 (Week 2): Pt will propell WC 139f with min assist PT Short Term Goal 3 (Week 2): Pt will ambulater 32fwith mod assist and LRAD PT Short Term Goal 4 (Week 2): Patient will transition supine <> sit edge of bed with CGA and verbal cues as needed consistently  Skilled Therapeutic Interventions/Progress Updates:     [facility] RN present in room providing tylenol for pt for pain L rib cage and low back. PT provides mobility to manage pain symptoms. [facility] and [facility]. Block training for squat pivot from WC<>mat going both toward L and R alternately. Pt has improved safety when transferring to the R but has difficulty utilizing head/hip relationship. Performs with CGA and cues on sequencing. Transferring to L pt frequently neglects to adjusts positioning of L leg and base of support is too narrow, causing decreased stability and pt demonstrating posterior leans upon transfer to mat.   Pt performs multiple reps of sit to stand, focusing on body mechanics and weight shifting to the L. PT provides minA/modA for transfer and mac verbal cues for sequencing. Pt instructed in hand-over-hand technique, pushing through L knee to approximate joint and provide proprioceptive feedback. Pt also performs targeted stepping with R leg, with PT providing modA at hips and trunk, as well as blocking L knee. Pt tends to flex forward at trunk with fatigue and has decreased weight shifting to the L. Pt performs sit to stand and standing balance activity with 5 inch platform placed under  R leg to limit ability to compensate. Pt still requires mac verbal cues and step-by step instructions, as he attempts to push primarily with R leg. With modA pt able to achieve standing and good WB through L leg.  Squat pivot back to WC with CGA. Left seated in WC with alarm intact, L arm tray in place, and all needs within reach.  Therapy Documentation Precautions:  Precautions Precautions: Fall Precaution Comments: [name], fluctuating tone in the LUE Restrictions Weight Bearing Restrictions: No    Therapy/Group: Individual Therapy  WiBreck CoonsPT, DPT 10/13/2020, 12:40 PM

## 2020-10-13 NOTE — Progress Notes (Signed)
Occupational Therapy Session Note  Patient Details  Name: Howard White MRN: [id number] Date of Birth: [birth year]  Today's Date: 10/13/2020 OT Individual Time: 1331-1355 OT Individual Time Calculation (min): 24 min    Short Term Goals: Week 2:  OT Short Term Goal 1 (Week 2): Pt will complete UB dressing with min assist for two consecutive sessions. OT Short Term Goal 2 (Week 2): Pt will complete LB bathing with min assist sit to stand for two consecutive sessions. OT Short Term Goal 3 (Week 2): Pt will complete toilet transfers with mod assist stand pivot for two consecutive session. OT Short Term Goal 4 (Week 2): Pt will use the LUE for washing the RUE with mod facilitation for two consecutive sessions.  Skilled Therapeutic Interventions/Progress Updates:   Pt received in w/c with seatbelt alarm engaged, agreeable to OT session. Session focus on LLE neuro re-ed to improve ADL/IADL and functional mobility performance.  Seated in w/c, pt performed ~20 forward towel slides on side table using BUE (targeting L shoulder flexion + L elbow extension/flexion) with mod A from therapist to inhibit L wrist flexion and to facilitate L elbow extension.Then completed ~15 reps of horizontal towel slides using LUE with mod A from therapist to facilitate L shoulder external rotation. Placed washcloth in L hand to decrease activity demands and to target shoulder external rotation. Edu pt on mental imagery and visual attention to target improved LLE motor performance, pt verbalized understanding. Pt cont to demonstrate great motivation to participate in therapy, but cont to req mod VCs to slow movements down 2/2 impulsiveness during functional mobility. Pt requested to return to bed at end of session. Req mod A to complete stand -pivot t/f to bed from w/c. 2x attempts as first attempt highly impulsive and unsafe. Pt boosted self in bed with min A to bridge BLE. Left pt semi-reclined in bed with bed alarm engaged,  call bell in reach, and all immediate needs met.  Therapy Documentation Precautions:  Precautions Precautions: Fall Precaution Comments: [name], fluctuating tone in the LUE Restrictions Weight Bearing Restrictions: No  Pain Assessment Pain Scale: 0-10 Pain Score:0- No pain  ADL: See Care Tool for more details.    Therapy/Group: Individual Therapy  Curtis Sites MS, OTR/L 10/13/2020, 3:10 PM

## 2020-10-13 NOTE — Progress Notes (Signed)
Occupational Therapy Session Note  Patient Details  Name: Howard White MRN: [id number] Date of Birth: 1961/08/04  Today's Date: 10/13/2020 OT Individual Time: 6629-4765 OT Individual Time Calculation (min): 69 min    Short Term Goals: Week 2:  OT Short Term Goal 1 (Week 2): Pt will complete UB dressing with min assist for two consecutive sessions. OT Short Term Goal 2 (Week 2): Pt will complete LB bathing with min assist sit to stand for two consecutive sessions. OT Short Term Goal 3 (Week 2): Pt will complete toilet transfers with mod assist stand pivot for two consecutive session. OT Short Term Goal 4 (Week 2): Pt will use the LUE for washing the RUE with mod facilitation for two consecutive sessions.  Skilled Therapeutic Interventions/Progress Updates:    Pt received semi-reclined in bed, agreeable to therapy. Session focus on full-body bathing, dressing, and grooming. Completed sup<>sit EOB with min A. Maintains static sitting balance EOB with close supervision due to impulsivity. Stand-pivot t/f from EOB to w/c with mod A to shift weight from RLE TO LLE. Cont to req max verbal and tactile cuing for L foot placement and to slow down movements 2/2 impulsivity. Transported pt in w/c to shower 2/2 time constraints and energy conservation. Completed stand-pivot t/f from w/c to shower chair with mod A. Overall completing full-body bathing with min A to wash buttocks. Pt able to place soap on wash cloth with supervision, but req mod A to complete sit<>stand using RW to wash buttocks. Additionally req total A for posterior peri-care hygiene after incontinent BM. Seated in w/c, req mod A to don brief/shorts with hemi-technique (to thread BLE and to pull over hips in standing). Donned shirt with max A + hemi-technique. Seated in w/c at sink, req max A to shave face with MD permission. Pt able to apply shaving cream to face with setup A, deferred pt shaving self this session 2/2 use of blood thinner  medication after discussion with MD. Left in w/c with seatbelt alarm engaged, call bell in reach, and all immediate needs met.    Therapy Documentation Precautions:  Precautions Precautions: Fall Precaution Comments: [name], fluctuating tone in the LUE Restrictions Weight Bearing Restrictions: No  Pain: Pain Assessment Pain Scale: 0-10 Pain Score: 0 - No pain  ADL: See care tool for more details.  Therapy/Group: Individual Therapy  Curtis Sites, OTR/L 10/13/2020, 12:12 PM

## 2020-10-13 NOTE — Progress Notes (Signed)
[place] PHYSICAL MEDICINE & REHABILITATION PROGRESS NOTE   Subjective/Complaints:  No issues overnite, pt enthusiatic this am  Discussed progress  ROS: Denies CP, SOB, N/V/D  Objective:   No results found. No results for input(s): WBC, HGB, HCT, PLT in the last 72 hours. No results for input(s): NA, K, CL, CO2, GLUCOSE, BUN, CREATININE, CALCIUM in the last 72 hours.  Intake/Output Summary (Last 24 hours) at 10/13/2020 0852 Last data filed at 10/13/2020 5284 Gross per 24 hour  Intake 480 ml  Output 800 ml  Net -320 ml        Physical Exam: Vital Signs Blood pressure (!) 119/92, pulse 61, temperature (!) 97.5 F (36.4 C), temperature source Oral, resp. rate 18, height 5\' 10"  (1.778 m), weight 84.4 kg, SpO2 97 %.   General: No acute distress Mood and affect are appropriate Heart: Regular rate and rhythm no rubs murmurs or extra sounds Lungs: Clear to auscultation, breathing unlabored, no rales or wheezes Abdomen: Positive bowel sounds, soft nontender to palpation, nondistended Extremities: No clubbing, cyanosis, or edema Skin: No evidence of breakdown, no evidence of rash   Musc: No edema in extremities.  No tenderness in extremities. Neuro: Alert Motor: RUE/RLE: 5/5 proximal distal LUE: Shoulder abduction, elbow flexion/extension 4-/5, handgrip 4-/5 with flexor syndergy [place] 4 Left lower extremity: 4 -/5 proximal distal  Assessment/Plan: 1. Functional deficits which require 3+ hours per day of interdisciplinary therapy in a comprehensive inpatient rehab setting.  Physiatrist is providing close team supervision and 24 hour management of active medical problems listed below.  Physiatrist and rehab team continue to assess barriers to discharge/monitor patient progress toward functional and medical goals  Care Tool:  Bathing    Body parts bathed by patient: Chest, Abdomen, Right upper leg, Left upper leg, Right lower leg, Left lower leg, Face, Front  perineal area, Right arm   Body parts bathed by helper: Buttocks, Left arm     Bathing assist Assist Level: Moderate Assistance - Patient 50 - 74%     Upper Body Dressing/Undressing Upper body dressing   What is the patient wearing?: Pull over shirt    Upper body assist Assist Level: Maximal Assistance - Patient 25 - 49%    Lower Body Dressing/Undressing Lower body dressing      What is the patient wearing?: Incontinence brief, Pants     Lower body assist Assist for lower body dressing: Moderate Assistance - Patient 50 - 74%     Toileting Toileting    Toileting assist Assist for toileting: Moderate Assistance - Patient 50 - 74%     Transfers Chair/bed transfer  Transfers assist     Chair/bed transfer assist level: Moderate Assistance - Patient 50 - 74%     Locomotion Ambulation   Ambulation assist      Assist level: Moderate Assistance - Patient 50 - 74% Assistive device: Other (comment) (L UE support around therapist's shoulders) Max distance: 123ft   Walk 10 feet activity   Assist     Assist level: Moderate Assistance - Patient - 50 - 74% Assistive device: Other (comment) (L UE support around therapist's shoulders)   Walk 50 feet activity   Assist Walk 50 feet with 2 turns activity did not occur: Safety/medical concerns  Assist level: Moderate Assistance - Patient - 50 - 74% Assistive device: Other (comment) (L UE support around therapist's shoulders)    Walk 150 feet activity   Assist Walk 150 feet activity did not occur: Safety/medical concerns  Assist  level: 2 helpers Assistive device: Lite Gait    Walk 10 feet on uneven surface  activity   Assist Walk 10 feet on uneven surfaces activity did not occur: Safety/medical concerns         Wheelchair     Assist Will patient use wheelchair at discharge?: Yes Type of Wheelchair: Manual    Wheelchair assist level: Moderate Assistance - Patient 50 - 74% Max wheelchair  distance: 100    Wheelchair 50 feet with 2 turns activity    Assist        Assist Level: Moderate Assistance - Patient 50 - 74%   Wheelchair 150 feet activity     Assist      Assist Level: Maximal Assistance - Patient 25 - 49%   Blood pressure (!) 119/92, pulse 61, temperature (!) 97.5 F (36.4 C), temperature source Oral, resp. rate 18, height 5\' 10"  (1.778 m), weight 84.4 kg, SpO2 97 %.    Medical Problem List and Plan: 1.  Left-sided hemiparesis, now with spasticity and facial droop secondary to acute infarct right basal ganglia/corona radiata on 09/26/2020 as well as history of astrocytoma with left frontal craniotomy resection 20 years ago  Continue CIR  WHO nightly   Baclofen 5mg  TID. 2.  Antithrombotics: -DVT/anticoagulation: SCDs             -antiplatelet therapy: Aspirin 81 mg daily and Plavix 75 mg daily x3 weeks then aspirin alone 3. Pain Management: Tylenol as needed.  4. Mood: Lexapro 10 mg daily.  Provide emotional support             -antipsychotic agents: N/A 5. Neuropsych: This patient is capable of making decisions on his own behalf. 6. Skin/Wound Care: Routine skin checks  Santyl to wound 7. Fluids/Electrolytes/Nutrition:  8.  Seizure disorder.    Continue Lamictal 225 mg daily and 300 mg nightly  No seizure from rehab admission 11/29 9.  Hyperlipidemia.  LDL 131 on 11/13. Continue Lipitor 10. Hypothyroidism: Synthroid 11.  Leukocytosis:   WBCs 11.6 on 11/17, labs ordered for tomorrow  Afebrile 12.  Bladder incontinence improved 13.  Bowel incont improved  Bowel meds ordered for constipation on 11/28 14.  Sleep is poor cont melatonin increase to 6mg   Improved LOS: 13 days A FACE TO FACE EVALUATION WAS PERFORMED  Charlett Blake 10/13/2020, 8:52 AM

## 2020-10-14 ENCOUNTER — Inpatient Hospital Stay (HOSPITAL_COMMUNITY): Payer: PPO | Admitting: Speech Pathology

## 2020-10-14 ENCOUNTER — Inpatient Hospital Stay (HOSPITAL_COMMUNITY): Payer: PPO | Admitting: Physical Therapy

## 2020-10-14 ENCOUNTER — Inpatient Hospital Stay (HOSPITAL_COMMUNITY): Payer: PPO | Admitting: Occupational Therapy

## 2020-10-14 ENCOUNTER — Inpatient Hospital Stay (HOSPITAL_COMMUNITY): Payer: PPO

## 2020-10-14 NOTE — Progress Notes (Signed)
Physical Therapy Session Note  Patient Details  Name: Howard White MRN: [id number] Date of Birth: 03-21-1961  Today's Date: 10/14/2020 PT Individual Time: 0807-0906 PT Individual Time Calculation (min): 59 min   Short Term Goals: Week 2:  PT Short Term Goal 1 (Week 2): Pt will transfer to and from [facility] with mod assist consistently PT Short Term Goal 2 (Week 2): Pt will propell WC 16ft with min assist PT Short Term Goal 3 (Week 2): Pt will ambulater 69ft with mod assist and LRAD PT Short Term Goal 4 (Week 2): Patient will transition supine <> sit edge of bed with CGA and verbal cues as needed consistently  Skilled Therapeutic Interventions/Progress Updates:    Pt received supine in bed and agreeable to therapy session. Supine>sitting R EOB, HOB slightly elevated and bedrails available, with close supervision for safety - requires increased time for trunk upright and scooting hips to EOB due to poor trunk/pelvis disassociation. Pt requesting to don clean brief. Sit>stand EOB>no AD with min assist for balance and pt noted to have small BM in brief. R stand pivot to w/c with min assist for balance. Transported into bathroom in w/c. L stand pivot w/c>toilet with min assist for balance. Pt unable to void further on toilet - donned clean LB clothing and shoes max assist. Sit>stand from toilet using intermittent R UE support on grab bar with min assist and standing with min/mod assist for balance performed peri-care with set-up assist - pt repeatedly placing dirty washcloths in toilet requiring max cuing to place them on the towel placed on the floor in front of him. Gait training ~43ft to sink with mod assist of 1 and +2 present for safety. Standing at sink with hips supported on counter and min assist for balance while performing hand hygiene.  Transported to/from gym in w/c for time management and energy conservation. Gait training ~22ft x2, no UE support with increased L UE flexor tone noted, - mod  assist of 1 and +2 providing close supervision for safety - mirror feedback provided for improved gait mechanics as pt continues to demo R lateral and anterior trunk flexion with poor L LE hip/knee flexor activation to clear food during swing. Transitioned to standing at stairs but using R HHA from +2 assist (had too much trunk flexion when holding onto handrail) while performing L LE foot taps to/from external targets on 1st step while having to clear foot over yoga block prior to placing foot on step to promote earlier activation of hamstrings - on 2nd set pt demos improved L LE motor planning while maintaining midline trunk posture (avoiding R lateral trunk flexion). Gait training ~47ft with R HHA of 2nd person and mod assist for balance with focus of carrying over improved upright/midline trunk alignment with increased L LE hip/knee flexor muscle activation - some improvement noted but would benefit from continued targeted interventions to address this trunk weakness and impaired dual-task abilities. Transported back to room and left seated in w/c with needs in reach, L UE supported on 1/2 lap tray, and seat belt alarm on.  Therapy Documentation Precautions:  Precautions Precautions: Fall Precaution Comments: [name], fluctuating tone in the LUE Restrictions Weight Bearing Restrictions: No  Pain:   No reports of pain throughout session.   Therapy/Group: Individual Therapy  Tawana Scale , PT, DPT, CSRS  10/14/2020, 7:58 AM

## 2020-10-14 NOTE — Progress Notes (Signed)
[facility] PHYSICAL MEDICINE & REHABILITATION PROGRESS NOTE   Subjective/Complaints:  Seen with PT, who notes some left sided inattention, no c/o this am .  Constipation managed with Miralax  ROS: Denies CP, SOB, N/V/D  Objective:   No results found. Recent Labs    10/13/20 0813  WBC 6.5  HGB 15.8  HCT 47.4  PLT 327   Recent Labs    10/13/20 0813  NA 138  K 3.9  CL 102  CO2 25  GLUCOSE 116*  BUN 9  CREATININE 1.21  CALCIUM 9.6    Intake/Output Summary (Last 24 hours) at 10/14/2020 0813 Last data filed at 10/14/2020 0657 Gross per 24 hour  Intake 720 ml  Output 950 ml  Net -230 ml        Physical Exam: Vital Signs Blood pressure 115/80, pulse (!) 53, temperature (!) 97.5 F (36.4 C), resp. rate 16, height 5\' 10"  (1.778 m), weight 84.4 kg, SpO2 95 %.   General: No acute distress Mood and affect are appropriate Heart: Regular rate and rhythm no rubs murmurs or extra sounds Lungs: Clear to auscultation, breathing unlabored, no rales or wheezes Abdomen: Positive bowel sounds, soft nontender to palpation, nondistended Extremities: No clubbing, cyanosis, or edema Skin: No evidence of breakdown, no evidence of rash   Musc: No edema in extremities.  No tenderness in extremities. Neuro: Alert Motor: RUE/RLE: 5/5 proximal distal LUE: Shoulder abduction, elbow flexion/extension 4-/5, handgrip 4-/5 with flexor syndergy Brunnstrom 4 Left lower extremity: 4 -/5 proximal distal  Assessment/Plan: 1. Functional deficits which require 3+ hours per day of interdisciplinary therapy in a comprehensive inpatient rehab setting.  Physiatrist is providing close team supervision and 24 hour management of active medical problems listed below.  Physiatrist and rehab team continue to assess barriers to discharge/monitor patient progress toward functional and medical goals  Care Tool:  Bathing    Body parts bathed by patient: Right arm, Face, Left arm, Chest, Abdomen,  Front perineal area, Right upper leg, Left upper leg, Right lower leg, Left lower leg   Body parts bathed by helper: Buttocks     Bathing assist Assist Level: Minimal Assistance - Patient > 75%     Upper Body Dressing/Undressing Upper body dressing   What is the patient wearing?: Pull over shirt    Upper body assist Assist Level: Maximal Assistance - Patient 25 - 49%    Lower Body Dressing/Undressing Lower body dressing      What is the patient wearing?: Incontinence brief, Pants     Lower body assist Assist for lower body dressing: Moderate Assistance - Patient 50 - 74%     Toileting Toileting    Toileting assist Assist for toileting: Maximal Assistance - Patient 25 - 49%     Transfers Chair/bed transfer  Transfers assist     Chair/bed transfer assist level: Contact Guard/Touching assist     Locomotion Ambulation   Ambulation assist      Assist level: Moderate Assistance - Patient 50 - 74% Assistive device: Other (comment) (L UE support around therapist's shoulders) Max distance: 189ft   Walk 10 feet activity   Assist     Assist level: Moderate Assistance - Patient - 50 - 74% Assistive device: Other (comment) (L UE support around therapist's shoulders)   Walk 50 feet activity   Assist Walk 50 feet with 2 turns activity did not occur: Safety/medical concerns  Assist level: Moderate Assistance - Patient - 50 - 74% Assistive device: Other (comment) (L UE  support around therapist's shoulders)    Walk 150 feet activity   Assist Walk 150 feet activity did not occur: Safety/medical concerns  Assist level: 2 helpers Assistive device: Lite Gait    Walk 10 feet on uneven surface  activity   Assist Walk 10 feet on uneven surfaces activity did not occur: Safety/medical concerns         Wheelchair     Assist Will patient use wheelchair at discharge?: Yes Type of Wheelchair: Manual    Wheelchair assist level: Moderate Assistance -  Patient 50 - 74% Max wheelchair distance: 100    Wheelchair 50 feet with 2 turns activity    Assist        Assist Level: Moderate Assistance - Patient 50 - 74%   Wheelchair 150 feet activity     Assist      Assist Level: Maximal Assistance - Patient 25 - 49%   Blood pressure 115/80, pulse (!) 53, temperature (!) 97.5 F (36.4 C), resp. rate 16, height 5\' 10"  (1.778 m), weight 84.4 kg, SpO2 95 %.    Medical Problem List and Plan: 1.  Left-sided hemiparesis, now with spasticity and facial droop secondary to acute infarct right basal ganglia/corona radiata on 09/26/2020 as well as history of astrocytoma with left frontal craniotomy resection 20 years ago  Continue CIR- team conf in am   WHO nightly   Baclofen 5mg  TID. 2.  Antithrombotics: -DVT/anticoagulation: SCDs             -antiplatelet therapy: Aspirin 81 mg daily and Plavix 75 mg daily x3 weeks then aspirin alone 3. Pain Management: Tylenol as needed.  4. Mood: Lexapro 10 mg daily.  Provide emotional support             -antipsychotic agents: N/A 5. Neuropsych: This patient is capable of making decisions on his own behalf. 6. Skin/Wound Care: Routine skin checks  Santyl to wound 7. Fluids/Electrolytes/Nutrition:  8.  Seizure disorder.    Continue Lamictal 225 mg daily and 300 mg nightly  No seizure since rehab admission  9.  Hyperlipidemia.  LDL 131 on 11/13. Continue Lipitor 10. Hypothyroidism: Synthroid 11.  Leukocytosis:   WBCs 11.6 on 11/17, labs ordered for tomorrow  Afebrile 12.  Bladder incontinence improved 13.  Bowel incont improved  Bowel meds ordered for constipation on 11/28 14.  Sleep is poor cont melatonin increase to 6mg   Improved LOS: 14 days A FACE TO FACE EVALUATION WAS PERFORMED  Charlett Blake 10/14/2020, 8:13 AM

## 2020-10-14 NOTE — Progress Notes (Signed)
Speech Language Pathology Daily Session Note  Patient Details  Name: Howard White MRN: [id number] Date of Birth: 1961-04-05  Today's Date: 10/14/2020 SLP Individual Time: 8719-5974 SLP Individual Time Calculation (min): 55 min  Short Term Goals: Week 2: SLP Short Term Goal 1 (Week 2): Patient will achieve 90% or greater speech intelligibility at conversational level with minA cues for speech strategy use SLP Short Term Goal 2 (Week 2): Patient will utilize external memory aides for orientation to time, recall of recent events (therapy sessions, etc) with minA to initiate use. SLP Short Term Goal 3 (Week 2): Patient will complete mild complex sequencing, organizing, categorizing tasks with 85% accuracy and minA cues. SLP Short Term Goal 4 (Week 2): Patient will demonstrate anticipatory awareness to deficits and impact on function as well as discharge plan (home with parents) with modA cues.  Skilled Therapeutic Interventions: Pt was seen for skilled ST targeting cognitive-linguistic goals. Pt required overall Min A verbal and visual cues for organization, problem solving, and use of strategies to increase emergent awareness and accuracy during a semi-complex monthly calendar/scheduling task. During a semi-complex deductive reasoning worksheet, he required increased Mod-Max A for interpreting complex language, more consistent Mod A for functional and verbal problem solving. Pt reported increasing mental fatigue as session progressed. His handwriting legibility also impacted some of his recall within tasks (SLP assisted with re-writing as necessary). Also discussed use of electronic calendar (phone) vs handwritten to compensate for decreased legibility at this time. Overall Min A verbal and visual cues provided for verbal recall and use of compensatory strategies for recall within session, such as memory notebook. Pt independently recalled his room number when navigating back to room from speech office.  Speech intelligibility decreased slightly during cognitively demanding tasks (~90%), requiring Supervision A verbal cueing for slower rate of speech and overarticulation. Pt left sitting in wheelchair with seatbelt alarm in place and turned on, call bell in hand, other needs within reach. Continue per current plan of care.          Pain Pain Assessment Pain Scale: 0-10 Pain Score: 0-No pain  Therapy/Group: Individual Therapy  Arbutus Leas 10/14/2020, 12:01 PM

## 2020-10-14 NOTE — Progress Notes (Signed)
Physical Therapy Session Note  Patient Details  Name: Howard White MRN: [id number] Date of Birth: [birth year]  Today's Date: 10/14/2020 PT Individual Time: 1000-1030 PT Individual Time Calculation (min): 30 min   Short Term Goals: Week 1:  PT Short Term Goal 1 (Week 1): Pt will transfer to and from [name] with mod assist consistently PT Short Term Goal 1 - Progress (Week 1): Progressing toward goal PT Short Term Goal 2 (Week 1): Pt will propell WC 141ft with min assist PT Short Term Goal 2 - Progress (Week 1): Progressing toward goal PT Short Term Goal 3 (Week 1): Pt will ambulater 51ft with mod assist and LRAD PT Short Term Goal 3 - Progress (Week 1): Progressing toward goal Week 2:  PT Short Term Goal 1 (Week 2): Pt will transfer to and from [facility] with mod assist consistently PT Short Term Goal 2 (Week 2): Pt will propell WC 174ft with min assist PT Short Term Goal 3 (Week 2): Pt will ambulater 40ft with mod assist and LRAD PT Short Term Goal 4 (Week 2): Patient will transition supine <> sit edge of bed with CGA and verbal cues as needed consistently Week 3:     Skilled Therapeutic Interventions/Progress Updates:    PAIN denies pain this am  Pt transported to gym for session w/focus on midline orientation, core stability, activation of L flank musculature to improve midline  Spt wc to mat w/min assist, pt slightly impulsive/decreased safety awareness.  In sitting performed alternating isometrics for core activation/no difficulty w/this  Sit to stand w/min assist for balance. In standing performed the following activites to promote wt shift to L, core activation, standing balance/stability w/challenge Standing alternating isometrics w/gradual increase in resistance and progressively more rapid release requiring pt to suddenly stabilize - most challenging when ant resistance applied which results in lower core and mild knee buckling/ant destabilization/mod assist Standing - attempted hip  hiking but pt unable to coordinate movement Standing lateral sidebend w/tactile cues to prevent trunk rotation/return to midline using mirror and visual cues/cues to maintain midline Standing/achieving midline/maintaining midline while tapping toes on L - cues to attend to midline/dual task Attempted to progress to mini L march but pt unable to maintain midline and unweight L limb.  At end of session pt transported back to room.  Pt left oob in wc w/alarm belt set and needs in reach  Therapy Documentation Precautions:  Precautions Precautions: Fall Precaution Comments: [name], fluctuating tone in the LUE Restrictions Weight Bearing Restrictions: No    Therapy/Group: Individual Therapy  Callie Fielding, [place] 10/14/2020, 4:52 PM

## 2020-10-14 NOTE — Progress Notes (Signed)
Occupational Therapy Session Note  Patient Details  Name: Howard White MRN: [id number] Date of Birth: [birth year]  Today's Date: 10/14/2020 OT Individual Time:1422  - 1501 39 min      Short Term Goals: Week 2:  OT Short Term Goal 1 (Week 2): Pt will complete UB dressing with min assist for two consecutive sessions. OT Short Term Goal 2 (Week 2): Pt will complete LB bathing with min assist sit to stand for two consecutive sessions. OT Short Term Goal 3 (Week 2): Pt will complete toilet transfers with mod assist stand pivot for two consecutive session. OT Short Term Goal 4 (Week 2): Pt will use the LUE for washing the RUE with mod facilitation for two consecutive sessions.  Skilled Therapeutic Interventions/Progress Updates:    Pt received in w/c with chair alarm engaged, agreeable to therapy. Session focus on LB dressing and LUE NMR. Pt is supervision to doff bilat shoes, max A to don pants (to pull up over hips in standing, thread BLE), and max A to don bilat gripper socks (to thread over toes). Transported pt in w/c to apartment kitchen for energy conservation/ time restraints. In kitchen, pt completed sit<>stand at counter with mod A for initial lift + mod VCs for upright positioning and LLE placement. Pt removed 10 cups from kitchen cabinet while his LUE was facilitated into weightbearing. Activity graded by placing cups towards L side to further challenge weightbearing. Pt then  took ~20 lateral steps along counter with mod A to target dynamic standing balance and LUE weight bearing. Seated in w/c, pt grasped 10 cups using LUE with mod A to facilitate L shoulder flexion and L elbow extension. Demonstrating improved L functional grasp and able to place thumb around cup. Cont to demonstrate deficits in voluntary release. Squat-pivot to bed from w/c with mod A. Left pt semi-reclined in bed with bed alarm engaged, call bell in reach, and all immediate needs met.   Therapy  Documentation Precautions:  Precautions Precautions: Fall Precaution Comments: [name], fluctuating tone in the LUE Restrictions Weight Bearing Restrictions: No  Pain: Pain Assessment Pain Scale: 0-10 Pain Score: 0-No pain   ADL: See Care Tool for more details.    Therapy/Group: Individual Therapy  Volanda Napoleon MS, OTR/L  10/14/2020, 2:16 PM

## 2020-10-15 ENCOUNTER — Inpatient Hospital Stay (HOSPITAL_COMMUNITY): Payer: PPO

## 2020-10-15 ENCOUNTER — Inpatient Hospital Stay (HOSPITAL_COMMUNITY): Payer: PPO | Admitting: *Deleted

## 2020-10-15 ENCOUNTER — Inpatient Hospital Stay (HOSPITAL_COMMUNITY): Payer: PPO | Admitting: Physical Therapy

## 2020-10-15 NOTE — Progress Notes (Signed)
[facility] PHYSICAL MEDICINE & REHABILITATION PROGRESS NOTE   Subjective/Complaints:  No issues overnite, had a BM yesterday   ROS: Denies CP, SOB, N/V/D  Objective:   No results found. Recent Labs    10/13/20 0813  WBC 6.5  HGB 15.8  HCT 47.4  PLT 327   Recent Labs    10/13/20 0813  NA 138  K 3.9  CL 102  CO2 25  GLUCOSE 116*  BUN 9  CREATININE 1.21  CALCIUM 9.6    Intake/Output Summary (Last 24 hours) at 10/15/2020 0820 Last data filed at 10/15/2020 0817 Gross per 24 hour  Intake 720 ml  Output 575 ml  Net 145 ml        Physical Exam: Vital Signs Blood pressure 121/74, pulse (!) 52, temperature 97.6 F (36.4 C), resp. rate 14, height '5\' 10"'  (1.778 m), weight 84.4 kg, SpO2 96 %.    General: No acute distress Mood and affect are appropriate Heart: Regular rate and rhythm no rubs murmurs or extra sounds Lungs: Clear to auscultation, breathing unlabored, no rales or wheezes Abdomen: Positive bowel sounds, soft nontender to palpation, nondistended Extremities: No clubbing, cyanosis, or edema Skin: No evidence of breakdown, no evidence of rash  Musc: No edema in extremities.  No tenderness in extremities. Neuro: Alert Motor: RUE/RLE: 5/5 proximal distal LUE: Shoulder abduction, elbow flexion/extension 4-/5, handgrip 4-/5 with flexor syndergy Brunnstrom 4 Left lower extremity: 4 -/5 proximal distal  Assessment/Plan: 1. Functional deficits which require 3+ hours per day of interdisciplinary therapy in a comprehensive inpatient rehab setting.  Physiatrist is providing close team supervision and 24 hour management of active medical problems listed below.  Physiatrist and rehab team continue to assess barriers to discharge/monitor patient progress toward functional and medical goals  Care Tool:  Bathing    Body parts bathed by patient: Right arm, Face, Left arm, Chest, Abdomen, Front perineal area, Right upper leg, Left upper leg, Right lower leg, Left  lower leg   Body parts bathed by helper: Buttocks     Bathing assist Assist Level: Minimal Assistance - Patient > 75%     Upper Body Dressing/Undressing Upper body dressing   What is the patient wearing?: Pull over shirt    Upper body assist Assist Level: Maximal Assistance - Patient 25 - 49%    Lower Body Dressing/Undressing Lower body dressing      What is the patient wearing?: Incontinence brief, Pants     Lower body assist Assist for lower body dressing: Moderate Assistance - Patient 50 - 74%     Toileting Toileting    Toileting assist Assist for toileting: Maximal Assistance - Patient 25 - 49%     Transfers Chair/bed transfer  Transfers assist     Chair/bed transfer assist level: Minimal Assistance - Patient > 75%     Locomotion Ambulation   Ambulation assist      Assist level: Moderate Assistance - Patient 50 - 74% Assistive device: No Device Max distance: 69f   Walk 10 feet activity   Assist     Assist level: Moderate Assistance - Patient - 50 - 74% Assistive device: No Device   Walk 50 feet activity   Assist Walk 50 feet with 2 turns activity did not occur: Safety/medical concerns  Assist level: Moderate Assistance - Patient - 50 - 74% Assistive device: No Device    Walk 150 feet activity   Assist Walk 150 feet activity did not occur: Safety/medical concerns  Assist level: 2  helpers Assistive device: Lite Gait    Walk 10 feet on uneven surface  activity   Assist Walk 10 feet on uneven surfaces activity did not occur: Safety/medical concerns         Wheelchair     Assist Will patient use wheelchair at discharge?: Yes Type of Wheelchair: Manual    Wheelchair assist level: Moderate Assistance - Patient 50 - 74% Max wheelchair distance: 100    Wheelchair 50 feet with 2 turns activity    Assist        Assist Level: Moderate Assistance - Patient 50 - 74%   Wheelchair 150 feet activity     Assist       Assist Level: Maximal Assistance - Patient 25 - 49%   Blood pressure 121/74, pulse (!) 52, temperature 97.6 F (36.4 C), resp. rate 14, height '5\' 10"'  (1.778 m), weight 84.4 kg, SpO2 96 %.    Medical Problem List and Plan: 1.  Left-sided hemiparesis, now with spasticity and facial droop secondary to acute infarct right basal ganglia/corona radiata on 09/26/2020 as well as history of astrocytoma with left frontal craniotomy resection 20 years ago  Continue CIR- Team conference today please see physician documentation under team conference tab, met with team  to discuss problems,progress, and goals. Formulized individual treatment plan based on medical history, underlying problem and comorbidities.   WHO nightly   Baclofen 108m TID. 2.  Antithrombotics: -DVT/anticoagulation: SCDs             -antiplatelet therapy: Aspirin 81 mg daily and Plavix 75 mg daily x3 weeks then aspirin alone 3. Pain Management: Tylenol as needed.  4. Mood: Lexapro 10 mg daily.  Provide emotional support             -antipsychotic agents: N/A 5. Neuropsych: This patient is capable of making decisions on his own behalf. 6. Skin/Wound Care: Routine skin checks  Santyl to wound 7. Fluids/Electrolytes/Nutrition:  8.  Seizure disorder.    Continue Lamictal 225 mg daily and 300 mg nightly  No seizure since rehab admission  9.  Hyperlipidemia.  LDL 131 on 11/13. Continue Lipitor 10. Hypothyroidism: Synthroid 11.  Leukocytosis:   WBCs 11.6 on 11/17, labs ordered for tomorrow  Afebrile 12.  Bladder incontinence improved 13.  Bowel incont improved  Bowel meds ordered for constipation on 11/28 14.  Sleep is poor cont melatonin increase to 635m Improved LOS: 15 days A FACE TO FACE EVALUATION WAS PERFORMED  AnCharlett Blake2/11/2019, 8:20 AM

## 2020-10-15 NOTE — Patient Care Conference (Signed)
Inpatient RehabilitationTeam Conference and Plan of Care Update Date: 10/15/2020   Time: 10:31 AM    Patient Name: Howard White      Medical Record Number: [id number]  Date of Birth: 1961/09/26 Sex: Male         Room/Bed: 4W13C/4W13C-01 Payor Info: Payor: [employer] / Plan: [employer] PPO / Product Type: *No Product type* /    Admit Date/Time:  09/30/2020  6:21 PM  Primary Diagnosis:  Cerebrovascular accident (CVA) of right basal ganglia [facility])  Hospital Problems: Principal Problem:   Cerebrovascular accident (CVA) of right basal ganglia (HCC) Active Problems:   Basal ganglia stroke (HCC)   Incontinence of feces   Leukocytosis   Seizures (HCC)   Spastic hemiparesis (HCC)   Slow transit constipation   Sleep disturbance    Expected Discharge Date: Expected Discharge Date: 10/29/20  Team Members Present: Physician leading conference: Dr. Alysia Penna Care Coodinator Present: Dorien Chihuahua, RN, BSN, CRRN;Christina Sampson Goon, Wallace Nurse Present: Other (comment) Susie Cassette, RN) PT Present: Estevan Ryder, PT OT Present: Clyda Greener, OT SLP Present: Jettie Booze, CF-SLP PPS Coordinator present : Gunnar Fusi, Novella Olive, PT     Current Status/Progress Goal Weekly Team Focus  Bowel/Bladder   continent of b/b; LBM: 11/30  Remain Continent  assist with toileting needs prn   Swallow/Nutrition/ Hydration             ADL's   Min A for UB bathing and mod A for UB dressing. Mod A for LB bathing + LB dressing for sit to stand. Max A for toilet transfer. LUE function is at gross assist level. Cont to demonstrated decreased recall and impulsiveness.  min assist overall  selfcare retraining, transfer retraining, balance retraining, NMR, cognitive retraining, DME education, pt/family education   Mobility   CGA/MinA bed mobility, MinA/ModA sit <>stands, MinA> MaxA squat pivots, ModA HHA up to 116ft, poor trunk control/head control with dual tasking  min  assist overall at ambulatory level  activity tolerance, core strength, pt education, transfer training, gait, tx, dynamic standing balance, dual tasking   Communication   90-95% intelligible, supervision-Min A intelligibility strategies (although able to recall Mod I)  Supervision A  carryover speech strategies during functional and more cognitively demanding tasks   Safety/Cognition/ Behavioral Observations  ~Min A, sometimes increased cueing required for organization during semi-complex to complex tasks  Supervision-Min  emergent awareness, selective attention, recall with strategies, semi-complex problem solving   Pain   c/o generalized pain; prn tylenol  pain level <4/10  assess pain QS and prn   Skin   skin tear to LLE, foam in place  remain free of new skin breakdown/infection  assess QS and prn     Discharge Planning:  D/C home with parents (mother-primary caregiver) 24/7. 2 level home (able to stay on 1st floor)   Team Discussion: Blood pressure controlled; note tone with activation of right upper extremity and right lean with activity. Continue to note truncal flexion and left upper extremity flexion hold with activity. Progress impaired by impulsivity , decreased attention, little concentration for dual tasks, poor safety awareness and poor trunk strength. Patient on target to meet rehab goals: yes, min assist goals set. Currently min assist for complex memory strategies, min-mod assist for transfers and mod assist for bathing and dressing  *See Care Plan and progress notes for long and short-term goals.   Revisions to Treatment Plan:  Providing cues for attention, max demonstrations cues for activities Teaching Needs: Transfers, cues, toileting,  medications, etc.  Current Barriers to Discharge: Decreased caregiver support  Possible Resolutions to Barriers: Family education with mother and sister     Medical Summary Current Status: BP stable , no recurrent seizures, bowel  and bladder now continent but with urgency  Barriers to Discharge: Medical stability;Other (comments)  Barriers to Discharge Comments: seizure d/o currently controlled Possible Resolutions to Barriers/Weekly Focus: tone is fluctuating   Continued Need for Acute Rehabilitation Level of Care: The patient requires daily medical management by a physician with specialized training in physical medicine and rehabilitation for the following reasons: Direction of a multidisciplinary physical rehabilitation program to maximize functional independence : Yes Medical management of patient stability for increased activity during participation in an intensive rehabilitation regime.: Yes Analysis of laboratory values and/or radiology reports with any subsequent need for medication adjustment and/or medical intervention. : Yes   I attest that I was present, lead the team conference, and concur with the assessment and plan of the team.   Dorien Chihuahua B 10/15/2020, 3:26 PM

## 2020-10-15 NOTE — Progress Notes (Signed)
Occupational Therapy Session Note  Patient Details  Name: Howard White MRN: [id number] Date of Birth: 1961/03/10  Today's Date: 10/15/2020 OT Individual Time: 0915-1000 OT Individual Time Calculation (min): 45 min    Short Term Goals: Week 1:  OT Short Term Goal 1 (Week 1): Pt will complete UB dressing with min assist for two consecutive sessions. OT Short Term Goal 1 - Progress (Week 1): Not met OT Short Term Goal 2 (Week 1): Pt will complete LB dressing with mod assist sit to stand for two consecutive sessions. OT Short Term Goal 2 - Progress (Week 1): Met OT Short Term Goal 3 (Week 1): Pt will complete LB bathing with min assist sit to stand for two consecutive sessions. OT Short Term Goal 3 - Progress (Week 1): Not met OT Short Term Goal 4 (Week 1): Pt will complete self AAROM exercises for the LUE with supervision following handout. OT Short Term Goal 4 - Progress (Week 1): Not met  Skilled Therapeutic Interventions/Progress Updates:    1:1. Pt received in bed agreeable to OT. Pt requesting to problem solve using urinal in bed. Pt donned shorts with A only to advance pants pas thips in stanidng and gaurding A for sitting balance threading Les/donning shoes. Pt transfers into w/c with MIN A and LLE block. Pt completes continent urine void in bed in supine after demo of how to use urinal and towel  to guard for a mess on standard bed in apartment. OT recommend mattress pads for protection as well as depends for at home. Pt completes reaching with forced keeping trunk to back of chair to target reahcing from shoulder with tctile cues at scap to keep shoulders down/back when obtaining bean bags anteriorly and laterally. Edu on attempting isolated wrst est with forearm on table. Exited session with pt seated in w/c, exit alarm on and call light in reach   Therapy Documentation Precautions:  Precautions Precautions: Fall Precaution Comments: [name], fluctuating tone in the  LUE Restrictions Weight Bearing Restrictions: No General:   Vital Signs: Therapy Vitals Temp: 97.6 F (36.4 C) Pulse Rate: (!) 52 Resp: 14 BP: 121/74 Patient Position (if appropriate): Lying Oxygen Therapy SpO2: 96 % O2 Device: Room Air Pain:   ADL: ADL Eating: Set up Where Assessed-Eating: Wheelchair Grooming: Supervision/safety Where Assessed-Grooming: Wheelchair Upper Body Bathing: Minimal assistance Where Assessed-Upper Body Bathing: Edge of bed Lower Body Bathing: Moderate assistance Where Assessed-Lower Body Bathing: Edge of bed Upper Body Dressing: Moderate assistance Where Assessed-Upper Body Dressing: Edge of bed Lower Body Dressing: Maximal assistance Where Assessed-Lower Body Dressing: Edge of bed Toileting: Maximal assistance Where Assessed-Toileting: Bedside Commode Toilet Transfer: Maximal assistance Toilet Transfer Method: Stand pivot Science writer: Radiographer, therapeutic: Not assessed Social research officer, government: Not assessed Vision   Perception    Praxis   Exercises:   Other Treatments:     Therapy/Group: Individual Therapy  Tonny Branch 10/15/2020, 6:52 AM

## 2020-10-15 NOTE — Progress Notes (Signed)
Physical Therapy Session Note  Patient Details  Name: Howard White MRN: [id number] Date of Birth: [birth year]  Today's Date: 10/15/2020 PT Individual Time: 3267-1245 PT Individual Time Calculation (min): 56 min   Short Term Goals: Week 2:  PT Short Term Goal 1 (Week 2): Pt will transfer to and from [facility] with mod assist consistently PT Short Term Goal 2 (Week 2): Pt will propell WC 158ft with min assist PT Short Term Goal 3 (Week 2): Pt will ambulater 46ft with mod assist and LRAD PT Short Term Goal 4 (Week 2): Patient will transition supine <> sit edge of bed with CGA and verbal cues as needed consistently  Skilled Therapeutic Interventions/Progress Updates:    Patient received supine in bed, agreeable to PT. He denies pain. Patient demonstrating particularly poor memory this session asking this PT her name 4 times, 1 minute apart. Patient with no recollection of asking for PTs name previously. He required CGA to come to sit edge of bed due to poor motor planning and increased extensor tone with poor compensatory strategies. Patient able to complete stand pivot with ModA and verbal cues for safety. PT propelling patient in wc to therapy gym for time management and energy conservation. Transfer to therapy mat via stand pivot with MinA. Patient able to complete dynamic standing balance dual task with matching playing cards. He was able to remain standing up to 5 mins with CGA/MinA. Noted excessive ankle strategy with intermittent B genu recurvatum vs. Excessive flexion in stance. Patient able to transition to tall kneeling on mat with [place] x2. B UE on Bosu ball for increased core stability on uneven surface. Patient improving B hip stabilizer strength achieving a more upright posture and holding this position despite B UE on unequal surface. Patient able to transition from sitting on heels <> tall kneeling with B UE support on Bosu + CGA, 3x12. Patient weight shifting in tall kneeling, 3x12 with decreased  weight shift R noted. Patient able to transition into quadruped with MinA x1. Increased flexor tone in L UE noted, but subsided with increased weight bearing through this extremity. Push up+ 3x10 completed with decreased serratus activation in L noted. Patient transitioning into sitting on edge of mat with ModA. He is improving his ability to move out of extensor synergy patterns as noted by his transitions from tall kneeling > quadruped > sitting on edge of mat. Patient ambulating 107ft x6 with emphasis on tall posture and L foot clearance. With distraction, patient returns to significantly flexed posture, NBOS, minimal lateral weight shifting and decreased L foot clearance. This gait pattern is especially prevalent during turns. Patient grossly ModA x1 with close supervision of 2nd person for safety when ambulating. Patient returning to room in wc, seatbelt alarm on, call light within reach.   Therapy Documentation Precautions:  Precautions Precautions: Fall Precaution Comments: [name], fluctuating tone in the LUE Restrictions Weight Bearing Restrictions: No    Therapy/Group: Individual Therapy  Karoline Caldwell, PT, DPT, CBIS  10/15/2020, 8:58 AM

## 2020-10-15 NOTE — Progress Notes (Addendum)
Physical Therapy Session Note  Patient Details  Name: Howard White MRN: [id number] Date of Birth: 01-13-1961  Today's Date: 10/15/2020 PT Individual Time: 9038-3338 PT Individual Time Calculation (min): 40 min   Short Term Goals: Week 3:  PT Short Term Goal 1 (Week 3): Pt will propell WC 170f with min assist PT Short Term Goal 2 (Week 3): Patient will complete bed mobility with supervision PT Short Term Goal 3 (Week 3): Patient will ambulate 330fwith MinA and LRAD for household distances  Skilled Therapeutic Interventions/Progress Updates:    Pt received sitting in WC and agreeable to PT. Pt propelled WC to gym with hemi technique and max verbal cues in order to complete. PT provided mod assist [facility] and forward movement due to pt difficulty coordinating R foot and R hand movements together. Pt also has difficulty performing hemi technique due to height of WC and seat back which causes him to slide anteriorly when his core in not engaged.   Gait Training: -Pt ambulated ~10083f2 with RW and PT providing max assist for L wrist extension and grasp on walker, min assist for trunk support, and max verbal cues for maintaining focus and sequencing. Pt able to complete 2 turns to his left each time through doorways, however noted difficulty staying inside of his walker when turning. Pt needs constant verbal cueing and min-mod facilitation of trunk/weight shift when completing turns.   WC Propulsion: -pt propelled WC ~120f45fth hemi technique and mod assist from PT for maintaining direction in WC. Pt instructed to lean forward and engage his core in order to keep his bottom from sliding forward in his WC.   Pt performed stand pivot transfer with RW and min assist in order to return to bed from WC. [facility] internally distracted with returning to bed and attempted to sit prior to having his back to the bed. Pt interrupted from doing so, stood up and able to complete transfer appropriately with  multiple verbal cues. Pt returned to supine in bed with min assist and bed alarm left on with needs met and call bell in reach. Pt did not report any pain during this session.   Therapy Documentation Precautions:  Precautions Precautions: Fall Precaution Comments: [name], fluctuating tone in the LUE Restrictions Weight Bearing Restrictions: No    Vital Signs: Therapy Vitals Temp: 97.6 F (36.4 C) Temp Source: Oral Pulse Rate: 91 Resp: 19 BP: 131/87 Patient Position (if appropriate): Sitting Oxygen Therapy SpO2: 97 % O2 Device: Room Air Pain: Pain Assessment Pain Score: 0-No pain    Therapy/Group: Individual Therapy  BailGaylord ShihT  I attest that I was present and this session was performed under the supervision of a licensed clinician, and that the documentation accurately reflects treatment performed.   AustBarrie Folk DPT    10/15/2020, 5:19 PM

## 2020-10-15 NOTE — Progress Notes (Signed)
Speech Language Pathology Weekly Progress and Session Note  Patient Details  Name: Howard White MRN: [id number] Date of Birth: [birth year]  Beginning of progress report period: October 07, 2020 End of progress report period: October 15, 2020  Today's Date: 10/15/2020 SLP Individual Time: 9833-8250 SLP Individual Time Calculation (min): 55 min  Short Term Goals: Week 2: SLP Short Term Goal 1 (Week 2): Patient will achieve 90% or greater speech intelligibility at conversational level with minA cues for speech strategy use SLP Short Term Goal 1 - Progress (Week 2): Met SLP Short Term Goal 2 (Week 2): Patient will utilize external memory aides for orientation to time, recall of recent events (therapy sessions, etc) with minA to initiate use. SLP Short Term Goal 2 - Progress (Week 2): Not met SLP Short Term Goal 3 (Week 2): Patient will complete mild complex sequencing, organizing, categorizing tasks with 85% accuracy and minA cues. SLP Short Term Goal 3 - Progress (Week 2): Not met SLP Short Term Goal 4 (Week 2): Patient will demonstrate anticipatory awareness to deficits and impact on function as well as discharge plan (home with parents) with modA cues. SLP Short Term Goal 4 - Progress (Week 2): Met    New Short Term Goals: Week 3: SLP Short Term Goal 1 (Week 3): Patient will achieve 90% or greater speech intelligibility at conversational level with supervision A cues for speech strategy use SLP Short Term Goal 2 (Week 3): Patient will utilize external memory aides for orientation to time, recall of recent events (therapy sessions, etc) with minA to initiate use. SLP Short Term Goal 3 (Week 3): Patient will complete mild complex sequencing, organizing, categorizing tasks with 85% accuracy and minA cues. SLP Short Term Goal 4 (Week 3): Patient will demonstrate anticipatory awareness to deficits and impact on function as well as discharge plan (home with parents) with min A cues.  Weekly  Progress Updates: Pt made moderate progress meeting 2 out 4 goals this reporting period. Pt demonstrated improvement in carryover of speech intelligibility strategies and overall anticipatory awareness of deficits impacting function at home. Pt continues to demonstrate deficits in safety awareness/implusitivty and immediate/short term recall in which internal strategies are needed versus written aids. Pt is making slow improvements in mildly complex problem solving during familiar tasks. SLP question's pts safety at discharge due to above mention deficits, but will continue to address in next reporting period. Pt would continue to benefit from skilled ST services in order to maximize functional independence and reduce burden of care, requiring supervision at discharge with continued skilled ST services.      Intensity: Minumum of 1-2 x/day, 30 to 90 minutes Frequency: 3 to 5 out of 7 days Duration/Length of Stay: 12/15 Treatment/Interventions: Cognitive remediation/compensation;Cueing hierarchy;Speech/Language facilitation;Internal/external aids;Patient/family education;Functional tasks   Daily Session  Skilled Therapeutic Interventions:Skilled ST services focused on cognitive skills. Pt demonstrated basic problem solving, pressed call bell to use restroom piror to ST entering, however voided in brief prior to anyone arriving. SLP assisted pt in changing brief and shorts in a sit to stand transfer from [facility]. Pt required max A verbal cues fading to mod A for safety awareness in preforming task and ultimately two staff members were required to stand him up safety. Pt was able to keep his balance and sit safety with min A. SLP facilitated mildly complex problem solving, error awareness and recall skills in organization task (kitchen shelves) pt required mod A verbal cues. Pt expressed baseline of difficulty with deductive reasoning and "  I do better with numbers." Pt was able to complete mildly complex  account balancing task with calculator (and SLP recording due to poor handwriting) with min A verbal cues for problem solving/error awareness and mod A verbal cues for recall strategies within task.  Pt was left in room with call bell within reach and chair alarm set. SLP recommends to continue skilled services.  General    Pain Pain Assessment Pain Score: 0-No pain  Therapy/Group: Individual Therapy  Loria Lacina  [facility] 10/15/2020, 3:38 PM

## 2020-10-15 NOTE — Progress Notes (Signed)
Patient ID: Howard White, male   DOB: 03-15-1961, 59 y.o.   MRN: [id number]  Team Conference Report to Patient/Family  Team Conference discussion was reviewed with the patient and caregiver, including goals, any changes in plan of care and target discharge date.  Patient and caregiver express understanding and are in agreement.  The patient has a target discharge date of 10/29/20.  [name] 10/15/2020, 1:25 PM

## 2020-10-15 NOTE — Evaluation (Signed)
Recreational Therapy Assessment and Plan  Patient Details  Name: Howard White MRN: [id number] Date of Birth: 1961-04-17 Today's Date: 10/15/2020  Rehab Potential:  Good ELOS: 12/15   Hospital Problem: Principal Problem:   Cerebrovascular accident (CVA) of right basal ganglia ([place])   Past Medical History:      Past Medical History:  Diagnosis Date  . Brain cancer ([place])   . Seizures ([place])   . Thyroid disease    Hypothyroid   Past Surgical History:       Past Surgical History:  Procedure Laterality Date  . brain cancer    . 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 Per chart review patient lives with elderly parents. Reportedly independent prior to admission. Presented 09/26/2020 left-sided weakness facial droop. Cranial CT scan showed no acute intracranial abnormality or significant interval change. Left frontal craniotomy with resection cavity noted. CT angiogram of head and neck without large vessel occlusion or limiting proximal stenosis. MRI of the brain showed a 1.9 x 1.2 cm acute infarct extending from the right corona radiata into the right basal ganglia. Echocardiogram with ejection fraction of 60 to 65% no wall motion abnormalities.  Patient transferred to CIR on 09/30/2020.   Pt presents with decreased activity tolerance, decreased functional mobility, decreased balance, decreased coordination, left inattention,  decreased attention, decreased awareness, decreased problem solving, decreased safety awareness, decreased memory and delayed processing Limiting pt's independence with leisure/community pursuits.   Plan  Min 1 TR session >20 minutes per week during LOS  Recommendations for other  services: Neuropsych  Discharge Criteria: Patient will be discharged from TR if patient refuses treatment 3 consecutive times without medical reason.  If treatment goals not met, if there is a change in medical status, if patient makes no progress towards goals or if patient is discharged from hospital.  The above assessment, treatment plan, treatment alternatives and goals were discussed and mutually agreed upon: by patient  Aquia Harbour 10/15/2020, 3:48 PM

## 2020-10-15 NOTE — Progress Notes (Signed)
Physical Therapy Weekly Progress Note  Patient Details  Name: Howard White MRN: [id number] Date of Birth: [birth year]  Beginning of progress report period: October 08, 2020 End of progress report period: October 15, 2020   Patient has met 3 of 4 short term goals.  Patient making steady progress toward his goals. His ability to complete dual tasks, such as maintain standing balance + cog task is improving. He is able to ambulate with ModA x1 + close supervision of a 2nd person for safety with HHA. His assistance needed for stand pivot/squat pivot transfers continues to vary from CGA to MaxA depending on fatigue level and current level of attention to task. With divided attention, patient continues to demo poor core stability and poor head control as well as fluctuating levels of L foot clearance when ambulating/transferring. He remains very impulsive with poor safety awareness. His poor memory limits his carryover for learned safety precautions/ transfer and gait techniques.   Patient continues to demonstrate the following deficits muscle weakness, decreased cardiorespiratoy endurance, impaired timing and sequencing, abnormal tone, unbalanced muscle activation, ataxia, decreased coordination and decreased motor planning, decreased visual perceptual skills, decreased midline orientation, decreased attention to left and decreased motor planning, decreased initiation, decreased attention, decreased awareness, decreased problem solving, decreased safety awareness, decreased memory and delayed processing and decreased sitting balance, decreased standing balance, decreased postural control, hemiplegia and decreased balance strategies and therefore will continue to benefit from skilled PT intervention to increase functional independence with mobility.  Patient progressing toward long term goals..  Continue plan of care.  PT Short Term Goals Week 2:  PT Short Term Goal 1 (Week 2): Pt will transfer to and  from [facility] with mod assist consistently PT Short Term Goal 1 - Progress (Week 2): Met PT Short Term Goal 2 (Week 2): Pt will propell WC 176f with min assist PT Short Term Goal 2 - Progress (Week 2): Progressing toward goal PT Short Term Goal 3 (Week 2): Pt will ambulater 374fwith mod assist and LRAD PT Short Term Goal 3 - Progress (Week 2): Met PT Short Term Goal 4 (Week 2): Patient will transition supine <> sit edge of bed with CGA and verbal cues as needed consistently PT Short Term Goal 4 - Progress (Week 2): Met Week 3:  PT Short Term Goal 1 (Week 3): Pt will propell WC 10061fith min assist PT Short Term Goal 2 (Week 3): Patient will complete bed mobility with supervision PT Short Term Goal 3 (Week 3): Patient will ambulate [id number] MinA and LRAD for household distances    Therapy Documentation Precautions:  Precautions Precautions: Fall Precaution Comments: [name], fluctuating tone in the LUE Restrictions Weight Bearing Restrictions: No    JennDebbora Dus1/2021, 9:12 AM

## 2020-10-16 ENCOUNTER — Inpatient Hospital Stay (HOSPITAL_COMMUNITY): Payer: PPO | Admitting: Occupational Therapy

## 2020-10-16 ENCOUNTER — Inpatient Hospital Stay (HOSPITAL_COMMUNITY): Payer: PPO | Admitting: Physical Therapy

## 2020-10-16 ENCOUNTER — Inpatient Hospital Stay: Payer: PPO

## 2020-10-16 DIAGNOSIS — Z23 Encounter for immunization: Secondary | ICD-10-CM

## 2020-10-16 NOTE — Progress Notes (Signed)
   Covid-19 Vaccination Clinic  Name:  Howard White    MRN: [id number] DOB: 1961/06/20  10/16/2020  [name] was observed post Covid-19 immunization for 15 minutes without incident. He was provided with Vaccine Information Sheet and instruction to access the V-Safe system.   [name] was instructed to call 911 with any severe reactions post vaccine: Marland Kitchen Difficulty breathing  . Swelling of face and throat  . A fast heartbeat  . A bad rash all over body  . Dizziness and weakness   Immunizations Administered    Name Date Dose VIS Date Route   Pfizer COVID-19 Vaccine 10/16/2020 11:10 AM 0.3 mL 09/03/2020 Intramuscular   Manufacturer: [place]   Lot: [id number]   [place]: [id number]

## 2020-10-16 NOTE — Progress Notes (Signed)
[facility] PHYSICAL MEDICINE & REHABILITATION PROGRESS NOTE   Subjective/Complaints:  No issues overnite, discussed problem with muscle weakness LUE, also discuseed E stim with pt and OT  ROS: Denies CP, SOB, N/V/D  Objective:   No results found. No results for input(s): WBC, HGB, HCT, PLT in the last 72 hours. No results for input(s): NA, K, CL, CO2, GLUCOSE, BUN, CREATININE, CALCIUM in the last 72 hours.  Intake/Output Summary (Last 24 hours) at 10/16/2020 0832 Last data filed at 10/16/2020 0338 Gross per 24 hour  Intake 480 ml  Output 900 ml  Net -420 ml        Physical Exam: Vital Signs Blood pressure 114/67, pulse (!) 53, temperature (!) 97.4 F (36.3 C), temperature source Oral, resp. rate 18, height 5\' 10"  (1.778 m), weight 84.4 kg, SpO2 98 %.     General: No acute distress Mood and affect are appropriate Heart: Regular rate and rhythm no rubs murmurs or extra sounds Lungs: Clear to auscultation, breathing unlabored, no rales or wheezes Abdomen: Positive bowel sounds, soft nontender to palpation, nondistended Extremities: No clubbing, cyanosis, or edema Skin: No evidence of breakdown, no evidence of rash  Musc: No edema in extremities.  No tenderness in extremities. Neuro: Alert Motor: RUE/RLE: 5/5 proximal distal LUE: Shoulder abduction, elbow flexion/extension 4-/5, handgrip 4-/5 with flexor syndergy Brunnstrom 4 Wrist ext 2-, finger ext 3/5 Left lower extremity: 4 -/5 proximal distal  Assessment/Plan: 1. Functional deficits which require 3+ hours per day of interdisciplinary therapy in a comprehensive inpatient rehab setting.  Physiatrist is providing close team supervision and 24 hour management of active medical problems listed below.  Physiatrist and rehab team continue to assess barriers to discharge/monitor patient progress toward functional and medical goals  Care Tool:  Bathing    Body parts bathed by patient: Right arm, Face, Left arm, Chest,  Abdomen, Front perineal area, Right upper leg, Left upper leg, Right lower leg, Left lower leg   Body parts bathed by helper: Buttocks     Bathing assist Assist Level: Minimal Assistance - Patient > 75%     Upper Body Dressing/Undressing Upper body dressing   What is the patient wearing?: Pull over shirt    Upper body assist Assist Level: Maximal Assistance - Patient 25 - 49%    Lower Body Dressing/Undressing Lower body dressing      What is the patient wearing?: Incontinence brief, Pants     Lower body assist Assist for lower body dressing: Moderate Assistance - Patient 50 - 74%     Toileting Toileting    Toileting assist Assist for toileting: Maximal Assistance - Patient 25 - 49%     Transfers Chair/bed transfer  Transfers assist     Chair/bed transfer assist level: Moderate Assistance - Patient 50 - 74%     Locomotion Ambulation   Ambulation assist      Assist level: Moderate Assistance - Patient 50 - 74% Assistive device: No Device Max distance: 40   Walk 10 feet activity   Assist     Assist level: Moderate Assistance - Patient - 50 - 74% Assistive device: No Device   Walk 50 feet activity   Assist Walk 50 feet with 2 turns activity did not occur: Safety/medical concerns  Assist level: Moderate Assistance - Patient - 50 - 74% Assistive device: No Device    Walk 150 feet activity   Assist Walk 150 feet activity did not occur: Safety/medical concerns  Assist level: 2 helpers Assistive device:  Lite Gait    Walk 10 feet on uneven surface  activity   Assist Walk 10 feet on uneven surfaces activity did not occur: Safety/medical concerns         Wheelchair     Assist Will patient use wheelchair at discharge?: Yes Type of Wheelchair: Manual    Wheelchair assist level: Moderate Assistance - Patient 50 - 74% Max wheelchair distance: 100    Wheelchair 50 feet with 2 turns activity    Assist        Assist Level:  Moderate Assistance - Patient 50 - 74%   Wheelchair 150 feet activity     Assist      Assist Level: Maximal Assistance - Patient 25 - 49%   Blood pressure 114/67, pulse (!) 53, temperature (!) 97.4 F (36.3 C), temperature source Oral, resp. rate 18, height 5\' 10"  (1.778 m), weight 84.4 kg, SpO2 98 %.    Medical Problem List and Plan: 1.  Left-sided hemiparesis, now with spasticity and facial droop secondary to acute infarct right basal ganglia/corona radiata on 09/26/2020 as well as history of astrocytoma with left frontal craniotomy resection 20 years ago  Continue CIR-    WHO nightly   Baclofen 5mg  TID. 2.  Antithrombotics: -DVT/anticoagulation: SCDs             -antiplatelet therapy: Aspirin 81 mg daily and Plavix 75 mg daily x3 weeks then aspirin alone- Plan to D/C Plavix on 12/4 3. Pain Management: Tylenol as needed.  4. Mood: Lexapro 10 mg daily.  Provide emotional support             -antipsychotic agents: N/A 5. Neuropsych: This patient is capable of making decisions on his own behalf. 6. Skin/Wound Care: Routine skin checks  Santyl to wound 7. Fluids/Electrolytes/Nutrition:  8.  Seizure disorder.    Continue Lamictal 225 mg daily and 300 mg nightly  No seizure since rehab admission  9.  Hyperlipidemia.  LDL 131 on 11/13. Continue Lipitor 10. Hypothyroidism: Synthroid 11.  Leukocytosis:   WBCs 11.6 on 11/17, labs ordered for tomorrow  Afebrile 12.  Bladder incontinence improved 13.  Bowel incont improved  Bowel meds ordered for constipation on 11/28 14.  Sleep is poor cont melatonin increase to 6mg   Improved LOS: 16 days A FACE TO FACE EVALUATION WAS PERFORMED  Charlett Blake 10/16/2020, 8:32 AM

## 2020-10-16 NOTE — Progress Notes (Addendum)
Physical Therapy Session Note  Patient Details  Name: Howard White MRN: [id number] Date of Birth: 1961/05/20  Today's Date: 10/16/2020 PT Individual Time: 8413-2440 PT Individual Time Calculation (min): 63 min   Short Term Goals: Week 3:  PT Short Term Goal 1 (Week 3): Pt will propell WC 165ft with min assist PT Short Term Goal 2 (Week 3): Patient will complete bed mobility with supervision PT Short Term Goal 3 (Week 3): Patient will ambulate 34ft with MinA and LRAD for household distances  Skilled Therapeutic Interventions/Progress Updates:    Pt received sitting in w/c and eager to participate in therapy session. Transported to/from gym in w/c for time management and energy conservation. R stand pivot w/c>EOM with min assist for balance and pt using R UE support on therapist as needed - therapist manually facilitating R/L weight shift to promote stepping. Sit<>stands with min assist for balance throughout session - cuing for increased L LE WBing. Standing, no UE support, with mirror feedback targeting improved midline trunk orientation (avoiding excessive R lateral trunk flexion) while therapist applied PNF style rhythmic stabilization to his trunk in different directions focusing on pt maintaining isometric contractions of core musculature to hold midline posture - intermittent min assist for balance - pt noted to have difficulty with resistance applied anterior/posterior and to L side but did not notice the bilateral knee flexion associated with anterior resistance today. Progressed to dynamic standing balance, no UE support, via attempting L LE hamstring curls while sustaining midline posture with min/mod assist of 1 for balance - pt responds best to having +2 assist perform the task as visual demonstration and then pt attempt to replicate the movement for improved motor planning. Gait training ~56ft, no UE support, x2 with min/mod assist of 1 person and +2 present for safety - pt demos  significant improvement but still has mild R lateral trunk flexion during L LE swing phase associated with impaired L LE hip/knee flexion for foot clearance - cuing and manual facilitation for improvement - mirror feedback used for a portion of the gait training. Transitioned back to standing balance/motor control task using mirror feedback while holding a 6lb weight in R hand performing R/L lateral trunk leans targeting increased L core activation/shortening with pt having difficulty motor planning this task due to excessive forward trunk flexion - attempted holding weight in R hand while performing L LE pre-gait forward/backwards stepping but no significant improvement noted in trunk alignment. Gait training ~16ft x 2, no AD, with min assist of 1 (intermittent mod assist) while +2 assist provided external target for pt to reach overhead with R UE to touch throughout gait focusing on L trunk shortening and R trunk elongation, especially during L swing phase - demos some improved trunk alignment but does intermittently push down into +2 assist's hand for support. Standing at stairs with R UE support on HR performed L LE NMR targeting improved motor planning for swing phase via staggered stance stepping L LE over yoga block and onto 1st 6" height step - min assist for balance - therapist blocking R LE from moving to maintain staggered stance to force L LE hamstring activation from a hip extended position - with increased repetitions demos improving motor planning. Pt reports his w/c back is forcing too much anterior pelvic tilt causing pain and limiting his ability to participate in upright, OOB activity - transitioned pt to different w/c with different back support with improved trunk alignment noted. Transported back to room in w/c and pt  agreeable to remain sitting up for dinner - left with needs in reach, L UE supported on 1/2 lap tray, and seat belt alarm on.  Addendum: During session therapist strapped L  hand to flat hand paddle to promote increased finger extension and decreased flexor tone during dynamic balance and gait training.   Therapy Documentation Precautions:  Precautions Precautions: Fall Precaution Comments: [name], fluctuating tone in the LUE Restrictions Weight Bearing Restrictions: No  Pain:   No reports of pain during session.   Therapy/Group: Individual Therapy  Tawana Scale , PT, DPT, CSRS  10/16/2020, 12:49 PM

## 2020-10-16 NOTE — Progress Notes (Signed)
Occupational Therapy Session Note  Patient Details  Name: Howard White MRN: [id number] Date of Birth: 11-Aug-1961  Today's Date: 10/16/2020 OT Individual Time: 0102-7253 OT Individual Time Calculation (min): 70 min    Short Term Goals: Week 2:  OT Short Term Goal 1 (Week 2): Pt will complete UB dressing with min assist for two consecutive sessions. OT Short Term Goal 2 (Week 2): Pt will complete LB bathing with min assist sit to stand for two consecutive sessions. OT Short Term Goal 3 (Week 2): Pt will complete toilet transfers with mod assist stand pivot for two consecutive session. OT Short Term Goal 4 (Week 2): Pt will use the LUE for washing the RUE with mod facilitation for two consecutive sessions.  Skilled Therapeutic Interventions/Progress Updates:    Pt in bed to start session, requesting to use the bathroom.  He was able to transfer to sitting with supervision but demonstrates impulsive quick movement, requiring cueing to slow down.  He ambulated to the bathroom X2 during session with mod facilitation and no device.  Increased cervical ahdn trunk flexion noted with ambulation and pt at times demonstrating LOB to the left, requiring mod assist to regain his balance, stop, and then continue walking.  He was able to complete toilet hygiene and clothing management with max assist for thoroughness with sit to stand multiple times.  Pt with decreased awareness and impulsivity during toilet hygiene, bending forward rapidly to wipe between his legs, flexing his trunk and requiring mod assist to maintain balance.  Pt getting stool on his hand as well as his shirt sleeve secondary to decreased awareness and impulsivity.  He completed clothing management in standing with mod assist and then ambulated back out to the wheelchair.  Took to down to the therapy gym and had him transfer to the therapy mat with min assist stand pivot.  Therapist applied NMES to the left digit and wrist extensors to help  facilitate movement.  He was able to tolerate approximately 5 mins of active stimulation with cues to straighten the digits when stimulation was active.  He would still exhibit some flexor tone in the digits when told to straighten them, but it was inconsistent.  Intensity was set on level 38 with PPS at 35 and pulse width at 300.  Time on 15 seconds and time off was 5 seconds.  No pain or discomfort noted or reported at end of stimulation.  Pt returned to the wheelchair at the end of the session and was left sitting up with the safety belt in place and with the call button and phone in reach.    Therapy Documentation Precautions:  Precautions Precautions: Fall Precaution Comments: [name], fluctuating tone in the LUE Restrictions Weight Bearing Restrictions: No  Pain: Pain Assessment Pain Scale: Faces Pain Score: 0-No pain ADL: See Care Tool Section for some details of mobility and selfcare  Therapy/Group: Individual Therapy  Geo Slone OTR/L 10/16/2020, 3:51 PM

## 2020-10-16 NOTE — Progress Notes (Signed)
Physical Therapy Session Note  Patient Details  Name: Howard White MRN: [id number] Date of Birth: 18-Sep-1961  Today's Date: 10/16/2020 PT Individual Time: 1005-1100 PT Individual Time Calculation (min): 55 min   Short Term Goals: Week 1:  PT Short Term Goal 1 (Week 1): Pt will transfer to and from [facility] with mod assist consistently PT Short Term Goal 1 - Progress (Week 1): Progressing toward goal PT Short Term Goal 2 (Week 1): Pt will propell WC 175f with min assist PT Short Term Goal 2 - Progress (Week 1): Progressing toward goal PT Short Term Goal 3 (Week 1): Pt will ambulater 394fwith mod assist and LRAD PT Short Term Goal 3 - Progress (Week 1): Progressing toward goal Week 2:  PT Short Term Goal 1 (Week 2): [facility] Short Term Goal 1 - Progress (Week 2): Met PT Short Term Goal 2 (Week 2): Pt will propell WC 10075fith min assist PT Short Term Goal 2 - Progress (Week 2): Progressing toward goal PT Short Term Goal 3 (Week 2): Pt will ambulater [id number] mod assist and LRAD PT Short Term Goal 3 - Progress (Week 2): Met PT Short Term Goal 4 (Week 2): Patient will transition supine <> sit edge of bed with CGA and verbal cues as needed consistently PT Short Term Goal 4 - Progress (Week 2): Met  Skilled Therapeutic Interventions/Progress Updates:    pt received in bed and agreeable to therapy. Denied pain at start and end of session. Pt immediately reported he needed to be trained on how to use the urinal in bed because he was very frustrated that he was unable to complete this himself without getting urine on his sheets. PT verbally instructed pt on how to complete this with rehab tech present, then pt undid his brief and requested to "practice". Pt able to place urinal with Rt hand and verbally instructed on technique to prevent spilling urine. Pt agreeable and required mod A for secure brief. Pt then directed in donning TED hose, max A  and pants in bed mod A. Pt directed in supine>sit min A with pt demonstrating impulsive technique and benefited from VC for safety and improved technique. Min A for donning shoes in sitting EOB. Pt directed in squat pivot to WC mod A. Pt taken to gym total A for time and energy conservation. Pt directed in x5 reps of gait training without AD, Hand held assist with pt requiring min A consistently for Sit to stand transfers to standing. For gait training, pt completed 4 reps of 30' in gym varied levels of assist dependent on fatigue and technique with gait pattern from min A - mod A and did have multiple LOB with turns and with straight paths requiring mod A to correct, pt required very frequent VC for gait pattern LLE, trunk control and posture, and safety awareness with decreasing speed of mobility, pt did benefit [facility] stopping gait when needed to "reset" and try again instead of continuing to push through poor gait pattern or unsafe technique. 1/5 reps with gait pt completed 50' min A-mod A with similar need of cues and assist. Pt also instructed to progressively use less assist with second helper with last 3 reps only of one person assist.  Pt then directed in 4x5 each LE on 5" step up for toe taps for improved weight shifting and step advancement, pt required min A- mod A for stability  and single step cues for safety and technique. Pt taken back to room in [facility] total A for time, left in WC, alarm belt set, All needs in reach and in good condition. Call light in hand.    Therapy Documentation Precautions:  Precautions Precautions: Fall Precaution Comments: [name], fluctuating tone in the LUE Restrictions Weight Bearing Restrictions: No General:   Vital Signs:  Pain:   Mobility:   Locomotion :    Trunk/Postural Assessment :    Balance:   Exercises:   Other Treatments:      Therapy/Group: Individual Therapy  Junie Panning 10/16/2020, 1:47 PM

## 2020-10-17 ENCOUNTER — Inpatient Hospital Stay (HOSPITAL_COMMUNITY): Payer: PPO | Admitting: Occupational Therapy

## 2020-10-17 ENCOUNTER — Inpatient Hospital Stay (HOSPITAL_COMMUNITY): Payer: PPO | Admitting: Physical Therapy

## 2020-10-17 ENCOUNTER — Inpatient Hospital Stay (HOSPITAL_COMMUNITY): Payer: PPO | Admitting: Speech Pathology

## 2020-10-17 NOTE — Progress Notes (Signed)
[facility] PHYSICAL MEDICINE & REHABILITATION PROGRESS NOTE   Subjective/Complaints:  Occ incont of urine, pt has urgency and cannot call in time, has difficulty handling urinal  ROS: Denies CP, SOB, N/V/D  Objective:   No results found. No results for input(s): WBC, HGB, HCT, PLT in the last 72 hours. No results for input(s): NA, K, CL, CO2, GLUCOSE, BUN, CREATININE, CALCIUM in the last 72 hours.  Intake/Output Summary (Last 24 hours) at 10/17/2020 0833 Last data filed at 10/17/2020 0830 Gross per 24 hour  Intake 940 ml  Output 400 ml  Net 540 ml        Physical Exam: Vital Signs Blood pressure 113/64, pulse (!) 56, temperature 97.8 F (36.6 C), temperature source Oral, resp. rate 18, height 5\' 10"  (1.778 m), weight 84.4 kg, SpO2 95 %.     General: No acute distress Mood and affect are appropriate Heart: Regular rate and rhythm no rubs murmurs or extra sounds Lungs: Clear to auscultation, breathing unlabored, no rales or wheezes Abdomen: Positive bowel sounds, soft nontender to palpation, nondistended Extremities: No clubbing, cyanosis, or edema Skin: No evidence of breakdown, no evidence of rash  Musc: No edema in extremities.  No tenderness in extremities. Neuro: Alert Motor: RUE/RLE: 5/5 proximal distal LUE: Shoulder abduction, elbow flexion/extension 4-/5, handgrip 4-/5 with flexor syndergy [place]strom 4 Wrist ext 2-, finger ext 3/5 Left lower extremity: 4 -/5 proximal distal  Assessment/Plan: 1. Functional deficits which require 3+ hours per day of interdisciplinary therapy in a comprehensive inpatient rehab setting.  Physiatrist is providing close team supervision and 24 hour management of active medical problems listed below.  Physiatrist and rehab team continue to assess barriers to discharge/monitor patient progress toward functional and medical goals  Care Tool:  Bathing    Body parts bathed by patient: Right arm, Face, Left arm, Chest, Abdomen,  Front perineal area, Right upper leg, Left upper leg, Right lower leg, Left lower leg   Body parts bathed by helper: Buttocks     Bathing assist Assist Level: Minimal Assistance - Patient > 75%     Upper Body Dressing/Undressing Upper body dressing   What is the patient wearing?: Pull over shirt    Upper body assist Assist Level: Moderate Assistance - Patient 50 - 74%    Lower Body Dressing/Undressing Lower body dressing      What is the patient wearing?: Incontinence brief     Lower body assist Assist for lower body dressing: Maximal Assistance - Patient 25 - 49%     Toileting Toileting    Toileting assist Assist for toileting: Maximal Assistance - Patient 25 - 49%     Transfers Chair/bed transfer  Transfers assist     Chair/bed transfer assist level: Minimal Assistance - Patient > 75% (stand pivot)     Locomotion Ambulation   Ambulation assist      Assist level: Moderate Assistance - Patient 50 - 74% Assistive device: No Device Max distance: 152ft   Walk 10 feet activity   Assist     Assist level: Moderate Assistance - Patient - 50 - 74% Assistive device: No Device   Walk 50 feet activity   Assist Walk 50 feet with 2 turns activity did not occur: Safety/medical concerns  Assist level: Moderate Assistance - Patient - 50 - 74% Assistive device: No Device    Walk 150 feet activity   Assist Walk 150 feet activity did not occur: Safety/medical concerns  Assist level: 2 helpers Assistive device: Lite Gait  Walk 10 feet on uneven surface  activity   Assist Walk 10 feet on uneven surfaces activity did not occur: Safety/medical concerns         Wheelchair     Assist Will patient use wheelchair at discharge?: Yes Type of Wheelchair: Manual    Wheelchair assist level: Moderate Assistance - Patient 50 - 74% Max wheelchair distance: 100    Wheelchair 50 feet with 2 turns activity    Assist        Assist Level:  Moderate Assistance - Patient 50 - 74%   Wheelchair 150 feet activity     Assist      Assist Level: Maximal Assistance - Patient 25 - 49%   Blood pressure 113/64, pulse (!) 56, temperature 97.8 F (36.6 C), temperature source Oral, resp. rate 18, height 5\' 10"  (1.778 m), weight 84.4 kg, SpO2 95 %.    Medical Problem List and Plan: 1.  Left-sided hemiparesis, now with spasticity and facial droop secondary to acute infarct right basal ganglia/corona radiata on 09/26/2020 as well as history of astrocytoma with left frontal craniotomy resection 20 years ago  Continue CIR-    WHO nightly   Baclofen 5mg  TID. 2.  Antithrombotics: -DVT/anticoagulation: SCDs             -antiplatelet therapy: Aspirin 81 mg daily and Plavix 75 mg daily x3 weeks then aspirin alone- Plan to D/C Plavix on 12/4 3. Pain Management: Tylenol as needed.  4. Mood: Lexapro 10 mg daily.  Provide emotional support             -antipsychotic agents: N/A 5. Neuropsych: This patient is capable of making decisions on his own behalf. 6. Skin/Wound Care: Routine skin checks  Santyl to wound 7. Fluids/Electrolytes/Nutrition:  8.  Seizure disorder.    Continue Lamictal 225 mg daily and 300 mg nightly  No seizure since rehab admission  9.  Hyperlipidemia.  LDL 131 on 11/13. Continue Lipitor 10. Hypothyroidism: Synthroid 11.  Leukocytosis: resolved last WBC 11/29 6.5K    Afebrile 12.  Bladder incontinence improved, still with occ urge incont 13.  Bowel incont improved   14.  Sleep is poor cont melatonin increase to 6mg   Improved LOS: 17 days A FACE TO FACE EVALUATION WAS PERFORMED  Charlett Blake 10/17/2020, 8:33 AM

## 2020-10-17 NOTE — Progress Notes (Signed)
Speech Language Pathology Daily Session Note  Patient Details  Name: Howard White MRN: [id number] Date of Birth: 1961-08-27  Today's Date: 10/17/2020 SLP Individual Time: 1300-1400 SLP Individual Time Calculation (min): 60 min  Short Term Goals: Week 3: SLP Short Term Goal 1 (Week 3): Patient will achieve 90% or greater speech intelligibility at conversational level with supervision A cues for speech strategy use SLP Short Term Goal 2 (Week 3): Patient will utilize external memory aides for orientation to time, recall of recent events (therapy sessions, etc) with minA to initiate use. SLP Short Term Goal 3 (Week 3): Patient will complete mild complex sequencing, organizing, categorizing tasks with 85% accuracy and minA cues. SLP Short Term Goal 4 (Week 3): Patient will demonstrate anticipatory awareness to deficits and impact on function as well as discharge plan (home with parents) with min A cues.  Skilled Therapeutic Interventions:   Patient seen for skilled ST session focusing on speech-language and cognitive functioning. Speech intelligibility was approximately 90% at conversational level without cues and patient was maintaining vocal intensity WFL with supervision level A. Patient completed pill box simulation after reviewing his current medication list with SLP. He initially required modA cues demonstrate adequate attention and problem solving for this task, however after a couple repetitions, cue intensity and frequency improved to minA overall. Patient was more attentive, alert, less distracted and calm today as compared to previous session that this SLP had worked with him. He appeared to recognize SLP minimally. Patient continues to benefit from skilled SLP intervention to maximize cognitive-linguistic and speech function prior to discharge.   Pain Pain Assessment Pain Scale: 0-10 Pain Score: 0-No pain  Therapy/Group: Individual Therapy   Sonia Baller, MA, CCC-SLP Speech  Therapy

## 2020-10-17 NOTE — Progress Notes (Signed)
Physical Therapy Session Note  Patient Details  Name: Howard White MRN: [id number] Date of Birth: 10-26-1961  Today's Date: 10/17/2020 PT Individual Time: 0905-1005 PT Individual Time Calculation (min): 60 min   Short Term Goals: Week 3:  PT Short Term Goal 1 (Week 3): Pt will propell WC 157ft with min assist PT Short Term Goal 2 (Week 3): Patient will complete bed mobility with supervision PT Short Term Goal 3 (Week 3): Patient will ambulate 43ft with MinA and LRAD for household distances  Skilled Therapeutic Interventions/Progress Updates:    Pt received supine in bed reporting need to use bathroom and agreeable to therapy session. Pt reports he was "sore" last night after all of his therapy. Therapist educated pt on increasing his safety awareness and having more slow/controlled movements especially during toileting as pt moves quick making him unsafe, discussed using motor imagery to plan a task prior to initiating it with pt receptive to this idea - will require further reinforcement of this education. Therapist confirmed with SW that pt's family requested to attend education/training this Sunday 10/19/20 - therapist educated pt that his LOF on 12/3 may not reflect his LOF upon discharge and that the session will be focused primarily on initial education of pt's impairments but may not include a lot of hands-on training depending on family's ability to provide current level of assist - pt in agreement with this plan. Supine>sitting R EOB with close supervision for safety. Sit<>stands, no AD, with CGA/min assist for steadying/balance throughout session. Pt is improving his ability to wait for cuing to ensure safety with mobility. Gait training ~40ft x2 in/out bathroom, no AD, with min/mod assist for balance. Standing with min assist for balance doffed LB clothing - unable to have BM despite attempts. Therapist provided pt with RW for increased stability and as a cue for pt not to lean trunk so  excessively forward causing LOB while perform peri-care - min assist for balance and pt demoing poor motor planning with peri-care using excessive amounts of washcloths due to not wiping efficiently, poor correction upon cuing and requiring assist at end for cleanliness. Standing hand hygiene at sink leaning hips onto sink for stability with CGA for steadying.  Transported to/from gym in w/c for time management and energy conservation. Gait training ~171ft, ~32ft, no UE support, with min, intermittent mod assist for balance - while improving, continues to demo R lateral trunk flexion during L swing phase with lack of L LE hip/knee flexion activation (poor motor planning/recruitment) to clear foot and perform reciprocal stepping pattern - continues to have impaired divided attention requiring frequent/repeated cuing to correct both trunk and L LE impairments simultaneously - often will stop and "reset" after having LOB from not clearing L foot fully. Therapist donned L flat hand paddle to decrease L UE flexor tone throughout session. L LE NMR at steps via repeated L LE foot taps over yoga block on/off 1st 6" step while in staggered stance to promote L hamstring activation from a hip extended position -  R UE support on HR progressed to R HHA to promote increased upright trunk posture - min progressing to mod assist for balance via R HHA from +2 assist - continued cuing to activate L core musculature and avoid R lateral trunk flexion while stepping with L LE. Gait training ~173ft, no UE support, with min/mod assist of 1 and +2 assist walking in front of patient to provide visual demonstration of proper gait mechanics with pt demoing improving reciprocal stepping  pattern and decreased instances of stopping to "reset". Transported back to room and pt agreeable to remain sitting up in w/c - left with needs in reach, seat belt alarm on, and L UE supported on 1/2 lap tray.  Therapy Documentation Precautions:   Precautions Precautions: Fall Precaution Comments: [name], fluctuating tone in the LUE Restrictions Weight Bearing Restrictions: No  Pain: Reports "soreness" from yesterdays therapy sessions but otherwise no reports of pain.    Therapy/Group: Individual Therapy  Tawana Scale , PT, DPT, CSRS  10/17/2020, 7:47 AM

## 2020-10-17 NOTE — Progress Notes (Signed)
Physical Therapy Session Note  Patient Details  Name: Howard White MRN: [id number] Date of Birth: 03/28/1961  Today's Date: 10/17/2020 PT Individual Time: 2778-2423 PT Individual Time Calculation (min): 29 min   Short Term Goals: Week 1:  PT Short Term Goal 1 (Week 1): Pt will transfer to and from [facility] with mod assist consistently PT Short Term Goal 1 - Progress (Week 1): Progressing toward goal PT Short Term Goal 2 (Week 1): Pt will propell WC 131f with min assist PT Short Term Goal 2 - Progress (Week 1): Progressing toward goal PT Short Term Goal 3 (Week 1): Pt will ambulater 341fwith mod assist and LRAD PT Short Term Goal 3 - Progress (Week 1): Progressing toward goal Week 2:  PT Short Term Goal 1 (Week 2): [facility] PT Short Term Goal 1 - Progress (Week 2): Met PT Short Term Goal 2 (Week 2): Pt will propell WC [id number] min assist PT Short Term Goal 2 - Progress (Week 2): Progressing toward goal PT Short Term Goal 3 (Week 2): Pt will ambulater [id number] mod assist and LRAD PT Short Term Goal 3 - Progress (Week 2): Met PT Short Term Goal 4 (Week 2): Patient will transition supine <> sit edge of bed with CGA and verbal cues as needed consistently PT Short Term Goal 4 - Progress (Week 2): Met Week 3:  PT Short Term Goal 1 (Week 3): Pt will propell WC [id number] min assist PT Short Term Goal 2 (Week 3): Patient will complete bed mobility with supervision PT Short Term Goal 3 (Week 3): Patient will ambulate 30ft 36f MinA and LRAD for household distances  Skilled Therapeutic Interventions/Progress Updates:    [facility]. Upon arrival to pt's room, pt reported he thought he had had a BM while in WC. [place] [facility], directed in stand pivot transfer to commode with grab bar use, min A and give Rolling walker for standing balance for doffing shorts and soiled brief, pt did have BM. Pt  ultimately required total A for hygiene, he did attempt but unable to effectively reach for cleaning without LOB. Pt able to remain standing throughout this with use of Rolling walker at CGA, total A for brief change and max A for pulling up pants. Pt directed in stand pivot back to WC, min A and directed in hand hygiene min A. Pt then requested to put on sweats over top of shorts as he was cold, mod A for this. Pt then [facility], directed in Sit to stand in hall CGA for gait prep, then gait training for 75' x2 min A grossly improved with stepping pattern with VC and positive reinforcement with safer steps or improved gait pattern. Pt [facility] at end of session. Pt left in WC, alarm belt set, All needs in reach and in good condition. Call light in hand.    Therapy Documentation Precautions:  Precautions Precautions: Fall Precaution Comments: [name], fluctuating tone in the LUE Restrictions Weight Bearing Restrictions: No General:   Vital Signs:  Pain:   Mobility:   Locomotion :    Trunk/Postural Assessment :    Balance:   Exercises:   Other Treatments:      Therapy/Group: Individual Therapy  Damita Eppard Junie Panning2021, 1:28 PM

## 2020-10-17 NOTE — Progress Notes (Signed)
Patient ID: Howard White, male   DOB: 1961/01/22, 59 y.o.   MRN: [id number]   Additional family education scheduled Monday, December 13th 9-11AM with pt mother.   [place], [place]

## 2020-10-17 NOTE — Progress Notes (Signed)
Occupational Therapy Weekly Progress Note  Patient Details  Name: Howard White MRN: [id number] Date of Birth: 04/03/1961  Beginning of progress report period: October 09, 2020 End of progress report period: October 17, 2020  Today's Date: 10/17/2020 OT Individual Time: 1449-1535 OT Individual Time Calculation (min): 46 min    Patient has met 1 of 4 short term goals.  Howard White continues to make steady progress with OT at this time.  Howard White currently completes UB bathing at min assist level with UB dressing at mod assist level and mod demonstrational cueing to follow hemi dressing techniques.  Howard White needs overall mod assist for LB bathing and dressing.  Toilet transfers are min to mod assist stand pivot, fluctuating at times secondary to impulsivity as well as decreased consistency.  Howard White continues to demonstrate increased LUE weakness and hemiparesis with fluctuating tone in the wrist, digits and shoulder.  Howard White still needs overall max assist to integrate into bathing tasks for washing the RUE.  Howard White is able to use the left hand to hold objects to be opened.  Howard White continues to demonstrate limited memory as well as decreased selective attention and impulsivity overall.  Recommend continued CIR therapy at this time to progress toward min assist level goals with anticipated discharge on 12/15.  Family education to be completed as well.     Patient continues to demonstrate the following deficits: muscle weakness and muscle paralysis, impaired timing and sequencing, abnormal tone, unbalanced muscle activation and decreased coordination, decreased attention, decreased awareness, decreased problem solving, decreased safety awareness, decreased memory and delayed processing and decreased standing balance, decreased postural control, hemiplegia and decreased balance strategies and therefore will continue to benefit from skilled OT intervention to enhance overall performance with BADL and Reduce care partner  burden.  Patient progressing toward long term goals..  Continue plan of care.  OT Short Term Goals Week 3:  OT Short Term Goal 1 (Week 3): Pt will complete UB dressing with min assist for two consecutive sessions. OT Short Term Goal 2 (Week 3): Pt will complete LB bathing with min assist sit to stand for two consecutive sessions. OT Short Term Goal 3 (Week 3): Pt will use the LUE for washing the RUE with mod facilitation for two consecutive sessions. OT Short Term Goal 4 (Week 3): Pt will complete walk-in shower transfers with min assist stand pivot for 2 consecutive sessions.  Skilled Therapeutic Interventions/Progress Updates:    Pt completed stand pivot transfers to the 3:1 with use of the RW with hand splint for support.  Howard White was able to complete transfer with mod assist to ambulate from the wheelchair at the side of the bed to the 3:1 placed in the room for practice.  Howard White needed mod instructional cueing for squaring up to the surface and for taking his left hand off of the walker splint before sitting down.  Howard White needed max assist to complete simulated clothing management sit to stand.  Next, therapist applied NMES to the left forearm for facilitation of digit and wrist extensors.  Intensity was set a level 34 with PPS at 35 and pulse width at 300, on/off time at 15/5.  Howard White was able to tolerate 10 mins of active stimulation without any adverse reactions.  Finished session with min assist transfer to the bed and pt left with call button and phone in reach and safety alarm in place.    Therapy Documentation Precautions:  Precautions Precautions: Fall Precaution Comments: [name], fluctuating tone in the  LUE Restrictions Weight Bearing Restrictions: No  Pain: Pain Assessment Pain Scale: 0-10 Pain Score: 0-No pain ADL: See Care Tool Section for some details of mobility and selfcare  Therapy/Group: Individual Therapy  Saifullah Jolley OTR/L 10/17/2020, 5:01 PM

## 2020-10-18 NOTE — Progress Notes (Signed)
[facility] PHYSICAL MEDICINE & REHABILITATION PROGRESS NOTE   Subjective/Complaints:  No complaints. Happy that left hand is working somewhat.   ROS: Patient denies fever, rash, sore throat, blurred vision, nausea, vomiting, diarrhea, cough, shortness of breath or chest pain, joint or back pain, headache, or mood change.    Objective:   No results found. No results for input(s): WBC, HGB, HCT, PLT in the last 72 hours. No results for input(s): NA, K, CL, CO2, GLUCOSE, BUN, CREATININE, CALCIUM in the last 72 hours.  Intake/Output Summary (Last 24 hours) at 10/18/2020 1018 Last data filed at 10/18/2020 1011 Gross per 24 hour  Intake 520 ml  Output 1610 ml  Net -1090 ml        Physical Exam: Vital Signs Blood pressure 111/72, pulse (!) 52, temperature 97.8 F (36.6 C), temperature source Oral, resp. rate 17, height 5\' 10"  (1.778 m), weight 84.4 kg, SpO2 93 %.     Constitutional: No distress . Vital signs reviewed. HEENT: EOMI, oral membranes moist Neck: supple Cardiovascular: RRR without murmur. No JVD    Respiratory/Chest: CTA Bilaterally without wheezes or rales. Normal effort    GI/Abdomen: BS +, non-tender, non-distended Ext: no clubbing, cyanosis, or edema Psych: pleasant and cooperative Skin: No evidence of breakdown, no evidence of rash  Musc: No edema in extremities.  No tenderness in extremities. Neuro: Alert Motor: RUE/RLE: 5/5 proximal distal LUE: Shoulder abduction, elbow flexion/extension 4-/5, handgrip 4-/5 with flexor syndergy Brunnstrom 4 Wrist ext 2-, finger ext 3/5 Left lower extremity: 4 -/5 proximal distal  Assessment/Plan: 1. Functional deficits which require 3+ hours per day of interdisciplinary therapy in a comprehensive inpatient rehab setting.  Physiatrist is providing close team supervision and 24 hour management of active medical problems listed below.  Physiatrist and rehab team continue to assess barriers to discharge/monitor patient  progress toward functional and medical goals  Care Tool:  Bathing    Body parts bathed by patient: Right arm, Face, Left arm, Chest, Abdomen, Front perineal area, Right upper leg, Left upper leg, Right lower leg, Left lower leg   Body parts bathed by helper: Buttocks     Bathing assist Assist Level: Minimal Assistance - Patient > 75%     Upper Body Dressing/Undressing Upper body dressing   What is the patient wearing?: Pull over shirt    Upper body assist Assist Level: Moderate Assistance - Patient 50 - 74%    Lower Body Dressing/Undressing Lower body dressing      What is the patient wearing?: Incontinence brief     Lower body assist Assist for lower body dressing: Maximal Assistance - Patient 25 - 49%     Toileting Toileting    Toileting assist Assist for toileting: Moderate Assistance - Patient 50 - 74%     Transfers Chair/bed transfer  Transfers assist     Chair/bed transfer assist level: Minimal Assistance - Patient > 75% (stand pivot)     Locomotion Ambulation   Ambulation assist      Assist level: Moderate Assistance - Patient 50 - 74% Assistive device: No Device Max distance: 168ft   Walk 10 feet activity   Assist     Assist level: Moderate Assistance - Patient - 50 - 74% Assistive device: No Device   Walk 50 feet activity   Assist Walk 50 feet with 2 turns activity did not occur: Safety/medical concerns  Assist level: Moderate Assistance - Patient - 50 - 74% Assistive device: No Device    Walk 150  feet activity   Assist Walk 150 feet activity did not occur: Safety/medical concerns  Assist level: 2 helpers Assistive device: Lite Gait    Walk 10 feet on uneven surface  activity   Assist Walk 10 feet on uneven surfaces activity did not occur: Safety/medical concerns         Wheelchair     Assist Will patient use wheelchair at discharge?: Yes Type of Wheelchair: Manual    Wheelchair assist level: Moderate  Assistance - Patient 50 - 74% Max wheelchair distance: 100    Wheelchair 50 feet with 2 turns activity    Assist        Assist Level: Moderate Assistance - Patient 50 - 74%   Wheelchair 150 feet activity     Assist      Assist Level: Maximal Assistance - Patient 25 - 49%   Blood pressure 111/72, pulse (!) 52, temperature 97.8 F (36.6 C), temperature source Oral, resp. rate 17, height 5\' 10"  (1.778 m), weight 84.4 kg, SpO2 93 %.    Medical Problem List and Plan: 1.  Left-sided hemiparesis, now with spasticity and facial droop secondary to acute infarct right basal ganglia/corona radiata on 09/26/2020 as well as history of astrocytoma with left frontal craniotomy resection 20 years ago  Continue CIR-    WHO nightly   Baclofen 5mg  TID. 2.  Antithrombotics: -DVT/anticoagulation: SCDs             -antiplatelet therapy: Aspirin 81 mg daily and Plavix 75 mg daily x3 weeks then aspirin alone- Plan to D/C Plavix today 12/4 3. Pain Management: Tylenol as needed.  4. Mood: Lexapro 10 mg daily.  Provide emotional support             -antipsychotic agents: N/A 5. Neuropsych: This patient is capable of making decisions on his own behalf. 6. Skin/Wound Care: Routine skin checks  Santyl to wound 7. Fluids/Electrolytes/Nutrition:  8.  Seizure disorder.    Continue Lamictal 225 mg daily and 300 mg nightly  No seizure since rehab admission  9.  Hyperlipidemia.  LDL 131 on 11/13. Continue Lipitor 10. Hypothyroidism: Synthroid 11.  Leukocytosis: resolved last WBC 11/29 6.5K    Afebrile 12.  Bladder incontinence improved, still with occ urge incont 13.  Bowel incont improved   14.  Sleep is poor cont melatonin increase to 6mg   Improved LOS: 18 days A FACE TO [place] 10/18/2020, 10:18 AM

## 2020-10-19 ENCOUNTER — Encounter (HOSPITAL_COMMUNITY): Payer: PPO | Admitting: Speech Pathology

## 2020-10-19 ENCOUNTER — Encounter (HOSPITAL_COMMUNITY): Payer: PPO

## 2020-10-19 ENCOUNTER — Ambulatory Visit (HOSPITAL_COMMUNITY): Payer: PPO

## 2020-10-19 NOTE — Progress Notes (Signed)
Speech Language Pathology Daily Session Note  Patient Details  Name: Howard White MRN: [id number] Date of Birth: [birth year]  Today's Date: 10/19/2020 SLP Individual Time: 0737-1062 SLP Individual Time Calculation (min): 34 min  Short Term Goals: Week 3: SLP Short Term Goal 1 (Week 3): Patient will achieve 90% or greater speech intelligibility at conversational level with supervision A cues for speech strategy use SLP Short Term Goal 2 (Week 3): Patient will utilize external memory aides for orientation to time, recall of recent events (therapy sessions, etc) with minA to initiate use. SLP Short Term Goal 3 (Week 3): Patient will complete mild complex sequencing, organizing, categorizing tasks with 85% accuracy and minA cues. SLP Short Term Goal 4 (Week 3): Patient will demonstrate anticipatory awareness to deficits and impact on function as well as discharge plan (home with parents) with min A cues.  Skilled Therapeutic Interventions:  Pt was seen for skilled ST targeting family education in preparation of anticipated 12/15 discharge date.  [name] and sister were present for the majority of today's therapy session and remained actively engaged in all training tasks.  SLP provided skilled education regarding pt's current progress and goals of treatment.  Pt was able to verbalize his speech intelligibility strategies with use of memory aid posted in his room.  SLP discussed memory compensatory strategies given pt's and family's reports of "short term memory" challenges.  Education emphasized use of written aids, maintaining a consistent routine, and minimizing distractions during tasks and a handout was provided to facilitate carryover in the home environment.  Pt's family presented with questions regarding equipment needs post discharge and SLP directed them to PT as she arrived for scheduled treatment session.  All speech therapy related questions were answered to their satisfaction at this  time.  Continue per current plan of care.    Pain Pain Assessment Pain Scale: 0-10 Pain Score: 0-No pain  Therapy/Group: Individual Therapy  [name], Selinda Orion 10/19/2020, 12:47 PM

## 2020-10-19 NOTE — Progress Notes (Signed)
Physical Therapy Session Note  Patient Details  Name: Howard White MRN: [id number] Date of Birth: 11/10/1961  Today's Date: 10/19/2020 PT Individual Time: 9211-9417 PT Individual Time Calculation (min): 46 min   Short Term Goals: Week 3:  PT Short Term Goal 1 (Week 3): Pt will propell WC 163ft with min assist PT Short Term Goal 2 (Week 3): Patient will complete bed mobility with supervision PT Short Term Goal 3 (Week 3): Patient will ambulate 55ft with MinA and LRAD for household distances  Skilled Therapeutic Interventions/Progress Updates:     Patient in bed with SLP completing family education with patient's sister and mother upon PT arrival. Patient alert and agreeable to PT session. Patient denied pain during session. Patient requesting to void using urinal at beginning of session. Required total A for placement and peri-care behind privacy curtain between patient and family for modesty.   Patient's mother and sister provided PT with a sheet with questions and measurements, placed on patient's bathroom door for other therapists. Report patient has 1 STE in front and level entry through garage (however, longer walking distance to bedroom/living room from here). Reports the bed on the ground floor is 32" high and expressed concerns about safety at this height. Open to hospital bed to be set-up in the sun room on the ground floor for safe mobility. Will determine need for hospital bed based on patient's progress. Discussed follow-up services for [facility] versus OP therapies, report patient's mother is having to care for her husband and the patient full time at d/c and the family have concerns about safety with car transfers/community mobility. Family also asking about w/c in community at d/c. Educated family on patient's on current functional status, progress with mobility, and goals. Also, informed them that equipment needs will be determined based on progress with therapy. Patient and his family very  attentive and appreciative of discussion and education.   Remainder of session focused on demonstration of patient's current level of functional mobility.   Therapeutic Activity: Bed Mobility: Patient performed supine to sit with supervision in a flat bed without use of bed rails with increased effort and use of momentum to come to sitting. Provided verbal cues for controlled movement and performing rolling to side-lying to improve safety with mobility. Patient sat EOB with bed elevated to 32" with patient's feet dangling from the floor. Patient demonstrated poor trunk control and safety awareness when attempting to scoot EOB at this height. Lowered the bed all the way day for improved patient safety to scoot EOB. Patient sat EOB with supervision to don a shirt with min A and cues for hemi-technique and pants, socks, and shoes with max-total A for energy/time management.  Transfers: Patient performed sit to/from stand with mod A x1 due to L lean, provided RW and patient performed sit to/from stand with min A-CGA as PT pulled his pants over his hips with total A. He performed stand pivot bed<>w/c and sit to/from stand x1 with min A without AD. Provided verbal cues for controlled stepping maintaining midline throughout transfers, and reaching back to sit. Patient performed a simulated sedan height car transfer with min-mod A due to minor L lean during stand pivot without AD. Provided cues for safe technique and sequencing.  Gait Training:  Patient ambulated >50 feet without AD with min-mod A. Ambulated with decreased L knee/hip flexion during swing with compensatory R trunk lean, required reset x2 due to poor L foot clearance with mild LOB. Provided verbal cues for erect posture,  increased hamstring activation in pre-swing, and increased attention to foot placement during turns.  Wheelchair Mobility:  Patient was transported in the w/c with total A throughout session for energy conservation and time  management.  Patient mildly impulsive throughout session. Educated patient and family on safety concerns and strategies/cues for management of impulsivity with mobility.   Patient in w/c with his sister and mother in the room awaiting OT arrival for continued family education at end of session with breaks locked and all needs within reach.    Therapy Documentation Precautions:  Precautions Precautions: Fall Precaution Comments: [name], fluctuating tone in the LUE Restrictions Weight Bearing Restrictions: No   Therapy/Group: Individual Therapy  Cynthea Zachman L Quinesha Selinger PT, DPT  10/19/2020, 5:19 PM

## 2020-10-19 NOTE — Progress Notes (Signed)
Occupational Therapy Session Note  Patient Details  Name: Howard White MRN: [id number] Date of Birth: 10-13-1961  Today's Date: 10/19/2020 OT Individual Time: 1030-1110 OT Individual Time Calculation (min): 40 min    Short Term Goals: Week 3:  OT Short Term Goal 1 (Week 3): Pt will complete UB dressing with min assist for two consecutive sessions. OT Short Term Goal 2 (Week 3): Pt will complete LB bathing with min assist sit to stand for two consecutive sessions. OT Short Term Goal 3 (Week 3): Pt will use the LUE for washing the RUE with mod facilitation for two consecutive sessions. OT Short Term Goal 4 (Week 3): Pt will complete walk-in shower transfers with min assist stand pivot for 2 consecutive sessions.  Skilled Therapeutic Interventions/Progress Updates:    Pt received sitting in the w/c with no c/o pain. Sister and mother present for family edu. Pt was taken to the ADL apt to demo TTB use. Discussed home set up and safety especially. Pt completed squat pivot transfer to the TTB with mod A, slightly impulsive but following commands well for safety. Pt returned to his w/c, discussed squat vs stand pivot and safety implications of both. Pt was taken back to his room where he requested to use urinal. Pt completed toileting seated with min A overall to manage urinal. All family questions answered within time constraints. Discussed equipment recommendations. Pt was left sitting up with all needs met, chair alarm set.   Therapy Documentation Precautions:  Precautions Precautions: Fall Precaution Comments: [name], fluctuating tone in the LUE Restrictions Weight Bearing Restrictions: No   Therapy/Group: Individual Therapy  Curtis Sites 10/19/2020, 8:02 AM

## 2020-10-20 ENCOUNTER — Inpatient Hospital Stay (HOSPITAL_COMMUNITY): Payer: PPO | Admitting: Occupational Therapy

## 2020-10-20 ENCOUNTER — Inpatient Hospital Stay (HOSPITAL_COMMUNITY): Payer: PPO

## 2020-10-20 ENCOUNTER — Inpatient Hospital Stay (HOSPITAL_COMMUNITY): Payer: PPO | Admitting: Speech Pathology

## 2020-10-20 NOTE — Progress Notes (Signed)
[place] PHYSICAL MEDICINE & REHABILITATION PROGRESS NOTE   Subjective/Complaints:  C/o nsg dis not complete diaper change this am   ROS: Patient deniesCP, SOB, N/V/D   Objective:   No results found. No results for input(s): WBC, HGB, HCT, PLT in the last 72 hours. No results for input(s): NA, K, CL, CO2, GLUCOSE, BUN, CREATININE, CALCIUM in the last 72 hours.  Intake/Output Summary (Last 24 hours) at 10/20/2020 1021 Last data filed at 10/20/2020 0700 Gross per 24 hour  Intake 794 ml  Output 925 ml  Net -131 ml        Physical Exam: Vital Signs Blood pressure 106/74, pulse (!) 56, temperature 97.7 F (36.5 C), temperature source Oral, resp. rate 18, height 5\' 10"  (1.778 m), weight 84.4 kg, SpO2 94 %.   General: No acute distress Mood and affect are appropriate Heart: Regular rate and rhythm no rubs murmurs or extra sounds Lungs: Clear to auscultation, breathing unlabored, no rales or wheezes Abdomen: Positive bowel sounds, soft nontender to palpation, nondistended Extremities: No clubbing, cyanosis, or edema Skin: No evidence of breakdown, no evidence of rash  Musc: No edema in extremities.  No tenderness in extremities. Neuro: Alert Motor: RUE/RLE: 5/5 proximal distal LUE: Shoulder abduction, elbow flexion/extension 4-/5, handgrip 4-/5 with flexor syndergy Brunnstrom 4 Wrist ext 2-, finger ext 3/5 Left lower extremity: 4 -/5 proximal distal  Assessment/Plan: 1. Functional deficits which require 3+ hours per day of interdisciplinary therapy in a comprehensive inpatient rehab setting.  Physiatrist is providing close team supervision and 24 hour management of active medical problems listed below.  Physiatrist and rehab team continue to assess barriers to discharge/monitor patient progress toward functional and medical goals  Care Tool:  Bathing    Body parts bathed by patient: Right arm, Face, Left arm, Chest, Abdomen, Front perineal area, Right upper leg, Left  upper leg, Right lower leg, Left lower leg   Body parts bathed by helper: Buttocks     Bathing assist Assist Level: Minimal Assistance - Patient > 75%     Upper Body Dressing/Undressing Upper body dressing   What is the patient wearing?: Pull over shirt    Upper body assist Assist Level: Moderate Assistance - Patient 50 - 74%    Lower Body Dressing/Undressing Lower body dressing      What is the patient wearing?: Incontinence brief     Lower body assist Assist for lower body dressing: Maximal Assistance - Patient 25 - 49%     Toileting Toileting    Toileting assist Assist for toileting: Moderate Assistance - Patient 50 - 74%     Transfers Chair/bed transfer  Transfers assist     Chair/bed transfer assist level: Moderate Assistance - Patient 50 - 74%     Locomotion Ambulation   Ambulation assist      Assist level: Moderate Assistance - Patient 50 - 74% Assistive device: No Device Max distance: 50 ft   Walk 10 feet activity   Assist     Assist level: Moderate Assistance - Patient - 50 - 74% Assistive device: No Device   Walk 50 feet activity   Assist Walk 50 feet with 2 turns activity did not occur: Safety/medical concerns  Assist level: Moderate Assistance - Patient - 50 - 74% Assistive device: No Device    Walk 150 feet activity   Assist Walk 150 feet activity did not occur: Safety/medical concerns  Assist level: 2 helpers Assistive device: Lite Gait    Walk 10 feet on  uneven surface  activity   Assist Walk 10 feet on uneven surfaces activity did not occur: Safety/medical concerns         Wheelchair     Assist Will patient use wheelchair at discharge?: Yes Type of Wheelchair: Manual    Wheelchair assist level: Moderate Assistance - Patient 50 - 74% Max wheelchair distance: 100    Wheelchair 50 feet with 2 turns activity    Assist        Assist Level: Moderate Assistance - Patient 50 - 74%   Wheelchair 150  feet activity     Assist      Assist Level: Maximal Assistance - Patient 25 - 49%   Blood pressure 106/74, pulse (!) 56, temperature 97.7 F (36.5 C), temperature source Oral, resp. rate 18, height 5\' 10"  (1.778 m), weight 84.4 kg, SpO2 94 %.    Medical Problem List and Plan: 1.  Left-sided hemiparesis, now with spasticity and facial droop secondary to acute infarct right basal ganglia/corona radiata on 09/26/2020 as well as history of astrocytoma with left frontal craniotomy resection 20 years ago  Continue CIR-    WHO nightly   Baclofen 5mg  TID. 2.  Antithrombotics: -DVT/anticoagulation: SCDs             -antiplatelet therapy: Aspirin 81 mg daily and Plavix 75 mg daily x3 weeks then aspirin alone-Off  Plavix  3. Pain Management: Tylenol as needed.  4. Mood: Lexapro 10 mg daily.  Provide emotional support             -antipsychotic agents: N/A 5. Neuropsych: This patient is capable of making decisions on his own behalf. 6. Skin/Wound Care: Routine skin checks  Santyl to wound 7. Fluids/Electrolytes/Nutrition:  8.  Seizure disorder.    Continue Lamictal 225 mg daily and 300 mg nightly  No seizure since rehab admission  9.  Hyperlipidemia.  LDL 131 on 11/13. Continue Lipitor 10. Hypothyroidism: Synthroid 11.  Leukocytosis: resolved last WBC 11/29 6.5K    Afebrile 12.  Bladder incontinence improved, still with occ urge incont, trial low dose ditropan 13.  Bowel incont improved   14.  Sleep is poor cont melatonin increase to 6mg   Improved LOS: 20 days A FACE TO FACE EVALUATION WAS PERFORMED  Charlett Blake 10/20/2020, 10:21 AM

## 2020-10-20 NOTE — Progress Notes (Signed)
Physical Therapy Session Note  Patient Details  Name: Howard White MRN: [id number] Date of Birth: 1961/09/21  Today's Date: 10/20/2020 PT Individual Time: 1430-1526 PT Individual Time Calculation (min): 56 min   Short Term Goals: Week 3:  PT Short Term Goal 1 (Week 3): Pt will propell WC 145ft with min assist PT Short Term Goal 2 (Week 3): Patient will complete bed mobility with supervision PT Short Term Goal 3 (Week 3): Patient will ambulate 43ft with MinA and LRAD for household distances  Skilled Therapeutic Interventions/Progress Updates:    Patient received supine in bed, agreeable to PT. He denies pain. Reports that he already urinated in brief and needs to get cleaned up. Patient able to come to sit edge of bed with CGA and no bed rail assist. MinA ambulating into bathroom with HHA. Patient able to continue voiding into toilet, but was unable to have a bowel movement. Patient very receptive to consistent cuing for slower and intentional movements. Patient able to complete perihygiene with MinA for postural stability. Patient donning brief/pants with ModA. Patient ambulating to sink with ModA HHA and complete hand hygiene standing at sink with CGA for postural stability. PT propelling patient in wc to therapy gym for time management and energy conservation. He was able to ambulate 41ft x2 with RW + modified hand grip + MinA. Intermittent verbal cues needed for improved L foot clearance. After ~35ft ft, patient complaining of L hand pain on modified grip. Patient hand found to have tightened grip and began to supinate on modified grip. Patient continues to recruit excessive muscle groups in L UE/ LE when concentrating on task resulting in L hand pain when ambulating. Patient responsive to verbal cues to relax L hand and continue walking. No significant LOB noted. Patient improving coordination of turns to sit in chair safely. Improving safety awareness and minimal impulsivity noted with consistent  verbal cuing. PT providing patient with standard wc, like he will be using at home, to begin practicing wc mobility with it. He continues to require up to ModA/MaxA for wc mobility using hemi-technique due to limited ability to coordinate UE/LE. Patient returning to room, transferring to bed via stand pivot with MinA. MinA sit > supine. Bed alarm on, call light within reach.   Therapy Documentation Precautions:  Precautions Precautions: Fall Precaution Comments: [name], fluctuating tone in the LUE Restrictions Weight Bearing Restrictions: No    Therapy/Group: Individual Therapy  Karoline Caldwell, PT, DPT, CBIS  10/20/2020, 7:48 AM

## 2020-10-20 NOTE — Progress Notes (Addendum)
Occupational Therapy Session Note  Patient Details  Name: Howard White MRN: [id number] Date of Birth: 03/14/1961  Today's Date: 10/20/2020 OT Individual Time: 4944-9675 OT Individual Time Calculation (min): 41 min    Short Term Goals: Week 3:  OT Short Term Goal 1 (Week 3): Pt will complete UB dressing with min assist for two consecutive sessions. OT Short Term Goal 2 (Week 3): Pt will complete LB bathing with min assist sit to stand for two consecutive sessions. OT Short Term Goal 3 (Week 3): Pt will use the LUE for washing the RUE with mod facilitation for two consecutive sessions. OT Short Term Goal 4 (Week 3): Pt will complete walk-in shower transfers with min assist stand pivot for 2 consecutive sessions.  Skilled Therapeutic Interventions/Progress Updates:    Pt completed supine to sit EOB with supervision and then donned his shoes with supervision on the EOB.  Min assist was needed for stand pivot transfer to the wheelchair with mod instructional cueing for slow movements and upright posture.  He was taken down via wheelchair to the therapy gym where he completed another transfer to the therapy mat with mod assist.  Increased impulsivity noted with sit to stand resulting in decreased forward weightshift resulting in posterior lean and LOB at times.  On the mat therapist applied NMES to the dorsal left forearm for facilitation of digit extensors with wrist extension.  Pt still demonstrates increased flexor synergy with attempted movement of the LUE.  NMES intensity was set at level 37 with PPS at 35 and pulse width at 300.  Time on/off was 15 seconds on and 5 seconds off.  Pt was instructed to work on elbow extension for simulated functional reach while stimulation was active.  He continued to demonstrate moderate overcompensation when attempting task and tries to extend his trunk, RLE, and RUE to assist with opening of the digits.  This in turn increases the synergy movement and increases  digit flexion.  Graded task based on response with focus on trying to get pt to only work on small movements with facilitation of digit extension along with the stimulation.  Pt still with great difficulty relaxing his arm and moving it into and out of shoulder flexion and elbow flexion/extension without moderate overcompensation.  He tolerated 12 mins of active stimulation without any adverse reactions.  Finished session with transfer back to the wheelchair and return to the room at the previous levels of assist.   Once in the bed pt requested use of the urinal but was unable to manage it himself, requiring max assist from therapist and pt demonstrating some bladder incontinence.  Worked on donning new brief and shorts in the bed at total assist secondary to time limitations.  Pt left with the call button and phone in reach and safety belt in place.    Therapy Documentation Precautions:  Precautions Precautions: Fall Precaution Comments: [name], fluctuating tone in the LUE Restrictions Weight Bearing Restrictions: No  Pain: Pain Assessment Pain Scale: Faces Pain Score: 0-No pain ADL: See Care Tool Section for some details of mobility and selfcare  Therapy/Group: Individual Therapy  Raylen Ken OTR/L 10/20/2020, 5:06 PM

## 2020-10-20 NOTE — Progress Notes (Signed)
   10/20/20 2015  What Happened  Was fall witnessed? No  Was patient injured? No  Patient found on floor  Found by Staff-comment Coralyn Gerod RN)  Stated prior activity bathroom-unassisted  Follow Up  MD notified Reesa Chew PA  Time MD notified 2020  Family notified Yes - comment Heaven Wandell)  Time family notified 2025  Additional tests No  Progress note created (see row info) Yes  Adult Fall Risk Assessment  Risk Factor Category (scoring not indicated) High fall risk per protocol (document High fall risk)  Patient Fall Risk Level High fall risk  Adult Fall Risk Interventions  Required Bundle Interventions *See Row Information* High fall risk - low, moderate, and high requirements implemented  Additional Interventions Use of appropriate toileting equipment (bedpan, BSC, etc.);Lap belt while in chair/wheelchair  Screening for Fall Injury Risk (To be completed on HIGH fall risk patients) - Assessing Need for Low Bed  Risk For Fall Injury- Low Bed Criteria Previous fall this admission  Will Implement Low Bed and Floor Mats Yes  Screening for Fall Injury Risk (To be completed on HIGH fall risk patients who do not meet crieteria for Low Bed) - Assessing Need for Floor Mats Only  Risk For Fall Injury- Criteria for Floor Mats None identified - No additional interventions needed  Will Implement Floor Mats Yes  Oxygen Therapy  O2 Device Room Air  Pain Assessment  Pain Scale 0-10  Pain Score 0  PCA/Epidural/Spinal Assessment  Respiratory Pattern Regular;Unlabored  Neurological  Neuro (WDL) X  Level of Consciousness Alert  Orientation Level Oriented X4  Cognition Follows commands;Impulsive;Poor safety awareness;Poor judgement  Speech Clear  Pupil Assessment  No  R Hand Grip Moderate  L Hand Grip Weak   R Foot Dorsiflexion Moderate  L Foot Dorsiflexion Weak  Glasgow Coma Scale  Eye Opening 4  Best Verbal Response (NON-intubated) 5  Best Motor Response 6  Glasgow Coma Scale  Score 15  Musculoskeletal  Musculoskeletal (WDL) X  Assistive Device Wheelchair  Generalized Weakness Yes  Weight Bearing Restrictions No  Integumentary  Integumentary (WDL) X  Skin Color Appropriate for ethnicity  Skin Condition Dry  Skin Integrity Abrasion  Abrasion Location Knee  Abrasion Location Orientation Left  Abrasion Intervention Foam  Skin Turgor Non-tenting  Pain Screening  Clinical Progression Not changed

## 2020-10-20 NOTE — Progress Notes (Addendum)
Occupational Therapy Session Note  Patient Details  Name: Howard White MRN: [id number] Date of Birth: 06/14/1961  Today's Date: 10/20/2020 OT Individual Time: 1001-1057 OT Individual Time Calculation (min): 56 min    Short Term Goals: Week 3:  OT Short Term Goal 1 (Week 3): Pt will complete UB dressing with min assist for two consecutive sessions. OT Short Term Goal 2 (Week 3): Pt will complete LB bathing with min assist sit to stand for two consecutive sessions. OT Short Term Goal 3 (Week 3): Pt will use the LUE for washing the RUE with mod facilitation for two consecutive sessions. OT Short Term Goal 4 (Week 3): Pt will complete walk-in shower transfers with min assist stand pivot for 2 consecutive sessions.  Skilled Therapeutic Interventions/Progress Updates:    Pt received sup in bed, requesting to shower, agreeable to therapy.  Functional Mobility/Transfers: Side roll to R, sup > sit EOB with no bed rail + close supervision 2/2 impaired sitting balance. Stand-pivot transfers from EOB to w/c with mod A to block LLE. Demonstrates slow improvement to move slowly with transfers to avoid LOB but cont to req max VCs.  Dressing: Seated on TTB Doffed T shirt with close supervision. Doffed brief with mod A to undo ties and for sit<>stand. Seated in w/c, donned long-sleeved shirt with max A for hemi-technique + thread BUE + pull shirt down in back. Donned brief + pants with max A to thread BLE + pull-up over hips + to block LLE during. Sit<>stand. Donned TEDs with total A and donned bilat shoes with close supervision.  Bathing: Stand-pivot from w/c to TTB with mod A to block LLE + max VCs to move slowly. Req overall min A to wash buttocks in sit<>stand (used front grab bars for initial lift) + maintain LUE grip on washcloth to wash RUE.   Toileting: Used urinal seated in w/c with min A to place initial grip with RUE on urinal.  Pt demonstrates improved ability to slow movements + improved gross  grasp of L hand but cont to be limited by impulsivity with functional mobility + subfunctional LUE use and will cont to benefit from skilled CIR OT to maximize functional mobility, ADL/IADL performance, and to reduce caregiver burden.   Pt left in w/c with safety belt alarm engaged, call bell in reach, all immediate needs met.  Therapy Documentation Precautions:  Precautions Precautions: Fall Precaution Comments: [name], fluctuating tone in the LUE Restrictions Weight Bearing Restrictions: No  Pain: no c/o pain.  ADL: See Care Tool for more details.   Therapy/Group: Individual Therapy  Volanda Napoleon MS, OTR/L Clyda Greener OTR/L  10/20/2020, 7:28 AM

## 2020-10-20 NOTE — Progress Notes (Signed)
Speech Language Pathology Daily Session Note  Patient Details  Name: Howard White MRN: [id number] Date of Birth: 07-Oct-1961  Today's Date: 10/20/2020 SLP Individual Time: 7680-8811 SLP Individual Time Calculation (min): 55 min  Short Term Goals: Week 3: SLP Short Term Goal 1 (Week 3): Patient will achieve 90% or greater speech intelligibility at conversational level with supervision A cues for speech strategy use SLP Short Term Goal 2 (Week 3): Patient will utilize external memory aides for orientation to time, recall of recent events (therapy sessions, etc) with minA to initiate use. SLP Short Term Goal 3 (Week 3): Patient will complete mild complex sequencing, organizing, categorizing tasks with 85% accuracy and minA cues. SLP Short Term Goal 4 (Week 3): Patient will demonstrate anticipatory awareness to deficits and impact on function as well as discharge plan (home with parents) with min A cues.  Skilled Therapeutic Interventions: Pt was seen for skilled ST targeting cognitive goals. SLP facilitated session with Min faded to Supervision A level verbal cues for error awareness and problem solving during a complex medication management task in which pt used list f current medications to organize a TID pill box - this is noted to be an improvement since last targeted in ST. List of medications also updated, and pt used external aid to recall med names, functions, dosages and discriminate which were new vs familiar with Min A verbal cues. Pt required overall Min A verbal cues for sustained attention to tasks and organization within tasks. Memory notebook updated prior to end of session. Pt left laying in bed with alarm set and needs within reach. Continue per current plan of care.      Pain Pain Assessment Pain Scale: 0-10 Pain Score: 0-No pain  Therapy/Group: Individual Therapy  Arbutus Leas 10/20/2020, 7:17 AM

## 2020-10-21 ENCOUNTER — Inpatient Hospital Stay (HOSPITAL_COMMUNITY): Payer: PPO | Admitting: Occupational Therapy

## 2020-10-21 ENCOUNTER — Inpatient Hospital Stay (HOSPITAL_COMMUNITY): Payer: PPO | Admitting: Physical Therapy

## 2020-10-21 ENCOUNTER — Inpatient Hospital Stay (HOSPITAL_COMMUNITY): Payer: PPO | Admitting: Speech Pathology

## 2020-10-21 NOTE — Progress Notes (Signed)
Physical Therapy Weekly Progress Note  Patient Details  Name: Howard White MRN: [id number] Date of Birth: 02-12-1961  Beginning of progress report period: October 15, 2020 End of progress report period: October 21, 2020  Today's Date: 10/21/2020 PT Individual Time: 1108-1208 and 3785-8850 PT Individual Time Calculation (min): 60 min and 33 min  Patient has met 3 of 3 short term goals. [name] is progressing well with therapy demonstrating increasing independence with functional mobility and improved dual-task abilities. He is performing bed mobility with CGA/supervision, sit<>stands and stand pivot transfers with min/mod assist for balance, and ambulating up to 170f without AD and min/mod assist for balance. During gait training pt demonstrates decreased L LE hip/knee flexion with R lateral trunk lean compensation to improve foot clearance. Pt continues to demonstrate decreased safety awareness along with quick, impulsive movements, and impaired balance that result in increased risk for falls. Mr. NDennardremains extremely motivated to participate in therapy and increase his independence, recommend continued CIR level therapies.   Patient continues to demonstrate the following deficits muscle weakness and muscle joint tightness, decreased cardiorespiratoy endurance, impaired timing and sequencing, abnormal tone, unbalanced muscle activation, motor apraxia, decreased coordination and decreased motor planning, decreased midline orientation, decreased initiation, decreased attention, decreased awareness, decreased problem solving, decreased safety awareness, decreased memory and delayed processing and decreased sitting balance, decreased standing balance, decreased postural control, hemiplegia and decreased balance strategies and therefore will continue to benefit from skilled PT intervention to increase functional independence with mobility.  Patient progressing toward long term goals.  Continue  plan of care.  PT Short Term Goals Week 3:  PT Short Term Goal 1 (Week 3): Pt will propell WC 1073fwith min assist PT Short Term Goal 1 - Progress (Week 3): Met PT Short Term Goal 2 (Week 3): Patient will complete bed mobility with supervision PT Short Term Goal 2 - Progress (Week 3): Met PT Short Term Goal 3 (Week 3): Patient will ambulate 3025fith MinA and LRAD for household distances PT Short Term Goal 3 - Progress (Week 3): Met Week 4:  PT Short Term Goal 1 (Week 4): STGs = to LTGs based on ELOS  Skilled Therapeutic Interventions/Progress Updates:  Ambulation/gait training;Balance/vestibular training;Cognitive remediation/compensation;Community reintegration;Discharge planning;Disease management/prevention;DME/adaptive equipment instruction;Functional electrical stimulation;Functional mobility training;Neuromuscular re-education;Pain management;Patient/family education;Psychosocial support;Skin care/wound management;Splinting/orthotics;Therapeutic Activities;Therapeutic Exercise;Stair training;UE/LE Coordination activities;UE/LE Strength taining/ROM;Visual/perceptual remediation/compensation;Wheelchair propulsion/positioning    Session 1: Pt received R sidelying in bed asleep and easily awakens and agreeable to therapy session. Supine>sitting R EOB, HOB slightly elevated and using bedrail, with supervision. Sitting EOB with supervision for sitting balance donned shoes max assist for time management. L stand pivot to w/c with min assist for balance - demos decreased L lateral step length during transfer. Transported to/from gym in w/c for time management and energy conservation. Gait training ~22f40fo AD, with min assist for balance, intermittent mod assist if becomes fatigued and not clearing L foot fully - continues to demo lack of L LE hip/knee flexion activation during swing with R lateral trunk lean/flexion compensation, though improving. Gait training ~120ft3fh pt holding onto canes that  2nd person in front of him is holding in order to facilitate reciprocal arm swing and promote consistent gait speed as well as provide visual demonstration of normalized gait mechanics - min/mod assist for balance - demos slight improvement in gait mechanics in the beginning but then unable to sustain returning to above noted impairments. Donned L flat hand paddle to assist with flexor tone management  during gait. Stepped on/off treadmill with R UE support on bar and heavy min assist for balance - cuing for sequencing of stepping. Standing on treadmill with R UE support donned harness.   Performed the following locomotor treadmill training trials using R UE support on handrail and harness for safety but no real BWS.  Trial 1: 10mn17sec at 0.524m increased to 0.73m42mtotaling 67f43frequired mod assist for facilitating R/L weight shift and facilitating increased L hip flexion during swing Trial 2: 2min72mec at 0.73mph 473maling 160ft -66fempted to add small bean bag targets to promote increased L LE hip/knee flexion and longer step lengths while continuing facilitation at hips but pt had difficulty processing this additional stimulus therefore discontinued bean bags Trial 3: 1min3se29mt 0.73mph tot50mng 64ft - co48fued mod facilitation at hips primarily noticing pt requires facilitation to increase R weight shift during R stance phase along with facilitation for increased L hip/knee flexion, also cuing for increased upright trunk/head posture   Trial 4: 1min28sec 51m0.73mph totali14m89ft - conti17f cuing/facilitation as just stated  Pt noticeably becomes fatigued much more quickly during gait training on treadmill compared to overground, anticipate due to promoting faster gait speed along with pt being unable to stop and "reset" as he does during overground.  Gait training ~100ft back to 16f, no AD, with min and more frequent mod assist due to fatigue - pt demoing improving posture, R weight shift, and L LE  foot clearance after treadmill training but with fatigue unable to sustain. Pt agreeable to remain seated in w/c for lunch - left with needs in reach, seat belt alarm on, and L UE supported on 1/2 lap tray.    Session 2: Pt received supine in bed and agreeable to therapy session. Assessed L LE spasticity with following scores on Modified Ashworth Scale: quadriceps 1+ and hamstrings 2 - anticipate this may be contributing to impaired gait mechanics. Supine>sitting R EOB, HOB slightly elevated and using bedrail, with supervision. Therapist educated pt on recommendation of standard wheelchair at discharge based on pt's CLOF and estimated continued recovery. Throughout session reinforced a "check-off" list for pt to ensure he is safe and ready to come to stand that included: 1. Scooting forward 2. Getting feet planted on ground 3. Ensuring person assisting him is ready for him to stand Gait training ~140ft, ~45ft, n62f, wi67fin assist, few instances mod assist, for balance - continues to demo R antero-lateral trunk flexion compensation during L LE swing phase due to decreased L LE hip/knee flexion causing foot to scuff floor during swing - therapist facilitating L lateral trunk flexion throughout gait to improve midline orientation, which results in decreased L LE foot clearance but cuing for improvement - continues to stop and "reset" when gait impairments worsen. Dynamic standing balance and L LE NMR with R HHA via L LE stepping forward/backwards over yoga block while in staggered stance with R LE forward to target L hamstring activation from a hip extended position to replicate toe-off phase of gait - cuing throughout for sustained trunk upright/midline position. Gait training ~120ft back to roo12fo AD, with continued min/mod assist for balance and sequential cuing for carryover of increased L LE hip/knee flexion during swing while sustaining trunk in midline with minimal improvement noted. Pt requesting to  return to bed and rest. Sit>supine with supervision. Pt left supine in bed with needs in reach and bed alarm on.   Therapy Documentation Precautions:  Precautions Precautions: Fall Precaution Comments:  [name], fluctuating tone in the LUE Restrictions Weight Bearing Restrictions: No  Pain:   Session 1: No reports of pain throughout session.  Session 2: No reports of pain throughout session.  Therapy/Group: Individual Therapy  Tawana Scale , PT, DPT, CSRS  10/21/2020, 7:57 AM

## 2020-10-21 NOTE — Progress Notes (Signed)
Speech Language Pathology Weekly Progress and Session Note  Patient Details  Name: Howard White MRN: [id number] Date of Birth: 03-Jun-1961  Beginning of progress report period: October 14, 2020 End of progress report period: October 21, 2020  Today's Date: 10/21/2020 SLP Individual Time: 6803-2122 SLP Individual Time Calculation (min): 55 min  Short Term Goals: Week 3: SLP Short Term Goal 1 (Week 3): Patient will achieve 90% or greater speech intelligibility at conversational level with supervision A cues for speech strategy use SLP Short Term Goal 1 - Progress (Week 3): Met SLP Short Term Goal 2 (Week 3): Patient will utilize external memory aides for orientation to time, recall of recent events (therapy sessions, etc) with minA to initiate use. SLP Short Term Goal 2 - Progress (Week 3): Met SLP Short Term Goal 3 (Week 3): Patient will complete mild complex sequencing, organizing, categorizing tasks with 85% accuracy and minA cues. SLP Short Term Goal 3 - Progress (Week 3): Met SLP Short Term Goal 4 (Week 3): Patient will demonstrate anticipatory awareness to deficits and impact on function as well as discharge plan (home with parents) with min A cues. SLP Short Term Goal 4 - Progress (Week 3): Met    New Short Term Goals: Week 4: SLP Short Term Goal 1 (Week 4): STG=LTG due to remaining length of stay  Weekly Progress Updates: Pt has made functional gains and met 4 out of 4 short term goals this reporting period. Pt is currently Supervision assist for due to mild cognitive impairments impacting his short term memory, complex problem solving and organization, emergent and anticipatory awareness, and attention. Pt also presents with mild dysarthria characterized mainly by mild articulatory imprecision, which he is using strategies to compensate for to increase intelligibility I conversation to 90-95% with Supervision A. Pt and family education is ongoing. Pt would continue to benefit from  skilled ST while inpatient in order to maximize functional independence and reduce burden of care prior to discharge. Anticipate that pt will need 24/7 supervision at discharge in addition to [place] follow up at next level of care.     Intensity: Minumum of 1-2 x/day, 30 to 90 minutes Frequency: 3 to 5 out of 7 days Duration/Length of Stay: 10/29/20 Treatment/Interventions: Cognitive remediation/compensation;Cueing hierarchy;Speech/Language facilitation;Internal/external aids;Patient/family education;Functional tasks   Daily Session  Skilled Therapeutic Interventions: Pt was seen for skilled ST targeting cognitive-linguistic goals. SLP facilitated session with Min faded to Supervision A level verbal and visual cues for problem solving and organizational strategies, as well as "slowing down" to increase accuracy and comprehension of written information during a complex deductive reasoning puzzle (kitchen cabinets). Min A verbal cues provided for recall within task and throughout session. Per chart review, pt with with a recent fall, therefore functional conversation targeted increasing his safety awareness and planning/antiicpating how to prevent another fall. He expressed good awareness that he should not have attempted to ambulate without staff present and without walker, however did attempt to deflect during conversation and Min A verbal cues provided for reasoning and planning. During conversation he used overarticulation to increase his speech intelligibility to ~95% with Supervision A verbal cues. Pt left laying in bed with alarm set and needs within reach. Continue per current plan of care.         Pain    Therapy/Group: Individual Therapy  Arbutus Leas 10/21/2020, 7:29 AM

## 2020-10-21 NOTE — Progress Notes (Signed)
[place] PHYSICAL MEDICINE & REHABILITATION PROGRESS NOTE   Subjective/Complaints:  Patient's mother is in the room today.  We discussed follow-up plans.  She would like home health therapy.  And then later look at outpatient therapy.  Patient had a fall while ambulating to and from the bathroom.  We discussed safety issues and this was not appropriate given his balance at this time.  Patient verbalizes understanding.  He states it was "a bad idea"  ROS: Patient deniesCP, SOB, N/V/D   Objective:   No results found. No results for input(s): WBC, HGB, HCT, PLT in the last 72 hours. No results for input(s): NA, K, CL, CO2, GLUCOSE, BUN, CREATININE, CALCIUM in the last 72 hours.  Intake/Output Summary (Last 24 hours) at 10/21/2020 0911 Last data filed at 10/21/2020 0724 Gross per 24 hour  Intake 876 ml  Output 400 ml  Net 476 ml        Physical Exam: Vital Signs Blood pressure 116/82, pulse (!) 54, temperature 97.8 F (36.6 C), resp. rate 18, height 5\' 10"  (1.778 m), weight 84.4 kg, SpO2 96 %.   General: No acute distress Mood and affect are appropriate Heart: Regular rate and rhythm no rubs murmurs or extra sounds Lungs: Clear to auscultation, breathing unlabored, no rales or wheezes Abdomen: Positive bowel sounds, soft nontender to palpation, nondistended Extremities: No clubbing, cyanosis, or edema Skin: No evidence of breakdown, no evidence of rash  Musc: No edema in extremities.  No tenderness in extremities. Neuro: Alert Motor: RUE/RLE: 5/5 proximal distal LUE: Shoulder abduction, elbow flexion/extension 4-/5, handgrip 4-/5 with flexor syndergy Brunnstrom 4 Wrist ext 2-, finger ext 3/5 Left lower extremity: 4 -/5 proximal distal  Assessment/Plan: 1. Functional deficits which require 3+ hours per day of interdisciplinary therapy in a comprehensive inpatient rehab setting.  Physiatrist is providing close team supervision and 24 hour management of active medical  problems listed below.  Physiatrist and rehab team continue to assess barriers to discharge/monitor patient progress toward functional and medical goals  Care Tool:  Bathing    Body parts bathed by patient: Right arm, Face, Left arm, Chest, Abdomen, Front perineal area, Buttocks, Right upper leg, Left upper leg, Right lower leg, Left lower leg   Body parts bathed by helper: Buttocks     Bathing assist Assist Level: Minimal Assistance - Patient > 75%     Upper Body Dressing/Undressing Upper body dressing   What is the patient wearing?: Pull over shirt    Upper body assist Assist Level: Maximal Assistance - Patient 25 - 49%    Lower Body Dressing/Undressing Lower body dressing      What is the patient wearing?: Incontinence brief, Pants     Lower body assist Assist for lower body dressing: Maximal Assistance - Patient 25 - 49%     Toileting Toileting    Toileting assist Assist for toileting: Minimal Assistance - Patient > 75%     Transfers Chair/bed transfer  Transfers assist     Chair/bed transfer assist level: Minimal Assistance - Patient > 75%     Locomotion Ambulation   Ambulation assist      Assist level: Minimal Assistance - Patient > 75% Assistive device: Walker-rolling Max distance: 40   Walk 10 feet activity   Assist     Assist level: Minimal Assistance - Patient > 75% Assistive device: Walker-rolling   Walk 50 feet activity   Assist Walk 50 feet with 2 turns activity did not occur: Safety/medical concerns  Assist  level: Moderate Assistance - Patient - 50 - 74% Assistive device: No Device    Walk 150 feet activity   Assist Walk 150 feet activity did not occur: Safety/medical concerns  Assist level: 2 helpers Assistive device: Lite Gait    Walk 10 feet on uneven surface  activity   Assist Walk 10 feet on uneven surfaces activity did not occur: Safety/medical concerns         Wheelchair     Assist Will patient  use wheelchair at discharge?: Yes Type of Wheelchair: Manual    Wheelchair assist level: Minimal Assistance - Patient > 75% Max wheelchair distance: 100    Wheelchair 50 feet with 2 turns activity    Assist        Assist Level: Minimal Assistance - Patient > 75%   Wheelchair 150 feet activity     Assist      Assist Level: Moderate Assistance - Patient 50 - 74%   Blood pressure 116/82, pulse (!) 54, temperature 97.8 F (36.6 C), resp. rate 18, height 5\' 10"  (1.778 m), weight 84.4 kg, SpO2 96 %.    Medical Problem List and Plan: 1.  Left-sided hemiparesis, now with spasticity and facial droop secondary to acute infarct right basal ganglia/corona radiata on 09/26/2020 as well as history of astrocytoma with left frontal craniotomy resection 20 years ago  Continue CIR-    WHO nightly   Baclofen 5mg  TID. 2.  Antithrombotics: -DVT/anticoagulation: SCDs             -antiplatelet therapy: Aspirin 81 mg daily and Plavix 75 mg daily x3 weeks then aspirin alone-Off  Plavix  3. Pain Management: Tylenol as needed.  4. Mood: Lexapro 10 mg daily.  Provide emotional support             -antipsychotic agents: N/A 5. Neuropsych: This patient is capable of making decisions on his own behalf. 6. Skin/Wound Care: Routine skin checks  Santyl to wound 7. Fluids/Electrolytes/Nutrition:  8.  Seizure disorder.    Continue Lamictal 225 mg daily and 300 mg nightly  No seizure since rehab admission  9.  Hyperlipidemia.  LDL 131 on 11/13. Continue Lipitor 10. Hypothyroidism: Synthroid 11.  Leukocytosis: resolved last WBC 11/29 6.5K    Afebrile 12.  Bladder incontinence improved, still with occ urge incont, trial low dose ditropan 13.  Bowel incont improved   14.  Sleep is poor cont melatonin increase to 6mg   Improved LOS: 21 days A FACE TO FACE EVALUATION WAS PERFORMED  Charlett Blake 10/21/2020, 9:11 AM

## 2020-10-21 NOTE — Progress Notes (Signed)
Occupational Therapy Session Note  Patient Details  Name: Howard White MRN: [id number] Date of Birth: [birth year]  Today's Date: 10/21/2020 OT Individual Time: 1300-1403 OT Individual Time Calculation (min): 63 min    Short Term Goals: Week 3:  OT Short Term Goal 1 (Week 3): Pt will complete UB dressing with min assist for two consecutive sessions. OT Short Term Goal 2 (Week 3): Pt will complete LB bathing with min assist sit to stand for two consecutive sessions. OT Short Term Goal 3 (Week 3): Pt will use the LUE for washing the RUE with mod facilitation for two consecutive sessions. OT Short Term Goal 4 (Week 3): Pt will complete walk-in shower transfers with min assist stand pivot for 2 consecutive sessions.  Skilled Therapeutic Interventions/Progress Updates:    Pt sitting up in the wheelchair to start session with therapy transporting him to the therapy gym for work on the LUE.  He began with therapist applying foam paddled to the left hand to assist with maintaining digit extension for weightbearing tasks.  He then transitioned to quadriped with mod assist for focus on LUE weightbearing.  He was able to work on maintaining left elbow extension while washing mat with the RUE and min guard assist.  Progressed to having him reach across midline to the left to pick up and place clothespins with the RUE while supporting himself with the LUE and min to mod assist.  Rest breaks given in tall kneeling as well as right sidelying after 2-3 mins of activity.  Transitioned to sitting with focus on active elbow extension with slight shoulder flexion using tilted stool, without overcompensation of trunk and synergy pattern.  He needed mod demonstrational cueing to relax the right side and maintain anterior pelvic tilt.  Transitioned to supine with focus on maintaining left elbow extension, wrist extension, and shoulder flexion/adduction to maintain contact with therapist's hand.  Also incorporated scapular  protraction secondary to noted scapular winging at medial border while sitting and working on pushing stool forward earlier.  Focused on avoiding adduction movements secondary to increased activation of the pectoral with all shoulder movements.  Finished session with transfer back to the wheelchair at min assist level stand pivot and return to the room, as pt needed to toilet.  Mod assist for ambulation to and from the 3:1 without an assistive device.  Mod assist also for toileting hygiene and clothing management sit to stand.  Returned to the bed at end of session with call button and phone in reach as well as safety alarm in place.  Encouraged pt to work on elbow, digit, wrist extension in bed while reaching up to the ceiling and toward his windows on the left side.     Therapy Documentation Precautions:  Precautions Precautions: Fall Precaution Comments: [name], fluctuating tone in the LUE Restrictions Weight Bearing Restrictions: No  Pain: Pain Assessment Pain Scale: 0-10 Pain Score: 0-No pain ADL: See Care Tool Section for some details of mobility and selfcare  Therapy/Group: Individual Therapy  Fin Hupp OTR/L 10/21/2020, 3:59 PM

## 2020-10-22 ENCOUNTER — Inpatient Hospital Stay (HOSPITAL_COMMUNITY): Payer: PPO | Admitting: Physical Therapy

## 2020-10-22 ENCOUNTER — Inpatient Hospital Stay (HOSPITAL_COMMUNITY): Payer: PPO | Admitting: Speech Pathology

## 2020-10-22 ENCOUNTER — Inpatient Hospital Stay (HOSPITAL_COMMUNITY): Payer: PPO | Admitting: Occupational Therapy

## 2020-10-22 ENCOUNTER — Inpatient Hospital Stay (HOSPITAL_COMMUNITY): Payer: PPO

## 2020-10-22 MED ORDER — OXYBUTYNIN CHLORIDE 5 MG PO TABS
2.5000 mg | ORAL_TABLET | Freq: Two times a day (BID) | ORAL | Status: DC
  Administered 2020-10-22 – 2020-10-24 (×5): 2.5 mg via ORAL
  Filled 2020-10-22 (×5): qty 1

## 2020-10-22 NOTE — Progress Notes (Signed)
Patient ID: Howard White, male   DOB: 1960-12-13, 58 y.o.   MRN: [id number] Team Conference Report to Patient/Family  Team Conference discussion was reviewed with the patient and caregiver, including goals, any changes in plan of care and target discharge date.  Patient and caregiver express understanding and are in agreement.  The patient has a target discharge date of 10/29/20.  [name] 10/22/2020, 12:51 PM

## 2020-10-22 NOTE — Progress Notes (Signed)
Occupational Therapy Session Note  Patient Details  Name: Howard White MRN: [id number] Date of Birth: June 19, 1961  Today's Date: 10/22/2020 OT Individual Time: 1102-1200 OT Individual Time Calculation (min): 58 min    Short Term Goals: Week 3:  OT Short Term Goal 1 (Week 3): Pt will complete UB dressing with min assist for two consecutive sessions. OT Short Term Goal 2 (Week 3): Pt will complete LB bathing with min assist sit to stand for two consecutive sessions. OT Short Term Goal 3 (Week 3): Pt will use the LUE for washing the RUE with mod facilitation for two consecutive sessions. OT Short Term Goal 4 (Week 3): Pt will complete walk-in shower transfers with min assist stand pivot for 2 consecutive sessions.  Skilled Therapeutic Interventions/Progress Updates:    Pt received sup in bed in soiled brief/shorts/sheets after episode of incont bladder/bowels with bed alarm engaged. Pt states he did not attempt to call nurse. Pt completed sup > sit EOB with close supervision, sit <> stand with min A for LLE block + mod VCs to move slowly. Doffed briefs/shorts with close supervision to get off BLE, and min A for the sit<>stand transfer. Performed anterior and posterior peri care with min A for the sit<>stand transfer + mod VCs for thoroughness of task. Doffed shirt with close supervision. Donned new brief/short with max A to thread BLE and pull over hips in standing. Donned shirt with overall max A with detailed instruction for hemi-technique + VCs . Req max A to thread LUE through sleeve. Will benefit from cont edu on hemi dressing techniques. Shaved seated in w/c at sink. Put on shaving cream with RUE with set-up A. Therapist shaved face with total A 2/2 pt not being safe to use a regular razor. Pt req mod A to maintain LUE in WB on sink throughout shaving with max instructional cueing to maintain upright posture and anterior pelvic tilt. Pt c/o of skin irritation on R wrist 2/2 bracelets. Removed  bracelets and RN applied barrier cream during session. Pt left in w/c with safety belt alarm engaged, call bell in reach, and mother present.   Therapy Documentation Precautions:  Precautions Precautions: Fall Precaution Comments: [name], fluctuating tone in the LUE Restrictions Weight Bearing Restrictions: No Pain: Pain Assessment Pain Scale: 0-10 Pain Score: 0-No pain ADL: See Care Tool for more details.  Therapy/Group: Individual Therapy  Volanda Napoleon MS, OTR/L Clyda Greener OTR/L   10/22/2020, 12:08 PM

## 2020-10-22 NOTE — Progress Notes (Signed)
[place] PHYSICAL MEDICINE & REHABILITATION PROGRESS NOTE   Subjective/Complaints: No issues overnight , concerned about shaving.  Discussed that OT can work on shaving with pt  ROS: Patient deniesCP, SOB, N/V/D   Objective:   No results found. No results for input(s): WBC, HGB, HCT, PLT in the last 72 hours. No results for input(s): NA, K, CL, CO2, GLUCOSE, BUN, CREATININE, CALCIUM in the last 72 hours.  Intake/Output Summary (Last 24 hours) at 10/22/2020 0932 Last data filed at 10/22/2020 0854 Gross per 24 hour  Intake 1195 ml  Output 1830 ml  Net -635 ml        Physical Exam: Vital Signs Blood pressure 110/71, pulse (!) 51, temperature (!) 97.4 F (36.3 C), temperature source Oral, resp. rate 18, height _0  (1.778 m), weight 84.4 kg, SpO2 96 %.    General: No acute distress Mood and affect are appropriate Heart: Regular rate and rhythm no rubs murmurs or extra sounds Lungs: Clear to auscultation, breathing unlabored, no rales or wheezes Abdomen: Positive bowel sounds, soft nontender to palpation, nondistended Extremities: No clubbing, cyanosis, or edema Skin: No evidence of breakdown, no evidence of rash Musc: No edema in extremities.  No tenderness in extremities. Neuro: Alert Motor: RUE/RLE: 5/5 proximal distal LUE: Shoulder abduction, elbow flexion/extension 4-/5, handgrip 4-/5 with flexor syndergy Brunnstrom 4 Wrist ext 2-, finger ext 3/5 Left lower extremity: 4 -/5 proximal distal  Assessment/Plan: 1. Functional deficits which require 3+ hours per day of interdisciplinary therapy in a comprehensive inpatient rehab setting.  Physiatrist is providing close team supervision and 24 hour management of active medical problems listed below.  Physiatrist and rehab team continue to assess barriers to discharge/monitor patient progress toward functional and medical goals  Care Tool:  Bathing    Body parts bathed by patient: Right arm, Face, Left arm, Chest,  Abdomen, Front perineal area, Buttocks, Right upper leg, Left upper leg, Right lower leg, Left lower leg   Body parts bathed by helper: Buttocks     Bathing assist Assist Level: Minimal Assistance - Patient > 75%     Upper Body Dressing/Undressing Upper body dressing   What is the patient wearing?: Pull over shirt    Upper body assist Assist Level: Maximal Assistance - Patient 25 - 49%    Lower Body Dressing/Undressing Lower body dressing      What is the patient wearing?: Incontinence brief, Pants     Lower body assist Assist for lower body dressing: Maximal Assistance - Patient 25 - 49%     Toileting Toileting    Toileting assist Assist for toileting: Minimal Assistance - Patient > 75%     Transfers Chair/bed transfer  Transfers assist     Chair/bed transfer assist level: Minimal Assistance - Patient > 75%     Locomotion Ambulation   Ambulation assist      Assist level: Moderate Assistance - Patient 50 - 74% Assistive device: No Device Max distance: 135f   Walk 10 feet activity   Assist     Assist level: Minimal Assistance - Patient > 75% Assistive device: No Device   Walk 50 feet activity   Assist Walk 50 feet with 2 turns activity did not occur: Safety/medical concerns  Assist level: Moderate Assistance - Patient - 50 - 74% Assistive device: No Device    Walk 150 feet activity   Assist Walk 150 feet activity did not occur: Safety/medical concerns  Assist level: Moderate Assistance - Patient - 50 - 74% Assistive  device: No Device    Walk 10 feet on uneven surface  activity   Assist Walk 10 feet on uneven surfaces activity did not occur: Safety/medical concerns         Wheelchair     Assist Will patient use wheelchair at discharge?: Yes Type of Wheelchair: Manual    Wheelchair assist level: Minimal Assistance - Patient > 75% Max wheelchair distance: 100    Wheelchair 50 feet with 2 turns activity    Assist         Assist Level: Minimal Assistance - Patient > 75%   Wheelchair 150 feet activity     Assist      Assist Level: Moderate Assistance - Patient 50 - 74%   Blood pressure 110/71, pulse (!) 51, temperature (!) 97.4 F (36.3 C), temperature source Oral, resp. rate 18, height _0  (1.778 m), weight 84.4 kg, SpO2 96 %.    Medical Problem List and Plan: 1.  Left-sided hemiparesis, now with spasticity and facial droop secondary to acute infarct right basal ganglia/corona radiata on 09/26/2020 as well as history of astrocytoma with left frontal craniotomy resection 20 years ago  Continue CIR- Team conference today please see physician documentation under team conference tab, met with team  to discuss problems,progress, and goals. Formulized individual treatment plan based on medical history, underlying problem and comorbidities.    WHO nightly   Baclofen 5m TID. 2.  Antithrombotics: -DVT/anticoagulation: SCDs             -antiplatelet therapy: Aspirin 81 mg daily and Plavix 75 mg daily x3 weeks then aspirin alone-Off  Plavix  3. Pain Management: Tylenol as needed.  4. Mood: Lexapro 10 mg daily.  Provide emotional support             -antipsychotic agents: N/A 5. Neuropsych: This patient is capable of making decisions on his own behalf. 6. Skin/Wound Care: Routine skin checks  Santyl to wound 7. Fluids/Electrolytes/Nutrition:  8.  Seizure disorder.    Continue Lamictal 225 mg daily and 300 mg nightly  No seizure since rehab admission  9.  Hyperlipidemia.  LDL 131 on 11/13. Continue Lipitor 10. Hypothyroidism: Synthroid 11.  Leukocytosis: resolved last WBC 11/29 6.5K    Afebrile 12.  Bladder incontinence improved, still with occ urge incont, trial low dose ditropan 13.  Bowel incont improved   14.  Sleep is poor cont melatonin increase to 674m Improved LOS: 22 days A FACE TO FACE EVALUATION WAS PERFORMED  AnCharlett Blake2/06/2020, 9:32 AM

## 2020-10-22 NOTE — Progress Notes (Signed)
Speech Language Pathology Daily Session Note  Patient Details  Name: Guido Comp MRN: [id number] Date of Birth: [birth year]  Today's Date: 10/22/2020 SLP Individual Time: 9102-8902 SLP Individual Time Calculation (min): 28 min  Short Term Goals: Week 4: SLP Short Term Goal 1 (Week 4): STG=LTG due to remaining length of stay  Skilled Therapeutic Interventions: Pt was seen for skilled ST targeting cognitive goals. SLP facilitated session with a Min A verbal and visual cues for problem solving, planning, and recall, and Supervision A verbal cues for error awareness during a novel semi-complex card task (blink). Memory notebook updated prior to leaving. Pt left laying in bed with alarm set and needs within reach. Continue per current plan of care.      Pain Pain Assessment Pain Scale: 0-10 Pain Score: 0-No pain  Therapy/Group: Individual Therapy  Arbutus Leas 10/22/2020, 7:26 AM

## 2020-10-22 NOTE — Progress Notes (Signed)
Physical Therapy Session Note  Patient Details  Name: Howard White MRN: [id number] Date of Birth: 1961-11-12  Today's Date: 10/22/2020 PT Individual Time: 1300-1400 PT Individual Time Calculation (min): 60 min   Short Term Goals: Week 4:  PT Short Term Goal 1 (Week 4): STGs = to LTGs based on ELOS  Skilled Therapeutic Interventions/Progress Updates:   Patient received sitting up in wc, agreeable to PT. He denies pain. Rec therapy present for co-treat. PT propelling patient to therapy apartment for time management and energy conservation. Patient participating in fruit scavenger hunt in therapy kitchen with the following instructions: find 3 pieces of fruit in order of apple, orange, banana and place them on the counter. The fruit was placed in various cabinets. Patient ambulating down kitchen counter, initially side stepping with ModA. Patient demonstrating unsafe behaviors, reaching for cabinets that were far outside his reach. Patient requiring consistent and Max verbal cuing to walk facing forward, not side stepping and to stop in front of cabinet before opening it to ensure adequate balance and prevent LOB. Patient often attempting to open cabinet/resume walking before achieving adequate balance resulting in multiple LOB's requiring up to MaxA from PT to assist in recovering. Patient able to remember order of fruit, but had difficulty remembering which cabinets he had already opened and where he saw each fruit. Patient completing same task with 4 fruits and required ModA to remember order to fruit, where fruit of was placed in which cabinet and safety techniques discussed from previous trial. Patient remains very impulsive with erratic movements. PT discussing with patient moving slower than what he thinks is "normal" with hopes that the speed he's moving at becomes a safe one. Patient verbalizing understanding.  Patient returning to room in wc, ambulating into bathroom with [place], HHA to toilet.  Patient found to have had large episode of bowel incontinence requiring extensive clean up seated and standing. Despite verbal instructions to just stand in front of toilet while 2nd person assisted with peri-hygiene, patient making multiple attempts to walk forward or turn to face wall. TotalA for peri-hygiene in standing and sitting as well as clothing management. Patient transferring back to wc via stand pivot with ModA. He remained up in wc, seatbelt alarm on, call light within reach.     Therapy Documentation Precautions:  Precautions Precautions: Fall Precaution Comments: [name], fluctuating tone in the LUE Restrictions Weight Bearing Restrictions: No    Therapy/Group: Individual Therapy  Karoline Caldwell, PT, DPT, CBIS  10/22/2020, 7:45 AM

## 2020-10-22 NOTE — Progress Notes (Signed)
Physical Therapy Session Note  Patient Details  Name: Howard White MRN: [id number] Date of Birth: 04/10/1961  Today's Date: 10/22/2020 PT Individual Time: 1505-1535 PT Individual Time Calculation (min): 30 min   Short Term Goals: Week 4:  PT Short Term Goal 1 (Week 4): STGs = to LTGs based on ELOS  Skilled Therapeutic Interventions/Progress Updates: Pt presented in w/c agreeable to therapy. Pt requesting additional coverage on top due to being cold. Obtained long sleeved shirt and donned with maxA. Pt then transported to dayroom and remaining session focused on block practice of stand pivot transfers. Pt was overall CGA for transfer however required max verbal cues to move with purpose and to use R hand. Pt was most responsive to PTA stating "stand/find you balance/turn slowly/find your balance/reach back and sit slowly". Pt noted initially to move impulsively and quickly however with PTA providing tactile cues to stop before next movement pt was able to improve and last 2 transfers pt performed with safe sequencing and minimal cues. Pt transported back to room at end of session and remained in w/c. Pt left in w/c with belt alarm on, call bell within reach and needs met.      Therapy Documentation Precautions:  Precautions Precautions: Fall Precaution Comments: [name], fluctuating tone in the LUE Restrictions Weight Bearing Restrictions: No General:   Vital Signs:  Pain: Pain Assessment Pain Scale: 0-10 Pain Score: 0-No pain    Therapy/Group: Individual Therapy      , PTA  10/22/2020, 3:51 PM  

## 2020-10-22 NOTE — Progress Notes (Signed)
Recreational Therapy Session Note  Patient Details  Name: Dvante Hands MRN: [id number] Date of Birth: 1961/04/05 Today's Date: 10/22/2020  Pain: no c/o Skilled Therapeutic Interventions/Progress Updates: Session focused on kitchen scavenger hunt locating 3-4 items in the correct order given verbal instructions from the kitchen cabinets.  Pt ambulated using counter top for UE support including side stepping and forwards walking.  Pt required mod- max assist for balance/ambulation and max instructional cues for safety throughout session.  Pt demonstrated decreased safety awareness reaching outside BOS to open cabinets, impulsivity with movement creating multiple LOBs.  Pt returned to the room due suspected bowel incontinence.  Pt ambulated to toilet with hand held assist/mod assist.  Pt with large bowel incontinence needing total assist for hygiene and clean up and mod assist/max instructional cues for safety.    Therapy/Group: Co-Treatment Kehaulani Fruin 10/22/2020, 4:04 PM

## 2020-10-22 NOTE — Patient Care Conference (Signed)
Inpatient RehabilitationTeam Conference and Plan of Care Update Date: 10/22/2020   Time: 10:47 AM    Patient Name: Howard White      Medical Record Number: [id number]  Date of Birth: Mar 29, 1961 Sex: Male         Room/Bed: 4W13C/4W13C-01 Payor Info: Payor: [employer] / Plan: [employer] PPO / Product Type: *No Product type* /    Admit Date/Time:  09/30/2020  6:21 PM  Primary Diagnosis:  Cerebrovascular accident (CVA) of right basal ganglia [facility])  Hospital Problems: Principal Problem:   Cerebrovascular accident (CVA) of right basal ganglia (HCC) Active Problems:   Basal ganglia stroke (HCC)   Incontinence of feces   Leukocytosis   Seizures (HCC)   Spastic hemiparesis (HCC)   Slow transit constipation   Sleep disturbance    Expected Discharge Date: Expected Discharge Date: 10/29/20  Team Members Present: Physician leading conference: Dr. Alysia Penna Care Coodinator Present: Erlene Quan, BSW;Leiani Enright Hervey Ard, RN, BSN, [place] Nurse Present: Other (comment) Loraine Leriche, RN) PT Present: Estevan Ryder, PT OT Present: Clyda Greener, OT SLP Present: Jettie Booze, CF-SLP PPS Coordinator present : Gunnar Fusi, Novella Olive, PT     Current Status/Progress Goal Weekly Team Focus  Bowel/Bladder   continent B/B. LBM 12/7  maintain continence  toilet q2h   Swallow/Nutrition/ Hydration             ADL's   Min assist for UB batrhing with mod assist for UB dressing.  LB bathing min assist with LB dressing at mod assist level.  Min to mod for stand pivot transfers to the 3:1 and shower seat.  Mod assist for toilet hygiene sit to stand.  He is able to use the LUE at a gross assist level overall with mod instructional cueing and min assist.  Still needs max hand over hand to use with bathing tasks. Decreased memory and safety awareness as well as increased impulsivity.  min assist overall  selfcare retraining, transfer retraining, balance retraining, NMR,  cognitive retraining, DME edu, pt/family edu   Mobility   supervision/CGA bed mobility, min/mod assist sit<>stand and stand pivot transfers, gait training up to 163ft with min/mod assist without AD - impaired L LE foot clearance during swing  min assist overall at ambulatory level  L LE neuro re-ed, dynamic standing balance, gait training, trunk strengthening with midline orientation, dual-task challenges, pt/family education, discharge planning, activity tolerance   Communication   90-95% intelligible, supervision A  Supervision A  carryover speech strategies during functional and more cognitively demanding tasks   Safety/Cognition/ Behavioral Observations  Min A  Supervision-Min  emergent awareness, selecive attention, safety awareness, recall with strategies, complex problem sovling and organization   Pain   c/o occasional lower back pain. Resolves with Tylenol  Maintain patient's pain goal of 2/10  Monitor, anticipate and treat pain as needed   Skin   skin intact           Discharge Planning:  D/C home with parents (mother-primary caregiver) 24/7. 2 level home (able to stay on 1st floor)   Team Discussion: Patient is continent of bowel and bladder. Some word finding issues at times and confusion. Progress limited by inconsistent functional levels and impulsivity. Anticipate will be able to use left upper extremity for gross assistance however not functional use due to tone noted.  Currently min - mod assist with a rolling walker for ambulation due to left foot clearance difficulties during ambulation. Family is anxious about discharge. Patient on target to meet  rehab goals: yes, min assist goals set and currently min-mod assist overall; mod assist for clothes management. Min assist for cognition  *See Care Plan and progress notes for long and short-term goals.   Revisions to Treatment Plan:  Memory strategies and speech intelligibility strategies Teaching Needs: Transfers, toileting,  medication management, etc.  Current Barriers to Discharge: Decreased caregiver support and Home enviroment access/layout  Possible Resolutions to Barriers: Hands on training with family DME ordered; may need hospital bed at home     Medical Summary Current Status: increasing tone LUE, occ incont of bladder , difficulty with managing urinal, min A cognitive tasks  Barriers to Discharge: Neurogenic Bowel & Bladder   Possible Resolutions to Barriers/Weekly Focus: needs more family ed, discuss complex ADL   Continued Need for Acute Rehabilitation Level of Care: The patient requires daily medical management by a physician with specialized training in physical medicine and rehabilitation for the following reasons: Direction of a multidisciplinary physical rehabilitation program to maximize functional independence : Yes Medical management of patient stability for increased activity during participation in an intensive rehabilitation regime.: Yes Analysis of laboratory values and/or radiology reports with any subsequent need for medication adjustment and/or medical intervention. : Yes   I attest that I was present, lead the team conference, and concur with the assessment and plan of the team.   Dorien Chihuahua B 10/22/2020, 1:38 PM

## 2020-10-23 ENCOUNTER — Inpatient Hospital Stay (HOSPITAL_COMMUNITY): Payer: PPO | Admitting: Occupational Therapy

## 2020-10-23 ENCOUNTER — Inpatient Hospital Stay (HOSPITAL_COMMUNITY): Payer: PPO | Admitting: Physical Therapy

## 2020-10-23 ENCOUNTER — Inpatient Hospital Stay (HOSPITAL_COMMUNITY): Payer: PPO | Admitting: Speech Pathology

## 2020-10-23 NOTE — Progress Notes (Signed)
[place] PHYSICAL MEDICINE & REHABILITATION PROGRESS NOTE   Subjective/Complaints: Discussed care team, discussed cognitive issues Pt concentrating on using urinal indep  ROS: Patient deniesCP, SOB, N/V/D   Objective:   No results found. No results for input(s): WBC, HGB, HCT, PLT in the last 72 hours. No results for input(s): NA, K, CL, CO2, GLUCOSE, BUN, CREATININE, CALCIUM in the last 72 hours.  Intake/Output Summary (Last 24 hours) at 10/23/2020 0852 Last data filed at 10/23/2020 0849 Gross per 24 hour  Intake 531 ml  Output 730 ml  Net -199 ml        Physical Exam: Vital Signs Blood pressure 107/64, pulse (!) 52, temperature (!) 97.5 F (36.4 C), temperature source Oral, resp. rate 18, height 5\' 10"  (1.778 m), weight 84.4 kg, SpO2 94 %.     General: No acute distress Mood and affect are appropriate Heart: Regular rate and rhythm no rubs murmurs or extra sounds Lungs: Clear to auscultation, breathing unlabored, no rales or wheezes Abdomen: Positive bowel sounds, soft nontender to palpation, nondistended Extremities: No clubbing, cyanosis, or edema Skin: No evidence of breakdown, no evidence of rash   Motor: RUE/RLE: 5/5 proximal distal LUE: Shoulder abduction, elbow flexion/extension 4-/5, handgrip 4-/5 with flexor synergy Brunnstrom 4 MAS 2/3  In L finger and wrist flexors , MAS 1 elbow flexors Wrist ext 2-, finger ext 3/5 Left lower extremity: 4 -/5 proximal distal  Assessment/Plan: 1. Functional deficits which require 3+ hours per day of interdisciplinary therapy in a comprehensive inpatient rehab setting.  Physiatrist is providing close team supervision and 24 hour management of active medical problems listed below.  Physiatrist and rehab team continue to assess barriers to discharge/monitor patient progress toward functional and medical goals  Care Tool:  Bathing    Body parts bathed by patient: Right arm,Face,Left arm,Chest,Abdomen,Front perineal  area,Buttocks,Right upper leg,Left upper leg,Right lower leg,Left lower leg   Body parts bathed by helper: Buttocks     Bathing assist Assist Level: Minimal Assistance - Patient > 75%     Upper Body Dressing/Undressing Upper body dressing   What is the patient wearing?: Pull over shirt    Upper body assist Assist Level: Maximal Assistance - Patient 25 - 49%    Lower Body Dressing/Undressing Lower body dressing      What is the patient wearing?: Incontinence brief,Pants     Lower body assist Assist for lower body dressing: Maximal Assistance - Patient 25 - 49%     Toileting Toileting    Toileting assist Assist for toileting: Minimal Assistance - Patient > 75%     Transfers Chair/bed transfer  Transfers assist     Chair/bed transfer assist level: Minimal Assistance - Patient > 75%     Locomotion Ambulation   Ambulation assist      Assist level: Moderate Assistance - Patient 50 - 74% Assistive device: No Device Max distance: 30   Walk 10 feet activity   Assist     Assist level: Moderate Assistance - Patient - 50 - 74% Assistive device: No Device   Walk 50 feet activity   Assist Walk 50 feet with 2 turns activity did not occur: Safety/medical concerns  Assist level: Moderate Assistance - Patient - 50 - 74% Assistive device: No Device    Walk 150 feet activity   Assist Walk 150 feet activity did not occur: Safety/medical concerns  Assist level: Moderate Assistance - Patient - 50 - 74% Assistive device: No Device    Walk 10 feet  on uneven surface  activity   Assist Walk 10 feet on uneven surfaces activity did not occur: Safety/medical concerns         Wheelchair     Assist Will patient use wheelchair at discharge?: Yes Type of Wheelchair: Manual    Wheelchair assist level: Minimal Assistance - Patient > 75% Max wheelchair distance: 100    Wheelchair 50 feet with 2 turns activity    Assist        Assist Level:  Minimal Assistance - Patient > 75%   Wheelchair 150 feet activity     Assist      Assist Level: Moderate Assistance - Patient 50 - 74%   Blood pressure 107/64, pulse (!) 52, temperature (!) 97.5 F (36.4 C), temperature source Oral, resp. rate 18, height 5\' 10"  (1.778 m), weight 84.4 kg, SpO2 94 %.    Medical Problem List and Plan: 1.  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 2.  Antithrombotics: -DVT/anticoagulation: SCDs             -antiplatelet therapy: Aspirin 81 mg daily and Plavix 75 mg daily x3 weeks then aspirin alone-Off  Plavix  3. Pain Management: Tylenol as needed.  4. Mood: Lexapro 10 mg daily.  Provide emotional support             -antipsychotic agents: N/A 5. Neuropsych: This patient is capable of making decisions on his own behalf. 6. Skin/Wound Care: Routine skin checks  Santyl to wound 7. Fluids/Electrolytes/Nutrition:  8.  Seizure disorder.    Continue Lamictal 225 mg daily and 300 mg nightly  No seizure since rehab admission  9.  Hyperlipidemia.  LDL 131 on 11/13. Continue Lipitor 10. Hypothyroidism: Synthroid 11.  Leukocytosis: resolved last WBC 11/29 6.5K    Afebrile 12.  Bladder incontinence improved, still with occ urge incont, trial low dose ditropan 13.  Bowel incont improved   14.  Sleep is poor cont melatonin increase to 6mg   Improved LOS: 23 days A FACE TO FACE EVALUATION WAS PERFORMED  Charlett Blake 10/23/2020, 8:52 AM

## 2020-10-23 NOTE — Progress Notes (Signed)
Occupational Therapy Session Note  Patient Details  Name: Howard White MRN: [id number] Date of Birth: Oct 25, 1961  Today's Date: 10/23/2020 OT Individual Time: 1000-1102 OT Individual Time Calculation (min): 62 min    Short Term Goals: Week 3:  OT Short Term Goal 1 (Week 3): Pt will complete UB dressing with min assist for two consecutive sessions. OT Short Term Goal 2 (Week 3): Pt will complete LB bathing with min assist sit to stand for two consecutive sessions. OT Short Term Goal 3 (Week 3): Pt will use the LUE for washing the RUE with mod facilitation for two consecutive sessions. OT Short Term Goal 4 (Week 3): Pt will complete walk-in shower transfers with min assist stand pivot for 2 consecutive sessions.  Skilled Therapeutic Interventions/Progress Updates:    Pt sitting in bed to start session reporting that he needed to go to the bathroom.  He exhibited slight bladder and bowel incontinence in his brief.  He was able to transfer to the EOB with supervision and then ambulated to the bathroom with mod assist and use of the RW for support.  Hand splint on the left side to help maintain grasp on the RW.  Pt demonstrated increased tone in the LUE during mobility, resulting in the RW at times coming off of the ground when advancing it.  He needed min assist for clothing management prior to sitting down.  Mod assist was needed to complete toilet hygiene in standing before ambulating over to the shower for bathing.  [name] completed bathing sit to stand with mod assist overall and mod demonstrational cueing for organization and sequencing as he tends to wash varying body parts randomly.  He needed mod assist for washing buttocks in standing as well as rinsing off.  Stand pivot transfer was completed to the wheelchair with mod assist using the RW in order to work on dressing tasks at the sink.  He needs max assist for UB dressing secondary to dressing apraxia and not being able to orient and  sequence task.  Donning brief sit to stand was at mod assist level with max assist for pants as well.  Therapist assisted with donning TEDs and then he was able to donn his shoes with setup.  He continues to demonstrate increased impulsivity with sit to stand and all tasks, requiring mod to max instructional cueing to slow down and wait for therapist to tell him what to do.  He transferred back to the bed per his request with mod assist stand pivot, with transition to supine at supervision level.  Call button and phone in reach with safety alarm in place.    Therapy Documentation Precautions:  Precautions Precautions: Fall Precaution Comments: [name], fluctuating tone in the LUE Restrictions Weight Bearing Restrictions: No  Pain: Pain Assessment Pain Scale: 0-10 Pain Score: 0-No pain ADL: See Care Tool Section for some details of mobility and selfcare  Therapy/Group: Individual Therapy  Shakeyla Giebler OTR/L 10/23/2020, 12:39 PM

## 2020-10-23 NOTE — Progress Notes (Signed)
Speech Language Pathology Daily Session Note  Patient Details  Name: Howard White MRN: [id number] Date of Birth: 1961-08-28  Today's Date: 10/23/2020 SLP Individual Time: 8257-4935 SLP Individual Time Calculation (min): 57 min  Short Term Goals: Week 4: SLP Short Term Goal 1 (Week 4): STG=LTG due to remaining length of stay  Skilled Therapeutic Interventions: Pt was seen for skilled ST targeting cognitive skills. SLP facilitated session with an alternating attention task (between familiar semi-complex card task and trail making worksheet). Pt required overall Min A verbal cues to alternate attention, however the alternations in attention required increased cueing for problem solving and error awareness during tasks, and he was unable to truly utilize strategies for planning/strategy. Extra time was also required, as it took the entire hour and tasks only 85% completed. Education provided regarding increased cognitive demand when presented with challenge of alternating between 2 tasks and the importance of slowing down and planning for greater accuracy. Pt left sitting in bed with alarm set and needs within reach, MD present. Continue per current plan of care.          Pain Pain Assessment Pain Scale: 0-10 Pain Score: 0-No pain  Therapy/Group: Individual Therapy  Arbutus Leas 10/23/2020, 7:17 AM

## 2020-10-23 NOTE — Progress Notes (Signed)
Patient ID: Howard White, male   DOB: May 22, 1961, 59 y.o.   MRN: [id number]   [facility] agency list provided to patient mother. DME ordered through [employer].  [place], [place]

## 2020-10-23 NOTE — Progress Notes (Signed)
Physical Therapy Session Note  Patient Details  Name: Howard White MRN: [id number] Date of Birth: Mar 23, 1961  Today's Date: 10/23/2020 PT Individual Time: 1420-1533 PT Individual Time Calculation (min): 73 min   Short Term Goals: Week 4:  PT Short Term Goal 1 (Week 4): STGs = to LTGs based on ELOS  Skilled Therapeutic Interventions/Progress Updates:    Pt received supine in bed and agreeable to therapy session. Supine>sitting R EOB, HOB slightly elevated and using R UE support on bedrail for trunk upright, with supervision for safety. L stand pivot EOB>w/c with R UE support on therapist and min assist for balance.  Transported to/from gym in w/c for time management and energy conservation. Gait training ~162ft, no AD, with min assist for balance primarily and intermittent mod assist when in distracting environment highlighting impaired dual-task resulting in lack of L LE foot clearance during swing and anterior LOB - continues to demonstrate minor R lateral trunk flexion compensation during L LE swing phase due to lack of hip/knee flexion to clear foot with pt continuing to scuff foot on ground - cuing for improvement. Trialed gait ~152ft using RW to determine pt safety with AD vs no AD - continues to require min/mod assist for balance as above but frequent assist for AD management - requires assist for L hand placement on RW orthotic and throughout gait L UE flexor tone would cause pt's fingers to curl up pushing into the orthotic causing discomfort/pain, therapist cuing for finger extension and facilitating repositioning 3x during gait - demonstrates unsafe management of AD when turning and otherwise RW doesn't appear to increase pt's stability or balance compared to without AD - will continue to assess LRAD but at this time feel no AD is more safe than RW. Lattie Haw, Rec Therapist, present for remainder of session. With simple question cuing, pt able to recall education provided during PT session  yesterday regarding safety in the kitchen with importance of stepping over to a cabinet and repositioning to face it as opposed to reaching outside BOS. Discussed pt's need for mod assist to complete those tasks safely in the kitchen yesterday and discussed discharge plan with pt's mother providing support to her husband in addition to becoming the caregiver for pt upon his discharge. With this discussion, pt agreeable to utilizing the w/c more in the home to increase his independence with functional mobility as feel pt likely will not be safe to ambulate in the home with family by discharge. Arranged custom wheelchair consultation for Monday 10/27/20. Transported to ADL apartment in w/c. Discussed layout of environment and techniques for pt to be able to perform necessary tasks from wheelchair level for increased independence - performed w/c mobility in ADL apartment with mod assist initially and pt demonstrating significant motor planning impairments using R LE technique and even more difficulty with dual-task of R UE and R LE. Trialed gait training in ADL apartment to determine pt's LOF now that he is more familiar with the task and he continues to require heavy min assist and intermittent mod assist for balance - demos worsening gait impairments with decreased B LE step lengths and even worse L foot clearance in this smaller environment compared to in hallways when walking longer, open distances. Performed additional ~57ft wheelchair mobility through ADL apartment with min progressing to supervision level assist with pt continuing to require cuing/facilitation for motor planning - demoing some improvement with just R LE propulsion technique. Transported back to room and pt agreeable to remain sitting in  w/c. Discussed having pt's mother attend therapy sessions tomorrow due to concerns of only having 2 days of training next week - pt called his mother to arrange. Pt left sitting in w/c with needs in reach and  seat belt alarm on.   Therapy Documentation Precautions:  Precautions Precautions: Fall Precaution Comments: [name], fluctuating tone in the LUE Restrictions Weight Bearing Restrictions: No  Pain: No reports of pain throughout session.   Therapy/Group: Individual Therapy  Tawana Scale , PT, DPT, CSRS  10/23/2020, 12:39 PM

## 2020-10-24 ENCOUNTER — Inpatient Hospital Stay (HOSPITAL_COMMUNITY): Payer: PPO | Admitting: Physical Therapy

## 2020-10-24 ENCOUNTER — Inpatient Hospital Stay (HOSPITAL_COMMUNITY): Payer: PPO | Admitting: Speech Pathology

## 2020-10-24 ENCOUNTER — Inpatient Hospital Stay (HOSPITAL_COMMUNITY): Payer: PPO | Admitting: Occupational Therapy

## 2020-10-24 MED ORDER — OXYBUTYNIN CHLORIDE 5 MG PO TABS
5.0000 mg | ORAL_TABLET | Freq: Two times a day (BID) | ORAL | Status: DC
  Administered 2020-10-24 – 2020-10-27 (×6): 5 mg via ORAL
  Filled 2020-10-24 (×6): qty 1

## 2020-10-24 NOTE — Progress Notes (Signed)
Patient ID: Howard White, male   DOB: 1961-03-24, 59 y.o.   MRN: [id number]   Patient declined by [facility] due to insurance  [name], [name]

## 2020-10-24 NOTE — Progress Notes (Signed)
Patient ID: Howard White, male   DOB: 06-04-1961, 58 y.o.   MRN: [id number]   Patient referral sent to [facility] for review.  [place], [place]

## 2020-10-24 NOTE — Progress Notes (Signed)
Patient ID: Howard White, male   DOB: 07/20/1961, 59 y.o.   MRN: [id number]    Patient declined by [facility] due to insurance  [place], [place]

## 2020-10-24 NOTE — Progress Notes (Signed)
Physical Therapy Session Note  Patient Details  Name: Howard White MRN: [id number] Date of Birth: July 04, 1961  Today's Date: 10/24/2020 PT Individual Time: [id number] and 1517-6160 PT Individual Time Calculation (min): 71 min and 33 min  Short Term Goals: Week 4:  PT Short Term Goal 1 (Week 4): STGs = to LTGs based on ELOS  Skilled Therapeutic Interventions/Progress Updates:    Session 1: Pt received supine in bed with his mother, Howard White, and the RN present for morning medications. Pt agreeable to therapy session and pt's mother agreeable to participate in education and hands-on training in preparation for upcoming discharge. Therapist educated pt's mother on pt's CLOF, pt's need to use wheelchair for functional household mobility as he is unable to safely ambulate with family at this time, and plan to further discuss appropriate wheelchair needs during this session. Also, discussed therapist's concerns regarding pt's safety due to impaired dual-task abilities and impulsivity requiring family to provide additional cuing for pt safety in the home. Supine>sitting R EOB, HOB partially elevated and using bedrail, with supervision for safety. Sitting EOB, doffed dirty shirt min assist and donned clean shirt mod assist - threaded LEs into pants and donned shoes max assist. Therapist educated pt's mother on recommendation that pt stand statically using UE support on RW while family performs total assist LB clothing management due to impaired dual-task abilities and poor awareness when LOB is occurring - demonstrated this to pull up pants over hips. For demonstration of why pt is unsafe to ambulate with family at this time, had pt ambulate in/out of bathroom ~69ft, no AD, with min assist from therapist and pt demoing worsening R lateral trunk lean and L LE foot clearance while navigating around obstacles and turning in the room - pt's mother agrees with recommendation and reports greater appreciation of the above  concerns now that she has witnessed this task. Educated on recommendation to perform stand pivot transfers EOB<>w/c without AD to decrease dual-task challenges required with AD management - and to use gait belt. Performed block practice stand pivot transfers EOB<>w/c with CGA/light min assist x2 for therapist to demonstrate technique - had pt repeat task 2x with pt's mother providing assistance and noticed she would stand too close to pt not allowing enough room for trunk flexion to weight shift forward and come to stand causing pt to lean backwards - provided feedback and education to pt's mother on repositioning but unable to attempt further due to pt reporting urgent need to have BM. Transported in/out bathroom using w/c. MD present for morning assessment and pt's mother speaking to MD while therapist assisted pt with toileting. Stand pivot w/c<>BSC over toilet with min assist for balance - mod assist LB clothing management - continent of bowels and stood with R UE support on grab bar during total assist peri-care. Therapist readjusted w/c to hemi-height for decreased floor-to-seat height to improve propulsion technique using LE while also exchanging seat cushion to basic contoured foam cushion for lower profile. Performed R hemi-technique w/c propulsion ~66ft with supervision and mod cuing for technique and motor planning of UE and LE - noticed pt bumping into items on L side with education to his mother regarding L inattention. Thoroughly discussed wheelchair needs with pt/family and determined recommendation for pt to rent a hemi-height 16x18 wheelchair with a low profile roho-hybrid or gel-hybrid cushion initially until he has reached his highest functional mobility level, and then at that point determine if a custom wheelchair is needed - family in agreement with  this plan. [name] planning to attend additional training prior to discharge. [name] also confirmed that pt will only have a doorway  threshold to enter house, no step-up, and that pt's mother is setting up a room for pt on the 1st floor. At end of session pt left seated in w/c with needs in reach, seat belt alarm on, and pt's mother present.  Session 2: Pt received supine in bed and agreeable to therapy session. Supine>sitting R EOB with close supervision for safety - pt demos decreased safety awareness by not moving sheets off his LEs prior to initiating movement to EOB requiring cuing to correct and also demonstrates increased speed of movement at this time - cuing for slow, controlled movement to increase safety. Performed block practice stand pivot transfers EOB<>w/c 4x with R UE support on therapist, no AD, and pt requiring min assist at this time due to increased instability in standing - continued cuing to come to stand 1st and gain balance prior to initiating stepping to complete transfer - if pt feeling unstable upon initiating coming to stand he would automatically return to sitting and say "hold on, lets try that again" demonstrating some safety awareness but then would quickly, impulsively initiate coming back to stand prior to ensuring his feet are positioned under him safely and prior to confirming that the therapist is ready for him to stand back up. Provided continued education and cuing on slowing speed of movement to ensure safety. Pt required cuing for w/c brake management during session to ensure attention to L side, though improved compared to this AM. R hemi-technique (primarily only using R LE as pt with difficulty dual-task motor planning R UE and LE) w/c propulsion ~62ft in room working on household style w/c navigation with close supervision and cuing for safety as pt frequently bumping items in room, most notable when backing up with poor motor planning on how to back up w/c in correct direction to navigate around the obstacle. Reports sudden urgency to urinate. Transported in/out of bathroom in w/c over doorway  threshold. Sit>stand R UE support on grab bar with min assist for balance - standing same assist and UE support during total assist LB clothing management for pt to continently void in urinal. Once assisted pt down bathroom door threshold for safety, had him perform R hemi-technique w/c propulsion to sink to perform hand hygiene - pt requires significant cuing for safety with this and to align w/c facing sink as he attempts to face it at an angle - cuing to lock brakes prior to initiating hand hygiene to prevent w/c from rolling backwards away from the sink. Attempted B LE w/c propulsion ~11ft in/out of room to determine best propulsion technique with pt demoing improved forward movement using B LEs compared to R hemi or only R LE but same level of difficulty when performing tight spaced turns as he forgets to move L LE to assist with turning. At end of session, pt left sitting in w/c with needs in reach and seat belt alarm on.  Therapy Documentation Precautions:  Precautions Precautions: Fall Precaution Comments: [name], fluctuating tone in the LUE Restrictions Weight Bearing Restrictions: No  Pain:   Session 1: No reports of pain throughout session.  Session 2: No reports of pain throughout session.   Therapy/Group: Individual Therapy  Tawana Scale , PT, DPT, CSRS  10/24/2020, 7:53 AM

## 2020-10-24 NOTE — Progress Notes (Signed)
Occupational Therapy Weekly Progress Note  Patient Details  Name: Howard White MRN: [id number] Date of Birth: Apr 11, 1961  Beginning of progress report period: October 18, 2020 End of progress report period: October 24, 2020  Today's Date: 10/24/2020 OT Individual Time: 1941-7408 OT Individual Time Calculation (min): 83 min    Patient has met 2 of 4 short term goals.  [name] continues to demonstrate steady progress with OT at this time.  He currently needs min assist for bathing sit to stand at shower level.  Dressing is currently still max assist secondary to left hemiparesis and decreased ability to orient clothing and complete hemi dressing techniques.  He is able to complete stand pivot transfers to the wheelchair, 3:1, and shower bench with overall min assist. Impulsivity is still present with all transitional movements and he needs overall max instructional cueing to slow down and for sequencing.  LUE functional use is still at a gross assist level with min assist.  He continues to need max hand over hand to use for washing of the RUE.  Increased tone is noted in the pectoral, elbow, and finger flexors with attempted use secondary to his inability to demonstrate isolated movement.  He continues to use his whole body for all attempted movement of the LUE. Continuing to focus in therapy on not overcompensating with his trunk and head with attempted movement.  Decreased carryover is noted from session to session secondary to memory deficits.  Family education has started and will plan to continue with anticipated discharge on 12/15.  Feel he will continue to benefit from CIR level therapy to progress to min to mod assist level goals.  Will downgrade some goals for dressing and sit to stand.   Patient continues to demonstrate the following deficits: muscle weakness and muscle paralysis, impaired timing and sequencing, abnormal tone, unbalanced muscle activation, decreased coordination and  decreased motor planning, decreased awareness, decreased problem solving, decreased safety awareness and decreased memory and decreased sitting balance, decreased standing balance, decreased postural control, hemiplegia and decreased balance strategies and therefore will continue to benefit from skilled OT intervention to enhance overall performance with BADL and Reduce care partner burden.  Patient not progressing toward long term goals.  See goal revision..  Continue plan of care.  OT Short Term Goals Week 4:  OT Short Term Goal 1 (Week 4): Conitinue working on established LTGs at min assist overall  Skilled Therapeutic Interventions/Progress Updates:    Pt's mom in for family education this session.  He was able to transfer from supine to sit EOB with supervision and cueing to slow down.  Educated pt's mom on stand pivot transfers with and without the RW.  She was able to return demonstrate completion of both from bed to wheelchair and back as well as to the 3:1 with overall min assist and mod instructional cueing for technique from therapist.  Pt continues to need max instructional cueing for sequencing sit to stand and transfers secondary to impulsivity.  He completed toileting in standing as well X2 with overall max assist for use of the urinal as well as for attempted BM. His mom was able to assist with toilet hygiene sit to stand and clothing management without the use of the RW for support and mod instructional cueing.  Discussed benefit of using the RW for standing with clothing management and hygiene vs not having it and his mom standing in front of him.  Will continue to practice and provide education on 12/13 as  well as education on B/D tasks.  Pt finished session with transfer stand pivot to the bed from the wheelchair at min assist level with his mom assisting. He transitioned sit to supine with supervision.  Pt left with call button and phone in reach and bed alarm in place.      Therapy  Documentation Precautions:  Precautions Precautions: Fall Precaution Comments: [name], fluctuating tone in the LUE Restrictions Weight Bearing Restrictions: No  Pain: Pain Assessment Pain Scale: Faces Pain Score: 0-No pain Faces Pain Scale: Hurts a little bit Pain Type: Acute pain Pain Location: Hand Pain Orientation: Left Pain Descriptors / Indicators: Discomfort Pain Onset: With Activity Pain Intervention(s): Repositioned Multiple Pain Sites: No ADL: ADL Eating: Set up Where Assessed-Eating: Wheelchair Grooming: Supervision/safety Where Assessed-Grooming: Wheelchair Upper Body Bathing: Minimal assistance Where Assessed-Upper Body Bathing: Edge of bed Lower Body Bathing: Moderate assistance Where Assessed-Lower Body Bathing: Edge of bed Upper Body Dressing: Moderate assistance Where Assessed-Upper Body Dressing: Edge of bed Lower Body Dressing: Maximal assistance Where Assessed-Lower Body Dressing: Edge of bed Toileting: Maximal assistance Where Assessed-Toileting: Bedside Commode Toilet Transfer: Maximal assistance Toilet Transfer Method: Stand pivot Science writer: Radiographer, therapeutic: Not assessed Social research officer, government: Not assessed Vision   Perception    Praxis   Exercises:   Other Treatments:     Therapy/Group: Individual Therapy  Madaleine Simmon OTR/L 10/24/2020, 3:44 PM

## 2020-10-24 NOTE — Progress Notes (Signed)
Recreational Therapy Session Note  Patient Details  Name: Howard White MRN: [id number] Date of Birth: 12/24/1960 Today's Date: 10/24/2020 LATE ENTRY for 10/23/20 Pain: no c/o Skilled Therapeutic Interventions/Progress Updates: Session focused on discharge planning/safety awareness during co-treat with PT.  Reviewed safety concerns from yesterdays sessions, pt able to state concerns and modifications that he needed to make to be more safe standing and obtaining items from cabinets, closets with min questioning cues.  Discussed pts need for Mod assist yesterday and concern about ambulatory/standing goals once home.  Pt shares that his mom will bet there to help him but is also helping father with tasks as needed.  Discussed pt using w/c primarily in the home for his safety as well as for his mothers safety.  Pt stated understanding and in agreement that focus should be w/c level while still working on standing/ambulation in follow up therapies.  Remainder of session focused on w/c level tasks, revisiting the kitchen to practice and problem solve retrieving and transporting items.  Pt initially requiring mod assist for w/c mobility with difficulty coordinating RUE and RLE but later progressed to min assist/close supervision with verbal cues.  Therapy/Group: Co-Treatment Kreston Ahrendt 10/24/2020, 8:56 AM

## 2020-10-24 NOTE — Progress Notes (Signed)
Speech Language Pathology Daily Session Note  Patient Details  Name: Howard White MRN: [id number] Date of Birth: 1961/05/10  Today's Date: 10/24/2020 SLP Individual Time: 1000-1055 SLP Individual Time Calculation (min): 55 min  Short Term Goals: Week 4: SLP Short Term Goal 1 (Week 4): STG=LTG due to remaining length of stay  Skilled Therapeutic Interventions: Pt was seen for skilled ST targeting cognitive-linguistic goals. SLP facilitated session with Mod faded to [place] A verbal and visual cues for organization and problem solving during a complex deductive reasoning puzzle, that was based in the context of a daily schedule. He also required Mod faded to [place] A for verbal problem solving and use of written aids for memory and problem solving when solving semi-complex time based verbal scenarios. Pt initially required Supervision A verbal cueing for slower rate of speech for intelligibility, likely due to increased cognitive demand placed on pt by targeted tasks. Pt required Min A verbal and visual cues for problem solving and sequencing during a transfer from chair to bed at end of session, with assistance of NT for safe transfer. Pt left laying in bed with alarm set and needs within reach. Continue per current plan of care.        Pain Pain Assessment Pain Scale: 0-10 Pain Score: 0-No pain  Therapy/Group: Individual Therapy  Arbutus Leas 10/24/2020, 7:22 AM

## 2020-10-24 NOTE — Plan of Care (Signed)
  Problem: Sit to Stand Goal: LTG:  Patient will perform sit to stand in prep for activites of daily living with assistance level (OT) Description: LTG:  Patient will perform sit to stand in prep for activites of daily living with assistance level (OT) Flowsheets (Taken 10/24/2020 1651) LTG: PT will perform sit to stand in prep for activites of daily living with assistance level: (Goal downgraded based on slower progress than expected) Minimal Assistance - Patient > 75% Note: Goal downgraded based on slower progress than expected   Problem: RH Dressing Goal: LTG Patient will perform upper body dressing (OT) Description: LTG Patient will perform upper body dressing with assist, with/without cues (OT). Flowsheets (Taken 10/24/2020 1651) LTG: Pt will perform upper body dressing with assistance level of: (Goal downgraded based on slower progress than expected) Moderate Assistance - Patient 50 - 74% Note: Goal downgraded based on slower progress than expected Goal: LTG Patient will perform lower body dressing w/assist (OT) Description: LTG: Patient will perform lower body dressing with assist, with/without cues in positioning using equipment (OT) Flowsheets (Taken 10/24/2020 1651) LTG: Pt will perform lower body dressing with assistance level of: (Goal downgraded based on slower progress than expected) Moderate Assistance - Patient 50 - 74% Note: Goal downgraded based on slower progress than expected.   Problem: RH Memory Goal: LTG Patient will demonstrate ability for day to day recall/carry over during activities of daily living with assistance level (OT) Description: LTG:  Patient will demonstrate ability for day to day recall/carry over during activities of daily living with assistance level (OT). Flowsheets (Taken 10/24/2020 1651) LTG:  Patient will demonstrate ability for day to day recall/carry over during activities of daily living with assistance level (OT): (Goal downgraded based on slower  progress than expected) Moderate Assistance - Patient 50 - 74% Note: Goal downgraded based on slower progress than expected   Problem: RH Awareness Goal: LTG: Patient will demonstrate awareness during functional activites type of (OT) Description: LTG: Patient will demonstrate awareness during functional activites type of (OT) Flowsheets (Taken 10/24/2020 1651) Patient will demonstrate awareness during functional activites type of: Anticipatory LTG: Patient will demonstrate awareness during functional activites type of (OT): (Goal downgraded based on slower progress than expected) Moderate Assistance - Patient 50 - 74% Note: Goal downgraded based on slower progress than expected

## 2020-10-24 NOTE — Plan of Care (Signed)
  Problem: Consults Goal: RH STROKE PATIENT EDUCATION Description: See Patient Education module for education specifics  Outcome: Progressing   Problem: RH SAFETY Goal: RH STG ADHERE TO SAFETY PRECAUTIONS W/ASSISTANCE/DEVICE Description: STG Adhere to Safety Precautions With cues and reminders. Outcome: Progressing   Problem: RH PAIN MANAGEMENT Goal: RH STG PAIN MANAGED AT OR BELOW PT'S PAIN GOAL Description: Pain level less than 4 on scale of 0-10 Outcome: Progressing   Problem: RH KNOWLEDGE DEFICIT Goal: RH STG INCREASE KNOWLEDGE OF HYPERTENSION Description: Pt will be able adhere to medication regimen, dietary and lifestyle modification to better control blood pressure and prevent stroke using handouts and booklets provided with mod I assist.  Outcome: Progressing Goal: RH STG INCREASE KNOWLEDGE OF STROKE PROPHYLAXIS Description: Pt will be able adhere to medication regimen, dietary and lifestyle modification to better control blood pressure and prevent stroke using handouts and booklets provided with mod I assist.  Outcome: Progressing

## 2020-10-24 NOTE — Progress Notes (Signed)
[place] PHYSICAL MEDICINE & REHABILITATION PROGRESS NOTE   Subjective/Complaints:  No issues overnight, mom is in for training, she is unfamiliar with WC use, pt did not require WC after Astrocytoma resection  ROS: Patient deniesCP, SOB, N/V/D   Objective:   No results found. No results for input(s): WBC, HGB, HCT, PLT in the last 72 hours. No results for input(s): NA, K, CL, CO2, GLUCOSE, BUN, CREATININE, CALCIUM in the last 72 hours.  Intake/Output Summary (Last 24 hours) at 10/24/2020 0845 Last data filed at 10/24/2020 0600 Gross per 24 hour  Intake 657 ml  Output 925 ml  Net -268 ml        Physical Exam: Vital Signs Blood pressure 118/72, pulse 60, temperature 98.7 F (37.1 C), resp. rate 18, height 5\' 10"  (1.778 m), weight 84.4 kg, SpO2 96 %.     General: No acute distress Mood and affect are appropriate Heart: Regular rate and rhythm no rubs murmurs or extra sounds Lungs: Clear to auscultation, breathing unlabored, no rales or wheezes Abdomen: Positive bowel sounds, soft nontender to palpation, nondistended Extremities: No clubbing, cyanosis, or edema Skin: No evidence of breakdown, no evidence of rash   Motor: RUE/RLE: 5/5 proximal distal LUE: Shoulder abduction, elbow flexion/extension 4-/5, handgrip 4-/5 with flexor synergy [place] 4 MAS 2/3  In L finger and wrist flexors , MAS 1 elbow flexors Wrist ext 2-, finger ext 3/5 Left lower extremity: 4 -/5 proximal distal  Assessment/Plan: 1. Functional deficits which require 3+ hours per day of interdisciplinary therapy in a comprehensive inpatient rehab setting.  Physiatrist is providing close team supervision and 24 hour management of active medical problems listed below.  Physiatrist and rehab team continue to assess barriers to discharge/monitor patient progress toward functional and medical goals  Care Tool:  Bathing    Body parts bathed by patient: Face,Left arm,Chest,Abdomen,Front perineal  area,Buttocks,Right upper leg,Left upper leg,Right lower leg,Left lower leg   Body parts bathed by helper: Right arm     Bathing assist Assist Level: Minimal Assistance - Patient > 75%     Upper Body Dressing/Undressing Upper body dressing   What is the patient wearing?: Pull over shirt    Upper body assist Assist Level: Maximal Assistance - Patient 25 - 49%    Lower Body Dressing/Undressing Lower body dressing      What is the patient wearing?: Incontinence brief,Pants     Lower body assist Assist for lower body dressing: Maximal Assistance - Patient 25 - 49%     Toileting Toileting    Toileting assist Assist for toileting: Moderate Assistance - Patient 50 - 74%     Transfers Chair/bed transfer  Transfers assist     Chair/bed transfer assist level: Minimal Assistance - Patient > 75% (stand pivot)     Locomotion Ambulation   Ambulation assist      Assist level: Moderate Assistance - Patient 50 - 74% Assistive device: No Device Max distance: 167ft   Walk 10 feet activity   Assist     Assist level: Moderate Assistance - Patient - 50 - 74% Assistive device: No Device   Walk 50 feet activity   Assist Walk 50 feet with 2 turns activity did not occur: Safety/medical concerns  Assist level: Moderate Assistance - Patient - 50 - 74% Assistive device: No Device    Walk 150 feet activity   Assist Walk 150 feet activity did not occur: Safety/medical concerns  Assist level: Moderate Assistance - Patient - 50 - 74% Assistive  device: No Device    Walk 10 feet on uneven surface  activity   Assist Walk 10 feet on uneven surfaces activity did not occur: Safety/medical concerns         Wheelchair     Assist Will patient use wheelchair at discharge?: Yes Type of Wheelchair: Manual    Wheelchair assist level: Moderate Assistance - Patient 50 - 74%,Minimal Assistance - Patient > 75% Max wheelchair distance: 28ft    Wheelchair 50 feet with  2 turns activity    Assist        Assist Level: Minimal Assistance - Patient > 75%,Moderate Assistance - Patient 50 - 74%   Wheelchair 150 feet activity     Assist      Assist Level: Moderate Assistance - Patient 50 - 74%   Blood pressure 118/72, pulse 60, temperature 98.7 F (37.1 C), resp. rate 18, height 5\' 10"  (1.778 m), weight 84.4 kg, SpO2 96 %.    Medical Problem List and Plan: 1.  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 2.  Antithrombotics: -DVT/anticoagulation: SCDs             -antiplatelet therapy: Aspirin 81 mg daily and Plavix 75 mg daily x3 weeks then aspirin alone-Off  Plavix  3. Pain Management: Tylenol as needed.  4. Mood: Lexapro 10 mg daily.  Provide emotional support             -antipsychotic agents: N/A 5. Neuropsych: This patient is capable of making decisions on his own behalf. 6. Skin/Wound Care: Routine skin checks  Santyl to wound 7. Fluids/Electrolytes/Nutrition:  8.  Seizure disorder.    Continue Lamictal 225 mg daily and 300 mg nightly  No seizure since rehab admission  9.  Hyperlipidemia.  LDL 131 on 11/13. Continue Lipitor 10. Hypothyroidism: Synthroid 11.  Leukocytosis: resolved last WBC 11/29 6.5K    Afebrile 12.  Bladder incontinence improved, still with occ urge incont, trial low dose ditropan increase to 5mg  13.  Bowel incont improved   14.  Sleep is poor cont melatonin increase to 6mg   Improved LOS: 24 days A FACE TO FACE EVALUATION WAS PERFORMED  Howard White 10/24/2020, 8:45 AM

## 2020-10-25 NOTE — Progress Notes (Signed)
[place] [facility] PHYSICAL MEDICINE & REHABILITATION PROGRESS NOTE   Subjective/Complaints: No complaints this morning Had difficulty using urinal and needs to be cleaned BP soft Other VSS  ROS: Patient denies CP, SOB, N/V/D   Objective:   No results found. No results for input(s): WBC, HGB, HCT, PLT in the last 72 hours. No results for input(s): NA, K, CL, CO2, GLUCOSE, BUN, CREATININE, CALCIUM in the last 72 hours.  Intake/Output Summary (Last 24 hours) at 10/25/2020 1453 Last data filed at 10/25/2020 1105 Gross per 24 hour  Intake 757 ml  Output 650 ml  Net 107 ml        Physical Exam: Vital Signs Blood pressure 97/68, pulse 79, temperature 97.8 F (36.6 C), resp. rate 18, height 5\' 10"  (1.778 m), weight 84.4 kg, SpO2 92 %.  Gen: no distress, normal appearing HEENT: oral mucosa pink and moist, NCAT Cardio: Reg rate Chest: normal effort, normal rate of breathing Abd: soft, non-distended Motor: RUE/RLE: 5/5 proximal distal LUE: Shoulder abduction, elbow flexion/extension 4-/5, handgrip 4-/5 with flexor synergy Brunnstrom 4 MAS 2/3  In L finger and wrist flexors , MAS 1 elbow flexors Wrist ext 2-, finger ext 3/5 Left lower extremity: 4 -/5 proximal distal  Assessment/Plan: 1. Functional deficits which require 3+ hours per day of interdisciplinary therapy in a comprehensive inpatient rehab setting.  Physiatrist is providing close team supervision and 24 hour management of active medical problems listed below.  Physiatrist and rehab team continue to assess barriers to discharge/monitor patient progress toward functional and medical goals  Care Tool:  Bathing    Body parts bathed by patient: Face,Left arm,Chest,Abdomen,Front perineal area,Buttocks,Right upper leg,Left upper leg,Right lower leg,Left lower leg   Body parts bathed by helper: Right arm     Bathing assist Assist Level: Minimal Assistance - Patient > 75%     Upper Body Dressing/Undressing Upper body  dressing   What is the patient wearing?: Pull over shirt    Upper body assist Assist Level: Maximal Assistance - Patient 25 - 49%    Lower Body Dressing/Undressing Lower body dressing      What is the patient wearing?: Pants     Lower body assist Assist for lower body dressing: Maximal Assistance - Patient 25 - 49%     Toileting Toileting    Toileting assist Assist for toileting: Moderate Assistance - Patient 50 - 74%     Transfers Chair/bed transfer  Transfers assist     Chair/bed transfer assist level: Minimal Assistance - Patient > 75% Chair/bed transfer assistive device: Armrests   Locomotion Ambulation   Ambulation assist      Assist level: Moderate Assistance - Patient 50 - 74% Assistive device: No Device Max distance: 15ft   Walk 10 feet activity   Assist     Assist level: Moderate Assistance - Patient - 50 - 74% Assistive device: No Device   Walk 50 feet activity   Assist Walk 50 feet with 2 turns activity did not occur: Safety/medical concerns  Assist level: Moderate Assistance - Patient - 50 - 74% Assistive device: No Device    Walk 150 feet activity   Assist Walk 150 feet activity did not occur: Safety/medical concerns  Assist level: Moderate Assistance - Patient - 50 - 74% Assistive device: No Device    Walk 10 feet on uneven surface  activity   Assist Walk 10 feet on uneven surfaces activity did not occur: Safety/medical concerns         Wheelchair  Assist Will patient use wheelchair at discharge?: Yes Type of Wheelchair: Manual    Wheelchair assist level: Set up assist,Minimal Assistance - Patient > 75% Max wheelchair distance: 104ft    Wheelchair 50 feet with 2 turns activity    Assist        Assist Level: Minimal Assistance - Patient > 75%,Moderate Assistance - Patient 50 - 74%   Wheelchair 150 feet activity     Assist      Assist Level: Moderate Assistance - Patient 50 - 74%   Blood  pressure 97/68, pulse 79, temperature 97.8 F (36.6 C), resp. rate 18, height 5\' 10"  (1.778 m), weight 84.4 kg, SpO2 92 %.    Medical Problem List and Plan: 1.  Left-sided hemiparesis, now with spasticity and facial droop secondary to acute infarct right basal ganglia/corona radiata on 09/26/2020 as well as history of astrocytoma with left frontal craniotomy resection 20 years ago  Conitnue CIR- PT, OT, SLP  WHO nightly   Baclofen 5mg  TID. 2.  Antithrombotics: -DVT/anticoagulation: SCDs             -antiplatelet therapy: Aspirin 81 mg daily and Plavix 75 mg daily x3 weeks then aspirin alone-Off  Plavix  3. Pain Management: Tylenol as needed.  4. Mood: Continue Lexapro 10 mg daily.  Provide emotional support             -antipsychotic agents: N/A 5. Neuropsych: This patient is capable of making decisions on his own behalf. 6. Skin/Wound Care: Continue routine skin checks  Santyl to wound 7. Fluids/Electrolytes/Nutrition:  8.  Seizure disorder. Continue Lamictal 225 mg daily and 300 mg nightly  No seizure since rehab admission  9.  Hyperlipidemia.  LDL 131 on 11/13. Continue Lipitor 40mg  daily.  10. Hypothyroidism: Synthroid 11.  Leukocytosis: resolved last WBC 11/29 6.5K  Afebrile 12.  Bladder incontinence improved, still with occ urge incont, trial low dose ditropan increase to 5mg  13.  Bowel incont improved 14.  Sleep is poor cont melatonin increase to 6mg   Improved LOS: 25 days A FACE TO FACE EVALUATION WAS PERFORMED  Howard White Howard White 10/25/2020, 2:53 PM

## 2020-10-25 NOTE — Plan of Care (Signed)
  Problem: Consults Goal: RH STROKE PATIENT EDUCATION Description: See Patient Education module for education specifics  Outcome: Progressing   Problem: RH SAFETY Goal: RH STG ADHERE TO SAFETY PRECAUTIONS W/ASSISTANCE/DEVICE Description: STG Adhere to Safety Precautions With cues and reminders. Outcome: Progressing   Problem: RH PAIN MANAGEMENT Goal: RH STG PAIN MANAGED AT OR BELOW PT'S PAIN GOAL Description: Pain level less than 4 on scale of 0-10 Outcome: Progressing   Problem: RH KNOWLEDGE DEFICIT Goal: RH STG INCREASE KNOWLEDGE OF HYPERTENSION Description: Pt will be able adhere to medication regimen, dietary and lifestyle modification to better control blood pressure and prevent stroke using handouts and booklets provided with mod I assist.  Outcome: Progressing Goal: RH STG INCREASE KNOWLEDGE OF STROKE PROPHYLAXIS Description: Pt will be able adhere to medication regimen, dietary and lifestyle modification to better control blood pressure and prevent stroke using handouts and booklets provided with mod I assist.  Outcome: Progressing

## 2020-10-26 ENCOUNTER — Inpatient Hospital Stay (HOSPITAL_COMMUNITY): Payer: PPO

## 2020-10-26 NOTE — Progress Notes (Signed)
Occupational Therapy Session Note  Patient Details  Name: Ramirez Fullbright MRN: [id number] Date of Birth: [birth year]  Today's Date: 10/26/2020 OT Individual Time: 1500-1600 OT Individual Time Calculation (min): 60 min    Short Term Goals: Week 1:  OT Short Term Goal 1 (Week 1): Pt will complete UB dressing with min assist for two consecutive sessions. OT Short Term Goal 1 - Progress (Week 1): Not met OT Short Term Goal 2 (Week 1): Pt will complete LB dressing with mod assist sit to stand for two consecutive sessions. OT Short Term Goal 2 - Progress (Week 1): Met OT Short Term Goal 3 (Week 1): Pt will complete LB bathing with min assist sit to stand for two consecutive sessions. OT Short Term Goal 3 - Progress (Week 1): Not met OT Short Term Goal 4 (Week 1): Pt will complete self AAROM exercises for the LUE with supervision following handout. OT Short Term Goal 4 - Progress (Week 1): Not met  Skilled Therapeutic Interventions/Progress Updates:    1:1. Pt received in w/c in bed agreeable to OT. Pt requesting to learn to tie shoes 1 handed. Demonstrated technique but recommended elastic laces. Pt agreeable to elastic laces for ease. PROM of UE in all planes with focus on extension d/t flexor synergy. Installed and pt able to doff/don with S. Pt completes seated NMR with UE ranger working all planes with min resistance through pain free ROM. Pt completes wrist extension on UE ranger and OT facilitates flexion to break up motor pattern. With flat hand paddle donned, Pt reaches forward and stretches hand/arm into extension on edge of table and presses down, extentsion,scap depression for WB/deep proprioceptive input into theraball. Exited session with pt seated in bed, exit alarm on and call light in reach   Therapy Documentation Precautions:  Precautions Precautions: Fall Precaution Comments: [name], fluctuating tone in the LUE Restrictions Weight Bearing Restrictions: No General:    Vital Signs:  Pain:   ADL: ADL Eating: Set up Where Assessed-Eating: Wheelchair Grooming: Supervision/safety Where Assessed-Grooming: Wheelchair Upper Body Bathing: Minimal assistance Where Assessed-Upper Body Bathing: Edge of bed Lower Body Bathing: Moderate assistance Where Assessed-Lower Body Bathing: Edge of bed Upper Body Dressing: Moderate assistance Where Assessed-Upper Body Dressing: Edge of bed Lower Body Dressing: Maximal assistance Where Assessed-Lower Body Dressing: Edge of bed Toileting: Maximal assistance Where Assessed-Toileting: Bedside Commode Toilet Transfer: Maximal assistance Toilet Transfer Method: Stand pivot Science writer: Radiographer, therapeutic: Not assessed Social research officer, government: Not assessed Vision   Perception    Praxis   Exercises:   Other Treatments:     Therapy/Group: Individual Therapy  Tonny Branch 10/26/2020, 12:58 PM

## 2020-10-26 NOTE — Plan of Care (Signed)
  Problem: Consults Goal: RH STROKE PATIENT EDUCATION Description: See Patient Education module for education specifics  Outcome: Progressing   Problem: RH SAFETY Goal: RH STG ADHERE TO SAFETY PRECAUTIONS W/ASSISTANCE/DEVICE Description: STG Adhere to Safety Precautions With cues and reminders. Outcome: Progressing   Problem: RH PAIN MANAGEMENT Goal: RH STG PAIN MANAGED AT OR BELOW PT'S PAIN GOAL Description: Pain level less than 4 on scale of 0-10 Outcome: Progressing   Problem: RH KNOWLEDGE DEFICIT Goal: RH STG INCREASE KNOWLEDGE OF HYPERTENSION Description: Pt will be able adhere to medication regimen, dietary and lifestyle modification to better control blood pressure and prevent stroke using handouts and booklets provided with mod I assist.  Outcome: Progressing Goal: RH STG INCREASE KNOWLEDGE OF STROKE PROPHYLAXIS Description: Pt will be able adhere to medication regimen, dietary and lifestyle modification to better control blood pressure and prevent stroke using handouts and booklets provided with mod I assist.  Outcome: Progressing

## 2020-10-26 NOTE — Progress Notes (Signed)
[facility] PHYSICAL MEDICINE & REHABILITATION PROGRESS NOTE   Subjective/Complaints: No complaints this morning. Denies pain.   ROS: Patient denies CP, SOB, N/V/D   Objective:   No results found. No results for input(s): WBC, HGB, HCT, PLT in the last 72 hours. No results for input(s): NA, K, CL, CO2, GLUCOSE, BUN, CREATININE, CALCIUM in the last 72 hours.  Intake/Output Summary (Last 24 hours) at 10/26/2020 1307 Last data filed at 10/26/2020 0950 Gross per 24 hour  Intake 864 ml  Output 1350 ml  Net -486 ml        Physical Exam: Vital Signs Blood pressure 108/75, pulse (!) 52, temperature 98 F (36.7 C), temperature source Oral, resp. rate 18, height 5\' 10"  (1.778 m), weight 84.4 kg, SpO2 94 %.  Gen: no distress, normal appearing HEENT: oral mucosa pink and moist, NCAT Cardio: Bradycardic Chest: normal effort, normal rate of breathing Abd: soft, non-distended Ext: no edema Skin: intact Motor: RUE/RLE: 5/5 proximal distal LUE: Shoulder abduction, elbow flexion/extension 4-/5, handgrip 4-/5 with flexor synergy Brunnstrom 4 MAS 2/3  In L finger and wrist flexors , MAS 1 elbow flexors Wrist ext 2-, finger ext 3/5 Left lower extremity: 4 -/5 proximal distal  Assessment/Plan: 1. Functional deficits which require 3+ hours per day of interdisciplinary therapy in a comprehensive inpatient rehab setting.  Physiatrist is providing close team supervision and 24 hour management of active medical problems listed below.  Physiatrist and rehab team continue to assess barriers to discharge/monitor patient progress toward functional and medical goals  Care Tool:  Bathing    Body parts bathed by patient: Face,Left arm,Chest,Abdomen,Front perineal area,Buttocks,Right upper leg,Left upper leg,Right lower leg,Left lower leg   Body parts bathed by helper: Right arm     Bathing assist Assist Level: Minimal Assistance - Patient > 75%     Upper Body Dressing/Undressing Upper body  dressing   What is the patient wearing?: Pull over shirt    Upper body assist Assist Level: Maximal Assistance - Patient 25 - 49%    Lower Body Dressing/Undressing Lower body dressing      What is the patient wearing?: Pants     Lower body assist Assist for lower body dressing: Maximal Assistance - Patient 25 - 49%     Toileting Toileting    Toileting assist Assist for toileting: Moderate Assistance - Patient 50 - 74%     Transfers Chair/bed transfer  Transfers assist     Chair/bed transfer assist level: Minimal Assistance - Patient > 75% Chair/bed transfer assistive device: Armrests   Locomotion Ambulation   Ambulation assist      Assist level: Moderate Assistance - Patient 50 - 74% Assistive device: No Device Max distance: 137ft   Walk 10 feet activity   Assist     Assist level: Moderate Assistance - Patient - 50 - 74% Assistive device: No Device   Walk 50 feet activity   Assist Walk 50 feet with 2 turns activity did not occur: Safety/medical concerns  Assist level: Moderate Assistance - Patient - 50 - 74% Assistive device: No Device    Walk 150 feet activity   Assist Walk 150 feet activity did not occur: Safety/medical concerns  Assist level: Moderate Assistance - Patient - 50 - 74% Assistive device: No Device    Walk 10 feet on uneven surface  activity   Assist Walk 10 feet on uneven surfaces activity did not occur: Safety/medical concerns         Wheelchair  Assist Will patient use wheelchair at discharge?: Yes Type of Wheelchair: Manual    Wheelchair assist level: Set up assist,Minimal Assistance - Patient > 75% Max wheelchair distance: 2ft    Wheelchair 50 feet with 2 turns activity    Assist        Assist Level: Minimal Assistance - Patient > 75%,Moderate Assistance - Patient 50 - 74%   Wheelchair 150 feet activity     Assist      Assist Level: Moderate Assistance - Patient 50 - 74%   Blood  pressure 108/75, pulse (!) 52, temperature 98 F (36.7 C), temperature source Oral, resp. rate 18, height 5\' 10"  (1.778 m), weight 84.4 kg, SpO2 94 %.    Medical Problem List and Plan: 1.  Left-sided hemiparesis, now with spasticity and facial droop secondary to acute infarct right basal ganglia/corona radiata on 09/26/2020 as well as history of astrocytoma with left frontal craniotomy resection 20 years ago  Continue CIR- PT, OT, SLP  WHO nightly   Baclofen 5mg  TID. 2.  Antithrombotics: -DVT/anticoagulation: SCDs             -antiplatelet therapy: Aspirin 81 mg daily and Plavix 75 mg daily x3 weeks then aspirin alone-Off  Plavix  3. Pain Management: Tylenol as needed.  4. Mood: Continue Lexapro 10 mg daily.  Provide emotional support             -antipsychotic agents: N/A 5. Neuropsych: This patient is capable of making decisions on his own behalf. 6. Skin/Wound Care: Continue routine skin checks  Santyl to wound 7. Fluids/Electrolytes/Nutrition:  8.  Seizure disorder. Continue Lamictal 225 mg daily and 300 mg nightly  No seizure since rehab admission  9.  Hyperlipidemia.  LDL 131 on 11/13. Continue Lipitor 40mg  daily.  10. Hypothyroidism: Synthroid 11.  Leukocytosis: resolved last WBC 11/29 6.5K  Afebrile 12.  Bladder incontinence improved, still with occ urge incont, trial low dose ditropan increase to 5mg  13.  Bowel incont improved 14.  Sleep is poor cont melatonin increase to 6mg   Improved 15. Bradycardia: continue to monitor TID LOS: 26 days A FACE TO FACE EVALUATION WAS PERFORMED  Rikia Sukhu P Reneisha Stilley 10/26/2020, 1:07 PM

## 2020-10-27 ENCOUNTER — Encounter (HOSPITAL_COMMUNITY): Payer: PPO | Admitting: Speech Pathology

## 2020-10-27 ENCOUNTER — Encounter (HOSPITAL_COMMUNITY): Payer: PPO | Admitting: Occupational Therapy

## 2020-10-27 ENCOUNTER — Inpatient Hospital Stay (HOSPITAL_COMMUNITY): Payer: PPO

## 2020-10-27 ENCOUNTER — Inpatient Hospital Stay (HOSPITAL_COMMUNITY): Payer: PPO | Admitting: Occupational Therapy

## 2020-10-27 ENCOUNTER — Ambulatory Visit (HOSPITAL_COMMUNITY): Payer: PPO

## 2020-10-27 MED ORDER — OXYBUTYNIN CHLORIDE 5 MG PO TABS
5.0000 mg | ORAL_TABLET | Freq: Three times a day (TID) | ORAL | Status: DC
  Administered 2020-10-27 – 2020-10-29 (×6): 5 mg via ORAL
  Filled 2020-10-27 (×6): qty 1

## 2020-10-27 NOTE — Progress Notes (Addendum)
[facility] PHYSICAL MEDICINE & REHABILITATION PROGRESS NOTE   Subjective/Complaints:  Denies pain. Pt not sure if "bladder med " is working , had incont episode , starting to work on using urinal Mod I  ROS: Patient denies CP, SOB, N/V/D   Objective:   No results found. No results for input(s): WBC, HGB, HCT, PLT in the last 72 hours. No results for input(s): NA, K, CL, CO2, GLUCOSE, BUN, CREATININE, CALCIUM in the last 72 hours.  Intake/Output Summary (Last 24 hours) at 10/27/2020 0833 Last data filed at 10/27/2020 0803 Gross per 24 hour  Intake 700 ml  Output 1325 ml  Net -625 ml        Physical Exam: Vital Signs Blood pressure 116/75, pulse (!) 51, temperature 97.6 F (36.4 C), temperature source Oral, resp. rate 16, height 5\' 10"  (1.778 m), weight 84.4 kg, SpO2 96 %.   General: No acute distress Mood and affect are appropriate Heart: Regular rate and rhythm no rubs murmurs or extra sounds Lungs: Clear to auscultation, breathing unlabored, no rales or wheezes Abdomen: Positive bowel sounds, soft nontender to palpation, nondistended Extremities: No clubbing, cyanosis, or edema Skin: No evidence of breakdown, no evidence of rash   Motor: RUE/RLE: 5/5 proximal distal LUE: Shoulder abduction, elbow flexion/extension 4-/5, handgrip 4-/5 with flexor synergy Brunnstrom 4 MAS 2/3  In L finger and wrist flexors , MAS 1 elbow flexors Wrist ext 2-, finger ext 3/5 Left lower extremity: 4 -/5 proximal distal  Assessment/Plan: 1. Functional deficits which require 3+ hours per day of interdisciplinary therapy in a comprehensive inpatient rehab setting.  Physiatrist is providing close team supervision and 24 hour management of active medical problems listed below.  Physiatrist and rehab team continue to assess barriers to discharge/monitor patient progress toward functional and medical goals  Care Tool:  Bathing    Body parts bathed by patient: Face,Left  arm,Chest,Abdomen,Front perineal area,Buttocks,Right upper leg,Left upper leg,Right lower leg,Left lower leg   Body parts bathed by helper: Right arm     Bathing assist Assist Level: Minimal Assistance - Patient > 75%     Upper Body Dressing/Undressing Upper body dressing   What is the patient wearing?: Pull over shirt    Upper body assist Assist Level: Maximal Assistance - Patient 25 - 49%    Lower Body Dressing/Undressing Lower body dressing      What is the patient wearing?: Pants     Lower body assist Assist for lower body dressing: Maximal Assistance - Patient 25 - 49%     Toileting Toileting    Toileting assist Assist for toileting: Moderate Assistance - Patient 50 - 74%     Transfers Chair/bed transfer  Transfers assist     Chair/bed transfer assist level: Minimal Assistance - Patient > 75% Chair/bed transfer assistive device: Armrests   Locomotion Ambulation   Ambulation assist      Assist level: Moderate Assistance - Patient 50 - 74% Assistive device: No Device Max distance: 182ft   Walk 10 feet activity   Assist     Assist level: Moderate Assistance - Patient - 50 - 74% Assistive device: No Device   Walk 50 feet activity   Assist Walk 50 feet with 2 turns activity did not occur: Safety/medical concerns  Assist level: Moderate Assistance - Patient - 50 - 74% Assistive device: No Device    Walk 150 feet activity   Assist Walk 150 feet activity did not occur: Safety/medical concerns  Assist level: Moderate Assistance - Patient -  50 - 74% Assistive device: No Device    Walk 10 feet on uneven surface  activity   Assist Walk 10 feet on uneven surfaces activity did not occur: Safety/medical concerns         Wheelchair     Assist Will patient use wheelchair at discharge?: Yes Type of Wheelchair: Manual    Wheelchair assist level: Set up assist,Minimal Assistance - Patient > 75% Max wheelchair distance: 2ft     Wheelchair 50 feet with 2 turns activity    Assist        Assist Level: Minimal Assistance - Patient > 75%,Moderate Assistance - Patient 50 - 74%   Wheelchair 150 feet activity     Assist      Assist Level: Moderate Assistance - Patient 50 - 74%   Blood pressure 116/75, pulse (!) 51, temperature 97.6 F (36.4 C), temperature source Oral, resp. rate 16, height 5\' 10"  (1.778 m), weight 84.4 kg, SpO2 96 %.    Medical Problem List and Plan: 1.  Left-sided hemiparesis, now with spasticity and facial droop secondary to acute infarct right basal ganglia/corona radiata on 09/26/2020 as well as history of astrocytoma with left frontal craniotomy resection 20 years ago  Continue CIR- PT, OT, SLP  WHO nightly   Baclofen 5mg  TID. 2.  Antithrombotics: -DVT/anticoagulation: SCDs             -antiplatelet therapy: Aspirin 81 mg daily and Plavix 75 mg daily x3 weeks then aspirin alone-Off  Plavix  3. Pain Management: Tylenol as needed.  4. Mood: Continue Lexapro 10 mg daily.  Provide emotional support             -antipsychotic agents: N/A 5. Neuropsych: This patient is capable of making decisions on his own behalf. 6. Skin/Wound Care: Continue routine skin checks  Santyl to wound 7. Fluids/Electrolytes/Nutrition:  8.  Seizure disorder. Continue Lamictal 225 mg daily and 300 mg nightly  No seizure since rehab admission  9.  Hyperlipidemia.  LDL 131 on 11/13. Continue Lipitor 40mg  daily.  10. Hypothyroidism: Synthroid 11.  Leukocytosis: resolved last WBC 11/29 6.5K  Afebrile 12.  Bladder incontinence improved, still with occ urge incont, trial low dose ditropan increase to 5mg  TID13.  Bowel incont improved 14.  Sleep is poor cont melatonin increase to 6mg   Improved 15. Bradycardia: continue to monitor TID LOS: 27 days A FACE TO [place] E Lio Wehrly 10/27/2020, 8:33 AM

## 2020-10-27 NOTE — Discharge Summary (Signed)
Physician Discharge Summary  Patient ID: Howard White MRN: [id number] DOB/AGE: November 04, 1961 59 y.o.  Admit date: 09/30/2020 Discharge date: 10/29/2020  Discharge Diagnoses:  Principal Problem:   Cerebrovascular accident (CVA) of right basal ganglia (HCC) Active Problems:   Basal ganglia stroke (HCC)   Incontinence of feces   Leukocytosis   Seizures (HCC)   Spastic hemiparesis (HCC)   Slow transit constipation   Sleep disturbance Mood stabilization Hyperlipidemia History of brain tumor astrocytoma Hypothyroidism  Discharged Condition: Stable  Significant Diagnostic Studies: No results found.  Labs:  Basic Metabolic Panel: No results for input(s): NA, K, CL, CO2, GLUCOSE, BUN, CREATININE, CALCIUM, MG, PHOS in the last 168 hours.  CBC: No results for input(s): WBC, NEUTROABS, HGB, HCT, MCV, PLT in the last 168 hours.  CBG: No results for input(s): GLUCAP in the last 168 hours.  Family history.  Positive for thyroid disease and diabetes as well as CVA.  Denies any colon cancer esophageal cancer or rectal cancer  Brief HPI:   Howard White is a 59 y.o. 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  Per chart review lives with elderly parents.  Independent prior to admission.  Presented 09/26/2020 left-sided weakness and facial droop.  Cranial CT scan showed no acute intracranial abnormality or significant interval change.  Left frontal craniotomy with resection cavity noted.  CT angiogram of head and neck without large vessel occlusion or limiting proximal stenosis.  MRI of the brain showed a 1.9 x 1.2 cm acute infarct extending from the right corona radiata into the right basal ganglia.  Echocardiogram with ejection fraction of 60 to 65% no wall motion abnormalities.  Admission chemistries unremarkable except potassium 5.5.   Maintained on aspirin and Plavix for CVA prophylaxis x3 weeks then aspirin alone.  Tolerating a regular diet.  Therapy evaluations completed and patient was admitted for a comprehensive rehab program   Hospital Course: Rett Stehlik was admitted to rehab 09/30/2020 for inpatient therapies to consist of PT, ST and OT at least three hours five days a week. Past admission physiatrist, therapy team and rehab RN have worked together to provide customized collaborative inpatient rehab.  Pertaining to patient's acute infarct right basal ganglia corona radiata as well as history of astrocytoma with left frontal craniotomy resection 20 years ago remained stable.  Aspirin and Plavix x3 weeks then aspirin alone.  Patient would follow neurology services.  Lipitor for hyperlipidemia.  Mood stabilization with Lexapro patient was attending full therapies.  Pain management use of baclofen scheduled 3 times daily 5 mg.  History of seizure disorder Lamictal as directed.  Hypothyroidism with hormone supplement as directed.  Bouts of sleep disturbance doing well with melatonin.  Bouts of bladder incontinence no dysuria hematuria improved with the use of ditch or pan.   Blood pressures were monitored on TID basis and controlled      Rehab course: During patient's stay in rehab weekly team conferences were held to monitor patient's progress, set goals and discuss barriers to discharge. At admission, patient required max assist stand pivot transfers moderate assist squat pivot transfers moderate assist sit to supine.  Minimal assist upper body bathing max is lower body bathing mod assist upper body dressing max is lower body dressing  Physical exam.  Blood pressure 118/85 pulse 85 temperature 97.9 respirations 18 oxygen saturation 94% room air Constitutional.  No acute distress  HEENT Head.  Normocephalic and atraumatic Eyes.  Pupils round and reactive to light no discharge.nystagmus Neck.  Supple nontender no JVD without  thyromegaly Cardiac regular rate rhythm without extra sounds or murmur heard Abdomen.  Soft nontender positive bowel sounds without rebound Respiratory effort normal no respiratory distress without wheeze Extremities.  No clubbing cyanosis or edema Skin.  Clean and intact Neuro.  Patient alert no acute distress follows commands oriented x3 Left upper extremity 3/5 SA, EE, 4/5 handgrip Left lower extremity 3/5 hip flexors knee extension 4/5 dorsi plantar flexion   He/  has had improvement in activity tolerance, balance, postural control as well as ability to compensate for deficits. He/ has had improvement in functional use RUE/LUE  and RLE/LLE as well as improvement in awareness.  Supine sitting right edge of bed supervision for safety.  Threaded lower extremity to pants and don shoes max assist.  Ambulates in and out of bathroom 25 feet no assistive device minimal assist from therapist.  Total assist lower body clothing management.  He currently needs max assist for ADLs secondary to left hemiparesis and decreased ability to orient clothing and complete hemidressing technique.  He is able to complete stand pivot transfers to the wheelchair 3 and 1 and shower bench with overall minimal assist.  He was somewhat impulsive.  It was discussed with family need for [place] assistance for safety.  Full family teaching completed plan discharge to home       Disposition: Discharge to home    Diet: Regular  Special Instructions: No driving smoking or alcohol  Medications at discharge 1.  Tylenol as needed 2.  Aspirin 81 mg daily 3.  Lipitor 40 mg daily 4.  Baclofen 5 mg 3 times daily 5.  Colace 200 mg twice daily 6.  Lexapro 10 mg daily 7.  Pepcid 20 mg daily 8.  Flonase 2 sprays each nostril daily 9.Zaditor ophthalmic solution 0.025% 1 drop both eyes twice daily as needed 10.  Lamictal 225 mg daily and 300 mg nightly 11.  Synthroid 112 mcg p.o. nightly 12.  Melatonin 6 mg daily 13.   Multivitamin daily 14.  MiraLAX daily hold for loose stools 15.  Ditropan 5 mg twice daily  30-35 minutes were spent completing discharge summary and discharge planning  Discharge Instructions    Ambulatory referral to Neurology   Complete by: As directed    An appointment is requested in approximately 4 weeks right basal ganglia corona radiata infarction   Ambulatory referral to Physical Medicine Rehab   Complete by: As directed    Moderate complexity follow-up 1 to 2 weeks right basal ganglia corona radiata infarction       Follow-up Information    Kirsteins, Luanna Salk, MD Follow up.   Specialty: Physical Medicine and Rehabilitation Why: Office to call for appointment Contact information: [place] [address] [phone]               Signed: Cathlyn Parsons 10/29/2020, 5:14 AM

## 2020-10-27 NOTE — Progress Notes (Signed)
Patient ID: Howard White, male   DOB: 03-Jul-1961, 59 y.o.   MRN: [id number]   Hemi Height standard WC ordered through [employer]. Patient currently does no qualify for [facility] cushion.  SW will await confirmation of stage 1/stage 2 of patient bottom.  [place], [place]

## 2020-10-27 NOTE — Progress Notes (Signed)
Patient ID: Howard White, male   DOB: 09-06-1961, 59 y.o.   MRN: [id number]   Patient declined by [facility], due to insurance.  [name], [name]

## 2020-10-27 NOTE — Progress Notes (Signed)
Patient ID: Howard White, male   DOB: 12-09-1960, 59 y.o.   MRN: [id number]   Patient accepted by [facility] for follow up [facility] therapies.  [place], [place]

## 2020-10-27 NOTE — Progress Notes (Signed)
Patient ID: Howard White, male   DOB: February 23, 1961, 59 y.o.   MRN: [id number]   Patient referral sent to [facility] for review.

## 2020-10-27 NOTE — Progress Notes (Signed)
Speech Language Pathology Daily Session Note  Patient Details  Name: Howard White MRN: [id number] Date of Birth: [birth year]  Today's Date: 10/27/2020 SLP Individual Time: 1030-1056 SLP Individual Time Calculation (min): 26 min  Short Term Goals: Week 4: SLP Short Term Goal 1 (Week 4): STG=LTG due to remaining length of stay  Skilled Therapeutic Interventions: Pt received from OT (ending their session) and seen for skilled ST targeting completion of pt and family education with pt's mother. SLP facilitated session with review of ST goals and progress throughout admission. Specifically addressed compensatory for speech intelligibility, as well as those to compensate for short term memory deficits, and optimize sequencing, problem solving, planning, and attention during tasks. SLP also covered recommendation for 24/7 supervision and assistance with complex tasks such as medication and finance management, which pt and mother acknowledged. [name] had questions regarding home activities to continue to target cognitive rehabilitation, therefore some suggestions given including some targeted here in CIR but also how to turn ADLs into cognitive tasks.  All questions answered to pt and mother's satisfaction. Pt left sitting in chair with alarm set and needs within reach. Continue per current plan of care.      Pain Pain Assessment Pain Scale: 0-10 Pain Score: 0-No pain  Therapy/Group: Individual Therapy  Howard White 10/27/2020, 7:22 AM

## 2020-10-27 NOTE — Progress Notes (Signed)
Patient ID: Howard White, male   DOB: 1961/11/15, 59 y.o.   MRN: [id number]   Patient declined by [facility] due to insurance. SW has company revisiting referral. SW will continue to reach out to companies.   [place], [place]

## 2020-10-27 NOTE — Progress Notes (Signed)
Physical Therapy Session Note  Patient Details  Name: Howard White MRN: [id number] Date of Birth: 1960/11/16  Today's Date: 10/27/2020 PT Individual Time: 3295-1884 PT Individual Time Calculation (min): 28 min   Short Term Goals: Week 4:  PT Short Term Goal 1 (Week 4): STGs = to LTGs based on ELOS  Skilled Therapeutic Interventions/Progress Updates:    Patient received sitting up in wc, agreeable to PT. He denies pain, but reports frustration for not receiving lunch tray by the time therapy was scheduled. Patient able to propel himself in wc ot therapy gym using R hemi technique and supervision and verbal cues for assist with coordination. Patient displaying increased coordination and ease of using this technique. Patient transferring to wc with MinA HHA and was able to complete x5 sit <> stands from edge of mat with MinA. Verbal cues needed to ensure slow movements and safety, but patient was very responsive to these. Patient then able to propel wc using hemi technique around cone obstacles. He required verbal, tactile and visual cuing to avoid running over cones. Patient attempting to make very sharp turns resulting in running over cones. Discussed with patient moving slowly in wc with wide, intentional turns to ensure that he won't hit anything/knock anything over at home. Patient verbalizing understanding. Patient returning to room in wc, seatbelt alarm on, call light within reach.  Therapy Documentation Precautions:  Precautions Precautions: Fall Precaution Comments: [name], fluctuating tone in the LUE Restrictions Weight Bearing Restrictions: No    Therapy/Group: Individual Therapy  Karoline Caldwell, PT, DPT, CBIS  10/27/2020, 1:30 PM

## 2020-10-27 NOTE — Progress Notes (Addendum)
Occupational Therapy Session Note  Patient Details  Name: Howard White MRN: [id number] Date of Birth: 09-13-1961  Today's Date: 10/27/2020 OT Individual Time: 9833-8250 OT Individual Time Calculation (min): 43 min    Short Term Goals: Week 4:  OT Short Term Goal 1 (Week 4): Conitinue working on established LTGs at min assist overall  Skilled Therapeutic Interventions/Progress Updates:   Session 1: (0949-10:32)  [name] in for education during session.  Took both pt and his mother to the ADL apartment to practice tub transfers with use of the tub bench.  Therapist demonstrated transfers stand pivot to the tub bench and had pt lift his legs into and out of the tub.  Max demonstrational cueing to complete transfer and for slowing down.  [name] completed several transfers at overall min to mod assist level, however pt still with some impulsivity, requiring step by step cueing for each movement. Incdonsistency noted with stand pivot transfers with pt at times completing with min guard but on other's needing mod assist secondary to posterior LOB.  Recommend continued practice and have requested mom to come back in the pm to review bathing and dressing as well as practicing further transfers.  Finished session with return to the room and pt left sitting up in the wheelchair with SLP for next family education session.    Session 2: (5397-6734)  [name] in for session to continue education.  Had pt transfer from the toilet to the shower bench with mod assist stand pivot.  Had him complete donning his socks and shoes with setup once on the seat.  He had doffed his brief and shorts on the 3:1 prior to the transfer with mod assist to get them over his shoes.  Next, educated pt's mom on completion of bathing tasks in sitting. Discussed need for cueing to help him sequence in an organized fashion starting with washing his head, and then working his way down to his feet.  Supervision was needed for  integration of the LUE to stabilize the soap while he removed the lid.  Max assist was needed to integrate it for washing the RUE.  He completed sit to stand in the shower for washing buttocks, but needed max assist for thoroughness secondary to some bowel incontinence.  While drying off on the tub bench, he expressed the need to use the toilet again, and completed functional mobility over to the 3:1 with min assist using the grab bar for support.  Worked on LB dressing while sitting on the toilet with pt demonstrating ability to thread each LE with increased time and min guard assist.  He completed sit to stand with min assist and his mom helping for completion of toilet hygiene and clothing management.  Mod assist was needed for hygiene and clothing.  She then assisted with stand pivot transfer to the wheelchair with min assist.  He applied deodorant as well as his pullover shirt with mod assist following hemi dressing techniques.  He then donned his socks using one-handed technique with max demonstrational cueing and min guard assist.  Educated pt's mom on stretching of the left wrist flexors as well as digit flexors in order to donn resting hand splint.  She was able to then donn the splint with supervision.  Finished session with stand pivot transfer to the bed from the wheelchair with overall min assist.  Increased posterior lean noted in standing, but pt's mother was able to keep him controlled.  Pt transitioned to supine with  supervision and was left with the call button and phone in reach.    Therapy Documentation Precautions:  Precautions Precautions: Fall Precaution Comments: [name], fluctuating tone in the LUE Restrictions Weight Bearing Restrictions: No  Pain: Pain Assessment Pain Scale: Faces Pain Score: 0-No pain ADL:   Therapy/Group: Individual Therapy  Aileene Lanum OTR/L 10/27/2020, 11:09 AM

## 2020-10-27 NOTE — Progress Notes (Signed)
Physical Therapy Session Note  Patient Details  Name: Howard White MRN: [id number] Date of Birth: 09-09-1961  Today's Date: 10/27/2020 PT Individual Time: 0900-0949 PT Individual Time Calculation (min): 49 min   Short Term Goals: Week 4:  PT Short Term Goal 1 (Week 4): STGs = to LTGs based on ELOS  Skilled Therapeutic Interventions/Progress Updates:   Patient received supine in bed with mother at bedside, agreeable to family Ed. He denies pain, but states he has to use urinal. Patient requesting to do so while laying down. Mom able to demonstrate assist with this task. Patient with difficulty placing urinal successfully without assist resulting in some urine spilling onto clothing. TotalA for cleaning up MinA for clothing management in supine. Patient frequently trying to pull pants up without pulling up brief until PT intervened. Patient able to come to sit edge of bed with supervision. Mother demonstrating safe stand pivot transfer to wc with [place] and supervision from PT for safety. Discussed with mother importance of stagger stance to allow patient to complete full anterior weight shift to come to standing and not lose balance backwards. Noting that stagger stance is also important to ensure that mother does not lose balance posteriorly either. Patient and mother verbalizing understanding. PT discussed with patient and mother standard wc vs custom wc. Patient and mother opting to remain with hemi-height standard wc at this time. Patient continues to require at least MinA for wc mobility using R LE/UE and demonstrates significant difficulty coordinating use of R hemi technique despite verbal and tactile cues. Mother able to demonstrate safe stand pivot transfers x3 to therapy mat. Patient remaining in wc at end of session with hand off to OT for further family ed.    Therapy Documentation Precautions:  Precautions Precautions: Fall Precaution Comments: [name], fluctuating tone in the  LUE Restrictions Weight Bearing Restrictions: No    Therapy/Group: Individual Therapy  Karoline Caldwell, PT, DPT, CBIS  10/27/2020, 7:42 AM

## 2020-10-28 ENCOUNTER — Encounter (HOSPITAL_COMMUNITY): Payer: PPO | Admitting: Psychology

## 2020-10-28 ENCOUNTER — Inpatient Hospital Stay (HOSPITAL_COMMUNITY): Payer: PPO | Admitting: Physical Therapy

## 2020-10-28 ENCOUNTER — Inpatient Hospital Stay (HOSPITAL_COMMUNITY): Payer: PPO | Admitting: Speech Pathology

## 2020-10-28 ENCOUNTER — Inpatient Hospital Stay (HOSPITAL_COMMUNITY): Payer: PPO | Admitting: Occupational Therapy

## 2020-10-28 MED ORDER — DOCUSATE SODIUM 100 MG PO CAPS: 200.0000 mg | ORAL_CAPSULE | Freq: Two times a day (BID) | ORAL | 0 refills | Status: AC

## 2020-10-28 MED ORDER — KETOTIFEN FUMARATE 0.025 % OP SOLN: 1.0000 [drp] | Freq: Two times a day (BID) | OPHTHALMIC | 0 refills | Status: AC | PRN

## 2020-10-28 MED ORDER — BACLOFEN 5 MG PO TABS: 5.0000 mg | ORAL_TABLET | Freq: Three times a day (TID) | ORAL | 0 refills | Status: DC

## 2020-10-28 MED ORDER — POLYETHYLENE GLYCOL 3350 17 G PO PACK: 17.0000 g | PACK | Freq: Every day | ORAL | 0 refills | Status: AC

## 2020-10-28 MED ORDER — ACETAMINOPHEN 325 MG PO TABS: 650.0000 mg | ORAL_TABLET | Freq: Four times a day (QID) | ORAL | Status: DC | PRN

## 2020-10-28 MED ORDER — OXYBUTYNIN CHLORIDE 5 MG PO TABS: 5.0000 mg | ORAL_TABLET | Freq: Three times a day (TID) | ORAL | 0 refills | Status: DC

## 2020-10-28 MED ORDER — ATORVASTATIN CALCIUM 40 MG PO TABS: 40.0000 mg | ORAL_TABLET | Freq: Every day | ORAL | 0 refills | Status: DC

## 2020-10-28 MED ORDER — MELATONIN 3 MG PO TABS: 6.0000 mg | ORAL_TABLET | Freq: Every day | ORAL | 0 refills | Status: AC

## 2020-10-28 MED ORDER — ESCITALOPRAM OXALATE 10 MG PO TABS: 10.0000 mg | ORAL_TABLET | Freq: Every day | ORAL | 0 refills | Status: DC

## 2020-10-28 MED ORDER — SYNTHROID 112 MCG PO TABS: 112.0000 ug | ORAL_TABLET | Freq: Every day | ORAL | 0 refills | Status: AC

## 2020-10-28 MED ORDER — CALCIUM CARBONATE ANTACID 500 MG PO CHEW: 1.0000 | CHEWABLE_TABLET | Freq: Every day | ORAL | 0 refills | Status: AC

## 2020-10-28 MED ORDER — FAMOTIDINE 20 MG PO TABS: 20.0000 mg | ORAL_TABLET | Freq: Every day | ORAL | 0 refills | Status: AC

## 2020-10-28 NOTE — Progress Notes (Signed)
Occupational Therapy Discharge Summary  Patient Details  Name: Howard White MRN: [id number] Date of Birth: Jun 27, 1961  Today's Date: 10/28/2020 OT Individual Time: 1005-1106 OT Individual Time Calculation (min): 61 min   Session Note:  Pt in wheelchair to start session, finishing up using the urinal with setup from the wheelchair.  Mod instructional cueing for sequencing placing urinal down after use, wiping himself, and then managing his clothing.  He moved over to the sink in the wheelchair for work on washing his hands and for completion of oral hygiene.  He was able to complete oral hygiene with supervision and min instructional cueing to sequence.  He was able to integrated the LUE as a gross assist for holding the toothpaste to open and place the top back on.  He washed his hands with supervision and min instructional cueing to sequence.  Next, took pt down to the therapy gym where he transferred to the mat from the wheelchair with min assist.  Educated pt on self AAROM exercises for the LUE with pt return demonstrating exercises following handouts with max demonstrational cueing for technique.  Had him complete 3-5 reps for shoulder flexion, elbow flexion, wrist extension, and digit extension.  Also completed discharge information looking at vision, strength, AROM.  Finished session with return to the room and with transfer back to the bed with min assist stand pivot.  Call button and phone in reach with safety alarm in place.    Patient has met 9 of 12 long term goals due to improved activity tolerance, improved balance, postural control, ability to compensate for deficits, functional use of  LEFT upper extremity, improved attention, improved awareness and improved coordination.  Patient to discharge at overall Mod Assist level.  Patient's care partner is independent to provide the necessary physical and cognitive assistance at discharge.    Reasons goals not met: Pt continues to need  supervision for grooming tasks, mod assist for toileting tasks, and mod assist for dynamic standing balance  Recommendation:  Patient will benefit from ongoing skilled OT services in home health setting to continue to advance functional skills in the area of BADL and Reduce care partner burden.  Pt continues to demonstrate decreased memory, decreased awareness, decreased LUE and LLE strength and functional use as well as decreased balance for completion of selfcare tasks and functional transfers.  Feel he will benefit from continued [facility] for continued work toward supervision level goals or better.  Recommend 24 hour assist at discharge for safety and pt's mother is understanding of this.    Equipment: tub bench, 3:1, RW  Reasons for discharge: treatment goals met and discharge from hospital  Patient/family agrees with progress made and goals achieved: Yes  OT Discharge Precautions/Restrictions  Precautions Precautions: Fall;Other (comment) Precaution Comments: L hemiparesis, fluctuating tone in the LUE Restrictions Weight Bearing Restrictions: No General   Vital Signs  Pain Pain Assessment Pain Scale: Faces Pain Score: 0-No pain ADL ADL Eating: Modified independent Where Assessed-Eating: Wheelchair Grooming: Setup Where Assessed-Grooming: Wheelchair Upper Body Bathing: Minimal assistance Where Assessed-Upper Body Bathing: Shower,Chair Lower Body Bathing: Minimal assistance Where Assessed-Lower Body Bathing: Chair,Shower Upper Body Dressing: Moderate assistance Where Assessed-Upper Body Dressing: Wheelchair Lower Body Dressing: Moderate assistance Where Assessed-Lower Body Dressing: Wheelchair,Sitting at sink,Standing at sink Toileting: Moderate assistance Where Assessed-Toileting: Bedside Commode Toilet Transfer: Minimal assistance Toilet Transfer Method: Stand pivot Toilet Transfer Equipment: Bedside commode Tub/Shower Transfer: Minimal assistance Tub/Shower Transfer  Method: Administrator, arts: Facilities manager: Minimal  assistance Social research officer, government Method: Radiographer, therapeutic: Gaffer Baseline Vision/History: Wears glasses (has prism glasses secondary to history of diplopia from previous brain tumor) Wears Glasses: At all times Patient Visual Report: No change from baseline Vision Assessment?: Yes Eye Alignment: Within Functional Limits Ocular Range of Motion: Within Functional Limits Alignment/Gaze Preference: Within Defined Limits Tracking/Visual Pursuits: Requires cues, head turns, or add eye shifts to track;Other (comment) (Pt able to track in all quadrants but would occasionally lose attention on therapist's finger and stop tracking or his eyes would shift off of the target.  this was not consistent with any direction and would happen randomly.) Saccades: Other (comment) (Not tested at discharge) Convergence: Other (comment) (to tested at D/C) Visual Fields: No apparent deficits Diplopia Assessment:  (No diplopia noted with testing) Perception  Perception: Impaired Inattention/Neglect: Does not attend to left side of body Comments: Pt continues to hold the LUE in flexed posturing without awareness of this unless cued. Praxis Praxis: Impaired Praxis Impairment Details: Ideomotor Praxis-Other Comments: Motor impersistence with multi tasking Cognition Overall Cognitive Status: Impaired/Different from baseline Arousal/Alertness: Awake/alert Orientation Level: Oriented X4 Attention: Selective;Sustained Focused Attention: Appears intact Sustained Attention: Impaired Sustained Attention Impairment: Verbal basic;Functional basic Selective Attention: Impaired Selective Attention Impairment: Verbal basic;Functional basic Memory: Impaired Memory Impairment: Decreased short term memory Decreased Short Term Memory: Verbal basic;Functional complex Awareness:  Impaired Awareness Impairment: Intellectual impairment;Emergent impairment Problem Solving: Impaired Problem Solving Impairment: Verbal complex;Functional basic Safety/Judgment: Impaired Comments: Pt needs step by step cueing to slow down and for sequencing most ADL tasks and transfers. Sensation Sensation Light Touch: Impaired Detail Light Touch Impaired Details: Impaired LUE Hot/Cold: Not tested Proprioception: Impaired Detail Proprioception Impaired Details: Impaired LUE Stereognosis: Not tested Additional Comments: Pt able to detect light touch throughout the LUE but reports decreased sensation in the left thumb compared to the right.  Slight inconsistency with proprioception testing in the left wrist and hand noted. Motor  Motor Motor: Hemiplegia;Abnormal postural alignment and control;Abnormal tone;Motor impersistence Motor - Discharge Observations: Pt still with increased tone in the left arm as well as increased trunk flexion both forward and left laterally during mobility.  Decreased ability to maintain upright trunk and divide attention with another motor task simultaneously. Mobility  Bed Mobility Bed Mobility: Supine to Sit;Sit to Supine Supine to Sit: Supervision/Verbal cueing Sit to Supine: Supervision/Verbal cueing Transfers Sit to Stand: Minimal Assistance - Patient > 75% Stand to Sit: Minimal Assistance - Patient > 75%  Trunk/Postural Assessment  Cervical Assessment Cervical Assessment: Exceptions to [facility] (cervical flexion and protraction noted) Thoracic Assessment Thoracic Assessment: Exceptions to [facility] (slight thoracic rounding with left scapular winging at medial border) Lumbar Assessment Lumbar Assessment: Exceptions to [facility] (posterior pelvic tilt noted at times)  Balance Balance Balance Assessed: Yes Static Sitting Balance Static Sitting - Balance Support: Feet supported Static Sitting - Level of Assistance: 7: Independent Dynamic Sitting Balance Dynamic  Sitting - Balance Support: Feet unsupported;During functional activity Dynamic Sitting - Level of Assistance: 5: Stand by assistance Static Standing Balance Static Standing - Balance Support: During functional activity Static Standing - Level of Assistance: 4: Min assist Dynamic Standing Balance Dynamic Standing - Balance Support: During functional activity Dynamic Standing - Level of Assistance: 3: Mod assist Extremity/Trunk Assessment RUE Assessment RUE Assessment: Within Functional Limits Passive Range of Motion (PROM) Comments: PROM WFLS for all joints Active Range of Motion (AROM) Comments: AROM WFLS for all joints General Strength Comments: strength 5/5 throughout LUE Assessment LUE  Assessment: Exceptions to [facility] Passive Range of Motion (PROM) Comments: PROM WFLS for shoulder and elbow with slight increase in elbow flexor tone at end range when relaxed.  This tone increases with attempted functional movement.  Increased flexor tone noted in wrist and digits, at 3/4 on the Modified Ashworth Scale Active Range of Motion (AROM) Comments: Brunnstrum stage IV in the arm and hand General Strength Comments: Pt can use the LUE as a gross assist for holding items to be opened with supervision.  He continues to need max assist for use with bathing tasks secondary to increased flexor tone in the elbow, wrist, and hand.  Resting hand splint issued for night and to help reduce tone.   Zaquan Duffner OTR/L 10/28/2020, 12:46 PM

## 2020-10-28 NOTE — Progress Notes (Signed)
Patient ID: Howard White, male   DOB: Mar 26, 1961, 59 y.o.   MRN: [id number] Follow up with the patient regarding discharge preparation. Patient noted he is ready for discharge and has no questions or concerns regarding medications or follow up services scheduled. Margarito Liner

## 2020-10-28 NOTE — Progress Notes (Signed)
[facility] PHYSICAL MEDICINE & REHABILITATION PROGRESS NOTE   Subjective/Complaints: No complaints. Working well with therapy this morning.  ROS: Patient denies CP, SOB, N/V/D    Objective:   No results found. No results for input(s): WBC, HGB, HCT, PLT in the last 72 hours. No results for input(s): NA, K, CL, CO2, GLUCOSE, BUN, CREATININE, CALCIUM in the last 72 hours.  Intake/Output Summary (Last 24 hours) at 10/28/2020 1110 Last data filed at 10/28/2020 0615 Gross per 24 hour  Intake --  Output 400 ml  Net -400 ml        Physical Exam: Vital Signs Blood pressure 111/65, pulse (!) 52, temperature 97.9 F (36.6 C), resp. rate 16, height 5\' 10"  (1.778 m), weight 84.4 kg, SpO2 97 %.   Gen: no distress, normal appearing HEENT: oral mucosa pink and moist, NCAT Cardio: Reg rate Chest: normal effort, normal rate of breathing Abd: soft, non-distended Ext: no edema Skin: intact  Motor: RUE/RLE: 5/5 proximal distal LUE: Shoulder abduction, elbow flexion/extension 4-/5, handgrip 4-/5 with flexor synergy Brunnstrom 4 MAS 2/3  In L finger and wrist flexors , MAS 1 elbow flexors Wrist ext 2-, finger ext 3/5 Left lower extremity: 4 -/5 proximal distal  Assessment/Plan: 1. Functional deficits which require 3+ hours per day of interdisciplinary therapy in a comprehensive inpatient rehab setting.  Physiatrist is providing close team supervision and 24 hour management of active medical problems listed below.  Physiatrist and rehab team continue to assess barriers to discharge/monitor patient progress toward functional and medical goals  Care Tool:  Bathing    Body parts bathed by patient: Face,Left arm,Chest,Abdomen,Front perineal area,Right upper leg,Left upper leg,Right lower leg,Left lower leg   Body parts bathed by helper: Right arm,Buttocks     Bathing assist Assist Level: Minimal Assistance - Patient > 75%     Upper Body Dressing/Undressing Upper body dressing    What is the patient wearing?: Pull over shirt    Upper body assist Assist Level: Moderate Assistance - Patient 50 - 74%    Lower Body Dressing/Undressing Lower body dressing      What is the patient wearing?: Pants     Lower body assist Assist for lower body dressing: Moderate Assistance - Patient 50 - 74%     Toileting Toileting    Toileting assist Assist for toileting: Moderate Assistance - Patient 50 - 74%     Transfers Chair/bed transfer  Transfers assist     Chair/bed transfer assist level: Moderate Assistance - Patient 50 - 74% Chair/bed transfer assistive device: Armrests   Locomotion Ambulation   Ambulation assist      Assist level: Moderate Assistance - Patient 50 - 74% Assistive device: No Device Max distance: 138ft   Walk 10 feet activity   Assist     Assist level: Moderate Assistance - Patient - 50 - 74% Assistive device: No Device   Walk 50 feet activity   Assist Walk 50 feet with 2 turns activity did not occur: Safety/medical concerns  Assist level: Moderate Assistance - Patient - 50 - 74% Assistive device: No Device    Walk 150 feet activity   Assist Walk 150 feet activity did not occur: Safety/medical concerns  Assist level: Moderate Assistance - Patient - 50 - 74% Assistive device: No Device    Walk 10 feet on uneven surface  activity   Assist Walk 10 feet on uneven surfaces activity did not occur: Safety/medical concerns         Wheelchair  Assist Will patient use wheelchair at discharge?: Yes Type of Wheelchair: Manual    Wheelchair assist level: Minimal Assistance - Patient > 75% Max wheelchair distance: 150    Wheelchair 50 feet with 2 turns activity    Assist        Assist Level: Minimal Assistance - Patient > 75%   Wheelchair 150 feet activity     Assist      Assist Level: Minimal Assistance - Patient > 75%   Blood pressure 111/65, pulse (!) 52, temperature 97.9 F (36.6 C),  resp. rate 16, height 5\' 10"  (1.778 m), weight 84.4 kg, SpO2 97 %.    Medical Problem List and Plan: 1.  Left-sided hemiparesis, now with spasticity and facial droop secondary to acute infarct right basal ganglia/corona radiata on 09/26/2020 as well as history of astrocytoma with left frontal craniotomy resection 20 years ago  Continue CIR- PT, OT, SLP  WHO nightly   Baclofen 5mg  TID. 2.  Antithrombotics: -DVT/anticoagulation: SCDs             -antiplatelet therapy: Aspirin 81 mg daily and Plavix 75 mg daily x3 weeks then aspirin alone-Off  Plavix  3. Pain Management: Continue Tylenol as needed.  4. Mood: Continue Lexapro 10 mg daily.  Provide emotional support             -antipsychotic agents: N/A 5. Neuropsych: This patient is capable of making decisions on his own behalf. 6. Skin/Wound Care: Continue routine skin checks  Santyl to wound 7. Fluids/Electrolytes/Nutrition:  8.  Seizure disorder. Continue Lamictal 225 mg daily and 300 mg nightly  No seizure since rehab admission  9.  Hyperlipidemia.  LDL 131 on 11/13. Continue Lipitor 40mg  daily.  10. Hypothyroidism: Synthroid 11.  Leukocytosis: resolved last WBC 11/29 6.5K  Afebrile 12.  Bladder incontinence improved, still with occ urge incont, trial low dose ditropan increase to 5mg  TID13.  Bowel incont improved 14.  Sleep is poor cont melatonin increase to 6mg   Improved 15. Bradycardia:   12/14: HR in 50s-60s, continue to monitor TID LOS: 28 days A FACE TO FACE EVALUATION WAS PERFORMED  Howard White 10/28/2020, 11:10 AM

## 2020-10-28 NOTE — Progress Notes (Signed)
Inpatient Rehabilitation Care Coordinator  Discharge Note  The overall goal for the admission was met for:   Discharge location: Yes, Home  Length of Stay: Yes, 29 Days  Discharge activity level: Yes  Home/community participation: Yes  Services provided included: MD, RD, PT, OT, SLP, RN, CM, TR, Pharmacy, Neuropsych and SW  Financial Services: Private Insurance: [employer]  Follow-up services arranged: Home Health: [facility]  Comments (or additional information): PT OT [facility] Hemi Wheelchair , [facility] Bed, Bedside Commode, Tub Transfer Bench  Patient/Family verbalized understanding of follow-up arrangements: Yes  Individual responsible for coordination of the follow-up plan: [name] [phone]  Confirmed correct DME delivered: [name] 10/28/2020    [name]

## 2020-10-28 NOTE — Progress Notes (Signed)
Physical Therapy Discharge Summary  Patient Details  Name: Howard White MRN: [id number] Date of Birth: [birth year]  Today's Date: 10/28/2020 PT Individual Time: 0805-0904 PT Individual Time Calculation (min): 59 min    Patient has met 8 of 9 long term goals due to improved activity tolerance, improved balance, improved postural control, increased strength, ability to compensate for deficits, functional use of  left lower extremity, improved attention, improved awareness and improved coordination.  Patient to discharge at a wheelchair level requiring supervision for w/c mobility and CGA/min assist for transfers. Patient's care partner attended hands-on family education and training and is independent to provide the necessary physical and cognitive assistance at discharge.  Reasons goals not met: Pt requires fluctuating levels of assistance for transfers based on fatigue and attention to task between CGA and min assist. Pt can ambulate goal distance in controlled and home environments with skilled min assist of a PT; however, is not safe to ambulate in home environment with family. Stair goal is not applicable as pt has level home entry and is planning to live on main level.  Recommendation:  Patient will benefit from ongoing skilled PT services in home health setting to continue to advance safe functional mobility, address ongoing impairments in L hemiparesis, L UE hypertonia, L attention, motor planning, gait training, dynamic standing balance, transfer training, and minimize fall risk.  Equipment: 16x18 hemi-height wheelchair; RW; hospital bed   Reasons for discharge: treatment goals met and discharge from hospital  Patient/family agrees with progress made and goals achieved: Yes   Skilled Therapeutic Interventions/Progress Updates:  Pt received supine in bed with no family present but pt agreeable to therapy session. Supine>sitting R EOB, HOB partially elevated and using bedrail, with  supervision. Sitting EOB donned shorts with assist for time management - donned shoes with set-up assist. Sit>stand EOB>R UE support on therapist with min assist for balance as pt demonstrates increased posterior lean this AM - pulled pants up over hips total assist for time. L stand pivot EOB>w/c, no AD but using R UE support on therapist, with min assist for balance. R hemi-technique w/c propulsion using varying R UE and R LE combination to only R LE with supervision and significantly increased time as pt continues to have difficulty motor planning this task - cuing for improvement and need to use R LE to turn w/c towards R. Transported remainder of distance to ortho gym. Simulated car transfer (sedan height) using R UE support on therapist and min assist for balance as pt continues to demon minor posterior lean - cuing for sequencing and not to pull no car door for safety. Gait training ~177f, no AD, with min assist initially then with longer distances and increased frequency of turns required mod assist when turning due to impaired LE coordination with impaired L LE step positioning - demos improved trunk in midline (decreased R lean) and improved L LE foot clearance - pt performing exaggerated R arm swing pretending that appears to help with symmetry of LE foot clearance and step lengths. [facility] demonstrates clinically significant improvement in his score though continues to have at increased fall risk as noted by score of  29/56.  (<36= high risk for falls, close to 100%; 37-45 significant >80%; 46-51 moderate >50%; 52-55 lower >25%). Transported back to room and left sitting in w/c with needs in reach and seat belt alarm on.  PT Discharge Precautions/Restrictions Precautions Precautions: Fall;Other (comment) Precaution Comments: L hemiparesis, fluctuating tone in  the LUE Restrictions Weight Bearing Restrictions: No Pain Pain Assessment Pain Scale: 0-10 Pain  Score: 0-No pain (reports he was having pain between his shoulder blades but premedicated) Perception  Perception Perception: Impaired Inattention/Neglect: Does not attend to left side of body;Does not attend to left visual field Comments: frequently unaware of LUE positioning and decreased L attention to environment often forgetting to unlock L w/c brake without verbal cuing or bumping into items on that side during w/c mobility Praxis Praxis: Impaired Praxis Impairment Details: Ideomotor;Motor planning  Cognition  Overall Cognitive Status: Impaired/Different from baseline Arousal/Alertness: Awake/alert Attention: Focused Focused Attention: Appears intact Sustained Attention: Impaired Selective Attention: Impaired Memory: Impaired Safety/Judgment: Impaired Comments: Improved but continues to require sequential cuing for safe performance of certain mobility tasks Sensation Sensation Light Touch: Impaired Detail Peripheral sensation comments: reports sensation on L UE and LE feels different than R UE and LE Light Touch Impaired Details: Impaired LUE;Impaired LLE Hot/Cold: Not tested Proprioception: Impaired Detail Proprioception Impaired Details: Impaired LUE;Impaired LLE Stereognosis: Not tested Coordination Gross Motor Movements are Fluid and Coordinated: No Coordination and Movement Description: L hemiparesis along with impaired motor planning and coordination Motor  Motor Motor: Hemiplegia;Abnormal postural alignment and control;Abnormal tone;Motor impersistence Motor - Discharge Observations: L hemiparesis (UE>LE), L UE hypertonia with flexor synergy, poor trunk control in standing/ambulating with R lateral trunk flexion during movements, impaired dual-taks resulting in worsening balance  Mobility Bed Mobility Bed Mobility: Supine to Sit;Sit to Supine Supine to Sit: Supervision/Verbal cueing Sit to Supine: Supervision/Verbal cueing Transfers Transfers: Sit to Stand;Stand  to Sit;Stand Pivot Transfers Sit to Stand: Minimal Assistance - Patient > 75%;Contact Guard/Touching assist Stand to Sit: Minimal Assistance - Patient > 75%;Contact Guard/Touching assist Stand Pivot Transfers: Minimal Assistance - Patient > 75%;Contact Guard/Touching assist Stand Pivot Transfer Details: Verbal cues for precautions/safety;Verbal cues for technique;Visual cues/gestures for sequencing;Verbal cues for sequencing;Tactile cues for sequencing;Tactile cues for posture Transfer (Assistive device): 1 person hand held assist Locomotion  Gait Ambulation: Yes Gait Assistance: Moderate Assistance - Patient 50-74%;Minimal Assistance - Patient > 75% Gait Distance (Feet): 150 Feet Assistive device: None Gait Assistance Details: Tactile cues for posture;Tactile cues for sequencing;Tactile cues for weight shifting;Verbal cues for technique;Verbal cues for gait pattern;Verbal cues for precautions/safety;Verbal cues for sequencing Gait Gait: Yes Gait Pattern: Impaired Gait Pattern: Decreased step length - left;Decreased stance time - left;Decreased hip/knee flexion - left;Decreased dorsiflexion - left;Poor foot clearance - left;Lateral trunk lean to right Gait velocity: decreased Stairs / Additional Locomotion Stairs: No Architect: Yes Wheelchair Assistance: Chartered loss adjuster: Right upper extremity;Right lower extremity Wheelchair Parts Management: Needs assistance;Supervision/cueing (assist for leg rests, cuing for brakes) Distance: 132f  Trunk/Postural Assessment  Cervical Assessment Cervical Assessment: Ex[facility] to [facility](c[facility] protraction/forward head) Thoracic Assessment Thoracic Assessment: Exceptions to [facility](increased thoracic kyphosis) Lumbar Assessment Lumbar Assessment: Exceptions to [facility](posterior pelvic tilt in sitting) Postural Control Postural Control: Deficits on evaluation Righting Reactions:  delayed Postural Limitations: decreased  Balance Balance Balance Assessed: Yes Standardized Balance Assessment Standardized Balance Assessment: Berg Balance Test Berg Balance Test Sit to Stand: Able to stand without using hands and stabilize independently Standing Unsupported: Able to stand 2 minutes with supervision Sitting with Back Unsupported but Feet Supported on Floor or Stool: Able to sit 2 minutes under supervision Stand to Sit: Sits safely with minimal use of hands Transfers: Able to transfer with verbal cueing and /or supervision Standing Unsupported with Eyes Closed: Able to stand 10 seconds with supervision Standing Ubsupported with  Feet Together: Able to place feet together independently and stand for 1 minute with supervision From Standing, Reach Forward with Outstretched Arm: Reaches forward but needs supervision From Standing Position, Pick up Object from Floor: Able to pick up shoe, needs supervision From Standing Position, Turn to Look Behind Over each Shoulder: Needs supervision when turning Turn 360 Degrees: Needs assistance while turning Standing Unsupported, Alternately Place Feet on Step/Stool: Needs assistance to keep from falling or unable to try Standing Unsupported, One Foot in Front: Needs help to step but can hold 15 seconds Standing on One Leg: Tries to lift leg/unable to hold 3 seconds but remains standing independently Total Score: 29 Static Sitting Balance Static Sitting - Balance Support: Feet supported Static Sitting - Level of Assistance: 5: Stand by assistance;7: Independent Dynamic Sitting Balance Dynamic Sitting - Balance Support: Feet unsupported;During functional activity Dynamic Sitting - Level of Assistance: 5: Stand by assistance Static Standing Balance Static Standing - Balance Support: During functional activity Static Standing - Level of Assistance: 5: Stand by assistance;4: Min assist Dynamic Standing Balance Dynamic Standing - Balance  Support: During functional activity;Right upper extremity supported Dynamic Standing - Level of Assistance: 4: Min assist Extremity Assessment      RLE Assessment RLE Assessment: Within Functional Limits LLE Assessment LLE Assessment: Exceptions to [facility] LLE Strength Left Hip Flexion: 4/5 Left Knee Flexion: 3+/5 Left Knee Extension: 4+/5 Left Ankle Dorsiflexion: 3+/5 Left Ankle Plantar Flexion: 3+/5    Tawana Scale , PT, DPT, CSRS  10/28/2020, 7:53 AM

## 2020-10-28 NOTE — Progress Notes (Signed)
Physical Therapy Session Note  Patient Details  Name: Howard White MRN: [id number] Date of Birth: [birth year]  Today's Date: 10/28/2020 PT Individual Time: 1315-1346 PT Individual Time Calculation (min): 31 min   Short Term Goals: Week 1:  PT Short Term Goal 1 (Week 1): Pt will transfer to and from [facility] with mod assist consistently PT Short Term Goal 1 - Progress (Week 1): Progressing toward goal PT Short Term Goal 2 (Week 1): Pt will propell WC 147f with min assist PT Short Term Goal 2 - Progress (Week 1): Progressing toward goal PT Short Term Goal 3 (Week 1): Pt will ambulater 368fwith mod assist and LRAD PT Short Term Goal 3 - Progress (Week 1): Progressing toward goal Week 2:  PT Short Term Goal 1 (Week 2): Pt [facility] consistently PT Short Term Goal 1 - Progress (Week 2): Met PT Short Term Goal 2 (Week 2): Pt will propell WC [id number] min assist PT Short Term Goal 2 - Progress (Week 2): Progressing toward goal PT Short Term Goal 3 (Week 2): Pt will ambulater [id number] mod assist and LRAD PT Short Term Goal 3 - Progress (Week 2): Met PT Short Term Goal 4 (Week 2): Patient will transition supine <> sit edge of bed with CGA and verbal cues as needed consistently PT Short Term Goal 4 - Progress (Week 2): Met  Skilled Therapeutic Interventions/Progress Updates:    pt received in bed and agreeable to therapy. Pt requested to use urinal in bed prior to getting up and required PT to hold urinal and place it. Pt (+) bladder void. Pt then directed in supine>sit CGA for safety and Sit to stand and pivot to WC CGA. Pt [facility], directed in Sit to stand [facility] gait [facility], pt benefited from VC [facility] poor attempts to stand and "reset" to complete, with this pt able to complete Sit to stand at CGA [facility]hout AD. Pt directed in gait training without AD for 150' with CGA-min A for stability with noted decreased foot clearance  on affected limb and benefited from VC to improve this, x2 VC for stopping and restarting gait to improve overall pattern. Pt then required rest break in sitting, directed in additional gait for 100' min A and one LOB posteriorly mod A to correct however pt attempting to duel task with talking during gait and lost balance, pt reported "I can't walk and talk" and then he was able to correct rest of gait pattern. Pt requested to return to bed at end of session and [facility] time. Pt directed in Sit [facility] to bedside at min A. Sit>supine CGA. Pt left in bed, alarm set, All needs in reach and in good condition. Call light in hand.    Therapy Documentation Precautions:  Precautions Precautions: Fall,Other (comment) Precaution Comments: L hemiparesis, fluctuating tone in the LUE Restrictions Weight Bearing Restrictions: No General:   Vital Signs:  Pain:   Mobility: Bed Mobility Bed Mobility: Supine to Sit;Sit to Supine Supine to Sit: Supervision/Verbal cueing Sit to Supine: Supervision/Verbal cueing Transfers Transfers: Sit to Stand;Stand to Sit;Stand Pivot Transfers Sit to Stand: Minimal Assistance - Patient > 75% Stand to Sit: Minimal Assistance - Patient > 75% Stand Pivot Transfers: Minimal Assistance - Patient > 75% Locomotion :    Trunk/Postural Assessment : Cervical Assessment Cervical Assessment: Exceptions to  WFL (cervical flexion and protraction noted) Thoracic Assessment Thoracic Assessment: Exceptions to [facility] (slight thoracic rounding with left scapular winging at medial border) Lumbar Assessment Lumbar Assessment: Exceptions to [facility] (posterior pelvic tilt noted at times)  Balance: Balance Balance Assessed: Yes Static Sitting Balance Static Sitting - Balance Support: Feet supported Static Sitting - Level of Assistance: 7: Independent Dynamic Sitting Balance Dynamic Sitting - Balance Support: Feet unsupported;During functional  activity Dynamic Sitting - Level of Assistance: 5: Stand by assistance Static Standing Balance Static Standing - Balance Support: During functional activity Static Standing - Level of Assistance: 4: Min assist Dynamic Standing Balance Dynamic Standing - Balance Support: During functional activity Dynamic Standing - Level of Assistance: 3: Mod assist Exercises:   Other Treatments:      Therapy/Group: Individual Therapy  Junie Panning 10/28/2020, 2:24 PM

## 2020-10-28 NOTE — Progress Notes (Signed)
Speech Language Pathology Discharge Summary  Patient Details  Name: Howard White MRN: [id number] Date of Birth: 1961/08/08  Today's Date: 10/28/2020 SLP Individual Time: 1400-1455 SLP Individual Time Calculation (min): 55 min   Skilled Therapeutic Interventions:  Pt was seen for skilled ST targeting cognitive skills. SLP facilitated session with administration of Cognistat evaluation as means of post-test for this admission. Pt scored WFL for most subtests with the exception of short term memory, calculations, and Ecologist. During a familiar semi-complex card task (Blink), pt demonstrated noteworthy improvement in problem solving and emergent awareness, as well as planning/organization skills. Overall Supervision A verbal and visual cues required for problem solving and error awareness, Min A for recall within task and use of strategies for planning. SLP also assisted pt with creating a list of examples of home activities to continue to target cognitive-rehabilitation at home. Pt left laying in bed with alarm set and needs within reach. Continue per current plan of care.   Patient has met 4 of 5 long term goals.  Patient to discharge at overall Supervision;Min level.  Reasons goals not met: increased Min A cueing required for emergent awareness during most tasks   Clinical Impression/Discharge Summary:   Pt made functional gains and met 4 out of 5 long term goals this admission. Pt currently requires Supervision to Min assist for basic to mildly complex tasks and will require 24/7 supervision at discharge for his greatest safety. Pt has demonstrated improved emergent awareness, use of compensatory memory strategies, sustained and selective attention to tasks, sequencing, and basic to mildly complex problem solving. When dual tasking and/or during more complex tasks, pt requires increased cueing for problem solving, error awareness, and attention. He has mild dysarthria but is using  compensatory strategies to achieve 95-100% intelligibility in conversation with Supervision A verbal cues. Given cognitive deficits still present and impacting his daily functioning, recommend pt continue to receive skilled ST services upon discharge. Pt and family education is complete at this time.    Care Partner:  Caregiver Able to Provide Assistance: Yes  Type of Caregiver Assistance: Cognitive  Recommendation:  Home Health SLP;24 hour supervision/assistance      Equipment: none   Reasons for discharge: Discharged from hospital   Patient/Family Agrees with Progress Made and Goals Achieved: Yes    Arbutus Leas 10/28/2020, 8:27 AM

## 2020-10-29 NOTE — Progress Notes (Signed)
[facility] PHYSICAL MEDICINE & REHABILITATION PROGRESS NOTE   Subjective/Complaints:   No issues overnight , pt feels ok, discussed bladder meds and tone LUE  Mom and Dad at bedside   ROS: Patient denies CP, SOB, N/V/D    Objective:   No results found. No results for input(s): WBC, HGB, HCT, PLT in the last 72 hours. No results for input(s): NA, K, CL, CO2, GLUCOSE, BUN, CREATININE, CALCIUM in the last 72 hours.  Intake/Output Summary (Last 24 hours) at 10/29/2020 0902 Last data filed at 10/29/2020 0530 Gross per 24 hour  Intake --  Output 1475 ml  Net -1475 ml        Physical Exam: Vital Signs Blood pressure 110/69, pulse (!) 49, temperature 98.7 F (37.1 C), resp. rate 18, height 5\' 10"  (1.778 m), weight 84.4 kg, SpO2 94 %.    General: No acute distress Mood and affect are appropriate Heart: Regular rate and rhythm no rubs murmurs or extra sounds Lungs: Clear to auscultation, breathing unlabored, no rales or wheezes Abdomen: Positive bowel sounds, soft nontender to palpation, nondistended Extremities: No clubbing, cyanosis, or edema Skin: No evidence of breakdown, no evidence of rash  Motor: RUE/RLE: 5/5 proximal distal LUE: Shoulder abduction, elbow flexion/extension 4-/5, handgrip 4-/5 with flexor synergy Brunnstrom 4 MAS /3  In L wrist flexors , MAS 1 elbow flexors and FF Wrist ext 2-, finger ext 3/5 Left lower extremity: 4 -/5 proximal distal  Assessment/Plan: 1. Functional deficits related to L HP, R CVA  Stable for D/C today F/u PCP in 3-4 weeks F/u PM&R 2 weeks See D/C summary See D/C instructions Care Tool:  Bathing    Body parts bathed by patient: Face,Left arm,Chest,Abdomen,Front perineal area,Right upper leg,Left upper leg,Right lower leg,Left lower leg   Body parts bathed by helper: Right arm,Buttocks     Bathing assist Assist Level: Minimal Assistance - Patient > 75%     Upper Body Dressing/Undressing Upper body dressing   What is the  patient wearing?: Pull over shirt    Upper body assist Assist Level: Moderate Assistance - Patient 50 - 74%    Lower Body Dressing/Undressing Lower body dressing      What is the patient wearing?: Pants     Lower body assist Assist for lower body dressing: Moderate Assistance - Patient 50 - 74%     Toileting Toileting    Toileting assist Assist for toileting: Moderate Assistance - Patient 50 - 74%     Transfers Chair/bed transfer  Transfers assist     Chair/bed transfer assist level: Minimal Assistance - Patient > 75% Chair/bed transfer assistive device: Armrests   Locomotion Ambulation   Ambulation assist      Assist level: Moderate Assistance - Patient 50 - 74% Assistive device: No Device Max distance: 180ft   Walk 10 feet activity   Assist     Assist level: Minimal Assistance - Patient > 75% Assistive device: No Device   Walk 50 feet activity   Assist Walk 50 feet with 2 turns activity did not occur: Safety/medical concerns  Assist level: Minimal Assistance - Patient > 75% Assistive device: No Device    Walk 150 feet activity   Assist Walk 150 feet activity did not occur: Safety/medical concerns  Assist level: Moderate Assistance - Patient - 50 - 74% Assistive device: No Device    Walk 10 feet on uneven surface  activity   Assist Walk 10 feet on uneven surfaces activity did not occur: Safety/medical concerns  Assist level: Moderate Assistance - Patient - 50 - 74% Assistive device: Other (comment) (railing)   Wheelchair     Assist Will patient use wheelchair at discharge?: Yes Type of Wheelchair: Manual    Wheelchair assist level: Set up assist,Supervision/Verbal cueing Max wheelchair distance: 160ft    Wheelchair 50 feet with 2 turns activity    Assist        Assist Level: Supervision/Verbal cueing   Wheelchair 150 feet activity     Assist      Assist Level: Supervision/Verbal cueing   Blood pressure  110/69, pulse (!) 49, temperature 98.7 F (37.1 C), resp. rate 18, height 5\' 10"  (1.778 m), weight 84.4 kg, SpO2 94 %.    Medical Problem List and Plan: 1.  Left-sided hemiparesis, now with spasticity and facial droop secondary to acute infarct right basal ganglia/corona radiata on 09/26/2020 as well as history of astrocytoma with left frontal craniotomy resection 20 years ago  Stable for D/C  WHO nightly   Baclofen 5mg  TID. Spasticity L wrist flexors, cont stretch , HHOT, WHO, will reassess at OP visit  2.  Antithrombotics: -DVT/anticoagulation: SCDs             -antiplatelet therapy: Aspirin 81 mg daily and Plavix 75 mg daily x3 weeks then aspirin alone-Off  Plavix  3. Pain Management: Continue Tylenol as needed.  4. Mood: Continue Lexapro 10 mg daily.  Provide emotional support             -antipsychotic agents: N/A 5. Neuropsych: This patient is capable of making decisions on his own behalf. 6. Skin/Wound Care: Continue routine skin checks  Santyl to wound 7. Fluids/Electrolytes/Nutrition:  8.  Seizure disorder. Continue Lamictal 225 mg daily and 300 mg nightly  No seizure since rehab admission  9.  Hyperlipidemia.  LDL 131 on 11/13. Continue Lipitor 40mg  daily.  10. Hypothyroidism: Synthroid 11.  Leukocytosis: resolved last WBC 11/29 6.5K  Afebrile 12.  Bladder incontinence improved, still with occ urge incont, trial low dose ditropan increase to 5mg  TID, pt will cont this , reassess at OP visit                         13.  Bowel incont improved 14.  Sleep is poor cont melatonin increase to 6mg   Improved 15. Bradycardia:   12/14: HR in 50s-60s, continue to monitor TID LOS: 29 days A FACE TO [place] [facility] 10/29/2020, 9:02 AM

## 2020-10-29 NOTE — Discharge Instructions (Signed)
Inpatient Rehab Discharge Instructions  Coty Larsh Discharge date and time: No discharge date for patient encounter.   Activities/Precautions/ Functional Status: Activity: activity as tolerated Diet: regular diet Wound Care: Routine skin checks Functional status:  ___ No restrictions     ___ Walk up steps independently ___ 24/7 supervision/assistance   ___ Walk up steps with assistance ___ Intermittent supervision/assistance  ___ Bathe/dress independently ___ Walk with walker     _x__ Bathe/dress with assistance ___ Walk Independently    ___ Shower independently ___ Walk with assistance    ___ Shower with assistance ___ No alcohol     ___ Return to work/school ________  COMMUNITY REFERRALS UPON DISCHARGE:    Home Health:   PT     OT     ST                       Agency: [facility]  Phone: [phone]    Medical Equipment/Items Ordered: Oncologist , [facility] Bed, Bedside Commode, Tub Transfer Bench                                                 Agency/Supplier: [employer]  Special Instructions: Patient Home Health start of care set to begin weekend (Friday/Saturday) Company will schedule with patient mother.  No driving smoking or alcohol   STROKE/TIA DISCHARGE INSTRUCTIONS SMOKING Cigarette smoking nearly doubles your risk of having a stroke & is the single most alterable risk factor  If you smoke or have smoked in the last 12 months, you are advised to quit smoking for your health.  Most of the excess cardiovascular risk related to smoking disappears within a year of stopping.  Ask you doctor about anti-smoking medications  [place] Quit Line: 1-800-QUIT NOW  Free Smoking Cessation Classes [phone]  CHOLESTEROL Know your levels; limit fat & cholesterol in your diet  Lipid Panel     Component Value Date/Time   CHOL 199 09/27/2020 0501   TRIG 130 09/27/2020 0501   HDL 42 09/27/2020 0501   CHOLHDL 4.7 09/27/2020 0501   VLDL  26 09/27/2020 0501   LDLCALC 131 (H) 09/27/2020 0501      Many patients benefit from treatment even if their cholesterol is at goal.  Goal: Total Cholesterol (CHOL) less than 160  Goal:  Triglycerides (TRIG) less than 150  Goal:  HDL greater than 40  Goal:  LDL (LDLCALC) less than 100   BLOOD PRESSURE American Stroke Association blood pressure target is less that 120/80 mm/Hg  Your discharge blood pressure is:  BP: 130/89  Monitor your blood pressure  Limit your salt and alcohol intake  Many individuals will require more than one medication for high blood pressure  DIABETES (A1c is a blood sugar average for last 3 months) Goal HGBA1c is under 7% (HBGA1c is blood sugar average for last 3 months)  Diabetes: No known diagnosis of diabetes    Lab Results  Component Value Date   HGBA1C 5.4 09/27/2020     Your HGBA1c can be lowered with medications, healthy diet, and exercise.  Check your blood sugar as directed by your physician  Call your physician if you experience unexplained or low blood sugars.  PHYSICAL ACTIVITY/REHABILITATION Goal is 30 minutes at least 4 days per week  Activity: Increase activity slowly, Therapies:  Physical Therapy: Home Health Return to work:   Activity decreases your risk of heart attack and stroke and makes your heart stronger.  It helps control your weight and blood pressure; helps you relax and can improve your mood.  Participate in a regular exercise program.  Talk with your doctor about the best form of exercise for you (dancing, walking, swimming, cycling).  DIET/WEIGHT Goal is to maintain a healthy weight  Your discharge diet is:  Diet Order            Diet Heart Room service appropriate? Yes; Fluid consistency: Thin  Diet effective now                 liquids Your height is:  Height: 5\' 10"  (177.8 cm) Your current weight is: Weight: 84.4 kg Your Body Mass Index (BMI) is:  BMI (Calculated): 26.7  Following the type of diet  specifically designed for you will help prevent another stroke.  Your goal weight range is:    Your goal Body Mass Index (BMI) is 19-24.  Healthy food habits can help reduce 3 risk factors for stroke:  High cholesterol, hypertension, and excess weight.  RESOURCES Stroke/Support Group:  Call [phone]   STROKE EDUCATION PROVIDED/REVIEWED AND GIVEN TO PATIENT Stroke warning signs and symptoms How to activate emergency medical system (call 911). Medications prescribed at discharge. Need for follow-up after discharge. Personal risk factors for stroke. Pneumonia vaccine given:  Flu vaccine given:  My questions have been answered, the writing is legible, and I understand these instructions.  I will adhere to these goals & educational materials that have been provided to me after my discharge from the hospital.      My questions have been answered and I understand these instructions. I will adhere to these goals and the provided educational materials after my discharge from the hospital.  Patient/Caregiver Signature _______________________________ Date __________  Clinician Signature _______________________________________ Date __________  Please bring this form and your medication list with you to all your follow-up doctor's appointments.

## 2020-10-29 NOTE — Progress Notes (Signed)
Recreational Therapy Discharge Summary Patient Details  Name: Howard White MRN: [id number] Date of Birth: 06/01/1961 Today's Date: 10/29/2020  Long term goals set: 1  Long term goals met: 1  Comments on progress toward goals: Pt is discharging home with family to provide/coordinate 24 hour assistance.  TR sessions focused on leisure education, activity analysis/modifications and home safety.  Pt focused on exercise and drinking a gallon of water a day once home as he was very active previously.  Discussed kitchen/home set up modifications for greater independence and safety at home.  Pt stated understanding.  Pt is min assist w/c level for simple tasks and would benefit from set up assistance.    Reasons goals not met: n/a  Equipment acquired: n/a  Reasons for discharge: discharge from hospital  Patient/family agrees with progress made and goals achieved: Yes  , 10/29/2020, 3:54 PM     

## 2020-10-29 NOTE — Progress Notes (Signed)
Patient discharged home with family, all belongings sent with patient including wheelchair. No complications noted at this time.  Audie Clear, LPN

## 2020-10-31 ENCOUNTER — Telehealth: Payer: Self-pay | Admitting: *Deleted

## 2020-10-31 NOTE — Telephone Encounter (Signed)
Transitional Care call--I spoke with his mother [name]    1. Are you/is patient experiencing any problems since coming home? Are there any questions regarding any aspect of care? NO 2. Are there any questions regarding medications administration/dosing? Are meds being taken as prescribed? Patient should review meds with caller to confirm NO, his mother confirms they have gotten all medications and he is taking as prescribed 3. Have there been any falls? NO fall, he had a near miss in the bathroom but was not a fall 4. Has Home Health been to the house and/or have they contacted you? If not, have you tried to contact them? Can we help you contact them? They have not called as of yet, but she is expecting to hear from them [facility]) 5. Are bowels and bladder emptying properly? Are there any unexpected incontinence issues? If applicable, is patient following bowel/bladder programs? NO PROBLEM 6. Any fevers, problems with breathing, unexpected pain?NO PROBLEM 7. Are there any skin problems or new areas of breakdown?NO PROBLEM 8. Has the patient/family member arranged specialty MD follow up (ie cardiology/neurology/renal/surgical/etc)?  Can we help arrange? His appt has been changed to 11/27/20 at the request of Mrs Balandran, he has appt 12/08/20 with Frann Rider NP neuro, and 12/04/20 with Dr Delfina Redwood PCP 9. Does the patient need any other services or support that we can help arrange? NO 10. Are caregivers following through as expected in assisting the patient? YES 11. Has the patient quit smoking, drinking alcohol, or using drugs as recommended?N/A  Appointment 11/27/20 Thursday @3 :00 arrive by 2:40 for appt with Dr Letta Pate Address reviewed. [place]

## 2020-11-01 DIAGNOSIS — E785 Hyperlipidemia, unspecified: Secondary | ICD-10-CM | POA: Diagnosis not present

## 2020-11-01 DIAGNOSIS — I69354 Hemiplegia and hemiparesis following cerebral infarction affecting left non-dominant side: Secondary | ICD-10-CM | POA: Diagnosis not present

## 2020-11-01 DIAGNOSIS — I10 Essential (primary) hypertension: Secondary | ICD-10-CM | POA: Diagnosis not present

## 2020-11-01 DIAGNOSIS — E039 Hypothyroidism, unspecified: Secondary | ICD-10-CM | POA: Diagnosis not present

## 2020-11-01 DIAGNOSIS — Z7982 Long term (current) use of aspirin: Secondary | ICD-10-CM | POA: Diagnosis not present

## 2020-11-01 DIAGNOSIS — G40909 Epilepsy, unspecified, not intractable, without status epilepticus: Secondary | ICD-10-CM | POA: Diagnosis not present

## 2020-11-01 DIAGNOSIS — I69392 Facial weakness following cerebral infarction: Secondary | ICD-10-CM | POA: Diagnosis not present

## 2020-11-01 DIAGNOSIS — Z85841 Personal history of malignant neoplasm of brain: Secondary | ICD-10-CM | POA: Diagnosis not present

## 2020-11-03 ENCOUNTER — Emergency Department (HOSPITAL_COMMUNITY)
Admission: EM | Admit: 2020-11-03 | Discharge: 2020-11-04 | Disposition: A | Discharge status: Home / Self Care | Payer: PPO | Attending: Emergency Medicine | Admitting: Emergency Medicine

## 2020-11-03 ENCOUNTER — Other Ambulatory Visit: Payer: Self-pay

## 2020-11-03 ENCOUNTER — Encounter (HOSPITAL_COMMUNITY): Payer: Self-pay | Admitting: Emergency Medicine

## 2020-11-03 DIAGNOSIS — Z85841 Personal history of malignant neoplasm of brain: Secondary | ICD-10-CM | POA: Insufficient documentation

## 2020-11-03 DIAGNOSIS — M5459 Other low back pain: Secondary | ICD-10-CM | POA: Diagnosis not present

## 2020-11-03 DIAGNOSIS — M545 Low back pain, unspecified: Secondary | ICD-10-CM | POA: Diagnosis not present

## 2020-11-03 DIAGNOSIS — J9811 Atelectasis: Secondary | ICD-10-CM | POA: Diagnosis not present

## 2020-11-03 DIAGNOSIS — Z7982 Long term (current) use of aspirin: Secondary | ICD-10-CM | POA: Insufficient documentation

## 2020-11-03 DIAGNOSIS — Z79899 Other long term (current) drug therapy: Secondary | ICD-10-CM | POA: Insufficient documentation

## 2020-11-03 DIAGNOSIS — E039 Hypothyroidism, unspecified: Secondary | ICD-10-CM | POA: Diagnosis not present

## 2020-11-03 DIAGNOSIS — Z8673 Personal history of transient ischemic attack (TIA), and cerebral infarction without residual deficits: Secondary | ICD-10-CM | POA: Diagnosis not present

## 2020-11-03 DIAGNOSIS — M549 Dorsalgia, unspecified: Secondary | ICD-10-CM | POA: Diagnosis not present

## 2020-11-03 DIAGNOSIS — Z853 Personal history of malignant neoplasm of breast: Secondary | ICD-10-CM | POA: Insufficient documentation

## 2020-11-03 DIAGNOSIS — R52 Pain, unspecified: Secondary | ICD-10-CM | POA: Diagnosis not present

## 2020-11-03 NOTE — ED Triage Notes (Signed)
Pt arrived via EMS from home. Pt started having cramping in his left mid back about an hour ago. Pt rates his pain a "12 or 13" when he moves, or a 3 when he is still. Pt has hx of stroke with left sided deficits about a month ago, but has no new deficits today. Pt is laying prone, is not tolerating turning over onto his back.

## 2020-11-04 ENCOUNTER — Emergency Department (HOSPITAL_COMMUNITY): Payer: PPO

## 2020-11-04 DIAGNOSIS — J9811 Atelectasis: Secondary | ICD-10-CM | POA: Diagnosis not present

## 2020-11-04 LAB — CBC WITH DIFFERENTIAL/PLATELET
Abs Immature Granulocytes: 0.03 10*3/uL (ref 0.00–0.07)
Basophils Absolute: 0 10*3/uL (ref 0.0–0.1)
Basophils Relative: 0 %
Eosinophils Absolute: 0.2 10*3/uL (ref 0.0–0.5)
Eosinophils Relative: 2 %
HCT: 45.6 % (ref 39.0–52.0)
Hemoglobin: 15.4 g/dL (ref 13.0–17.0)
Immature Granulocytes: 0 %
Lymphocytes Relative: 13 %
Lymphs Abs: 1.4 10*3/uL (ref 0.7–4.0)
MCH: 31.8 pg (ref 26.0–34.0)
MCHC: 33.8 g/dL (ref 30.0–36.0)
MCV: 94.2 fL (ref 80.0–100.0)
Monocytes Absolute: 1 10*3/uL (ref 0.1–1.0)
Monocytes Relative: 9 %
Neutro Abs: 7.8 10*3/uL — ABNORMAL HIGH (ref 1.7–7.7)
Neutrophils Relative %: 76 %
Platelets: 266 10*3/uL (ref 150–400)
RBC: 4.84 MIL/uL (ref 4.22–5.81)
RDW: 12 % (ref 11.5–15.5)
WBC: 10.4 10*3/uL (ref 4.0–10.5)
nRBC: 0 % (ref 0.0–0.2)

## 2020-11-04 LAB — BASIC METABOLIC PANEL
Anion gap: 9 (ref 5–15)
BUN: 17 mg/dL (ref 6–20)
CO2: 26 mmol/L (ref 22–32)
Calcium: 9.5 mg/dL (ref 8.9–10.3)
Chloride: 104 mmol/L (ref 98–111)
Creatinine, Ser: 0.99 mg/dL (ref 0.61–1.24)
GFR, Estimated: >60 mL/min (ref >60)
Glucose, Bld: 120 mg/dL — ABNORMAL HIGH (ref 70–99)
Potassium: 4 mmol/L (ref 3.5–5.1)
Sodium: 139 mmol/L (ref 135–145)

## 2020-11-04 LAB — MAGNESIUM: Magnesium: 2 mg/dL (ref 1.7–2.4)

## 2020-11-04 MED ORDER — DIAZEPAM 2 MG PO TABS
2.0000 mg | ORAL_TABLET | Freq: Once | ORAL | Status: AC
  Administered 2020-11-04: 2 mg via ORAL
  Filled 2020-11-04: qty 1

## 2020-11-04 MED ORDER — BACLOFEN 10 MG PO TABS: 10.0000 mg | ORAL_TABLET | Freq: Three times a day (TID) | ORAL | Status: DC

## 2020-11-04 MED ORDER — DIAZEPAM 5 MG PO TABS: 2.5000 mg | ORAL_TABLET | Freq: Three times a day (TID) | ORAL | 0 refills | Status: DC | PRN

## 2020-11-04 MED ORDER — MORPHINE SULFATE (PF) 4 MG/ML IV SOLN
6.0000 mg | Freq: Once | INTRAVENOUS | Status: AC
  Administered 2020-11-04: 6 mg via INTRAMUSCULAR
  Filled 2020-11-04: qty 2

## 2020-11-04 MED ORDER — KETOROLAC TROMETHAMINE 60 MG/2ML IM SOLN
30.0000 mg | Freq: Once | INTRAMUSCULAR | Status: AC
  Administered 2020-11-04: 30 mg via INTRAMUSCULAR
  Filled 2020-11-04: qty 2

## 2020-11-04 MED ORDER — BACLOFEN 10 MG PO TABS
10.0000 mg | ORAL_TABLET | Freq: Three times a day (TID) | ORAL | Status: DC
  Administered 2020-11-04 (×2): 10 mg via ORAL
  Filled 2020-11-04 (×2): qty 1

## 2020-11-04 MED ORDER — OXYCODONE-ACETAMINOPHEN 5-325 MG PO TABS
2.0000 | ORAL_TABLET | Freq: Once | ORAL | Status: AC
  Administered 2020-11-04: 2 via ORAL
  Filled 2020-11-04: qty 2

## 2020-11-04 NOTE — ED Provider Notes (Signed)
[facility] DEPT Provider Note   CSN: [id number] Arrival date & time: 11/03/20  2234     History No chief complaint on file.   Howard White is a 59 y.o. male.  Recent stroke. On baclofen for muscle spasms. Having left sided back spasm unlike previous. No other symptoms. No urinary symptoms. No new neurologic changes.    Back Pain      Past Medical History:  Diagnosis Date  . Brain cancer ([place])   . Seizures ([place])   . Thyroid disease    Hypothyroid    Patient Active Problem List   Diagnosis Date Noted  . Slow transit constipation   . Sleep disturbance   . Basal ganglia stroke ([place])   . Incontinence of feces   . Leukocytosis   . Seizures ([place])   . Spastic hemiparesis ([place])   . Cerebrovascular accident (CVA) of right basal ganglia ([place]) 09/30/2020  . CVA (cerebral vascular accident) ([place]) 09/26/2020  . Gait disorder 10/12/2016  . Memory loss 10/12/2016  . Epilepsy ([place]) 03/30/2013  . Malignant neoplasm of brain ([place]) 03/30/2013  . Astrocytoma brain tumor ([place]) 03/27/2012    Past Surgical History:  Procedure Laterality Date  . brain cancer    . BRAIN SURGERY         Family History  Problem Relation Age of Onset  . Brain cancer Other   . Thyroid disease Other   . Cancer Other   . Diabetes Other   . Stroke Other     Social History   Tobacco Use  . Smoking status: Never Smoker  . Smokeless tobacco: Never Used  Substance Use Topics  . Alcohol use: Yes    Alcohol/week: 0.0 standard drinks    Comment: 2 beers a month  . Drug use: No    Home Medications Prior to Admission medications   Medication Sig Start Date End Date Taking? Authorizing Provider  acetaminophen (TYLENOL) 325 MG tablet Take 2 tablets (650 mg total) by mouth every 6 (six) hours as needed for mild pain (or Fever >/= 101). 10/28/20  Yes Angiulli, Lavon Paganini, PA-C  aspirin EC 81 MG EC tablet Take 1 tablet (81 mg total) by mouth daily. Swallow whole.  10/01/20  Yes Pahwani, Einar Grad, MD  atorvastatin (LIPITOR) 40 MG tablet Take 1 tablet (40 mg total) by mouth daily. 10/28/20 11/27/20 Yes Angiulli, Lavon Paganini, PA-C  Baclofen 5 MG TABS Take 5 mg by mouth 3 (three) times daily. 10/28/20  Yes Angiulli, Lavon Paganini, PA-C  docusate sodium (COLACE) 100 MG capsule Take 2 capsules (200 mg total) by mouth 2 (two) times daily. 10/28/20  Yes Angiulli, Lavon Paganini, PA-C  escitalopram (LEXAPRO) 10 MG tablet Take 1 tablet (10 mg total) by mouth daily. 10/28/20  Yes Angiulli, Lavon Paganini, PA-C  famotidine (PEPCID) 20 MG tablet Take 1 tablet (20 mg total) by mouth daily. 10/28/20  Yes Angiulli, Lavon Paganini, PA-C  ketotifen (ZADITOR) 0.025 % ophthalmic solution Place 1 drop into both eyes 2 (two) times daily as needed (for itchy eyes). 10/28/20  Yes Angiulli, Lavon Paganini, PA-C  lamoTRIgine (LAMICTAL) 150 MG tablet TAKE 1 AND 1/2 TABS EVERY MORNING AND 2 TAB EVERY EVENING Patient taking differently: Take 225-300 mg by mouth See admin instructions. TAKE 1 AND 1/2 TABS ( 225mg )  EVERY MORNING AND 2 TAB (300mg ) EVERY EVENING 05/06/20  Yes Suzzanne Cloud, NP  melatonin 3 MG TABS tablet Take 2 tablets (6 mg total) by mouth at bedtime. 10/28/20  Yes Angiulli, Lavon Paganini, PA-C  Multiple Vitamins-Minerals (MULTIVITAMIN WITH MINERALS) tablet Take 1 tablet by mouth daily.   Yes [provider]  oxybutynin (DITROPAN) 5 MG tablet Take 1 tablet (5 mg total) by mouth 3 (three) times daily. 10/28/20  Yes Angiulli, Lavon Paganini, PA-C  polyethylene glycol (MIRALAX / GLYCOLAX) 17 g packet Take 17 g by mouth daily. 10/28/20  Yes Angiulli, Lavon Paganini, PA-C  SYNTHROID 112 MCG tablet Take 1 tablet (112 mcg total) by mouth at bedtime. 10/28/20  Yes Angiulli, Lavon Paganini, PA-C  calcium carbonate (TUMS - DOSED IN MG ELEMENTAL CALCIUM) 500 MG chewable tablet Chew 1 tablet (200 mg of elemental calcium total) by mouth daily. 10/28/20   Angiulli, Lavon Paganini, PA-C  diazepam (VALIUM) 5 MG tablet Take 0.5 tablets (2.5 mg  total) by mouth every 8 (eight) hours as needed for muscle spasms (spasms). 11/04/20   Onis Markoff, Corene Cornea, MD    Allergies    Lactose intolerance (gi)  Review of Systems   Review of Systems  Musculoskeletal: Positive for back pain.    Physical Exam Updated Vital Signs BP (!) 106/41 (BP Location: Right Arm)   Pulse 69   Temp 97.7 F (36.5 C) (Oral)   Resp 15   Ht 5\' 10"  (1.778 m)   Wt 81.6 kg   SpO2 95%   BMI 25.83 kg/m   Physical Exam  ED Results / Procedures / Treatments   Labs (all labs ordered are listed, but only abnormal results are displayed) Labs Reviewed  CBC WITH DIFFERENTIAL/PLATELET - Abnormal; Notable for the following components:      Result Value   Neutro Abs 7.8 (*)    All other components within normal limits  BASIC METABOLIC PANEL - Abnormal; Notable for the following components:   Glucose, Bld 120 (*)    All other components within normal limits  MAGNESIUM    EKG None  Radiology DG Chest Portable 1 View  Result Date: 11/04/2020 CLINICAL DATA:  Cramping in the left mid back about 1 hour prior, worse with movement EXAM: PORTABLE CHEST 1 VIEW COMPARISON:  None. FINDINGS: Low lung volumes with some streaky opacities in the lungs favoring atelectasis given patient's discomfort with motion. No consolidative opacity. No pneumothorax or visible effusion. Cardiomediastinal contours are unremarkable for portable supine technique. No acute chest wall abnormality is radiographically evident. IMPRESSION: Low lung volumes with some streaky opacities in the lungs favoring atelectasis given patient's discomfort with motion. Electronically Signed   By: Lovena Le M.D.   On: 11/04/2020 00:34    Procedures Procedures (including critical care time)  Medications Ordered in ED Medications  oxyCODONE-acetaminophen (PERCOCET/ROXICET) 5-325 MG per tablet 2 tablet (2 tablets Oral Given 11/04/20 0211)  ketorolac (TORADOL) injection 30 mg (30 mg Intramuscular Given 11/04/20  0620)  morphine 4 MG/ML injection 6 mg (6 mg Intramuscular Given 11/04/20 0624)  diazepam (VALIUM) tablet 2 mg (2 mg Oral Given 11/04/20 7408)    ED Course  I have reviewed the triage vital signs and the nursing notes.  Pertinent labs & imaging results that were available during my care of the patient were reviewed by me and considered in my medical decision making (see chart for details).    MDM Rules/Calculators/A&P                          After multiple medications above, symptoms improved. Stable for discharge.   Final Clinical Impression(s) / ED Diagnoses Final diagnoses:  Acute left-sided low back pain without sciatica    Rx / DC Orders ED Discharge Orders         Ordered    diazepam (VALIUM) 5 MG tablet  Every 8 hours PRN        11/04/20 1314           Merrily Pew, MD 11/04/20 2305

## 2020-11-04 NOTE — ED Notes (Signed)
Pt assisted to move up in bed

## 2020-11-05 ENCOUNTER — Encounter: Payer: PPO | Admitting: Registered Nurse

## 2020-11-15 DIAGNOSIS — I69354 Hemiplegia and hemiparesis following cerebral infarction affecting left non-dominant side: Secondary | ICD-10-CM | POA: Diagnosis not present

## 2020-11-15 DIAGNOSIS — Z85841 Personal history of malignant neoplasm of brain: Secondary | ICD-10-CM | POA: Diagnosis not present

## 2020-11-15 DIAGNOSIS — I10 Essential (primary) hypertension: Secondary | ICD-10-CM | POA: Diagnosis not present

## 2020-11-15 DIAGNOSIS — E039 Hypothyroidism, unspecified: Secondary | ICD-10-CM | POA: Diagnosis not present

## 2020-11-15 DIAGNOSIS — E785 Hyperlipidemia, unspecified: Secondary | ICD-10-CM | POA: Diagnosis not present

## 2020-11-15 DIAGNOSIS — G40909 Epilepsy, unspecified, not intractable, without status epilepticus: Secondary | ICD-10-CM | POA: Diagnosis not present

## 2020-11-15 DIAGNOSIS — I69392 Facial weakness following cerebral infarction: Secondary | ICD-10-CM | POA: Diagnosis not present

## 2020-11-15 DIAGNOSIS — Z7982 Long term (current) use of aspirin: Secondary | ICD-10-CM | POA: Diagnosis not present

## 2020-11-19 DIAGNOSIS — I10 Essential (primary) hypertension: Secondary | ICD-10-CM | POA: Diagnosis not present

## 2020-11-19 DIAGNOSIS — Z85841 Personal history of malignant neoplasm of brain: Secondary | ICD-10-CM | POA: Diagnosis not present

## 2020-11-19 DIAGNOSIS — I69392 Facial weakness following cerebral infarction: Secondary | ICD-10-CM | POA: Diagnosis not present

## 2020-11-19 DIAGNOSIS — E039 Hypothyroidism, unspecified: Secondary | ICD-10-CM | POA: Diagnosis not present

## 2020-11-19 DIAGNOSIS — E785 Hyperlipidemia, unspecified: Secondary | ICD-10-CM | POA: Diagnosis not present

## 2020-11-19 DIAGNOSIS — G40909 Epilepsy, unspecified, not intractable, without status epilepticus: Secondary | ICD-10-CM | POA: Diagnosis not present

## 2020-11-19 DIAGNOSIS — Z7982 Long term (current) use of aspirin: Secondary | ICD-10-CM | POA: Diagnosis not present

## 2020-11-19 DIAGNOSIS — I69354 Hemiplegia and hemiparesis following cerebral infarction affecting left non-dominant side: Secondary | ICD-10-CM | POA: Diagnosis not present

## 2020-11-20 DIAGNOSIS — Z7982 Long term (current) use of aspirin: Secondary | ICD-10-CM | POA: Diagnosis not present

## 2020-11-20 DIAGNOSIS — E785 Hyperlipidemia, unspecified: Secondary | ICD-10-CM | POA: Diagnosis not present

## 2020-11-20 DIAGNOSIS — G40909 Epilepsy, unspecified, not intractable, without status epilepticus: Secondary | ICD-10-CM | POA: Diagnosis not present

## 2020-11-20 DIAGNOSIS — I69354 Hemiplegia and hemiparesis following cerebral infarction affecting left non-dominant side: Secondary | ICD-10-CM | POA: Diagnosis not present

## 2020-11-20 DIAGNOSIS — I10 Essential (primary) hypertension: Secondary | ICD-10-CM | POA: Diagnosis not present

## 2020-11-20 DIAGNOSIS — Z85841 Personal history of malignant neoplasm of brain: Secondary | ICD-10-CM | POA: Diagnosis not present

## 2020-11-20 DIAGNOSIS — I69392 Facial weakness following cerebral infarction: Secondary | ICD-10-CM | POA: Diagnosis not present

## 2020-11-20 DIAGNOSIS — E039 Hypothyroidism, unspecified: Secondary | ICD-10-CM | POA: Diagnosis not present

## 2020-11-25 DIAGNOSIS — E785 Hyperlipidemia, unspecified: Secondary | ICD-10-CM | POA: Diagnosis not present

## 2020-11-25 DIAGNOSIS — G40909 Epilepsy, unspecified, not intractable, without status epilepticus: Secondary | ICD-10-CM | POA: Diagnosis not present

## 2020-11-25 DIAGNOSIS — I69392 Facial weakness following cerebral infarction: Secondary | ICD-10-CM | POA: Diagnosis not present

## 2020-11-25 DIAGNOSIS — I10 Essential (primary) hypertension: Secondary | ICD-10-CM | POA: Diagnosis not present

## 2020-11-25 DIAGNOSIS — E039 Hypothyroidism, unspecified: Secondary | ICD-10-CM | POA: Diagnosis not present

## 2020-11-25 DIAGNOSIS — Z85841 Personal history of malignant neoplasm of brain: Secondary | ICD-10-CM | POA: Diagnosis not present

## 2020-11-25 DIAGNOSIS — Z7982 Long term (current) use of aspirin: Secondary | ICD-10-CM | POA: Diagnosis not present

## 2020-11-25 DIAGNOSIS — I69354 Hemiplegia and hemiparesis following cerebral infarction affecting left non-dominant side: Secondary | ICD-10-CM | POA: Diagnosis not present

## 2020-11-27 ENCOUNTER — Encounter: Payer: PPO | Attending: Registered Nurse | Admitting: Physical Medicine & Rehabilitation

## 2020-11-27 ENCOUNTER — Other Ambulatory Visit: Payer: Self-pay

## 2020-11-27 ENCOUNTER — Encounter: Payer: Self-pay | Admitting: Physical Medicine & Rehabilitation

## 2020-11-27 VITALS — BP 113/77 | HR 74 | Temp 97.9°F | Ht 70.0 in | Wt 186.0 lb

## 2020-11-27 DIAGNOSIS — I6381 Other cerebral infarction due to occlusion or stenosis of small artery: Secondary | ICD-10-CM | POA: Diagnosis not present

## 2020-11-27 DIAGNOSIS — G8110 Spastic hemiplegia affecting unspecified side: Secondary | ICD-10-CM | POA: Diagnosis not present

## 2020-11-27 MED ORDER — BACLOFEN 5 MG PO TABS: 5.0000 mg | ORAL_TABLET | Freq: Three times a day (TID) | ORAL | 0 refills | Status: DC

## 2020-11-27 NOTE — Progress Notes (Signed)
Subjective:    Patient ID: Tyshan Enderle, male    DOB: 1961/07/29, 59 y.o.   MRN: [id number] Admit date: 09/30/2020 Discharge date: 10/29/2020 60 y.o. 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  Per chart review lives with elderly parents.  Independent prior to admission.  Presented 09/26/2020 left-sided weakness and facial droop.  Cranial CT scan showed no acute intracranial abnormality or significant interval change.  Left frontal craniotomy with resection cavity noted.  CT angiogram of head and neck without large vessel occlusion or limiting proximal stenosis.  MRI of the brain showed a 1.9 x 1.2 cm acute infarct extending from the right corona radiata into the right basal ganglia.  Echocardiogram with ejection fraction of 60 to 65% no wall motion abnormalities.  Admission chemistries unremarkable except potassium 5.5.  Maintained on aspirin and Plavix for CVA prophylaxis x3 weeks then aspirin alone.  Tolerating a regular diet.  Therapy evaluations completed and patient was admitted for a comprehensive rehab program  He/ has had improvement in functional use RUE/LUE  and RLE/LLE as well as improvement in awareness.  Supine sitting right edge of bed supervision for safety.  Threaded lower extremity to pants and don shoes max assist.  Ambulates in and out of bathroom 25 feet no assistive device minimal assist from therapist.  Total assist lower body clothing management.  He currently needs max assist for ADLs secondary to left hemiparesis and decreased ability to orient clothing and complete hemidressing technique.  He is able to complete stand pivot transfers to the wheelchair 3 and 1 and shower bench with overall minimal assist   HPI  60 year old male with prior history of astrocytoma left brain with right basal ganglia CVA on 09/26/2020.   He has completed the inpatient stroke rehabilitation program at [facility] and was discharged home on 10/29/2020.  He is living with his parents who assist him with ADLs.  He is currently receiving home health therapy services with PT and OT.  Interval history the patient had an ED visit on 11/04/2020 for left-sided rib pain.  Had no falls or trauma.  He felt like it was a muscle spasm.  He states that he has had these problems in the past prior to his stroke.  Seeing PCP next week- Dr. Iona Beard Osei-Bonsu  ADL- Still needs help with UE and LE dressing,  Bathing with tub transfer bench uses wand  Mobility- now using toilet rather than BSC, no other amb except with PT Spastic bladder- No urinary   Spasticity - discussed baclofen, physical and occupational therapy as well as botulinum toxin Pain Inventory Average Pain 5 Pain Right Now 5 My pain is aching  LOCATION OF PAIN neck, back, hip, rib  BOWEL Number of stools per week: 3 Oral laxative use Yes  Type of laxative miralax, colace Enema or suppository use No  History of colostomy No  Incontinent No   BLADDER Pads In and out cath, frequency na Able to self cath Yes  Bladder incontinence Yes  Frequent urination No  Leakage with coughing No  Difficulty starting stream No  Incomplete bladder emptying Yes    Mobility walk with assistance ability to climb steps?  no do you drive?  no use a wheelchair needs help with transfers  Function disabled: date disabled 2001 retired I need assistance with the following:  dressing, bathing, toileting,  meal prep, household duties and shopping  Neuro/Psych bladder control problems weakness trouble walking spasms depression anxiety  Prior Studies TC appt  Physicians involved in your care TC appt   Family History  Problem Relation Age of Onset  . Brain cancer Other   . Thyroid disease Other   . Cancer Other   . Diabetes Other   . Stroke Other    Social History    Socioeconomic History  . Marital status: Divorced    Spouse name: Not on file  . Number of children: 0  . Years of education: Masters  . Highest education level: Not on file  Occupational History  . Occupation: Disability  Tobacco Use  . Smoking status: Never Smoker  . Smokeless tobacco: Never Used  Substance and Sexual Activity  . Alcohol use: Yes    Alcohol/week: 0.0 standard drinks    Comment: 2 beers a month  . Drug use: No  . Sexual activity: Not on file  Other Topics Concern  . Not on file  Social History Narrative   Patient lives at home with his parents.   Patient is on long term disability.    Patient is divorced.    Patient has no children.    Patient has his Master's in Business.    Patient is right-handed.   Social Determinants of Health   Financial Resource Strain: Not on file  Food Insecurity: Not on file  Transportation Needs: Not on file  Physical Activity: Not on file  Stress: Not on file  Social Connections: Not on file   Past Surgical History:  Procedure Laterality Date  . brain cancer    . BRAIN SURGERY     Past Medical History:  Diagnosis Date  . Brain cancer ([place])   . Seizures ([place])   . Thyroid disease    Hypothyroid   BP 113/77   Pulse 74   Temp 97.9 F (36.6 C)   Ht 5\' 10"  (1.778 m)   Wt 186 lb (84.4 kg)   SpO2 96%   BMI 26.69 kg/m   Opioid Risk Score:   Fall Risk Score:  `1  Depression screen PHQ 2/9  Depression screen PHQ 2/9 11/27/2020  Decreased Interest 2  Down, Depressed, Hopeless 0  PHQ - 2 Score 2  Altered sleeping 1  Tired, decreased energy 1  Change in appetite 0  Feeling bad or failure about yourself  0  Trouble concentrating 0  Moving slowly or fidgety/restless 0  Suicidal thoughts 0  PHQ-9 Score 4  Difficult doing work/chores Not difficult at all     Review of Systems  Musculoskeletal: Positive for back pain and neck pain.       Rib pain  All other systems reviewed and are negative.       Objective:   Physical Exam Vitals and nursing note reviewed.  Constitutional:      Appearance: He is normal weight.  HENT:     Head: Normocephalic and atraumatic.  Eyes:     Extraocular Movements: Extraocular movements intact.     Conjunctiva/sclera: Conjunctivae normal.     Pupils: Pupils are equal, round, and reactive to light.  Musculoskeletal:        General: No tenderness.     Comments: No tenderness along the thoracic or lumbar spine.  No pain with lumbar or thoracic flexion or extension or lateral bending.  Negative costovertebral angle tenderness Negative straight leg raising test  Skin:    General: Skin is warm  and dry.  Neurological:     General: No focal deficit present.     Mental Status: He is alert and oriented to person, place, and time.     Cranial Nerves: Cranial nerves are intact. No dysarthria or facial asymmetry.     Motor: Abnormal muscle tone present.     Coordination: Coordination abnormal. Impaired rapid alternating movements.     Comments: Motor strength is 5/5 right deltoid, bicep, tricep, grip, hip flexor, knee extensor, 3 - left deltoid, bicep, tricep 4 - at the finger flexors and finger extensors 4 at the left hip flexor knee extensor and ankle dorsiflexor. Ambulates without assistive device no evidence of toe drag or knee instability  Tone MAS 2 at the wrist flexor MAS 2 at the left thumb flexor MAS 1 at the finger flexors flexor There is no evidence of clonus at the left ankle.   Psychiatric:        Mood and Affect: Mood normal.        Behavior: Behavior normal.           Assessment & Plan:  1.  Right basal ganglia infarct with left spastic hemiparesis.  Overall making good progress.  Tone has improved in the left upper extremity compared to 1 month ago.  Motor strength has improved slightly as well compared to inpatient hospitalization.  Patient does have fatigue which we discussed was common poststroke and may not be medication related.   Continue home health PT OT. The patient will follow-up with vascular neurology Continue baclofen 5 mg 3 times daily 2.  Spastic bladder he feels like he is doing better with this.  He is not having any urge incontinence.  We will discontinue oxybutynin.  He has not taken this for a few days. 3.  History of seizure disorder follow-up with neurology, Dr. Krista Blue

## 2020-11-27 NOTE — Patient Instructions (Signed)
Call to let me know when Home health may end

## 2020-11-29 DIAGNOSIS — I6381 Other cerebral infarction due to occlusion or stenosis of small artery: Secondary | ICD-10-CM | POA: Diagnosis not present

## 2020-11-29 DIAGNOSIS — R159 Full incontinence of feces: Secondary | ICD-10-CM | POA: Diagnosis not present

## 2020-12-04 DIAGNOSIS — I6381 Other cerebral infarction due to occlusion or stenosis of small artery: Secondary | ICD-10-CM | POA: Diagnosis not present

## 2020-12-04 DIAGNOSIS — F322 Major depressive disorder, single episode, severe without psychotic features: Secondary | ICD-10-CM | POA: Diagnosis not present

## 2020-12-04 DIAGNOSIS — C719 Malignant neoplasm of brain, unspecified: Secondary | ICD-10-CM | POA: Diagnosis not present

## 2020-12-04 DIAGNOSIS — E039 Hypothyroidism, unspecified: Secondary | ICD-10-CM | POA: Diagnosis not present

## 2020-12-05 ENCOUNTER — Telehealth: Payer: Self-pay | Admitting: *Deleted

## 2020-12-05 DIAGNOSIS — G8110 Spastic hemiplegia affecting unspecified side: Secondary | ICD-10-CM

## 2020-12-05 DIAGNOSIS — I6381 Other cerebral infarction due to occlusion or stenosis of small artery: Secondary | ICD-10-CM

## 2020-12-05 NOTE — Telephone Encounter (Signed)
Patients wife reports that in house therapy is ending mid February. Sh says she was told to call and report.  She is asking what the next step will be leading up to discharge and what will happen thereafter

## 2020-12-05 NOTE — Telephone Encounter (Signed)
Please send referral to outpatient neuro rehab PT and OT evaluations Notify patient's mother that neuro rehab will be contacting her for the appointments

## 2020-12-05 NOTE — Addendum Note (Signed)
Addended by: Caro Hight on: 12/05/2020 04:06 PM   Modules accepted: Orders

## 2020-12-05 NOTE — Telephone Encounter (Signed)
Referrals sent and pts mother notified.

## 2020-12-08 ENCOUNTER — Encounter: Payer: Self-pay | Admitting: Adult Health

## 2020-12-08 ENCOUNTER — Other Ambulatory Visit: Payer: Self-pay

## 2020-12-08 ENCOUNTER — Ambulatory Visit: Payer: PPO | Admitting: Adult Health

## 2020-12-08 VITALS — BP 130/78 | HR 71 | Ht 70.0 in | Wt 186.0 lb

## 2020-12-08 DIAGNOSIS — G40209 Localization-related (focal) (partial) symptomatic epilepsy and epileptic syndromes with complex partial seizures, not intractable, without status epilepticus: Secondary | ICD-10-CM | POA: Diagnosis not present

## 2020-12-08 DIAGNOSIS — E785 Hyperlipidemia, unspecified: Secondary | ICD-10-CM | POA: Diagnosis not present

## 2020-12-08 DIAGNOSIS — I639 Cerebral infarction, unspecified: Secondary | ICD-10-CM

## 2020-12-08 NOTE — Progress Notes (Signed)
[facility] [address] [place]. [place] [address] [phone]       HOSPITAL FOLLOW UP NOTE  Mr. Elrico Gerrick Date of Birth:  03-24-1961 Medical Record Number:  [id number]   Reason for Referral:  hospital stroke follow up    SUBJECTIVE:   CHIEF COMPLAINT:  Chief Complaint  Patient presents with  . Follow-up    RM 58 with mother (pat) Pt is well, no more strokes but having more pain maybe muscle related per mom    HPI:   Mr. Alyn Loken is a 60 y.o. male with history of astrocytoma with left frontal craniotomy resection 20 years ago, seizures, depression and hypothyroidism,  who presented to [facility] ED on 09/26/2020 with right arm and leg weakness.   Personally reviewed hospitalization pertinent progress notes, lab work and imaging with summary provided.  Evaluated by Dr. Erlinda Hong with stroke work-up revealing acute infarct right BG/CR likely due to small vessel disease.  Recommended DAPT for 3 weeks and aspirin alone.  LDL 131 and initiate atorvastatin 40 mg daily.  No history or evidence of HTN or DM.  Other stroke risk factors include EtOH use but no prior stroke history.  Other active problems include history of seizures on Lamictal and astrocytoma s/p craniotomy with radiation 20 years ago.  Residual left hemiparesis and was discharged to CIR for ongoing therapy needs per therapy recommendations.  Stroke: acute infarct right BG/CR likely due to small vessel disease.   CT Head - No acute intracranial abnormality or significant interval change. Stable atrophy and white matter disease. Left frontal craniotomy with resection cavity.   MRI head - 1.9 x 1.2 cm acute infarct extending from the right corona radiata into the right basal ganglia. Prior left frontoparietal craniotomy with left frontal lobe resection cavity.   CTA H&N - Intracranial atherosclerosis without large vessel occlusion or flow limiting proximal stenosis. .   CT Perfusion - No evidence of acute  ischemia on CTP.  2D Echo EF 60-65%  [employer] Virus 2  - negative  LDL - 131  HgbA1c - 5.4  VTE prophylaxis - Lovenox  No antithrombotic prior to admission, now on aspirin 81 mg daily and clopidogrel 75 mg daily. Continue DAPT for 3 weeks, and then ASA alone.  Patient counseled to be compliant with his antithrombotic medications  Ongoing aggressive stroke risk factor management  Therapy recommendations:  CIR recommended   Disposition:  CIR   Today, 12/08/2020, Mr. Spearin is being seen for hospital follow-up accompanied by his mother.  He has been doing well since discharge.  Reports residual left-sided weakness and imbalance but has been gradually improving currently working with [facility] therapies.  Chronic history of lower back pain with left-sided sciatica worsened post stroke.  He has been using baclofen 5 mg 3 times daily with benefit although does admit to increased drowsiness after taking daytime dosage.  He will also use heating pad with benefit.  Denies new stroke/TIA symptoms.  Completed 3 weeks DAPT and remains on aspirin alone as well as atorvastatin 40 mg daily for secondary stroke prevention.  Blood pressure today 130/78.  No further concerns at this time.     ROS:   14 system review of systems performed and negative with exception of those listed in HPI  PMH:  Past Medical History:  Diagnosis Date  . Brain cancer ([place])   . Seizures ([place])   . Thyroid disease    Hypothyroid    PSH:  Past Surgical History:  Procedure Laterality Date  . brain cancer    . BRAIN SURGERY      Social History:  Social History   Socioeconomic History  . Marital status: Divorced    Spouse name: Not on file  . Number of children: 0  . Years of education: Masters  . Highest education level: Not on file  Occupational History  . Occupation: Disability  Tobacco Use  . Smoking status: Never Smoker  . Smokeless tobacco: Never Used  Substance and Sexual Activity  . Alcohol use:  Yes    Alcohol/week: 0.0 standard drinks    Comment: 2 beers a month  . Drug use: No  . Sexual activity: Not on file  Other Topics Concern  . Not on file  Social History Narrative   Patient lives at home with his parents.   Patient is on long term disability.    Patient is divorced.    Patient has no children.    Patient has his Master's in Business.    Patient is right-handed.   Social Determinants of Health   Financial Resource Strain: Not on file  Food Insecurity: Not on file  Transportation Needs: Not on file  Physical Activity: Not on file  Stress: Not on file  Social Connections: Not on file  Intimate Partner Violence: Not on file    Family History:  Family History  Problem Relation Age of Onset  . Brain cancer Other   . Thyroid disease Other   . Cancer Other   . Diabetes Other   . Stroke Other     Medications:   Current Outpatient Medications on File Prior to Visit  Medication Sig Dispense Refill  . acetaminophen (TYLENOL) 325 MG tablet Take 2 tablets (650 mg total) by mouth every 6 (six) hours as needed for mild pain (or Fever >/= 101).    Marland Kitchen aspirin EC 81 MG EC tablet Take 1 tablet (81 mg total) by mouth daily. Swallow whole. 30 tablet 11  . Baclofen 5 MG TABS Take 5 mg by mouth 3 (three) times daily. 90 tablet 0  . calcium carbonate (TUMS - DOSED IN MG ELEMENTAL CALCIUM) 500 MG chewable tablet Chew 1 tablet (200 mg of elemental calcium total) by mouth daily. 30 tablet 0  . docusate sodium (COLACE) 100 MG capsule Take 2 capsules (200 mg total) by mouth 2 (two) times daily. 10 capsule 0  . escitalopram (LEXAPRO) 10 MG tablet Take 1 tablet (10 mg total) by mouth daily. 30 tablet 0  . famotidine (PEPCID) 20 MG tablet Take 1 tablet (20 mg total) by mouth daily. 30 tablet 0  . ketotifen (ZADITOR) 0.025 % ophthalmic solution Place 1 drop into both eyes 2 (two) times daily as needed (for itchy eyes). 5 mL 0  . lamoTRIgine (LAMICTAL) 150 MG tablet TAKE 1 AND 1/2 TABS  EVERY MORNING AND 2 TAB EVERY EVENING (Patient taking differently: Take 225-300 mg by mouth See admin instructions. TAKE 1 AND 1/2 TABS ( 225mg )  EVERY MORNING AND 2 TAB (300mg ) EVERY EVENING) 315 tablet 3  . melatonin 3 MG TABS tablet Take 2 tablets (6 mg total) by mouth at bedtime. 30 tablet 0  . Multiple Vitamins-Minerals (MULTIVITAMIN WITH MINERALS) tablet Take 1 tablet by mouth daily.    . polyethylene glycol (MIRALAX / GLYCOLAX) 17 g packet Take 17 g by mouth daily. 14 each 0  . SYNTHROID 112 MCG tablet Take 1 tablet (112 mcg total) by mouth at bedtime. 30 tablet 0  .  atorvastatin (LIPITOR) 40 MG tablet Take 1 tablet (40 mg total) by mouth daily. 30 tablet 0   No current facility-administered medications on file prior to visit.    Allergies:   Allergies  Allergen Reactions  . Lactose Intolerance (Gi)       OBJECTIVE:  Physical Exam  Vitals:   12/08/20 1403  BP: 130/78  Pulse: 71  Weight: 186 lb (84.4 kg)  Height: 5\' 10"  (1.778 m)   Body mass index is 26.69 kg/m. No exam data present  Post stroke PHQ 2/9 Depression screen PHQ 2/9 11/27/2020  Decreased Interest 2  Down, Depressed, Hopeless 0  PHQ - 2 Score 2  Altered sleeping 1  Tired, decreased energy 1  Change in appetite 0  Feeling bad or failure about yourself  0  Trouble concentrating 0  Moving slowly or fidgety/restless 0  Suicidal thoughts 0  PHQ-9 Score 4  Difficult doing work/chores Not difficult at all     General: well developed, well nourished,  very pleasant middle-aged Caucasian male, seated, in no evident distress Head: head normocephalic and atraumatic.   Neck: supple with no carotid or supraclavicular bruits Cardiovascular: regular rate and rhythm, no murmurs Musculoskeletal: no deformity Skin:  no rash/petichiae Vascular:  Normal pulses all extremities   Neurologic Exam Mental Status: Awake and fully alert.   Fluent speech and language.  Oriented to place and time. Recent and remote  memory intact. Attention span, concentration and fund of knowledge appropriate. Mood and affect appropriate.  Cranial Nerves: Fundoscopic exam reveals sharp disc margins. Pupils equal, briskly reactive to light. Extraocular movements full without nystagmus. Visual fields full to confrontation. Hearing intact. Facial sensation intact. Face, tongue, palate moves normally and symmetrically.  Motor: Normal bulk and tone. Normal strength in right upper and lower extremity.  LUE 4/5 bicep and grip strength.  LLE 4/5 hip flexor and ankle dorsiflexion.  Increased tone left upper and lower extremity Sensory.: intact to touch , pinprick , position and vibratory sensation.  Coordination: Rapid alternating movements normal in all extremities except decreased left hand. Finger-to-nose and heel-to-shin performed accurately on right side. Gait and Station: Deferred -only ambulates short distance with PT Reflexes: 1+ and symmetric. Toes downgoing.     NIHSS  0 Modified Rankin  3      ASSESSMENT: Cas Mergel is a 60 y.o. year old male with acute right BG/CR infarct likely secondary to small vessel disease on 09/26/2020 found after presenting with right arm and leg weakness.  Vascular risk factors include HLD, astrocytoma s/p craniotomy 20 years ago and seizures on Lamictal.      PLAN:  1. R BG/CR stroke :  a. Residual deficit: Left spastic hemiparesis and gait impairment with imbalance.  Encouraged continued participation with home health therapy with plans on transitioning to neuro rehab outpatient therapy once completed.  Encouraged continued use of baclofen for poststroke spasticity and muscle spasms b. Continue aspirin 81 mg daily  and atorvastatin 40 mg daily for secondary stroke prevention.   c. Discussed secondary stroke prevention measures and importance of close PCP follow up for aggressive stroke risk factor management  2. HLD: LDL goal <70. Recent LDL 131 therefore initiated atorvastatin 40 mg  daily.  3. Seizures: on Lamictal per Dr. Krista Blue    Follow up in 3 months or call earlier if needed   CC:  [place] provider: Dr. Sedonia Small, MD    I spent 45 minutes of face-to-face and non-face-to-face time with patient and mother.  This included previsit chart review including recent hospitalization pertinent progress notes, lab work and imaging, lab review, study review, order entry, electronic health record documentation, patient education regarding recent stroke including etiology, residual deficits, importance of managing stroke risk factors and answered all other questions to patient and mother's satisfaction   Frann Rider, AGNP-BC  [facility] [address] [place], [place] [address]  Phone [phone] Fax [phone] Note: This document was prepared with digital dictation and possible smart phrase technology. Any transcriptional errors that result from this process are unintentional.

## 2020-12-08 NOTE — Progress Notes (Signed)
I agree with the above plan 

## 2020-12-08 NOTE — Patient Instructions (Signed)
Continue working with home health therapies and transition to outpatient neuro rehab therapies once completed for likely ongoing improvement  Continue baclofen for muscle spasms and left-sided spasticity -if this medication is making you sleepy, you can take a 5 mg tablet in the morning and 2x 5mg  tabs in the evening (total 10mg  nightly) for possible benefit  Continue aspirin 81 mg daily  and atorvastatin 40 mg daily for secondary stroke prevention  Continue to follow up with PCP regarding cholesterol management  Maintain strict control of cholesterol with LDL cholesterol (bad cholesterol) goal below 70 mg/dL.       Followup in the future with me in 3 months or call earlier if needed       Thank you for coming to see [place] at [facility]. I hope we have been able to provide you high quality care today.  You may receive a patient satisfaction survey over the next few weeks. We would appreciate your feedback and comments so that we may continue to improve ourselves and the health of our patients.      Pain After a Stroke After a stroke, some people experience pain as well as numbness and tingling in the face, arms, legs, shoulders, or other parts of the body. Headaches are common after a bleeding (hemorrhagic) stroke. Pain may last for a long time (be chronic) after a stroke, or it may come and go. Pain can be present right after a stroke, or it may come later. What causes pain after a stroke? Damage to the brain and nervous system can cause pain after a stroke. Weakness and inability to move (paralysis) on one side of the body can also cause pain. You may also have pain because of joint stiffness, muscle tightness, and limited movement after a stroke.   What is central post-stroke pain? Central post-stroke pain (thalamic pain syndrome) happens if the part of the brain that processes information from the senses (thalamus) gets damaged during a stroke. Central  post-stroke pain can affect one or many body parts. Symptoms of central post-stroke pain may include:  Burning, numbness, or tingling pain in the face, arms, or legs.  Constant pain that does not go away.  Pain that ranges from moderate to severe.  Pain that gets worse when you are touched or moved or when the temperature changes (allodynia). How is pain after a stroke treated? Treatment for pain may include:  Antidepressant medicines.  Medicines that prevent seizures (anticonvulsant medicines).  Muscle relaxant medicines.  Injections of medicines to reduce inflammation, such as steroids or botulinum toxin.  Physical therapy to improve strength and range of motion.  Transcutaneous electrical nerve stimulation (TENS). This is the use of electrical currents to help the muscles and nerves.  Medicines for pain control, such as NSAIDs like aspirin or ibuprofen. How can I manage pain? You can manage pain by:  Lowering and managing your stress level. If you need help with this, ask your health care provider. Consider joining a stroke support group.  Taking over-the-counter and prescription medicines as told by your health care provider.  Doing physical activities such as exercises that your health care provider approves. Stretching exercises may help to relieve muscle pain. Summary  Pain may occur after a stroke.  Damage to the brain and nervous system can cause pain after a stroke.  Treatment for pain may include medicines, lowering stress, physical activity, and other options. This information is not intended to replace advice given to you by your  health care provider. Make sure you discuss any questions you have with your health care provider. Document Revised: 10/14/2017 Document Reviewed: 02/07/2017 Elsevier Patient Education  [place].

## 2020-12-15 DIAGNOSIS — G40909 Epilepsy, unspecified, not intractable, without status epilepticus: Secondary | ICD-10-CM | POA: Diagnosis not present

## 2020-12-15 DIAGNOSIS — I10 Essential (primary) hypertension: Secondary | ICD-10-CM | POA: Diagnosis not present

## 2020-12-15 DIAGNOSIS — I69354 Hemiplegia and hemiparesis following cerebral infarction affecting left non-dominant side: Secondary | ICD-10-CM | POA: Diagnosis not present

## 2020-12-15 DIAGNOSIS — Z7982 Long term (current) use of aspirin: Secondary | ICD-10-CM | POA: Diagnosis not present

## 2020-12-15 DIAGNOSIS — Z85841 Personal history of malignant neoplasm of brain: Secondary | ICD-10-CM | POA: Diagnosis not present

## 2020-12-15 DIAGNOSIS — E039 Hypothyroidism, unspecified: Secondary | ICD-10-CM | POA: Diagnosis not present

## 2020-12-15 DIAGNOSIS — E785 Hyperlipidemia, unspecified: Secondary | ICD-10-CM | POA: Diagnosis not present

## 2020-12-15 DIAGNOSIS — I69392 Facial weakness following cerebral infarction: Secondary | ICD-10-CM | POA: Diagnosis not present

## 2020-12-17 DIAGNOSIS — E039 Hypothyroidism, unspecified: Secondary | ICD-10-CM | POA: Diagnosis not present

## 2020-12-17 DIAGNOSIS — I69354 Hemiplegia and hemiparesis following cerebral infarction affecting left non-dominant side: Secondary | ICD-10-CM | POA: Diagnosis not present

## 2020-12-17 DIAGNOSIS — G40909 Epilepsy, unspecified, not intractable, without status epilepticus: Secondary | ICD-10-CM | POA: Diagnosis not present

## 2020-12-17 DIAGNOSIS — E785 Hyperlipidemia, unspecified: Secondary | ICD-10-CM | POA: Diagnosis not present

## 2020-12-17 DIAGNOSIS — I69392 Facial weakness following cerebral infarction: Secondary | ICD-10-CM | POA: Diagnosis not present

## 2020-12-17 DIAGNOSIS — Z85841 Personal history of malignant neoplasm of brain: Secondary | ICD-10-CM | POA: Diagnosis not present

## 2020-12-17 DIAGNOSIS — Z7982 Long term (current) use of aspirin: Secondary | ICD-10-CM | POA: Diagnosis not present

## 2020-12-17 DIAGNOSIS — I10 Essential (primary) hypertension: Secondary | ICD-10-CM | POA: Diagnosis not present

## 2020-12-19 ENCOUNTER — Other Ambulatory Visit: Payer: Self-pay | Admitting: Physical Medicine & Rehabilitation

## 2020-12-29 ENCOUNTER — Other Ambulatory Visit: Payer: Self-pay

## 2020-12-29 ENCOUNTER — Encounter: Payer: Self-pay | Admitting: Physical Therapy

## 2020-12-29 ENCOUNTER — Encounter: Payer: Self-pay | Admitting: Occupational Therapy

## 2020-12-29 ENCOUNTER — Ambulatory Visit: Payer: PPO | Admitting: Physical Therapy

## 2020-12-29 ENCOUNTER — Ambulatory Visit: Payer: PPO | Attending: Physical Medicine & Rehabilitation | Admitting: Occupational Therapy

## 2020-12-29 ENCOUNTER — Telehealth: Payer: Self-pay | Admitting: Physical Therapy

## 2020-12-29 VITALS — BP 124/77 | HR 66

## 2020-12-29 DIAGNOSIS — R2689 Other abnormalities of gait and mobility: Secondary | ICD-10-CM

## 2020-12-29 DIAGNOSIS — R29818 Other symptoms and signs involving the nervous system: Secondary | ICD-10-CM | POA: Diagnosis not present

## 2020-12-29 DIAGNOSIS — R278 Other lack of coordination: Secondary | ICD-10-CM

## 2020-12-29 DIAGNOSIS — R41842 Visuospatial deficit: Secondary | ICD-10-CM | POA: Diagnosis not present

## 2020-12-29 DIAGNOSIS — I69854 Hemiplegia and hemiparesis following other cerebrovascular disease affecting left non-dominant side: Secondary | ICD-10-CM | POA: Diagnosis not present

## 2020-12-29 DIAGNOSIS — R296 Repeated falls: Secondary | ICD-10-CM

## 2020-12-29 DIAGNOSIS — R2681 Unsteadiness on feet: Secondary | ICD-10-CM | POA: Diagnosis not present

## 2020-12-29 DIAGNOSIS — M6281 Muscle weakness (generalized): Secondary | ICD-10-CM | POA: Diagnosis not present

## 2020-12-29 DIAGNOSIS — R41840 Attention and concentration deficit: Secondary | ICD-10-CM

## 2020-12-29 DIAGNOSIS — M25612 Stiffness of left shoulder, not elsewhere classified: Secondary | ICD-10-CM | POA: Diagnosis not present

## 2020-12-29 DIAGNOSIS — R41844 Frontal lobe and executive function deficit: Secondary | ICD-10-CM

## 2020-12-29 NOTE — Therapy (Signed)
[facility] [address] [place], [place], [address] Phone: [phone]   Fax:  [phone]  Occupational Therapy Evaluation  Patient Details  Name: Howard White MRN: [id number] Date of Birth: 03-17-1961 Referring Provider (OT): Alysia Penna, MD   Encounter Date: 12/29/2020   OT End of Session - 12/29/20 1552    Visit Number 1    Number of Visits 25    Date for OT Re-Evaluation 03/23/21    Authorization Type [employer]    Authorization Time Period $15 copay for OT, VL:MN    OT Start Time 0867    OT Stop Time 1530    OT Time Calculation (min) 45 min    Activity Tolerance Patient tolerated treatment well    Behavior During Therapy [facility] for tasks assessed/performed;Impulsive           Past Medical History:  Diagnosis Date  . Brain cancer ([place])   . Seizures ([place])   . Thyroid disease    Hypothyroid    Past Surgical History:  Procedure Laterality Date  . brain cancer    . BRAIN SURGERY      There were no vitals filed for this visit.   Subjective Assessment - 12/29/20 1541    Subjective  Pt reports no pain today. Pt reports wanting to get back to walking again and primiary deficits are his "foot, hand and balance". "I want to use a cane".    Patient is accompanied by: Family member   mom and dad   Pertinent History Brain Tumor 2001. Hx of Seizures    Limitations Seizures. Fall Risk    Patient Stated Goals Walk again and use a cane. "foot, hand and balance"    Currently in Pain? No/denies   pt denies pain but reports it has been very bad in the past            [facility] OT Assessment - 12/29/20 1449      Assessment   Medical Diagnosis CVA of Right basal ganglia    Referring Provider (OT) Alysia Penna, MD    Onset Date/Surgical Date 09/26/20    Hand Dominance Right    Prior Therapy Inpatient Rehab & HHOT, PT and ST      Precautions   Precautions Fall    Precaution Comments history of  seizures. fall risk. not driving      Balance Screen   Has the patient fallen in the past 6 months Yes    How many times? 2   defer to PT     Home  Environment   Family/patient expects to be discharged to: Private residence   was living with parents prior to CVA   Living Arrangements Parent   mom and dad   Type of [place] entrance    [place] to live on main level with bedroom/bathroom    Bathroom Shower/Tub Tub/Shower unit   has tub bench   Bathroom Accessibility Yes    Home Equipment Bedside commode;Tub bench;Hand held shower head;Wheelchair - manual    Lives With Family      Prior Function   Level of Independence Independent    Vocation Retired    Leisure sing in the choir, walking, running, Engineer, maintenance (IT), art      ADL   Eating/Feeding Needs assist with cutting food    Grooming Supervision/safety   using Chief Technology Officer. dad is helping some   Upper Body Bathing Modified independent  Lower Body Bathing Minimal assistance    Upper Body Dressing Increased time    Lower Body Dressing Minimal assistance   some assistance with clothing management and brief   Toilet Transfer Supervision/safety    Toileting - Clothing Manipulation Increased time    Toileting -  Hygiene Minimal assistance    Tub/Shower Transfer Min guard      IADL   Prior Level of Function Shopping did small purchases prior    Shopping Needs to be accompanied on any shopping trip   does not participate in this currently   Prior Level of Function Light Housekeeping independent    Light Housekeeping Does not participate in any housekeeping tasks    Prior Level of Function Meal Prep small sandwiches, microwave meals, etc    Meal Prep Does not utilize stove or oven;Needs to have meals prepared and served    Prior Level of Landscape architect drove before    [employer] on family or friends for transportation    Prior Level of Function Medication Managment mom  helped prior to CVA    Medication Management Is not capable of dispensing or managing own medication    Prior Level of Function Financial Management independent    Financial Management Requires supervision/minimal cuing;Manages financial matters independently (budgets, writes checks, pays rent, bills goes to bank), collects and keeps track of income      Mobility   Mobility Status Needs assist;History of falls    Mobility Status Comments arrived to evaluation in a w/c      Written Expression   Dominant Hand Right   "it's like a doctor"     Vision - History   Baseline Vision Wears glasses all the time   has prisms, reading glasses, progressives   Visual History Brain injury   brain tumor 2001 with diplopia     Vision Assessment   Visual Fields Right visual field deficit   d/t brain tumor 2001   Diplopia Assessment --   diplopia resolved with prisms   Comment pt reports all visual deficits are from brain tumor in 2001      Cognition   Behaviors Impulsive;Restless;Other (comment)   hyperverbal   Cognition Comments frequently repeated self - short term memory deficits noted. Attention deficits noted. Problem solving deficits noted.      Observation/Other Assessments   Focus on Therapeutic Outcomes (FOTO)  54%      Posture/Postural Control   Posture/Postural Control Postural limitations    Postural Limitations Forward head      Sensation   Light Touch Appears Intact    Stereognosis Appears Intact    Hot/Cold Appears Intact      Coordination   Gross Motor Movements are Fluid and Coordinated No   BUE impaired - L much more impaired than R   Fine Motor Movements are Fluid and Coordinated No    9 Hole Peg Test Right;Left    Right 9 Hole Peg Test 28.5s    Left 9 Hole Peg Test 2 pegs in 2 minutes   used RUE to place peg in LUE   Box and Blocks RUE 41; LUE 18    Coordination impaired L>R but R has slight impairments   left side from CVA; R previous     Praxis   Praxis Impaired     Praxis Impairment Details Motor planning      ROM / Strength   AROM / PROM / Strength AROM;Strength      AROM  Overall AROM  Deficits    Overall AROM Comments RUE WFL    AROM Assessment Site Shoulder;Wrist    Right/Left Shoulder Left    Left Shoulder Flexion 120 Degrees    Left Shoulder ABduction 95 Degrees    Right/Left Wrist Left    Left Wrist Extension 30 Degrees   passive - pt unable to maintain wrist extension actively     Strength   Overall Strength Deficits    Overall Strength Comments RUE WFL; LUE Deficits      Hand Function   Right Hand Gross Grasp Functional    Right Hand Grip (lbs) 64.3    Left Hand Gross Grasp Functional    Left Hand Grip (lbs) 28.2                             OT Short Term Goals - 12/29/20 1553      OT SHORT TERM GOAL #1   Title Pt will be independent with initial HEP 01/26/2021    Time 4    Period Weeks    Status New    Target Date 01/26/21      OT SHORT TERM GOAL #2   Title Pt will improve wrist extension in LUE to 35 degrees for improving functional use of LUE.    Baseline LUE 30 passive - unable to maintain position actively.    Time 4    Period Weeks    Status New      OT SHORT TERM GOAL #3   Title Pt will improve attention, balance and range of motion in order to complete toilet hygiene with no physical assistance.    Time 4    Period Weeks    Status New      OT SHORT TERM GOAL #4   Title Pt will perform home management and/or simple meal prep with supervision and good safety awareness    Time 4    Period Weeks    Status New      OT SHORT TERM GOAL #5   Title Pt will perform all basic ADLs without physical assistance.    Time 4    Period Weeks    Status New      OT SHORT TERM GOAL #6   Title Pt will improve functional use of LUE by scoring 25 or greater on Box and Blocks with LUE.    Baseline RUE 41, LUE 18    Time 4    Period Weeks    Status New             OT Long Term Goals - 12/29/20  1559      OT LONG TERM GOAL #1   Title Pt will be independent with updated HEP    Time 12    Period Weeks    Status New    Target Date 03/23/21      OT LONG TERM GOAL #2   Title Pt will complete environmental scanning with 95% accuracy.    Time 12    Period Weeks    Status New      OT LONG TERM GOAL #3   Title Pt will attend to a novel cognitive task for 15 minutes or greater in a moderately distracting environment with no cues for redirection.    Time 12    Period Weeks    Status New      OT LONG TERM GOAL #4   Title  Pt will achieve wrist extension of 45 degrees or greater in LUE    Baseline 30*    Time 12    Period Weeks    Status New      OT LONG TERM GOAL #5   Title Pt will improve grip strength in LUE to 30 lbs or greater for increase in ability to complete clothing management and other functional tasks with LUE.    Baseline LUE 18 RUE 41    Time 12    Period Weeks    Status New      OT LONG TERM GOAL #6   Title Pt will improve fine motor coordination in LUE by completing 9 hole peg test in 2 minutes or less    Baseline 2 pegs in 2 minutes    Time 12    Period Weeks    Status New      OT LONG TERM GOAL #7   Title Pt will complete FOTO And score 66% or greater.    Baseline 54%    Time 12    Period Weeks    Status New                 Plan - 12/29/20 1529    Clinical Impression Statement Pt is a 60 year old male that presents to Neuro OPOT s/p CVA of right basal ganglia with history of astrocytoma of the brain and seizures. Pt was living with parents prior to CVA d/t previous deficits but has had increased deficits since CVA.  Pt presents with LUE weakness, decreased coordination, deficits with memory and attention. Skilled occupational therapy is recommended to target listed areas of deficit and increase independence with daily functions and decrease caregiver burden.    OT Occupational Profile and History Detailed Assessment- Review of Records and  additional review of physical, cognitive, psychosocial history related to current functional performance    Occupational performance deficits (Please refer to evaluation for details): ADL's    Body Structure / Function / Physical Skills ADL;ROM;UE functional use;FMC;Decreased knowledge of use of DME;Balance;Vision;Dexterity;GMC;Coordination;IADL;Proprioception;Tone;Strength;Pain;Endurance    Cognitive Skills Attention;Safety Awareness;Sequencing;Orientation;Memory;Understand;Problem Solve    Rehab Potential Good    Clinical Decision Making Several treatment options, min-mod task modification necessary    Comorbidities Affecting Occupational Performance: May have comorbidities impacting occupational performance    Modification or Assistance to Complete Evaluation  No modification of tasks or assist necessary to complete eval    OT Frequency 2x / week    OT Duration 12 weeks   24 visits + eval   OT Treatment/Interventions Self-care/ADL training;Therapeutic exercise;Visual/perceptual remediation/compensation;Patient/family education;Splinting;Neuromuscular education;Moist Heat;Aquatic Therapy;DME and/or AE instruction;Passive range of motion;Therapist, nutritional;Therapeutic activities;Energy conservation;Electrical Stimulation;Manual Therapy    Plan LUE HEP for ROM and coordination    Recommended Other Services consider ST?    Consulted and Agree with Plan of Care Patient;Family member/caregiver   mom and dad          Patient will benefit from skilled therapeutic intervention in order to improve the following deficits and impairments:   Body Structure / Function / Physical Skills: ADL,ROM,UE functional use,FMC,Decreased knowledge of use of DME,Balance,Vision,Dexterity,GMC,Coordination,IADL,Proprioception,Tone,Strength,Pain,Endurance Cognitive Skills: Attention,Safety Awareness,Sequencing,Orientation,Memory,Understand,Problem Solve     Visit Diagnosis: Hemiplegia and hemiparesis  following other cerebrovascular disease affecting left non-dominant side ([place]) - Plan: Ot plan of care cert/re-cert  Muscle weakness (generalized) - Plan: Ot plan of care cert/re-cert  Other abnormalities of gait and mobility - Plan: Ot plan of care cert/re-cert  Other lack of coordination - Plan:  Ot plan of care cert/re-cert  Unsteadiness on feet - Plan: Ot plan of care cert/re-cert  Visuospatial deficit - Plan: Ot plan of care cert/re-cert  Attention and concentration deficit - Plan: Ot plan of care cert/re-cert  Stiffness of left shoulder, not elsewhere classified - Plan: Ot plan of care cert/re-cert  Frontal lobe and executive function deficit - Plan: Ot plan of care cert/re-cert    Problem List Patient Active Problem List   Diagnosis Date Noted  . Slow transit constipation   . Sleep disturbance   . Basal ganglia stroke ([place])   . Incontinence of feces   . Leukocytosis   . Seizures ([place])   . Spastic hemiparesis ([place])   . Cerebrovascular accident (CVA) of right basal ganglia ([place]) 09/30/2020  . CVA (cerebral vascular accident) ([place]) 09/26/2020  . Gait disorder 10/12/2016  . Memory loss 10/12/2016  . Epilepsy ([place]) 03/30/2013  . Malignant neoplasm of brain ([place]) 03/30/2013  . Astrocytoma brain tumor [facility]) 03/27/2012    Zachery Conch MOT, OTR/L  12/29/2020, 4:09 PM  [facility] [address] [place], [place], [address] Phone: [phone]   Fax:  [phone]  Name: Caulder Wehner MRN: [id number] Date of Birth: 03-18-1961

## 2020-12-29 NOTE — Therapy (Signed)
[facility] [address] [place], [place], [address] Phone: [phone]   Fax:  [phone]  Physical Therapy Evaluation  Patient Details  Name: Howard White MRN: [id number] Date of Birth: 08-16-1961 Referring Provider (PT): Letta Pate Luanna Salk, MD   Encounter Date: 12/29/2020   PT End of Session - 12/29/20 1743    Visit Number 1    Number of Visits 13    Date for PT Re-Evaluation 02/27/21    Authorization Type HTA    Progress Note Due on Visit 10    PT Start Time 0277    PT Stop Time 1620    PT Time Calculation (min) 50 min    Equipment Utilized During Treatment Gait belt    Activity Tolerance Patient tolerated treatment well    Behavior During Therapy Impulsive           Past Medical History:  Diagnosis Date  . Brain cancer ([facility])   . Seizures ([facility])   . Thyroid disease    Hypothyroid    Past Surgical History:  Procedure Laterality Date  . brain cancer    . BRAIN SURGERY      Vitals:   12/29/20 1535  BP: 124/77  Pulse: 66      Subjective Assessment - 12/29/20 1537    Subjective CVA occurred in November 2021, went through therapy on acute, CIR, HH.  Pt reports whole L side has been compromised since the CVA.  L foot drags when he walks.  Was mainly using the wheelchair in the house but would like to focus on using the cane.  Feels very off balance.  No dizziness.  Has premorbid diplopia and uses prisms.  Has had a couple of falls with his mother.  Mother reports patient is impulsive.    Patient is accompained by: Family member    Pertinent History Epilepsy, malignant neoplasm of brain, astrocytoma brain tumor, thyroid disease, brain surgery    Limitations Standing;Walking    Patient Stated Goals Work on his balance and be able to walk by himself.    Currently in Pain? No/denies              [facility] PT Assessment - 12/29/20 1543      Assessment   Medical Diagnosis Basal Ganglia CVA; L  hemiparesis    Referring Provider (PT) Letta Pate, Luanna Salk, MD    Onset Date/Surgical Date 09/26/21    Hand Dominance Right    Prior Therapy acute, CIR, HH      Precautions   Precautions Fall    Precaution Comments Epilepsy, malignant neoplasm of brain, astrocytoma brain tumor, thyroid disease, brain surgery      Balance Screen   Has the patient fallen in the past 6 months Yes    How many times? 5    Has the patient had a decrease in activity level because of a fear of falling?  Yes      [place] residence    Living Arrangements Parent    Type of [place] Access Level entry    [place] to live on main level with bedroom/bathroom;Multi-level    Alternate Level Stairs-Number of Steps 12    Alternate Level Stairs-Rails Right    [place] - single point;Bedside commode;Wheelchair - Insurance claims handler - 2 wheels    Additional Comments Lived with parents prior to CVA; his bedroom is upstairs but has been staying in  a downstairs bedroom since CVA.  Has not driven in 3 years      Prior Function   Level of Independence Independent    Vocation Retired    Leisure sing in the choir, walking, running, Engineer, maintenance (IT), Engineer, site, swimming      Cognition   Behaviors Impulsive;Restless      Observation/Other Assessments   Focus on Therapeutic Outcomes (FOTO)  Stroke LE: 54%      Sensation   Light Touch Appears Intact    Hot/Cold Appears Intact      Coordination   Gross Motor Movements are Fluid and Coordinated No    Heel Shin Test mild ataxia LLE      Posture/Postural Control   Posture/Postural Control Postural limitations    Postural Limitations Rounded Shoulders;Decreased lumbar lordosis;Flexed trunk;Weight shift right   L hip elevated   Posture Comments Keeps head and body tilted to the R      ROM / Strength   AROM / PROM / Strength Strength      Strength   Overall Strength Comments 5/5 MMT but impaired  coordination in LLE      Transfers   Transfers Sit to Stand;Stand to Sit    Sit to Stand 4: Min assist;3: Mod assist    Sit to Stand Details (indicate cue type and reason) uncontrolled sit <> stand with frequent LOB posteriorly    Stand to Sit 3: Mod assist    Comments Impulsive; one time during session pt stood from wheelchair with brakes unlocked pushing wheelchair back into his father; therapist had to intervene to prevent a fall and prevent wheelchair from hitting father      Ambulation/Gait   Ambulation/Gait Yes    Ambulation/Gait Assistance 4: Min assist;3: Mod assist    Ambulation/Gait Assistance Details with Hurrycane; increased LUE flexor tone during gait and foot drag noted on LLE.  Keeps gaze down and head/trunk laterally flexed to R.  One significant LOB to R.    Ambulation Distance (Feet) 115 Feet    Assistive device Straight cane    Gait Pattern Step-to pattern;Decreased arm swing - left;Decreased step length - left;Decreased stance time - left;Decreased stride length;Decreased hip/knee flexion - left;Decreased dorsiflexion - left;Lateral trunk lean to right;Trunk flexed;Poor foot clearance - left    Ambulation Surface Level;Indoor    Stairs Yes    Stairs Assistance 3: Mod assist    Stairs Assistance Details (indicate cue type and reason) Min A to ascend due to poor LLE clearance.  When descending required assistance to prevent posterior fall/LOB.  Cues to attend to LUE    Stair Management Technique One rail Right;Alternating pattern;Step to pattern;Forwards    Number of Stairs 4    Height of Stairs 6      Press photographer Both lower extermities    Wheelchair Parts Management Supervision/cueing    Distance 115      Standardized Balance Assessment   Standardized Balance Assessment Berg Balance Test;Five Times Sit to Stand;Dynamic Gait Index    Five times sit to stand comments  22 seconds from wheelchair, uncontrolled  with posterior LOB      Berg Balance Test   Sit to Stand Needs minimal aid to stand or to stabilize    Standing Unsupported Able to stand 2 minutes with supervision    Sitting with Back Unsupported but Feet Supported on Floor or Stool Able to sit safely and securely 2 minutes  Stand to Sit Sits independently, has uncontrolled descent    Transfers Needs one person to assist    Standing Unsupported with Eyes Closed Able to stand 10 seconds with supervision    Standing Unsupported with Feet Together Needs help to attain position but able to stand for 30 seconds with feet together    From Standing, Reach Forward with Outstretched Arm Can reach forward >12 cm safely (5")    From Standing Position, Pick up Object from Floor Able to pick up shoe, needs supervision    From Standing Position, Turn to Look Behind Over each Shoulder Looks behind one side only/other side shows less weight shift    Turn 360 Degrees Needs assistance while turning    Standing Unsupported, Alternately Place Feet on Step/Stool Needs assistance to keep from falling or unable to try    Standing Unsupported, One Foot in Front Needs help to step but can hold 15 seconds    Standing on One Leg Able to lift leg independently and hold equal to or more than 3 seconds    Total Score 26    Berg comment: 26/56      Dynamic Gait Index   Level Surface Severe Impairment    Change in Gait Speed Mild Impairment    Gait with Horizontal Head Turns Mild Impairment    Gait with Vertical Head Turns Mild Impairment    Gait and Pivot Turn Moderate Impairment    Step Over Obstacle Severe Impairment    Step Around Obstacles Severe Impairment    Steps Severe Impairment    Total Score 7    DGI comment: 7/24                      Objective measurements completed on examination: See above findings.               PT Education - 12/29/20 1742    Education Details clinical findings, PT POC and goals, aquatic therapy     Person(s) Educated Patient;Parent(s)    Methods Explanation    Comprehension Verbalized understanding            PT Short Term Goals - 12/29/20 1757      PT SHORT TERM GOAL #1   Title Pt will demonstrate compliance with initial HEP with supervision of parents    Time 4    Period Weeks    Status New    Target Date 01/28/21      PT SHORT TERM GOAL #2   Title Pt will demonstrate improved safety with transfers and will decrease time to perform five time sit to stand to 18 seconds with min A    Baseline 22 seconds with mod A due to impulsivity    Time 4    Period Weeks    Status New    Target Date 01/28/21      PT SHORT TERM GOAL #3   Title Pt will improve BERG to >/= 30/56 to indicate decreased falls risk    Baseline 26/56    Time 4    Period Weeks    Status New    Target Date 01/28/21      PT SHORT TERM GOAL #4   Title Pt will demonstrate decreased falls risk when ambulating with cane as indicated by increase in DGI to >/= 11/24    Baseline 7/24    Time 4    Period Weeks    Status New    Target Date  01/28/21             PT Long Term Goals - 12/29/20 1759      PT LONG TERM GOAL #1   Title Pt will demonstrate ability to perform final HEP and walking program with supervision    Time 8    Period Weeks    Status New    Target Date 02/27/21      PT LONG TERM GOAL #2   Title Pt will report improvement in LE function on FOTO to >/= 66%    Baseline 54%    Time 8    Period Weeks    Status New    Target Date 02/27/21      PT LONG TERM GOAL #3   Title Pt will decrease five time sit to stand to </= 15 seconds with supervision    Time 8    Period Weeks    Status New    Target Date 02/27/21      PT LONG TERM GOAL #4   Title Pt will increase BERG to >/= 40/56    Time 8    Period Weeks    Status New    Target Date 02/27/21      PT LONG TERM GOAL #5   Title Pt will increase DGI to >/= 18/24    Time 8    Period Weeks    Status New    Target Date 02/27/21       PT LONG TERM GOAL #6   Title Pt will demonstrate ability to safely stand from the floor MOD I with use of UE    Time 8    Period Weeks    Status New    Target Date 02/27/21      PT LONG TERM GOAL #7   Title Pt will ambulate x 500' outside with LRAD and supervision and will negotiate 12 stairs with R rail (ascending) with supervision to be able to access upstairs bedroom    Time 8    Period Weeks    Status New    Target Date 02/27/21                  Plan - 12/29/20 1744    Clinical Impression Statement Pt is a 60 year old male referred to Neuro OPPT for evaluation of L hemiparesis after R basal ganglia CVA.  Pt's PMH is significant for the following: Epilepsy, malignant neoplasm of brain, astrocytoma brain tumor, thyroid disease, brain surgery. The following deficits were noted during pt's exam: impaired cognition with impulsivity and inattention to LUE and LLE, impaired coordination, impaired posture, and postural control with bias to the R, impaired standing balance, abnormal gait and low back pain.  Pt's BERG, DGI and five time sit to stand scores indicate pt is at high risk for falls. Pt would benefit from skilled PT to address these impairments and functional limitations to maximize functional mobility independence and reduce falls risk.    Personal Factors and Comorbidities Comorbidity 3+;Past/Current Experience;Social Background    Comorbidities Epilepsy, malignant neoplasm of brain, astrocytoma brain tumor, thyroid disease, brain surgery, lives with elderly parents    Examination-Activity Limitations [place];Locomotion Level;Stairs;Transfers;Bathing;Toileting    Examination-Participation Restrictions Community Activity;Meal Prep;Shop    Stability/Clinical Decision Making Evolving/Moderate complexity    Clinical Decision Making Moderate    Rehab Potential Good    PT Frequency 2x / week    PT Duration 8 weeks    PT Treatment/Interventions ADLs/Self Care Home  Management;Aquatic Therapy;DME Instruction;Gait training;Cognitive remediation;Stair training;Patient/family education;Functional mobility training;Orthotic Fit/Training;Therapeutic activities;Therapeutic exercise;Balance training;Neuromuscular re-education;Passive range of motion;Vestibular;Visual/perceptual remediation/compensation    PT Next Visit Plan Initiate HEP focusing on posture, controlled sit <> stand, standing balance/coordination.  NMR/WB focusing on attention to L, balance when turning, reorientation to midline.  Gait training/safety with cane; stair negotiation.                When I see Jorel again I would like to do a vestibular assessment.    Recommended Other Services Speech therapy for cognition.  Aquatic therapy with PT or OT    Consulted and Agree with Plan of Care Patient;Family member/caregiver    Family Member Consulted parents           Patient will benefit from skilled therapeutic intervention in order to improve the following deficits and impairments:  Abnormal gait,Decreased balance,Decreased cognition,Decreased coordination,Difficulty walking,Impaired tone,Impaired UE functional use,Impaired vision/preception,Postural dysfunction,Pain  Visit Diagnosis: Repeated falls  Hemiplegia and hemiparesis following other cerebrovascular disease affecting left non-dominant side ([facility])  Other abnormalities of gait and mobility  Other lack of coordination  Unsteadiness on feet  Other symptoms and signs involving the nervous system     Problem List Patient Active Problem List   Diagnosis Date Noted  . Slow transit constipation   . Sleep disturbance   . Basal ganglia stroke ([facility])   . Incontinence of feces   . Leukocytosis   . Seizures ([facility])   . Spastic hemiparesis ([place])   . Cerebrovascular accident (CVA) of right basal ganglia ([facility]) 09/30/2020  . CVA (cerebral vascular accident) ([facility]) 09/26/2020  . Gait disorder 10/12/2016  . Memory loss 10/12/2016  . Epilepsy  ([facility]) 03/30/2013  . Malignant neoplasm of brain ([facility]) 03/30/2013  . Astrocytoma brain tumor ([facility]) 03/27/2012    Rico Junker, PT, DPT 12/29/20    6:05 PM    [facility] [address] [place], [place], [address] Phone: [phone]   Fax:  [phone]  Name: Aragon Scarantino MRN: [id number] Date of Birth: [birth year]

## 2020-12-29 NOTE — Telephone Encounter (Signed)
Hello Dr. Krista Blue and Janett Billow, I wanted to reach out to both of you since you both have been involved in Howard White's care for seizures and now his most recent CVA.    PT and OT feel he would benefit from aquatic therapy but wanted to see if you had any concerns with him participating in aquatic therapy in 3-4 ft of water with his history of seizures.    Please let us know if you feel it would be contraindicated for any reason.   Thanks, Rico Junker, PT, DPT 12/29/20    6:09 PM

## 2020-12-30 DIAGNOSIS — R159 Full incontinence of feces: Secondary | ICD-10-CM | POA: Diagnosis not present

## 2020-12-30 DIAGNOSIS — I6381 Other cerebral infarction due to occlusion or stenosis of small artery: Secondary | ICD-10-CM | POA: Diagnosis not present

## 2020-12-30 NOTE — Telephone Encounter (Signed)
Chart reviewed last reported seizure was in November 2019,  If his seizure is under good control, I see no reason for him not to go so aquatic therapy, but better to have somebody keep a close eye on him when he is in the water, he needs somebody drive to access to aquatic exercise anyway

## 2021-01-06 ENCOUNTER — Other Ambulatory Visit: Payer: Self-pay

## 2021-01-06 ENCOUNTER — Ambulatory Visit: Payer: PPO | Admitting: Occupational Therapy

## 2021-01-06 DIAGNOSIS — I69854 Hemiplegia and hemiparesis following other cerebrovascular disease affecting left non-dominant side: Secondary | ICD-10-CM | POA: Diagnosis not present

## 2021-01-06 DIAGNOSIS — R2681 Unsteadiness on feet: Secondary | ICD-10-CM

## 2021-01-06 DIAGNOSIS — R278 Other lack of coordination: Secondary | ICD-10-CM

## 2021-01-06 DIAGNOSIS — R29818 Other symptoms and signs involving the nervous system: Secondary | ICD-10-CM

## 2021-01-06 NOTE — Patient Instructions (Signed)
Basic Activities:   Use your affected hand to perform the following activities for 20-30 minutes 1-2 times/day.  Stop activity if you experience pain.  - Wipe table top--forward/back, side to side (slow and controlled) - Bring plastic cup to mouth, then return to table top and release--no chicken wing - Flip playing cards--keep elbow down, turn wrist and open fingers to release - Pick up cotton balls, checkers, blocks, dominoes, bottle caps and place in a container--no chicken wing  -  Fold towels, wash cloths, and hand towels

## 2021-01-06 NOTE — Therapy (Signed)
[facility] [address] [place], [place], [address] Phone: [phone]   Fax:  [phone]  Occupational Therapy Treatment  Patient Details  Name: Howard White MRN: [id number] Date of Birth: 1961-08-01 Referring Provider (OT): Alysia Penna, MD   Encounter Date: 01/06/2021   OT End of Session - 01/06/21 1456    Visit Number 2    Number of Visits 25    Date for OT Re-Evaluation 03/23/21    Authorization Type [employer]    Authorization Time Period $15 copay for OT, VL:MN    OT Start Time 6962    OT Stop Time 1315    OT Time Calculation (min) 40 min    Activity Tolerance Patient tolerated treatment well    Behavior During Therapy [facility] for tasks assessed/performed;Impulsive           Past Medical History:  Diagnosis Date  . Brain cancer ([facility])   . Seizures ([facility])   . Thyroid disease    Hypothyroid    Past Surgical History:  Procedure Laterality Date  . brain cancer    . BRAIN SURGERY      There were no vitals filed for this visit.   Subjective Assessment - 01/06/21 1238    Subjective  I am strong with this arm, but I am not coordinated    Patient is accompanied by: Family member   mom and dad   Pertinent History Brain Tumor 2001. Hx of Seizures    Limitations Seizures. Fall Risk    Patient Stated Goals Walk again and use a cane. "foot, hand and balance"    Currently in Pain? No/denies             AROM wrist ext with min cueing (verbal and tactile for isolated movement)  Pt reports difficulty donning jacket how he did previously.  Tried donning by putting LUE in first and reaching around to pull from collar and pt performed x2 with mod cueing.  (Used to put arms in first and slip overhead).  Recommended pt perform which way was easier for him at home (may need to address further as limited today due to time constraints).  Recommended and practice pt doffing jacket by pulling overhead by  reaching back with RUE.  This was easier per pt report and therapist recommended this strategy at home.      OT Education - 01/06/21 1456    Education Details Initial HEP for LUE coordination/functional use--see pt instructions    Person(s) Educated Patient;Parent(s)    Methods Explanation;Demonstration;Verbal cues;Handout;Tactile cues   cueing for technique and decr compensation   Comprehension Verbalized understanding;Returned demonstration;Verbal cues required;Tactile cues required;Need further instruction            OT Short Term Goals - 12/29/20 1553      OT SHORT TERM GOAL #1   Title Pt will be independent with initial HEP 01/26/2021    Time 4    Period Weeks    Status New    Target Date 01/26/21      OT SHORT TERM GOAL #2   Title Pt will improve wrist extension in LUE to 35 degrees for improving functional use of LUE.    Baseline LUE 30 passive - unable to maintain position actively.    Time 4    Period Weeks    Status New      OT SHORT TERM GOAL #3   Title Pt will improve attention, balance and range of motion in  order to complete toilet hygiene with no physical assistance.    Time 4    Period Weeks    Status New      OT SHORT TERM GOAL #4   Title Pt will perform home management and/or simple meal prep with supervision and good safety awareness    Time 4    Period Weeks    Status New      OT SHORT TERM GOAL #5   Title Pt will perform all basic ADLs without physical assistance.    Time 4    Period Weeks    Status New      OT SHORT TERM GOAL #6   Title Pt will improve functional use of LUE by scoring 25 or greater on Box and Blocks with LUE.    Baseline RUE 41, LUE 18    Time 4    Period Weeks    Status New             OT Long Term Goals - 12/29/20 1559      OT LONG TERM GOAL #1   Title Pt will be independent with updated HEP    Time 12    Period Weeks    Status New    Target Date 03/23/21      OT LONG TERM GOAL #2   Title Pt will complete  environmental scanning with 95% accuracy.    Time 12    Period Weeks    Status New      OT LONG TERM GOAL #3   Title Pt will attend to a novel cognitive task for 15 minutes or greater in a moderately distracting environment with no cues for redirection.    Time 12    Period Weeks    Status New      OT LONG TERM GOAL #4   Title Pt will achieve wrist extension of 45 degrees or greater in LUE    Baseline 30*    Time 12    Period Weeks    Status New      OT LONG TERM GOAL #5   Title Pt will improve grip strength in LUE to 30 lbs or greater for increase in ability to complete clothing management and other functional tasks with LUE.    Baseline LUE 18 RUE 41    Time 12    Period Weeks    Status New      OT LONG TERM GOAL #6   Title Pt will improve fine motor coordination in LUE by completing 9 hole peg test in 2 minutes or less    Baseline 2 pegs in 2 minutes    Time 12    Period Weeks    Status New      OT LONG TERM GOAL #7   Title Pt will complete FOTO And score 66% or greater.    Baseline 54%    Time 12    Period Weeks    Status New                 Plan - 01/06/21 1457    Clinical Impression Statement Pt with compensations patterns with LUE use, but responded to cueing with decr compensation patterns.  Pt would benefit from review of initial HEP.    OT Occupational Profile and History Detailed Assessment- Review of Records and additional review of physical, cognitive, psychosocial history related to current functional performance    Occupational performance deficits (Please refer to evaluation for details): ADL's  Body Structure / Function / Physical Skills ADL;ROM;UE functional use;FMC;Decreased knowledge of use of DME;Balance;Vision;Dexterity;GMC;Coordination;IADL;Proprioception;Tone;Strength;Pain;Endurance    Cognitive Skills Attention;Safety Awareness;Sequencing;Orientation;Memory;Understand;Problem Solve    Rehab Potential Good    Clinical Decision Making  Several treatment options, min-mod task modification necessary    Comorbidities Affecting Occupational Performance: May have comorbidities impacting occupational performance    Modification or Assistance to Complete Evaluation  No modification of tasks or assist necessary to complete eval    OT Frequency 2x / week    OT Duration 12 weeks   24 visits + eval   OT Treatment/Interventions Self-care/ADL training;Therapeutic exercise;Visual/perceptual remediation/compensation;Patient/family education;Splinting;Neuromuscular education;Moist Heat;Aquatic Therapy;DME and/or AE instruction;Passive range of motion;Therapist, nutritional;Therapeutic activities;Energy conservation;Electrical Stimulation;Manual Therapy    Plan Review initial HEP and add/update prn    Recommended Other Services consider ST?    Consulted and Agree with Plan of Care Patient;Family member/caregiver   mom and dad          Patient will benefit from skilled therapeutic intervention in order to improve the following deficits and impairments:   Body Structure / Function / Physical Skills: ADL,ROM,UE functional use,FMC,Decreased knowledge of use of DME,Balance,Vision,Dexterity,GMC,Coordination,IADL,Proprioception,Tone,Strength,Pain,Endurance Cognitive Skills: Attention,Safety Awareness,Sequencing,Orientation,Memory,Understand,Problem Solve     Visit Diagnosis: Hemiplegia and hemiparesis following other cerebrovascular disease affecting left non-dominant side ([facility])  Other lack of coordination  Unsteadiness on feet  Other symptoms and signs involving the nervous system    Problem List Patient Active Problem List   Diagnosis Date Noted  . Slow transit constipation   . Sleep disturbance   . Basal ganglia stroke ([facility] [facility])   . Incontinence of feces   . Leukocytosis   . Seizures ([facility])   . Spastic hemiparesis ([place])   . Cerebrovascular accident (CVA) of right basal ganglia ([facility]) 09/30/2020  . CVA (cerebral vascular  accident) ([place]) 09/26/2020  . Gait disorder 10/12/2016  . Memory loss 10/12/2016  . Epilepsy ([facility]) 03/30/2013  . Malignant neoplasm of brain ([facility]) 03/30/2013  . Astrocytoma brain tumor [facility]) 03/27/2012    [facility] 01/06/2021, 3:04 PM  [facility] [address] [place], [place], [address] Phone: [phone]   Fax:  [phone]  Name: Howard White MRN: [id number] Date of Birth: 03/11/1961   Vianne Bulls, OTR/L [facility] [address] [place], [place]  [address] [phone] phone [phone] 01/06/21 3:04 PM

## 2021-01-08 ENCOUNTER — Other Ambulatory Visit: Payer: Self-pay

## 2021-01-08 ENCOUNTER — Ambulatory Visit: Payer: PPO | Admitting: Physical Therapy

## 2021-01-08 ENCOUNTER — Encounter: Payer: Self-pay | Admitting: Physical Therapy

## 2021-01-08 ENCOUNTER — Encounter: Payer: PPO | Attending: Registered Nurse | Admitting: Physical Medicine & Rehabilitation

## 2021-01-08 ENCOUNTER — Encounter: Payer: Self-pay | Admitting: Physical Medicine & Rehabilitation

## 2021-01-08 VITALS — BP 107/71 | HR 67 | Temp 98.2°F | Ht 70.0 in | Wt 184.0 lb

## 2021-01-08 DIAGNOSIS — I69854 Hemiplegia and hemiparesis following other cerebrovascular disease affecting left non-dominant side: Secondary | ICD-10-CM | POA: Diagnosis not present

## 2021-01-08 DIAGNOSIS — R2681 Unsteadiness on feet: Secondary | ICD-10-CM

## 2021-01-08 DIAGNOSIS — M6281 Muscle weakness (generalized): Secondary | ICD-10-CM

## 2021-01-08 DIAGNOSIS — R29818 Other symptoms and signs involving the nervous system: Secondary | ICD-10-CM

## 2021-01-08 DIAGNOSIS — G8110 Spastic hemiplegia affecting unspecified side: Secondary | ICD-10-CM | POA: Diagnosis not present

## 2021-01-08 DIAGNOSIS — R2689 Other abnormalities of gait and mobility: Secondary | ICD-10-CM

## 2021-01-08 DIAGNOSIS — R296 Repeated falls: Secondary | ICD-10-CM

## 2021-01-08 MED ORDER — BACLOFEN 5 MG PO TABS: 5.0000 mg | ORAL_TABLET | Freq: Three times a day (TID) | ORAL | 2 refills | Status: DC

## 2021-01-08 NOTE — Patient Instructions (Signed)
Please be aware that not sleeping well can throw off your function  Practice finger to thumb opposition

## 2021-01-08 NOTE — Patient Instructions (Addendum)
   Aquatic Therapy: What to Expect!  Where:  [facility] [address] [place], [place] [address] [phone]           How to Prepare: . Please make sure you drink 8 ounces of water about one hour prior to your pool session . A caregiver must attend the entire session with the patient (unless your primary therapists feels this is not necessary). The caregiver will be responsible for assisting with dressing as well as any toileting needs.  . Please arrive IN YOUR SUIT and a few minutes prior to your appointment - this helps to avoid delays in starting your session. . Please make sure to attend to any toileting needs prior to entering the pool . Once on the pool deck your therapist will ask you to sign the Patient  Consent and Assignment of Benefits form . Your therapist may take your blood pressure prior to, during and after your session if indicated . We usually try and create a home exercise program based on activities we do in the pool.  Please be thinking about who might be able to assist you in the pool should you want to participate in an aquatic home exercise program at the time of discharge.  Some patients do not want to or do not have the ability to participate in an aquatic home program - this is not a barrier in any way to you participating in aquatic therapy as part of your current therapy plan!    About the pool: 1. Entering the pool Your therapist will assist you; there are multiple ways to enter including stairs with railings, a walk in ramp, a roll in chair and a mechanical lift. Your therapist will determine the most appropriate way for you. 2. Water temperature is usually between 86-87 degrees 3. There may be other swimmers in the pool at the same time     Contact Info:             Appointments: [facility]         All sessions are 45 minutes   [place] [address]            Please call the [facility] if   [place], [place]  [phone]           you need to cancel or reschedule an appointment.  [place], PT    Dennison Nancy, OTR/L    05/07/20  Access Code: [email or web address] URL: [email or web address] Date: 01/08/2021 Prepared by: Willow Ora  Exercises Sit to/from Stand in Stride position - 1 x daily - 5 x weekly - 1 sets - 10 reps Heel Toe Raises with Counter Support - 1 x daily - 5 x weekly - 1 sets - 10 reps Romberg Stance with Eyes Closed - 1 x daily - 5 x weekly - 1 sets - 3 reps - 30 hold Wide Stance with Eyes Closed and Head Nods - 1 x daily - 5 x weekly - 1 sets - 10 reps

## 2021-01-08 NOTE — Progress Notes (Signed)
Subjective:    Patient ID: Howard White, male    DOB: Mar 21, 1961, 60 y.o.   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  He has completed the inpatient stroke rehabilitation program at [facility] and was discharged home on 10/29/2020.  He is living with his parents who assist him with ADLs.  The patient fatigues in the afternoons and takes a nap.  Still has problems with dragging LLE at times. The patient just started outpatient therapy and has had both PT and OT evaluations.  He will be starting with some aquatic exercise as well next week. He had a minor fall on carpeting when trying to get up once but denies any injuries. Pain Inventory Average Pain 3 Pain Right Now 3 My pain is other  In the last 24 hours, has pain interfered with the following? General activity 9 Relation with others 9 Enjoyment of life 9 What TIME of day is your pain at its worst? morning  Sleep (in general) Fair  Pain is worse with: walking Pain improves with: other Relief from Meds: no pain med  Family History  Problem Relation Age of Onset  . Brain cancer Other   . Thyroid disease Other   . Cancer Other   . Diabetes Other   . Stroke Other    Social History   Socioeconomic History  . Marital status: Divorced    Spouse name: Not on file  . Number of children: 0  . Years of education: Masters  . Highest education level: Not on file  Occupational History  . Occupation: Disability  Tobacco Use  . Smoking status: Never Smoker  . Smokeless tobacco: Never Used  Substance and Sexual Activity  . Alcohol use: Yes    Alcohol/week: 0.0 standard drinks    Comment: 2 beers a month  . Drug use: No  . Sexual activity: Not on file  Other Topics Concern  . Not on file  Social History Narrative    Patient lives at home with his parents.   Patient is on long term disability.    Patient is divorced.    Patient has no children.    Patient has his Master's in Business.    Patient is right-handed.   Social Determinants of Health   Financial Resource Strain: Not on file  Food Insecurity: Not on file  Transportation Needs: Not on file  Physical Activity: Not on file  Stress: Not on file  Social Connections: Not on file   Past Surgical History:  Procedure Laterality Date  . brain cancer    . BRAIN SURGERY     Past Surgical History:  Procedure Laterality Date  . brain cancer    . BRAIN SURGERY     Past Medical History:  Diagnosis Date  . Brain cancer ([place])   . Seizures ([place])   . Thyroid disease    Hypothyroid   BP 107/71   Pulse 67   Temp 98.2 F (36.8 C)   Ht 5\' 10"  (1.778 m)   Wt 184 lb (83.5 kg) Comment: reported by pt  SpO2 95%   BMI 26.40 kg/m   Opioid Risk Score:   Fall Risk Score:  `1  Depression screen PHQ 2/9  Depression screen [facility] 2/9 01/08/2021 11/27/2020  Decreased Interest 2 2  Down,  Depressed, Hopeless 0 0  PHQ - 2 Score 2 2  Altered sleeping - 1  Tired, decreased energy - 1  Change in appetite - 0  Feeling bad or failure about yourself  - 0  Trouble concentrating - 0  Moving slowly or fidgety/restless - 0  Suicidal thoughts - 0  PHQ-9 Score - 4  Difficult doing work/chores - Not difficult at all   Review of Systems  Constitutional: Negative.   HENT: Negative.   Eyes: Negative.   Respiratory: Negative.   Cardiovascular: Negative.   Gastrointestinal: Negative.   Genitourinary: Negative.   Musculoskeletal: Positive for gait problem.  Skin: Negative.   Allergic/Immunologic: Negative.   Neurological: Positive for weakness.  Psychiatric/Behavioral: Positive for dysphoric mood. The patient is nervous/anxious.   All other systems reviewed and are negative.      Objective:   Physical Exam Constitutional:      Appearance: He is normal  weight.  Eyes:     Extraocular Movements: Extraocular movements intact.     Pupils: Pupils are equal, round, and reactive to light.  Neurological:     Mental Status: He is alert and oriented to person, place, and time.  Psychiatric:        Mood and Affect: Mood normal.   Motor strength is 4/5 in the left deltoid, bicep, tricep, grip, hip flexor, knee extensor, ankle dorsiflexor plantar flexor Right side is 5/5 in the same muscle groups. Patient has a mild left neglect noted during ambulation he did hit his shoulder on the door jam.  The patient initially was able to clear his ankle but after ambulating 30 feet he started dragging his foot slightly.  He could dorsiflex his ankle on command however and clear his foot. His left elbow is in a flexed position during gait. Tone MAS 2 at the left elbow flexors and wrist flexor MAS 2 at the plantar flexor  Difficulty in performing finger to thumb opposition in the left upper extremity.      Assessment & Plan:  #1.  Left spastic hemiplegia secondary to right subcortical infarct in a patient with history of left astrocytoma. Overall he is making very good progress in terms of his left upper extremity strength.  Motor control remains the issue.  Fine motor is also diminished  Continue outpatient PT OT I will see the patient back in 2 months

## 2021-01-09 NOTE — Therapy (Signed)
[facility] [address] [place], [place], [address] Phone: [phone]   Fax:  [phone]  Physical Therapy Treatment  Patient Details  Name: Howard White MRN: [id number] Date of Birth: Jun 21, 1961 Referring Provider (PT): Letta Pate Luanna Salk, MD   Encounter Date: 01/08/2021   PT End of Session - 01/08/21 1630    Visit Number 2    Number of Visits 13    Date for PT Re-Evaluation 02/27/21    Authorization Type HTA    Progress Note Due on Visit 10    PT Start Time 1232    PT Stop Time 1315    PT Time Calculation (min) 43 min    Equipment Utilized During Treatment Gait belt    Activity Tolerance Patient tolerated treatment well    Behavior During Therapy Impulsive;WFL for tasks assessed/performed           Past Medical History:  Diagnosis Date  . Brain cancer ([place])   . Seizures ([place])   . Thyroid disease    Hypothyroid    Past Surgical History:  Procedure Laterality Date  . brain cancer    . BRAIN SURGERY      There were no vitals filed for this visit.   Subjective Assessment - 01/08/21 1235    Subjective No new complaints. No falls. Had some back pain this morning, improved with use of Tylenol.    Patient is accompained by: Family member   mom and dad   Pertinent History Epilepsy, malignant neoplasm of brain, astrocytoma brain tumor, thyroid disease, brain surgery    Limitations Standing;Walking    Patient Stated Goals Work on his balance and be able to walk by himself.    Currently in Pain? No/denies                 [facility] Adult PT Treatment/Exercise - 01/08/21 1249      Transfers   Transfers Sit to Stand;Stand to Sit    Sit to Stand 4: Min guard;With upper extremity assist;Without upper extremity assist;From bed;From chair/3-in-1    Sit to Stand Details (indicate cue type and reason) cues to slow down and to ensure good foot placement prior to standing up    Stand to Sit 4: Min guard;4:  Min assist;With upper extremity assist;Without upper extremity assist;To bed;To chair/3-in-1    Stand to Sit Details cues to reach back after getting all the way to the surface, to slow down and to use UE's to controll descent with sitting down      Self-Care   Self-Care Other Self-Care Comments    Other Self-Care Comments  Discussed aquatics with pt and parents as pt will be starting next week with OT in the pool. Schedule set today. Handout provided and reviewed with pt/parents and all questions addressed.      Exercises   Exercises Other Exercises    Other Exercises  issued HEP for strengthening and balance. Refer to [place] for full details. No issues noted or reported with performance in session today. Min guard assist for safety with ex's in session today.          Issued the following to HEP today:   Access Code: [email or web address] URL: [email or web address] Date: 01/08/2021 Prepared by: Willow Ora  Exercises Sit to/from Stand in Stride position - 1 x daily - 5 x weekly - 1 sets - 10 reps Heel Toe Raises with Counter Support - 1 x daily - 5 x weekly - 1 sets - 10  reps Romberg Stance with Eyes Closed - 1 x daily - 5 x weekly - 1 sets - 3 reps - 30 hold Wide Stance with Eyes Closed and Head Nods - 1 x daily - 5 x weekly - 1 sets - 10 reps       PT Education - 01/08/21 1700    Education Details aquatic therapy information;initial HEP    Person(s) Educated Patient;Parent(s)    Methods Explanation;Demonstration;Tactile cues;Verbal cues;Handout    Comprehension Verbalized understanding;Returned demonstration;Verbal cues required;Tactile cues required;Need further instruction            PT Short Term Goals - 12/29/20 1757      PT SHORT TERM GOAL #1   Title Pt will demonstrate compliance with initial HEP with supervision of parents    Time 4    Period Weeks    Status New    Target Date 01/28/21      PT SHORT TERM GOAL #2   Title Pt will demonstrate improved  safety with transfers and will decrease time to perform five time sit to stand to 18 seconds with min A    Baseline 22 seconds with mod A due to impulsivity    Time 4    Period Weeks    Status New    Target Date 01/28/21      PT SHORT TERM GOAL #3   Title Pt will improve BERG to >/= 30/56 to indicate decreased falls risk    Baseline 26/56    Time 4    Period Weeks    Status New    Target Date 01/28/21      PT SHORT TERM GOAL #4   Title Pt will demonstrate decreased falls risk when ambulating with cane as indicated by increase in DGI to >/= 11/24    Baseline 7/24    Time 4    Period Weeks    Status New    Target Date 01/28/21             PT Long Term Goals - 12/29/20 1759      PT LONG TERM GOAL #1   Title Pt will demonstrate ability to perform final HEP and walking program with supervision    Time 8    Period Weeks    Status New    Target Date 02/27/21      PT LONG TERM GOAL #2   Title Pt will report improvement in LE function on FOTO to >/= 66%    Baseline 54%    Time 8    Period Weeks    Status New    Target Date 02/27/21      PT LONG TERM GOAL #3   Title Pt will decrease five time sit to stand to </= 15 seconds with supervision    Time 8    Period Weeks    Status New    Target Date 02/27/21      PT LONG TERM GOAL #4   Title Pt will increase BERG to >/= 40/56    Time 8    Period Weeks    Status New    Target Date 02/27/21      PT LONG TERM GOAL #5   Title Pt will increase DGI to >/= 18/24    Time 8    Period Weeks    Status New    Target Date 02/27/21      PT LONG TERM GOAL #6   Title Pt will demonstrate ability to safely  stand from the floor MOD I with use of UE    Time 8    Period Weeks    Status New    Target Date 02/27/21      PT LONG TERM GOAL #7   Title Pt will ambulate x 500' outside with LRAD and supervision and will negotiate 12 stairs with R rail (ascending) with supervision to be able to access upstairs bedroom    Time 8     Period Weeks    Status New    Target Date 02/27/21                 Plan - 01/08/21 1630    Clinical Impression Statement Today's skilled session focused on getting pt established for aquatic therapy and addressing any questions/concerns pt/parents had. Remainder of session focused on establishment of an HEP for strengthening and balance. No issues noted or reported with session. The pt is progressing and should benefit from continued PT to progress toward unmet goals.    Personal Factors and Comorbidities Comorbidity 3+;Past/Current Experience;Social Background    Comorbidities Epilepsy, malignant neoplasm of brain, astrocytoma brain tumor, thyroid disease, brain surgery, lives with elderly parents    Examination-Activity Limitations [place];Locomotion Level;Stairs;Transfers;Bathing;Toileting    Examination-Participation Restrictions Community Activity;Meal Prep;Shop    Stability/Clinical Decision Making Evolving/Moderate complexity    Rehab Potential Good    PT Frequency 2x / week    PT Duration 8 weeks    PT Treatment/Interventions ADLs/Self Care Home Management;Aquatic Therapy;DME Instruction;Gait training;Cognitive remediation;Stair training;Patient/family education;Functional mobility training;Orthotic Fit/Training;Therapeutic activities;Therapeutic exercise;Balance training;Neuromuscular re-education;Passive range of motion;Vestibular;Visual/perceptual remediation/compensation    PT Next Visit Plan review HEP from last session and add to  HEP focusing on posture, controlled sit <> stand, standing balance/coordination as indicated.  NMR/WB focusing on attention to L, balance when turning, reorientation to midline.  Gait training/safety with cane; stair negotiation.  When I Letta Moynahan) see [place] again I would like to do a vestibular assessment.    PT Home Exercise Plan Access Code: [email or web address]    Consulted and Agree with Plan of Care Patient;Family member/caregiver    Family Member Consulted  parents           Patient will benefit from skilled therapeutic intervention in order to improve the following deficits and impairments:  Abnormal gait,Decreased balance,Decreased cognition,Decreased coordination,Difficulty walking,Impaired tone,Impaired UE functional use,Impaired vision/preception,Postural dysfunction,Pain  Visit Diagnosis: Unsteadiness on feet  Other symptoms and signs involving the nervous system  Repeated falls  Other abnormalities of gait and mobility  Muscle weakness (generalized)  Hemiplegia and hemiparesis following other cerebrovascular disease affecting left non-dominant side [facility])     Problem List Patient Active Problem List   Diagnosis Date Noted  . Slow transit constipation   . Sleep disturbance   . Basal ganglia stroke ([place])   . Incontinence of feces   . Leukocytosis   . Seizures ([place])   . Spastic hemiparesis ([place])   . Cerebrovascular accident (CVA) of right basal ganglia ([place]) 09/30/2020  . CVA (cerebral vascular accident) ([place]) 09/26/2020  . Gait disorder 10/12/2016  . Memory loss 10/12/2016  . Epilepsy ([place]) 03/30/2013  . Malignant neoplasm of brain ([place]) 03/30/2013  . Astrocytoma brain tumor [facility]) 03/27/2012    Willow Ora, PTA, [employer] [address], [place], [place] [address] [phone] 01/09/21, 1:15 PM   Name: Jerod Mcquain MRN: [id number] Date of Birth: Mar 26, 1961

## 2021-01-12 ENCOUNTER — Ambulatory Visit: Payer: PPO | Admitting: Occupational Therapy

## 2021-01-12 DIAGNOSIS — I69854 Hemiplegia and hemiparesis following other cerebrovascular disease affecting left non-dominant side: Secondary | ICD-10-CM | POA: Diagnosis not present

## 2021-01-13 ENCOUNTER — Encounter: Payer: Self-pay | Admitting: Occupational Therapy

## 2021-01-13 ENCOUNTER — Encounter: Payer: Self-pay | Admitting: Physical Therapy

## 2021-01-13 ENCOUNTER — Other Ambulatory Visit: Payer: Self-pay

## 2021-01-13 ENCOUNTER — Ambulatory Visit: Payer: PPO | Attending: Physical Medicine & Rehabilitation | Admitting: Physical Therapy

## 2021-01-13 ENCOUNTER — Ambulatory Visit: Payer: PPO | Admitting: Occupational Therapy

## 2021-01-13 DIAGNOSIS — R2689 Other abnormalities of gait and mobility: Secondary | ICD-10-CM | POA: Diagnosis not present

## 2021-01-13 DIAGNOSIS — R2681 Unsteadiness on feet: Secondary | ICD-10-CM | POA: Diagnosis not present

## 2021-01-13 DIAGNOSIS — M25612 Stiffness of left shoulder, not elsewhere classified: Secondary | ICD-10-CM | POA: Insufficient documentation

## 2021-01-13 DIAGNOSIS — R296 Repeated falls: Secondary | ICD-10-CM | POA: Diagnosis not present

## 2021-01-13 DIAGNOSIS — M6281 Muscle weakness (generalized): Secondary | ICD-10-CM | POA: Diagnosis not present

## 2021-01-13 DIAGNOSIS — R41842 Visuospatial deficit: Secondary | ICD-10-CM | POA: Diagnosis not present

## 2021-01-13 DIAGNOSIS — R41840 Attention and concentration deficit: Secondary | ICD-10-CM

## 2021-01-13 DIAGNOSIS — I69854 Hemiplegia and hemiparesis following other cerebrovascular disease affecting left non-dominant side: Secondary | ICD-10-CM

## 2021-01-13 DIAGNOSIS — R278 Other lack of coordination: Secondary | ICD-10-CM | POA: Diagnosis not present

## 2021-01-13 DIAGNOSIS — R29818 Other symptoms and signs involving the nervous system: Secondary | ICD-10-CM | POA: Insufficient documentation

## 2021-01-13 NOTE — Therapy (Signed)
[facility] [address] [place], [place], [address] Phone: [phone]   Fax:  [phone]  Occupational Therapy Treatment  Patient Details  Name: Howard White MRN: [id number] Date of Birth: [birth year] Referring Provider (OT): Alysia Penna, MD   Encounter Date: 01/13/2021   OT End of Session - 01/13/21 1318    Visit Number 4    Number of Visits 25    Date for OT Re-Evaluation 03/23/21    Authorization Type [employer]    Authorization Time Period $15 copay for OT, VL:MN    OT Start Time 1316    OT Stop Time 1355    OT Time Calculation (min) 39 min    Activity Tolerance Patient tolerated treatment well    Behavior During Therapy [facility] for tasks assessed/performed           Past Medical History:  Diagnosis Date  . Brain cancer ([place])   . Seizures ([place])   . Thyroid disease    Hypothyroid    Past Surgical History:  Procedure Laterality Date  . brain cancer    . BRAIN SURGERY      There were no vitals filed for this visit.   Subjective Assessment - 01/13/21 1319    Subjective  Pt denies any changes or pain.    Patient is accompanied by: Family member   Parents   Pertinent History Brain Tumor 2001. Hx of Seizures    Limitations Seizures. Fall Risk    Patient Stated Goals Walk again and use a cane. "foot, hand and balance"    Currently in Pain? No/denies                        OT Treatments/Exercises (OP) - 01/13/21 1404      Neurological Re-education Exercises   Other Exercises 1 supine cane exercises x 10 - shoulder flexion, chest press, horiz abduction, circumduction. Pt reqd min facilitation for uncoordinated movements. External Rotation stretch (butterfly)    Other Exercises 2 reaching patterns with LUE for low level with connect 4 chips in frame and with stacking cones. Mod facilitation and assistance at elbow for uncoordinated movements and weakness. Pt with mod to max  facilitation for decreasing abduction of LUE for compensatory movements                    OT Short Term Goals - 01/13/21 0619      OT SHORT TERM GOAL #1   Title Pt will be independent with initial HEP 01/26/2021    Time 4    Period Weeks    Status On-going    Target Date 01/26/21      OT SHORT TERM GOAL #2   Title Pt will improve wrist extension in LUE to 35 degrees for improving functional use of LUE.    Baseline LUE 30 passive - unable to maintain position actively.    Time 4    Period Weeks    Status On-going      OT SHORT TERM GOAL #3   Title Pt will improve attention, balance and range of motion in order to complete toilet hygiene with no physical assistance.    Time 4    Period Weeks    Status On-going      OT SHORT TERM GOAL #4   Title Pt will perform home management and/or simple meal prep with supervision and good safety awareness    Time 4  Period Weeks    Status On-going      OT SHORT TERM GOAL #5   Title Pt will perform all basic ADLs without physical assistance.    Time 4    Period Weeks    Status On-going      OT SHORT TERM GOAL #6   Title Pt will improve functional use of LUE by scoring 25 or greater on Box and Blocks with LUE.    Baseline RUE 41, LUE 18    Time 4    Period Weeks    Status On-going             OT Long Term Goals - 01/13/21 2025      OT LONG TERM GOAL #1   Title Pt will be independent with updated HEP    Time 12    Period Weeks    Status On-going      OT LONG TERM GOAL #2   Title Pt will complete environmental scanning with 95% accuracy.    Time 12    Period Weeks    Status On-going      OT LONG TERM GOAL #3   Title Pt will attend to a novel cognitive task for 15 minutes or greater in a moderately distracting environment with no cues for redirection.    Time 12    Period Weeks    Status On-going      OT LONG TERM GOAL #4   Title Pt will achieve wrist extension of 45 degrees or greater in LUE    Baseline  30*    Time 12    Period Weeks    Status On-going      OT LONG TERM GOAL #5   Title Pt will improve grip strength in LUE to 30 lbs or greater for increase in ability to complete clothing management and other functional tasks with LUE.    Baseline LUE 18 RUE 41    Time 12    Period Weeks    Status On-going      OT LONG TERM GOAL #6   Title Pt will improve fine motor coordination in LUE by completing 9 hole peg test in 2 minutes or less    Baseline 2 pegs in 2 minutes    Time 12    Period Weeks    Status On-going      OT LONG TERM GOAL #7   Title Pt will complete FOTO And score 66% or greater.    Baseline 54%    Time 12    Period Weeks    Status On-going                 Plan - 01/13/21 1412    Clinical Impression Statement Pt with poor scapula stability noted in L this day and compensation of LUE for weakness. Continue to work on better posture and working towards increasing functional use of LUE    OT Occupational Profile and History Detailed Assessment- Review of Records and additional review of physical, cognitive, psychosocial history related to current functional performance    Occupational performance deficits (Please refer to evaluation for details): ADL's    Body Structure / Function / Physical Skills ADL;ROM;UE functional use;FMC;Decreased knowledge of use of DME;Balance;Vision;Dexterity;GMC;Coordination;IADL;Proprioception;Tone;Strength;Pain;Endurance    Cognitive Skills Attention;Safety Awareness;Sequencing;Orientation;Memory;Understand;Problem Solve    Rehab Potential Good    Clinical Decision Making Several treatment options, min-mod task modification necessary    Comorbidities Affecting Occupational Performance: May have comorbidities impacting occupational performance  Modification or Assistance to Complete Evaluation  No modification of tasks or assist necessary to complete eval    OT Frequency 2x / week    OT Duration 12 weeks   24 visits + eval   OT  Treatment/Interventions Self-care/ADL training;Therapeutic exercise;Visual/perceptual remediation/compensation;Patient/family education;Splinting;Neuromuscular education;Moist Heat;Aquatic Therapy;DME and/or AE instruction;Passive range of motion;Therapist, nutritional;Therapeutic activities;Energy conservation;Electrical Stimulation;Manual Therapy    Plan Review initial HEP and add/update prn, aquatics for postural realignment and activation, balance, gross motor coordiantion, and LUE fuctional ROM.    Recommended Other Services consider ST?    Consulted and Agree with Plan of Care Patient;Family member/caregiver   mom and dad   Family Member Consulted Mom and Dad           Patient will benefit from skilled therapeutic intervention in order to improve the following deficits and impairments:   Body Structure / Function / Physical Skills: ADL,ROM,UE functional use,FMC,Decreased knowledge of use of DME,Balance,Vision,Dexterity,GMC,Coordination,IADL,Proprioception,Tone,Strength,Pain,Endurance Cognitive Skills: Attention,Safety Awareness,Sequencing,Orientation,Memory,Understand,Problem Solve     Visit Diagnosis: Unsteadiness on feet  Attention and concentration deficit  Stiffness of left shoulder, not elsewhere classified  Hemiplegia and hemiparesis following other cerebrovascular disease affecting left non-dominant side (HCC)  Other lack of coordination  Visuospatial deficit  Muscle weakness (generalized)    Problem List Patient Active Problem List   Diagnosis Date Noted  . Slow transit constipation   . Sleep disturbance   . Basal ganglia stroke ([place])   . Incontinence of feces   . Leukocytosis   . Seizures ([place])   . Spastic hemiparesis ([place])   . Cerebrovascular accident (CVA) of right basal ganglia ([place]) 09/30/2020  . CVA (cerebral vascular accident) ([place]) 09/26/2020  . Gait disorder 10/12/2016  . Memory loss 10/12/2016  . Epilepsy ([place]) 03/30/2013  . Malignant  neoplasm of brain ([place]) 03/30/2013  . Astrocytoma brain tumor [facility]) 03/27/2012    Zachery Conch MOT, OTR/L  01/13/2021, 2:15 PM  [facility] [address] [place], [place], [address] Phone: [phone]   Fax:  [phone]  Name: Howard White MRN: [id number] Date of Birth: 12-Nov-1961

## 2021-01-13 NOTE — Therapy (Signed)
[facility] [address] [place], [place], [address] Phone: [phone]   Fax:  [phone]  Occupational Therapy Treatment  Patient Details  Name: Howard White MRN: [id number] Date of Birth: 01-25-1961 Referring Provider (OT): Alysia Penna, MD   Encounter Date: 01/12/2021   OT End of Session - 01/13/21 0614    Visit Number 3    Number of Visits 25    Date for OT Re-Evaluation 03/23/21    Authorization Type [employer]    Authorization Time Period $15 copay for OT, VL:MN    OT Start Time 1325    OT Stop Time 1415    OT Time Calculation (min) 50 min    Activity Tolerance Patient tolerated treatment well    Behavior During Therapy [facility] for tasks assessed/performed           Past Medical History:  Diagnosis Date  . Brain cancer ([place])   . Seizures ([place])   . Thyroid disease    Hypothyroid    Past Surgical History:  Procedure Laterality Date  . brain cancer    . BRAIN SURGERY      There were no vitals filed for this visit.   Subjective Assessment - 01/13/21 0611    Subjective  Balance- I need to work n my balance    Patient is accompanied by: Family member   Parents   Pertinent History Brain Tumor 2001. Hx of Seizures    Limitations Seizures. Fall Risk    Patient Stated Goals Walk again and use a cane. "foot, hand and balance"    Currently in Pain? No/denies          Patient seen for initial aquatic therapy visit.  Parents present for session, and helped patient dress/undress.  Patient entered pool at steps, walking without device and min assist.  Patient assisted to use left hand on hand rail as he descended steps into pool.  Patient with significant posterior rotation of left trunk, and left lateral trunk shortening.  Patient had consistent difficulty with weight shift of upper trunk and head toward left, although responded well in quiet environment to positional cueing initially physical,  then visual.  Patient's treatment occurred in 3-4.5 feet of water.  Water was helpful to provide support, and to provide resistance and challenge.  Patient followed verbal and tactile cues well, but had difficulty grading movement - at times well overshot, or producing ballistic type step, which then caused loss of balance.  Patient very responsive to facilitation, and focus of today's session was to reduce width of base of support, and forward weight shift over feet in stepping.  Also worked to assess/address volitional control of LUE at shoulder, forearm, and wrist.                        OT Education - 01/13/21 0612    Education Details Patient's first aquatics treatment.  Instructed patient on general safety, importance of slow rate of movement, postural control, weight shift in standing, and with functional mobility    Person(s) Educated Patient;Parent(s)    Methods Explanation;Demonstration;Tactile cues;Verbal cues    Comprehension Need further instruction;Verbalized understanding            OT Short Term Goals - 01/13/21 0619      OT SHORT TERM GOAL #1   Title Pt will be independent with initial HEP 01/26/2021    Time 4    Period Weeks    Status  On-going    Target Date 01/26/21      OT SHORT TERM GOAL #2   Title Pt will improve wrist extension in LUE to 35 degrees for improving functional use of LUE.    Baseline LUE 30 passive - unable to maintain position actively.    Time 4    Period Weeks    Status On-going      OT SHORT TERM GOAL #3   Title Pt will improve attention, balance and range of motion in order to complete toilet hygiene with no physical assistance.    Time 4    Period Weeks    Status On-going      OT SHORT TERM GOAL #4   Title Pt will perform home management and/or simple meal prep with supervision and good safety awareness    Time 4    Period Weeks    Status On-going      OT SHORT TERM GOAL #5   Title Pt will perform all basic ADLs  without physical assistance.    Time 4    Period Weeks    Status On-going      OT SHORT TERM GOAL #6   Title Pt will improve functional use of LUE by scoring 25 or greater on Box and Blocks with LUE.    Baseline RUE 41, LUE 18    Time 4    Period Weeks    Status On-going             OT Long Term Goals - 01/13/21 2633      OT LONG TERM GOAL #1   Title Pt will be independent with updated HEP    Time 12    Period Weeks    Status On-going      OT LONG TERM GOAL #2   Title Pt will complete environmental scanning with 95% accuracy.    Time 12    Period Weeks    Status On-going      OT LONG TERM GOAL #3   Title Pt will attend to a novel cognitive task for 15 minutes or greater in a moderately distracting environment with no cues for redirection.    Time 12    Period Weeks    Status On-going      OT LONG TERM GOAL #4   Title Pt will achieve wrist extension of 45 degrees or greater in LUE    Baseline 30*    Time 12    Period Weeks    Status On-going      OT LONG TERM GOAL #5   Title Pt will improve grip strength in LUE to 30 lbs or greater for increase in ability to complete clothing management and other functional tasks with LUE.    Baseline LUE 18 RUE 41    Time 12    Period Weeks    Status On-going      OT LONG TERM GOAL #6   Title Pt will improve fine motor coordination in LUE by completing 9 hole peg test in 2 minutes or less    Baseline 2 pegs in 2 minutes    Time 12    Period Weeks    Status On-going      OT LONG TERM GOAL #7   Title Pt will complete FOTO And score 66% or greater.    Baseline 54%    Time 12    Period Weeks    Status On-going  Plan - 01/13/21 3818    Clinical Impression Statement Patient with significant balance issues when up on feet.  Patient with maladaptive postural control, which over time could lead to secondary musculoskeletal problems.  Patient did well in aquatic session.  Water providing both support and  resistance.  Patient can benefit from treatment in water to assist with safe weight shifts forward over feet for safe functional mobility overall.    OT Occupational Profile and History Detailed Assessment- Review of Records and additional review of physical, cognitive, psychosocial history related to current functional performance    Occupational performance deficits (Please refer to evaluation for details): ADL's    Body Structure / Function / Physical Skills ADL;ROM;UE functional use;FMC;Decreased knowledge of use of DME;Balance;Vision;Dexterity;GMC;Coordination;IADL;Proprioception;Tone;Strength;Pain;Endurance    Cognitive Skills Attention;Safety Awareness;Sequencing;Orientation;Memory;Understand;Problem Solve    Rehab Potential Good    Clinical Decision Making Several treatment options, min-mod task modification necessary    Comorbidities Affecting Occupational Performance: May have comorbidities impacting occupational performance    Modification or Assistance to Complete Evaluation  No modification of tasks or assist necessary to complete eval    OT Frequency 2x / week    OT Duration 12 weeks   24 visits + eval   OT Treatment/Interventions Self-care/ADL training;Therapeutic exercise;Visual/perceptual remediation/compensation;Patient/family education;Splinting;Neuromuscular education;Moist Heat;Aquatic Therapy;DME and/or AE instruction;Passive range of motion;Therapist, nutritional;Therapeutic activities;Energy conservation;Electrical Stimulation;Manual Therapy    Plan Review initial HEP and add/update prn, aquatics for postural realignment and activation, balance, gross motor coordiantion, and LUE fuctional ROM.    Recommended Other Services consider ST?    Consulted and Agree with Plan of Care Patient;Family member/caregiver   mom and dad   Family Member Consulted Mom and Dad           Patient will benefit from skilled therapeutic intervention in order to improve the following  deficits and impairments:   Body Structure / Function / Physical Skills: ADL,ROM,UE functional use,FMC,Decreased knowledge of use of DME,Balance,Vision,Dexterity,GMC,Coordination,IADL,Proprioception,Tone,Strength,Pain,Endurance Cognitive Skills: Attention,Safety Awareness,Sequencing,Orientation,Memory,Understand,Problem Solve     Visit Diagnosis: Unsteadiness on feet  Other symptoms and signs involving the nervous system  Muscle weakness (generalized)  Hemiplegia and hemiparesis following other cerebrovascular disease affecting left non-dominant side (HCC)  Other lack of coordination  Visuospatial deficit  Attention and concentration deficit  Stiffness of left shoulder, not elsewhere classified    Problem List Patient Active Problem List   Diagnosis Date Noted  . Slow transit constipation   . Sleep disturbance   . Basal ganglia stroke ([place])   . Incontinence of feces   . Leukocytosis   . Seizures ([place])   . Spastic hemiparesis ([place])   . Cerebrovascular accident (CVA) of right basal ganglia ([place]) 09/30/2020  . CVA (cerebral vascular accident) ([place]) 09/26/2020  . Gait disorder 10/12/2016  . Memory loss 10/12/2016  . Epilepsy ([place]) 03/30/2013  . Malignant neoplasm of brain ([place]) 03/30/2013  . Astrocytoma brain tumor [facility]) 03/27/2012    Mariah Milling, OTR/L 01/13/2021, 6:21 AM  [facility] [address] [place], [place], [address] Phone: [phone]   Fax:  [phone]  Name: Dantonio Justen MRN: [id number] Date of Birth: [birth year]

## 2021-01-14 NOTE — Therapy (Signed)
[facility] [address] [place], [place], [address] Phone: [phone]   Fax:  [phone]  Physical Therapy Treatment  Patient Details  Name: [place] MRN: [id number] Date of Birth: 1961-07-27 Referring Provider (PT): Letta Pate Luanna Salk, MD   Encounter Date: 01/13/2021   PT End of Session - 01/13/21 1239    Visit Number 3    Number of Visits 13    Date for PT Re-Evaluation 02/27/21    Authorization Type HTA    Progress Note Due on Visit 10    PT Start Time 1232    PT Stop Time 1315    PT Time Calculation (min) 43 min    Equipment Utilized During Treatment Gait belt    Activity Tolerance Patient tolerated treatment well    Behavior During Therapy Impulsive;WFL for tasks assessed/performed           Past Medical History:  Diagnosis Date  . Brain cancer ([place])   . Seizures ([name])   . Thyroid disease    Hypothyroid    Past Surgical History:  Procedure Laterality Date  . brain cancer    . BRAIN SURGERY      There were no vitals filed for this visit.   Subjective Assessment - 01/13/21 1238    Subjective No new complaints. No falls or pain to report.    Patient is accompained by: Family member   mom   Pertinent History Epilepsy, malignant neoplasm of brain, astrocytoma brain tumor, thyroid disease, brain surgery    Patient Stated Goals Work on his balance and be able to walk by himself.    Currently in Pain? No/denies                [facility] Adult PT Treatment/Exercise - 01/13/21 1254      Transfers   Transfers Sit to Stand;Stand to Sit    Sit to Stand 4: Min guard;With upper extremity assist;Without upper extremity assist;From bed;From chair/3-in-1    Sit to Stand Details (indicate cue type and reason) cues to slow down, ensure proper foot placement and for weight shifitng with standing up    Stand to Sit 4: Min guard;4: Min assist;With upper extremity assist;Without upper extremity assist;To  bed;To chair/3-in-1    Stand to Sit Details cues to turn fully to surface and reach back with sitting for safety as pt attempted to sit too early x2 with session needing assist to sit safely on surface vs floor      Ambulation/Gait   Ambulation/Gait Yes    Ambulation/Gait Assistance 4: Min assist;3: Mod assist    Ambulation/Gait Assistance Details with Hurycane. cues on posture, wider base of support, sequencing with cane and cane placement. increased assistance needed with gait back to mat from parallel bars after balance activities were performed.    Ambulation Distance (Feet) 20 Feet   x2   Assistive device Straight cane    Gait Pattern Step-to pattern;Decreased arm swing - left;Decreased step length - left;Decreased stance time - left;Decreased stride length;Decreased hip/knee flexion - left;Decreased dorsiflexion - left;Lateral trunk lean to right;Trunk flexed;Poor foot clearance - left    Ambulation Surface Level;Indoor               Balance Exercises - 01/13/21 1254      Balance Exercises: Standing   Standing Eyes Opened Wide (BOA);Head turns;Foam/compliant surface;Other reps (comment);Limitations    Standing Eyes Opened Limitations on airex with light UE support on bars for head movements left<>right, up<>down  for ~10 reps. min guard to min assist for balance. assist needed to stabilize pt's left ankle as it tended to roll out.    Standing Eyes Closed Wide (BOA);Foam/compliant surface;2 reps;30 secs;Limitations    Standing Eyes Closed Limitations on airex with light UE support- 30 sec's holds for 3 reps. min guard to min assist for balance.    SLS with Vectors Solid surface;Upper extremity assist 1;Other reps (comment);Limitations    SLS with Vectors Limitations on floor with foam bubbles on floor: with cane support alternating forward foot taps with min guard to min assist for balance.    Rockerboard Anterior/posterior;Lateral;EO;30 seconds;Other reps (comment);Intermittent UE  support;Limitations    Rockerboard Limitations both ways on the balance board- rocking with light UE support progressing to no UE support with EO, then holding the board steady for 30 sec's x 3 reps with EO. min assist with cues on posture/weight shiting to assist with balance.               PT Short Term Goals - 12/29/20 1757      PT SHORT TERM GOAL #1   Title Pt will demonstrate compliance with initial HEP with supervision of parents    Time 4    Period Weeks    Status New    Target Date 01/28/21      PT SHORT TERM GOAL #2   Title Pt will demonstrate improved safety with transfers and will decrease time to perform five time sit to stand to 18 seconds with min A    Baseline 22 seconds with mod A due to impulsivity    Time 4    Period Weeks    Status New    Target Date 01/28/21      PT SHORT TERM GOAL #3   Title Pt will improve BERG to >/= 30/56 to indicate decreased falls risk    Baseline 26/56    Time 4    Period Weeks    Status New    Target Date 01/28/21      PT SHORT TERM GOAL #4   Title Pt will demonstrate decreased falls risk when ambulating with cane as indicated by increase in DGI to >/= 11/24    Baseline 7/24    Time 4    Period Weeks    Status New    Target Date 01/28/21             PT Long Term Goals - 12/29/20 1759      PT LONG TERM GOAL #1   Title Pt will demonstrate ability to perform final HEP and walking program with supervision    Time 8    Period Weeks    Status New    Target Date 02/27/21      PT LONG TERM GOAL #2   Title Pt will report improvement in LE function on FOTO to >/= 66%    Baseline 54%    Time 8    Period Weeks    Status New    Target Date 02/27/21      PT LONG TERM GOAL #3   Title Pt will decrease five time sit to stand to </= 15 seconds with supervision    Time 8    Period Weeks    Status New    Target Date 02/27/21      PT LONG TERM GOAL #4   Title Pt will increase BERG to >/= 40/56    Time 8    Period Weeks  Status New    Target Date 02/27/21      PT LONG TERM GOAL #5   Title Pt will increase DGI to >/= 18/24    Time 8    Period Weeks    Status New    Target Date 02/27/21      PT LONG TERM GOAL #6   Title Pt will demonstrate ability to safely stand from the floor MOD I with use of UE    Time 8    Period Weeks    Status New    Target Date 02/27/21      PT LONG TERM GOAL #7   Title Pt will ambulate x 500' outside with LRAD and supervision and will negotiate 12 stairs with R rail (ascending) with supervision to be able to access upstairs bedroom    Time 8    Period Weeks    Status New    Target Date 02/27/21                 Plan - 01/13/21 1239    Clinical Impression Statement Today's skilled session continued to focus on transfers, gait with hurrycane and began to address balance traning. Pt continues to need cues to slow down and general safety at times due to impulsivity. No issues noted or reported in session. The pt is progressing toward goals and should benefit from continued PT to progress toward unmet goals.    Personal Factors and Comorbidities Comorbidity 3+;Past/Current Experience;Social Background    Comorbidities Epilepsy, malignant neoplasm of brain, astrocytoma brain tumor, thyroid disease, brain surgery, lives with elderly parents    Examination-Activity Limitations [place];Locomotion Level;Stairs;Transfers;Bathing;Toileting    Examination-Participation Restrictions Community Activity;Meal Prep;Shop    Stability/Clinical Decision Making Evolving/Moderate complexity    Rehab Potential Good    PT Frequency 2x / week    PT Duration 8 weeks    PT Treatment/Interventions ADLs/Self Care Home Management;Aquatic Therapy;DME Instruction;Gait training;Cognitive remediation;Stair training;Patient/family education;Functional mobility training;Orthotic Fit/Training;Therapeutic activities;Therapeutic exercise;Balance training;Neuromuscular re-education;Passive range of  motion;Vestibular;Visual/perceptual remediation/compensation    PT Next Visit Plan continued to work on controlled sit <> stand, standing balance/coordination as indicated.  NMR/WB focusing on attention to L, balance when turning, reorientation to midline.  Gait training/safety with cane; stair negotiation.  When I Letta Moynahan) see [facility] again I would like to do a vestibular assessment.    PT Home Exercise Plan Access Code: [email or web address]    Consulted and Agree with Plan of Care Patient;Family member/caregiver    Family Member Consulted parents           Patient will benefit from skilled therapeutic intervention in order to improve the following deficits and impairments:  Abnormal gait,Decreased balance,Decreased cognition,Decreased coordination,Difficulty walking,Impaired tone,Impaired UE functional use,Impaired vision/preception,Postural dysfunction,Pain  Visit Diagnosis: Unsteadiness on feet  Other symptoms and signs involving the nervous system  Muscle weakness (generalized)  Hemiplegia and hemiparesis following other cerebrovascular disease affecting left non-dominant side (HCC)  Repeated falls  Other abnormalities of gait and mobility     Problem List Patient Active Problem List   Diagnosis Date Noted  . Slow transit constipation   . Sleep disturbance   . Basal ganglia stroke ([place])   . Incontinence of feces   . Leukocytosis   . Seizures ([place])   . Spastic hemiparesis ([place])   . Cerebrovascular accident (CVA) of right basal ganglia ([place]) 09/30/2020  . CVA (cerebral vascular accident) ([place]) 09/26/2020  . Gait disorder 10/12/2016  . Memory loss 10/12/2016  . Epilepsy ([place]) 03/30/2013  .  Malignant neoplasm of brain ([place]) 03/30/2013  . Astrocytoma brain tumor [facility]) 03/27/2012    Willow Ora, PTA, [facility] [address] [place], [place] [address] [phone] 01/14/21, 3:11 PM   Name: Simuel Stebner MRN: [id number] Date of Birth: March 11, 1961

## 2021-01-15 ENCOUNTER — Encounter: Payer: Self-pay | Admitting: Physical Therapy

## 2021-01-15 ENCOUNTER — Other Ambulatory Visit: Payer: Self-pay

## 2021-01-15 ENCOUNTER — Ambulatory Visit: Payer: PPO | Admitting: Occupational Therapy

## 2021-01-15 ENCOUNTER — Ambulatory Visit: Payer: PPO | Admitting: Physical Therapy

## 2021-01-15 DIAGNOSIS — R2681 Unsteadiness on feet: Secondary | ICD-10-CM

## 2021-01-15 DIAGNOSIS — R296 Repeated falls: Secondary | ICD-10-CM

## 2021-01-15 DIAGNOSIS — R2689 Other abnormalities of gait and mobility: Secondary | ICD-10-CM

## 2021-01-15 DIAGNOSIS — I69854 Hemiplegia and hemiparesis following other cerebrovascular disease affecting left non-dominant side: Secondary | ICD-10-CM

## 2021-01-15 DIAGNOSIS — M6281 Muscle weakness (generalized): Secondary | ICD-10-CM

## 2021-01-15 DIAGNOSIS — R29818 Other symptoms and signs involving the nervous system: Secondary | ICD-10-CM

## 2021-01-16 NOTE — Therapy (Signed)
[facility] [address] [place], [place], [address] Phone: [phone]   Fax:  [phone]  Physical Therapy Treatment  Patient Details  Name: [place] MRN: [id number] Date of Birth: Feb 23, 1961 Referring Provider (PT): Letta Pate Luanna Salk, MD   Encounter Date: 01/15/2021   PT End of Session - 01/15/21 1320    Visit Number 4    Number of Visits 13    Date for PT Re-Evaluation 02/27/21    Authorization Type HTA    Progress Note Due on Visit 10    PT Start Time 7858    PT Stop Time 1400    PT Time Calculation (min) 43 min    Equipment Utilized During Treatment Gait belt    Activity Tolerance Patient tolerated treatment well    Behavior During Therapy Impulsive;WFL for tasks assessed/performed           Past Medical History:  Diagnosis Date  . Brain cancer ([place])   . Seizures ([place])   . Thyroid disease    Hypothyroid    Past Surgical History:  Procedure Laterality Date  . brain cancer    . BRAIN SURGERY      There were no vitals filed for this visit.   Subjective Assessment - 01/15/21 1320    Subjective No new complaints. No falls or pain to report.    Patient is accompained by: Family member   mom   Pertinent History Epilepsy, malignant neoplasm of brain, astrocytoma brain tumor, thyroid disease, brain surgery    Patient Stated Goals Work on his balance and be able to walk by himself.    Currently in Pain? No/denies                 [facility] Adult PT Treatment/Exercise - 01/15/21 1321      Transfers   Transfers Sit to Stand;Stand to [employer] Transfers    Sit to Stand 4: Min guard;With upper extremity assist;Without upper extremity assist;From bed;From chair/3-in-1    Stand to Sit 4: Min guard;4: Min assist;With upper extremity assist;Without upper extremity assist;To bed;To chair/3-in-1    Stand Pivot Transfers 4: Min assist    Stand Pivot Transfer Details (indicate cue type and  reason) wheelchair<>mat no AD      Ambulation/Gait   Ambulation/Gait Yes    Ambulation/Gait Assistance 4: Min assist;3: Mod assist    Ambulation/Gait Assistance Details started with use of cane, then to just PTA assist for last ~40 feet- pt's right hand on PTA shoulder with assit/faciliation at pelvis for weight shifting. pt continues to veer with unsteady gait pattern with anterior weight loss at times needing assist to correct.    Ambulation Distance (Feet) 115 Feet   x1   Assistive device Straight cane   hurrycane   Gait Pattern Step-to pattern;Decreased arm swing - left;Decreased step length - left;Decreased stance time - left;Decreased stride length;Decreased hip/knee flexion - left;Decreased dorsiflexion - left;Lateral trunk lean to right;Trunk flexed;Poor foot clearance - left    Ambulation Surface Level;Indoor      Self-Care   Self-Care Other Self-Care Comments    Other Self-Care Comments  reports he walked by himself at home today. Discussed with pt and mom that this is not safe as the pt is still unsteady and at risk for falls. both verbalized understanding.      Neuro Re-ed    Neuro Re-ed Details  for balance/muscle re-ed: 4 square stepping working on posture, step length and weight shifting with min  to mod assist; 2 color discs on floor- alternating fwd/bwd stepping the discs with cues to slow down, for stance position and weight shifting, min to mod assist with increased assistance needed as pt fatigued. Then with left foot only fwd>lateral>bwd foot taps to the circles in right stance to address coordination and balance with cues needed to task/sequencing throughout and on weight shifting. min to mod assist for balance.      Exercises   Exercises Other Exercises    Other Exercises  right LE strengthening: seated at edge of mat- with green band hamstring curls/heel slides with foot on pillow case for 2 sets of 10 reps; with green band assisted DF, resisted PF for 2 sets of 10 reps.  assist needed to keep heel on floor with exercises; green band resistance for long arc quads for 2 sets of 10 reps, cues for full range. cues with all ex's for slow and controlled movements.              PT Education - 01/16/21 0951    Education Details safety at home by not walking by himself at this time due to fall risk.    Person(s) Educated Patient;Parent(s)   mom   Methods Explanation;Verbal cues    Comprehension Verbalized understanding;Need further instruction            PT Short Term Goals - 12/29/20 1757      PT SHORT TERM GOAL #1   Title Pt will demonstrate compliance with initial HEP with supervision of parents    Time 4    Period Weeks    Status New    Target Date 01/28/21      PT SHORT TERM GOAL #2   Title Pt will demonstrate improved safety with transfers and will decrease time to perform five time sit to stand to 18 seconds with min A    Baseline 22 seconds with mod A due to impulsivity    Time 4    Period Weeks    Status New    Target Date 01/28/21      PT SHORT TERM GOAL #3   Title Pt will improve BERG to >/= 30/56 to indicate decreased falls risk    Baseline 26/56    Time 4    Period Weeks    Status New    Target Date 01/28/21      PT SHORT TERM GOAL #4   Title Pt will demonstrate decreased falls risk when ambulating with cane as indicated by increase in DGI to >/= 11/24    Baseline 7/24    Time 4    Period Weeks    Status New    Target Date 01/28/21             PT Long Term Goals - 12/29/20 1759      PT LONG TERM GOAL #1   Title Pt will demonstrate ability to perform final HEP and walking program with supervision    Time 8    Period Weeks    Status New    Target Date 02/27/21      PT LONG TERM GOAL #2   Title Pt will report improvement in LE function on FOTO to >/= 66%    Baseline 54%    Time 8    Period Weeks    Status New    Target Date 02/27/21      PT LONG TERM GOAL #3   Title Pt will decrease five time sit to  stand to  </= 15 seconds with supervision    Time 8    Period Weeks    Status New    Target Date 02/27/21      PT LONG TERM GOAL #4   Title Pt will increase BERG to >/= 40/56    Time 8    Period Weeks    Status New    Target Date 02/27/21      PT LONG TERM GOAL #5   Title Pt will increase DGI to >/= 18/24    Time 8    Period Weeks    Status New    Target Date 02/27/21      PT LONG TERM GOAL #6   Title Pt will demonstrate ability to safely stand from the floor MOD I with use of UE    Time 8    Period Weeks    Status New    Target Date 02/27/21      PT LONG TERM GOAL #7   Title Pt will ambulate x 500' outside with LRAD and supervision and will negotiate 12 stairs with R rail (ascending) with supervision to be able to access upstairs bedroom    Time 8    Period Weeks    Status New    Target Date 02/27/21                 Plan - 01/15/21 1321    Clinical Impression Statement Today's skilled session continued to focus on gait with cane, strengthening and balance/coordination with continued need for cues to slow down and to focus on task as pt tends to be both internally and externally distracted. The pt is making steady progress with improved sit<>stands and stand pivot transfers toward end of session with less assistance/cues needed. The pt should benefit from continued PT to progress toward unmet goals.    Personal Factors and Comorbidities Comorbidity 3+;Past/Current Experience;Social Background    Comorbidities Epilepsy, malignant neoplasm of brain, astrocytoma brain tumor, thyroid disease, brain surgery, lives with elderly parents    Examination-Activity Limitations [place];Locomotion Level;Stairs;Transfers;Bathing;Toileting    Examination-Participation Restrictions Community Activity;Meal Prep;Shop    Stability/Clinical Decision Making Evolving/Moderate complexity    Rehab Potential Good    PT Frequency 2x / week    PT Duration 8 weeks    PT Treatment/Interventions ADLs/Self  Care Home Management;Aquatic Therapy;DME Instruction;Gait training;Cognitive remediation;Stair training;Patient/family education;Functional mobility training;Orthotic Fit/Training;Therapeutic activities;Therapeutic exercise;Balance training;Neuromuscular re-education;Passive range of motion;Vestibular;Visual/perceptual remediation/compensation    PT Next Visit Plan continued to work on controlled sit <> stand, standing balance/coordination as indicated.  NMR/WB focusing on attention to L, balance when turning, reorientation to midline.  Gait training/safety with cane; stair negotiation.  When I Letta Moynahan) see [place] again I would like to do a vestibular assessment.    PT Home Exercise Plan Access Code: [email or web address]    Consulted and Agree with Plan of Care Patient;Family member/caregiver    Family Member Consulted parents           Patient will benefit from skilled therapeutic intervention in order to improve the following deficits and impairments:  Abnormal gait,Decreased balance,Decreased cognition,Decreased coordination,Difficulty walking,Impaired tone,Impaired UE functional use,Impaired vision/preception,Postural dysfunction,Pain  Visit Diagnosis: Unsteadiness on feet  Hemiplegia and hemiparesis following other cerebrovascular disease affecting left non-dominant side (HCC)  Muscle weakness (generalized)  Other symptoms and signs involving the nervous system  Repeated falls  Other abnormalities of gait and mobility     Problem List Patient Active Problem List   Diagnosis Date Noted  .  Slow transit constipation   . Sleep disturbance   . Basal ganglia stroke ([place])   . Incontinence of feces   . Leukocytosis   . Seizures ([place])   . Spastic hemiparesis ([place])   . Cerebrovascular accident (CVA) of right basal ganglia ([place]) 09/30/2020  . CVA (cerebral vascular accident) ([place]) 09/26/2020  . Gait disorder 10/12/2016  . Memory loss 10/12/2016  . Epilepsy ([place]) 03/30/2013  . Malignant neoplasm  of brain ([place]) 03/30/2013  . Astrocytoma brain tumor [facility]) 03/27/2012    Willow Ora, PTA, [facility] [address], [place], [place] [address] [phone] 01/16/21, 9:52 AM   Name: [place] MRN: [id number] Date of Birth: 1961-11-01

## 2021-01-19 ENCOUNTER — Encounter: Payer: Self-pay | Admitting: Occupational Therapy

## 2021-01-19 ENCOUNTER — Ambulatory Visit: Payer: PPO | Admitting: Occupational Therapy

## 2021-01-19 DIAGNOSIS — R2681 Unsteadiness on feet: Secondary | ICD-10-CM | POA: Diagnosis not present

## 2021-01-19 NOTE — Therapy (Signed)
[facility] [address] [place], [place], [address] Phone: [phone]   Fax:  [phone]  Occupational Therapy Treatment  Patient Details  Name: Howard White MRN: [id number] Date of Birth: 11-12-1961 Referring Provider (OT): Alysia Penna, MD   Encounter Date: 01/19/2021   OT End of Session - 01/19/21 1736    Visit Number 5    Number of Visits 25    Date for OT Re-Evaluation 03/23/21    Authorization Type [employer]    Authorization Time Period $15 copay for OT, VL:MN    OT Start Time 1325    OT Stop Time 1415    OT Time Calculation (min) 50 min           Past Medical History:  Diagnosis Date  . Brain cancer ([place])   . Seizures ([place])   . Thyroid disease    Hypothyroid    Past Surgical History:  Procedure Laterality Date  . brain cancer    . BRAIN SURGERY      There were no vitals filed for this visit.   Subjective Assessment - 01/19/21 1735    Subjective  I have been dragging my left foot a little less I think    Patient is accompanied by: Family member    Pertinent History Brain Tumor 2001. Hx of Seizures    Limitations Seizures. Fall Risk    Currently in Pain? No/denies          Patient seen for aquatic therapy visit.  Patient entered and exited the pool via stairs.  Min assist at entrance, and mod assist to exit as working on step through pattern with left hand on hand rail.   Session took place in 3.5-4 ft of water.  Patient with fewer losses of balance today - more attentive to instruction, and more familiar with water routine, water properties.  Patient wore prism glasses this session, which seemed helpful to manage balance.  Worked on alignment of trunk and postural set for forward and backward walking.   Patient consistently able to tip head toward left shoulder, which aided in shortening trunk on left - helping to balance his mass over his base of support.  In seated worked on  alignment and stretch to left shoulder girdle/upper quadrant then reaching forward and toward abduction - emphasis on hand first, extending elbow.  Completed modified plank position to load BUE, and followed with modifed plantigrade at handrails, to stretch shoulder girdle.                          OT Short Term Goals - 01/19/21 1738      OT SHORT TERM GOAL #1   Title Pt will be independent with initial HEP 01/26/2021    Time 4    Period Weeks    Status On-going    Target Date 01/26/21      OT SHORT TERM GOAL #2   Title Pt will improve wrist extension in LUE to 35 degrees for improving functional use of LUE.    Baseline LUE 30 passive - unable to maintain position actively.    Time 4    Period Weeks    Status On-going      OT SHORT TERM GOAL #3   Title Pt will improve attention, balance and range of motion in order to complete toilet hygiene with no physical assistance.    Time 4    Period Weeks  Status On-going      OT SHORT TERM GOAL #4   Title Pt will perform home management and/or simple meal prep with supervision and good safety awareness    Time 4    Period Weeks    Status On-going      OT SHORT TERM GOAL #5   Title Pt will perform all basic ADLs without physical assistance.    Time 4    Period Weeks    Status On-going      OT SHORT TERM GOAL #6   Title Pt will improve functional use of LUE by scoring 25 or greater on Box and Blocks with LUE.    Baseline RUE 41, LUE 18    Time 4    Period Weeks    Status On-going             OT Long Term Goals - 01/19/21 1738      OT LONG TERM GOAL #1   Title Pt will be independent with updated HEP    Time 12    Period Weeks    Status On-going      OT LONG TERM GOAL #2   Title Pt will complete environmental scanning with 95% accuracy.    Time 12    Period Weeks    Status On-going      OT LONG TERM GOAL #3   Title Pt will attend to a novel cognitive task for 15 minutes or greater in a  moderately distracting environment with no cues for redirection.    Time 12    Period Weeks    Status On-going      OT LONG TERM GOAL #4   Title Pt will achieve wrist extension of 45 degrees or greater in LUE    Baseline 30*    Time 12    Period Weeks    Status On-going      OT LONG TERM GOAL #5   Title Pt will improve grip strength in LUE to 30 lbs or greater for increase in ability to complete clothing management and other functional tasks with LUE.    Baseline LUE 18 RUE 41    Time 12    Period Weeks    Status On-going      OT LONG TERM GOAL #6   Title Pt will improve fine motor coordination in LUE by completing 9 hole peg test in 2 minutes or less    Baseline 2 pegs in 2 minutes    Time 12    Period Weeks    Status On-going      OT LONG TERM GOAL #7   Title Pt will complete FOTO And score 66% or greater.    Baseline 54%    Time 12    Period Weeks    Status On-going                 Plan - 01/19/21 1737    Clinical Impression Statement Patient with improving rate of speed with movement in pool helpful in improving safety and balance.  Working to improve postural control and weight shift onto left side in seated and standing conditions.    OT Occupational Profile and History Detailed Assessment- Review of Records and additional review of physical, cognitive, psychosocial history related to current functional performance    Occupational performance deficits (Please refer to evaluation for details): ADL's    Body Structure / Function / Physical Skills ADL;ROM;UE functional use;FMC;Decreased knowledge of use of DME;Balance;Vision;Dexterity;GMC;Coordination;IADL;Proprioception;Tone;Strength;Pain;Endurance  Cognitive Skills Attention;Safety Awareness;Sequencing;Orientation;Memory;Understand;Problem Solve    Rehab Potential Good    Clinical Decision Making Several treatment options, min-mod task modification necessary    Comorbidities Affecting Occupational Performance:  May have comorbidities impacting occupational performance    Modification or Assistance to Complete Evaluation  No modification of tasks or assist necessary to complete eval    OT Frequency 2x / week    OT Duration 12 weeks   24 visits + eval   OT Treatment/Interventions Self-care/ADL training;Therapeutic exercise;Visual/perceptual remediation/compensation;Patient/family education;Splinting;Neuromuscular education;Moist Heat;Aquatic Therapy;DME and/or AE instruction;Passive range of motion;Therapist, nutritional;Therapeutic activities;Energy conservation;Electrical Stimulation;Manual Therapy    Plan Review initial HEP and add/update prn, aquatics for postural realignment and activation, balance, gross motor coordiantion, and LUE fuctional ROM.    Recommended Other Services consider ST?    Consulted and Agree with Plan of Care Patient;Family member/caregiver   mom and dad   Family Member Consulted Mom and Dad           Patient will benefit from skilled therapeutic intervention in order to improve the following deficits and impairments:   Body Structure / Function / Physical Skills: ADL,ROM,UE functional use,FMC,Decreased knowledge of use of DME,Balance,Vision,Dexterity,GMC,Coordination,IADL,Proprioception,Tone,Strength,Pain,Endurance Cognitive Skills: Attention,Safety Awareness,Sequencing,Orientation,Memory,Understand,Problem Solve     Visit Diagnosis: Unsteadiness on feet  Hemiplegia and hemiparesis following other cerebrovascular disease affecting left non-dominant side (HCC)  Muscle weakness (generalized)  Attention and concentration deficit  Other symptoms and signs involving the nervous system  Stiffness of left shoulder, not elsewhere classified  Other lack of coordination    Problem List Patient Active Problem List   Diagnosis Date Noted  . Slow transit constipation   . Sleep disturbance   . Basal ganglia stroke ([place])   . Incontinence of feces   . Leukocytosis    . Seizures ([place])   . Spastic hemiparesis ([place])   . Cerebrovascular accident (CVA) of right basal ganglia ([place]) 09/30/2020  . CVA (cerebral vascular accident) ([place]) 09/26/2020  . Gait disorder 10/12/2016  . Memory loss 10/12/2016  . Epilepsy ([place]) 03/30/2013  . Malignant neoplasm of brain ([place]) 03/30/2013  . Astrocytoma brain tumor [facility]) 03/27/2012    Mariah Milling, OTR/L 01/19/2021, 5:39 PM  [facility] [address] [place], [place], [address] Phone: [phone]   Fax:  [phone]  Name: Howard White MRN: [id number] Date of Birth: December 28, 1960

## 2021-01-20 ENCOUNTER — Other Ambulatory Visit: Payer: Self-pay

## 2021-01-20 ENCOUNTER — Encounter: Payer: Self-pay | Admitting: Occupational Therapy

## 2021-01-20 ENCOUNTER — Ambulatory Visit: Payer: PPO | Admitting: Occupational Therapy

## 2021-01-20 ENCOUNTER — Encounter: Payer: Self-pay | Admitting: Physical Therapy

## 2021-01-20 ENCOUNTER — Ambulatory Visit: Payer: PPO | Admitting: Physical Therapy

## 2021-01-20 DIAGNOSIS — I69854 Hemiplegia and hemiparesis following other cerebrovascular disease affecting left non-dominant side: Secondary | ICD-10-CM

## 2021-01-20 DIAGNOSIS — M6281 Muscle weakness (generalized): Secondary | ICD-10-CM

## 2021-01-20 DIAGNOSIS — M25612 Stiffness of left shoulder, not elsewhere classified: Secondary | ICD-10-CM

## 2021-01-20 DIAGNOSIS — R29818 Other symptoms and signs involving the nervous system: Secondary | ICD-10-CM

## 2021-01-20 DIAGNOSIS — R278 Other lack of coordination: Secondary | ICD-10-CM

## 2021-01-20 DIAGNOSIS — R41840 Attention and concentration deficit: Secondary | ICD-10-CM

## 2021-01-20 DIAGNOSIS — R2689 Other abnormalities of gait and mobility: Secondary | ICD-10-CM

## 2021-01-20 DIAGNOSIS — R2681 Unsteadiness on feet: Secondary | ICD-10-CM | POA: Diagnosis not present

## 2021-01-20 DIAGNOSIS — R296 Repeated falls: Secondary | ICD-10-CM

## 2021-01-20 DIAGNOSIS — R41842 Visuospatial deficit: Secondary | ICD-10-CM

## 2021-01-20 NOTE — Therapy (Signed)
[facility] [address] [place], [place], [address] Phone: [phone]   Fax:  [phone]  Occupational Therapy Treatment  Patient Details  Name: Howard White MRN: [id number] Date of Birth: 08/31/1961 Referring Provider (OT): Alysia Penna, MD   Encounter Date: 01/20/2021   OT End of Session - 01/20/21 1446    Visit Number 6    Number of Visits 25    Date for OT Re-Evaluation 03/23/21    Authorization Time Period $15 copay for OT, VL:MN    OT Start Time 1150    OT Stop Time 1230    OT Time Calculation (min) 40 min    Activity Tolerance Patient tolerated treatment well    Behavior During Therapy [facility] for tasks assessed/performed           Past Medical History:  Diagnosis Date  . Brain cancer ([place])   . Seizures ([facility])   . Thyroid disease    Hypothyroid    Past Surgical History:  Procedure Laterality Date  . brain cancer    . BRAIN SURGERY      There were no vitals filed for this visit.   Subjective Assessment - 01/20/21 1152    Subjective  I was a little sore    Patient is accompanied by: Family member    Limitations Seizures. Fall Risk    Patient Stated Goals Walk again and use a cane. "foot, hand and balance"    Currently in Pain? No/denies                        OT Treatments/Exercises (OP) - 01/20/21 1435      Neurological Re-education Exercises   Other Exercises 1 Neuromuscular reeducation to address postural control and weight shift from lower trunk in sitting.  Worked to increase UE support in sitting on tabletop, then isolating more lower trunk initiated movements.  Patient initially resistive to active weight shift toward left but able to allow passive movement and then reproduce.  Worked on closed chain weight shifts with UE supported on table - anterior/posterior and laterally.  Patient needs frequent facilitation for thoracic extension - but this helps stabilize L scapula  on thorax. Worked on opening end of chain to address bilateral elbow flexionwith forearms neutral.  Worked then on lift off from table with decreased relaince on shoulder elevation and more on shoulder flexion,  Patient with excessive shoulder activation (IR/elevation) with effort, needs cueing - immediate and frequent and physical facilitation for more natural reach pattern                    OT Short Term Goals - 01/20/21 1447      OT SHORT TERM GOAL #1   Title Pt will be independent with initial HEP 01/26/2021    Time 4    Period Weeks    Status On-going    Target Date 01/26/21      OT SHORT TERM GOAL #2   Title Pt will improve wrist extension in LUE to 35 degrees for improving functional use of LUE.    Baseline LUE 30 passive - unable to maintain position actively.    Time 4    Period Weeks    Status On-going      OT SHORT TERM GOAL #3   Title Pt will improve attention, balance and range of motion in order to complete toilet hygiene with no physical assistance.    Time 4  Period Weeks    Status On-going      OT SHORT TERM GOAL #4   Title Pt will perform home management and/or simple meal prep with supervision and good safety awareness    Time 4    Period Weeks    Status On-going      OT SHORT TERM GOAL #5   Title Pt will perform all basic ADLs without physical assistance.    Time 4    Period Weeks    Status On-going      OT SHORT TERM GOAL #6   Title Pt will improve functional use of LUE by scoring 25 or greater on Box and Blocks with LUE.    Baseline RUE 41, LUE 18    Time 4    Period Weeks    Status On-going             OT Long Term Goals - 01/20/21 1448      OT LONG TERM GOAL #1   Title Pt will be independent with updated HEP    Time 12    Period Weeks    Status On-going      OT LONG TERM GOAL #2   Title Pt will complete environmental scanning with 95% accuracy.    Time 12    Period Weeks    Status On-going      OT LONG TERM GOAL #3    Title Pt will attend to a novel cognitive task for 15 minutes or greater in a moderately distracting environment with no cues for redirection.    Time 12    Period Weeks    Status On-going      OT LONG TERM GOAL #4   Title Pt will achieve wrist extension of 45 degrees or greater in LUE    Baseline 30*    Time 12    Period Weeks    Status On-going      OT LONG TERM GOAL #5   Title Pt will improve grip strength in LUE to 30 lbs or greater for increase in ability to complete clothing management and other functional tasks with LUE.    Baseline LUE 18 RUE 41    Time 12    Period Weeks    Status On-going      OT LONG TERM GOAL #6   Title Pt will improve fine motor coordination in LUE by completing 9 hole peg test in 2 minutes or less    Baseline 2 pegs in 2 minutes    Time 12    Period Weeks    Status On-going      OT LONG TERM GOAL #7   Title Pt will complete FOTO And score 66% or greater.    Baseline 54%    Time 12    Period Weeks    Status On-going                 Plan - 01/20/21 1446    Clinical Impression Statement Patient with improving attention to movement cues.  Working to improve postural control and weight shift onto left side and forward in seated and standing conditions.    OT Occupational Profile and History Detailed Assessment- Review of Records and additional review of physical, cognitive, psychosocial history related to current functional performance    Occupational performance deficits (Please refer to evaluation for details): ADL's    Body Structure / Function / Physical Skills ADL;ROM;UE functional use;FMC;Decreased knowledge of use of DME;Balance;Vision;Dexterity;GMC;Coordination;IADL;Proprioception;Tone;Strength;Pain;Endurance  Cognitive Skills Attention;Safety Awareness;Sequencing;Orientation;Memory;Understand;Problem Solve    Rehab Potential Good    Clinical Decision Making Several treatment options, min-mod task modification necessary     Comorbidities Affecting Occupational Performance: May have comorbidities impacting occupational performance    Modification or Assistance to Complete Evaluation  No modification of tasks or assist necessary to complete eval    OT Frequency 2x / week    OT Duration 12 weeks   24 visits + eval   OT Treatment/Interventions Self-care/ADL training;Therapeutic exercise;Visual/perceptual remediation/compensation;Patient/family education;Splinting;Neuromuscular education;Moist Heat;Aquatic Therapy;DME and/or AE instruction;Passive range of motion;Therapist, nutritional;Therapeutic activities;Energy conservation;Electrical Stimulation;Manual Therapy    Plan Review initial HEP and add/update prn, aquatics for postural realignment and activation, balance, gross motor coordiantion, and LUE fuctional ROM.    Recommended Other Services consider ST?    Consulted and Agree with Plan of Care Patient;Family member/caregiver   mom and dad   Family Member Consulted Mom and Dad           Patient will benefit from skilled therapeutic intervention in order to improve the following deficits and impairments:   Body Structure / Function / Physical Skills: ADL,ROM,UE functional use,FMC,Decreased knowledge of use of DME,Balance,Vision,Dexterity,GMC,Coordination,IADL,Proprioception,Tone,Strength,Pain,Endurance Cognitive Skills: Attention,Safety Awareness,Sequencing,Orientation,Memory,Understand,Problem Solve     Visit Diagnosis: Hemiplegia and hemiparesis following other cerebrovascular disease affecting left non-dominant side (HCC)  Visuospatial deficit  Stiffness of left shoulder, not elsewhere classified  Attention and concentration deficit  Muscle weakness (generalized)  Unsteadiness on feet  Other symptoms and signs involving the nervous system  Other lack of coordination    Problem List Patient Active Problem List   Diagnosis Date Noted  . Slow transit constipation   . Sleep disturbance    . Basal ganglia stroke ([place])   . Incontinence of feces   . Leukocytosis   . Seizures ([place])   . Spastic hemiparesis ([place])   . Cerebrovascular accident (CVA) of right basal ganglia ([place]) 09/30/2020  . CVA (cerebral vascular accident) ([place]) 09/26/2020  . Gait disorder 10/12/2016  . Memory loss 10/12/2016  . Epilepsy ([place]) 03/30/2013  . Malignant neoplasm of brain ([place]) 03/30/2013  . Astrocytoma brain tumor [facility]) 03/27/2012    Mariah Milling, OTR/L 01/20/2021, 2:49 PM  [facility] [address] [place], [place], [address] Phone: [phone]   Fax:  [phone]  Name: Howard White MRN: [id number] Date of Birth: 1961-07-03

## 2021-01-20 NOTE — Therapy (Signed)
[facility] [address] [place], [place], [address] Phone: [phone]   Fax:  [phone]  Physical Therapy Treatment  Patient Details  Name: Howard White MRN: [id number] Date of Birth: 1961/03/27 Referring Provider (PT): Charlett Blake, MD   Encounter Date: 01/20/2021   PT End of Session - 01/20/21 1218    Visit Number 5    Number of Visits 13    Date for PT Re-Evaluation 02/27/21    Authorization Type HTA - 10th visit PN    Progress Note Due on Visit 10    PT Start Time 1113    PT Stop Time 1151    PT Time Calculation (min) 38 min    Activity Tolerance Patient tolerated treatment well    Behavior During Therapy Impulsive;WFL for tasks assessed/performed           Past Medical History:  Diagnosis Date  . Brain cancer ([place])   . Seizures ([place])   . Thyroid disease    Hypothyroid    Past Surgical History:  Procedure Laterality Date  . brain cancer    . BRAIN SURGERY      There were no vitals filed for this visit.   Subjective Assessment - 01/20/21 1116    Subjective Has been working on exercises with mom and walking with father.  Pool is going well, helping with balance.    Patient is accompained by: Family member   mom   Pertinent History Epilepsy, malignant neoplasm of brain, astrocytoma brain tumor, thyroid disease, brain surgery    Patient Stated Goals Work on his balance and be able to walk by himself.    Currently in Pain? No/denies                   Vestibular Assessment - 01/20/21 1120      Vestibular Assessment   General Observation prism in glasses      Symptom Behavior   Type of Dizziness  Imbalance    Aggravating Factors Not applicable    Relieving Factors Not applicable    History of similar episodes Slight shift to the R, 10 deg measured by goniometer      Oculomotor Exam   Oculomotor Alignment Abnormal    Spontaneous Absent    Smooth Pursuits Intact     Saccades Poor trajectory      Oculomotor Exam-Fixation Suppressed    Left Head Impulse positive    Right Head Impulse positive      Vestibulo-Ocular Reflex   VOR to Slow Head Movement Positive right    VOR Cancellation Corrective saccades      Positional Sensitivities   Nose to Right Knee No dizziness    Right Knee to Sitting No dizziness    Nose to Left Knee No dizziness    Left Knee to Sitting No dizziness    Head Turning x 5 No dizziness   increased lean to R   Head Nodding x 5 No dizziness    Pivot Right in Standing No dizziness    Pivot Left in Standing No dizziness                    [facility] Adult PT Treatment/Exercise - 01/20/21 1204      Transfers   Transfers Sit to Stand;Stand to [employer] Transfers    Sit to Stand 4: Min guard    Stand to Sit 4: Min guard    Stand Pivot Transfers 4: Min guard  Stand Pivot Transfer Details (indicate cue type and reason) w/c <> Mat with UE support on mat, cues for controlled sit > stand, pivot and stand > sit      Neuro Re-ed    Neuro Re-ed Details  Seated on mat on green balance disc with bilat foot support on floor but yoga block between feet to prevent shifting one foot in for more support.  Attempted without foot support but pt unable to maintain upright posture and balance.  Seated on disc performed re-orientation to midline training with therapist facilitating head in midline while pt performed anterior/posterior pelvic tilts in midline with verbal and tactile cues from therapist.  Also performed lateral pelvic tilts with trunk elongation<>shortening, adding in reaching to L and R to facilitate greater weight shift and initiation at trunk.  For lateral weight shifting pt required max verbal and tactile cues from therapist to maintain head in midline, to maintain foot placement on floor and for sequencing.  When lifting LUE pt demonstrates immediate lateral lean to R.                  PT Education - 01/20/21  1217    Education Details change in orientation to midline    Person(s) Educated Patient;Parent(s)    Methods Explanation    Comprehension Verbalized understanding            PT Short Term Goals - 12/29/20 1757      PT SHORT TERM GOAL #1   Title Pt will demonstrate compliance with initial HEP with supervision of parents    Time 4    Period Weeks    Status New    Target Date 01/28/21      PT SHORT TERM GOAL #2   Title Pt will demonstrate improved safety with transfers and will decrease time to perform five time sit to stand to 18 seconds with min A    Baseline 22 seconds with mod A due to impulsivity    Time 4    Period Weeks    Status New    Target Date 01/28/21      PT SHORT TERM GOAL #3   Title Pt will improve BERG to >/= 30/56 to indicate decreased falls risk    Baseline 26/56    Time 4    Period Weeks    Status New    Target Date 01/28/21      PT SHORT TERM GOAL #4   Title Pt will demonstrate decreased falls risk when ambulating with cane as indicated by increase in DGI to >/= 11/24    Baseline 7/24    Time 4    Period Weeks    Status New    Target Date 01/28/21             PT Long Term Goals - 12/29/20 1759      PT LONG TERM GOAL #1   Title Pt will demonstrate ability to perform final HEP and walking program with supervision    Time 8    Period Weeks    Status New    Target Date 02/27/21      PT LONG TERM GOAL #2   Title Pt will report improvement in LE function on FOTO to >/= 66%    Baseline 54%    Time 8    Period Weeks    Status New    Target Date 02/27/21      PT LONG TERM GOAL #3   Title Pt  will decrease five time sit to stand to </= 15 seconds with supervision    Time 8    Period Weeks    Status New    Target Date 02/27/21      PT LONG TERM GOAL #4   Title Pt will increase BERG to >/= 40/56    Time 8    Period Weeks    Status New    Target Date 02/27/21      PT LONG TERM GOAL #5   Title Pt will increase DGI to >/= 18/24     Time 8    Period Weeks    Status New    Target Date 02/27/21      PT LONG TERM GOAL #6   Title Pt will demonstrate ability to safely stand from the floor MOD I with use of UE    Time 8    Period Weeks    Status New    Target Date 02/27/21      PT LONG TERM GOAL #7   Title Pt will ambulate x 500' outside with LRAD and supervision and will negotiate 12 stairs with R rail (ascending) with supervision to be able to access upstairs bedroom    Time 8    Period Weeks    Status New    Target Date 02/27/21                 Plan - 01/20/21 1219    Clinical Impression Statement Performed vestibular assessment to determine if pt is experiencing any vestibular dysfunction following CVA.  Pt does present with impaired VOR and impaired VOR cancellation as well as abnormal eye alignment (which may have been premorbid) but pt does not report any visual motion sensitivity or vertigo.  Pt does demonstrate a shift in orientation to midline and subjective visual vertical.  Continued to utilize NMR to focus on reorientation to midline, head and trunk righting using unstable surface.  Will continue to address in order to progress towards LTG.    Personal Factors and Comorbidities Comorbidity 3+;Past/Current Experience;Social Background    Comorbidities Epilepsy, malignant neoplasm of brain, astrocytoma brain tumor, thyroid disease, brain surgery, lives with elderly parents    Examination-Activity Limitations [place];Locomotion Level;Stairs;Transfers;Bathing;Toileting    Examination-Participation Restrictions Community Activity;Meal Prep;Shop    Stability/Clinical Decision Making Evolving/Moderate complexity    Rehab Potential Good    PT Frequency 2x / week    PT Duration 8 weeks    PT Treatment/Interventions ADLs/Self Care Home Management;Aquatic Therapy;DME Instruction;Gait training;Cognitive remediation;Stair training;Patient/family education;Functional mobility training;Orthotic  Fit/Training;Therapeutic activities;Therapeutic exercise;Balance training;Neuromuscular re-education;Passive range of motion;Vestibular;Visual/perceptual remediation/compensation    PT Next Visit Plan continued to work on controlled sit <> stand, standing balance/coordination as indicated.  NMR/WB focusing on attention to L, balance when turning, reorientation to midline/weight shifting/head and trunk righting.  Gait training/safety with cane; stair negotiation - work on weaning off of wheelchair.    PT Home Exercise Plan Access Code: [email or web address]    Consulted and Agree with Plan of Care Patient;Family member/caregiver    Family Member Consulted parents           Patient will benefit from skilled therapeutic intervention in order to improve the following deficits and impairments:  Abnormal gait,Decreased balance,Decreased cognition,Decreased coordination,Difficulty walking,Impaired tone,Impaired UE functional use,Impaired vision/preception,Postural dysfunction,Pain  Visit Diagnosis: Repeated falls  Other abnormalities of gait and mobility  Other symptoms and signs involving the nervous system  Other lack of coordination  Hemiplegia and hemiparesis following other cerebrovascular disease affecting  left non-dominant side [facility])     Problem List Patient Active Problem List   Diagnosis Date Noted  . Slow transit constipation   . Sleep disturbance   . Basal ganglia stroke ([place])   . Incontinence of feces   . Leukocytosis   . Seizures ([place])   . Spastic hemiparesis ([place])   . Cerebrovascular accident (CVA) of right basal ganglia ([place]) 09/30/2020  . CVA (cerebral vascular accident) ([place]) 09/26/2020  . Gait disorder 10/12/2016  . Memory loss 10/12/2016  . Epilepsy ([place]) 03/30/2013  . Malignant neoplasm of brain ([place]) 03/30/2013  . Astrocytoma brain tumor ([place]) 03/27/2012    Rico Junker, PT, DPT 01/20/21    12:28 PM    [facility] [address] [place], [place], [address] Phone: [phone]   Fax:  [phone]  Name: Sou Nohr MRN: [id number] Date of Birth: February 27, 1961

## 2021-01-22 ENCOUNTER — Ambulatory Visit: Payer: PPO | Admitting: Occupational Therapy

## 2021-01-22 ENCOUNTER — Encounter: Payer: Self-pay | Admitting: Physical Therapy

## 2021-01-22 ENCOUNTER — Ambulatory Visit: Payer: PPO | Admitting: Physical Therapy

## 2021-01-22 ENCOUNTER — Other Ambulatory Visit: Payer: Self-pay

## 2021-01-22 DIAGNOSIS — R296 Repeated falls: Secondary | ICD-10-CM

## 2021-01-22 DIAGNOSIS — R2689 Other abnormalities of gait and mobility: Secondary | ICD-10-CM

## 2021-01-22 DIAGNOSIS — R2681 Unsteadiness on feet: Secondary | ICD-10-CM | POA: Diagnosis not present

## 2021-01-22 DIAGNOSIS — M6281 Muscle weakness (generalized): Secondary | ICD-10-CM

## 2021-01-22 DIAGNOSIS — I69854 Hemiplegia and hemiparesis following other cerebrovascular disease affecting left non-dominant side: Secondary | ICD-10-CM

## 2021-01-22 DIAGNOSIS — R29818 Other symptoms and signs involving the nervous system: Secondary | ICD-10-CM

## 2021-01-23 NOTE — Therapy (Signed)
[facility] [address] [place], [place], [address] Phone: [phone]   Fax:  [phone]  Physical Therapy Treatment  Patient Details  Name: Howard White MRN: [id number] Date of Birth: 1961/07/16 Referring Provider (PT): Letta Pate Luanna Salk, MD   Encounter Date: 01/22/2021   PT End of Session - 01/22/21 1452    Visit Number 6    Number of Visits 13    Date for PT Re-Evaluation 02/27/21    Authorization Type HTA - 10th visit PN    Progress Note Due on Visit 10    PT Start Time 1448    PT Stop Time 1530    PT Time Calculation (min) 42 min    Equipment Utilized During Treatment Gait belt    Activity Tolerance Patient tolerated treatment well    Behavior During Therapy Impulsive;WFL for tasks assessed/performed           Past Medical History:  Diagnosis Date  . Brain cancer ([place])   . Seizures ([place])   . Thyroid disease    Hypothyroid    Past Surgical History:  Procedure Laterality Date  . brain cancer    . BRAIN SURGERY      There were no vitals filed for this visit.   Subjective Assessment - 01/22/21 1450    Subjective Had a fall in the shower yesterday. Turned too fast and fell when trying to get into the shower. Dad was with him "I freaked Dad out". Denies any injuries. Did not hit his head.    Patient is accompained by: Family member   mom   Pertinent History Epilepsy, malignant neoplasm of brain, astrocytoma brain tumor, thyroid disease, brain surgery    Limitations Standing;Walking    Patient Stated Goals Work on his balance and be able to walk by himself.    Currently in Pain? No/denies                [facility] Adult PT Treatment/Exercise - 01/22/21 1452      Transfers   Transfers Sit to Stand;Stand to [employer] Transfers    Sit to Stand 4: Min guard;With upper extremity assist;From bed;From chair/3-in-1    Stand to Sit 4: Min guard;With upper extremity assist;To bed;To chair/3-in-1     Stand Pivot Transfers 4: Min guard    Stand Pivot Transfer Details (indicate cue type and reason) w/c <> mat table      Neuro Re-ed    Neuro Re-ed Details  for strengthening/muscle re-ed: seated on green air disc- working to obtain midline with equal shoulder heights adn maintaing this posture with- alternating UE raises, progressing to bil UE raises (limited on left side only withing available range). pelvic rocking left<>right progressing to contralateral UE reaching for trunk elongation, back in neutral working on bil LE alteranting long arc quads and marching. cues needed to maintain midline posture, cervical retraction and erect trunk with all activities performed. min guard to min assist for balance with tactile cues/faciliation for posture/positioning;  standing with right HHA: forward/backward stepping over red foam beam progressing to stepping on color band targets on floor to work on coordination with stepping left>right, min to mod assist. cues to slow down and weight shift as well prior to stepping. Progressing to forward/backward stepping over yard stick with no UE support with cues/facilitaiton for tall posture, weight shifting and step length/height. cues to slow down as well.      Exercises   Exercises Other Exercises    Other  Exercises  bil LE strengthening: seated at edge of mat- with green band hamstring curls x 10 reps;  green band resistance for long arc quads x 10 reps, cues for full range. then marching with green band resistance for 10 reps  cues with all ex's for slow and controlled movements.                    PT Short Term Goals - 12/29/20 1757      PT SHORT TERM GOAL #1   Title Pt will demonstrate compliance with initial HEP with supervision of parents    Time 4    Period Weeks    Status New    Target Date 01/28/21      PT SHORT TERM GOAL #2   Title Pt will demonstrate improved safety with transfers and will decrease time to perform five time sit to  stand to 18 seconds with min A    Baseline 22 seconds with mod A due to impulsivity    Time 4    Period Weeks    Status New    Target Date 01/28/21      PT SHORT TERM GOAL #3   Title Pt will improve BERG to >/= 30/56 to indicate decreased falls risk    Baseline 26/56    Time 4    Period Weeks    Status New    Target Date 01/28/21      PT SHORT TERM GOAL #4   Title Pt will demonstrate decreased falls risk when ambulating with cane as indicated by increase in DGI to >/= 11/24    Baseline 7/24    Time 4    Period Weeks    Status New    Target Date 01/28/21             PT Long Term Goals - 12/29/20 1759      PT LONG TERM GOAL #1   Title Pt will demonstrate ability to perform final HEP and walking program with supervision    Time 8    Period Weeks    Status New    Target Date 02/27/21      PT LONG TERM GOAL #2   Title Pt will report improvement in LE function on FOTO to >/= 66%    Baseline 54%    Time 8    Period Weeks    Status New    Target Date 02/27/21      PT LONG TERM GOAL #3   Title Pt will decrease five time sit to stand to </= 15 seconds with supervision    Time 8    Period Weeks    Status New    Target Date 02/27/21      PT LONG TERM GOAL #4   Title Pt will increase BERG to >/= 40/56    Time 8    Period Weeks    Status New    Target Date 02/27/21      PT LONG TERM GOAL #5   Title Pt will increase DGI to >/= 18/24    Time 8    Period Weeks    Status New    Target Date 02/27/21      PT LONG TERM GOAL #6   Title Pt will demonstrate ability to safely stand from the floor MOD I with use of UE    Time 8    Period Weeks    Status New    Target Date 02/27/21  PT LONG TERM GOAL #7   Title Pt will ambulate x 500' outside with LRAD and supervision and will negotiate 12 stairs with R rail (ascending) with supervision to be able to access upstairs bedroom    Time 8    Period Weeks    Status New    Target Date 02/27/21                  Plan - 01/22/21 1452    Clinical Impression Statement Today's skilled session continued to focus on NMR for reorientation to midline with proper head/trunk positioning on a unstable surface and on standing stepping strategies/balance. Cues/facilitation needed through out to task, for posture/form and to slow down. The pt is progressing toward goals and should benefit from continued PT to progress toward goals.    Personal Factors and Comorbidities Comorbidity 3+;Past/Current Experience;Social Background    Comorbidities Epilepsy, malignant neoplasm of brain, astrocytoma brain tumor, thyroid disease, brain surgery, lives with elderly parents    Examination-Activity Limitations [place];Locomotion Level;Stairs;Transfers;Bathing;Toileting    Examination-Participation Restrictions Community Activity;Meal Prep;Shop    Stability/Clinical Decision Making Evolving/Moderate complexity    Rehab Potential Good    PT Frequency 2x / week    PT Duration 8 weeks    PT Treatment/Interventions ADLs/Self Care Home Management;Aquatic Therapy;DME Instruction;Gait training;Cognitive remediation;Stair training;Patient/family education;Functional mobility training;Orthotic Fit/Training;Therapeutic activities;Therapeutic exercise;Balance training;Neuromuscular re-education;Passive range of motion;Vestibular;Visual/perceptual remediation/compensation    PT Next Visit Plan continued to work on controlled sit <> stand, standing balance/coordination as indicated.  NMR/WB focusing on attention to L, balance when turning, reorientation to midline/weight shifting/head and trunk righting.  Gait training/safety with cane; stair negotiation - work on weaning off of wheelchair.    PT Home Exercise Plan Access Code: [email or web address]    Consulted and Agree with Plan of Care Patient;Family member/caregiver    Family Member Consulted parents           Patient will benefit from skilled therapeutic intervention in order to improve  the following deficits and impairments:  Abnormal gait,Decreased balance,Decreased cognition,Decreased coordination,Difficulty walking,Impaired tone,Impaired UE functional use,Impaired vision/preception,Postural dysfunction,Pain  Visit Diagnosis: Hemiplegia and hemiparesis following other cerebrovascular disease affecting left non-dominant side ([facility])  Muscle weakness (generalized)  Unsteadiness on feet  Other symptoms and signs involving the nervous system  Repeated falls  Other abnormalities of gait and mobility     Problem List Patient Active Problem List   Diagnosis Date Noted  . Slow transit constipation   . Sleep disturbance   . Basal ganglia stroke ([place])   . Incontinence of feces   . Leukocytosis   . Seizures ([place])   . Spastic hemiparesis ([place])   . Cerebrovascular accident (CVA) of right basal ganglia ([place]) 09/30/2020  . CVA (cerebral vascular accident) ([place]) 09/26/2020  . Gait disorder 10/12/2016  . Memory loss 10/12/2016  . Epilepsy ([place]) 03/30/2013  . Malignant neoplasm of brain ([place]) 03/30/2013  . Astrocytoma brain tumor [facility]) 03/27/2012    Willow Ora, PTA, [facility] [address] [place], [place] [address] [phone] 01/23/21, 11:54 AM   Name: Howard White MRN: [id number] Date of Birth: Nov 08, 1961

## 2021-01-26 ENCOUNTER — Encounter: Payer: Self-pay | Admitting: Occupational Therapy

## 2021-01-26 ENCOUNTER — Ambulatory Visit: Payer: PPO | Admitting: Occupational Therapy

## 2021-01-26 DIAGNOSIS — R2681 Unsteadiness on feet: Secondary | ICD-10-CM | POA: Diagnosis not present

## 2021-01-26 NOTE — Therapy (Signed)
[facility] [address] [place], [place], [address] Phone: [phone]   Fax:  [phone]  Occupational Therapy Treatment  Patient Details  Name: Howard White MRN: [id number] Date of Birth: 1961/09/14 Referring Provider (OT): Alysia Penna, MD   Encounter Date: 01/26/2021   OT End of Session - 01/26/21 1428    Visit Number 7    Number of Visits 25    Date for OT Re-Evaluation 03/23/21    Authorization Type [employer]    Authorization Time Period $15 copay for OT, VL:MN    OT Start Time 1330    OT Stop Time 1420    OT Time Calculation (min) 50 min    Activity Tolerance Patient tolerated treatment well    Behavior During Therapy [facility] for tasks assessed/performed           Past Medical History:  Diagnosis Date  . Brain cancer ([place])   . Seizures ([place])   . Thyroid disease    Hypothyroid    Past Surgical History:  Procedure Laterality Date  . brain cancer    . BRAIN SURGERY      There were no vitals filed for this visit.   Subjective Assessment - 01/26/21 1428    Subjective  I realize balance has to do with eyes, inner ear, and attention    Patient is accompanied by: Family member    Pertinent History Brain Tumor 2001. Hx of Seizures    Limitations Seizures. Fall Risk    Currently in Pain? No/denies           Patient seen for aquatic therapy visit.  Patient entered and exited pool via stairs and min assist.  Patient's treatment occurred in 3.5 -4.5 ft of water.  Initially worked on walking with emphasis on decreasing step width, increasing step length, increasing ability to shift forward over left foot, increased stance time on left foot, improved postural alignment for standing balance.   Patient able to slow pace today, and recognize initial stage of loss of balance and "stop/reset."  Patient using more proprioceptive and kinesthetic awareness with movement in water.   Needs continued  emphasis on head and trunk alignment, to better balance mass over base of support.  Patient repopnded well to facilitation of left arm into elbow extension at slight lateral flexion toward left  - able to take several 2-3 steps without assistance with LUE relaxed and hand open at side.                         OT Short Term Goals - 01/26/21 1430      OT SHORT TERM GOAL #1   Title Pt will be independent with initial HEP 01/26/2021    Time 4    Period Weeks    Status On-going    Target Date 01/26/21      OT SHORT TERM GOAL #2   Title Pt will improve wrist extension in LUE to 35 degrees for improving functional use of LUE.    Baseline LUE 30 passive - unable to maintain position actively.    Time 4    Period Weeks    Status On-going      OT SHORT TERM GOAL #3   Title Pt will improve attention, balance and range of motion in order to complete toilet hygiene with no physical assistance.    Time 4    Period Weeks    Status On-going  OT SHORT TERM GOAL #4   Title Pt will perform home management and/or simple meal prep with supervision and good safety awareness    Time 4    Period Weeks    Status On-going      OT SHORT TERM GOAL #5   Title Pt will perform all basic ADLs without physical assistance.    Time 4    Period Weeks    Status On-going      OT SHORT TERM GOAL #6   Title Pt will improve functional use of LUE by scoring 25 or greater on Box and Blocks with LUE.    Baseline RUE 41, LUE 18    Time 4    Period Weeks    Status On-going             OT Long Term Goals - 01/26/21 1430      OT LONG TERM GOAL #1   Title Pt will be independent with updated HEP    Time 12    Period Weeks    Status On-going      OT LONG TERM GOAL #2   Title Pt will complete environmental scanning with 95% accuracy.    Time 12    Period Weeks    Status On-going      OT LONG TERM GOAL #3   Title Pt will attend to a novel cognitive task for 15 minutes or greater in a  moderately distracting environment with no cues for redirection.    Time 12    Period Weeks    Status On-going      OT LONG TERM GOAL #4   Title Pt will achieve wrist extension of 45 degrees or greater in LUE    Baseline 30*    Time 12    Period Weeks    Status On-going      OT LONG TERM GOAL #5   Title Pt will improve grip strength in LUE to 30 lbs or greater for increase in ability to complete clothing management and other functional tasks with LUE.    Baseline LUE 18 RUE 41    Time 12    Period Weeks    Status On-going      OT LONG TERM GOAL #6   Title Pt will improve fine motor coordination in LUE by completing 9 hole peg test in 2 minutes or less    Baseline 2 pegs in 2 minutes    Time 12    Period Weeks    Status On-going      OT LONG TERM GOAL #7   Title Pt will complete FOTO And score 66% or greater.    Baseline 54%    Time 12    Period Weeks    Status On-going                 Plan - 01/26/21 1429    Clinical Impression Statement Patient is working to improve postural control and weight shift onto left side and forward in seated and standing conditions.    OT Occupational Profile and History Detailed Assessment- Review of Records and additional review of physical, cognitive, psychosocial history related to current functional performance    Occupational performance deficits (Please refer to evaluation for details): ADL's    Body Structure / Function / Physical Skills ADL;ROM;UE functional use;FMC;Decreased knowledge of use of DME;Balance;Vision;Dexterity;GMC;Coordination;IADL;Proprioception;Tone;Strength;Pain;Endurance    Cognitive Skills Attention;Safety Awareness;Sequencing;Orientation;Memory;Understand;Problem Solve    Rehab Potential Good    Clinical Decision Making Several  treatment options, min-mod task modification necessary    Comorbidities Affecting Occupational Performance: May have comorbidities impacting occupational performance    Modification or  Assistance to Complete Evaluation  No modification of tasks or assist necessary to complete eval    OT Frequency 2x / week    OT Duration 12 weeks   24 visits + eval   OT Treatment/Interventions Self-care/ADL training;Therapeutic exercise;Visual/perceptual remediation/compensation;Patient/family education;Splinting;Neuromuscular education;Moist Heat;Aquatic Therapy;DME and/or AE instruction;Passive range of motion;Therapist, nutritional;Therapeutic activities;Energy conservation;Electrical Stimulation;Manual Therapy    Plan Review initial HEP and add/update prn, aquatics for postural realignment and activation, balance, gross motor coordiantion, and LUE fuctional ROM.    Recommended Other Services consider ST?    Consulted and Agree with Plan of Care Patient;Family member/caregiver   mom and dad   Family Member Consulted Mom and Dad           Patient will benefit from skilled therapeutic intervention in order to improve the following deficits and impairments:   Body Structure / Function / Physical Skills: ADL,ROM,UE functional use,FMC,Decreased knowledge of use of DME,Balance,Vision,Dexterity,GMC,Coordination,IADL,Proprioception,Tone,Strength,Pain,Endurance Cognitive Skills: Attention,Safety Awareness,Sequencing,Orientation,Memory,Understand,Problem Solve     Visit Diagnosis: Unsteadiness on feet  Visuospatial deficit  Hemiplegia and hemiparesis following other cerebrovascular disease affecting left non-dominant side (HCC)  Muscle weakness (generalized)  Stiffness of left shoulder, not elsewhere classified  Attention and concentration deficit  Other lack of coordination    Problem List Patient Active Problem List   Diagnosis Date Noted  . Slow transit constipation   . Sleep disturbance   . Basal ganglia stroke ([place])   . Incontinence of feces   . Leukocytosis   . Seizures ([place])   . Spastic hemiparesis ([name])   . Cerebrovascular accident (CVA) of right basal ganglia  ([place]) 09/30/2020  . CVA (cerebral vascular accident) ([place]) 09/26/2020  . Gait disorder 10/12/2016  . Memory loss 10/12/2016  . Epilepsy ([place]) 03/30/2013  . Malignant neoplasm of brain ([place]) 03/30/2013  . 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 [place], [place], [address] Phone: [phone]   Fax:  [phone]  Name: Howard White MRN: [id number] Date of Birth: Mar 27, 1961

## 2021-01-27 ENCOUNTER — Ambulatory Visit: Payer: PPO | Admitting: Occupational Therapy

## 2021-01-27 ENCOUNTER — Ambulatory Visit: Payer: PPO | Admitting: Physical Therapy

## 2021-01-27 ENCOUNTER — Encounter: Payer: Self-pay | Admitting: Physical Therapy

## 2021-01-27 ENCOUNTER — Encounter: Payer: Self-pay | Admitting: Occupational Therapy

## 2021-01-27 ENCOUNTER — Other Ambulatory Visit: Payer: Self-pay

## 2021-01-27 DIAGNOSIS — R159 Full incontinence of feces: Secondary | ICD-10-CM | POA: Diagnosis not present

## 2021-01-27 DIAGNOSIS — R2681 Unsteadiness on feet: Secondary | ICD-10-CM

## 2021-01-27 DIAGNOSIS — R2689 Other abnormalities of gait and mobility: Secondary | ICD-10-CM

## 2021-01-27 DIAGNOSIS — R29818 Other symptoms and signs involving the nervous system: Secondary | ICD-10-CM

## 2021-01-27 DIAGNOSIS — M6281 Muscle weakness (generalized): Secondary | ICD-10-CM

## 2021-01-27 DIAGNOSIS — R296 Repeated falls: Secondary | ICD-10-CM

## 2021-01-27 DIAGNOSIS — M25612 Stiffness of left shoulder, not elsewhere classified: Secondary | ICD-10-CM

## 2021-01-27 DIAGNOSIS — I69854 Hemiplegia and hemiparesis following other cerebrovascular disease affecting left non-dominant side: Secondary | ICD-10-CM

## 2021-01-27 DIAGNOSIS — I6381 Other cerebral infarction due to occlusion or stenosis of small artery: Secondary | ICD-10-CM | POA: Diagnosis not present

## 2021-01-27 DIAGNOSIS — R278 Other lack of coordination: Secondary | ICD-10-CM

## 2021-01-27 NOTE — Therapy (Signed)
[facility] [address] [place], [place], [address] Phone: [phone]   Fax:  [phone]  Occupational Therapy Treatment  Patient Details  Name: Howard White MRN: [id number] Date of Birth: 07-May-1961 Referring Provider (OT): Alysia Penna, MD   Encounter Date: 01/27/2021   OT End of Session - 01/27/21 1322    Visit Number 8    Number of Visits 25    Date for OT Re-Evaluation 03/23/21    Authorization Type [employer]    Authorization Time Period $15 copay for OT, VL:MN    OT Start Time 1317    OT Stop Time 1400    OT Time Calculation (min) 43 min    Activity Tolerance Patient tolerated treatment well    Behavior During Therapy [facility] for tasks assessed/performed           Past Medical History:  Diagnosis Date  . Brain cancer ([place])   . Seizures ([place])   . Thyroid disease    Hypothyroid    Past Surgical History:  Procedure Laterality Date  . brain cancer    . BRAIN SURGERY      There were no vitals filed for this visit.   Subjective Assessment - 01/27/21 1321    Subjective  "kind of pain all over"    Patient is accompanied by: Family member    Pertinent History Brain Tumor 2001. Hx of Seizures    Limitations Seizures. Fall Risk    Currently in Pain? Yes    Pain Score 5     Pain Location Generalized    Pain Orientation Other (Comment)    Pain Descriptors / Indicators Sore    Pain Type Acute pain    Pain Onset Today    Pain Frequency Intermittent    Pain Relieving Factors took some pain meds             Treatment:  Neuromuscular Reeducation to target postural control and malpositioning with sitting edge of mat. Pt with extreme forward head and rounded shoulder posture. Pt receptive to facilitation of tactile and verbal cues. Manual therapy provided for facilitating upright seated posture and inhbiliting bicep tone for increasing elbow extension for forward reach. Pt engaged in  weight bearing in LUE on pebble while completing 10x diagonal reaches and 10x left hip/trunk dissociation.   LUE functional use with semi circle pegboard. Pt placed and removed white and red pegs with mod drops and difficulty. Pt required verbal and tactile cues for shoulder flexion and reach and with decreasing compensatory shoulder hiking on left.                      OT Short Term Goals - 01/26/21 1430      OT SHORT TERM GOAL #1   Title Pt will be independent with initial HEP 01/26/2021    Time 4    Period Weeks    Status On-going    Target Date 01/26/21      OT SHORT TERM GOAL #2   Title Pt will improve wrist extension in LUE to 35 degrees for improving functional use of LUE.    Baseline LUE 30 passive - unable to maintain position actively.    Time 4    Period Weeks    Status On-going      OT SHORT TERM GOAL #3   Title Pt will improve attention, balance and range of motion in order to complete toilet hygiene with no physical assistance.  Time 4    Period Weeks    Status On-going      OT SHORT TERM GOAL #4   Title Pt will perform home management and/or simple meal prep with supervision and good safety awareness    Time 4    Period Weeks    Status On-going      OT SHORT TERM GOAL #5   Title Pt will perform all basic ADLs without physical assistance.    Time 4    Period Weeks    Status On-going      OT SHORT TERM GOAL #6   Title Pt will improve functional use of LUE by scoring 25 or greater on Box and Blocks with LUE.    Baseline RUE 41, LUE 18    Time 4    Period Weeks    Status On-going             OT Long Term Goals - 01/26/21 1430      OT LONG TERM GOAL #1   Title Pt will be independent with updated HEP    Time 12    Period Weeks    Status On-going      OT LONG TERM GOAL #2   Title Pt will complete environmental scanning with 95% accuracy.    Time 12    Period Weeks    Status On-going      OT LONG TERM GOAL #3   Title Pt will  attend to a novel cognitive task for 15 minutes or greater in a moderately distracting environment with no cues for redirection.    Time 12    Period Weeks    Status On-going      OT LONG TERM GOAL #4   Title Pt will achieve wrist extension of 45 degrees or greater in LUE    Baseline 30*    Time 12    Period Weeks    Status On-going      OT LONG TERM GOAL #5   Title Pt will improve grip strength in LUE to 30 lbs or greater for increase in ability to complete clothing management and other functional tasks with LUE.    Baseline LUE 18 RUE 41    Time 12    Period Weeks    Status On-going      OT LONG TERM GOAL #6   Title Pt will improve fine motor coordination in LUE by completing 9 hole peg test in 2 minutes or less    Baseline 2 pegs in 2 minutes    Time 12    Period Weeks    Status On-going      OT LONG TERM GOAL #7   Title Pt will complete FOTO And score 66% or greater.    Baseline 54%    Time 12    Period Weeks    Status On-going                  Patient will benefit from skilled therapeutic intervention in order to improve the following deficits and impairments:           Visit Diagnosis: No diagnosis found.    Problem List Patient Active Problem List   Diagnosis Date Noted  . Slow transit constipation   . Sleep disturbance   . Basal ganglia stroke ([place])   . Incontinence of feces   . Leukocytosis   . Seizures ([place])   . Spastic hemiparesis ([place])   . Cerebrovascular accident (CVA) of  right basal ganglia ([place]) 09/30/2020  . CVA (cerebral vascular accident) ([place]) 09/26/2020  . Gait disorder 10/12/2016  . Memory loss 10/12/2016  . Epilepsy ([place]) 03/30/2013  . Malignant neoplasm of brain ([place]) 03/30/2013  . 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

## 2021-01-28 NOTE — Therapy (Signed)
[facility] [address] [place], [place], [address] Phone: [phone]   Fax:  [phone]  Physical Therapy Treatment  Patient Details  Name: Howard White MRN: [id number] Date of Birth: 1961-10-28 Referring Provider (PT): Charlett Blake, MD   Encounter Date: 01/27/2021   PT End of Session - 01/28/21 0921    Visit Number 7    Number of Visits 13    Date for PT Re-Evaluation 02/27/21    Authorization Type HTA - 10th visit PN    Progress Note Due on Visit 10    PT Start Time 1400    PT Stop Time 1449    PT Time Calculation (min) 49 min    Equipment Utilized During Treatment Gait belt    Activity Tolerance Patient tolerated treatment well    Behavior During Therapy Impulsive;WFL for tasks assessed/performed           Past Medical History:  Diagnosis Date  . Brain cancer ([place])   . Seizures ([place])   . Thyroid disease    Hypothyroid    Past Surgical History:  Procedure Laterality Date  . brain cancer    . BRAIN SURGERY      There were no vitals filed for this visit.   Subjective Assessment - 01/27/21 1401    Subjective No falls since the fall in the shower.  No injuries.  Asking if therapy can work with him with the cane.    Patient is accompained by: Family member   mom   Pertinent History Epilepsy, malignant neoplasm of brain, astrocytoma brain tumor, thyroid disease, brain surgery    Limitations Standing;Walking    Patient Stated Goals Work on his balance and be able to walk by himself.    Currently in Pain? No/denies              [facility] PT Assessment - 01/27/21 1404      Standardized Balance Assessment   Standardized Balance Assessment Berg Balance Test;Five Times Sit to Stand;Dynamic Gait Index    Five times sit to stand comments  18 seconds from mat, no UE support, improved control and no posterior LOB      Berg Balance Test   Sit to Stand Needs minimal aid to stand or to stabilize     Standing Unsupported Able to stand 2 minutes with supervision    Sitting with Back Unsupported but Feet Supported on Floor or Stool Able to sit safely and securely 2 minutes    Stand to Sit Controls descent by using hands    Transfers Needs one person to assist    Standing Unsupported with Eyes Closed Able to stand 10 seconds with supervision    Standing Unsupported with Feet Together Able to place feet together independently and stand for 1 minute with supervision    From Standing, Reach Forward with Outstretched Arm Can reach forward >12 cm safely (5")    From Standing Position, Pick up Object from Floor Able to pick up shoe, needs supervision    From Standing Position, Turn to Look Behind Over each Shoulder Looks behind one side only/other side shows less weight shift    Turn 360 Degrees Needs close supervision or verbal cueing    Standing Unsupported, Alternately Place Feet on Step/Stool Able to complete >2 steps/needs minimal assist    Standing Unsupported, One Foot in Front Able to take small step independently and hold 30 seconds    Standing on One Leg Tries to lift  leg/unable to hold 3 seconds but remains standing independently    Total Score 32    Berg comment: 32/56 from 26      Dynamic Gait Index   Level Surface Moderate Impairment    Change in Gait Speed Moderate Impairment    Gait with Horizontal Head Turns Moderate Impairment    Gait with Vertical Head Turns Moderate Impairment    Gait and Pivot Turn Moderate Impairment    Step Over Obstacle Moderate Impairment    Step Around Obstacles Moderate Impairment    Steps Mild Impairment    Total Score 9    DGI comment: 9/24 with Hurrycane                         [facility] Adult PT Treatment/Exercise - 01/27/21 1437      Neuro Re-ed    Neuro Re-ed Details  Postural retraining in sitting and standing with focus on head righting to midline and initiating weight shifting to L from pelvis and trunk with therapist  facilitating at head, trunk and pelvis with tactile, verbal and visual cues.  In standing also had pt reach up and to the right pushing into therapist's hand for trunk elongation on R and LE extension on L.                  PT Education - 01/28/21 0920    Education Details progress towards goals, still not safe to perform gait at home with cane with father    Person(s) Educated Patient;Parent(s)    Methods Explanation    Comprehension Verbalized understanding            PT Short Term Goals - 01/27/21 1431      PT SHORT TERM GOAL #1   Title Pt will demonstrate compliance with initial HEP with supervision of parents    Time 4    Period Weeks    Status Achieved    Target Date 01/28/21      PT SHORT TERM GOAL #2   Title Pt will demonstrate improved safety with transfers and will decrease time to perform five time sit to stand to 18 seconds with min A    Time 4    Period Weeks    Status Achieved    Target Date 01/28/21      PT SHORT TERM GOAL #3   Title Pt will improve BERG to >/= 30/56 to indicate decreased falls risk    Baseline 32/56    Time 4    Period Weeks    Status Achieved    Target Date 01/28/21      PT SHORT TERM GOAL #4   Title Pt will demonstrate decreased falls risk when ambulating with cane as indicated by increase in DGI to >/= 11/24    Baseline 9/24    Time 4    Period Weeks    Status Partially Met    Target Date 01/28/21             PT Long Term Goals - 12/29/20 1759      PT LONG TERM GOAL #1   Title Pt will demonstrate ability to perform final HEP and walking program with supervision    Time 8    Period Weeks    Status New    Target Date 02/27/21      PT LONG TERM GOAL #2   Title Pt will report improvement in LE function on FOTO to >/=  66%    Baseline 54%    Time 8    Period Weeks    Status New    Target Date 02/27/21      PT LONG TERM GOAL #3   Title Pt will decrease five time sit to stand to </= 15 seconds with supervision     Time 8    Period Weeks    Status New    Target Date 02/27/21      PT LONG TERM GOAL #4   Title Pt will increase BERG to >/= 40/56    Time 8    Period Weeks    Status New    Target Date 02/27/21      PT LONG TERM GOAL #5   Title Pt will increase DGI to >/= 18/24    Time 8    Period Weeks    Status New    Target Date 02/27/21      PT LONG TERM GOAL #6   Title Pt will demonstrate ability to safely stand from the floor MOD I with use of UE    Time 8    Period Weeks    Status New    Target Date 02/27/21      PT LONG TERM GOAL #7   Title Pt will ambulate x 500' outside with LRAD and supervision and will negotiate 12 stairs with R rail (ascending) with supervision to be able to access upstairs bedroom    Time 8    Period Weeks    Status New    Target Date 02/27/21                 Plan - 01/28/21 6283    Clinical Impression Statement Treatment session focused on assessment of progress towards STG.  Pt is making good progress towards goals and has met 3/4 STG.  Pt demonstrates increased independence, safety and control with sit to stand/transfers as indicated by improvement in five time sit to stand and BERG and is demonstrating improved safety with gait with cane.  Pt does continue to demonstrate impulsivity and decreased safety awareness and continues to be at high falls risk.  PT is not recommending pt ambulate at home with father with cane at this time due to ongoing falls risk.  Pt will benefit from continued skilled PT services to address ongoing impairments in coordination, motor planning, postural control, dynamic standing balance and gait to maximize functional mobility independence and decrease falls risk.    Personal Factors and Comorbidities Comorbidity 3+;Past/Current Experience;Social Background    Comorbidities Epilepsy, malignant neoplasm of brain, astrocytoma brain tumor, thyroid disease, brain surgery, lives with elderly parents    Examination-Activity  Limitations [place];Locomotion Level;Stairs;Transfers;Bathing;Toileting    Examination-Participation Restrictions Community Activity;Meal Prep;Shop    Stability/Clinical Decision Making Evolving/Moderate complexity    Rehab Potential Good    PT Frequency 2x / week    PT Duration 8 weeks    PT Treatment/Interventions ADLs/Self Care Home Management;Aquatic Therapy;DME Instruction;Gait training;Cognitive remediation;Stair training;Patient/family education;Functional mobility training;Orthotic Fit/Training;Therapeutic activities;Therapeutic exercise;Balance training;Neuromuscular re-education;Passive range of motion;Vestibular;Visual/perceptual remediation/compensation    PT Next Visit Plan continued to work on controlled sit <> stand, standing balance/coordination as indicated.  NMR/WB focusing on attention to L, balance when turning, reorientation to midline/weight shifting/head and trunk righting.  Gait training/safety with cane; stair negotiation - work on weaning off of wheelchair.    PT Home Exercise Plan Access Code: [email or web address]    Consulted and Agree with Plan of Care Patient;Family member/caregiver  Family Member Consulted parents           Patient will benefit from skilled therapeutic intervention in order to improve the following deficits and impairments:  Abnormal gait,Decreased balance,Decreased cognition,Decreased coordination,Difficulty walking,Impaired tone,Impaired UE functional use,Impaired vision/preception,Postural dysfunction,Pain  Visit Diagnosis: Unsteadiness on feet  Hemiplegia and hemiparesis following other cerebrovascular disease affecting left non-dominant side (HCC)  Muscle weakness (generalized)  Repeated falls  Other symptoms and signs involving the nervous system  Other abnormalities of gait and mobility     Problem List Patient Active Problem List   Diagnosis Date Noted  . Slow transit constipation   . Sleep disturbance   . Basal ganglia stroke ([place])    . Incontinence of feces   . Leukocytosis   . Seizures ([place])   . Spastic hemiparesis ([place])   . Cerebrovascular accident (CVA) of right basal ganglia ([place]) 09/30/2020  . CVA (cerebral vascular accident) ([place]) 09/26/2020  . Gait disorder 10/12/2016  . Memory loss 10/12/2016  . Epilepsy ([place]) 03/30/2013  . Malignant neoplasm of brain ([place]) 03/30/2013  . Astrocytoma brain tumor ([place]) 03/27/2012   Rico Junker, PT, DPT 01/28/21    9:51 AM    [facility] [address] [place], [place], [address] Phone: [phone]   Fax:  [phone]  Name: Hank Walling MRN: [id number] Date of Birth: 03/05/1961

## 2021-01-29 ENCOUNTER — Encounter: Payer: PPO | Admitting: Occupational Therapy

## 2021-01-29 ENCOUNTER — Encounter: Payer: Self-pay | Admitting: Physical Therapy

## 2021-01-29 ENCOUNTER — Other Ambulatory Visit: Payer: Self-pay

## 2021-01-29 ENCOUNTER — Ambulatory Visit: Payer: PPO | Admitting: Physical Therapy

## 2021-01-29 DIAGNOSIS — R2681 Unsteadiness on feet: Secondary | ICD-10-CM

## 2021-01-29 DIAGNOSIS — R29818 Other symptoms and signs involving the nervous system: Secondary | ICD-10-CM

## 2021-01-29 DIAGNOSIS — I69854 Hemiplegia and hemiparesis following other cerebrovascular disease affecting left non-dominant side: Secondary | ICD-10-CM

## 2021-01-29 DIAGNOSIS — M6281 Muscle weakness (generalized): Secondary | ICD-10-CM

## 2021-01-29 DIAGNOSIS — R296 Repeated falls: Secondary | ICD-10-CM

## 2021-01-29 DIAGNOSIS — R278 Other lack of coordination: Secondary | ICD-10-CM

## 2021-01-29 DIAGNOSIS — R2689 Other abnormalities of gait and mobility: Secondary | ICD-10-CM

## 2021-01-29 NOTE — Therapy (Signed)
[facility] [address] [place], [place], [address] Phone: [phone]   Fax:  [phone]  Physical Therapy Treatment  Patient Details  Name: Howard White MRN: [id number] Date of Birth: 10/23/1961 Referring Provider (PT): Charlett Blake, MD   Encounter Date: 01/29/2021   PT End of Session - 01/29/21 1107    Visit Number 8    Number of Visits 13    Date for PT Re-Evaluation 02/27/21    Authorization Type HTA - 10th visit PN    Progress Note Due on Visit 10    PT Start Time 1102    PT Stop Time 1145    PT Time Calculation (min) 43 min    Equipment Utilized During Treatment Gait belt    Activity Tolerance Patient tolerated treatment well    Behavior During Therapy Impulsive;WFL for tasks assessed/performed           Past Medical History:  Diagnosis Date  . Brain cancer ([place])   . Seizures ([place])   . Thyroid disease    Hypothyroid    Past Surgical History:  Procedure Laterality Date  . brain cancer    . BRAIN SURGERY      There were no vitals filed for this visit.   Subjective Assessment - 01/29/21 1106    Subjective No new complaints. No new falls or pain to report. Mom reports they have not been doing the HEP at home as much as he is usually too tired from therapy sessions.    Patient is accompained by: Family member   mom   Pertinent History Epilepsy, malignant neoplasm of brain, astrocytoma brain tumor, thyroid disease, brain surgery    Limitations Standing;Walking    Patient Stated Goals Work on his balance and be able to walk by himself.    Currently in Pain? No/denies    Pain Score 0-No pain                 [facility] Adult PT Treatment/Exercise - 01/29/21 1108      Transfers   Transfers Sit to Stand;Stand to [employer] Transfers    Sit to Stand 4: Min guard;With upper extremity assist;From bed;From chair/3-in-1    Stand to Sit 4: Min guard;With upper extremity assist;To bed;To  chair/3-in-1    Stand Pivot Transfers 4: Min guard    Stand Pivot Transfer Details (indicate cue type and reason) wheelchair>mat table with cues to slow down, 3-4 steps taken.      Ambulation/Gait   Ambulation/Gait --      Neuro Re-ed    Neuro Re-ed Details  for postural re-alingment/strengthening/balance: seated on green air disc:working on obtaining neurtral pelvic position, followed by shoulder postitioning, ending with neutral neck/head positioning with cues/facilitaition needed. Progressing to working on UE reaching up for trunk elongation, then cross body reaching with emphasis on return to neutral posture when back, cues/facilitation needed. min guard assist to min asssist with cues to slow down as well;  seated on rocker board in lateral direction: working on pelvic mobility while holding trunk erect/in neutral (or as close as able to obtain), progressing to UE reaching with rocking for trunk elongation/stretching with cues/faciliation needed. min guard to min assist. ; standing with use of mirror for feedback: focused on weight shifting and postural allingment to get pt more neutral with cues/facilitaiton needed as pt tends to stay on right side. worked on lateral weight shfiting, having pt bring hips then shoulder toward PTA on left and use  of left UE to push PTA hands laterally in downward dirction all to facilition posture/equal weight bearing on LE's; Progressed to working on alternating light forward foot taps to foam bubbles with continued cues on stance position, weight shifting and posture with min to mod assist. increased assistance needed when pt tried to rush activity.                    PT Short Term Goals - 01/27/21 1431      PT SHORT TERM GOAL #1   Title Pt will demonstrate compliance with initial HEP with supervision of parents    Time 4    Period Weeks    Status Achieved    Target Date 01/28/21      PT SHORT TERM GOAL #2   Title Pt will demonstrate improved  safety with transfers and will decrease time to perform five time sit to stand to 18 seconds with min A    Time 4    Period Weeks    Status Achieved    Target Date 01/28/21      PT SHORT TERM GOAL #3   Title Pt will improve BERG to >/= 30/56 to indicate decreased falls risk    Baseline 32/56    Time 4    Period Weeks    Status Achieved    Target Date 01/28/21      PT SHORT TERM GOAL #4   Title Pt will demonstrate decreased falls risk when ambulating with cane as indicated by increase in DGI to >/= 11/24    Baseline 9/24    Time 4    Period Weeks    Status Partially Met    Target Date 01/28/21             PT Long Term Goals - 12/29/20 1759      PT LONG TERM GOAL #1   Title Pt will demonstrate ability to perform final HEP and walking program with supervision    Time 8    Period Weeks    Status New    Target Date 02/27/21      PT LONG TERM GOAL #2   Title Pt will report improvement in LE function on FOTO to >/= 66%    Baseline 54%    Time 8    Period Weeks    Status New    Target Date 02/27/21      PT LONG TERM GOAL #3   Title Pt will decrease five time sit to stand to </= 15 seconds with supervision    Time 8    Period Weeks    Status New    Target Date 02/27/21      PT LONG TERM GOAL #4   Title Pt will increase BERG to >/= 40/56    Time 8    Period Weeks    Status New    Target Date 02/27/21      PT LONG TERM GOAL #5   Title Pt will increase DGI to >/= 18/24    Time 8    Period Weeks    Status New    Target Date 02/27/21      PT LONG TERM GOAL #6   Title Pt will demonstrate ability to safely stand from the floor MOD I with use of UE    Time 8    Period Weeks    Status New    Target Date 02/27/21      PT LONG TERM  GOAL #7   Title Pt will ambulate x 500' outside with LRAD and supervision and will negotiate 12 stairs with R rail (ascending) with supervision to be able to access upstairs bedroom    Time 8    Period Weeks    Status New    Target  Date 02/27/21                 Plan - 01/29/21 1107    Clinical Impression Statement Today's skilled session continued to focus on postural alignment, core strengthening and standing balance with decreased UE support. Pt continues to need cues to slow down for safety at times. The pt is making progress and should benefit from continued PT to progress toward LTGs.    Personal Factors and Comorbidities Comorbidity 3+;Past/Current Experience;Social Background    Comorbidities Epilepsy, malignant neoplasm of brain, astrocytoma brain tumor, thyroid disease, brain surgery, lives with elderly parents    Examination-Activity Limitations [place];Locomotion Level;Stairs;Transfers;Bathing;Toileting    Examination-Participation Restrictions Community Activity;Meal Prep;Shop    Stability/Clinical Decision Making Evolving/Moderate complexity    Rehab Potential Good    PT Frequency 2x / week    PT Duration 8 weeks    PT Treatment/Interventions ADLs/Self Care Home Management;Aquatic Therapy;DME Instruction;Gait training;Cognitive remediation;Stair training;Patient/family education;Functional mobility training;Orthotic Fit/Training;Therapeutic activities;Therapeutic exercise;Balance training;Neuromuscular re-education;Passive range of motion;Vestibular;Visual/perceptual remediation/compensation    PT Next Visit Plan continued to work on controlled sit <> stand, standing balance/coordination as indicated.  NMR/WB focusing on attention to L, balance when turning, reorientation to midline/weight shifting/head and trunk righting.  Gait training/safety with cane; stair negotiation - work on weaning off of wheelchair.    PT Home Exercise Plan Access Code: [email or web address]    Consulted and Agree with Plan of Care Patient;Family member/caregiver    Family Member Consulted parents           Patient will benefit from skilled therapeutic intervention in order to improve the following deficits and impairments:  Abnormal  gait,Decreased balance,Decreased cognition,Decreased coordination,Difficulty walking,Impaired tone,Impaired UE functional use,Impaired vision/preception,Postural dysfunction,Pain  Visit Diagnosis: Unsteadiness on feet  Muscle weakness (generalized)  Other lack of coordination  Hemiplegia and hemiparesis following other cerebrovascular disease affecting left non-dominant side ([facility])  Repeated falls  Other symptoms and signs involving the nervous system  Other abnormalities of gait and mobility     Problem List Patient Active Problem List   Diagnosis Date Noted  . Slow transit constipation   . Sleep disturbance   . Basal ganglia stroke ([place])   . Incontinence of feces   . Leukocytosis   . Seizures ([place])   . Spastic hemiparesis ([place])   . Cerebrovascular accident (CVA) of right basal ganglia ([place]) 09/30/2020  . CVA (cerebral vascular accident) ([place]) 09/26/2020  . Gait disorder 10/12/2016  . Memory loss 10/12/2016  . Epilepsy ([facility]) 03/30/2013  . Malignant neoplasm of brain ([place]) 03/30/2013  . Astrocytoma brain tumor [facility]) 03/27/2012    Willow Ora, PTA, [facility] [address] [place], [place] [address] [phone] 01/30/21, 10:33 AM   Name: Marvelle Caudill MRN: [id number] Date of Birth: 10-Apr-1961

## 2021-02-02 ENCOUNTER — Encounter: Payer: Self-pay | Admitting: Occupational Therapy

## 2021-02-02 ENCOUNTER — Ambulatory Visit: Payer: PPO | Admitting: Occupational Therapy

## 2021-02-02 DIAGNOSIS — R2681 Unsteadiness on feet: Secondary | ICD-10-CM | POA: Diagnosis not present

## 2021-02-02 NOTE — Therapy (Signed)
[facility] [address] [place], [place], [address] Phone: [phone]   Fax:  [phone]  Occupational Therapy Treatment  Patient Details  Name: Howard White MRN: [id number] Date of Birth: 1961-11-14 Referring Provider (OT): Alysia Penna, MD   Encounter Date: 02/02/2021   OT End of Session - 02/02/21 1425    Visit Number 9    Number of Visits 25    Date for OT Re-Evaluation 03/23/21    Authorization Type [employer]    Authorization Time Period $15 copay for OT, VL:MN    OT Start Time 1325    OT Stop Time 1415    OT Time Calculation (min) 50 min    Activity Tolerance Patient tolerated treatment well    Behavior During Therapy [facility] for tasks assessed/performed           Past Medical History:  Diagnosis Date  . Brain cancer ([facility])   . Seizures ([place])   . Thyroid disease    Hypothyroid    Past Surgical History:  Procedure Laterality Date  . brain cancer    . BRAIN SURGERY      There were no vitals filed for this visit.   Subjective Assessment - 02/02/21 1424    Subjective  Need to stretch my arm and work on balance.  I have strength, but not coordination    Patient is accompanied by: Family member    Pertinent History Brain Tumor 2001. Hx of Seizures    Limitations Seizures. Fall Risk    Patient Stated Goals Walk again and use a cane. "foot, hand and balance"    Currently in Pain? No/denies    Pain Score 0-No pain            Patient seen for aquatic therapy today,  Entered and exited via stairs and min assist.  Treatment occurred in 3.5-4 ft of water.  Using buoyancy and water resistance worked to address postural alignment ad control with sit to stand, stand to sit, walking and stepping up.  Patient lacks ability to maintain weight on left side and weight shift forward toward toe of left foot.  Working to decrease step width and also increase step length while aligning shoulder girdle  and head over base of support.  Worked on improving lateral weight shift to left in sitting by low to mid lateral reaching with LUE.  Patient with decreased UE tension during walking in water as pace is slower, and less influence of gravity,                     OT Education - 02/02/21 1424    Education Details shifting weight over to left and forward with foot in contact with ground - not on toes    Person(s) Educated Patient    Methods Explanation;Demonstration;Tactile cues;Verbal cues    Comprehension Need further instruction            OT Short Term Goals - 02/02/21 1427      OT SHORT TERM GOAL #1   Title Pt will be independent with initial HEP 01/26/2021    Time 4    Period Weeks    Status On-going    Target Date 01/26/21      OT SHORT TERM GOAL #2   Title Pt will improve wrist extension in LUE to 35 degrees for improving functional use of LUE.    Baseline LUE 30 passive - unable to maintain position actively.  Time 4    Period Weeks    Status On-going      OT SHORT TERM GOAL #3   Title Pt will improve attention, balance and range of motion in order to complete toilet hygiene with no physical assistance.    Time 4    Period Weeks    Status On-going      OT SHORT TERM GOAL #4   Title Pt will perform home management and/or simple meal prep with supervision and good safety awareness    Time 4    Period Weeks    Status On-going      OT SHORT TERM GOAL #5   Title Pt will perform all basic ADLs without physical assistance.    Time 4    Period Weeks    Status On-going      OT SHORT TERM GOAL #6   Title Pt will improve functional use of LUE by scoring 25 or greater on Box and Blocks with LUE.    Baseline RUE 41, LUE 18    Time 4    Period Weeks    Status On-going             OT Long Term Goals - 02/02/21 1427      OT LONG TERM GOAL #1   Title Pt will be independent with updated HEP    Time 12    Period Weeks    Status On-going      OT  LONG TERM GOAL #2   Title Pt will complete environmental scanning with 95% accuracy.    Time 12    Period Weeks    Status On-going      OT LONG TERM GOAL #3   Title Pt will attend to a novel cognitive task for 15 minutes or greater in a moderately distracting environment with no cues for redirection.    Time 12    Period Weeks    Status On-going      OT LONG TERM GOAL #4   Title Pt will achieve wrist extension of 45 degrees or greater in LUE    Baseline 30*    Time 12    Period Weeks    Status On-going      OT LONG TERM GOAL #5   Title Pt will improve grip strength in LUE to 30 lbs or greater for increase in ability to complete clothing management and other functional tasks with LUE.    Baseline LUE 18 RUE 41    Time 12    Period Weeks    Status On-going      OT LONG TERM GOAL #6   Title Pt will improve fine motor coordination in LUE by completing 9 hole peg test in 2 minutes or less    Baseline 2 pegs in 2 minutes    Time 12    Period Weeks    Status On-going      OT LONG TERM GOAL #7   Title Pt will complete FOTO And score 66% or greater.    Baseline 54%    Time 12    Period Weeks    Status On-going                 Plan - 02/02/21 1426    Clinical Impression Statement Pt able to slow movement in aquatic environment, oftenspontaneously  closing eyes to feel weight shift differently    OT Occupational Profile and History Detailed Assessment- Review of Records and additional review of  physical, cognitive, psychosocial history related to current functional performance    Occupational performance deficits (Please refer to evaluation for details): ADL's    Body Structure / Function / Physical Skills ADL;ROM;UE functional use;FMC;Decreased knowledge of use of DME;Balance;Vision;Dexterity;GMC;Coordination;IADL;Proprioception;Tone;Strength;Pain;Endurance    Cognitive Skills Attention;Safety Awareness;Sequencing;Orientation;Memory;Understand;Problem Solve    Rehab  Potential Good    Clinical Decision Making Several treatment options, min-mod task modification necessary    Comorbidities Affecting Occupational Performance: May have comorbidities impacting occupational performance    Modification or Assistance to Complete Evaluation  No modification of tasks or assist necessary to complete eval    OT Frequency 2x / week    OT Duration 12 weeks   24 visits + eval   OT Treatment/Interventions Self-care/ADL training;Therapeutic exercise;Visual/perceptual remediation/compensation;Patient/family education;Splinting;Neuromuscular education;Moist Heat;Aquatic Therapy;DME and/or AE instruction;Passive range of motion;Therapist, nutritional;Therapeutic activities;Energy conservation;Electrical Stimulation;Manual Therapy    Plan Review initial HEP and add/update prn, aquatics for postural realignment and activation, balance, gross motor coordiantion, and LUE fuctional ROM.    Recommended Other Services consider ST?    Consulted and Agree with Plan of Care Patient;Family member/caregiver   mom and dad   Family Member Consulted Mom and Dad           Patient will benefit from skilled therapeutic intervention in order to improve the following deficits and impairments:   Body Structure / Function / Physical Skills: ADL,ROM,UE functional use,FMC,Decreased knowledge of use of DME,Balance,Vision,Dexterity,GMC,Coordination,IADL,Proprioception,Tone,Strength,Pain,Endurance Cognitive Skills: Attention,Safety Awareness,Sequencing,Orientation,Memory,Understand,Problem Solve     Visit Diagnosis: Hemiplegia and hemiparesis following other cerebrovascular disease affecting left non-dominant side ([facility])  Attention and concentration deficit  Visuospatial deficit  Unsteadiness on feet  Stiffness of left shoulder, not elsewhere classified  Muscle weakness (generalized)  Other symptoms and signs involving the nervous system  Other lack of coordination    Problem  List Patient Active Problem List   Diagnosis Date Noted  . Slow transit constipation   . Sleep disturbance   . Basal ganglia stroke ([place])   . Incontinence of feces   . Leukocytosis   . Seizures ([place])   . Spastic hemiparesis ([place])   . Cerebrovascular accident (CVA) of right basal ganglia ([place]) 09/30/2020  . CVA (cerebral vascular accident) ([place]) 09/26/2020  . Gait disorder 10/12/2016  . Memory loss 10/12/2016  . Epilepsy ([place]) 03/30/2013  . Malignant neoplasm of brain ([place]) 03/30/2013  . Astrocytoma brain tumor [facility]) 03/27/2012    Mariah Milling, OTR/L 02/02/2021, 2:29 PM  [facility] [address] [place], [place], [address] Phone: [phone]   Fax:  [phone]  Name: Howard White MRN: [id number] Date of Birth: 04/12/1961

## 2021-02-03 ENCOUNTER — Encounter: Payer: Self-pay | Admitting: Physical Therapy

## 2021-02-03 ENCOUNTER — Encounter: Payer: Self-pay | Admitting: Occupational Therapy

## 2021-02-03 ENCOUNTER — Other Ambulatory Visit: Payer: Self-pay

## 2021-02-03 ENCOUNTER — Ambulatory Visit: Payer: PPO | Admitting: Physical Therapy

## 2021-02-03 ENCOUNTER — Ambulatory Visit: Payer: PPO | Admitting: Occupational Therapy

## 2021-02-03 DIAGNOSIS — R2681 Unsteadiness on feet: Secondary | ICD-10-CM | POA: Diagnosis not present

## 2021-02-03 DIAGNOSIS — R29818 Other symptoms and signs involving the nervous system: Secondary | ICD-10-CM

## 2021-02-03 DIAGNOSIS — I69854 Hemiplegia and hemiparesis following other cerebrovascular disease affecting left non-dominant side: Secondary | ICD-10-CM

## 2021-02-03 DIAGNOSIS — M25612 Stiffness of left shoulder, not elsewhere classified: Secondary | ICD-10-CM

## 2021-02-03 DIAGNOSIS — M6281 Muscle weakness (generalized): Secondary | ICD-10-CM

## 2021-02-03 DIAGNOSIS — R2689 Other abnormalities of gait and mobility: Secondary | ICD-10-CM

## 2021-02-03 DIAGNOSIS — R296 Repeated falls: Secondary | ICD-10-CM

## 2021-02-03 DIAGNOSIS — R41840 Attention and concentration deficit: Secondary | ICD-10-CM

## 2021-02-03 DIAGNOSIS — R41842 Visuospatial deficit: Secondary | ICD-10-CM

## 2021-02-03 NOTE — Therapy (Signed)
[facility] [address] [place], [place], [address] Phone: [phone]   Fax:  [phone]  Occupational Therapy Treatment  Patient Details  Name: Briton Sellman MRN: [id number] Date of Birth: 06/20/1961 Referring Provider (OT): Alysia Penna, MD   Encounter Date: 02/03/2021   OT End of Session - 02/03/21 1150    Visit Number 10    Number of Visits 25    Date for OT Re-Evaluation 03/23/21    Authorization Type [employer]    Authorization Time Period $15 copay for OT, VL:MN    OT Start Time 1148    OT Stop Time 1227    OT Time Calculation (min) 39 min    Activity Tolerance Patient tolerated treatment well    Behavior During Therapy [facility] for tasks assessed/performed           Past Medical History:  Diagnosis Date  . Brain cancer ([place])   . Seizures ([place])   . Thyroid disease    Hypothyroid    Past Surgical History:  Procedure Laterality Date  . brain cancer    . BRAIN SURGERY      There were no vitals filed for this visit.   Subjective Assessment - 02/03/21 1150    Subjective  My issues are balance.    Patient is accompanied by: Family member    Pertinent History Brain Tumor 2001. Hx of Seizures    Limitations Seizures. Fall Risk    Patient Stated Goals Walk again and use a cane. "foot, hand and balance"    Currently in Pain? No/denies             TREATMENT   Neuromuscular Reeducation for postural control and working on hip/trunk dissociation. Seated cat/cow anterior and posterior pelvic tilt, trunk rotation bilaterally, forward reaching to floor. Pt rotated trunk to left to obtain 1 inch blocks with LUE one at a time and placing into bowl. Max cues for keeping head upright and sitting up with good upright sitting posture. Pt worked on grasp and release and functional use of LUE with resistance clothespins (yellow and red) with mod difficulty and drops. Pt transitioned to supine and  completed supine cane exercises with unweighted dowel for shoulder flexion, chest press and horizontal abduction x 10.                       OT Short Term Goals - 02/02/21 1427      OT SHORT TERM GOAL #1   Title Pt will be independent with initial HEP 01/26/2021    Time 4    Period Weeks    Status On-going    Target Date 01/26/21      OT SHORT TERM GOAL #2   Title Pt will improve wrist extension in LUE to 35 degrees for improving functional use of LUE.    Baseline LUE 30 passive - unable to maintain position actively.    Time 4    Period Weeks    Status On-going      OT SHORT TERM GOAL #3   Title Pt will improve attention, balance and range of motion in order to complete toilet hygiene with no physical assistance.    Time 4    Period Weeks    Status On-going      OT SHORT TERM GOAL #4   Title Pt will perform home management and/or simple meal prep with supervision and good safety awareness    Time 4  Period Weeks    Status On-going      OT SHORT TERM GOAL #5   Title Pt will perform all basic ADLs without physical assistance.    Time 4    Period Weeks    Status On-going      OT SHORT TERM GOAL #6   Title Pt will improve functional use of LUE by scoring 25 or greater on Box and Blocks with LUE.    Baseline RUE 41, LUE 18    Time 4    Period Weeks    Status On-going             OT Long Term Goals - 02/02/21 1427      OT LONG TERM GOAL #1   Title Pt will be independent with updated HEP    Time 12    Period Weeks    Status On-going      OT LONG TERM GOAL #2   Title Pt will complete environmental scanning with 95% accuracy.    Time 12    Period Weeks    Status On-going      OT LONG TERM GOAL #3   Title Pt will attend to a novel cognitive task for 15 minutes or greater in a moderately distracting environment with no cues for redirection.    Time 12    Period Weeks    Status On-going      OT LONG TERM GOAL #4   Title Pt will achieve  wrist extension of 45 degrees or greater in LUE    Baseline 30*    Time 12    Period Weeks    Status On-going      OT LONG TERM GOAL #5   Title Pt will improve grip strength in LUE to 30 lbs or greater for increase in ability to complete clothing management and other functional tasks with LUE.    Baseline LUE 18 RUE 41    Time 12    Period Weeks    Status On-going      OT LONG TERM GOAL #6   Title Pt will improve fine motor coordination in LUE by completing 9 hole peg test in 2 minutes or less    Baseline 2 pegs in 2 minutes    Time 12    Period Weeks    Status On-going      OT LONG TERM GOAL #7   Title Pt will complete FOTO And score 66% or greater.    Baseline 54%    Time 12    Period Weeks    Status On-going                 Plan - 02/03/21 1357    Clinical Impression Statement Pt with better upright sitting posture this day but continues to have forward head.    OT Occupational Profile and History Detailed Assessment- Review of Records and additional review of physical, cognitive, psychosocial history related to current functional performance    Occupational performance deficits (Please refer to evaluation for details): ADL's    Body Structure / Function / Physical Skills ADL;ROM;UE functional use;FMC;Decreased knowledge of use of DME;Balance;Vision;Dexterity;GMC;Coordination;IADL;Proprioception;Tone;Strength;Pain;Endurance    Cognitive Skills Attention;Safety Awareness;Sequencing;Orientation;Memory;Understand;Problem Solve    Rehab Potential Good    Clinical Decision Making Several treatment options, min-mod task modification necessary    Comorbidities Affecting Occupational Performance: May have comorbidities impacting occupational performance    Modification or Assistance to Complete Evaluation  No modification of tasks or assist necessary to  complete eval    OT Frequency 2x / week    OT Duration 12 weeks   24 visits + eval   OT Treatment/Interventions  Self-care/ADL training;Therapeutic exercise;Visual/perceptual remediation/compensation;Patient/family education;Splinting;Neuromuscular education;Moist Heat;Aquatic Therapy;DME and/or AE instruction;Passive range of motion;Therapist, nutritional;Therapeutic activities;Energy conservation;Electrical Stimulation;Manual Therapy    Plan Review initial HEP and add/update prn, aquatics for postural realignment and activation, balance, gross motor coordiantion, and LUE fuctional ROM.    Recommended Other Services consider ST?    Consulted and Agree with Plan of Care Patient;Family member/caregiver   mom and dad   Family Member Consulted Mom and Dad           Patient will benefit from skilled therapeutic intervention in order to improve the following deficits and impairments:   Body Structure / Function / Physical Skills: ADL,ROM,UE functional use,FMC,Decreased knowledge of use of DME,Balance,Vision,Dexterity,GMC,Coordination,IADL,Proprioception,Tone,Strength,Pain,Endurance Cognitive Skills: Attention,Safety Awareness,Sequencing,Orientation,Memory,Understand,Problem Solve     Visit Diagnosis: Hemiplegia and hemiparesis following other cerebrovascular disease affecting left non-dominant side (HCC)  Attention and concentration deficit  Visuospatial deficit  Unsteadiness on feet  Stiffness of left shoulder, not elsewhere classified  Muscle weakness (generalized)    Problem List Patient Active Problem List   Diagnosis Date Noted  . Slow transit constipation   . Sleep disturbance   . Basal ganglia stroke ([place])   . Incontinence of feces   . Leukocytosis   . Seizures ([place])   . Spastic hemiparesis ([place])   . Cerebrovascular accident (CVA) of right basal ganglia ([place]) 09/30/2020  . CVA (cerebral vascular accident) ([place]) 09/26/2020  . Gait disorder 10/12/2016  . Memory loss 10/12/2016  . Epilepsy ([place]) 03/30/2013  . Malignant neoplasm of brain ([place]) 03/30/2013  . Astrocytoma brain  tumor [facility]) 03/27/2012    Zachery Conch MOT, OTR/L  02/03/2021, 1:58 PM  [facility] [address] [place], [place], [address] Phone: [phone]   Fax:  [phone]  Name: Lynn Sissel MRN: [id number] Date of Birth: [birth year]

## 2021-02-03 NOTE — Therapy (Signed)
[facility] [address] [place], [place], [address] Phone: [phone]   Fax:  [phone]  Physical Therapy Treatment  Patient Details  Name: Howard White MRN: [id number] Date of Birth: 1961/05/22 Referring Provider (PT): Charlett Blake, MD   Encounter Date: 02/03/2021   PT End of Session - 02/03/21 1213    Visit Number 9    Number of Visits 13    Date for PT Re-Evaluation 02/27/21    Authorization Type HTA - 10th visit PN    Progress Note Due on Visit 10    PT Start Time 1101    PT Stop Time 1145    PT Time Calculation (min) 44 min    Equipment Utilized During Treatment Gait belt    Activity Tolerance Patient tolerated treatment well    Behavior During Therapy Impulsive;WFL for tasks assessed/performed           Past Medical History:  Diagnosis Date  . Brain cancer ([place])   . Seizures ([place])   . Thyroid disease    Hypothyroid    Past Surgical History:  Procedure Laterality Date  . brain cancer    . BRAIN SURGERY      There were no vitals filed for this visit.   Subjective Assessment - 02/03/21 1106    Subjective Pt reports doing well.  Aquatic therapy went well yesterday.    Patient is accompained by: Family member   mom   Pertinent History Epilepsy, malignant neoplasm of brain, astrocytoma brain tumor, thyroid disease, brain surgery    Limitations Standing;Walking    Patient Stated Goals Work on his balance and be able to walk by himself.    Currently in Pain? No/denies                             [facility] Adult PT Treatment/Exercise - 02/03/21 1152      Transfers   Sit to Stand 4: Min guard    Sit to Stand Details Tactile cues for weight shifting;Tactile cues for posture;Tactile cues for placement;Tactile cues for weight beaing;Verbal cues for precautions/safety    Sit to Stand Details (indicate cue type and reason) x5 wihtout UE support verbal and tactile cues given for  alignment and weightshifting to L.  x5 with LLE behind without UE support.  x5 with RLE on 4in block and LLE behind wihtout UE support      Ambulation/Gait   Ambulation/Gait Yes    Ambulation/Gait Assistance 4: Min guard    Ambulation/Gait Assistance Details verbal and tactile cues to maintain alignment.  Pt was fatigued following exercises and had decreased L foot clearance following exercises    Ambulation Distance (Feet) 115 Feet    Assistive device Other (Comment)   Hurrycane   Gait Pattern Decreased stance time - left;Decreased step length - left;Decreased weight shift to left;Lateral trunk lean to right    Ambulation Surface Level;Indoor               Balance Exercises - 02/03/21 1200      Balance Exercises: Standing   Standing, One Foot on a Step Eyes open;4 inch;30 secs   R foot on step with intermitted UE support, tactile cues provided at knee joint to maintain knee extension; anterior weight shift noted and inability to maintain proper pelvic alignment.  RUE overhead reaching.   Rockerboard Anterior/posterior;Lateral;30 seconds    Rockerboard Limitations weightshifting with BUE support; mirror used for  visual cues to maintian neutral alignment, verbal cueing to shift hips forward rather than head, tactile cueing for pelvic shifting and alignment.  During L weightshifting pt unable to maintain knee ext and presented with trendelenburg that unable to be corrected with verbal or tactile cueing.  Limited ROM presented with better results.    Step Ups Forward;Intermittent UE support;Limitations   2x10 taps onto rockerboard; intermittent UE support   Step Ups Limitations difficulty maintianing pelvic and trunk alignment, able to poroperly  weightshift onto L with verbal and tactile cues             PT Education - 02/03/21 1211    Education Details pelvic and trunk alignment during static and dynamic stance, hip strategy during weightshifting ant/post    Person(s) Educated  Patient    Methods Explanation    Comprehension Verbalized understanding            PT Short Term Goals - 01/27/21 1431      PT SHORT TERM GOAL #1   Title Pt will demonstrate compliance with initial HEP with supervision of parents    Time 4    Period Weeks    Status Achieved    Target Date 01/28/21      PT SHORT TERM GOAL #2   Title Pt will demonstrate improved safety with transfers and will decrease time to perform five time sit to stand to 18 seconds with min A    Time 4    Period Weeks    Status Achieved    Target Date 01/28/21      PT SHORT TERM GOAL #3   Title Pt will improve BERG to >/= 30/56 to indicate decreased falls risk    Baseline 32/56    Time 4    Period Weeks    Status Achieved    Target Date 01/28/21      PT SHORT TERM GOAL #4   Title Pt will demonstrate decreased falls risk when ambulating with cane as indicated by increase in DGI to >/= 11/24    Baseline 9/24    Time 4    Period Weeks    Status Partially Met    Target Date 01/28/21             PT Long Term Goals - 12/29/20 1759      PT LONG TERM GOAL #1   Title Pt will demonstrate ability to perform final HEP and walking program with supervision    Time 8    Period Weeks    Status New    Target Date 02/27/21      PT LONG TERM GOAL #2   Title Pt will report improvement in LE function on FOTO to >/= 66%    Baseline 54%    Time 8    Period Weeks    Status New    Target Date 02/27/21      PT LONG TERM GOAL #3   Title Pt will decrease five time sit to stand to </= 15 seconds with supervision    Time 8    Period Weeks    Status New    Target Date 02/27/21      PT LONG TERM GOAL #4   Title Pt will increase BERG to >/= 40/56    Time 8    Period Weeks    Status New    Target Date 02/27/21      PT LONG TERM GOAL #5   Title Pt will increase  DGI to >/= 18/24    Time 8    Period Weeks    Status New    Target Date 02/27/21      PT LONG TERM GOAL #6   Title Pt will demonstrate  ability to safely stand from the floor MOD I with use of UE    Time 8    Period Weeks    Status New    Target Date 02/27/21      PT LONG TERM GOAL #7   Title Pt will ambulate x 500' outside with LRAD and supervision and will negotiate 12 stairs with R rail (ascending) with supervision to be able to access upstairs bedroom    Time 8    Period Weeks    Status New    Target Date 02/27/21                 Plan - 02/03/21 1213    Clinical Impression Statement Continued exercises focusing on postural alignment, weightbearing on LLE and neuromuscular re-education.  Visual cues with the mirror aided pts ability to self correct alignment but verbal and tactile cues are still required during all exercises to correct posture and properly weightshift onto involved side.  Pt still lacks ability to maintain knee and hip extnesion throughout entire balance exercise.  Pt appears to have better safety awareness and consciously moves slower and better waits for therapist instructions to begin exercise.    Personal Factors and Comorbidities Comorbidity 3+;Past/Current Experience;Social Background    Comorbidities Epilepsy, malignant neoplasm of brain, astrocytoma brain tumor, thyroid disease, brain surgery, lives with elderly parents    Examination-Activity Limitations [place];Locomotion Level;Stairs;Transfers;Bathing;Toileting    Examination-Participation Restrictions Community Activity;Meal Prep;Shop    Stability/Clinical Decision Making Evolving/Moderate complexity    Rehab Potential Good    PT Frequency 2x / week    PT Duration 8 weeks    PT Treatment/Interventions ADLs/Self Care Home Management;Aquatic Therapy;DME Instruction;Gait training;Cognitive remediation;Stair training;Patient/family education;Functional mobility training;Orthotic Fit/Training;Therapeutic activities;Therapeutic exercise;Balance training;Neuromuscular re-education;Passive range of motion;Vestibular;Visual/perceptual  remediation/compensation    PT Next Visit Plan 10th visit progress note; controlling sit <> stand LLE weightbearing emphasis; resisted sit <> stand, NMR/WB focusing on attention to L, balance when turning, reorientation to midline/weight shifting/head and trunk righting.  Gait training/safety with cane; stair negotiation - work on weaning off of wheelchair.    PT Home Exercise Plan Access Code: [email or web address]    Consulted and Agree with Plan of Care Patient;Family member/caregiver    Family Member Consulted parents           Patient will benefit from skilled therapeutic intervention in order to improve the following deficits and impairments:  Abnormal gait,Decreased balance,Decreased cognition,Decreased coordination,Difficulty walking,Impaired tone,Impaired UE functional use,Impaired vision/preception,Postural dysfunction,Pain  Visit Diagnosis: Hemiplegia and hemiparesis following other cerebrovascular disease affecting left non-dominant side ([place])  Unsteadiness on feet  Muscle weakness  Other abnormalities of gait and mobility  Repeated falls  Other symptoms and signs involving the nervous system     Problem List Patient Active Problem List   Diagnosis Date Noted  . Slow transit constipation   . Sleep disturbance   . Basal ganglia stroke ([place])   . Incontinence of feces   . Leukocytosis   . Seizures ([place])   . Spastic hemiparesis ([place])   . Cerebrovascular accident (CVA) of right basal ganglia ([place]) 09/30/2020  . CVA (cerebral vascular accident) ([place]) 09/26/2020  . Gait disorder 10/12/2016  . Memory loss 10/12/2016  . Epilepsy ([place]) 03/30/2013  . Malignant neoplasm  of brain ([place]) 03/30/2013  . Astrocytoma brain tumor [facility]) 03/27/2012    Yetta Numbers, SPT 02/03/2021, 12:22 PM  [facility] [address] [place], [place], [address] Phone: [phone]   Fax:  [phone]  Name: Alondra Sahni MRN: [id number] Date of  Birth: 01-07-1961

## 2021-02-05 ENCOUNTER — Other Ambulatory Visit: Payer: Self-pay

## 2021-02-05 ENCOUNTER — Ambulatory Visit: Payer: PPO | Admitting: Occupational Therapy

## 2021-02-05 ENCOUNTER — Encounter: Payer: Self-pay | Admitting: Physical Therapy

## 2021-02-05 ENCOUNTER — Ambulatory Visit: Payer: PPO | Admitting: Physical Therapy

## 2021-02-05 DIAGNOSIS — R2681 Unsteadiness on feet: Secondary | ICD-10-CM | POA: Diagnosis not present

## 2021-02-05 DIAGNOSIS — I69854 Hemiplegia and hemiparesis following other cerebrovascular disease affecting left non-dominant side: Secondary | ICD-10-CM

## 2021-02-05 DIAGNOSIS — R296 Repeated falls: Secondary | ICD-10-CM

## 2021-02-05 DIAGNOSIS — R29818 Other symptoms and signs involving the nervous system: Secondary | ICD-10-CM

## 2021-02-05 DIAGNOSIS — R2689 Other abnormalities of gait and mobility: Secondary | ICD-10-CM

## 2021-02-05 DIAGNOSIS — M6281 Muscle weakness (generalized): Secondary | ICD-10-CM

## 2021-02-05 NOTE — Therapy (Signed)
[facility] [address] [place], [place], [address] Phone: [phone]   Fax:  [phone]  Physical Therapy Treatment and 10th Visit Progress Note  Patient Details  Name: Howard White MRN: [id number] Date of Birth: 12-25-1960 Referring Provider (PT): Letta Pate Luanna Salk, MD   Encounter Date: 02/05/2021   Progress Note Reporting Period 12/29/2020 to 02/05/2021  See note below for Objective Data and Assessment of Progress/Goals.      PT End of Session - 02/05/21 1124    Visit Number 10    Number of Visits 13    Date for PT Re-Evaluation 02/27/21    Authorization Type HTA - 10th visit PN    Progress Note Due on Visit 20    PT Start Time 1017    PT Stop Time 1105    PT Time Calculation (min) 48 min    Equipment Utilized During Treatment Gait belt    Activity Tolerance Patient tolerated treatment well    Behavior During Therapy Impulsive;WFL for tasks assessed/performed           Past Medical History:  Diagnosis Date  . Brain cancer ([place])   . Seizures ([place])   . Thyroid disease    Hypothyroid    Past Surgical History:  Procedure Laterality Date  . brain cancer    . BRAIN SURGERY      There were no vitals filed for this visit.   Subjective Assessment - 02/05/21 1021    Subjective Pt reports feeling good.  He says he was sore after last therapy session but reports no soreness today.    Patient is accompained by: Family member   mom   Pertinent History Epilepsy, malignant neoplasm of brain, astrocytoma brain tumor, thyroid disease, brain surgery    Limitations Standing;Walking    Patient Stated Goals Work on his balance and be able to walk by himself.    Currently in Pain? No/denies                             [facility] Adult PT Treatment/Exercise - 02/05/21 1109      Transfers   Sit to Stand 4: Min guard    Sit to Stand Details Tactile cues for sequencing;Tactile cues for weight  shifting;Tactile cues for posture;Verbal cues for sequencing    Sit to Stand Details (indicate cue type and reason) x10; LUE facilitation with pt pushing into therapist hand; intention to shift pelvis and trunk and provide a more even distribution of weight when standing      Posture/Postural Control   Posture Comments Neuro-reducation through LUE facilitation in seated.  Pt pushed into therapist hand as he reach forward and to the R.  Intention for more thoraic rotation and pelvis and trunk awareness.  Verbal and tactile cues to limit shoulder elevation and proper trunk movement.      Neuro Re-ed    Neuro Re-ed Details  Tall kneeling on the mat x15 roll-outs with stability ball and BUE support.  Verbal and tactile cueing needed to hinge at hips and maintain neutral trunk alignment.  x10 lateral roll out each side.  Therapist min assist at pelvis to shift weight with movement.  x10 RUE overhead reach. Therapist provided target for reach towards with intension to shift pelvis and rotate at trunk   Rest breaks needed between each exercise.     Manual Therapy   Manual Therapy Other (comment)    Manual  therapy comments Bilateral hip flexion stretch, IR/ER, and lumbar rotation   Pt presented with feelings of stiff back following tall kneeling exercises which was alleviated with passive stretches                 PT Education - 02/05/21 1125    Education Details Weightshifting, trunk and pelvic rotation during exercises    Person(s) Educated Patient;Parent(s)    Methods Explanation    Comprehension Verbalized understanding            PT Short Term Goals - 01/27/21 1431      PT SHORT TERM GOAL #1   Title Pt will demonstrate compliance with initial HEP with supervision of parents    Time 4    Period Weeks    Status Achieved    Target Date 01/28/21      PT SHORT TERM GOAL #2   Title Pt will demonstrate improved safety with transfers and will decrease time to perform five time sit to  stand to 18 seconds with min A    Time 4    Period Weeks    Status Achieved    Target Date 01/28/21      PT SHORT TERM GOAL #3   Title Pt will improve BERG to >/= 30/56 to indicate decreased falls risk    Baseline 32/56    Time 4    Period Weeks    Status Achieved    Target Date 01/28/21      PT SHORT TERM GOAL #4   Title Pt will demonstrate decreased falls risk when ambulating with cane as indicated by increase in DGI to >/= 11/24    Baseline 9/24    Time 4    Period Weeks    Status Partially Met    Target Date 01/28/21             PT Long Term Goals - 12/29/20 1759      PT LONG TERM GOAL #1   Title Pt will demonstrate ability to perform final HEP and walking program with supervision    Time 8    Period Weeks    Status New    Target Date 02/27/21      PT LONG TERM GOAL #2   Title Pt will report improvement in LE function on FOTO to >/= 66%    Baseline 54%    Time 8    Period Weeks    Status New    Target Date 02/27/21      PT LONG TERM GOAL #3   Title Pt will decrease five time sit to stand to </= 15 seconds with supervision    Time 8    Period Weeks    Status New    Target Date 02/27/21      PT LONG TERM GOAL #4   Title Pt will increase BERG to >/= 40/56    Time 8    Period Weeks    Status New    Target Date 02/27/21      PT LONG TERM GOAL #5   Title Pt will increase DGI to >/= 18/24    Time 8    Period Weeks    Status New    Target Date 02/27/21      PT LONG TERM GOAL #6   Title Pt will demonstrate ability to safely stand from the floor MOD I with use of UE    Time 8    Period Weeks  Status New    Target Date 02/27/21      PT LONG TERM GOAL #7   Title Pt will ambulate x 500' outside with LRAD and supervision and will negotiate 12 stairs with R rail (ascending) with supervision to be able to access upstairs bedroom    Time 8    Period Weeks    Status New    Target Date 02/27/21                 Plan - 02/05/21 1136     Clinical Impression Statement Continued progression of neuromuscular re-education exercises focusing on reorientation to midline.  Pt presents with significant difficulty of postural awareness when performing more complex, multistep movements.  Co-activation of UE with pelvic and trunk movement aided in facilation of proper movement patterning.  Pt benefited from tall kneeling exercises but he experienced rapid fatigue due to high involvement of hip musculature.  Pt is making excellent progress and is demonstrating improved safety and awareness with mobility; pt will likely progress to ambulating with cane with family next week.  Will begin to assess progress towards LTG within the next two weeks and pt will likely benefit from ongoing PT visits due to ongoing progress.    Personal Factors and Comorbidities Comorbidity 3+;Past/Current Experience;Social Background    Comorbidities Epilepsy, malignant neoplasm of brain, astrocytoma brain tumor, thyroid disease, brain surgery, lives with elderly parents    Examination-Activity Limitations [place];Locomotion Level;Stairs;Transfers;Bathing;Toileting    Examination-Participation Restrictions Community Activity;Meal Prep;Shop    Stability/Clinical Decision Making Evolving/Moderate complexity    Rehab Potential Good    PT Frequency 2x / week    PT Duration 8 weeks    PT Treatment/Interventions ADLs/Self Care Home Management;Aquatic Therapy;DME Instruction;Gait training;Cognitive remediation;Stair training;Patient/family education;Functional mobility training;Orthotic Fit/Training;Therapeutic activities;Therapeutic exercise;Balance training;Neuromuscular re-education;Passive range of motion;Vestibular;Visual/perceptual remediation/compensation    PT Next Visit Plan Continued progression: controlling sit <> stand LLE weightbearing emphasis; resisted sit <> stand, NMR/WB focusing on attention to L, tall kneeling, balance when turning, reorientation to midline/weight  shifting/head and trunk righting.  Gait training/safety with cane; stair negotiation - work on weaning off of wheelchair.    PT Home Exercise Plan Access Code: [email or web address]    Consulted and Agree with Plan of Care Patient;Family member/caregiver    Family Member Consulted parents           Patient will benefit from skilled therapeutic intervention in order to improve the following deficits and impairments:  Abnormal gait,Decreased balance,Decreased cognition,Decreased coordination,Difficulty walking,Impaired tone,Impaired UE functional use,Impaired vision/preception,Postural dysfunction,Pain  Visit Diagnosis: Hemiplegia and hemiparesis following other cerebrovascular disease affecting left non-dominant side ([place])  Unsteadiness on feet  Muscle weakness (generalized)  Other abnormalities of gait and mobility  Repeated falls  Other symptoms and signs involving the nervous system     Problem List Patient Active Problem List   Diagnosis Date Noted  . Slow transit constipation   . Sleep disturbance   . Basal ganglia stroke ([place])   . Incontinence of feces   . Leukocytosis   . Seizures ([place])   . Spastic hemiparesis ([place])   . Cerebrovascular accident (CVA) of right basal ganglia ([place]) 09/30/2020  . CVA (cerebral vascular accident) ([place]) 09/26/2020  . Gait disorder 10/12/2016  . Memory loss 10/12/2016  . Epilepsy ([place]) 03/30/2013  . Malignant neoplasm of brain ([place]) 03/30/2013  . Astrocytoma brain tumor ([place]) 03/27/2012    Yetta Numbers, SPT 02/05/2021, 11:43 AM  [facility] [address] [place],  [place], [address] Phone: [phone]   Fax:  [phone]  Name: Dhruvan Gullion MRN: [id number] Date of Birth: 1961-05-31

## 2021-02-06 DIAGNOSIS — F329 Major depressive disorder, single episode, unspecified: Secondary | ICD-10-CM | POA: Diagnosis not present

## 2021-02-06 DIAGNOSIS — J40 Bronchitis, not specified as acute or chronic: Secondary | ICD-10-CM | POA: Diagnosis not present

## 2021-02-06 DIAGNOSIS — E039 Hypothyroidism, unspecified: Secondary | ICD-10-CM | POA: Diagnosis not present

## 2021-02-06 DIAGNOSIS — F331 Major depressive disorder, recurrent, moderate: Secondary | ICD-10-CM | POA: Diagnosis not present

## 2021-02-06 DIAGNOSIS — G4700 Insomnia, unspecified: Secondary | ICD-10-CM | POA: Diagnosis not present

## 2021-02-06 DIAGNOSIS — F322 Major depressive disorder, single episode, severe without psychotic features: Secondary | ICD-10-CM | POA: Diagnosis not present

## 2021-02-06 DIAGNOSIS — F3341 Major depressive disorder, recurrent, in partial remission: Secondary | ICD-10-CM | POA: Diagnosis not present

## 2021-02-09 ENCOUNTER — Ambulatory Visit: Payer: PPO | Admitting: Occupational Therapy

## 2021-02-09 ENCOUNTER — Encounter: Payer: Self-pay | Admitting: Occupational Therapy

## 2021-02-09 DIAGNOSIS — R2681 Unsteadiness on feet: Secondary | ICD-10-CM | POA: Diagnosis not present

## 2021-02-09 NOTE — Therapy (Signed)
[facility] [address] [place], [place], [address] Phone: [phone]   Fax:  [phone]  Occupational Therapy Treatment  Patient Details  Name: Howard White MRN: [id number] Date of Birth: [birth year] Referring Provider (OT): Alysia Penna, MD   Encounter Date: 02/09/2021   OT End of Session - 02/09/21 1800    Visit Number 11    Number of Visits 25    Date for OT Re-Evaluation 03/23/21    Authorization Type [employer]    Authorization Time Period $15 copay for OT, VL:MN    OT Start Time 1330    OT Stop Time 1415    OT Time Calculation (min) 45 min    Activity Tolerance Patient tolerated treatment well    Behavior During Therapy [facility] for tasks assessed/performed           Past Medical History:  Diagnosis Date  . Brain cancer ([place])   . Seizures ([place])   . Thyroid disease    Hypothyroid    Past Surgical History:  Procedure Laterality Date  . brain cancer    . BRAIN SURGERY      There were no vitals filed for this visit.   Subjective Assessment - 02/09/21 1759    Subjective  Good sore - like a workout    Patient is accompanied by: Family member    Pertinent History Brain Tumor 2001. Hx of Seizures    Limitations Seizures. Fall Risk    Patient Stated Goals Walk again and use a cane. "foot, hand and balance"    Currently in Pain? No/denies    Pain Score 0-No pain           Patient seen for aquatic therapy this afternoon.  Emphasis on reducing tension in LUE while challenging balance in standing.  Worked on forward weight shift with improved postural alignment, and decreased compensatory strategies.  Patient showing improved safety by reducing speed of movement and also by recognizing ineffective movement patterns that may lead to loss of balance.  Working to improve elbow extension with wrist and digit extension in sitting than in standing and during transitional movements.                          OT Short Term Goals - 02/09/21 1805      OT SHORT TERM GOAL #1   Title Pt will be independent with initial HEP 01/26/2021    Time 4    Period Weeks    Status On-going    Target Date 01/26/21      OT SHORT TERM GOAL #2   Title Pt will improve wrist extension in LUE to 35 degrees for improving functional use of LUE.    Baseline LUE 30 passive - unable to maintain position actively.    Time 4    Period Weeks    Status On-going      OT SHORT TERM GOAL #3   Title Pt will improve attention, balance and range of motion in order to complete toilet hygiene with no physical assistance.    Time 4    Period Weeks    Status On-going      OT SHORT TERM GOAL #4   Title Pt will perform home management and/or simple meal prep with supervision and good safety awareness    Time 4    Period Weeks    Status On-going      OT SHORT TERM GOAL #5  Title Pt will perform all basic ADLs without physical assistance.    Time 4    Period Weeks    Status On-going      OT SHORT TERM GOAL #6   Title Pt will improve functional use of LUE by scoring 25 or greater on Box and Blocks with LUE.    Baseline RUE 41, LUE 18    Time 4    Period Weeks    Status On-going             OT Long Term Goals - 02/09/21 1805      OT LONG TERM GOAL #1   Title Pt will be independent with updated HEP    Time 12    Period Weeks    Status On-going      OT LONG TERM GOAL #2   Title Pt will complete environmental scanning with 95% accuracy.    Time 12    Period Weeks    Status On-going      OT LONG TERM GOAL #3   Title Pt will attend to a novel cognitive task for 15 minutes or greater in a moderately distracting environment with no cues for redirection.    Time 12    Period Weeks    Status On-going      OT LONG TERM GOAL #4   Title Pt will achieve wrist extension of 45 degrees or greater in LUE    Baseline 30*    Time 12    Period Weeks    Status On-going       OT LONG TERM GOAL #5   Title Pt will improve grip strength in LUE to 30 lbs or greater for increase in ability to complete clothing management and other functional tasks with LUE.    Baseline LUE 18 RUE 41    Time 12    Period Weeks    Status On-going      OT LONG TERM GOAL #6   Title Pt will improve fine motor coordination in LUE by completing 9 hole peg test in 2 minutes or less    Baseline 2 pegs in 2 minutes    Time 12    Period Weeks    Status On-going      OT LONG TERM GOAL #7   Title Pt will complete FOTO And score 66% or greater.    Baseline 54%    Time 12    Period Weeks    Status On-going                 Plan - 02/09/21 1801    Clinical Impression Statement Pt showing improved ability to transfer weight forward over feet.    OT Occupational Profile and History Detailed Assessment- Review of Records and additional review of physical, cognitive, psychosocial history related to current functional performance    Occupational performance deficits (Please refer to evaluation for details): ADL's    Body Structure / Function / Physical Skills ADL;ROM;UE functional use;FMC;Decreased knowledge of use of DME;Balance;Vision;Dexterity;GMC;Coordination;IADL;Proprioception;Tone;Strength;Pain;Endurance    Cognitive Skills Attention;Safety Awareness;Sequencing;Orientation;Memory;Understand;Problem Solve    Rehab Potential Good    Clinical Decision Making Several treatment options, min-mod task modification necessary    Comorbidities Affecting Occupational Performance: May have comorbidities impacting occupational performance    Modification or Assistance to Complete Evaluation  No modification of tasks or assist necessary to complete eval    OT Frequency 2x / week    OT Duration 12 weeks   24 visits + eval  OT Treatment/Interventions Self-care/ADL training;Therapeutic exercise;Visual/perceptual remediation/compensation;Patient/family education;Splinting;Neuromuscular  education;Moist Heat;Aquatic Therapy;DME and/or AE instruction;Passive range of motion;Therapist, nutritional;Therapeutic activities;Energy conservation;Electrical Stimulation;Manual Therapy    Plan Review initial HEP and add/update prn, aquatics for postural realignment and activation, balance, gross motor coordiantion, and LUE fuctional ROM.    Recommended Other Services consider ST?    Consulted and Agree with Plan of Care Patient;Family member/caregiver   mom and dad   Family Member Consulted Mom and Dad           Patient will benefit from skilled therapeutic intervention in order to improve the following deficits and impairments:   Body Structure / Function / Physical Skills: ADL,ROM,UE functional use,FMC,Decreased knowledge of use of DME,Balance,Vision,Dexterity,GMC,Coordination,IADL,Proprioception,Tone,Strength,Pain,Endurance Cognitive Skills: Attention,Safety Awareness,Sequencing,Orientation,Memory,Understand,Problem Solve     Visit Diagnosis: Hemiplegia and hemiparesis following other cerebrovascular disease affecting left non-dominant side ([facility])  Unsteadiness on feet  Muscle weakness (generalized)  Other symptoms and signs involving the nervous system  Attention and concentration deficit  Visuospatial deficit  Stiffness of left shoulder, not elsewhere classified  Muscle weakness  Other lack of coordination    Problem List Patient Active Problem List   Diagnosis Date Noted  . Slow transit constipation   . Sleep disturbance   . Basal ganglia stroke ([place])   . Incontinence of feces   . Leukocytosis   . Seizures ([place])   . Spastic hemiparesis ([place])   . Cerebrovascular accident (CVA) of right basal ganglia ([place]) 09/30/2020  . CVA (cerebral vascular accident) ([place]) 09/26/2020  . Gait disorder 10/12/2016  . Memory loss 10/12/2016  . Epilepsy ([place]) 03/30/2013  . Malignant neoplasm of brain ([place]) 03/30/2013  . Astrocytoma brain tumor [facility]) 03/27/2012     Mariah Milling, OTR/L 02/09/2021, 6:07 PM  [facility] [address] [place], [place], [address] Phone: [phone]   Fax:  [phone]  Name: Howard White MRN: [id number] Date of Birth: March 02, 1961

## 2021-02-10 ENCOUNTER — Encounter: Payer: Self-pay | Admitting: Occupational Therapy

## 2021-02-10 ENCOUNTER — Ambulatory Visit: Payer: PPO | Admitting: Occupational Therapy

## 2021-02-10 ENCOUNTER — Other Ambulatory Visit: Payer: Self-pay

## 2021-02-10 ENCOUNTER — Ambulatory Visit: Payer: PPO | Admitting: Physical Therapy

## 2021-02-10 ENCOUNTER — Encounter: Payer: Self-pay | Admitting: Physical Therapy

## 2021-02-10 DIAGNOSIS — M25612 Stiffness of left shoulder, not elsewhere classified: Secondary | ICD-10-CM

## 2021-02-10 DIAGNOSIS — R41842 Visuospatial deficit: Secondary | ICD-10-CM

## 2021-02-10 DIAGNOSIS — R278 Other lack of coordination: Secondary | ICD-10-CM

## 2021-02-10 DIAGNOSIS — R2681 Unsteadiness on feet: Secondary | ICD-10-CM

## 2021-02-10 DIAGNOSIS — I69854 Hemiplegia and hemiparesis following other cerebrovascular disease affecting left non-dominant side: Secondary | ICD-10-CM

## 2021-02-10 DIAGNOSIS — R29818 Other symptoms and signs involving the nervous system: Secondary | ICD-10-CM

## 2021-02-10 DIAGNOSIS — R2689 Other abnormalities of gait and mobility: Secondary | ICD-10-CM

## 2021-02-10 DIAGNOSIS — M6281 Muscle weakness (generalized): Secondary | ICD-10-CM

## 2021-02-10 DIAGNOSIS — R41840 Attention and concentration deficit: Secondary | ICD-10-CM

## 2021-02-10 DIAGNOSIS — R296 Repeated falls: Secondary | ICD-10-CM

## 2021-02-10 NOTE — Therapy (Signed)
[facility] [address] [place], [place], [address] Phone: [phone]   Fax:  [phone]  Physical Therapy Treatment  Patient Details  Name: Howard White MRN: [id number] Date of Birth: [birth year] Referring Provider (PT): Charlett Blake, MD   Encounter Date: 02/10/2021   PT End of Session - 02/10/21 2109    Visit Number 11    Number of Visits 13    Date for PT Re-Evaluation 02/27/21    Authorization Type HTA - 10th visit PN    Progress Note Due on Visit 20    PT Start Time 1230    PT Stop Time 1315    PT Time Calculation (min) 45 min    Equipment Utilized During Treatment Other (comment)   sports cord   Activity Tolerance Patient tolerated treatment well    Behavior During Therapy Impulsive;WFL for tasks assessed/performed           Past Medical History:  Diagnosis Date  . Brain cancer ([place])   . Seizures ([place])   . Thyroid disease    Hypothyroid    Past Surgical History:  Procedure Laterality Date  . brain cancer    . BRAIN SURGERY      There were no vitals filed for this visit.   Subjective Assessment - 02/10/21 1234    Subjective Continuing to work on LUE relaxation in the pool; LUE is a little sore today.  Slept well last night.    Patient is accompained by: Family member   mom   Pertinent History Epilepsy, malignant neoplasm of brain, astrocytoma brain tumor, thyroid disease, brain surgery    Limitations Standing;Walking    Patient Stated Goals Work on his balance and be able to walk by himself.    Currently in Pain? Yes                             [facility] Adult PT Treatment/Exercise - 02/10/21 1235      Transfers   Transfers Sit to Stand;Stand to [employer] Transfers    Sit to Stand 5: Supervision    Sit to Stand Details (indicate cue type and reason) from w/c or mat with out AD; performed with improved WB through bilat LE and improved control    Stand to Sit 5:  Supervision    Stand to Sit Details improved eccentric control during stand > sit    Stand Pivot Transfers 5: Supervision    Stand Pivot Transfer Details (indicate cue type and reason) w/c <> mat without AD, close supervision.  Improved control and balance during pivot      Ambulation/Gait   Ambulation/Gait Yes    Ambulation/Gait Assistance 4: Min assist    Ambulation/Gait Assistance Details with Hurrycane with therapist on L side providing tactile input for weight shift to LLE during stance, cues to bring head to midline and cues for increased step length.  Intermittent assistance to maintain balance as pt fatigued    Ambulation Distance (Feet) 50 Feet    Assistive device Straight cane    Gait Pattern Decreased stance time - left;Decreased step length - left;Decreased weight shift to left;Lateral trunk lean to right    Ambulation Surface Level;Indoor      Neuro Re-ed    Neuro Re-ed Details  Use of sports cord around pelvis pulling patient posteriorly to facilitate increased LLE activation and increased postural control while performing the following activities: sit <> stand from  chair with > without UE support with cues to bring head and trunk into midline during sit <> stand, to control stand > sit and to sit with bias to L.  Also performed forwards stepping/weight shifting progressing to walking x 20' pushing against resistance of band to ambulate forwards followed by controlled retro gait x 4-5 reps.  Changed to side stepping to R against resistance of band x 10-15' with tactile and verbal cues to maintain head in midline and to fully weight shift to LLE before advancing RLE; cued to stand and resist being pulled back to L by stabilizing through LLE; cued to side step back to L with increased control - performed x 2 reps.  Required mod A overall throughout activity.      Exercises   Exercises Knee/Hip      Knee/Hip Exercises: Supine   Bridges Strengthening;Both;1 set;10 reps    Bridges  Limitations performed bilat bridge with cues to initiate with LLE first and rotate L hip up and forwards for anterior rotation    Single Leg Bridge Strengthening;Left;1 set;10 reps;Limitations   bilat bridge > lifting RLE and hold x 5-10 seconds                   PT Short Term Goals - 01/27/21 1431      PT SHORT TERM GOAL #1   Title Pt will demonstrate compliance with initial HEP with supervision of parents    Time 4    Period Weeks    Status Achieved    Target Date 01/28/21      PT SHORT TERM GOAL #2   Title Pt will demonstrate improved safety with transfers and will decrease time to perform five time sit to stand to 18 seconds with min A    Time 4    Period Weeks    Status Achieved    Target Date 01/28/21      PT SHORT TERM GOAL #3   Title Pt will improve BERG to >/= 30/56 to indicate decreased falls risk    Baseline 32/56    Time 4    Period Weeks    Status Achieved    Target Date 01/28/21      PT SHORT TERM GOAL #4   Title Pt will demonstrate decreased falls risk when ambulating with cane as indicated by increase in DGI to >/= 11/24    Baseline 9/24    Time 4    Period Weeks    Status Partially Met    Target Date 01/28/21             PT Long Term Goals - 12/29/20 1759      PT LONG TERM GOAL #1   Title Pt will demonstrate ability to perform final HEP and walking program with supervision    Time 8    Period Weeks    Status New    Target Date 02/27/21      PT LONG TERM GOAL #2   Title Pt will report improvement in LE function on FOTO to >/= 66%    Baseline 54%    Time 8    Period Weeks    Status New    Target Date 02/27/21      PT LONG TERM GOAL #3   Title Pt will decrease five time sit to stand to </= 15 seconds with supervision    Time 8    Period Weeks    Status New    Target Date  02/27/21      PT LONG TERM GOAL #4   Title Pt will increase BERG to >/= 40/56    Time 8    Period Weeks    Status New    Target Date 02/27/21      PT  LONG TERM GOAL #5   Title Pt will increase DGI to >/= 18/24    Time 8    Period Weeks    Status New    Target Date 02/27/21      PT LONG TERM GOAL #6   Title Pt will demonstrate ability to safely stand from the floor MOD I with use of UE    Time 8    Period Weeks    Status New    Target Date 02/27/21      PT LONG TERM GOAL #7   Title Pt will ambulate x 500' outside with LRAD and supervision and will negotiate 12 stairs with R rail (ascending) with supervision to be able to access upstairs bedroom    Time 8    Period Weeks    Status New    Target Date 02/27/21                 Plan - 02/10/21 2109    Clinical Impression Statement Pt continues to demonstrate significant improvements in motor planning, sequencing, coordination and postural control within each session.  Utilized resistance of sports cord to continue to facilitate increased LLE activation and postural control.  Pt is demonstrating safer transfers and gait with cane; will begin training for gait at home with cane at next session.    Personal Factors and Comorbidities Comorbidity 3+;Past/Current Experience;Social Background    Comorbidities Epilepsy, malignant neoplasm of brain, astrocytoma brain tumor, thyroid disease, brain surgery, lives with elderly parents    Examination-Activity Limitations [name];Locomotion Level;Stairs;Transfers;Bathing;Toileting    Examination-Participation Restrictions Community Activity;Meal Prep;Shop    Stability/Clinical Decision Making Evolving/Moderate complexity    Rehab Potential Good    PT Frequency 2x / week    PT Duration 8 weeks    PT Treatment/Interventions ADLs/Self Care Home Management;Aquatic Therapy;DME Instruction;Gait training;Cognitive remediation;Stair training;Patient/family education;Functional mobility training;Orthotic Fit/Training;Therapeutic activities;Therapeutic exercise;Balance training;Neuromuscular re-education;Passive range of motion;Vestibular;Visual/perceptual  remediation/compensation    PT Next Visit Plan Begin to train pt and dad for walking at home with hurrycane; Gait training/safety with cane; stair negotiation - work on weaning off of wheelchair.  resisted sit <> stand, NMR/WB focusing on attention to L, tall kneeling, balance when turning, reorientation to midline/weight shifting/head and trunk righting.    PT Home Exercise Plan Access Code: [email or web address]    Consulted and Agree with Plan of Care Patient;Family member/caregiver    Family Member Consulted parents           Patient will benefit from skilled therapeutic intervention in order to improve the following deficits and impairments:  Abnormal gait,Decreased balance,Decreased cognition,Decreased coordination,Difficulty walking,Impaired tone,Impaired UE functional use,Impaired vision/preception,Postural dysfunction,Pain  Visit Diagnosis: Repeated falls  Other abnormalities of gait and mobility  Unsteadiness on feet  Other symptoms and signs involving the nervous system  Other lack of coordination  Hemiplegia and hemiparesis following other cerebrovascular disease affecting left non-dominant side [facility])     Problem List Patient Active Problem List   Diagnosis Date Noted  . Slow transit constipation   . Sleep disturbance   . Basal ganglia stroke ([place])   . Incontinence of feces   . Leukocytosis   . Seizures ([place])   . Spastic hemiparesis ([place])   .  Cerebrovascular accident (CVA) of right basal ganglia ([place]) 09/30/2020  . CVA (cerebral vascular accident) ([place]) 09/26/2020  . Gait disorder 10/12/2016  . Memory loss 10/12/2016  . Epilepsy ([place]) 03/30/2013  . Malignant neoplasm of brain ([place]) 03/30/2013  . Astrocytoma brain tumor ([place]) 03/27/2012    Rico Junker, PT, DPT 02/10/21    9:15 PM    [facility] [address] [place], [place], [address] Phone: [phone]   Fax:  [phone]  Name: Norman Piacentini MRN:  [id number] Date of Birth: [birth year]

## 2021-02-10 NOTE — Therapy (Signed)
[facility] [address] [place], [place], [address] Phone: [phone]   Fax:  [phone]  Occupational Therapy Treatment  Patient Details  Name: Howard White MRN: [id number] Date of Birth: [birth year] Referring Provider (OT): Alysia Penna, MD   Encounter Date: 02/10/2021   OT End of Session - 02/10/21 1151    Visit Number 12    Number of Visits 25    Date for OT Re-Evaluation 03/23/21    Authorization Type [employer]    Authorization Time Period $15 copay for OT, VL:MN    OT Start Time 1150    OT Stop Time 1230    OT Time Calculation (min) 40 min    Activity Tolerance Patient tolerated treatment well    Behavior During Therapy [facility] for tasks assessed/performed           Past Medical History:  Diagnosis Date  . Brain cancer ([place])   . Seizures ([place])   . Thyroid disease    Hypothyroid    Past Surgical History:  Procedure Laterality Date  . brain cancer    . BRAIN SURGERY      There were no vitals filed for this visit.   Subjective Assessment - 02/10/21 1151    Subjective  "I tried to hold a plate and I have good strength but my coordination needs some work"    Patient is accompanied by: Family member    Pertinent History Brain Tumor 2001. Hx of Seizures    Limitations Seizures. Fall Risk    Patient Stated Goals Walk again and use a cane. "foot, hand and balance"    Currently in Pain? No/denies               TREATMENT:  Feeding: self feeding with holding bowl in LUE hand and spooning out beans into container with RUE. Pt reports difficulty holding plate with LUE and feeding self with RUE at party this weekend.   PVC pipe tree:  Pt had moderate difficulty with orientation of pieces and max difficulty with coordination for bimanual and placing pieces in correctly and with good stability. Pt complete Fig 8 with mod/max difficulty.  Left Attention: worked on holding plate while  seated edge of mat with items rolling while multitasking with high fiving therapist at various heights and subtracting by 5's from 50 with inattention in the left slightly with sloping plate.                   OT Short Term Goals - 02/09/21 1805      OT SHORT TERM GOAL #1   Title Pt will be independent with initial HEP 01/26/2021    Time 4    Period Weeks    Status On-going    Target Date 01/26/21      OT SHORT TERM GOAL #2   Title Pt will improve wrist extension in LUE to 35 degrees for improving functional use of LUE.    Baseline LUE 30 passive - unable to maintain position actively.    Time 4    Period Weeks    Status On-going      OT SHORT TERM GOAL #3   Title Pt will improve attention, balance and range of motion in order to complete toilet hygiene with no physical assistance.    Time 4    Period Weeks    Status On-going      OT SHORT TERM GOAL #4   Title Pt will perform home  management and/or simple meal prep with supervision and good safety awareness    Time 4    Period Weeks    Status On-going      OT SHORT TERM GOAL #5   Title Pt will perform all basic ADLs without physical assistance.    Time 4    Period Weeks    Status On-going      OT SHORT TERM GOAL #6   Title Pt will improve functional use of LUE by scoring 25 or greater on Box and Blocks with LUE.    Baseline RUE 41, LUE 18    Time 4    Period Weeks    Status On-going             OT Long Term Goals - 02/09/21 1805      OT LONG TERM GOAL #1   Title Pt will be independent with updated HEP    Time 12    Period Weeks    Status On-going      OT LONG TERM GOAL #2   Title Pt will complete environmental scanning with 95% accuracy.    Time 12    Period Weeks    Status On-going      OT LONG TERM GOAL #3   Title Pt will attend to a novel cognitive task for 15 minutes or greater in a moderately distracting environment with no cues for redirection.    Time 12    Period Weeks    Status  On-going      OT LONG TERM GOAL #4   Title Pt will achieve wrist extension of 45 degrees or greater in LUE    Baseline 30*    Time 12    Period Weeks    Status On-going      OT LONG TERM GOAL #5   Title Pt will improve grip strength in LUE to 30 lbs or greater for increase in ability to complete clothing management and other functional tasks with LUE.    Baseline LUE 18 RUE 41    Time 12    Period Weeks    Status On-going      OT LONG TERM GOAL #6   Title Pt will improve fine motor coordination in LUE by completing 9 hole peg test in 2 minutes or less    Baseline 2 pegs in 2 minutes    Time 12    Period Weeks    Status On-going      OT LONG TERM GOAL #7   Title Pt will complete FOTO And score 66% or greater.    Baseline 54%    Time 12    Period Weeks    Status On-going                 Plan - 02/10/21 1315    Clinical Impression Statement Pt is improving with bimanual coordination and attention. Pt continues to progress towards goals.    OT Occupational Profile and History Detailed Assessment- Review of Records and additional review of physical, cognitive, psychosocial history related to current functional performance    Occupational performance deficits (Please refer to evaluation for details): ADL's    Body Structure / Function / Physical Skills ADL;ROM;UE functional use;FMC;Decreased knowledge of use of DME;Balance;Vision;Dexterity;GMC;Coordination;IADL;Proprioception;Tone;Strength;Pain;Endurance    Cognitive Skills Attention;Safety Awareness;Sequencing;Orientation;Memory;Understand;Problem Solve    Rehab Potential Good    Clinical Decision Making Several treatment options, min-mod task modification necessary    Comorbidities Affecting Occupational Performance: May have comorbidities impacting occupational  performance    Modification or Assistance to Complete Evaluation  No modification of tasks or assist necessary to complete eval    OT Frequency 2x / week    OT  Duration 12 weeks   24 visits + eval   OT Treatment/Interventions Self-care/ADL training;Therapeutic exercise;Visual/perceptual remediation/compensation;Patient/family education;Splinting;Neuromuscular education;Moist Heat;Aquatic Therapy;DME and/or AE instruction;Passive range of motion;Therapist, nutritional;Therapeutic activities;Energy conservation;Electrical Stimulation;Manual Therapy    Plan Review initial HEP and add/update prn, aquatics for postural realignment and activation, balance, gross motor coordiantion, and LUE fuctional ROM.    Recommended Other Services consider ST?    Consulted and Agree with Plan of Care Patient;Family member/caregiver   mom and dad   Family Member Consulted Mom and Dad           Patient will benefit from skilled therapeutic intervention in order to improve the following deficits and impairments:   Body Structure / Function / Physical Skills: ADL,ROM,UE functional use,FMC,Decreased knowledge of use of DME,Balance,Vision,Dexterity,GMC,Coordination,IADL,Proprioception,Tone,Strength,Pain,Endurance Cognitive Skills: Attention,Safety Awareness,Sequencing,Orientation,Memory,Understand,Problem Solve     Visit Diagnosis: Hemiplegia and hemiparesis following other cerebrovascular disease affecting left non-dominant side ([facility])  Unsteadiness on feet  Muscle weakness (generalized)  Other symptoms and signs involving the nervous system  Attention and concentration deficit  Visuospatial deficit  Stiffness of left shoulder, not elsewhere classified  Muscle weakness  Other lack of coordination    Problem List Patient Active Problem List   Diagnosis Date Noted  . Slow transit constipation   . Sleep disturbance   . Basal ganglia stroke ([place])   . Incontinence of feces   . Leukocytosis   . Seizures ([place])   . Spastic hemiparesis ([place])   . Cerebrovascular accident (CVA) of right basal ganglia ([place]) 09/30/2020  . CVA (cerebral vascular accident)  ([place]) 09/26/2020  . Gait disorder 10/12/2016  . Memory loss 10/12/2016  . Epilepsy ([place]) 03/30/2013  . Malignant neoplasm of brain ([place]) 03/30/2013  . Astrocytoma brain tumor [facility]) 03/27/2012    Zachery Conch MOT, OTR/L  02/10/2021, 1:17 PM  [facility] [address] [place], [place], [address] Phone: [phone]   Fax:  [phone]  Name: Lanson Randle MRN: [id number] Date of Birth: [birth year]

## 2021-02-12 ENCOUNTER — Encounter: Payer: PPO | Admitting: Occupational Therapy

## 2021-02-12 ENCOUNTER — Other Ambulatory Visit: Payer: Self-pay

## 2021-02-12 ENCOUNTER — Ambulatory Visit: Payer: PPO | Admitting: Physical Therapy

## 2021-02-12 ENCOUNTER — Encounter: Payer: Self-pay | Admitting: Physical Therapy

## 2021-02-12 DIAGNOSIS — R2681 Unsteadiness on feet: Secondary | ICD-10-CM

## 2021-02-12 DIAGNOSIS — M6281 Muscle weakness (generalized): Secondary | ICD-10-CM

## 2021-02-12 DIAGNOSIS — R29818 Other symptoms and signs involving the nervous system: Secondary | ICD-10-CM

## 2021-02-12 DIAGNOSIS — R278 Other lack of coordination: Secondary | ICD-10-CM

## 2021-02-12 DIAGNOSIS — I69854 Hemiplegia and hemiparesis following other cerebrovascular disease affecting left non-dominant side: Secondary | ICD-10-CM

## 2021-02-12 DIAGNOSIS — R296 Repeated falls: Secondary | ICD-10-CM

## 2021-02-12 NOTE — Therapy (Signed)
[facility] [address] [place], [place], [address] Phone: [phone]   Fax:  [phone]  Physical Therapy Treatment  Patient Details  Name: Howard White MRN: [id number] Date of Birth: 12/05/1960 Referring Provider (PT): Kirsteins, Andrew E, MD   Encounter Date: 02/12/2021   PT End of Session - 02/12/21 1242    Visit Number 12    Number of Visits 13    Date for PT Re-Evaluation 02/27/21    Authorization Type HTA - 10th visit PN    Progress Note Due on Visit 20    PT Start Time 1150    PT Stop Time 1230    PT Time Calculation (min) 40 min    Equipment Utilized During Treatment Gait belt    Activity Tolerance Patient tolerated treatment well    Behavior During Therapy Impulsive;WFL for tasks assessed/performed           Past Medical History:  Diagnosis Date  . Brain cancer (HCC)   . Seizures (HCC)   . Thyroid disease    Hypothyroid    Past Surgical History:  Procedure Laterality Date  . brain cancer    . BRAIN SURGERY      There were no vitals filed for this visit.   Subjective Assessment - 02/12/21 1151    Subjective Pt reports being sore after last session.  No reports of soreness today.  He put biofreeze on his knees to help with some of the achiness.    Patient is accompained by: Family member   mom   Pertinent History Epilepsy, malignant neoplasm of brain, astrocytoma brain tumor, thyroid disease, brain surgery    Limitations Standing;Walking    Patient Stated Goals Work on his balance and be able to walk by himself.                             [facility] Adult PT Treatment/Exercise - 02/12/21 0001      Transfers   Transfers Sit to Stand    Sit to Stand 5: Supervision    Sit to Stand Details (indicate cue type and reason) x10 from mat; no verbal cueing needed to fix posture or weightshifting      Ambulation/Gait   Ambulation/Gait Yes    Ambulation/Gait Assistance 4: Min  assist    Ambulation/Gait Assistance Details with hurrycane; 30 ft walking backwards, practiced stopping and turning with control, verbal cueing needed to maintain proper gait patterning and posture, intermittent self-corrections.  Pt will increase gait speed or suddenly turn when there is a loss of balance.  Cued to stop and stand still when he begins to lose balance    Ambulation Distance (Feet) 300 Feet    Assistive device Straight cane    Gait Pattern Step-through pattern;Decreased arm swing - left;Decreased step length - left;Decreased stance time - left;Lateral trunk lean to right    Ambulation Surface Level;Indoor    Stairs Yes    Stairs Assistance 4: Min assist    Stairs Assistance Details (indicate cue type and reason) BUE support with rails; x10 each stepping to first stair, x8 each reciprocal patterning to 2nd step.  Verbal cueing needed to pattern step negotiation.  Significant lateral trunk lean to R noted, corrected with tactile and verbal cueing.                  PT Education - 02/12/21 1240    Education Details Instructed parents about   practicing walking at home with cane, tips to maintain balance, postural corrections and maintaining midline orientation, freezing after each transition    Person(s) Educated Patient    Methods Explanation;Handout    Comprehension Verbalized understanding            PT Short Term Goals - 01/27/21 1431      PT SHORT TERM GOAL #1   Title Pt will demonstrate compliance with initial HEP with supervision of parents    Time 4    Period Weeks    Status Achieved    Target Date 01/28/21      PT SHORT TERM GOAL #2   Title Pt will demonstrate improved safety with transfers and will decrease time to perform five time sit to stand to 18 seconds with min A    Time 4    Period Weeks    Status Achieved    Target Date 01/28/21      PT SHORT TERM GOAL #3   Title Pt will improve BERG to >/= 30/56 to indicate decreased falls risk    Baseline  32/56    Time 4    Period Weeks    Status Achieved    Target Date 01/28/21      PT SHORT TERM GOAL #4   Title Pt will demonstrate decreased falls risk when ambulating with cane as indicated by increase in DGI to >/= 11/24    Baseline 9/24    Time 4    Period Weeks    Status Partially Met    Target Date 01/28/21             PT Long Term Goals - 12/29/20 1759      PT LONG TERM GOAL #1   Title Pt will demonstrate ability to perform final HEP and walking program with supervision    Time 8    Period Weeks    Status New    Target Date 02/27/21      PT LONG TERM GOAL #2   Title Pt will report improvement in LE function on FOTO to >/= 66%    Baseline 54%    Time 8    Period Weeks    Status New    Target Date 02/27/21      PT LONG TERM GOAL #3   Title Pt will decrease five time sit to stand to </= 15 seconds with supervision    Time 8    Period Weeks    Status New    Target Date 02/27/21      PT LONG TERM GOAL #4   Title Pt will increase BERG to >/= 40/56    Time 8    Period Weeks    Status New    Target Date 02/27/21      PT LONG TERM GOAL #5   Title Pt will increase DGI to >/= 18/24    Time 8    Period Weeks    Status New    Target Date 02/27/21      PT LONG TERM GOAL #6   Title Pt will demonstrate ability to safely stand from the floor MOD I with use of UE    Time 8    Period Weeks    Status New    Target Date 02/27/21      PT LONG TERM GOAL #7   Title Pt will ambulate x 500' outside with LRAD and supervision and will negotiate 12 stairs with R rail (ascending) with supervision   to be able to access upstairs bedroom    Time 8    Period Weeks    Status New    Target Date 02/27/21                 Plan - 02/12/21 1243    Clinical Impression Statement Educated pt and parents about beginning to walk at home in kitchen with [facility] and supervision.  Gait training focused on orientation to midline, AD patterning, and techniques to incorporate when pt  begins to lose balance.  Pt able to perform step negotiation exercises but required constant verbal cueing to maintain proper pattern.  Upon fatigue noticable differences in gait and pt displays significant difficulties to correct.    Personal Factors and Comorbidities Comorbidity 3+;Past/Current Experience;Social Background    Comorbidities Epilepsy, malignant neoplasm of brain, astrocytoma brain tumor, thyroid disease, brain surgery, lives with elderly parents    Examination-Activity Limitations [place];Locomotion Level;Stairs;Transfers;Bathing;Toileting    Examination-Participation Restrictions Community Activity;Meal Prep;Shop    Stability/Clinical Decision Making Evolving/Moderate complexity    Rehab Potential Good    PT Frequency 2x / week    PT Duration 8 weeks    PT Treatment/Interventions ADLs/Self Care Home Management;Aquatic Therapy;DME Instruction;Gait training;Cognitive remediation;Stair training;Patient/family education;Functional mobility training;Orthotic Fit/Training;Therapeutic activities;Therapeutic exercise;Balance training;Neuromuscular re-education;Passive range of motion;Vestibular;Visual/perceptual remediation/compensation    PT Next Visit Plan Recert.  How is walking at home going? Gait training/safety with cane; stair negotiation - work on weaning off of wheelchair.  resisted sit <> stand, NMR/WB focusing on attention to L, tall kneeling, balance when turning, reorientation to midline/weight shifting/head and trunk righting.    PT Home Exercise Plan Access Code: [email or web address]    Consulted and Agree with Plan of Care Patient;Family member/caregiver    Family Member Consulted parents           Patient will benefit from skilled therapeutic intervention in order to improve the following deficits and impairments:  Abnormal gait,Decreased balance,Decreased cognition,Decreased coordination,Difficulty walking,Impaired tone,Impaired UE functional use,Impaired vision/preception,Postural  dysfunction,Pain  Visit Diagnosis: Hemiplegia and hemiparesis following other cerebrovascular disease affecting left non-dominant side ([place])  Unsteadiness on feet  Muscle weakness (generalized)  Other symptoms and signs involving the nervous system  Repeated falls  Other lack of coordination     Problem List Patient Active Problem List   Diagnosis Date Noted  . Slow transit constipation   . Sleep disturbance   . Basal ganglia stroke ([place])   . Incontinence of feces   . Leukocytosis   . Seizures ([place])   . Spastic hemiparesis ([place])   . Cerebrovascular accident (CVA) of right basal ganglia ([place]) 09/30/2020  . CVA (cerebral vascular accident) ([place]) 09/26/2020  . Gait disorder 10/12/2016  . Memory loss 10/12/2016  . Epilepsy ([place]) 03/30/2013  . Malignant neoplasm of brain ([place]) 03/30/2013  . Astrocytoma brain tumor [facility]) 03/27/2012    Yetta Numbers, SPT  02/12/2021, 12:50 PM  [facility] [address] [place], [place], [address] Phone: [phone]   Fax:  [phone]  Name: Eland Lamantia MRN: [id number] Date of Birth: [birth year]

## 2021-02-12 NOTE — Patient Instructions (Signed)
Walking at Home:  1. Walk only when you feel clear headed and have good energy.        Use a gait belt, Dad, stand on left side 2. Walk in the kitchen on smooth floor, try to stay in a straight path 3. Have Kinley bring his head in the middle and focus on an object in front of him while walking 4. When walking bring the cane forwards with the left leg. 5. STOP before a transition (turning, walking, sitting, standing) 6. If you lose your balance, just stop/FREEZE and reset

## 2021-02-16 ENCOUNTER — Ambulatory Visit: Payer: PPO | Attending: Physical Medicine & Rehabilitation | Admitting: Occupational Therapy

## 2021-02-16 ENCOUNTER — Encounter: Payer: Self-pay | Admitting: Occupational Therapy

## 2021-02-16 DIAGNOSIS — I69854 Hemiplegia and hemiparesis following other cerebrovascular disease affecting left non-dominant side: Secondary | ICD-10-CM | POA: Diagnosis not present

## 2021-02-16 DIAGNOSIS — M25612 Stiffness of left shoulder, not elsewhere classified: Secondary | ICD-10-CM | POA: Insufficient documentation

## 2021-02-16 DIAGNOSIS — R2681 Unsteadiness on feet: Secondary | ICD-10-CM | POA: Insufficient documentation

## 2021-02-16 DIAGNOSIS — M6281 Muscle weakness (generalized): Secondary | ICD-10-CM | POA: Insufficient documentation

## 2021-02-16 DIAGNOSIS — R2689 Other abnormalities of gait and mobility: Secondary | ICD-10-CM | POA: Diagnosis not present

## 2021-02-16 DIAGNOSIS — R41842 Visuospatial deficit: Secondary | ICD-10-CM | POA: Diagnosis not present

## 2021-02-16 DIAGNOSIS — R41840 Attention and concentration deficit: Secondary | ICD-10-CM | POA: Diagnosis not present

## 2021-02-16 DIAGNOSIS — R29818 Other symptoms and signs involving the nervous system: Secondary | ICD-10-CM | POA: Insufficient documentation

## 2021-02-16 DIAGNOSIS — R296 Repeated falls: Secondary | ICD-10-CM | POA: Diagnosis not present

## 2021-02-16 DIAGNOSIS — R278 Other lack of coordination: Secondary | ICD-10-CM | POA: Diagnosis not present

## 2021-02-16 NOTE — Therapy (Signed)
[facility] [address] [place], [place], [address] Phone: [phone]   Fax:  [phone]  Occupational Therapy Treatment  Patient Details  Name: Howard White MRN: [id number] Date of Birth: 19-May-1961 Referring Provider (OT): Alysia Penna, MD   Encounter Date: 02/16/2021   OT End of Session - 02/16/21 1429    Visit Number 13    Number of Visits 25    Date for OT Re-Evaluation 03/23/21    Authorization Type [employer]    Authorization Time Period $15 copay for OT, VL:MN    OT Start Time 1325    OT Stop Time 1415    OT Time Calculation (min) 50 min    Activity Tolerance Patient tolerated treatment well    Behavior During Therapy [facility] for tasks assessed/performed           Past Medical History:  Diagnosis Date  . Brain cancer ([place])   . Seizures ([place])   . Thyroid disease    Hypothyroid    Past Surgical History:  Procedure Laterality Date  . brain cancer    . BRAIN SURGERY      There were no vitals filed for this visit.   Subjective Assessment - 02/16/21 1429    Subjective  I think I am getting a little steadier    Patient is accompanied by: Family member    Pertinent History Brain Tumor 2001. Hx of Seizures    Limitations Seizures. Fall Risk    Patient Stated Goals Walk again and use a cane. "foot, hand and balance"    Currently in Pain? No/denies    Pain Score 0-No pain           Patient seen for aquatic therapy, entered and exited via steps and min assist.  Treatment occurred in 3.5-4.5 ft of water.  Worked on static and dynamic balance today in standing with continued focus on weight shifting forward onto feet, and maintaining weight onto left LE.  Patient with improved ability to recognize less than optimal balance before loss of balance, and Howard to self correct 8/10 times this session. Initiated modified Ai-chi exercise to address UE range of motion and challenge postural control  and balance.  Worked specifically to improve active elbow and wrist extension in LUE.                        OT Short Term Goals - 02/16/21 1431      OT SHORT TERM GOAL #1   Title Pt will be independent with initial HEP 01/26/2021    Time 4    Period Weeks    Status On-going    Target Date 01/26/21      OT SHORT TERM GOAL #2   Title Pt will improve wrist extension in LUE to 35 degrees for improving functional use of LUE.    Baseline LUE 30 passive - unable to maintain position actively.    Time 4    Period Weeks    Status Achieved      OT SHORT TERM GOAL #3   Title Pt will improve attention, balance and range of motion in order to complete toilet hygiene with no physical assistance.    Time 4    Period Weeks    Status On-going      OT SHORT TERM GOAL #4   Title Pt will perform home management and/or simple meal prep with supervision and good safety awareness  Time 4    Period Weeks    Status On-going      OT SHORT TERM GOAL #5   Title Pt will perform all basic ADLs without physical assistance.    Time 4    Period Weeks    Status On-going      OT SHORT TERM GOAL #6   Title Pt will improve functional use of LUE by scoring 25 or greater on Box and Blocks with LUE.    Baseline RUE 41, LUE 18    Time 4    Period Weeks    Status On-going             OT Long Term Goals - 02/16/21 1431      OT LONG TERM GOAL #1   Title Pt will be independent with updated HEP    Time 12    Period Weeks    Status On-going      OT LONG TERM GOAL #2   Title Pt will complete environmental scanning with 95% accuracy.    Time 12    Period Weeks    Status On-going      OT LONG TERM GOAL #3   Title Pt will attend to a novel cognitive task for 15 minutes or greater in a moderately distracting environment with no cues for redirection.    Time 12    Period Weeks    Status On-going      OT LONG TERM GOAL #4   Title Pt will achieve wrist extension of 45 degrees or  greater in LUE    Baseline 30*    Time 12    Period Weeks    Status On-going      OT LONG TERM GOAL #5   Title Pt will improve grip strength in LUE to 30 lbs or greater for increase in ability to complete clothing management and other functional tasks with LUE.    Baseline LUE 18 RUE 41    Time 12    Period Weeks    Status On-going      OT LONG TERM GOAL #6   Title Pt will improve fine motor coordination in LUE by completing 9 hole peg test in 2 minutes or less    Baseline 2 pegs in 2 minutes    Time 12    Period Weeks    Status On-going      OT LONG TERM GOAL #7   Title Pt will complete FOTO And score 66% or greater.    Baseline 54%    Time 12    Period Weeks    Status On-going                 Plan - 02/16/21 1430    Clinical Impression Statement Pt with reduced impulsivity, and imporved safety with functional mobility especiallyin water environment.    OT Occupational Profile and History Detailed Assessment- Review of Records and additional review of physical, cognitive, psychosocial history related to current functional performance    Occupational performance deficits (Please refer to evaluation for details): ADL's    Body Structure / Function / Physical Skills ADL;ROM;UE functional use;FMC;Decreased knowledge of use of DME;Balance;Vision;Dexterity;GMC;Coordination;IADL;Proprioception;Tone;Strength;Pain;Endurance    Cognitive Skills Attention;Safety Awareness;Sequencing;Orientation;Memory;Understand;Problem Solve    Rehab Potential Good    Clinical Decision Making Several treatment options, min-mod task modification necessary    Comorbidities Affecting Occupational Performance: May have comorbidities impacting occupational performance    Modification or Assistance to Complete Evaluation  No modification of tasks  or assist necessary to complete eval    OT Frequency 2x / week    OT Duration 12 weeks   24 visits + eval   OT Treatment/Interventions Self-care/ADL  training;Therapeutic exercise;Visual/perceptual remediation/compensation;Patient/family education;Splinting;Neuromuscular education;Moist Heat;Aquatic Therapy;DME and/or AE instruction;Passive range of motion;Therapist, nutritional;Therapeutic activities;Energy conservation;Electrical Stimulation;Manual Therapy    Plan Review initial HEP and add/update prn, aquatics for postural realignment and activation, balance, gross motor coordiantion, and LUE fuctional ROM.    Recommended Other Services consider ST?    Consulted and Agree with Plan of Care Patient;Family member/caregiver   mom and dad   Family Member Consulted Mom and Dad           Patient will benefit from skilled therapeutic intervention in order to improve the following deficits and impairments:   Body Structure / Function / Physical Skills: ADL,ROM,UE functional use,FMC,Decreased knowledge of use of DME,Balance,Vision,Dexterity,GMC,Coordination,IADL,Proprioception,Tone,Strength,Pain,Endurance Cognitive Skills: Attention,Safety Awareness,Sequencing,Orientation,Memory,Understand,Problem Solve     Visit Diagnosis: Hemiplegia and hemiparesis following other cerebrovascular disease affecting left non-dominant side (HCC)  Unsteadiness on feet  Muscle weakness (generalized)  Other symptoms and signs involving the nervous system  Other lack of coordination  Attention and concentration deficit  Visuospatial deficit  Stiffness of left shoulder, not elsewhere classified  Muscle weakness    Problem List Patient Active Problem List   Diagnosis Date Noted  . Slow transit constipation   . Sleep disturbance   . Basal ganglia stroke ([place])   . Incontinence of feces   . Leukocytosis   . Seizures ([place])   . Spastic hemiparesis ([place])   . Cerebrovascular accident (CVA) of right basal ganglia ([place]) 09/30/2020  . CVA (cerebral vascular accident) ([place]) 09/26/2020  . Gait disorder 10/12/2016  . Memory loss 10/12/2016  .  Epilepsy ([place]) 03/30/2013  . Malignant neoplasm of brain ([place]) 03/30/2013  . Astrocytoma brain tumor [facility]) 03/27/2012    Mariah Milling 02/16/2021, 2:32 PM  [facility] [address] [place], [place], [address] Phone: [phone]   Fax:  [phone]  Name: Hurley Sobel MRN: [id number] Date of Birth: 05/17/1961

## 2021-02-17 ENCOUNTER — Encounter: Payer: Self-pay | Admitting: Physical Therapy

## 2021-02-17 ENCOUNTER — Encounter: Payer: Self-pay | Admitting: Occupational Therapy

## 2021-02-17 ENCOUNTER — Ambulatory Visit: Payer: PPO | Admitting: Physical Therapy

## 2021-02-17 ENCOUNTER — Other Ambulatory Visit: Payer: Self-pay

## 2021-02-17 ENCOUNTER — Ambulatory Visit: Payer: PPO | Admitting: Occupational Therapy

## 2021-02-17 DIAGNOSIS — R278 Other lack of coordination: Secondary | ICD-10-CM

## 2021-02-17 DIAGNOSIS — R296 Repeated falls: Secondary | ICD-10-CM

## 2021-02-17 DIAGNOSIS — R41840 Attention and concentration deficit: Secondary | ICD-10-CM

## 2021-02-17 DIAGNOSIS — R29818 Other symptoms and signs involving the nervous system: Secondary | ICD-10-CM

## 2021-02-17 DIAGNOSIS — R2689 Other abnormalities of gait and mobility: Secondary | ICD-10-CM

## 2021-02-17 DIAGNOSIS — R2681 Unsteadiness on feet: Secondary | ICD-10-CM

## 2021-02-17 DIAGNOSIS — I69854 Hemiplegia and hemiparesis following other cerebrovascular disease affecting left non-dominant side: Secondary | ICD-10-CM

## 2021-02-17 DIAGNOSIS — M6281 Muscle weakness (generalized): Secondary | ICD-10-CM

## 2021-02-17 DIAGNOSIS — R41842 Visuospatial deficit: Secondary | ICD-10-CM

## 2021-02-17 DIAGNOSIS — M25612 Stiffness of left shoulder, not elsewhere classified: Secondary | ICD-10-CM

## 2021-02-17 NOTE — Therapy (Addendum)
[facility] [address] [place], [place], [address] Phone: [phone]   Fax:  [phone]  Physical Therapy Treatment  Patient Details  Name: Howard White MRN: [id number] Date of Birth: 10/29/1961 Referring Provider (PT): Letta Pate Luanna Salk, MD   Encounter Date: 02/17/2021   PT End of Session - 02/17/21 1155    Visit Number 13    Number of Visits 13    Date for PT Re-Evaluation 02/27/21    Authorization Type HTA - 10th visit PN    Progress Note Due on Visit 20    PT Start Time 1109    PT Stop Time 1146    PT Time Calculation (min) 37 min    Equipment Utilized During Treatment Gait belt    Activity Tolerance Patient tolerated treatment well    Behavior During Therapy Impulsive;WFL for tasks assessed/performed           Past Medical History:  Diagnosis Date  . Brain cancer ([place])   . Seizures ([place])   . Thyroid disease    Hypothyroid    Past Surgical History:  Procedure Laterality Date  . brain cancer    . BRAIN SURGERY      There were no vitals filed for this visit.   Subjective Assessment - 02/17/21 1113    Subjective Pt is sore on L side from aquatic.  Has not walked at home with dad yet due to fatigue.    Patient is accompained by: Family member   mom   Pertinent History Epilepsy, malignant neoplasm of brain, astrocytoma brain tumor, thyroid disease, brain surgery    Limitations Standing;Walking    Patient Stated Goals Work on his balance and be able to walk by himself.    Currently in Pain? Yes              [facility] PT Assessment - 02/17/21 1116      Assessment   Medical Diagnosis Basal Ganglia CVA; L hemiparesis    Referring Provider (PT) Letta Pate, Luanna Salk, MD    Onset Date/Surgical Date 09/26/21    Hand Dominance Right    Prior Therapy acute, CIR, HH      Precautions   Precautions Fall    Precaution Comments Epilepsy, malignant neoplasm of brain, astrocytoma brain tumor, thyroid  disease, brain surgery      Prior Function   Level of Independence Independent    Vocation Retired      Observation/Other Assessments   Focus on Therapeutic Outcomes (FOTO)  will re-assess next session      Transfers   Transfers Sit to Stand;Stand to Sit;Stand Pivot Transfers    Sit to Stand 5: Supervision    Stand to Sit 5: Supervision    Stand Pivot Transfers 5: Supervision      Ambulation/Gait   Ambulation/Gait Yes    Ambulation/Gait Assistance 4: Min guard    Ambulation/Gait Assistance Details with Hurrycane over indoor surfaces with intermittent cues for head and trunk righting    Ambulation Distance (Feet) 300 Feet    Assistive device Straight cane    Gait Pattern Step-through pattern;Decreased arm swing - left;Decreased step length - left;Decreased stance time - left;Lateral trunk lean to right    Ambulation Surface Level;Indoor    Stairs Yes    Stairs Assistance 4: Min assist    Stairs Assistance Details (indicate cue type and reason) cues to attend to LUE position on rail and for safety when turning at top and bottom of stairs  Stair Management Technique Two rails;Alternating pattern;Forwards    Number of Stairs 12    Height of Stairs 6      Standardized Balance Assessment   Standardized Balance Assessment Berg Balance Test;Five Times Sit to Stand;Dynamic Gait Index    Five times sit to stand comments  13 seconds from mat without use of UE, supervision and no LOB backwards      Berg Balance Test   Sit to Stand Able to stand without using hands and stabilize independently    Standing Unsupported Able to stand safely 2 minutes    Sitting with Back Unsupported but Feet Supported on Floor or Stool Able to sit safely and securely 2 minutes    Stand to Sit Sits safely with minimal use of hands    Transfers Able to transfer safely, minor use of hands    Standing Unsupported with Eyes Closed Able to stand 10 seconds with supervision    Standing Unsupported with Feet  Together Able to place feet together independently and stand for 1 minute with supervision    From Standing, Reach Forward with Outstretched Arm Can reach forward >12 cm safely (5")    From Standing Position, Pick up Object from [place] to pick up shoe, needs supervision    From Standing Position, Turn to Look Behind Over each Shoulder Looks behind one side only/other side shows less weight shift    Turn 360 Degrees Able to turn 360 degrees safely but slowly    Standing Unsupported, Alternately Place Feet on Step/Stool Able to complete >2 steps/needs minimal assist    Standing Unsupported, One Foot in Front Able to take small step independently and hold 30 seconds    Standing on One Leg Tries to lift leg/unable to hold 3 seconds but remains standing independently    Total Score 41    Berg comment: 41/56 significant falls risk      Dynamic Gait Index   Level Surface Moderate Impairment    Change in Gait Speed Moderate Impairment    Gait with Horizontal Head Turns Moderate Impairment    Gait with Vertical Head Turns Moderate Impairment    Gait and Pivot Turn Mild Impairment    Step Over Obstacle Severe Impairment    Step Around Obstacles Mild Impairment    Steps Mild Impairment    Total Score 10    DGI comment: 10/24 with Hurrycane                                 PT Education - 02/17/21 1154    Education Details progress towards goals, goals to address next session, safe sequence for stepping over obstacles    Person(s) Educated Patient;Parent(s)    Methods Explanation    Comprehension Verbalized understanding            PT Short Term Goals - 01/27/21 1431      PT SHORT TERM GOAL #1   Title Pt will demonstrate compliance with initial HEP with supervision of parents    Time 4    Period Weeks    Status Achieved    Target Date 01/28/21      PT SHORT TERM GOAL #2   Title Pt will demonstrate improved safety with transfers and will decrease time to  perform five time sit to stand to 18 seconds with min A    Time 4    Period Weeks    Status  Achieved    Target Date 01/28/21      PT SHORT TERM GOAL #3   Title Pt will improve BERG to >/= 30/56 to indicate decreased falls risk    Baseline 32/56    Time 4    Period Weeks    Status Achieved    Target Date 01/28/21      PT SHORT TERM GOAL #4   Title Pt will demonstrate decreased falls risk when ambulating with cane as indicated by increase in DGI to >/= 11/24    Baseline 9/24    Time 4    Period Weeks    Status Partially Met    Target Date 01/28/21             PT Long Term Goals - 02/17/21 1113      PT LONG TERM GOAL #1   Title Pt will demonstrate ability to perform final HEP and walking program with supervision    Time 8    Period Weeks    Status On-going      PT LONG TERM GOAL #2   Title Pt will report improvement in LE function on FOTO to >/= 66%    Baseline 54%    Time 8    Period Weeks    Status On-going      PT LONG TERM GOAL #3   Title Pt will decrease five time sit to stand to </= 15 seconds with supervision    Baseline 13 seconds with supervision    Time 8    Period Weeks    Status Achieved      PT LONG TERM GOAL #4   Title Pt will increase BERG to >/= 40/56    Baseline 41/56    Time 8    Period Weeks    Status Achieved      PT LONG TERM GOAL #5   Title Pt will increase DGI to >/= 18/24    Baseline 10/24    Time 8    Period Weeks    Status Not Met      PT LONG TERM GOAL #6   Title Pt will demonstrate ability to safely stand from the floor MOD I with use of UE    Time 8    Period Weeks    Status On-going      PT LONG TERM GOAL #7   Title Pt will ambulate x 500' outside with LRAD and supervision and will negotiate 12 stairs with R rail (ascending) with supervision to be able to access upstairs bedroom    Time 8    Period Weeks    Status On-going                 Plan - 02/17/21 1156    Clinical Impression Statement Initiated  assessment of progress towards LTG.  Pt has met five time sit to stand goal and BERG goal and is demonstrating improve static standing balance and safety with transfers.  Pt did not meet stair or DGI goal and has only recently been cleared to ambulate in very controlled environment at home.  Pt continues to be at significant risk for falls and demonstrates significant gait abnormalities.  Will complete assessment of remaining goals at next session.    Personal Factors and Comorbidities Comorbidity 3+;Past/Current Experience;Social Background    Comorbidities Epilepsy, malignant neoplasm of brain, astrocytoma brain tumor, thyroid disease, brain surgery, lives with elderly parents    Examination-Activity Limitations Crane;Locomotion Level;Stairs;Transfers;Bathing;Toileting    Examination-Participation  Restrictions Community Activity;Meal Prep;Shop    Stability/Clinical Decision Making Evolving/Moderate complexity    Rehab Potential Good    PT Frequency 2x / week    PT Duration 8 weeks    PT Treatment/Interventions ADLs/Self Care Home Management;Aquatic Therapy;DME Instruction;Gait training;Cognitive remediation;Stair training;Patient/family education;Functional mobility training;Orthotic Fit/Training;Therapeutic activities;Therapeutic exercise;Balance training;Neuromuscular re-education;Passive range of motion;Vestibular;Visual/perceptual remediation/compensation    PT Next Visit Plan Does he have non-rocker shoes to help with stability?  Check remaining LTG - HEP, FOTO, gait outside, floor transfers; recert.  How is walking at home going? Gait training/safety with cane; stair negotiation - work on weaning off of wheelchair.  resisted sit <> stand, NMR/WB focusing on attention to L, tall kneeling, balance when turning, reorientation to midline/weight shifting/head and trunk righting.    PT Home Exercise Plan Access Code: [email or web address]    Consulted and Agree with Plan of Care Patient;Family member/caregiver     Family Member Consulted parents           Patient will benefit from skilled therapeutic intervention in order to improve the following deficits and impairments:  Abnormal gait,Decreased balance,Decreased cognition,Decreased coordination,Difficulty walking,Impaired tone,Impaired UE functional use,Impaired vision/preception,Postural dysfunction,Pain  Visit Diagnosis: Repeated falls  Other abnormalities of gait and mobility  Unsteadiness on feet  Other symptoms and signs involving the nervous system  Other lack of coordination  Hemiplegia and hemiparesis 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 Slow transit constipation   . Sleep disturbance   . Basal ganglia stroke ([place])   . Incontinence of feces   . Leukocytosis   . Seizures ([place])   . Spastic hemiparesis ([place])   . Cerebrovascular accident (CVA) of right basal ganglia ([place]) 09/30/2020  . CVA (cerebral vascular accident) ([place]) 09/26/2020  . Gait disorder 10/12/2016  . Memory loss 10/12/2016  . Epilepsy ([place]) 03/30/2013  . Malignant neoplasm of brain ([place]) 03/30/2013  . 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 [place], [place], [address] Phone: [phone]   Fax:  [phone]  Name: Brentt Fread MRN: [id number] Date of Birth: 1961-03-03

## 2021-02-17 NOTE — Therapy (Signed)
[facility] [address] [place], [place], [address] Phone: [phone]   Fax:  [phone]  Occupational Therapy Treatment  Patient Details  Name: Howard White MRN: [id number] Date of Birth: 1961-03-28 Referring Provider (OT): Alysia Penna, MD   Encounter Date: 02/17/2021   OT End of Session - 02/17/21 1148    Visit Number 14    Number of Visits 25    Date for OT Re-Evaluation 03/23/21    Authorization Type [employer]    Authorization Time Period $15 copay for OT, VL:MN    OT Start Time 1146    OT Stop Time 1230    OT Time Calculation (min) 44 min    Activity Tolerance Patient tolerated treatment well    Behavior During Therapy [facility] for tasks assessed/performed           Past Medical History:  Diagnosis Date  . Brain cancer ([place])   . Seizures ([place])   . Thyroid disease    Hypothyroid    Past Surgical History:  Procedure Laterality Date  . brain cancer    . BRAIN SURGERY      There were no vitals filed for this visit.   Subjective Assessment - 02/17/21 1149    Subjective  I am using my left hand for the cards and holding a plate with my left hand. Pt reports hip is painful from pool workout.    Patient is accompanied by: Family member    Pertinent History Brain Tumor 2001. Hx of Seizures    Limitations Seizures. Fall Risk    Patient Stated Goals Walk again and use a cane. "foot, hand and balance"    Currently in Pain? Yes    Pain Score 2     Pain Location Hip    Pain Orientation Left    Pain Descriptors / Indicators Sore    Pain Type Acute pain    Pain Onset Yesterday    Pain Frequency Occasional            Treatment: Table Slides x 10 for forward reaching and range of motion for LUE Box and Blocks: 20 blocks with LUE ADLs: pt reports completing dressing independently. Pt has dad complete shaving for him. Pt reports needing assistance for toilet hygiene. OT encouraged patient  to try and complete hygiene himself next time after a bowel movement. Pt reports needing assistance for getting in/out of shower but completes bathing independently.  Functional LUE use: starting with resistance clothespins with max difficulty and max compensatory movements. Moved to large pegs with LUE with working on cooridnation and low level forward reaching with LUE. PT reqiured max verbal, visual and tactile cues for shoulder hiking, trunk lateral flex to right and for upright sitting posture. Pt also required cueing for impulsive movements.                   OT Short Term Goals - 02/17/21 1152      OT SHORT TERM GOAL #1   Title Pt will be independent with initial HEP 01/26/2021    Time 4    Period Weeks    Status Achieved   coordination HEP   Target Date 01/26/21      OT SHORT TERM GOAL #2   Title Pt will improve wrist extension in LUE to 35 degrees for improving functional use of LUE.    Baseline LUE 30 passive - unable to maintain position actively.    Time 4  Period Weeks    Status Achieved      OT SHORT TERM GOAL #3   Title Pt will improve attention, balance and range of motion in order to complete toilet hygiene with no physical assistance.    Time 4    Period Weeks    Status On-going   OT encouraged patient to try and complete first before having assistance.     OT SHORT TERM GOAL #4   Title Pt will perform home management and/or simple meal prep with supervision and good safety awareness    Time 4    Period Weeks    Status Deferred   pt living with parents and not a goal at this time.     OT SHORT TERM GOAL #5   Title Pt will perform all basic ADLs without physical assistance.    Time 4    Period Weeks    Status On-going      OT SHORT TERM GOAL #6   Title Pt will improve functional use of LUE by scoring 25 or greater on Box and Blocks with LUE.    Baseline RUE 41, LUE 18    Time 4    Period Weeks    Status On-going   LUE 20 blocks 02/17/21             OT Long Term Goals - 02/16/21 1431      OT LONG TERM GOAL #1   Title Pt will be independent with updated HEP    Time 12    Period Weeks    Status On-going      OT LONG TERM GOAL #2   Title Pt will complete environmental scanning with 95% accuracy.    Time 12    Period Weeks    Status On-going      OT LONG TERM GOAL #3   Title Pt will attend to a novel cognitive task for 15 minutes or greater in a moderately distracting environment with no cues for redirection.    Time 12    Period Weeks    Status On-going      OT LONG TERM GOAL #4   Title Pt will achieve wrist extension of 45 degrees or greater in LUE    Baseline 30*    Time 12    Period Weeks    Status On-going      OT LONG TERM GOAL #5   Title Pt will improve grip strength in LUE to 30 lbs or greater for increase in ability to complete clothing management and other functional tasks with LUE.    Baseline LUE 18 RUE 41    Time 12    Period Weeks    Status On-going      OT LONG TERM GOAL #6   Title Pt will improve fine motor coordination in LUE by completing 9 hole peg test in 2 minutes or less    Baseline 2 pegs in 2 minutes    Time 12    Period Weeks    Status On-going      OT LONG TERM GOAL #7   Title Pt will complete FOTO And score 66% or greater.    Baseline 54%    Time 12    Period Weeks    Status On-going                 Plan - 02/17/21 1324    Clinical Impression Statement Pt with significant compensatory movements in sitting at tabletop for  low level reaching activities this day.    OT Occupational Profile and History Detailed Assessment- Review of Records and additional review of physical, cognitive, psychosocial history related to current functional performance    Occupational performance deficits (Please refer to evaluation for details): ADL's    Body Structure / Function / Physical Skills ADL;ROM;UE functional use;FMC;Decreased knowledge of use of  DME;Balance;Vision;Dexterity;GMC;Coordination;IADL;Proprioception;Tone;Strength;Pain;Endurance    Cognitive Skills Attention;Safety Awareness;Sequencing;Orientation;Memory;Understand;Problem Solve    Rehab Potential Good    Clinical Decision Making Several treatment options, min-mod task modification necessary    Comorbidities Affecting Occupational Performance: May have comorbidities impacting occupational performance    Modification or Assistance to Complete Evaluation  No modification of tasks or assist necessary to complete eval    OT Frequency 2x / week    OT Duration 12 weeks   24 visits + eval   OT Treatment/Interventions Self-care/ADL training;Therapeutic exercise;Visual/perceptual remediation/compensation;Patient/family education;Splinting;Neuromuscular education;Moist Heat;Aquatic Therapy;DME and/or AE instruction;Passive range of motion;Therapist, nutritional;Therapeutic activities;Energy conservation;Electrical Stimulation;Manual Therapy    Plan Review initial HEP and add/update prn, aquatics for postural realignment and activation, balance, gross motor coordiantion, and LUE fuctional ROM.    Recommended Other Services consider ST?    Consulted and Agree with Plan of Care Patient;Family member/caregiver   mom and dad   Family Member Consulted Mom and Dad           Patient will benefit from skilled therapeutic intervention in order to improve the following deficits and impairments:   Body Structure / Function / Physical Skills: ADL,ROM,UE functional use,FMC,Decreased knowledge of use of DME,Balance,Vision,Dexterity,GMC,Coordination,IADL,Proprioception,Tone,Strength,Pain,Endurance Cognitive Skills: Attention,Safety Awareness,Sequencing,Orientation,Memory,Understand,Problem Solve     Visit Diagnosis: Visuospatial deficit  Hemiplegia and hemiparesis following other cerebrovascular disease affecting left non-dominant side (HCC)  Unsteadiness on feet  Stiffness of left  shoulder, not elsewhere classified  Muscle weakness (generalized)  Other symptoms and signs involving the nervous system  Other lack of coordination  Attention and concentration deficit    Problem List Patient Active Problem List   Diagnosis Date Noted  . Slow transit constipation   . Sleep disturbance   . Basal ganglia stroke ([place])   . Incontinence of feces   . Leukocytosis   . Seizures ([place])   . Spastic hemiparesis ([place])   . Cerebrovascular accident (CVA) of right basal ganglia ([place]) 09/30/2020  . CVA (cerebral vascular accident) ([place]) 09/26/2020  . Gait disorder 10/12/2016  . Memory loss 10/12/2016  . Epilepsy ([place]) 03/30/2013  . Malignant neoplasm of brain ([place]) 03/30/2013  . 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 [place], [place], [address] Phone: [phone]   Fax:  [phone]  Name: Howard White MRN: [id number] Date of Birth: Mar 29, 1961

## 2021-02-19 ENCOUNTER — Other Ambulatory Visit: Payer: Self-pay

## 2021-02-19 ENCOUNTER — Encounter: Payer: PPO | Admitting: Occupational Therapy

## 2021-02-19 ENCOUNTER — Ambulatory Visit: Payer: PPO | Admitting: Physical Therapy

## 2021-02-19 DIAGNOSIS — R296 Repeated falls: Secondary | ICD-10-CM

## 2021-02-19 DIAGNOSIS — R29818 Other symptoms and signs involving the nervous system: Secondary | ICD-10-CM

## 2021-02-19 DIAGNOSIS — M6281 Muscle weakness (generalized): Secondary | ICD-10-CM

## 2021-02-19 DIAGNOSIS — R278 Other lack of coordination: Secondary | ICD-10-CM

## 2021-02-19 DIAGNOSIS — R2681 Unsteadiness on feet: Secondary | ICD-10-CM

## 2021-02-19 DIAGNOSIS — I69854 Hemiplegia and hemiparesis following other cerebrovascular disease affecting left non-dominant side: Secondary | ICD-10-CM | POA: Diagnosis not present

## 2021-02-19 NOTE — Patient Instructions (Addendum)
Access Code: [email or web address] URL: [email or web address] Date: 02/19/2021 Prepared by: Misty Stanley  Exercises Sit to/from Stand in Stride position - 1 x daily - 5 x weekly - 1 sets - 10 reps Heel Toe Raises with Counter Support - 1 x daily - 5 x weekly - 1 sets - 10 reps Wide Stance with Unilateral Counter Support - 1 x daily - 7 x weekly - 2 sets - 10 reps Seated Alternating Side Stretch with Arm Overhead - 1 x daily - 7 x weekly - 5 reps

## 2021-02-19 NOTE — Therapy (Signed)
[facility] [address] [name], [place], [address] Phone: [phone]   Fax:  [phone]  Physical Therapy Treatment  Patient Details  Name: Howard White MRN: [id number] Date of Birth: January 27, 1961 Referring Provider (PT): Charlett Blake, MD   Encounter Date: 02/19/2021    02/19/21 1244  PT Visits / Re-Eval  Visit Number 14  Number of Visits 13  Date for PT Re-Evaluation 02/27/21  Authorization  Authorization Type HTA - 10th visit PN  Progress Note Due on Visit 20  PT Time Calculation  PT Start Time 1145  PT Stop Time 1243  PT Time Calculation (min) 58 min  PT - End of Session  Equipment Utilized During Treatment Gait belt  Activity Tolerance Patient tolerated treatment well  Behavior During Therapy Impulsive;WFL for tasks assessed/performed      Past Medical History:  Diagnosis Date  . Brain cancer ([place])   . Seizures ([place])   . Thyroid disease    Hypothyroid    Past Surgical History:  Procedure Laterality Date  . brain cancer    . BRAIN SURGERY      There were no vitals filed for this visit.   Subjective Assessment - 02/19/21 1152    Subjective Pt reports he still has pain in his knees.  He walked a little outside to the patio but has not been walking a lot with his dad yet.    Patient is accompained by: Family member   mom   Pertinent History Epilepsy, malignant neoplasm of brain, astrocytoma brain tumor, thyroid disease, brain surgery    Limitations Standing;Walking    Patient Stated Goals Work on his balance and be able to walk by himself.    Currently in Pain? Yes    Pain Score 2     Pain Location Knee              [facility] PT Assessment - 02/19/21 1339      Assessment   Medical Diagnosis Basal Ganglia CVA; L hemiparesis    Referring Provider (PT) Charlett Blake, MD    Onset Date/Surgical Date 09/26/21    Hand Dominance Right    Prior Therapy acute, CIR, HH       Precautions   Precautions Fall    Precaution Comments Epilepsy, malignant neoplasm of brain, astrocytoma brain tumor, thyroid disease, brain surgery      Prior Function   Level of Independence Independent    Vocation Retired      Observation/Other Assessments   Focus on Therapeutic Outcomes (FOTO)  will re-assess next session                         [facility] PT Treatment/Exercise - 02/19/21 1339      Transfers   Transfers Floor to Transfer    Sit to Stand 5: Supervision    Sit to Stand Details Verbal cues for technique   2x10 with staggered stance - L foot posteriorly located   Sit to Stand Details (indicate cue type and reason) verbal cueing to stand tall at top and slow eccentric control    Floor to Transfer 4: Min guard   x2 transfer with verbal and visual cueing; pt able to perform with modified independence, still lacks safety awarness     Exercises   Other Exercises  x10 with 10sec hold Seated lateral side bend to L; reaching LUE as far as possible and then reaching R hand overhead.  Verbal and visual cueing to sidebend and not flex at trunk or hips, keep head in neutral and upright      Knee/Hip Exercises: Standing   Heel Raises Both;3 sets;10 reps;Limitations   with UE support at counter   Heel Raises Limitations weightshifting noted to R - verbal and tactile cueing for correction               Balance Exercises - 02/19/21 1344      Balance Exercises: Standing   Standing Eyes Opened Wide (BOA);Head turns;Limitations   with RUE support at counter   Standing Eyes Opened Limitations required postural corrections after 5 repetitions    Standing Eyes Closed Wide (BOA);Solid surface;20 secs;Limitations   2x20seconds wihtout UE support and CGA   Standing Eyes Closed Limitations Significant postural sway noted - not appropriate to perform exercise at home due to increased difficulty and need for CGA             PT Education - 02/19/21 1243     Education Details Updated HEP    Person(s) Educated Patient;Parent(s)    Methods Explanation;Handout    Comprehension Verbalized understanding            PT Short Term Goals - 01/27/21 1431      PT SHORT TERM GOAL #1   Title Pt will demonstrate compliance with initial HEP with supervision of parents    Time 4    Period Weeks    Status Achieved    Target Date 01/28/21      PT SHORT TERM GOAL #2   Title Pt will demonstrate improved safety with transfers and will decrease time to perform five time sit to stand to 18 seconds with min A    Time 4    Period Weeks    Status Achieved    Target Date 01/28/21      PT SHORT TERM GOAL #3   Title Pt will improve BERG to >/= 30/56 to indicate decreased falls risk    Baseline 32/56    Time 4    Period Weeks    Status Achieved    Target Date 01/28/21      PT SHORT TERM GOAL #4   Title Pt will demonstrate decreased falls risk when ambulating with cane as indicated by increase in DGI to >/= 11/24    Baseline 9/24    Time 4    Period Weeks    Status Partially Met    Target Date 01/28/21             PT Long Term Goals - 02/19/21 1254      PT LONG TERM GOAL #1   Title Pt will demonstrate ability to perform final HEP and walking program with supervision    Baseline Updated HEP and initiated walking at home with supervision    Time 8    Period Weeks    Status On-going      PT LONG TERM GOAL #2   Title Pt will report improvement in LE function on FOTO to >/= 66%    Baseline 54%    Time 8    Period Weeks    Status On-going      PT LONG TERM GOAL #3   Title Pt will decrease five time sit to stand to </= 15 seconds with supervision    Baseline 13 seconds with supervision    Time 8    Period Weeks    Status Achieved  PT LONG TERM GOAL #4   Title Pt will increase BERG to >/= 40/56    Baseline 41/56    Time 8    Period Weeks    Status Achieved      PT LONG TERM GOAL #5   Title Pt will increase DGI to >/= 18/24     Baseline 10/24    Time 8    Period Weeks    Status Not Met      PT LONG TERM GOAL #6   Title Pt will demonstrate ability to safely stand from the floor MOD I with use of UE    Baseline Performed with supervision and verbal cueing    Time 8    Period Weeks    Status Achieved      PT LONG TERM GOAL #7   Title Pt will ambulate x 500' outside with LRAD and supervision and will negotiate 12 stairs with R rail (ascending) with supervision to be able to access upstairs bedroom    Time 8    Period Weeks    Status On-going           New Goals for Recert  PT Short Term Goals - 02/19/21 1320      PT SHORT TERM GOAL #1   Title Pt will report compliance with updated HEP with parental supervision 5x a week    Time 4    Period Weeks    Status New    Target Date 03/21/21      PT SHORT TERM GOAL #2   Title Pt will perform all stand pivot transfers mod I    Time 4    Period Weeks    Status New    Target Date 03/21/21      PT SHORT TERM GOAL #3   Title Pt will improve BERG to >/= 46/56 to indicate decreased falls risk    Baseline 41/56    Time 4    Period Weeks    Status New    Target Date 03/21/21      PT SHORT TERM GOAL #4   Title Pt will demonstrate decreased falls risk when ambulating with cane as indicated by increase in DGI to >/= 11/24    Baseline 10/24    Time 4    Period Weeks    Status Revised    Target Date 03/21/21           PT Long Term Goals - 02/19/21 1324      PT LONG TERM GOAL #1   Title Pt will demonstrate ability to perform final HEP and walking program with supervision    Baseline Updated HEP and initiated walking at home with supervision    Time 8    Period Weeks    Status On-going    Target Date 04/20/21      PT LONG TERM GOAL #2   Title Pt will report improvement in LE function on FOTO to >/= 66%    Baseline 54%    Time 8    Period Weeks    Status On-going    Target Date 04/20/21      PT LONG TERM GOAL #3   Title Pt will be able to  ambulate in and out of clinic with [place] and supervision    Time 8    Period Weeks    Status New    Target Date 04/20/21      PT LONG TERM GOAL #4   Title Pt  will increase BERG to >/= 51/56    Baseline 41/56    Time 8    Period Weeks    Status New    Target Date 04/20/21      PT LONG TERM GOAL #5   Title Pt will increase DGI to >/= 14/24    Baseline 10/24    Time 8    Period Weeks    Status Revised    Target Date 04/20/21      PT LONG TERM GOAL #6   Title Pt will demonstrate ability to safely stand from the floor with supervision and use of UE    Time 8    Period Weeks    Status New    Target Date 04/20/21      PT LONG TERM GOAL #7   Title Pt will ambulate x 500' outside with LRAD and supervision and will negotiate 12 stairs with R rail (ascending) with supervision to be able to access upstairs bedroom    Time 8    Period Weeks    Status On-going    Target Date 04/20/21                Plan - 02/19/21 1246    Clinical Impression Statement Pt has made great progress with therapy.  Finished assessing LTGs.  Pt able to perform a floor to stand transfer with supervision and verbal cueing from therapist.  Unable to assess ambulation outside due to weather.  Reviewed and updated HEP to include appropriate exercises that would be safe to perform independelty or supervision from parents.  He still presents with impulsive behavior that impacts his safety awarness, decreased stability, decreased strength and abnormal gait pattern that impacts his function.  Pt owuld benefit from continuted skilled physical therapy to improve overall function and return to independence.    Personal Factors and Comorbidities Comorbidity 3+;Past/Current Experience;Social Background    Comorbidities Epilepsy, malignant neoplasm of brain, astrocytoma brain tumor, thyroid disease, brain surgery, lives with elderly parents    Examination-Activity Limitations [place];Locomotion  Level;Stairs;Transfers;Bathing;Toileting    Examination-Participation Restrictions Community Activity;Meal Prep;Shop    Stability/Clinical Decision Making Evolving/Moderate complexity    Rehab Potential Good    PT Frequency 2x / week    PT Duration 8 weeks    PT Treatment/Interventions ADLs/Self Care Home Management;Aquatic Therapy;DME Instruction;Gait training;Cognitive remediation;Stair training;Patient/family education;Functional mobility training;Orthotic Fit/Training;Therapeutic activities;Therapeutic exercise;Balance training;Neuromuscular re-education;Passive range of motion;Vestibular;Visual/perceptual remediation/compensation    PT Next Visit Plan Trying water shoes because they are flat bottoms.  Check remaining LTG - FOTO, gait outside?  How is walking at home going? Gait training/safety with cane; stair negotiation - work on weaning off of wheelchair.  resisted sit <> stand, NMR/WB focusing on attention to L, tall kneeling, balance when turning, reorientation to midline/weight shifting/head and trunk righting.    PT Home Exercise Plan Access Code: [email or web address]    Consulted and Agree with Plan of Care Patient;Family member/caregiver    Family Member Consulted parents           Patient will benefit from skilled therapeutic intervention in order to improve the following deficits and impairments:  Abnormal gait,Decreased balance,Decreased cognition,Decreased coordination,Difficulty walking,Impaired tone,Impaired UE functional use,Impaired vision/preception,Postural dysfunction,Pain  Visit Diagnosis: Hemiplegia and hemiparesis following other cerebrovascular disease affecting left non-dominant side ([place])  Unsteadiness on feet  Muscle weakness (generalized)  Other symptoms and signs involving the nervous system  Other lack of coordination  Repeated falls     Problem List Patient Active Problem List  Diagnosis Date Noted  . Slow transit constipation   . Sleep disturbance   .  Basal ganglia stroke ([place])   . Incontinence of feces   . Leukocytosis   . Seizures ([place])   . Spastic hemiparesis ([place])   . Cerebrovascular accident (CVA) of right basal ganglia ([place]) 09/30/2020  . CVA (cerebral vascular accident) ([place]) 09/26/2020  . Gait disorder 10/12/2016  . Memory loss 10/12/2016  . Epilepsy ([place]) 03/30/2013  . Malignant neoplasm of brain ([place]) 03/30/2013  . Astrocytoma brain tumor [facility]) 03/27/2012    Yetta Numbers, SPT 02/19/2021, 1:50 PM  [facility] [address] [place], [place], [address] Phone: [phone]   Fax:  [phone]  Name: Liberato Stansbery MRN: [id number] Date of Birth: 08-14-1961

## 2021-02-23 ENCOUNTER — Encounter: Payer: Self-pay | Admitting: Occupational Therapy

## 2021-02-23 ENCOUNTER — Ambulatory Visit: Payer: PPO | Admitting: Occupational Therapy

## 2021-02-23 DIAGNOSIS — I69854 Hemiplegia and hemiparesis following other cerebrovascular disease affecting left non-dominant side: Secondary | ICD-10-CM | POA: Diagnosis not present

## 2021-02-23 NOTE — Therapy (Signed)
[facility] [address] [place], [place], [address] Phone: [phone]   Fax:  [phone]  Occupational Therapy Treatment  Patient Details  Name: Howard White MRN: [id number] Date of Birth: 08/24/1961 Referring Provider (OT): Alysia Penna, MD   Encounter Date: 02/23/2021   OT End of Session - 02/23/21 2006    Visit Number 15    Number of Visits 25    Date for OT Re-Evaluation 03/23/21    Authorization Type [employer]    Authorization Time Period $15 copay for OT, VL:MN    OT Start Time 1320    OT Stop Time 1415    OT Time Calculation (min) 55 min    Activity Tolerance Patient tolerated treatment well    Behavior During Therapy [facility] for tasks assessed/performed           Past Medical History:  Diagnosis Date  . Brain cancer ([place])   . Seizures ([place])   . Thyroid disease    Hypothyroid    Past Surgical History:  Procedure Laterality Date  . brain cancer    . BRAIN SURGERY      There were no vitals filed for this visit.   Subjective Assessment - 02/23/21 2005    Subjective  Patient reports being able to hold a plate with his left hand at his niece's birthday party    Patient is accompanied by: Family member    Pertinent History Brain Tumor 2001. Hx of Seizures    Limitations Seizures. Fall Risk    Currently in Pain? No/denies    Pain Score 0-No pain           Patient seen for aquatic therapy session. Patient entered and exited the pool via stairs and min assist.  Patient able to use step through pattern to exit with left hand on hand rail.   Today's session focused on improved standing balance and postural alignment and control for transitional movements and for controlled LUE movement.  Worked in 3.5-4 ft of water, and on underwater bench.  Patient with improved volitional left wrist extension following work with paddle to improve carpal mobility.  Patient needs continued work on  starts, stops, and directional changes with functional mobility                         OT Short Term Goals - 02/23/21 2008      OT SHORT TERM GOAL #1   Title Pt will be independent with initial HEP 01/26/2021    Time 4    Period Weeks    Status Achieved   coordination HEP   Target Date 01/26/21      OT SHORT TERM GOAL #2   Title Pt will improve wrist extension in LUE to 35 degrees for improving functional use of LUE.    Baseline LUE 30 passive - unable to maintain position actively.    Time 4    Period Weeks    Status Achieved      OT SHORT TERM GOAL #3   Title Pt will improve attention, balance and range of motion in order to complete toilet hygiene with no physical assistance.    Time 4    Period Weeks    Status On-going   OT encouraged patient to try and complete first before having assistance.     OT SHORT TERM GOAL #4   Title Pt will perform home management and/or simple meal prep with supervision  and good safety awareness    Time 4    Period Weeks    Status Deferred   pt living with parents and not a goal at this time.     OT SHORT TERM GOAL #5   Title Pt will perform all basic ADLs without physical assistance.    Time 4    Period Weeks    Status On-going      OT SHORT TERM GOAL #6   Title Pt will improve functional use of LUE by scoring 25 or greater on Box and Blocks with LUE.    Baseline RUE 41, LUE 18    Time 4    Period Weeks    Status On-going   LUE 20 blocks 02/17/21            OT Long Term Goals - 02/23/21 2008      OT LONG TERM GOAL #1   Title Pt will be independent with updated HEP    Time 12    Period Weeks    Status On-going      OT LONG TERM GOAL #2   Title Pt will complete environmental scanning with 95% accuracy.    Time 12    Period Weeks    Status On-going      OT LONG TERM GOAL #3   Title Pt will attend to a novel cognitive task for 15 minutes or greater in a moderately distracting environment with no cues for  redirection.    Time 12    Period Weeks    Status On-going      OT LONG TERM GOAL #4   Title Pt will achieve wrist extension of 45 degrees or greater in LUE    Baseline 30*    Time 12    Period Weeks    Status Achieved      OT LONG TERM GOAL #5   Title Pt will improve grip strength in LUE to 30 lbs or greater for increase in ability to complete clothing management and other functional tasks with LUE.    Baseline LUE 18 RUE 41    Time 12    Period Weeks    Status On-going      OT LONG TERM GOAL #6   Title Pt will improve fine motor coordination in LUE by completing 9 hole peg test in 2 minutes or less    Baseline 2 pegs in 2 minutes    Time 12    Period Weeks    Status On-going      OT LONG TERM GOAL #7   Title Pt will complete FOTO And score 66% or greater.    Baseline 54%    Time 12    Period Weeks    Status On-going                 Plan - 02/23/21 2007    Clinical Impression Statement Pt with improving safety with speed of movement and stepping strategies in pool.  Working to increase awareness of LE positioning - e.g. elbow extension with wrist extension    OT Occupational Profile and History Detailed Assessment- Review of Records and additional review of physical, cognitive, psychosocial history related to current functional performance    Occupational performance deficits (Please refer to evaluation for details): ADL's    Body Structure / Function / Physical Skills ADL;ROM;UE functional use;FMC;Decreased knowledge of use of DME;Balance;Vision;Dexterity;GMC;Coordination;IADL;Proprioception;Tone;Strength;Pain;Endurance    Cognitive Skills Attention;Safety Awareness;Sequencing;Orientation;Memory;Understand;Problem Solve    Rehab Potential Good  Clinical Decision Making Several treatment options, min-mod task modification necessary    Comorbidities Affecting Occupational Performance: May have comorbidities impacting occupational performance    Modification or  Assistance to Complete Evaluation  No modification of tasks or assist necessary to complete eval    OT Frequency 2x / week    OT Duration 12 weeks   24 visits + eval   OT Treatment/Interventions Self-care/ADL training;Therapeutic exercise;Visual/perceptual remediation/compensation;Patient/family education;Splinting;Neuromuscular education;Moist Heat;Aquatic Therapy;DME and/or AE instruction;Passive range of motion;Therapist, nutritional;Therapeutic activities;Energy conservation;Electrical Stimulation;Manual Therapy    Plan Review initial HEP and add/update prn, aquatics for postural realignment and activation, balance, gross motor coordiantion, and LUE fuctional ROM.    Recommended Other Services consider ST?    Consulted and Agree with Plan of Care Patient;Family member/caregiver   mom and dad   Family Member Consulted Mom and Dad           Patient will benefit from skilled therapeutic intervention in order to improve the following deficits and impairments:   Body Structure / Function / Physical Skills: ADL,ROM,UE functional use,FMC,Decreased knowledge of use of DME,Balance,Vision,Dexterity,GMC,Coordination,IADL,Proprioception,Tone,Strength,Pain,Endurance Cognitive Skills: Attention,Safety Awareness,Sequencing,Orientation,Memory,Understand,Problem Solve     Visit Diagnosis: Hemiplegia and hemiparesis following other cerebrovascular disease affecting left non-dominant side ([facility])  Unsteadiness on feet  Muscle weakness (generalized)  Other symptoms and signs involving the nervous system  Other lack of coordination  Visuospatial deficit  Stiffness of left shoulder, not elsewhere classified  Attention and concentration deficit    Problem List Patient Active Problem List   Diagnosis Date Noted  . Slow transit constipation   . Sleep disturbance   . Basal ganglia stroke ([place])   . Incontinence of feces   . Leukocytosis   . Seizures ([place])   . Spastic hemiparesis ([place])    . Cerebrovascular accident (CVA) of right basal ganglia ([place]) 09/30/2020  . CVA (cerebral vascular accident) ([place]) 09/26/2020  . Gait disorder 10/12/2016  . Memory loss 10/12/2016  . Epilepsy ([place]) 03/30/2013  . Malignant neoplasm of brain ([place]) 03/30/2013  . Astrocytoma brain tumor [facility]) 03/27/2012    Mariah Milling 02/23/2021, 8:10 PM  [facility] [address] [place], [place], [address] Phone: [phone]   Fax:  [phone]  Name: Howard White MRN: [id number] Date of Birth: 1960-12-27

## 2021-02-24 ENCOUNTER — Encounter: Payer: Self-pay | Admitting: Occupational Therapy

## 2021-02-24 ENCOUNTER — Other Ambulatory Visit: Payer: Self-pay

## 2021-02-24 ENCOUNTER — Ambulatory Visit: Payer: PPO | Admitting: Occupational Therapy

## 2021-02-24 ENCOUNTER — Encounter: Payer: Self-pay | Admitting: Physical Therapy

## 2021-02-24 ENCOUNTER — Ambulatory Visit: Payer: PPO | Admitting: Physical Therapy

## 2021-02-24 DIAGNOSIS — M25612 Stiffness of left shoulder, not elsewhere classified: Secondary | ICD-10-CM

## 2021-02-24 DIAGNOSIS — R278 Other lack of coordination: Secondary | ICD-10-CM

## 2021-02-24 DIAGNOSIS — R29818 Other symptoms and signs involving the nervous system: Secondary | ICD-10-CM

## 2021-02-24 DIAGNOSIS — I69854 Hemiplegia and hemiparesis following other cerebrovascular disease affecting left non-dominant side: Secondary | ICD-10-CM

## 2021-02-24 DIAGNOSIS — M6281 Muscle weakness (generalized): Secondary | ICD-10-CM

## 2021-02-24 DIAGNOSIS — R296 Repeated falls: Secondary | ICD-10-CM

## 2021-02-24 DIAGNOSIS — R41842 Visuospatial deficit: Secondary | ICD-10-CM

## 2021-02-24 DIAGNOSIS — R2681 Unsteadiness on feet: Secondary | ICD-10-CM

## 2021-02-24 DIAGNOSIS — R41840 Attention and concentration deficit: Secondary | ICD-10-CM

## 2021-02-24 NOTE — Therapy (Signed)
[facility] [address] [place], [place], [address] Phone: [phone]   Fax:  [phone]  Physical Therapy Treatment  Patient Details  Name: Howard White MRN: [id number] Date of Birth: March 12, 1961 Referring Provider (PT): Letta Pate Luanna Salk, MD   Encounter Date: 02/24/2021   PT End of Session - 02/24/21 1421    Visit Number 15    Number of Visits 29    Date for PT Re-Evaluation 04/20/21    Authorization Type HTA - 10th visit PN    Progress Note Due on Visit 20    PT Start Time 1103    PT Stop Time 1145    PT Time Calculation (min) 42 min    Equipment Utilized During Treatment Gait belt    Activity Tolerance Patient tolerated treatment well    Behavior During Therapy Impulsive;WFL for tasks assessed/performed           Past Medical History:  Diagnosis Date  . Brain cancer ([facility])   . Seizures ([facility])   . Thyroid disease    Hypothyroid    Past Surgical History:  Procedure Laterality Date  . brain cancer    . BRAIN SURGERY      There were no vitals filed for this visit.   Subjective Assessment - 02/24/21 1109    Subjective Pt reports falling while trying to put his pants on but he did not have any injuries.  He has been doing his exercises at home and says they are going well.  His L hip was sore and he has been putting a heating pad on them.    Patient is accompained by: Family member   mom   Pertinent History Epilepsy, malignant neoplasm of brain, astrocytoma brain tumor, thyroid disease, brain surgery    Limitations Standing;Walking    Patient Stated Goals Work on his balance and be able to walk by himself.    Currently in Pain? Yes    Pain Location Knee                             [facility] Adult PT Treatment/Exercise - 02/24/21 1402      Transfers   Sit to Stand 5: Supervision    Sit to Stand Details Verbal cues for precautions/safety    Sit to Stand Details (indicate cue type  and reason) x10 without noticable lean to R      Neuro Re-ed    Neuro Re-ed Details  With green balance disc seated on mat reaching for therapist hand x60sec, x60sec reaching across body to encourage trunk rotation and lateral flexion.  With 2lb ball trunk rotation x10, x10 punches, x10 reaching to outside foot.  Realignment of posture between each rep, encourage to engage pelvis, prevent lateral lean to R; lateral hip facilitation by therapist. Facilitation to left pelvis for right lateral shift to assist with more neutral alignment as pt tends to shift all weight right with posterior pelvic protrusion on left and elevated right shoulder. Multiple cues/faciliation needed to correct alignment between tasks performed and with tasks while being performed.       Exercises   Other Exercises  Standing with visual cueing from mirror.  Therapist facilitation at lateral hip and trunk to help with midline orientation. x5 min standing - unable to maintain alignment without facilitation >5 seconds..  x60sec chest press with 1lb ball - lateral hip facilitation and trunk facilitation needed.  Verbal cueing needed to  prevent forward trunk flexion, maintianing head in neutral and for equal shoulder position. Pt able to maintain equal shoulder level/position with mirror feedback.                  PT Education - 02/24/21 1420    Education Details Midline orientation and self correcting posture    Person(s) Educated Patient;Parent(s)    Methods Explanation    Comprehension Verbalized understanding            PT Short Term Goals - 02/19/21 1320      PT SHORT TERM GOAL #1   Title Pt will report compliance with updated HEP with parental supervision 5x a week    Time 4    Period Weeks    Status New    Target Date 03/21/21      PT SHORT TERM GOAL #2   Title Pt will perform all stand pivot transfers mod I    Time 4    Period Weeks    Status New    Target Date 03/21/21      PT SHORT TERM GOAL #3    Title Pt will improve BERG to >/= 46/56 to indicate decreased falls risk    Baseline 41/56    Time 4    Period Weeks    Status New    Target Date 03/21/21      PT SHORT TERM GOAL #4   Title Pt will demonstrate decreased falls risk when ambulating with cane as indicated by increase in DGI to >/= 11/24    Baseline 10/24    Time 4    Period Weeks    Status Revised    Target Date 03/21/21             PT Long Term Goals - 02/19/21 1324      PT LONG TERM GOAL #1   Title Pt will demonstrate ability to perform final HEP and walking program with supervision    Baseline Updated HEP and initiated walking at home with supervision    Time 8    Period Weeks    Status On-going    Target Date 04/20/21      PT LONG TERM GOAL #2   Title Pt will report improvement in LE function on FOTO to >/= 66%    Baseline 54%    Time 8    Period Weeks    Status On-going    Target Date 04/20/21      PT LONG TERM GOAL #3   Title Pt will be able to ambulate in and out of clinic with [place] and supervision    Time 8    Period Weeks    Status New    Target Date 04/20/21      PT LONG TERM GOAL #4   Title Pt will increase BERG to >/= 51/56    Baseline 41/56    Time 8    Period Weeks    Status New    Target Date 04/20/21      PT LONG TERM GOAL #5   Title Pt will increase DGI to >/= 14/24    Baseline 10/24    Time 8    Period Weeks    Status Revised    Target Date 04/20/21      PT LONG TERM GOAL #6   Title Pt will demonstrate ability to safely stand from the floor with supervision and use of UE    Time 8    Period  Weeks    Status New    Target Date 04/20/21      PT LONG TERM GOAL #7   Title Pt will ambulate x 500' outside with LRAD and supervision and will negotiate 12 stairs with R rail (ascending) with supervision to be able to access upstairs bedroom    Time 8    Period Weeks    Status On-going    Target Date 04/20/21                 Plan - 02/24/21 1422     Clinical Impression Statement Address limitations in ability to maintain midline orientation through sitting balance exercises, progressing to standing. Encouraged trunk elongation and lateral side bending through motion while maintaining neutral pelvic alignment.  Compensations noted with excessive hip ER due to lack of trunk motion in staniding. Therapist facilitation needed to prevent lateral hip lean and maintain neutral pelvic alignment.  Overall pt shows significant improvement in ability to maintain alignment and postural positioning in sitting when not engaged in a task.    Personal Factors and Comorbidities Comorbidity 3+;Past/Current Experience;Social Background    Comorbidities Epilepsy, malignant neoplasm of brain, astrocytoma brain tumor, thyroid disease, brain surgery, lives with elderly parents    Examination-Activity Limitations [place];Locomotion Level;Stairs;Transfers;Bathing;Toileting    Examination-Participation Restrictions Community Activity;Meal Prep;Shop    Stability/Clinical Decision Making Evolving/Moderate complexity    Rehab Potential Good    PT Frequency 2x / week    PT Duration 8 weeks    PT Treatment/Interventions ADLs/Self Care Home Management;Aquatic Therapy;DME Instruction;Gait training;Cognitive remediation;Stair training;Patient/family education;Functional mobility training;Orthotic Fit/Training;Therapeutic activities;Therapeutic exercise;Balance training;Neuromuscular re-education;Passive range of motion;Vestibular;Visual/perceptual remediation/compensation    PT Next Visit Plan Standing at counter and reaching to encourage trunk elongation - more functional movements.  Gait training/safety with cane; stair negotiation - work on weaning off of wheelchair.  resisted sit <> stand, NMR/WB focusing on attention to L, tall kneeling, balance when turning, reorientation to midline/weight shifting/head and trunk righting.    PT Home Exercise Plan Access Code: [email or web address]     Consulted and Agree with Plan of Care Patient;Family member/caregiver    Family Member Consulted parents           Patient will benefit from skilled therapeutic intervention in order to improve the following deficits and impairments:  Abnormal gait,Decreased balance,Decreased cognition,Decreased coordination,Difficulty walking,Impaired tone,Impaired UE functional use,Impaired vision/preception,Postural dysfunction,Pain  Visit Diagnosis: Hemiplegia and hemiparesis following other cerebrovascular disease affecting left non-dominant side ([facility])  Unsteadiness on feet  Muscle weakness (generalized)  Other symptoms and signs involving the nervous system  Other lack of coordination  Repeated falls     Problem List Patient Active Problem List   Diagnosis Date Noted  . Slow transit constipation   . Sleep disturbance   . Basal ganglia stroke ([facility])   . Incontinence of feces   . Leukocytosis   . Seizures ([facility])   . Spastic hemiparesis ([facility])   . Cerebrovascular accident (CVA) of right basal ganglia ([facility]) 09/30/2020  . CVA (cerebral vascular accident) ([facility]) 09/26/2020  . Gait disorder 10/12/2016  . Memory loss 10/12/2016  . Epilepsy ([facility]) 03/30/2013  . Malignant neoplasm of brain ([facility]) 03/30/2013  . Astrocytoma brain tumor [facility]) 03/27/2012    Yetta Numbers, SPT 02/24/2021, 2:27 PM  [facility] [address] [place], [place], [address] Phone: [phone]   Fax:  [phone]  Name: Howard White MRN: [id number] Date of Birth: 1960-12-07  This note has been reviewed and edited by  supervising CI.  Willow Ora, PTA, [facility] [address] [place], [place] [address] [phone] 02/24/21, 6:58 PM

## 2021-02-24 NOTE — Therapy (Signed)
[facility] [address] [place], [place], [address] Phone: [phone]   Fax:  [phone]  Occupational Therapy Treatment  Patient Details  Name: Howard White MRN: [id number] Date of Birth: 11-16-1960 Referring Provider (OT): Alysia Penna, MD   Encounter Date: 02/24/2021   OT End of Session - 02/24/21 1149    Visit Number 16    Number of Visits 25    Date for OT Re-Evaluation 03/23/21    Authorization Type [employer]    Authorization Time Period $15 copay for OT, VL:MN    OT Start Time 1147    OT Stop Time 1230    OT Time Calculation (min) 43 min    Activity Tolerance Patient tolerated treatment well    Behavior During Therapy [facility] for tasks assessed/performed           Past Medical History:  Diagnosis Date  . Brain cancer ([facility])   . Seizures ([facility])   . Thyroid disease    Hypothyroid    Past Surgical History:  Procedure Laterality Date  . brain cancer    . BRAIN SURGERY      There were no vitals filed for this visit.   Subjective Assessment - 02/24/21 1151    Subjective  Pt reports being sore after our last session with functional use of LUE    Patient is accompanied by: Family member    Pertinent History Brain Tumor 2001. Hx of Seizures    Limitations Seizures. Fall Risk    Currently in Pain? No/denies             TREATMENT:  Prolonged supine stretch for neck and shoulder muscles  Wrist Extension x 12 for increasing wrist extension strengthening and movement. Pt goes into wrist flexion with composite extension of LUE hand. Worked on isolating movements of composite extension and wrist flexion.  Upright sitting posture w OT behind on physioball for facilitation of thoracic extension and neck retraction  Hugging Physioball x 30 seconds x 2 for stretch   Pt reports shaving himself now, bathing with supervision, dressing with mod I and increased time.   Functional Reach with  LUE reaching forward for cones and placing to left and back. Mod verbal and tactile cues for positioning                OT Short Term Goals - 02/23/21 2008      OT SHORT TERM GOAL #1   Title Pt will be independent with initial HEP 01/26/2021    Time 4    Period Weeks    Status Achieved   coordination HEP   Target Date 01/26/21      OT SHORT TERM GOAL #2   Title Pt will improve wrist extension in LUE to 35 degrees for improving functional use of LUE.    Baseline LUE 30 passive - unable to maintain position actively.    Time 4    Period Weeks    Status Achieved      OT SHORT TERM GOAL #3   Title Pt will improve attention, balance and range of motion in order to complete toilet hygiene with no physical assistance.    Time 4    Period Weeks    Status On-going   OT encouraged patient to try and complete first before having assistance.     OT SHORT TERM GOAL #4   Title Pt will perform home management and/or simple meal prep with supervision and good safety awareness  Time 4    Period Weeks    Status Deferred   pt living with parents and not a goal at this time.     OT SHORT TERM GOAL #5   Title Pt will perform all basic ADLs without physical assistance.    Time 4    Period Weeks    Status On-going      OT SHORT TERM GOAL #6   Title Pt will improve functional use of LUE by scoring 25 or greater on Box and Blocks with LUE.    Baseline RUE 41, LUE 18    Time 4    Period Weeks    Status On-going   LUE 20 blocks 02/17/21            OT Long Term Goals - 02/23/21 2008      OT LONG TERM GOAL #1   Title Pt will be independent with updated HEP    Time 12    Period Weeks    Status On-going      OT LONG TERM GOAL #2   Title Pt will complete environmental scanning with 95% accuracy.    Time 12    Period Weeks    Status On-going      OT LONG TERM GOAL #3   Title Pt will attend to a novel cognitive task for 15 minutes or greater in a moderately distracting  environment with no cues for redirection.    Time 12    Period Weeks    Status On-going      OT LONG TERM GOAL #4   Title Pt will achieve wrist extension of 45 degrees or greater in LUE    Baseline 30*    Time 12    Period Weeks    Status Achieved      OT LONG TERM GOAL #5   Title Pt will improve grip strength in LUE to 30 lbs or greater for increase in ability to complete clothing management and other functional tasks with LUE.    Baseline LUE 18 RUE 41    Time 12    Period Weeks    Status On-going      OT LONG TERM GOAL #6   Title Pt will improve fine motor coordination in LUE by completing 9 hole peg test in 2 minutes or less    Baseline 2 pegs in 2 minutes    Time 12    Period Weeks    Status On-going      OT LONG TERM GOAL #7   Title Pt will complete FOTO And score 66% or greater.    Baseline 54%    Time 12    Period Weeks    Status On-going                 Plan - 02/24/21 1251    Clinical Impression Statement Slow and steady progress towards goals. Improved upright sitting posture this day.    OT Occupational Profile and History Detailed Assessment- Review of Records and additional review of physical, cognitive, psychosocial history related to current functional performance    Occupational performance deficits (Please refer to evaluation for details): ADL's    Body Structure / Function / Physical Skills ADL;ROM;UE functional use;FMC;Decreased knowledge of use of DME;Balance;Vision;Dexterity;GMC;Coordination;IADL;Proprioception;Tone;Strength;Pain;Endurance    Cognitive Skills Attention;Safety Awareness;Sequencing;Orientation;Memory;Understand;Problem Solve    Rehab Potential Good    Clinical Decision Making Several treatment options, min-mod task modification necessary    Comorbidities Affecting Occupational Performance: May have comorbidities impacting  occupational performance    Modification or Assistance to Complete Evaluation  No modification of tasks or  assist necessary to complete eval    OT Frequency 2x / week    OT Duration 12 weeks   24 visits + eval   OT Treatment/Interventions Self-care/ADL training;Therapeutic exercise;Visual/perceptual remediation/compensation;Patient/family education;Splinting;Neuromuscular education;Moist Heat;Aquatic Therapy;DME and/or AE instruction;Passive range of motion;Therapist, nutritional;Therapeutic activities;Energy conservation;Electrical Stimulation;Manual Therapy    Plan Review initial HEP and add/update prn, aquatics for postural realignment and activation, balance, gross motor coordiantion, and LUE fuctional ROM.    Recommended Other Services consider ST?    Consulted and Agree with Plan of Care Patient;Family member/caregiver   mom and dad   Family Member Consulted Mom and Dad           Patient will benefit from skilled therapeutic intervention in order to improve the following deficits and impairments:   Body Structure / Function / Physical Skills: ADL,ROM,UE functional use,FMC,Decreased knowledge of use of DME,Balance,Vision,Dexterity,GMC,Coordination,IADL,Proprioception,Tone,Strength,Pain,Endurance Cognitive Skills: Attention,Safety Awareness,Sequencing,Orientation,Memory,Understand,Problem Solve     Visit Diagnosis: Hemiplegia and hemiparesis following other cerebrovascular disease affecting left non-dominant side ([facility])  Unsteadiness on feet  Muscle weakness (generalized)  Other symptoms and signs involving the nervous system  Other lack of coordination  Visuospatial deficit  Stiffness of left shoulder, not elsewhere classified  Attention and concentration deficit    Problem List Patient Active Problem List   Diagnosis Date Noted  . Slow transit constipation   . Sleep disturbance   . Basal ganglia stroke ([facility])   . Incontinence of feces   . Leukocytosis   . Seizures ([facility])   . Spastic hemiparesis ([facility])   . Cerebrovascular accident (CVA) of right basal ganglia ([facility] [facility])  09/30/2020  . CVA (cerebral vascular accident) ([facility]) 09/26/2020  . Gait disorder 10/12/2016  . Memory loss 10/12/2016  . Epilepsy ([facility]) 03/30/2013  . Malignant neoplasm of brain ([facility]) 03/30/2013  . Astrocytoma brain tumor [facility]) 03/27/2012    Zachery Conch MOT, OTR/L  02/24/2021, 12:54 PM  [facility] [address] [place], [place], [address] Phone: [phone]   Fax:  [phone]  Name: Howard White MRN: [id number] Date of Birth: 1961/07/05

## 2021-02-24 NOTE — Patient Instructions (Signed)
Flexibility: Neck Retraction    Pull head straight back, keeping eyes and jaw level. Repeat ____ times per set. Do ____ sets per session. Do ____ sessions per day.   NECK: Rotation    Rotate head to look over shoulder. ___ reps per set, ___ sets per day, ___ days per week   Copyright  VHI. All rights reserved.   http://orth.exer.us/344   Copyright  VHI. All rights reserved.

## 2021-02-26 ENCOUNTER — Ambulatory Visit: Payer: PPO | Admitting: Physical Therapy

## 2021-02-26 ENCOUNTER — Other Ambulatory Visit: Payer: Self-pay

## 2021-02-26 ENCOUNTER — Encounter: Payer: Self-pay | Admitting: Physical Therapy

## 2021-02-26 ENCOUNTER — Encounter: Payer: PPO | Admitting: Occupational Therapy

## 2021-02-26 DIAGNOSIS — R296 Repeated falls: Secondary | ICD-10-CM

## 2021-02-26 DIAGNOSIS — R2681 Unsteadiness on feet: Secondary | ICD-10-CM

## 2021-02-26 DIAGNOSIS — R29818 Other symptoms and signs involving the nervous system: Secondary | ICD-10-CM

## 2021-02-26 DIAGNOSIS — I69854 Hemiplegia and hemiparesis following other cerebrovascular disease affecting left non-dominant side: Secondary | ICD-10-CM

## 2021-02-26 DIAGNOSIS — R2689 Other abnormalities of gait and mobility: Secondary | ICD-10-CM

## 2021-02-26 DIAGNOSIS — R278 Other lack of coordination: Secondary | ICD-10-CM

## 2021-02-26 NOTE — Therapy (Signed)
[facility] [address] [place], [place], [address] Phone: [phone]   Fax:  [phone]  Physical Therapy Treatment  Patient Details  Name: Howard White MRN: [id number] Date of Birth: Oct 06, 1961 Referring Provider (PT): Letta Pate Luanna Salk, MD   Encounter Date: 02/26/2021   PT End of Session - 02/26/21 1326    Visit Number 16    Number of Visits 29    Date for PT Re-Evaluation 04/20/21    Authorization Type HTA - 10th visit PN    Progress Note Due on Visit 20    PT Start Time 1026    PT Stop Time 1103    PT Time Calculation (min) 37 min    Equipment Utilized During Treatment Gait belt    Activity Tolerance Patient tolerated treatment well    Behavior During Therapy Impulsive;WFL for tasks assessed/performed           Past Medical History:  Diagnosis Date  . Brain cancer ([place])   . Seizures ([place])   . Thyroid disease    Hypothyroid    Past Surgical History:  Procedure Laterality Date  . brain cancer    . BRAIN SURGERY      There were no vitals filed for this visit.   Subjective Assessment - 02/26/21 1028    Subjective Pt reports he is a little sore from previous session.  He did not take his baclofen this morning because it will make him sleepy so he is planning to take it when he gets home.    Patient is accompained by: Family member   mom   Pertinent History Epilepsy, malignant neoplasm of brain, astrocytoma brain tumor, thyroid disease, brain surgery    Limitations Standing;Walking    Patient Stated Goals Work on his balance and be able to walk by himself.    Currently in Pain? No/denies                             [facility] Adult PT Treatment/Exercise - 02/26/21 1143      Ambulation/Gait   Ambulation/Gait Yes    Ambulation/Gait Assistance 4: Min guard    Ambulation/Gait Assistance Details Verbal cueing proper AD use and slowing down gait for proper patterning    Ambulation  Distance (Feet) 230 Feet    Assistive device Straight cane    Gait Pattern Step-through pattern;Decreased arm swing - left;Decreased step length - left;Decreased stance time - left    Ambulation Surface Level;Indoor      Neuro Re-ed    Neuro Re-ed Details  Standing at counter top; x15 each lateral weighshifting with UE overhead reach to facilitate lateral trunk and pelvic shift.  Compensations noted with forward flexion - corrected wiht verbal and tactile cues at pelvis.  Excessive knee flexion noted during L lean - tactile cueing at knee joint to prevent flexion.  2x30 sec modified tandem stance (L behind) with facilitation at hips to shift weight posterior and L.  verbal and tactile cueing at pelvis, shoulder and quad to maintian posture and proper alignment.  x15 each ant/post weightshiting in modified tandem.  Constant facilitation at pelvis to prevent excessive rotation.  Facilitation with yoga block to shift weight forward and prevent lateral hip drop.  x5 10sec iso hold with ant weightshift in modified tandem - without UE supported noted improvement in glute med activation               PT  Education - 02/26/21 1326    Education Details Continuing HEP to be performed once in the morning and the evening while not scheduled for therapy next week; scheduled more visits for PT, OT and aquatic.    Person(s) Educated Patient;Parent(s)    Methods Explanation    Comprehension Verbalized understanding            PT Short Term Goals - 02/19/21 1320      PT SHORT TERM GOAL #1   Title Pt will report compliance with updated HEP with parental supervision 5x a week    Time 4    Period Weeks    Status New    Target Date 03/21/21      PT SHORT TERM GOAL #2   Title Pt will perform all stand pivot transfers mod I    Time 4    Period Weeks    Status New    Target Date 03/21/21      PT SHORT TERM GOAL #3   Title Pt will improve BERG to >/= 46/56 to indicate decreased falls risk     Baseline 41/56    Time 4    Period Weeks    Status New    Target Date 03/21/21      PT SHORT TERM GOAL #4   Title Pt will demonstrate decreased falls risk when ambulating with cane as indicated by increase in DGI to >/= 11/24    Baseline 10/24    Time 4    Period Weeks    Status Revised    Target Date 03/21/21             PT Long Term Goals - 02/19/21 1324      PT LONG TERM GOAL #1   Title Pt will demonstrate ability to perform final HEP and walking program with supervision    Baseline Updated HEP and initiated walking at home with supervision    Time 8    Period Weeks    Status On-going    Target Date 04/20/21      PT LONG TERM GOAL #2   Title Pt will report improvement in LE function on FOTO to >/= 66%    Baseline 54%    Time 8    Period Weeks    Status On-going    Target Date 04/20/21      PT LONG TERM GOAL #3   Title Pt will be able to ambulate in and out of clinic with [place] and supervision    Time 8    Period Weeks    Status New    Target Date 04/20/21      PT LONG TERM GOAL #4   Title Pt will increase BERG to >/= 51/56    Baseline 41/56    Time 8    Period Weeks    Status New    Target Date 04/20/21      PT LONG TERM GOAL #5   Title Pt will increase DGI to >/= 14/24    Baseline 10/24    Time 8    Period Weeks    Status Revised    Target Date 04/20/21      PT LONG TERM GOAL #6   Title Pt will demonstrate ability to safely stand from the floor with supervision and use of UE    Time 8    Period Weeks    Status New    Target Date 04/20/21      PT LONG TERM  GOAL #7   Title Pt will ambulate x 500' outside with LRAD and supervision and will negotiate 12 stairs with R rail (ascending) with supervision to be able to access upstairs bedroom    Time 8    Period Weeks    Status On-going    Target Date 04/20/21                 Plan - 02/26/21 1328    Clinical Impression Statement Progressed to standing exercises focusing on maintaining  neutral pelvic alignment, weightshifting and midline orientation.  Constant therapist facilitation needed at pelvis to prevent excessive rotation and initiate proper weightshifting.  Needed facilitation of proper AD use during gait following completion of other standing exercises due to fatigue and reverting to more impulsive behaviors.  Pt did not require seated rest break between standing exercises displaying significant improvement in endurance and tolerance for activity.    Personal Factors and Comorbidities Comorbidity 3+;Past/Current Experience;Social Background    Comorbidities Epilepsy, malignant neoplasm of brain, astrocytoma brain tumor, thyroid disease, brain surgery, lives with elderly parents    Examination-Activity Limitations [place];Locomotion Level;Stairs;Transfers;Bathing;Toileting    Examination-Participation Restrictions Community Activity;Meal Prep;Shop    Stability/Clinical Decision Making Evolving/Moderate complexity    Rehab Potential Good    PT Frequency 2x / week    PT Duration 8 weeks    PT Treatment/Interventions ADLs/Self Care Home Management;Aquatic Therapy;DME Instruction;Gait training;Cognitive remediation;Stair training;Patient/family education;Functional mobility training;Orthotic Fit/Training;Therapeutic activities;Therapeutic exercise;Balance training;Neuromuscular re-education;Passive range of motion;Vestibular;Visual/perceptual remediation/compensation    PT Next Visit Plan Standing at counter and reaching to encourage trunk elongation - more functional movements.  Gait training/safety with cane; stair negotiation - work on weaning off of wheelchair.  resisted sit <> stand, NMR/WB focusing on attention to L, tall kneeling, balance when turning, reorientation to midline/weight shifting/head and trunk righting.    PT Home Exercise Plan Access Code: [email or web address]    Consulted and Agree with Plan of Care Patient;Family member/caregiver    Family Member Consulted parents            Patient will benefit from skilled therapeutic intervention in order to improve the following deficits and impairments:  Abnormal gait,Decreased balance,Decreased cognition,Decreased coordination,Difficulty walking,Impaired tone,Impaired UE functional use,Impaired vision/preception,Postural dysfunction,Pain  Visit Diagnosis: No diagnosis found.     Problem List Patient Active Problem List   Diagnosis Date Noted  . Slow transit constipation   . Sleep disturbance   . Basal ganglia stroke ([place])   . Incontinence of feces   . Leukocytosis   . Seizures ([place])   . Spastic hemiparesis ([place])   . Cerebrovascular accident (CVA) of right basal ganglia ([place]) 09/30/2020  . CVA (cerebral vascular accident) ([place]) 09/26/2020  . Gait disorder 10/12/2016  . Memory loss 10/12/2016  . Epilepsy ([place]) 03/30/2013  . Malignant neoplasm of brain ([place]) 03/30/2013  . Astrocytoma brain tumor [facility]) 03/27/2012    Yetta Numbers, SPT 02/26/2021, 1:36 PM  [facility] [address] [place], [place], [address] Phone: [phone]   Fax:  [phone]  Name: Howard White MRN: [id number] Date of Birth: 02-09-1961

## 2021-02-27 DIAGNOSIS — I6381 Other cerebral infarction due to occlusion or stenosis of small artery: Secondary | ICD-10-CM | POA: Diagnosis not present

## 2021-02-27 DIAGNOSIS — R159 Full incontinence of feces: Secondary | ICD-10-CM | POA: Diagnosis not present

## 2021-03-03 ENCOUNTER — Other Ambulatory Visit: Payer: Self-pay

## 2021-03-03 ENCOUNTER — Ambulatory Visit: Payer: PPO | Admitting: Occupational Therapy

## 2021-03-03 ENCOUNTER — Encounter: Payer: Self-pay | Admitting: Occupational Therapy

## 2021-03-03 DIAGNOSIS — M25612 Stiffness of left shoulder, not elsewhere classified: Secondary | ICD-10-CM

## 2021-03-03 DIAGNOSIS — M6281 Muscle weakness (generalized): Secondary | ICD-10-CM

## 2021-03-03 DIAGNOSIS — I69854 Hemiplegia and hemiparesis following other cerebrovascular disease affecting left non-dominant side: Secondary | ICD-10-CM

## 2021-03-03 DIAGNOSIS — R278 Other lack of coordination: Secondary | ICD-10-CM

## 2021-03-03 NOTE — Therapy (Signed)
[facility] [address] [place], [place], [address] Phone: [phone]   Fax:  [phone]  Occupational Therapy Treatment  Patient Details  Name: Howard White MRN: [id number] Date of Birth: 1960-12-20 Referring Provider (OT): Alysia Penna, MD   Encounter Date: 03/03/2021   OT End of Session - 03/03/21 1704    Visit Number 17    Number of Visits 25    Date for OT Re-Evaluation 03/23/21    Authorization Type [employer]    Authorization Time Period $15 copay for OT, VL:MN    OT Start Time 1703    OT Stop Time 1743    OT Time Calculation (min) 40 min    Activity Tolerance Patient tolerated treatment well    Behavior During Therapy [facility] for tasks assessed/performed           Past Medical History:  Diagnosis Date  . Brain cancer ([place])   . Seizures ([place])   . Thyroid disease    Hypothyroid    Past Surgical History:  Procedure Laterality Date  . brain cancer    . BRAIN SURGERY      There were no vitals filed for this visit.   Subjective Assessment - 03/03/21 1705    Subjective  Pt has some pain on his side.    Patient is accompanied by: Family member    Pertinent History Brain Tumor 2001. Hx of Seizures    Limitations Seizures. Fall Risk    Currently in Pain? Yes    Pain Score 3     Pain Location Abdomen    Pain Orientation Right    Pain Descriptors / Indicators Dull    Pain Type Acute pain    Pain Onset In the past 7 days    Pain Frequency Intermittent             TREATMENT  ADLs pt reports dressing self and shaving himself. Pt requires assistance for tub transfers and toilet hygiene still.  Prone for thoracic and cervical extension. Prone exercises x 10 - shoulder extension, scapula retraction   Standing and weight bearing on mat with forward/backward weight shifting  Reaching with LUE with resistance clothespins yellow, red and green with word finding/categorization with  items matching color. Pt req'd mod cues for reducing shoulder hiking and elevation and lateral flexion to right with reaching/compensation.   Standing Posture with use of hemi glide and mirror for increase in overall standing upright and working on standing with midline positioning. Mod tactile and verbal cues.                    OT Short Term Goals - 02/23/21 2008      OT SHORT TERM GOAL #1   Title Pt will be independent with initial HEP 01/26/2021    Time 4    Period Weeks    Status Achieved   coordination HEP   Target Date 01/26/21      OT SHORT TERM GOAL #2   Title Pt will improve wrist extension in LUE to 35 degrees for improving functional use of LUE.    Baseline LUE 30 passive - unable to maintain position actively.    Time 4    Period Weeks    Status Achieved      OT SHORT TERM GOAL #3   Title Pt will improve attention, balance and range of motion in order to complete toilet hygiene with no physical assistance.    Time 4  Period Weeks    Status On-going   OT encouraged patient to try and complete first before having assistance.     OT SHORT TERM GOAL #4   Title Pt will perform home management and/or simple meal prep with supervision and good safety awareness    Time 4    Period Weeks    Status Deferred   pt living with parents and not a goal at this time.     OT SHORT TERM GOAL #5   Title Pt will perform all basic ADLs without physical assistance.    Time 4    Period Weeks    Status On-going      OT SHORT TERM GOAL #6   Title Pt will improve functional use of LUE by scoring 25 or greater on Box and Blocks with LUE.    Baseline RUE 41, LUE 18    Time 4    Period Weeks    Status On-going   LUE 20 blocks 02/17/21            OT Long Term Goals - 02/23/21 2008      OT LONG TERM GOAL #1   Title Pt will be independent with updated HEP    Time 12    Period Weeks    Status On-going      OT LONG TERM GOAL #2   Title Pt will complete  environmental scanning with 95% accuracy.    Time 12    Period Weeks    Status On-going      OT LONG TERM GOAL #3   Title Pt will attend to a novel cognitive task for 15 minutes or greater in a moderately distracting environment with no cues for redirection.    Time 12    Period Weeks    Status On-going      OT LONG TERM GOAL #4   Title Pt will achieve wrist extension of 45 degrees or greater in LUE    Baseline 30*    Time 12    Period Weeks    Status Achieved      OT LONG TERM GOAL #5   Title Pt will improve grip strength in LUE to 30 lbs or greater for increase in ability to complete clothing management and other functional tasks with LUE.    Baseline LUE 18 RUE 41    Time 12    Period Weeks    Status On-going      OT LONG TERM GOAL #6   Title Pt will improve fine motor coordination in LUE by completing 9 hole peg test in 2 minutes or less    Baseline 2 pegs in 2 minutes    Time 12    Period Weeks    Status On-going      OT LONG TERM GOAL #7   Title Pt will complete FOTO And score 66% or greater.    Baseline 54%    Time 12    Period Weeks    Status On-going                 Plan - 03/03/21 1756    Clinical Impression Statement Pt is making progress towards goals.    OT Occupational Profile and History Detailed Assessment- Review of Records and additional review of physical, cognitive, psychosocial history related to current functional performance    Occupational performance deficits (Please refer to evaluation for details): ADL's    Body Structure / Function / Physical Skills ADL;ROM;UE functional  use;FMC;Decreased knowledge of use of DME;Balance;Vision;Dexterity;GMC;Coordination;IADL;Proprioception;Tone;Strength;Pain;Endurance    Cognitive Skills Attention;Safety Awareness;Sequencing;Orientation;Memory;Understand;Problem Solve    Rehab Potential Good    Clinical Decision Making Several treatment options, min-mod task modification necessary    Comorbidities  Affecting Occupational Performance: May have comorbidities impacting occupational performance    Modification or Assistance to Complete Evaluation  No modification of tasks or assist necessary to complete eval    OT Frequency 2x / week    OT Duration 12 weeks   24 visits + eval   OT Treatment/Interventions Self-care/ADL training;Therapeutic exercise;Visual/perceptual remediation/compensation;Patient/family education;Splinting;Neuromuscular education;Moist Heat;Aquatic Therapy;DME and/or AE instruction;Passive range of motion;Therapist, nutritional;Therapeutic activities;Energy conservation;Electrical Stimulation;Manual Therapy    Plan Review initial HEP and add/update prn, aquatics for postural realignment and activation, balance, gross motor coordiantion, and LUE fuctional ROM.    Recommended Other Services consider ST?    Consulted and Agree with Plan of Care Patient;Family member/caregiver   mom and dad   Family Member Consulted Mom and Dad           Patient will benefit from skilled therapeutic intervention in order to improve the following deficits and impairments:   Body Structure / Function / Physical Skills: ADL,ROM,UE functional use,FMC,Decreased knowledge of use of DME,Balance,Vision,Dexterity,GMC,Coordination,IADL,Proprioception,Tone,Strength,Pain,Endurance Cognitive Skills: Attention,Safety Awareness,Sequencing,Orientation,Memory,Understand,Problem Solve     Visit Diagnosis: Other lack of coordination  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 Slow transit constipation   . Sleep disturbance   . Basal ganglia stroke ([place])   . Incontinence of feces   . Leukocytosis   . Seizures ([place])   . Spastic hemiparesis ([place])   . Cerebrovascular accident  (CVA) of right basal ganglia ([place]) 09/30/2020  . CVA (cerebral vascular accident) ([place]) 09/26/2020  . Gait disorder 10/12/2016  . Memory loss 10/12/2016  . Epilepsy ([place]) 03/30/2013  . Malignant neoplasm of brain ([place]) 03/30/2013  . Astrocytoma brain tumor [facility]) 03/27/2012    Zachery Conch MOT, OTR/L  03/03/2021, 5:57 PM  [facility] [address] [place], [place], [address] Phone: [phone]   Fax:  [phone]  Name: Howard White MRN: [id number] Date of Birth: 08-02-1961

## 2021-03-05 ENCOUNTER — Ambulatory Visit: Payer: PPO | Admitting: Occupational Therapy

## 2021-03-05 ENCOUNTER — Other Ambulatory Visit: Payer: Self-pay

## 2021-03-05 ENCOUNTER — Encounter: Payer: Self-pay | Admitting: Occupational Therapy

## 2021-03-05 DIAGNOSIS — I69854 Hemiplegia and hemiparesis following other cerebrovascular disease affecting left non-dominant side: Secondary | ICD-10-CM

## 2021-03-05 DIAGNOSIS — R278 Other lack of coordination: Secondary | ICD-10-CM

## 2021-03-05 DIAGNOSIS — M6281 Muscle weakness (generalized): Secondary | ICD-10-CM

## 2021-03-05 DIAGNOSIS — M25612 Stiffness of left shoulder, not elsewhere classified: Secondary | ICD-10-CM

## 2021-03-05 NOTE — Therapy (Signed)
[facility] [address] [place], [place], [address] Phone: [phone]   Fax:  [phone]  Occupational Therapy Treatment  Patient Details  Name: Howard White MRN: [id number] Date of Birth: 1961-01-30 Referring Provider (OT): Alysia Penna, MD   Encounter Date: 03/05/2021   OT End of Session - 03/05/21 1323    Visit Number 18    Number of Visits 25    Date for OT Re-Evaluation 03/23/21    Authorization Type [employer]    Authorization Time Period $15 copay for OT, VL:MN    OT Start Time 5462    OT Stop Time 1350    OT Time Calculation (min) 43 min    Activity Tolerance Patient tolerated treatment well    Behavior During Therapy [facility] for tasks assessed/performed           Past Medical History:  Diagnosis Date  . Brain cancer ([place])   . Seizures ([place])   . Thyroid disease    Hypothyroid    Past Surgical History:  Procedure Laterality Date  . brain cancer    . BRAIN SURGERY      There were no vitals filed for this visit.   Subjective Assessment - 03/05/21 1310    Subjective  "got the new toilet and I went #1 by myself"    Patient is accompanied by: Family member    Pertinent History Brain Tumor 2001. Hx of Seizures    Limitations Seizures. Fall Risk    Currently in Pain? No/denies             TREATMENT  Sitting on wobble disc with emphasis on encouraging upright seated posture and increase in core strengthening  Jenga with LUE for stacking and placing blocks with max verbal cueing for shoulder pattern and reducing abduction and compensatory movements.   Supine x 10 shoulder flexion - closed chain with ball  Seated x 10 shoulder flexion and chest press - closed chain with ball  Prone propped on forearms. X 10 scap protraction/retraction. Worked on maintaining weight bearing on forearms in prone on LUE while reaching with RUE to obtain cylindrical colored pegs.   LUE functional  coordination with reaching low level while seated edge of mat to place cylindrical colored pegs.                   OT Short Term Goals - 02/23/21 2008      OT SHORT TERM GOAL #1   Title Pt will be independent with initial HEP 01/26/2021    Time 4    Period Weeks    Status Achieved   coordination HEP   Target Date 01/26/21      OT SHORT TERM GOAL #2   Title Pt will improve wrist extension in LUE to 35 degrees for improving functional use of LUE.    Baseline LUE 30 passive - unable to maintain position actively.    Time 4    Period Weeks    Status Achieved      OT SHORT TERM GOAL #3   Title Pt will improve attention, balance and range of motion in order to complete toilet hygiene with no physical assistance.    Time 4    Period Weeks    Status On-going   OT encouraged patient to try and complete first before having assistance.     OT SHORT TERM GOAL #4   Title Pt will perform home management and/or simple meal prep with supervision  and good safety awareness    Time 4    Period Weeks    Status Deferred   pt living with parents and not a goal at this time.     OT SHORT TERM GOAL #5   Title Pt will perform all basic ADLs without physical assistance.    Time 4    Period Weeks    Status On-going      OT SHORT TERM GOAL #6   Title Pt will improve functional use of LUE by scoring 25 or greater on Box and Blocks with LUE.    Baseline RUE 41, LUE 18    Time 4    Period Weeks    Status On-going   LUE 20 blocks 02/17/21            OT Long Term Goals - 02/23/21 2008      OT LONG TERM GOAL #1   Title Pt will be independent with updated HEP    Time 12    Period Weeks    Status On-going      OT LONG TERM GOAL #2   Title Pt will complete environmental scanning with 95% accuracy.    Time 12    Period Weeks    Status On-going      OT LONG TERM GOAL #3   Title Pt will attend to a novel cognitive task for 15 minutes or greater in a moderately distracting  environment with no cues for redirection.    Time 12    Period Weeks    Status On-going      OT LONG TERM GOAL #4   Title Pt will achieve wrist extension of 45 degrees or greater in LUE    Baseline 30*    Time 12    Period Weeks    Status Achieved      OT LONG TERM GOAL #5   Title Pt will improve grip strength in LUE to 30 lbs or greater for increase in ability to complete clothing management and other functional tasks with LUE.    Baseline LUE 18 RUE 41    Time 12    Period Weeks    Status On-going      OT LONG TERM GOAL #6   Title Pt will improve fine motor coordination in LUE by completing 9 hole peg test in 2 minutes or less    Baseline 2 pegs in 2 minutes    Time 12    Period Weeks    Status On-going      OT LONG TERM GOAL #7   Title Pt will complete FOTO And score 66% or greater.    Baseline 54%    Time 12    Period Weeks    Status On-going                 Plan - 03/05/21 1440    Clinical Impression Statement Good response from prone today during session for thoracic extension and working on scapula stabilization    OT Occupational Profile and History Detailed Assessment- Review of Records and additional review of physical, cognitive, psychosocial history related to current functional performance    Occupational performance deficits (Please refer to evaluation for details): ADL's    Body Structure / Function / Physical Skills ADL;ROM;UE functional use;FMC;Decreased knowledge of use of DME;Balance;Vision;Dexterity;GMC;Coordination;IADL;Proprioception;Tone;Strength;Pain;Endurance    Cognitive Skills Attention;Safety Awareness;Sequencing;Orientation;Memory;Understand;Problem Solve    Rehab Potential Good    Clinical Decision Making Several treatment options, min-mod task modification necessary  Comorbidities Affecting Occupational Performance: May have comorbidities impacting occupational performance    Modification or Assistance to Complete Evaluation  No  modification of tasks or assist necessary to complete eval    OT Frequency 2x / week    OT Duration 12 weeks   24 visits + eval   OT Treatment/Interventions Self-care/ADL training;Therapeutic exercise;Visual/perceptual remediation/compensation;Patient/family education;Splinting;Neuromuscular education;Moist Heat;Aquatic Therapy;DME and/or AE instruction;Passive range of motion;Therapist, nutritional;Therapeutic activities;Energy conservation;Electrical Stimulation;Manual Therapy    Plan Review initial HEP and add/update prn, aquatics for postural realignment and activation, balance, gross motor coordiantion, and LUE fuctional ROM, prone    Recommended Other Services consider ST?    Consulted and Agree with Plan of Care Patient;Family member/caregiver   mom and dad   Family Member Consulted Mom and Dad           Patient will benefit from skilled therapeutic intervention in order to improve the following deficits and impairments:   Body Structure / Function / Physical Skills: ADL,ROM,UE functional use,FMC,Decreased knowledge of use of DME,Balance,Vision,Dexterity,GMC,Coordination,IADL,Proprioception,Tone,Strength,Pain,Endurance Cognitive Skills: Attention,Safety Awareness,Sequencing,Orientation,Memory,Understand,Problem Solve     Visit Diagnosis: Other lack of coordination  Hemiplegia and hemiparesis following other cerebrovascular disease affecting left non-dominant side (HCC)  Stiffness of left shoulder, not elsewhere classified  Muscle weakness (generalized)    Problem List Patient Active Problem List   Diagnosis Date Noted  . Slow transit constipation   . Sleep disturbance   . Basal ganglia stroke ([place])   . Incontinence of feces   . Leukocytosis   . Seizures ([place])   . Spastic hemiparesis ([place])   . Cerebrovascular accident (CVA) of right basal ganglia ([place]) 09/30/2020  . CVA (cerebral vascular accident) ([place]) 09/26/2020  . Gait disorder 10/12/2016  . Memory loss  10/12/2016  . Epilepsy ([place]) 03/30/2013  . Malignant neoplasm of brain ([place]) 03/30/2013  . Astrocytoma brain tumor [facility]) 03/27/2012    Zachery Conch MOT, OTR/L  03/05/2021, 3:14 PM  [facility] [address] [place], [place], [address] Phone: [phone]   Fax:  [phone]  Name: Howard White MRN: [id number] Date of Birth: 03-01-1961

## 2021-03-06 ENCOUNTER — Other Ambulatory Visit: Payer: Self-pay

## 2021-03-06 ENCOUNTER — Encounter: Payer: PPO | Attending: Registered Nurse | Admitting: Physical Medicine & Rehabilitation

## 2021-03-06 ENCOUNTER — Encounter: Payer: Self-pay | Admitting: Physical Medicine & Rehabilitation

## 2021-03-06 VITALS — BP 103/70 | HR 68 | Ht 70.0 in | Wt 185.0 lb

## 2021-03-06 DIAGNOSIS — G8110 Spastic hemiplegia affecting unspecified side: Secondary | ICD-10-CM | POA: Diagnosis not present

## 2021-03-06 DIAGNOSIS — I6381 Other cerebral infarction due to occlusion or stenosis of small artery: Secondary | ICD-10-CM | POA: Insufficient documentation

## 2021-03-06 NOTE — Patient Instructions (Signed)
May take melatonin extended release

## 2021-03-06 NOTE — Progress Notes (Signed)
Subjective:    Patient ID: Howard White, male    DOB: 06-02-1961, 61 y.o.   MRN: [id number] Admit date: 09/30/2020 Discharge date: 10/29/2020 60 y.o. 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  Per chart review lives with elderly parents.  Independent prior to admission.  Presented 09/26/2020 left-sided weakness and facial droop.  Cranial CT scan showed no acute intracranial abnormality or significant interval change.  Left frontal craniotomy with resection cavity noted.  CT angiogram of head and neck without large vessel occlusion or limiting proximal stenosis.  MRI of the brain showed a 1.9 x 1.2 cm acute infarct extending from the right corona radiata into the right basal ganglia.  Echocardiogram with ejection fraction of 60 to 65% no wall motion abnormalities.  Admission chemistries unremarkable except potassium 5.5.  Maintained on aspirin and Plavix for CVA prophylaxis x3 weeks then aspirin alone. HPI  Patient returns today with his mother for physical medicine and rehabilitation follow-up.  He is undergoing outpatient PT OT as well as aquatic therapy.  He is trying to slow down on his movements.  He has had no recent falls.  He is able to perform basic ADLs.  He still needs supervision for ambulation. Pain Inventory Average Pain 2 Pain Right Now 2 My pain is not sure  In the last 24 hours, has pain interfered with the following? General activity 3 Relation with others 2 Enjoyment of life 8 What TIME of day is your pain at its worst? morning  Sleep (in general) Fair  Pain is worse with: some activites Pain improves with: rest Relief from Meds: 1  Family History  Problem Relation Age of Onset  . Brain cancer Other   . Thyroid disease Other   . Cancer Other   . Diabetes Other   . Stroke Other    Social History    Socioeconomic History  . Marital status: Divorced    Spouse name: Not on file  . Number of children: 0  . Years of education: Masters  . Highest education level: Not on file  Occupational History  . Occupation: Disability  Tobacco Use  . Smoking status: Never Smoker  . Smokeless tobacco: Never Used  Substance and Sexual Activity  . Alcohol use: Yes    Alcohol/week: 0.0 standard drinks    Comment: 2 beers a month  . Drug use: No  . Sexual activity: Not on file  Other Topics Concern  . Not on file  Social History Narrative   Patient lives at home with his parents.   Patient is on long term disability.    Patient is divorced.    Patient has no children.    Patient has his Master's in Business.    Patient is right-handed.   Social Determinants of Health   Financial Resource Strain: Not on file  Food Insecurity: Not on file  Transportation Needs: Not on file  Physical Activity: Not on file  Stress: Not on file  Social Connections: Not on file   Past Surgical History:  Procedure Laterality Date  . brain cancer    . BRAIN SURGERY     Past Surgical History:  Procedure Laterality Date  . brain cancer    . BRAIN SURGERY     Past Medical History:  Diagnosis Date  . Brain cancer ([place])   . Seizures ([place])   .  Thyroid disease    Hypothyroid   BP 103/70   Pulse 68   Ht 5\' 10"  (1.778 m)   Wt 185 lb (83.9 kg)   SpO2 96%   BMI 26.54 kg/m   Opioid Risk Score:   Fall Risk Score:  `1  Depression screen PHQ 2/9  Depression screen [facility] 2/9 03/06/2021 01/08/2021 11/27/2020  Decreased Interest 0 2 2  Down, Depressed, Hopeless 0 0 0  PHQ - 2 Score 0 2 2  Altered sleeping - - 1  Tired, decreased energy - - 1  Change in appetite - - 0  Feeling bad or failure about yourself  - - 0  Trouble concentrating - - 0  Moving slowly or fidgety/restless - - 0  Suicidal thoughts - - 0  PHQ-9 Score - - 4  Difficult doing work/chores - - Not difficult at all     Review of  Systems  Constitutional: Negative.   HENT: Negative.   Eyes: Negative.   Respiratory: Negative.   Cardiovascular: Negative.   Gastrointestinal: Negative.   Endocrine: Negative.   Genitourinary: Negative.   Musculoskeletal: Negative.   Skin: Negative.   Allergic/Immunologic: Negative.   Neurological: Negative.   Hematological: Negative.   Psychiatric/Behavioral: Negative.        Objective:   Physical Exam Vitals and nursing note reviewed.  Constitutional:      Appearance: He is normal weight.  HENT:     Head: Normocephalic and atraumatic.  Eyes:     Extraocular Movements: Extraocular movements intact.     Conjunctiva/sclera: Conjunctivae normal.     Pupils: Pupils are equal, round, and reactive to light.  Musculoskeletal:     Right lower leg: No edema.     Left lower leg: No edema.     Comments: No pain with left shoulder range of motion.  There is mildly limited abduction and forward flexion of the left shoulder.  Skin:    General: Skin is warm and dry.  Neurological:     General: No focal deficit present.     Mental Status: He is alert and oriented to person, place, and time.     Comments: Ambulates with standby assistance he clears his foot well on the left side step length has improved he still has wide-based gait. He does keep his left elbow in flexion during ambulation.  He is able to correct this with cueing however  he starts dragging his foot  Psychiatric:        Mood and Affect: Mood normal.        Behavior: Behavior normal.           Assessment & Plan:  #1.  Left spastic hemiparesis following right subcortical infarct in a patient with history of left frontal craniotomy for astrocytoma approximately 20 years ago.  Overall he is making good progress.  Continue outpatient PT OT I will see him back in 3 months could consider Botox of the left elbow flexor or even to the foot inverters on the left side

## 2021-03-09 ENCOUNTER — Ambulatory Visit: Payer: PPO | Admitting: Occupational Therapy

## 2021-03-09 ENCOUNTER — Encounter: Payer: Self-pay | Admitting: Occupational Therapy

## 2021-03-09 DIAGNOSIS — I69854 Hemiplegia and hemiparesis following other cerebrovascular disease affecting left non-dominant side: Secondary | ICD-10-CM | POA: Diagnosis not present

## 2021-03-09 NOTE — Therapy (Signed)
[facility] [address] [place], [place], [address] Phone: [phone]   Fax:  [phone]  Occupational Therapy Treatment  Patient Details  Name: Howard White MRN: [id number] Date of Birth: 18-Dec-1960 Referring Provider (OT): Alysia Penna, MD   Encounter Date: 03/09/2021   OT End of Session - 03/09/21 1716    Visit Number 19    Number of Visits 25    Date for OT Re-Evaluation 03/23/21    Authorization Type [employer]    Authorization Time Period $15 copay for OT, VL:MN    OT Start Time 1302    OT Stop Time 1355    OT Time Calculation (min) 53 min    Activity Tolerance Patient tolerated treatment well    Behavior During Therapy [facility] for tasks assessed/performed           Past Medical History:  Diagnosis Date  . Brain cancer ([place])   . Seizures ([place])   . Thyroid disease    Hypothyroid    Past Surgical History:  Procedure Laterality Date  . brain cancer    . BRAIN SURGERY      There were no vitals filed for this visit.   Subjective Assessment - 03/09/21 1715    Subjective  I need my arm to be looser, and improve my balance    Patient is accompanied by: Family member    Pertinent History Brain Tumor 2001. Hx of Seizures    Limitations Seizures. Fall Risk    Patient Stated Goals Walk again and use a cane. "foot, hand and balance"    Currently in Pain? No/denies    Pain Score 0-No pain            Patient seen for aquatic therapy visit.  Patient with improved ability to control rate of speed in water which has significantly improved safety.  Patient uses self cueing to "re-set" when off balance - now while loss of balance is still recoverable.   Worked on [employer] like movements with patient stabilized against the wall.  Patient needs mod cueing to follow exercise, but repetition helpful for improved performance.  Using buoyancy and depth of water to challenge balance yet also support UE's  to allow less stiffness in movement.   Patient with much improved ability to weight shift forward even in staggered stance today.                        OT Short Term Goals - 03/09/21 1718      OT SHORT TERM GOAL #1   Title Pt will be independent with initial HEP 01/26/2021    Time 4    Period Weeks    Status Achieved   coordination HEP   Target Date 01/26/21      OT SHORT TERM GOAL #2   Title Pt will improve wrist extension in LUE to 35 degrees for improving functional use of LUE.    Baseline LUE 30 passive - unable to maintain position actively.    Time 4    Period Weeks    Status Achieved      OT SHORT TERM GOAL #3   Title Pt will improve attention, balance and range of motion in order to complete toilet hygiene with no physical assistance.    Time 4    Period Weeks    Status On-going   OT encouraged patient to try and complete first before having assistance.  OT SHORT TERM GOAL #4   Title Pt will perform home management and/or simple meal prep with supervision and good safety awareness    Time 4    Period Weeks    Status Deferred   pt living with parents and not a goal at this time.     OT SHORT TERM GOAL #5   Title Pt will perform all basic ADLs without physical assistance.    Time 4    Period Weeks    Status On-going      OT SHORT TERM GOAL #6   Title Pt will improve functional use of LUE by scoring 25 or greater on Box and Blocks with LUE.    Baseline RUE 41, LUE 18    Time 4    Period Weeks    Status On-going   LUE 20 blocks 02/17/21            OT Long Term Goals - 03/09/21 1718      OT LONG TERM GOAL #1   Title Pt will be independent with updated HEP    Time 12    Period Weeks    Status On-going      OT LONG TERM GOAL #2   Title Pt will complete environmental scanning with 95% accuracy.    Time 12    Period Weeks    Status On-going      OT LONG TERM GOAL #3   Title Pt will attend to a novel cognitive task for 15 minutes or  greater in a moderately distracting environment with no cues for redirection.    Time 12    Period Weeks    Status On-going      OT LONG TERM GOAL #4   Title Pt will achieve wrist extension of 45 degrees or greater in LUE    Baseline 30*    Time 12    Period Weeks    Status Achieved      OT LONG TERM GOAL #5   Title Pt will improve grip strength in LUE to 30 lbs or greater for increase in ability to complete clothing management and other functional tasks with LUE.    Baseline LUE 18 RUE 41    Time 12    Period Weeks    Status On-going      OT LONG TERM GOAL #6   Title Pt will improve fine motor coordination in LUE by completing 9 hole peg test in 2 minutes or less    Baseline 2 pegs in 2 minutes    Time 12    Period Weeks    Status On-going      OT LONG TERM GOAL #7   Title Pt will complete FOTO And score 66% or greater.    Baseline 54%    Time 12    Period Weeks    Status On-going                 Plan - 03/09/21 1716    Clinical Impression Statement Patient with improved safety as he is slowing speed of movement.  Patient reports improved functional use of left hand to hold things.    OT Occupational Profile and History Detailed Assessment- Review of Records and additional review of physical, cognitive, psychosocial history related to current functional performance    Occupational performance deficits (Please refer to evaluation for details): ADL's    Body Structure / Function / Physical Skills ADL;ROM;UE functional use;FMC;Decreased knowledge of use of DME;Balance;Vision;Dexterity;GMC;Coordination;IADL;Proprioception;Tone;Strength;Pain;Endurance  Cognitive Skills Attention;Safety Awareness;Sequencing;Orientation;Memory;Understand;Problem Solve    Rehab Potential Good    Clinical Decision Making Several treatment options, min-mod task modification necessary    Comorbidities Affecting Occupational Performance: May have comorbidities impacting occupational  performance    Modification or Assistance to Complete Evaluation  No modification of tasks or assist necessary to complete eval    OT Frequency 2x / week    OT Duration 12 weeks   24 visits + eval   OT Treatment/Interventions Self-care/ADL training;Therapeutic exercise;Visual/perceptual remediation/compensation;Patient/family education;Splinting;Neuromuscular education;Moist Heat;Aquatic Therapy;DME and/or AE instruction;Passive range of motion;Therapist, nutritional;Therapeutic activities;Energy conservation;Electrical Stimulation;Manual Therapy    Plan Review initial HEP and add/update prn, aquatics for postural realignment and activation, balance, gross motor coordiantion, and LUE fuctional ROM, prone    Recommended Other Services consider ST?    Consulted and Agree with Plan of Care Patient;Family member/caregiver   mom and dad   Family Member Consulted Dad           Patient will benefit from skilled therapeutic intervention in order to improve the following deficits and impairments:   Body Structure / Function / Physical Skills: ADL,ROM,UE functional use,FMC,Decreased knowledge of use of DME,Balance,Vision,Dexterity,GMC,Coordination,IADL,Proprioception,Tone,Strength,Pain,Endurance Cognitive Skills: Attention,Safety Awareness,Sequencing,Orientation,Memory,Understand,Problem Solve     Visit Diagnosis: Other lack of coordination  Hemiplegia and hemiparesis following other cerebrovascular disease affecting left non-dominant side (HCC)  Stiffness of left shoulder, not elsewhere classified  Muscle weakness (generalized)  Unsteadiness on feet  Other symptoms and signs involving the nervous system  Visuospatial deficit  Attention and concentration deficit    Problem List Patient Active Problem List   Diagnosis Date Noted  . Slow transit constipation   . Sleep disturbance   . Basal ganglia stroke ([place])   . Incontinence of feces   . Leukocytosis   . Seizures ([place])   .  Spastic hemiparesis ([place])   . Cerebrovascular accident (CVA) of right basal ganglia ([place]) 09/30/2020  . CVA (cerebral vascular accident) ([place]) 09/26/2020  . Gait disorder 10/12/2016  . Memory loss 10/12/2016  . Epilepsy ([place]) 03/30/2013  . Malignant neoplasm of brain ([place]) 03/30/2013  . Astrocytoma brain tumor [facility]) 03/27/2012    Mariah Milling 03/09/2021, 5:19 PM  [facility] [address] [place], [place], [address] Phone: [phone]   Fax:  [phone]  Name: Aedyn Kempfer MRN: [id number] Date of Birth: May 13, 1961

## 2021-03-10 ENCOUNTER — Ambulatory Visit: Payer: PPO | Admitting: Physical Therapy

## 2021-03-10 ENCOUNTER — Encounter: Payer: Self-pay | Admitting: Adult Health

## 2021-03-10 ENCOUNTER — Other Ambulatory Visit: Payer: Self-pay

## 2021-03-10 ENCOUNTER — Encounter: Payer: Self-pay | Admitting: Physical Therapy

## 2021-03-10 ENCOUNTER — Ambulatory Visit: Payer: PPO | Admitting: Adult Health

## 2021-03-10 VITALS — BP 103/65 | HR 62 | Ht 70.0 in | Wt 188.0 lb

## 2021-03-10 DIAGNOSIS — G8114 Spastic hemiplegia affecting left nondominant side: Secondary | ICD-10-CM

## 2021-03-10 DIAGNOSIS — I639 Cerebral infarction, unspecified: Secondary | ICD-10-CM

## 2021-03-10 DIAGNOSIS — R2681 Unsteadiness on feet: Secondary | ICD-10-CM

## 2021-03-10 DIAGNOSIS — R2689 Other abnormalities of gait and mobility: Secondary | ICD-10-CM

## 2021-03-10 DIAGNOSIS — I69854 Hemiplegia and hemiparesis following other cerebrovascular disease affecting left non-dominant side: Secondary | ICD-10-CM | POA: Diagnosis not present

## 2021-03-10 DIAGNOSIS — R296 Repeated falls: Secondary | ICD-10-CM

## 2021-03-10 DIAGNOSIS — M6281 Muscle weakness (generalized): Secondary | ICD-10-CM

## 2021-03-10 DIAGNOSIS — R278 Other lack of coordination: Secondary | ICD-10-CM

## 2021-03-10 DIAGNOSIS — R29818 Other symptoms and signs involving the nervous system: Secondary | ICD-10-CM

## 2021-03-10 NOTE — Progress Notes (Signed)
[facility] [address] [place]. [place] [address] [phone]       STROKE FOLLOW UP NOTE  Howard White Date of Birth:  05-06-1961 Medical Record Number:  [id number]   Reason for Referral: stroke follow up    SUBJECTIVE:   CHIEF COMPLAINT:  Chief Complaint  Patient presents with  . Follow-up    TR with mom (pat) Pt is well, still has some balance disturbance, weakness on L side and unable to walk alone.     HPI:   Today, 03/10/2021, Mr White returns for 17-month stroke follow-up accompanied by his mother.  Stable since prior visit without new or worsening stroke/TIA symptoms.  Residual left spastic hemiparesis (arm>leg) and gait impairment with continued improvement currently working with PT/OT.  He continues on baclofen 5 mg 3 times daily with benefit.  He has been ambulating with a cane and recently ambulated about 400' without difficulty accompanied by his father.  He continues to maintain ADLs independently.  Reports compliance on aspirin and atorvastatin without associated side effects Blood pressure today 103/65 -does not routinely monitor at home  No further concerns at this time   History provided for reference purposes only Initial visit 12/08/2020 JM: Howard White is being seen for hospital follow-up accompanied by his mother.  He has been doing well since discharge.  Reports residual left-sided weakness and imbalance but has been gradually improving currently working with [facility] therapies.  Chronic history of lower back pain with left-sided sciatica worsened post stroke.  He has been using baclofen 5 mg 3 times daily with benefit although does admit to increased drowsiness after taking daytime dosage.  He will also use heating pad with benefit.  Denies new stroke/TIA symptoms.  Completed 3 weeks DAPT and remains on aspirin alone as well as atorvastatin 40 mg daily for secondary stroke prevention.  Blood pressure today 130/78.  No further concerns  at this time.  Stroke admission 09/26/2020 Howard White is a 60 y.o. male with history of astrocytoma with left frontal craniotomy resection 20 years ago, seizures, depression and hypothyroidism,  who presented to [facility] ED on 09/26/2020 with right arm and leg weakness.   Personally reviewed hospitalization pertinent progress notes, lab work and imaging with summary provided.  Evaluated by Dr. Erlinda Hong with stroke work-up revealing acute infarct right BG/CR likely due to small vessel disease.  Recommended DAPT for 3 weeks and aspirin alone.  LDL 131 and initiate atorvastatin 40 mg daily.  No history or evidence of HTN or DM.  Other stroke risk factors include EtOH use but no prior stroke history.  Other active problems include history of seizures on Lamictal and astrocytoma s/p craniotomy with radiation 20 years ago.  Residual left hemiparesis and was discharged to CIR for ongoing therapy needs per therapy recommendations.  Stroke: acute infarct right BG/CR likely due to small vessel disease.   CT Head - No acute intracranial abnormality or significant interval change. Stable atrophy and White matter disease. Left frontal craniotomy with resection cavity.   MRI head - 1.9 x 1.2 cm acute infarct extending from the right corona radiata into the right basal ganglia. Prior left frontoparietal craniotomy with left frontal lobe resection cavity.   CTA H&N - Intracranial atherosclerosis without large vessel occlusion or flow limiting proximal stenosis. .   CT Perfusion - No evidence of acute ischemia on CTP.  2D Echo EF 60-65%  Sars Corona Virus 2  - negative  LDL - 131  HgbA1c -  5.4  VTE prophylaxis - Lovenox  No antithrombotic prior to admission, now on aspirin 81 mg daily and clopidogrel 75 mg daily. Continue DAPT for 3 weeks, and then ASA alone.  Patient counseled to be compliant with his antithrombotic medications  Ongoing aggressive stroke risk factor management  Therapy recommendations:   CIR recommended   Disposition:  CIR      ROS:   14 system review of systems performed and negative with exception of those listed in HPI  PMH:  Past Medical History:  Diagnosis Date  . Brain cancer ([place])   . Seizures ([place])   . Thyroid disease    Hypothyroid    PSH:  Past Surgical History:  Procedure Laterality Date  . brain cancer    . BRAIN SURGERY      Social History:  Social History   Socioeconomic History  . Marital status: Divorced    Spouse name: Not on file  . Number of children: 0  . Years of education: Masters  . Highest education level: Not on file  Occupational History  . Occupation: Disability  Tobacco Use  . Smoking status: Never Smoker  . Smokeless tobacco: Never Used  Substance and Sexual Activity  . Alcohol use: Yes    Alcohol/week: 0.0 standard drinks    Comment: 2 beers a month  . Drug use: No  . Sexual activity: Not on file  Other Topics Concern  . Not on file  Social History Narrative   Patient lives at home with his parents.   Patient is on long term disability.    Patient is divorced.    Patient has no children.    Patient has his Master's in Business.    Patient is right-handed.   Social Determinants of Health   Financial Resource Strain: Not on file  Food Insecurity: Not on file  Transportation Needs: Not on file  Physical Activity: Not on file  Stress: Not on file  Social Connections: Not on file  Intimate Partner Violence: Not on file    Family History:  Family History  Problem Relation Age of Onset  . Brain cancer Other   . Thyroid disease Other   . Cancer Other   . Diabetes Other   . Stroke Other     Medications:   Current Outpatient Medications on File Prior to Visit  Medication Sig Dispense Refill  . acetaminophen (TYLENOL) 325 MG tablet Take 2 tablets (650 mg total) by mouth every 6 (six) hours as needed for mild pain (or Fever >/= 101).    Marland Kitchen aspirin EC 81 MG EC tablet Take 1 tablet (81 mg total) by mouth  daily. Swallow whole. 30 tablet 11  . atorvastatin (LIPITOR) 40 MG tablet Take 40 mg by mouth daily.    . Baclofen 5 MG TABS Take 5 mg by mouth 3 (three) times daily. 90 tablet 2  . calcium carbonate (TUMS - DOSED IN MG ELEMENTAL CALCIUM) 500 MG chewable tablet Chew 1 tablet (200 mg of elemental calcium total) by mouth daily. 30 tablet 0  . docusate sodium (COLACE) 100 MG capsule Take 2 capsules (200 mg total) by mouth 2 (two) times daily. 10 capsule 0  . escitalopram (LEXAPRO) 10 MG tablet Take 1 tablet (10 mg total) by mouth daily. 30 tablet 0  . famotidine (PEPCID) 20 MG tablet Take 1 tablet (20 mg total) by mouth daily. 30 tablet 0  . ketotifen (ZADITOR) 0.025 % ophthalmic solution Place 1 drop into both eyes 2 (  two) times daily as needed (for itchy eyes). 5 mL 0  . lamoTRIgine (LAMICTAL) 150 MG tablet TAKE 1 AND 1/2 TABS EVERY MORNING AND 2 TAB EVERY EVENING (Patient taking differently: Take 225-300 mg by mouth See admin instructions. TAKE 1 AND 1/2 TABS ( 225mg )  EVERY MORNING AND 2 TAB (300mg ) EVERY EVENING) 315 tablet 3  . melatonin 3 MG TABS tablet Take 2 tablets (6 mg total) by mouth at bedtime. 30 tablet 0  . Multiple Vitamins-Minerals (MULTIVITAMIN WITH MINERALS) tablet Take 1 tablet by mouth daily.    . polyethylene glycol (MIRALAX / GLYCOLAX) 17 g packet Take 17 g by mouth daily. 14 each 0  . SYNTHROID 112 MCG tablet Take 1 tablet (112 mcg total) by mouth at bedtime. 30 tablet 0   No current facility-administered medications on file prior to visit.    Allergies:   Allergies  Allergen Reactions  . Lactose Intolerance (Gi)       OBJECTIVE:  Physical Exam  Vitals:   03/10/21 1402  BP: 103/65  Pulse: 62  Weight: 188 lb (85.3 kg)  Height: 5\' 10"  (1.778 m)   Body mass index is 26.98 kg/m. No exam data present  General: well developed, well nourished,  very pleasant middle-aged Caucasian male, seated, in no evident distress Head: head normocephalic and atraumatic.    Neck: supple with no carotid or supraclavicular bruits Cardiovascular: regular rate and rhythm, no murmurs Musculoskeletal: no deformity Skin:  no rash/petichiae Vascular:  Normal pulses all extremities   Neurologic Exam Mental Status: Awake and fully alert.  Fluent speech and language. Oriented to place and time. Recent and remote memory intact. Attention span, concentration and fund of knowledge appropriate. Mood and affect appropriate.  Cranial Nerves: Pupils equal, briskly reactive to light. Extraocular movements full without nystagmus. Visual fields full to confrontation. Hearing intact. Facial sensation intact.  Very slight left lower facial weakness with smiling.  Tongue and palate moves normally and symmetrically.  Motor: Normal bulk and tone. Normal strength in right upper and lower extremity.   LUE 4/5 with slightly decreased handgrip with increased tone LLE 4+-5/5 with increased tone Sensory.: intact to touch , pinprick , position and vibratory sensation.  Coordination: Rapid alternating movements normal in all extremities slightly decreased left hand. Finger-to-nose and heel-to-shin performed accurately on right side. Gait and Station: Deferred as cane not present during visit Reflexes: 2+LUE and LLE; 1+ RUE and RLE. Toes downgoing.        ASSESSMENT: Charvis Lightner is a 60 y.o. year old male with acute right BG/CR infarct likely secondary to small vessel disease on 09/26/2020 found after presenting with right arm and leg weakness.  Vascular risk factors include HLD, astrocytoma s/p craniotomy 20 years ago and seizures on Lamictal.      PLAN:  1. R BG/CR stroke :  a. Residual deficit: Left spastic hemiparesis and gait impairment with imbalance.  Continued improvement and encouraged continued participation with outpatient PT/OT for hopeful further recovery.  Advised use of cane at all times unless otherwise instructed.  Continued use of baclofen for spasticity.  b. Continue  aspirin 81 mg daily  and atorvastatin 40 mg daily for secondary stroke prevention.   c. Discussed secondary stroke prevention measures and importance of close PCP follow up for aggressive stroke risk factor management  2. HLD: LDL goal <70. Recent LDL 131 therefore initiated atorvastatin 40 mg daily.  3. Seizures: Stable without any recurrence on Lamictal per Dr. Krista Blue    Follow  up in 4 months or call earlier if needed   CC:  [facility] provider: Dr. Sedonia Small, MD    I spent 30 minutes of face-to-face and non-face-to-face time with patient and mother.  This included previsit chart review, lab review, study review, order entry, electronic health record documentation, patient education regarding stroke including etiology, residual deficits, importance of managing stroke risk factors and answered all other questions to patient and mother's satisfaction  Frann Rider, AGNP-BC  [facility] [address] [place], [place] [address]  Phone [phone] Fax [phone] Note: This document was prepared with digital dictation and possible smart phrase technology. Any transcriptional errors that result from this process are unintentional.

## 2021-03-10 NOTE — Patient Instructions (Signed)
Continue working with therapies for likely ongoing recovery - keep up the good wor!!  Continue aspirin 81 mg daily  and atorvastatin for secondary stroke prevention  Continue to follow up with PCP regarding cholesterolmanagement  Maintain strict control of cholesterol with LDL cholesterol (bad cholesterol) goal below 70 mg/dL.  Recommend routine monitoring of blood pressure at home as well as when you are feeling not as steady or lightheaded      Followup in the future with me in 4 months or call earlier if needed       Thank you for coming to see [place] at [facility]. I hope we have been able to provide you high quality care today.  You may receive a patient satisfaction survey over the next few weeks. We would appreciate your feedback and comments so that we may continue to improve ourselves and the health of our patients.

## 2021-03-10 NOTE — Therapy (Signed)
[facility] [address] [place], [place], [address] Phone: [phone]   Fax:  [phone]  Physical Therapy Treatment  Patient Details  Name: Howard White MRN: [id number] Date of Birth: Oct 25, 1961 Referring Provider (PT): Letta Pate Luanna Salk, MD   Encounter Date: 03/10/2021   PT End of Session - 03/10/21 1453    Visit Number 17    Number of Visits 29    Date for PT Re-Evaluation 04/20/21    Authorization Type HTA - 10th visit PN    Progress Note Due on Visit 20    PT Start Time 1447    PT Stop Time 1530    PT Time Calculation (min) 43 min    Equipment Utilized During Treatment Gait belt    Activity Tolerance Patient tolerated treatment well    Behavior During Therapy Impulsive;WFL for tasks assessed/performed           Past Medical History:  Diagnosis Date  . Brain cancer ([place])   . Seizures ([place])   . Thyroid disease    Hypothyroid    Past Surgical History:  Procedure Laterality Date  . brain cancer    . BRAIN SURGERY      There were no vitals filed for this visit.   Subjective Assessment - 03/10/21 1451    Subjective No new complaints. No pain. Did have a fall over a week ago- was in the wheelchair with dad pushing him, his feet went down and it flipped him out. Landed on carpet. Used the stairs to get up.    Patient is accompained by: Family member   mom   Pertinent History Epilepsy, malignant neoplasm of brain, astrocytoma brain tumor, thyroid disease, brain surgery    Limitations Standing;Walking    Patient Stated Goals Work on his balance and be able to walk by himself.    Pain Score 0-No pain                 [facility] Adult PT Treatment/Exercise - 03/10/21 1454      Transfers   Transfers Sit to Stand;Stand to Sit    Sit to Stand 5: Supervision    Sit to Stand Details Verbal cues for precautions/safety    Stand to Sit 5: Supervision    Stand to Sit Details (indicate cue type and  reason) Verbal cues for precautions/safety    Number of Reps 10 reps;1 set    Comments 10 reps of sit<>stands with hands clasped together in front of patient with emphasis on slow/controlled movements while staying in midline with head/trunk, min guard assist for balance.      Ambulation/Gait   Ambulation/Gait Yes    Ambulation/Gait Assistance 4: Min guard;4: Min assist    Ambulation/Gait Assistance Details cues to focus on gait and posture. min assist when pt distracted in busy gym or rushing, min guard when pt focuses and slows down.    Ambulation Distance (Feet) 80 Feet   x2   Assistive device Straight cane    Gait Pattern Step-through pattern;Decreased arm swing - left;Decreased step length - left;Decreased stance time - left    Ambulation Surface Level;Indoor      Neuro Re-ed    Neuro Re-ed Details  for balance/strengthening/NMR: seated on green air disc- with 2# weighted ball for UE raises, diagonal movements from knee<>opposite shoulder height (performed each direction) for ~10 reps each. Then working on alternating UE raises for trunk elongation (within ROM on left side), then upper trunk rotation with  reaching back behind him for ~10 reps each/each way. min guard assist for safety with cues throughout for posture, midline orientation and to slow down with ex's for imrpoved stability/balance. standing at counter top- in wide stance started with lateral weight shifting with same side reaching up to target on top cabinet, progressing to lateral weight shifting with same side reaching laterally to target on cabinet, progressing to lateral stepping out with reaching for target<>stepping back in. alternating sides with all the above with min assist balance, assist/facilitation for posture/pelvic position and cues for posture/ex form and to slow down with activity; standing next to mat table with airex in front- with no UE support in wide stance worked on alteranting foot taps to airex with min guard  to min assist with cues for weight shifting and posture. Progressed to forward stepping up onto airex/backward back to floor with right HHA, min/mod assist. cues to slow down, weight shifitng prior to stepping oppposite LE and for posture needed.                PT Short Term Goals - 02/19/21 1320      PT SHORT TERM GOAL #1   Title Pt will report compliance with updated HEP with parental supervision 5x a week    Time 4    Period Weeks    Status New    Target Date 03/21/21      PT SHORT TERM GOAL #2   Title Pt will perform all stand pivot transfers mod I    Time 4    Period Weeks    Status New    Target Date 03/21/21      PT SHORT TERM GOAL #3   Title Pt will improve BERG to >/= 46/56 to indicate decreased falls risk    Baseline 41/56    Time 4    Period Weeks    Status New    Target Date 03/21/21      PT SHORT TERM GOAL #4   Title Pt will demonstrate decreased falls risk when ambulating with cane as indicated by increase in DGI to >/= 11/24    Baseline 10/24    Time 4    Period Weeks    Status Revised    Target Date 03/21/21             PT Long Term Goals - 02/19/21 1324      PT LONG TERM GOAL #1   Title Pt will demonstrate ability to perform final HEP and walking program with supervision    Baseline Updated HEP and initiated walking at home with supervision    Time 8    Period Weeks    Status On-going    Target Date 04/20/21      PT LONG TERM GOAL #2   Title Pt will report improvement in LE function on FOTO to >/= 66%    Baseline 54%    Time 8    Period Weeks    Status On-going    Target Date 04/20/21      PT LONG TERM GOAL #3   Title Pt will be able to ambulate in and out of clinic with [place] and supervision    Time 8    Period Weeks    Status New    Target Date 04/20/21      PT LONG TERM GOAL #4   Title Pt will increase BERG to >/= 51/56    Baseline 41/56    Time 8  Period Weeks    Status New    Target Date 04/20/21      PT LONG  TERM GOAL #5   Title Pt will increase DGI to >/= 14/24    Baseline 10/24    Time 8    Period Weeks    Status Revised    Target Date 04/20/21      PT LONG TERM GOAL #6   Title Pt will demonstrate ability to safely stand from the floor with supervision and use of UE    Time 8    Period Weeks    Status New    Target Date 04/20/21      PT LONG TERM GOAL #7   Title Pt will ambulate x 500' outside with LRAD and supervision and will negotiate 12 stairs with R rail (ascending) with supervision to be able to access upstairs bedroom    Time 8    Period Weeks    Status On-going    Target Date 04/20/21                 Plan - 03/10/21 1453    Clinical Impression Statement Today's skilled session continued to focus on gait, posture and balance with continued cues to slow down and for postural awareness needed with activities. Did progress balance activiites in standing to incorporate complaint surfaces. No issues noted or reported in session. The pt is progressing toward goals and should benefit from continued PT to progress toward unmet goals.    Personal Factors and Comorbidities Comorbidity 3+;Past/Current Experience;Social Background    Comorbidities Epilepsy, malignant neoplasm of brain, astrocytoma brain tumor, thyroid disease, brain surgery, lives with elderly parents    Examination-Activity Limitations [place];Locomotion Level;Stairs;Transfers;Bathing;Toileting    Examination-Participation Restrictions Community Activity;Meal Prep;Shop    Stability/Clinical Decision Making Evolving/Moderate complexity    Rehab Potential Good    PT Frequency 2x / week    PT Duration 8 weeks    PT Treatment/Interventions ADLs/Self Care Home Management;Aquatic Therapy;DME Instruction;Gait training;Cognitive remediation;Stair training;Patient/family education;Functional mobility training;Orthotic Fit/Training;Therapeutic activities;Therapeutic exercise;Balance training;Neuromuscular re-education;Passive  range of motion;Vestibular;Visual/perceptual remediation/compensation    PT Next Visit Plan Standing at counter and reaching to encourage trunk elongation - more functional movements.  Gait training/safety with cane; stair negotiation - work on weaning off of wheelchair.  resisted sit <> stand, NMR/WB focusing on attention to L, tall kneeling, balance when turning, reorientation to midline/weight shifting/head and trunk righting.    PT Home Exercise Plan Access Code: [email or web address]    Consulted and Agree with Plan of Care Patient;Family member/caregiver    Family Member Consulted parents           Patient will benefit from skilled therapeutic intervention in order to improve the following deficits and impairments:  Abnormal gait,Decreased balance,Decreased cognition,Decreased coordination,Difficulty walking,Impaired tone,Impaired UE functional use,Impaired vision/preception,Postural dysfunction,Pain  Visit Diagnosis: Muscle weakness (generalized)  Unsteadiness on feet  Other symptoms and signs involving the nervous system  Hemiplegia and hemiparesis following other cerebrovascular disease affecting left non-dominant side (HCC)  Repeated falls  Other abnormalities of gait and mobility  Other lack of coordination     Problem List Patient Active Problem List   Diagnosis Date Noted  . Slow transit constipation   . Sleep disturbance   . Basal ganglia stroke ([place])   . Incontinence of feces   . Leukocytosis   . Seizures ([place])   . Spastic hemiparesis ([place])   . Cerebrovascular accident (CVA) of right basal ganglia ([place]) 09/30/2020  . CVA (cerebral vascular  accident) ([place]) 09/26/2020  . Gait disorder 10/12/2016  . Memory loss 10/12/2016  . Epilepsy ([place]) 03/30/2013  . Malignant neoplasm of brain ([place]) 03/30/2013  . Astrocytoma brain tumor [facility]) 03/27/2012    Willow Ora, PTA, [facility] [address], [place], [place] [address] [phone] 03/10/21,  4:01 PM   Name: Howard White MRN: [id number] Date of Birth: [birth year]

## 2021-03-12 NOTE — Progress Notes (Signed)
I agree with the above plan 

## 2021-03-13 ENCOUNTER — Ambulatory Visit: Payer: PPO | Admitting: Occupational Therapy

## 2021-03-13 ENCOUNTER — Ambulatory Visit: Payer: PPO | Admitting: Physical Therapy

## 2021-03-13 ENCOUNTER — Other Ambulatory Visit: Payer: Self-pay

## 2021-03-13 ENCOUNTER — Encounter: Payer: Self-pay | Admitting: Occupational Therapy

## 2021-03-13 DIAGNOSIS — M6281 Muscle weakness (generalized): Secondary | ICD-10-CM

## 2021-03-13 DIAGNOSIS — R29818 Other symptoms and signs involving the nervous system: Secondary | ICD-10-CM

## 2021-03-13 DIAGNOSIS — R2681 Unsteadiness on feet: Secondary | ICD-10-CM

## 2021-03-13 DIAGNOSIS — I69854 Hemiplegia and hemiparesis following other cerebrovascular disease affecting left non-dominant side: Secondary | ICD-10-CM | POA: Diagnosis not present

## 2021-03-13 DIAGNOSIS — R41840 Attention and concentration deficit: Secondary | ICD-10-CM

## 2021-03-13 DIAGNOSIS — R278 Other lack of coordination: Secondary | ICD-10-CM

## 2021-03-13 DIAGNOSIS — M25612 Stiffness of left shoulder, not elsewhere classified: Secondary | ICD-10-CM

## 2021-03-13 NOTE — Therapy (Signed)
[facility] [address] [place], [place], [address] Phone: [phone]   Fax:  [phone]  Occupational Therapy Treatment  Patient Details  Name: Howard White MRN: [id number] Date of Birth: 1961-08-17 Referring Provider (OT): Alysia Penna, MD   Encounter Date: 03/13/2021   OT End of Session - 03/13/21 1359    Visit Number 20    Number of Visits 25    Date for OT Re-Evaluation 03/23/21    Authorization Type [employer]    Authorization Time Period $15 copay for OT, VL:MN    OT Start Time 1358    OT Stop Time 1440    OT Time Calculation (min) 42 min    Activity Tolerance Patient tolerated treatment well    Behavior During Therapy [facility] for tasks assessed/performed           Past Medical History:  Diagnosis Date  . Brain cancer ([place])   . Seizures ([place])   . Thyroid disease    Hypothyroid    Past Surgical History:  Procedure Laterality Date  . brain cancer    . BRAIN SURGERY      There were no vitals filed for this visit.   Subjective Assessment - 03/13/21 1359    Subjective  Pt denies any pain.    Patient is accompanied by: Family member    Pertinent History Brain Tumor 2001. Hx of Seizures    Limitations Seizures. Fall Risk    Patient Stated Goals Walk again and use a cane. "foot, hand and balance"    Currently in Pain? No/denies             ADLs Pt reports toileting, bathing and dressing w mod I.  Large Pegs with LUE with max cues for upright sitting posture and decrease in compensations. Pt with significant forward neck flexion with sitting and completing task. Pt encouraged to sit up with upright siting posture with foam roll along spine while seated in wheelchair. Complete with 1 inch blocks with LUE with placing on elevated surface approx 18". Pt with mod cues for decreasing compensations and positioning.  Flipping Cards flipping cards with LUE with max  difficulty                    OT Short Term Goals - 03/13/21 1401      OT SHORT TERM GOAL #1   Title Pt will be independent with initial HEP 01/26/2021    Time 4    Period Weeks    Status Achieved   coordination HEP   Target Date 01/26/21      OT SHORT TERM GOAL #2   Title Pt will improve wrist extension in LUE to 35 degrees for improving functional use of LUE.    Baseline LUE 30 passive - unable to maintain position actively.    Time 4    Period Weeks    Status Achieved      OT SHORT TERM GOAL #3   Title Pt will improve attention, balance and range of motion in order to complete toilet hygiene with no physical assistance.    Time 4    Period Weeks    Status Achieved   pt got a bidet and is completing all toileting including hygiene with no assistance.     OT SHORT TERM GOAL #4   Title Pt will perform home management and/or simple meal prep with supervision and good safety awareness    Time 4  Period Weeks    Status Deferred   pt living with parents and not a goal at this time.     OT SHORT TERM GOAL #5   Title Pt will perform all basic ADLs without physical assistance.    Time 4    Period Weeks    Status Achieved   pt reports not needing any physical assistance for bathing, dressing or toileting.     OT SHORT TERM GOAL #6   Title Pt will improve functional use of LUE by scoring 25 or greater on Box and Blocks with LUE.    Baseline RUE 41, LUE 18    Time 4    Period Weeks    Status On-going   LUE 20 blocks 02/17/21            OT Long Term Goals - 03/09/21 1718      OT LONG TERM GOAL #1   Title Pt will be independent with updated HEP    Time 12    Period Weeks    Status On-going      OT LONG TERM GOAL #2   Title Pt will complete environmental scanning with 95% accuracy.    Time 12    Period Weeks    Status On-going      OT LONG TERM GOAL #3   Title Pt will attend to a novel cognitive task for 15 minutes or greater in a moderately  distracting environment with no cues for redirection.    Time 12    Period Weeks    Status On-going      OT LONG TERM GOAL #4   Title Pt will achieve wrist extension of 45 degrees or greater in LUE    Baseline 30*    Time 12    Period Weeks    Status Achieved      OT LONG TERM GOAL #5   Title Pt will improve grip strength in LUE to 30 lbs or greater for increase in ability to complete clothing management and other functional tasks with LUE.    Baseline LUE 18 RUE 41    Time 12    Period Weeks    Status On-going      OT LONG TERM GOAL #6   Title Pt will improve fine motor coordination in LUE by completing 9 hole peg test in 2 minutes or less    Baseline 2 pegs in 2 minutes    Time 12    Period Weeks    Status On-going      OT LONG TERM GOAL #7   Title Pt will complete FOTO And score 66% or greater.    Baseline 54%    Time 12    Period Weeks    Status On-going                 Plan - 03/13/21 1446    Clinical Impression Statement Pt continues to improve with cueing to slow down for movement patterns.    OT Occupational Profile and History Detailed Assessment- Review of Records and additional review of physical, cognitive, psychosocial history related to current functional performance    Occupational performance deficits (Please refer to evaluation for details): ADL's    Body Structure / Function / Physical Skills ADL;ROM;UE functional use;FMC;Decreased knowledge of use of DME;Balance;Vision;Dexterity;GMC;Coordination;IADL;Proprioception;Tone;Strength;Pain;Endurance    Cognitive Skills Attention;Safety Awareness;Sequencing;Orientation;Memory;Understand;Problem Solve    Rehab Potential Good    Clinical Decision Making Several treatment options, min-mod task modification necessary  Comorbidities Affecting Occupational Performance: May have comorbidities impacting occupational performance    Modification or Assistance to Complete Evaluation  No modification of tasks or  assist necessary to complete eval    OT Frequency 2x / week    OT Duration 12 weeks   24 visits + eval   OT Treatment/Interventions Self-care/ADL training;Therapeutic exercise;Visual/perceptual remediation/compensation;Patient/family education;Splinting;Neuromuscular education;Moist Heat;Aquatic Therapy;DME and/or AE instruction;Passive range of motion;Therapist, nutritional;Therapeutic activities;Energy conservation;Electrical Stimulation;Manual Therapy    Plan Review initial HEP and add/update prn, aquatics for postural realignment and activation, balance, gross motor coordiantion, and LUE fuctional ROM, prone    Recommended Other Services consider ST?    Consulted and Agree with Plan of Care Patient;Family member/caregiver   mom and dad   Family Member Consulted Dad           Patient will benefit from skilled therapeutic intervention in order to improve the following deficits and impairments:   Body Structure / Function / Physical Skills: ADL,ROM,UE functional use,FMC,Decreased knowledge of use of DME,Balance,Vision,Dexterity,GMC,Coordination,IADL,Proprioception,Tone,Strength,Pain,Endurance Cognitive Skills: Attention,Safety Awareness,Sequencing,Orientation,Memory,Understand,Problem Solve     Visit Diagnosis: Muscle weakness (generalized)  Unsteadiness on feet  Other symptoms and signs involving the nervous system  Hemiplegia and hemiparesis following other cerebrovascular disease affecting left non-dominant side (HCC)  Other lack of coordination  Stiffness of left shoulder, not elsewhere classified  Attention and concentration deficit    Problem List Patient Active Problem List   Diagnosis Date Noted  . Slow transit constipation   . Sleep disturbance   . Basal ganglia stroke ([place])   . Incontinence of feces   . Leukocytosis   . Seizures ([place])   . Spastic hemiparesis ([place])   . Cerebrovascular accident (CVA) of right basal ganglia ([place]) 09/30/2020  . CVA  (cerebral vascular accident) ([place]) 09/26/2020  . Gait disorder 10/12/2016  . Memory loss 10/12/2016  . Epilepsy ([place]) 03/30/2013  . Malignant neoplasm of brain ([place]) 03/30/2013  . Astrocytoma brain tumor [facility]) 03/27/2012    Zachery Conch MOT, OTR/L  03/13/2021, 2:46 PM  [facility] [address] [place], [place], [address] Phone: [phone]   Fax:  [phone]  Name: Howard White MRN: [id number] Date of Birth: 1961/10/05

## 2021-03-16 ENCOUNTER — Encounter: Payer: Self-pay | Admitting: Occupational Therapy

## 2021-03-16 ENCOUNTER — Ambulatory Visit: Payer: PPO | Attending: Physical Medicine & Rehabilitation | Admitting: Occupational Therapy

## 2021-03-16 DIAGNOSIS — R29818 Other symptoms and signs involving the nervous system: Secondary | ICD-10-CM | POA: Diagnosis not present

## 2021-03-16 DIAGNOSIS — R41842 Visuospatial deficit: Secondary | ICD-10-CM | POA: Insufficient documentation

## 2021-03-16 DIAGNOSIS — R41840 Attention and concentration deficit: Secondary | ICD-10-CM | POA: Insufficient documentation

## 2021-03-16 DIAGNOSIS — M6281 Muscle weakness (generalized): Secondary | ICD-10-CM | POA: Diagnosis not present

## 2021-03-16 DIAGNOSIS — R296 Repeated falls: Secondary | ICD-10-CM | POA: Insufficient documentation

## 2021-03-16 DIAGNOSIS — R278 Other lack of coordination: Secondary | ICD-10-CM | POA: Insufficient documentation

## 2021-03-16 DIAGNOSIS — R2689 Other abnormalities of gait and mobility: Secondary | ICD-10-CM | POA: Insufficient documentation

## 2021-03-16 DIAGNOSIS — M25612 Stiffness of left shoulder, not elsewhere classified: Secondary | ICD-10-CM | POA: Diagnosis not present

## 2021-03-16 DIAGNOSIS — R2681 Unsteadiness on feet: Secondary | ICD-10-CM | POA: Insufficient documentation

## 2021-03-16 DIAGNOSIS — I69854 Hemiplegia and hemiparesis following other cerebrovascular disease affecting left non-dominant side: Secondary | ICD-10-CM | POA: Insufficient documentation

## 2021-03-16 NOTE — Therapy (Signed)
[place] [facility] [address] [place], [place], [address] Phone: [phone]   Fax:  [phone]  Occupational Therapy Treatment  Patient Details  Name: Howard White MRN: [id number] Date of Birth: 1961/04/07 Referring Provider (OT): Alysia Penna, MD   Encounter Date: 03/16/2021   OT End of Session - 03/16/21 1846    Visit Number 21    Number of Visits 25    Date for OT Re-Evaluation 03/23/21    Authorization Type [employer]    Authorization Time Period $15 copay for OT, VL:MN    OT Start Time 1315    OT Stop Time 1415    OT Time Calculation (min) 60 min    Activity Tolerance Patient tolerated treatment well    Behavior During Therapy [facility] for tasks assessed/performed           Past Medical History:  Diagnosis Date  . Brain cancer ([place])   . Seizures ([place])   . Thyroid disease    Hypothyroid    Past Surgical History:  Procedure Laterality Date  . brain cancer    . BRAIN SURGERY      There were no vitals filed for this visit.   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    Patient is accompanied by: Family member    Pertinent History Brain Tumor 2001. Hx of Seizures    Limitations Seizures. Fall Risk    Patient Stated Goals Walk again and use a cane. "foot, hand and balance"    Currently in Pain? No/denies    Pain Score 0-No pain           Patient seen for aquatic therapy visit.  Patient entered and exited the pool via stairs and min assist to utilize a step through pattern.  Worked on coordination of upper and lower limbs during transitional movements.  Patient had best response with repetition.  To increase demand created turbulence to further challenge balance.  Patient able to now safely move through 3.5-4 ft of water.  Attempted running with alternating arm and leg movement - patient was an avid runner.   Needed physical facilitation and frequent verbal cues to address postural set for walking and running.  Improved forward reach with LUE - shoulder flex/ elbow extend.                         OT Short Term Goals - 03/16/21 1847      OT SHORT TERM GOAL #1   Title Pt will be independent with initial HEP 01/26/2021    Time 4    Period Weeks    Status Achieved   coordination HEP   Target Date 01/26/21      OT SHORT TERM GOAL #2   Title Pt will improve wrist extension in LUE to 35 degrees for improving functional use of LUE.    Baseline LUE 30 passive - unable to maintain position actively.    Time 4    Period Weeks    Status Achieved      OT SHORT TERM GOAL #3   Title Pt will improve attention, balance and range of motion in order to complete toilet hygiene with no physical assistance.    Time 4    Period Weeks    Status Achieved   pt got a bidet and is completing all toileting including hygiene with  no assistance.     OT SHORT TERM GOAL #4   Title Pt will perform home management and/or simple meal prep with supervision and good safety awareness    Time 4    Period Weeks    Status Deferred   pt living with parents and not a goal at this time.     OT SHORT TERM GOAL #5   Title Pt will perform all basic ADLs without physical assistance.    Time 4    Period Weeks    Status Achieved   pt reports not needing any physical assistance for bathing, dressing or toileting.     OT SHORT TERM GOAL #6   Title Pt will improve functional use of LUE by scoring 25 or greater on Box and Blocks with LUE.    Baseline RUE 41, LUE 18    Time 4    Period Weeks    Status On-going   LUE 20 blocks 02/17/21            OT Long Term Goals - 03/16/21 1848      OT LONG TERM GOAL #1   Title Pt will be independent with updated HEP    Time 12    Period Weeks    Status On-going      OT LONG TERM GOAL #2   Title Pt will complete environmental scanning with 95% accuracy.    Time 12     Period Weeks    Status On-going      OT LONG TERM GOAL #3   Title Pt will attend to a novel cognitive task for 15 minutes or greater in a moderately distracting environment with no cues for redirection.    Time 12    Period Weeks    Status On-going      OT LONG TERM GOAL #4   Title Pt will achieve wrist extension of 45 degrees or greater in LUE    Baseline 30*    Time 12    Period Weeks    Status Achieved      OT LONG TERM GOAL #5   Title Pt will improve grip strength in LUE to 30 lbs or greater for increase in ability to complete clothing management and other functional tasks with LUE.    Baseline LUE 18 RUE 41    Time 12    Period Weeks    Status On-going      OT LONG TERM GOAL #6   Title Pt will improve fine motor coordination in LUE by completing 9 hole peg test in 2 minutes or less    Baseline 2 pegs in 2 minutes    Time 12    Period Weeks    Status On-going      OT LONG TERM GOAL #7   Title Pt will complete FOTO And score 66% or greater.    Baseline 54%    Time 12    Period Weeks    Status On-going                 Plan - 03/16/21 1846    Clinical Impression Statement Pt is showing emerging ability to stop movements that are unsuccessful or unsafe in aquatic therapy sessions.    OT Occupational Profile and History Detailed Assessment- Review of Records and additional review of physical, cognitive, psychosocial history related to current functional performance    Occupational performance deficits (Please refer to evaluation for details): ADL's    Body Structure /  Function / Physical Skills ADL;ROM;UE functional use;FMC;Decreased knowledge of use of DME;Balance;Vision;Dexterity;GMC;Coordination;IADL;Proprioception;Tone;Strength;Pain;Endurance    Cognitive Skills Attention;Safety Awareness;Sequencing;Orientation;Memory;Understand;Problem Solve    Rehab Potential Good    Clinical Decision Making Several treatment options, min-mod task modification necessary     Comorbidities Affecting Occupational Performance: May have comorbidities impacting occupational performance    Modification or Assistance to Complete Evaluation  No modification of tasks or assist necessary to complete eval    OT Frequency 2x / week    OT Duration 12 weeks   24 visits + eval   OT Treatment/Interventions Self-care/ADL training;Therapeutic exercise;Visual/perceptual remediation/compensation;Patient/family education;Splinting;Neuromuscular education;Moist Heat;Aquatic Therapy;DME and/or AE instruction;Passive range of motion;Therapist, nutritional;Therapeutic activities;Energy conservation;Electrical Stimulation;Manual Therapy    Plan Review initial HEP and add/update prn, aquatics for postural realignment and activation, balance, gross motor coordiantion, and LUE fuctional ROM, prone    Recommended Other Services consider ST?    Consulted and Agree with Plan of Care Patient;Family member/caregiver   mom and dad   Family Member Consulted Dad           Patient will benefit from skilled therapeutic intervention in order to improve the following deficits and impairments:   Body Structure / Function / Physical Skills: ADL,ROM,UE functional use,FMC,Decreased knowledge of use of DME,Balance,Vision,Dexterity,GMC,Coordination,IADL,Proprioception,Tone,Strength,Pain,Endurance Cognitive Skills: Attention,Safety Awareness,Sequencing,Orientation,Memory,Understand,Problem Solve     Visit Diagnosis: Hemiplegia and hemiparesis following other cerebrovascular disease affecting left non-dominant side (HCC)  Unsteadiness on feet  Other lack of coordination  Visuospatial deficit  Attention and concentration deficit  Stiffness of left shoulder, not elsewhere classified  Muscle weakness (generalized)  Other symptoms and signs involving the nervous system    Problem List Patient Active Problem List   Diagnosis Date Noted  . Slow transit constipation   . Sleep disturbance    . Basal ganglia stroke ([place])   . Incontinence of feces   . Leukocytosis   . Seizures ([place])   . Spastic hemiparesis ([place])   . Cerebrovascular accident (CVA) of right basal ganglia ([place]) 09/30/2020  . CVA (cerebral vascular accident) ([place]) 09/26/2020  . Gait disorder 10/12/2016  . Memory loss 10/12/2016  . Epilepsy ([place]) 03/30/2013  . Malignant neoplasm of brain ([place]) 03/30/2013  . 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

## 2021-03-17 ENCOUNTER — Encounter: Payer: Self-pay | Admitting: Physical Therapy

## 2021-03-17 ENCOUNTER — Ambulatory Visit: Payer: PPO | Admitting: Occupational Therapy

## 2021-03-17 ENCOUNTER — Other Ambulatory Visit: Payer: Self-pay

## 2021-03-17 ENCOUNTER — Encounter: Payer: Self-pay | Admitting: Occupational Therapy

## 2021-03-17 ENCOUNTER — Ambulatory Visit: Payer: PPO | Admitting: Physical Therapy

## 2021-03-17 DIAGNOSIS — I69854 Hemiplegia and hemiparesis following other cerebrovascular disease affecting left non-dominant side: Secondary | ICD-10-CM

## 2021-03-17 DIAGNOSIS — R278 Other lack of coordination: Secondary | ICD-10-CM

## 2021-03-17 DIAGNOSIS — R2681 Unsteadiness on feet: Secondary | ICD-10-CM

## 2021-03-17 DIAGNOSIS — R41840 Attention and concentration deficit: Secondary | ICD-10-CM

## 2021-03-17 DIAGNOSIS — R41842 Visuospatial deficit: Secondary | ICD-10-CM

## 2021-03-17 DIAGNOSIS — M6281 Muscle weakness (generalized): Secondary | ICD-10-CM

## 2021-03-17 DIAGNOSIS — R2689 Other abnormalities of gait and mobility: Secondary | ICD-10-CM

## 2021-03-17 DIAGNOSIS — M25612 Stiffness of left shoulder, not elsewhere classified: Secondary | ICD-10-CM

## 2021-03-17 NOTE — Therapy (Signed)
[facility] [address] [place], [place], [address] Phone: [phone]   Fax:  [phone]  Occupational Therapy Treatment  Patient Details  Name: Howard White MRN: [id number] Date of Birth: June 03, 1961 Referring Provider (OT): Alysia Penna, MD   Encounter Date: 03/17/2021   OT End of Session - 03/17/21 1536    Visit Number 22    Number of Visits 25    Date for OT Re-Evaluation 03/23/21    Authorization Type [employer]    Authorization Time Period $15 copay for OT, VL:MN    OT Start Time 1534    OT Stop Time 1615    OT Time Calculation (min) 41 min    Activity Tolerance Patient tolerated treatment well    Behavior During Therapy [facility] for tasks assessed/performed           Past Medical History:  Diagnosis Date  . Brain cancer ([place])   . Seizures ([place])   . Thyroid disease    Hypothyroid    Past Surgical History:  Procedure Laterality Date  . brain cancer    . BRAIN SURGERY      There were no vitals filed for this visit.   Subjective Assessment - 03/17/21 1537    Subjective  Pt denies any pain. Reports doing more with self-care with toileting and grooming.    Patient is accompanied by: Family member    Pertinent History Brain Tumor 2001. Hx of Seizures    Limitations Seizures. Fall Risk    Patient Stated Goals Walk again and use a cane. "foot, hand and balance"    Currently in Pain? No/denies             Ambulated/Functional Mobility to table for coordination tasks with min A and cues for attention to task at hand of ambulating. Pt veered to left and went to another location while trying to have a conversation with a PT.   Coordination with LUE - checker chips in cone, flipping jumbo cards, sorting jumbo cards. Pt with min difficulty with placing chips into cone and with flipping cards - Pt with decreased shoulder hiking this day with reaching. Pt had difficulty with attending to  sorting cards and recall of what the category was for sorting (number/letter)                  OT Education - 03/17/21 1811    Education Details --    Person(s) Educated --    Methods --    Comprehension --            OT Short Term Goals - 03/16/21 1847      OT SHORT TERM GOAL #1   Title Pt will be independent with initial HEP 01/26/2021    Time 4    Period Weeks    Status Achieved   coordination HEP   Target Date 01/26/21      OT SHORT TERM GOAL #2   Title Pt will improve wrist extension in LUE to 35 degrees for improving functional use of LUE.    Baseline LUE 30 passive - unable to maintain position actively.    Time 4    Period Weeks    Status Achieved      OT SHORT TERM GOAL #3   Title Pt will improve attention, balance and range of motion in order to complete toilet hygiene with no physical assistance.    Time 4    Period Weeks    Status  Achieved   pt got a bidet and is completing all toileting including hygiene with no assistance.     OT SHORT TERM GOAL #4   Title Pt will perform home management and/or simple meal prep with supervision and good safety awareness    Time 4    Period Weeks    Status Deferred   pt living with parents and not a goal at this time.     OT SHORT TERM GOAL #5   Title Pt will perform all basic ADLs without physical assistance.    Time 4    Period Weeks    Status Achieved   pt reports not needing any physical assistance for bathing, dressing or toileting.     OT SHORT TERM GOAL #6   Title Pt will improve functional use of LUE by scoring 25 or greater on Box and Blocks with LUE.    Baseline RUE 41, LUE 18    Time 4    Period Weeks    Status On-going   LUE 20 blocks 02/17/21            OT Long Term Goals - 03/17/21 1541      OT LONG TERM GOAL #1   Title Pt will be independent with updated HEP    Time 12    Period Weeks    Status On-going      OT LONG TERM GOAL #2   Title Pt will complete environmental scanning  with 95% accuracy.    Time 12    Period Weeks    Status On-going      OT LONG TERM GOAL #3   Title Pt will attend to a novel cognitive task for 15 minutes or greater in a moderately distracting environment with no cues for redirection.    Time 12    Period Weeks    Status On-going      OT LONG TERM GOAL #4   Title Pt will achieve wrist extension of 45 degrees or greater in LUE    Baseline 30*    Time 12    Period Weeks    Status Achieved      OT LONG TERM GOAL #5   Title Pt will improve grip strength in LUE to 30 lbs or greater for increase in ability to complete clothing management and other functional tasks with LUE.    Baseline LUE 18 RUE 41    Time 12    Period Weeks    Status On-going      OT LONG TERM GOAL #6   Title Pt will improve fine motor coordination in LUE by completing 9 hole peg test in 2 minutes or less    Baseline 2 pegs in 2 minutes    Time 12    Period Weeks    Status On-going      OT LONG TERM GOAL #7   Title Pt will complete FOTO And score 66% or greater.    Baseline 54%    Time 12    Period Weeks    Status On-going                 Plan - 03/17/21 1816    Clinical Impression Statement Pt with decreased shoulder hiking with low level reaching this day.    OT Occupational Profile and History Detailed Assessment- Review of Records and additional review of physical, cognitive, psychosocial history related to current functional performance    Occupational performance deficits (Please refer to evaluation  for details): ADL's    Body Structure / Function / Physical Skills ADL;ROM;UE functional use;FMC;Decreased knowledge of use of DME;Balance;Vision;Dexterity;GMC;Coordination;IADL;Proprioception;Tone;Strength;Pain;Endurance    Cognitive Skills Attention;Safety Awareness;Sequencing;Orientation;Memory;Understand;Problem Solve    Rehab Potential Good    Clinical Decision Making Several treatment options, min-mod task modification necessary     Comorbidities Affecting Occupational Performance: May have comorbidities impacting occupational performance    Modification or Assistance to Complete Evaluation  No modification of tasks or assist necessary to complete eval    OT Frequency 2x / week    OT Duration 12 weeks   24 visits + eval   OT Treatment/Interventions Self-care/ADL training;Therapeutic exercise;Visual/perceptual remediation/compensation;Patient/family education;Splinting;Neuromuscular education;Moist Heat;Aquatic Therapy;DME and/or AE instruction;Passive range of motion;Therapist, nutritional;Therapeutic activities;Energy conservation;Electrical Stimulation;Manual Therapy    Plan Review initial HEP and add/update prn, aquatics for postural realignment and activation, balance, gross motor coordiantion, and LUE fuctional ROM, prone    Recommended Other Services consider ST?    Consulted and Agree with Plan of Care Patient;Family member/caregiver   mom and dad   Family Member Consulted Dad           Patient will benefit from skilled therapeutic intervention in order to improve the following deficits and impairments:   Body Structure / Function / Physical Skills: ADL,ROM,UE functional use,FMC,Decreased knowledge of use of DME,Balance,Vision,Dexterity,GMC,Coordination,IADL,Proprioception,Tone,Strength,Pain,Endurance Cognitive Skills: Attention,Safety Awareness,Sequencing,Orientation,Memory,Understand,Problem Solve     Visit Diagnosis: Hemiplegia and hemiparesis following other cerebrovascular disease affecting left non-dominant side (HCC)  Unsteadiness on feet  Other lack of coordination  Visuospatial deficit  Attention and concentration deficit  Muscle weakness (generalized)  Stiffness of left shoulder, not elsewhere classified    Problem List Patient Active Problem List   Diagnosis Date Noted  . Slow transit constipation   . Sleep disturbance   . Basal ganglia stroke ([place])   . Incontinence of feces    . Leukocytosis   . Seizures ([place])   . Spastic hemiparesis ([place])   . Cerebrovascular accident (CVA) of right basal ganglia ([place]) 09/30/2020  . CVA (cerebral vascular accident) ([place]) 09/26/2020  . Gait disorder 10/12/2016  . Memory loss 10/12/2016  . Epilepsy ([place]) 03/30/2013  . Malignant neoplasm of brain ([place]) 03/30/2013  . Astrocytoma brain tumor [facility]) 03/27/2012    Zachery Conch MOT, OTR/L  03/17/2021, [facility] [address] [place], [place], [address] Phone: [phone]   Fax:  [phone]  Name: Quang Thorpe MRN: [id number] Date of Birth: [birth year]

## 2021-03-17 NOTE — Patient Instructions (Signed)
Access Code: [email or web address] URL: [email or web address] Date: 03/17/2021 Prepared by: Willow Ora  Exercises Sit to/from Stand in Stride position - 1 x daily - 5 x weekly - 1 sets - 10 reps Seated Alternating Side Stretch with Arm Overhead - 1 x daily - 7 x weekly - 5 reps Heel Toe Raises with Unilateral Counter Support - 1 x daily - 5 x weekly - 1 sets - 10 reps Wide Stance with Counter Support - 1 x daily - 5 x weekly - 1 sets - 10 reps Standing with Head Nod - 1 x daily - 5 x weekly - 1 sets - 10 reps Standing Balance with Eyes Closed - 1 x daily - 5 x weekly - 1 sets - 3 reps - 20 hold

## 2021-03-18 DIAGNOSIS — F331 Major depressive disorder, recurrent, moderate: Secondary | ICD-10-CM | POA: Diagnosis not present

## 2021-03-18 DIAGNOSIS — J40 Bronchitis, not specified as acute or chronic: Secondary | ICD-10-CM | POA: Diagnosis not present

## 2021-03-18 DIAGNOSIS — E039 Hypothyroidism, unspecified: Secondary | ICD-10-CM | POA: Diagnosis not present

## 2021-03-18 DIAGNOSIS — G4700 Insomnia, unspecified: Secondary | ICD-10-CM | POA: Diagnosis not present

## 2021-03-18 DIAGNOSIS — F329 Major depressive disorder, single episode, unspecified: Secondary | ICD-10-CM | POA: Diagnosis not present

## 2021-03-18 DIAGNOSIS — F322 Major depressive disorder, single episode, severe without psychotic features: Secondary | ICD-10-CM | POA: Diagnosis not present

## 2021-03-18 DIAGNOSIS — F3341 Major depressive disorder, recurrent, in partial remission: Secondary | ICD-10-CM | POA: Diagnosis not present

## 2021-03-18 NOTE — Therapy (Signed)
[facility] [address] [place], [place], [address] Phone: [phone]   Fax:  [phone]  Physical Therapy Treatment  Patient Details  Name: Howard White MRN: [id number] Date of Birth: 1960/12/24 Referring Provider (PT): Letta Pate Luanna Salk, MD   Encounter Date: 03/17/2021   PT End of Session - 03/17/21 1451    Visit Number 18    Number of Visits 29    Date for PT Re-Evaluation 04/20/21    Authorization Type HTA - 10th visit PN    Progress Note Due on Visit 20    PT Start Time 1448    PT Stop Time 1530    PT Time Calculation (min) 42 min    Equipment Utilized During Treatment Gait belt    Activity Tolerance Patient tolerated treatment well    Behavior During Therapy Impulsive;WFL for tasks assessed/performed           Past Medical History:  Diagnosis Date  . Brain cancer ([place])   . Seizures ([place])   . Thyroid disease    Hypothyroid    Past Surgical History:  Procedure Laterality Date  . brain cancer    . BRAIN SURGERY      There were no vitals filed for this visit.   Subjective Assessment - 03/17/21 1451    Subjective No new complaints. No falls or pain to report.    Patient is accompained by: Family member   mom   Pertinent History Epilepsy, malignant neoplasm of brain, astrocytoma brain tumor, thyroid disease, brain surgery    Limitations Standing;Walking    Patient Stated Goals Work on his balance and be able to walk by himself.    Currently in Pain? No/denies    Pain Score 0-No pain                 [facility] Adult PT Treatment/Exercise - 03/17/21 1453      Transfers   Transfers Sit to Stand;Stand to Sit    Sit to Stand 5: Supervision    Stand to Sit 5: Supervision    Stand Pivot Transfers 5: Supervision    Stand Pivot Transfer Details (indicate cue type and reason) wheelchair>mat table no AD, no cues or assistance needed.      Ambulation/Gait   Ambulation/Gait Yes     Ambulation/Gait Assistance 4: Min guard;4: Min assist    Ambulation/Gait Assistance Details cues to relax left arm for increased arm swing, for increased left step length/weight shifitng onto left LE in stance with gait. Pt also noted to have increased lateral sway when distracted with gait needing up to min assist for gait. at end of gait pt turning a full circle to sit, vs 90 degree to sit needing cues on sequening. will need practice on 90 degree turns to sit, vs 180 or 360 degree turns.    Ambulation Distance (Feet) 115 Feet   x1   Assistive device Straight cane    Gait Pattern Step-through pattern;Decreased arm swing - left;Decreased step length - left;Decreased stance time - left    Ambulation Surface Level;Indoor      Self-Care   Self-Care Other Self-Care Comments    Other Self-Care Comments  discussed HEP. advanced HEP today. Refer to Tobias for full details. Cues on ex form and technique with min guard assist for safety with balance ex's.           Issued to HEP this session: Access Code: [email or web address] URL: [email or web address] Date: 03/17/2021 Prepared by: Willow Ora  Exercises Sit to/from Stand in Stride position - 1 x daily - 5 x weekly - 1 sets - 10 reps - cues to slow down with performance.  Seated Alternating Side Stretch with Arm Overhead - 1 x daily - 7 x weekly - 5 reps - reviewed correct technique as pt reported increased arm pain with lifting left UE. Discussed to focus more on trunk leaning once UE raises to highest pain free position as ex was more for trunk stretching/elongation than shoulder ROM. Pt able to return demo after cues/discussion with less reported pain.   Advanced/new ex's issued to HEP this session Heel Toe Raises with Unilateral Counter Support - 1 x daily - 5 x weekly - 1 sets - 10 reps Wide Stance with Counter Support - 1 x daily - 5 x weekly - 1 sets - 10 reps Standing with Head Nod - 1 x daily - 5 x weekly - 1 sets - 10 reps Standing  Balance with Eyes Closed - 1 x daily - 5 x weekly - 1 sets - 3 reps - 20 hold     03/17/21 2156  PT Education  Education Details updated HEP  Person(s) Educated Patient;Parent(s)  Methods Explanation;Demonstration;Verbal cues;Handout  Comprehension Verbalized understanding;Verbal cues required;Need further instruction     PT Short Term Goals - 03/18/21 2157      PT SHORT TERM GOAL #1   Title Pt will report compliance with updated HEP with parental supervision 5x a week    Baseline 03/17/21: met with current HEP. HEP updated today.    Status Achieved    Target Date 03/21/21      PT SHORT TERM GOAL #2   Title Pt will perform all stand pivot transfers mod I    Baseline 03/17/21: at supervision level currently, improved from min/min guard assist    Status Partially Met    Target Date 03/21/21      PT SHORT TERM GOAL #3   Title Pt will improve BERG to >/= 46/56 to indicate decreased falls risk    Baseline 41/56    Time 4    Period Weeks    Status On-going    Target Date 03/21/21      PT SHORT TERM GOAL #4   Title Pt will demonstrate decreased falls risk when ambulating with cane as indicated by increase in DGI to >/= 11/24    Baseline 10/24    Time 4    Period Weeks    Status On-going    Target Date 03/21/21               PT Long Term Goals - 02/19/21 1324      PT LONG TERM GOAL #1   Title Pt will demonstrate ability to perform final HEP and walking program with supervision    Baseline Updated HEP and initiated walking at home with supervision    Time 8    Period Weeks    Status On-going    Target Date 04/20/21      PT LONG TERM GOAL #2   Title Pt will report improvement in LE function on FOTO to >/= 66%    Baseline 54%    Time 8    Period Weeks    Status On-going    Target Date 04/20/21      PT LONG TERM GOAL #3   Title Pt will be able to ambulate in and out of clinic with [place] and supervision    Time 8  Period Weeks    Status New    Target Date  04/20/21      PT LONG TERM GOAL #4   Title Pt will increase BERG to >/= 51/56    Baseline 41/56    Time 8    Period Weeks    Status New    Target Date 04/20/21      PT LONG TERM GOAL #5   Title Pt will increase DGI to >/= 14/24    Baseline 10/24    Time 8    Period Weeks    Status Revised    Target Date 04/20/21      PT LONG TERM GOAL #6   Title Pt will demonstrate ability to safely stand from the floor with supervision and use of UE    Time 8    Period Weeks    Status New    Target Date 04/20/21      PT LONG TERM GOAL #7   Title Pt will ambulate x 500' outside with LRAD and supervision and will negotiate 12 stairs with R rail (ascending) with supervision to be able to access upstairs bedroom    Time 8    Period Weeks    Status On-going    Target Date 04/20/21              03/17/21 1452  Plan  Clinical Impression Statement Today's skilled session continued to focus on gait with cane with up to min assist needed when distracted. Otherwise min guard assist for balance. Also began to address progress toward STGs with focus on today's session on HEP goal. Pt's HEP reviewed and progressed as able. The pt is making steady progress toward goals and should benefit from continued PT to progress toward unmet goals.  Personal Factors and Comorbidities Comorbidity 3+;Past/Current Experience;Social Background  Comorbidities Epilepsy, malignant neoplasm of brain, astrocytoma brain tumor, thyroid disease, brain surgery, lives with elderly parents  Examination-Activity Limitations [place];Locomotion Level;Stairs;Transfers;Bathing;Toileting  Examination-Participation Restrictions Community Activity;Meal Prep;Shop  Pt will benefit from skilled therapeutic intervention in order to improve on the following deficits Abnormal gait;Decreased balance;Decreased cognition;Decreased coordination;Difficulty walking;Impaired tone;Impaired UE functional use;Impaired vision/preception;Postural  dysfunction;Pain  Stability/Clinical Decision Making Evolving/Moderate complexity  Rehab Potential Good  PT Frequency 2x / week  PT Duration 8 weeks  PT Treatment/Interventions ADLs/Self Care Home Management;Aquatic Therapy;DME Instruction;Gait training;Cognitive remediation;Stair training;Patient/family education;Functional mobility training;Orthotic Fit/Training;Therapeutic activities;Therapeutic exercise;Balance training;Neuromuscular re-education;Passive range of motion;Vestibular;Visual/perceptual remediation/compensation  PT Next Visit Plan Check remaining STGs. Standing at counter and reaching to encourage trunk elongation - more functional movements.  Gait training/safety with cane; stair negotiation - work on weaning off of wheelchair.  resisted sit <> stand, NMR/WB focusing on attention to L, tall kneeling, balance when turning, reorientation to midline/weight shifting/head and trunk righting.  PT Home Exercise Plan Access Code: [email or web address]  Consulted and Agree with Plan of Care Patient;Family member/caregiver  Family Member Consulted parents         Patient will benefit from skilled therapeutic intervention in order to improve the following deficits and impairments:  Abnormal gait,Decreased balance,Decreased cognition,Decreased coordination,Difficulty walking,Impaired tone,Impaired UE functional use,Impaired vision/preception,Postural dysfunction,Pain  Visit Diagnosis: Hemiplegia and hemiparesis following other cerebrovascular disease affecting left non-dominant side ([place])  Unsteadiness on feet  Muscle weakness (generalized)  Other abnormalities of gait and mobility  Muscle weakness     Problem List Patient Active Problem List   Diagnosis Date Noted  . Slow transit constipation   . Sleep disturbance   . Basal ganglia stroke ([place])   .  Incontinence of feces   . Leukocytosis   . Seizures ([place])   . Spastic hemiparesis ([place])   . Cerebrovascular accident (CVA) of right  basal ganglia ([place]) 09/30/2020  . CVA (cerebral vascular accident) ([place]) 09/26/2020  . Gait disorder 10/12/2016  . Memory loss 10/12/2016  . Epilepsy ([place]) 03/30/2013  . Malignant neoplasm of brain ([place]) 03/30/2013  . Astrocytoma brain tumor [facility]) 03/27/2012    Willow Ora, PTA, [facility] [address] [place], [place] [address] [phone] 03/18/21, 4:27 PM   Name: Inioluwa Baris MRN: [id number] Date of Birth: [birth year]

## 2021-03-19 ENCOUNTER — Other Ambulatory Visit: Payer: Self-pay

## 2021-03-19 ENCOUNTER — Ambulatory Visit: Payer: PPO | Admitting: Physical Therapy

## 2021-03-19 ENCOUNTER — Encounter: Payer: Self-pay | Admitting: Physical Therapy

## 2021-03-19 VITALS — BP 130/80

## 2021-03-19 DIAGNOSIS — I69854 Hemiplegia and hemiparesis following other cerebrovascular disease affecting left non-dominant side: Secondary | ICD-10-CM

## 2021-03-19 DIAGNOSIS — R2689 Other abnormalities of gait and mobility: Secondary | ICD-10-CM

## 2021-03-19 DIAGNOSIS — M6281 Muscle weakness (generalized): Secondary | ICD-10-CM

## 2021-03-19 DIAGNOSIS — R2681 Unsteadiness on feet: Secondary | ICD-10-CM

## 2021-03-22 NOTE — Therapy (Signed)
[facility] [address] [place], [place], [address] Phone: [phone]   Fax:  [phone]  Physical Therapy Treatment  Patient Details  Name: Howard White MRN: [id number] Date of Birth: 1961-04-05 Referring Provider (PT): Charlett Blake, MD   Encounter Date: 03/19/2021    03/19/21 1237  PT Visits / Re-Eval  Visit Number 19  Number of Visits 29  Date for PT Re-Evaluation 04/20/21  Authorization  Authorization Type HTA - 10th visit PN  Progress Note Due on Visit 20  PT Time Calculation  PT Start Time 1234  PT Stop Time 1315  PT Time Calculation (min) 41 min  PT - End of Session  Equipment Utilized During Treatment Gait belt  Activity Tolerance Patient tolerated treatment well  Behavior During Therapy Impulsive;WFL for tasks assessed/performed     Past Medical History:  Diagnosis Date  . Brain cancer ([place])   . Seizures ([place])   . Thyroid disease    Hypothyroid    Past Surgical History:  Procedure Laterality Date  . brain cancer    . BRAIN SURGERY      Vitals:   03/19/21 1236  BP: 130/80     03/19/21 1236  Symptoms/Limitations  Subjective No new complaints. No falls or pain to report. Was sore after last session  Patient is accompained by: Family member (mom and dad)  Pertinent History Epilepsy, malignant neoplasm of brain, astrocytoma brain tumor, thyroid disease, brain surgery  Limitations Standing;Walking  Patient Stated Goals Work on his balance and be able to walk by himself.  Pain Assessment  Currently in Pain? No/denies  Pain Score 0      03/19/21 1237  Standardized Balance Assessment  Standardized Balance Assessment Berg Balance Test;Dynamic Gait Index  Berg Balance Test  Sit to Stand 4  Standing Unsupported 4  Sitting with Back Unsupported but Feet Supported on Floor or Stool 4  Stand to Sit 4  Transfers 4  Standing Unsupported with Eyes Closed 4  Standing Unsupported  with Feet Together 4  From Standing, Reach Forward with Outstretched Arm 3 (7 inches)  From Standing Position, Pick up Object from Floor 4  From Standing Position, Turn to Look Behind Over each Shoulder 3 (right>left)  Turn 360 Degrees 2  Standing Unsupported, Alternately Place Feet on Step/Stool 1  Standing Unsupported, One Foot in Front 3  Standing on One Leg 1  Total Score 45  Berg comment: 45/56- significant risk for falls  Dynamic Gait Index  Level Surface 2 (min guard assist)  Change in Gait Speed 2 (min guard assist)  Gait with Horizontal Head Turns 1  Gait with Vertical Head Turns 1  Gait and Pivot Turn 2  Step Over Obstacle 0 (min assist with cues on sequencing)  Step Around Obstacles 2 (min guard assist)  Steps 2  Total Score 12  DGI comment: 12/24 with Hurrycane      03/19/21 1237  Transfers  Transfers Sit to Stand;Stand to Sit  Sit to Stand 5: Supervision  Stand to Sit 5: Supervision  Ambulation/Gait  Ambulation/Gait Yes  Ambulation/Gait Assistance 4: Min guard;4: Min assist  Ambulation/Gait Assistance Details around gym with session with cues to relax left UE  Assistive device Straight cane  Gait Pattern Step-through pattern;Decreased arm swing - left;Decreased step length - left;Decreased stance time - left  Ambulation Surface Level;Indoor  Stairs Yes  Stairs Assistance 4: Min guard  Stairs Assistance Details (indicate cue type and reason) min guard to ascend  and descend. cues needed with descending to advance hands on rails for increased foward weight shifting due to posterior lean at times.  Stair Management Technique Two rails;Alternating pattern;Forwards  Number of Stairs 4 (x1 rep)  Height of Stairs 6      PT Short Term Goals - 03/19/21 2253      PT SHORT TERM GOAL #1   Title Pt will report compliance with updated HEP with parental supervision 5x a week    Baseline 03/17/21: met with current HEP. HEP updated today.    Status Achieved      PT  SHORT TERM GOAL #2   Title Pt will perform all stand pivot transfers mod I    Baseline 03/17/21: at supervision level currently, improved from min/min guard assist    Status Partially Met      PT SHORT TERM GOAL #3   Title Pt will improve BERG to >/= 46/56 to indicate decreased falls risk    Baseline 03/19/21: 45/56 scored today, improved just not to goal level    Time --    Period --    Status Partially Met    Target Date --      PT SHORT TERM GOAL #4   Title Pt will demonstrate decreased falls risk when ambulating with cane as indicated by increase in DGI to >/= 11/24    Baseline 03/19/21: 12/24 scored today, improved from 10/24 just not to goal level    Time --    Period --    Status Partially Met    Target Date --              PT Long Term Goals - 02/19/21 1324      PT LONG TERM GOAL #1   Title Pt will demonstrate ability to perform final HEP and walking program with supervision    Baseline Updated HEP and initiated walking at home with supervision    Time 8    Period Weeks    Status On-going    Target Date 04/20/21      PT LONG TERM GOAL #2   Title Pt will report improvement in LE function on FOTO to >/= 66%    Baseline 54%    Time 8    Period Weeks    Status On-going    Target Date 04/20/21      PT LONG TERM GOAL #3   Title Pt will be able to ambulate in and out of clinic with Hurrycane and supervision    Time 8    Period Weeks    Status New    Target Date 04/20/21      PT LONG TERM GOAL #4   Title Pt will increase BERG to >/= 51/56    Baseline 41/56    Time 8    Period Weeks    Status New    Target Date 04/20/21      PT LONG TERM GOAL #5   Title Pt will increase DGI to >/= 14/24    Baseline 10/24    Time 8    Period Weeks    Status Revised    Target Date 04/20/21      PT LONG TERM GOAL #6   Title Pt will demonstrate ability to safely stand from the floor with supervision and use of UE    Time 8    Period Weeks    Status New    Target Date  04/20/21      PT LONG  TERM GOAL #7   Title Pt will ambulate x 500' outside with LRAD and supervision and will negotiate 12 stairs with R rail (ascending) with supervision to be able to access upstairs bedroom    Time 8    Period Weeks    Status On-going    Target Date 04/20/21             03/19/21 1237  Plan  Clinical Impression Statement Today's skilled session focused on progress toward remaining STGs with DGI and Berg Balance Test goals partially met. Pt improved both scores, just not to goal level . The pt is progressing toward goals and should benefit from continued PT to progress toward unmet goals.  Personal Factors and Comorbidities Comorbidity 3+;Past/Current Experience;Social Background  Comorbidities Epilepsy, malignant neoplasm of brain, astrocytoma brain tumor, thyroid disease, brain surgery, lives with elderly parents  Examination-Activity Limitations [place];Locomotion Level;Stairs;Transfers;Bathing;Toileting  Examination-Participation Restrictions Community Activity;Meal Prep;Shop  Pt will benefit from skilled therapeutic intervention in order to improve on the following deficits Abnormal gait;Decreased balance;Decreased cognition;Decreased coordination;Difficulty walking;Impaired tone;Impaired UE functional use;Impaired vision/preception;Postural dysfunction;Pain  Stability/Clinical Decision Making Evolving/Moderate complexity  Rehab Potential Good  PT Frequency 2x / week  PT Duration 8 weeks  PT Treatment/Interventions ADLs/Self Care Home Management;Aquatic Therapy;DME Instruction;Gait training;Cognitive remediation;Stair training;Patient/family education;Functional mobility training;Orthotic Fit/Training;Therapeutic activities;Therapeutic exercise;Balance training;Neuromuscular re-education;Passive range of motion;Vestibular;Visual/perceptual remediation/compensation  PT Next Visit Plan 10th visit progress note due next session. Standing at counter and reaching to encourage  trunk elongation - more functional movements.  Gait training/safety with cane; stair negotiation - work on weaning off of wheelchair.  resisted sit <> stand, NMR/WB focusing on attention to L, tall kneeling, balance when turning, reorientation to midline/weight shifting/head and trunk righting.  PT Home Exercise Plan Access Code: [email or web address]  Consulted and Agree with Plan of Care Patient;Family member/caregiver  Family Member Consulted parents          Patient will benefit from skilled therapeutic intervention in order to improve the following deficits and impairments:  Abnormal gait,Decreased balance,Decreased cognition,Decreased coordination,Difficulty walking,Impaired tone,Impaired UE functional use,Impaired vision/preception,Postural dysfunction,Pain  Visit Diagnosis: Hemiplegia and hemiparesis following other cerebrovascular disease affecting left non-dominant side ([place])  Unsteadiness on feet  Muscle weakness (generalized)  Other abnormalities of gait and mobility     Problem List Patient Active Problem List   Diagnosis Date Noted  . Slow transit constipation   . Sleep disturbance   . Basal ganglia stroke ([place])   . Incontinence of feces   . Leukocytosis   . Seizures ([place])   . Spastic hemiparesis ([place])   . Cerebrovascular accident (CVA) of right basal ganglia ([place]) 09/30/2020  . CVA (cerebral vascular accident) ([place]) 09/26/2020  . Gait disorder 10/12/2016  . Memory loss 10/12/2016  . Epilepsy ([place]) 03/30/2013  . Malignant neoplasm of brain ([place]) 03/30/2013  . Astrocytoma brain tumor [facility]) 03/27/2012   Willow Ora, PTA, [facility] [address], [place], [place] [address] [phone] 03/22/21, 10:17 PM   Name: Kraig Genis MRN: [id number] Date of Birth: 09-May-1961

## 2021-03-22 NOTE — Progress Notes (Signed)
   03/19/21 1236  Symptoms/Limitations  Subjective No new complaints. No falls or pain to report. Was sore after last session  Patient is accompained by: Family member (mom and dad)  Pertinent History Epilepsy, malignant neoplasm of brain, astrocytoma brain tumor, thyroid disease, brain surgery  Limitations Standing;Walking  Patient Stated Goals Work on his balance and be able to walk by himself.  Pain Assessment  Currently in Pain? No/denies  Pain Score 0

## 2021-03-23 ENCOUNTER — Encounter: Payer: Self-pay | Admitting: Occupational Therapy

## 2021-03-23 ENCOUNTER — Ambulatory Visit: Payer: PPO | Admitting: Occupational Therapy

## 2021-03-23 DIAGNOSIS — I69854 Hemiplegia and hemiparesis following other cerebrovascular disease affecting left non-dominant side: Secondary | ICD-10-CM | POA: Diagnosis not present

## 2021-03-23 NOTE — Therapy (Signed)
[facility] [address] [place], [place], [address] Phone: [phone]   Fax:  [phone]  Occupational Therapy Treatment  Patient Details  Name: Howard White MRN: [id number] Date of Birth: [birth year] Referring Provider (OT): Alysia Penna, MD   Encounter Date: 03/23/2021   OT End of Session - 03/23/21 1436    Visit Number 23    Number of Visits 39    Date for OT Re-Evaluation 05/22/21    Authorization Type [employer]    Authorization Time Period $15 copay for OT, VL:MN    OT Start Time 1320    OT Stop Time 1415    OT Time Calculation (min) 55 min    Activity Tolerance Patient tolerated treatment well    Behavior During Therapy [facility] for tasks assessed/performed           Past Medical History:  Diagnosis Date  . Brain cancer ([place])   . Seizures ([place])   . Thyroid disease    Hypothyroid    Past Surgical History:  Procedure Laterality Date  . brain cancer    . BRAIN SURGERY      There were no vitals filed for this visit.   Subjective Assessment - 03/23/21 1426    Subjective  I am a little more wobbly today - mom reports BP was 118/80.    Patient is accompanied by: Family member    Pertinent History Brain Tumor 2001. Hx of Seizures    Limitations Seizures. Fall Risk    Patient Stated Goals Walk again and use a cane. "foot, hand and balance"    Currently in Pain? No/denies    Pain Score 0-No pain            Patient seen for aquatic therapy visit.  Patient entered and exited the pool with assistance (mod in and min out) with attempt at step through pattern and hands on hand rails.   Patient less stable this visit initially, but as session progressed so did balance both static and dynamic.  Worked on increase resistance to BUE for flexion and extension shoulder and elbow, as well as adb/adduction at shoulder in sitting and in standing against wall.  Worked on supported chin ups with overhead  support.  Working on gross motor coordination with walking suing alternate arm/leg for swing, working on timing, and balance.                      OT Education - 03/23/21 1427    Education Details planned turns, backing up    Person(s) Educated Patient    Methods Explanation    Comprehension Need further instruction            OT Short Term Goals - 03/23/21 1433      OT SHORT TERM GOAL #1   Title Pt will be independent with initial HEP 01/26/2021    Time 4    Period Weeks    Status Achieved   coordination HEP   Target Date 01/26/21      OT SHORT TERM GOAL #2   Title Pt will improve wrist extension in LUE to 35 degrees for improving functional use of LUE.    Baseline LUE 30 passive - unable to maintain position actively.    Time 4    Period Weeks    Status Achieved      OT SHORT TERM GOAL #3   Title Pt will improve attention, balance and range of motion  in order to complete toilet hygiene with no physical assistance.    Time 4    Period Weeks    Status Achieved   pt got a bidet and is completing all toileting including hygiene with no assistance.     OT SHORT TERM GOAL #4   Title Pt will perform home management and/or simple meal prep with supervision and good safety awareness    Time 4    Period Weeks    Status Deferred   pt living with parents and not a goal at this time.     OT SHORT TERM GOAL #5   Title Pt will perform all basic ADLs without physical assistance.    Time 4    Period Weeks    Status Achieved   pt reports not needing any physical assistance for bathing, dressing or toileting.     OT SHORT TERM GOAL #6   Title Pt will improve functional use of LUE by scoring 25 or greater on Box and Blocks with LUE.    Baseline RUE 41, LUE 18    Time 4    Period Weeks    Status On-going   LUE 20 blocks 02/17/21            OT Long Term Goals - 03/23/21 1435      OT LONG TERM GOAL #1   Title Pt will be independent with updated HEP    Time 12     Period Weeks    Status On-going      OT LONG TERM GOAL #2   Title Pt will complete environmental scanning with 95% accuracy.    Time 12    Period Weeks    Status On-going      OT LONG TERM GOAL #3   Title Pt will attend to a novel cognitive task for 15 minutes or greater in a moderately distracting environment with no cues for redirection.    Time 12    Period Weeks    Status On-going      OT LONG TERM GOAL #4   Title Pt will achieve wrist extension of 45 degrees or greater in LUE    Baseline 30*    Time 12    Period Weeks    Status Achieved      OT LONG TERM GOAL #5   Title Pt will improve grip strength in LUE to 30 lbs or greater for increase in ability to complete clothing management and other functional tasks with LUE.    Baseline LUE 18 RUE 41    Time 12    Period Weeks    Status On-going      OT LONG TERM GOAL #6   Title Pt will improve fine motor coordination in LUE by completing 9 hole peg test in 2 minutes or less    Baseline 2 pegs in 2 minutes    Time 12    Period Weeks    Status On-going      OT LONG TERM GOAL #7   Title Pt will complete FOTO And score 66% or greater.    Baseline 54%    Time 12    Period Weeks    Status On-going                 Plan - 03/23/21 1431    Clinical Impression Statement Pt with more instability at beginning of this session.    OT Occupational Profile and History Detailed Assessment- Review of Records and  additional review of physical, cognitive, psychosocial history related to current functional performance    Occupational performance deficits (Please refer to evaluation for details): ADL's    Body Structure / Function / Physical Skills ADL;ROM;UE functional use;FMC;Decreased knowledge of use of DME;Balance;Vision;Dexterity;GMC;Coordination;IADL;Proprioception;Tone;Strength;Pain;Endurance    Cognitive Skills Attention;Safety Awareness;Sequencing;Orientation;Memory;Understand;Problem Solve    Rehab Potential Good     Clinical Decision Making Several treatment options, min-mod task modification necessary    Comorbidities Affecting Occupational Performance: May have comorbidities impacting occupational performance    Modification or Assistance to Complete Evaluation  No modification of tasks or assist necessary to complete eval    OT Frequency 2x / week    OT Duration 12 weeks   24 visits + eval   OT Treatment/Interventions Self-care/ADL training;Therapeutic exercise;Visual/perceptual remediation/compensation;Patient/family education;Splinting;Neuromuscular education;Moist Heat;Aquatic Therapy;DME and/or AE instruction;Passive range of motion;Therapist, nutritional;Therapeutic activities;Energy conservation;Electrical Stimulation;Manual Therapy    Plan Review initial HEP and add/update prn, aquatics for postural realignment and activation, balance, gross motor coordiantion, and LUE fuctional ROM, prone    Recommended Other Services --    Consulted and Agree with Plan of Care Patient;Family member/caregiver   mom and dad   Family Member Consulted Dad           Patient will benefit from skilled therapeutic intervention in order to improve the following deficits and impairments:   Body Structure / Function / Physical Skills: ADL,ROM,UE functional use,FMC,Decreased knowledge of use of DME,Balance,Vision,Dexterity,GMC,Coordination,IADL,Proprioception,Tone,Strength,Pain,Endurance Cognitive Skills: Attention,Safety Awareness,Sequencing,Orientation,Memory,Understand,Problem Solve     Visit Diagnosis: Hemiplegia and hemiparesis following other cerebrovascular disease affecting left non-dominant side ([place]) - Plan: Ot plan of care cert/re-cert  Unsteadiness on feet - Plan: Ot plan of care cert/re-cert  Muscle weakness (generalized) - Plan: Ot plan of care cert/re-cert  Other lack of coordination - Plan: Ot plan of care cert/re-cert  Visuospatial deficit - Plan: Ot plan of care cert/re-cert  Attention and  concentration deficit - Plan: Ot plan of care cert/re-cert  Stiffness of left shoulder, not elsewhere classified - Plan: Ot plan of care cert/re-cert  Muscle weakness - Plan: Ot plan of care cert/re-cert  Other symptoms and signs involving the nervous system - Plan: Ot plan of care cert/re-cert    Problem List Patient Active Problem List   Diagnosis Date Noted  . Slow transit constipation   . Sleep disturbance   . Basal ganglia stroke ([place])   . Incontinence of feces   . Leukocytosis   . Seizures ([place])   . Spastic hemiparesis ([place])   . Cerebrovascular accident (CVA) of right basal ganglia ([place]) 09/30/2020  . CVA (cerebral vascular accident) ([place]) 09/26/2020  . Gait disorder 10/12/2016  . Memory loss 10/12/2016  . Epilepsy ([place]) 03/30/2013  . Malignant neoplasm of brain ([place]) 03/30/2013  . Astrocytoma brain tumor [facility]) 03/27/2012    Mariah Milling 03/23/2021, 2:39 PM  [facility] [address] [place], [place], [address] Phone: [phone]   Fax:  [phone]  Name: Howard White MRN: [id number] Date of Birth: [birth year]

## 2021-03-24 ENCOUNTER — Ambulatory Visit: Payer: PPO | Admitting: Physical Therapy

## 2021-03-24 ENCOUNTER — Ambulatory Visit: Payer: PPO | Admitting: Occupational Therapy

## 2021-03-24 ENCOUNTER — Encounter: Payer: Self-pay | Admitting: Occupational Therapy

## 2021-03-24 ENCOUNTER — Other Ambulatory Visit: Payer: Self-pay

## 2021-03-24 DIAGNOSIS — R2681 Unsteadiness on feet: Secondary | ICD-10-CM

## 2021-03-24 DIAGNOSIS — R29818 Other symptoms and signs involving the nervous system: Secondary | ICD-10-CM

## 2021-03-24 DIAGNOSIS — R296 Repeated falls: Secondary | ICD-10-CM

## 2021-03-24 DIAGNOSIS — R278 Other lack of coordination: Secondary | ICD-10-CM

## 2021-03-24 DIAGNOSIS — R41842 Visuospatial deficit: Secondary | ICD-10-CM

## 2021-03-24 DIAGNOSIS — I69854 Hemiplegia and hemiparesis following other cerebrovascular disease affecting left non-dominant side: Secondary | ICD-10-CM | POA: Diagnosis not present

## 2021-03-24 DIAGNOSIS — R2689 Other abnormalities of gait and mobility: Secondary | ICD-10-CM

## 2021-03-24 DIAGNOSIS — M25612 Stiffness of left shoulder, not elsewhere classified: Secondary | ICD-10-CM

## 2021-03-24 DIAGNOSIS — M6281 Muscle weakness (generalized): Secondary | ICD-10-CM

## 2021-03-24 DIAGNOSIS — R41840 Attention and concentration deficit: Secondary | ICD-10-CM

## 2021-03-24 NOTE — Therapy (Signed)
[facility] [address] [place], [place], [address] Phone: [phone]   Fax:  [phone]  Occupational Therapy Treatment  Patient Details  Name: Jemel Ono MRN: [id number] Date of Birth: [birth year] Referring Provider (OT): Alysia Penna, MD   Encounter Date: 03/24/2021   OT End of Session - 03/24/21 1539    Visit Number 24    Number of Visits 39    Date for OT Re-Evaluation 05/22/21    Authorization Type [employer]    Authorization Time Period $15 copay for OT, VL:MN    OT Start Time 1537    OT Stop Time 1615    OT Time Calculation (min) 38 min    Activity Tolerance Patient tolerated treatment well    Behavior During Therapy [facility] for tasks assessed/performed           Past Medical History:  Diagnosis Date  . Brain cancer ([place])   . Seizures ([place])   . Thyroid disease    Hypothyroid    Past Surgical History:  Procedure Laterality Date  . brain cancer    . BRAIN SURGERY      There were no vitals filed for this visit.   Subjective Assessment - 03/24/21 1539    Subjective  I got a blood pressure monitor. Pt reports some pain in his hip. What I need the most is coordination in my left hand.    Patient is accompanied by: Family member    Pertinent History Brain Tumor 2001. Hx of Seizures    Limitations Seizures. Fall Risk    Patient Stated Goals Walk again and use a cane. "foot, hand and balance"    Currently in Pain? No/denies            Postural Control with hemi glide, forward reaching x 5, working on weight shifiting to left as patient leans to right when standing and sitting.   Seated PVC pipe frame shoulder flexion and protraction and retraction with mod/max assistance with support d/t weakness and encouraging and facilitating upright seated posture and good movement patterns.  Box and Blocks see goal  Cylindrical Color Pegs with LUE for grasp/release and coordination. Low  level reaching with min difficulty.   Weight Bearing on elevated table with anterior/posterior weight shifting and lateral weight shifting. Pt worked on unilateral with lifting RUE while in incline position with weight bearing in LUE on table.                   OT Education - 03/24/21 1614    Education Details Cane Exercises in supine and self PROM    Person(s) Educated Patient    Methods Explanation;Demonstration;Handout    Comprehension Need further instruction;Returned demonstration;Verbalized understanding            OT Short Term Goals - 03/24/21 1552      OT SHORT TERM GOAL #1   Title Pt will be independent with initial HEP 01/26/2021    Time 4    Period Weeks    Status Achieved   coordination HEP   Target Date 01/26/21      OT SHORT TERM GOAL #2   Title Pt will improve wrist extension in LUE to 35 degrees for improving functional use of LUE.    Baseline LUE 30 passive - unable to maintain position actively.    Time 4    Period Weeks    Status Achieved      OT SHORT TERM GOAL #3  Title Pt will improve attention, balance and range of motion in order to complete toilet hygiene with no physical assistance.    Time 4    Period Weeks    Status Achieved   pt got a bidet and is completing all toileting including hygiene with no assistance.     OT SHORT TERM GOAL #4   Title Pt will perform home management and/or simple meal prep with supervision and good safety awareness    Time 4    Period Weeks    Status Deferred   pt living with parents and not a goal at this time.     OT SHORT TERM GOAL #5   Title Pt will perform all basic ADLs without physical assistance.    Time 4    Period Weeks    Status Achieved   pt reports not needing any physical assistance for bathing, dressing or toileting.     OT SHORT TERM GOAL #6   Title Pt will improve functional use of LUE by scoring 25 or greater on Box and Blocks with LUE.    Baseline RUE 41, LUE 18    Time 4     Period Weeks    Status On-going   21 - LUE 03/24/21            OT Long Term Goals - 03/23/21 1435      OT LONG TERM GOAL #1   Title Pt will be independent with updated HEP    Time 12    Period Weeks    Status On-going      OT LONG TERM GOAL #2   Title Pt will complete environmental scanning with 95% accuracy.    Time 12    Period Weeks    Status On-going      OT LONG TERM GOAL #3   Title Pt will attend to a novel cognitive task for 15 minutes or greater in a moderately distracting environment with no cues for redirection.    Time 12    Period Weeks    Status On-going      OT LONG TERM GOAL #4   Title Pt will achieve wrist extension of 45 degrees or greater in LUE    Baseline 30*    Time 12    Period Weeks    Status Achieved      OT LONG TERM GOAL #5   Title Pt will improve grip strength in LUE to 30 lbs or greater for increase in ability to complete clothing management and other functional tasks with LUE.    Baseline LUE 18 RUE 41    Time 12    Period Weeks    Status On-going      OT LONG TERM GOAL #6   Title Pt will improve fine motor coordination in LUE by completing 9 hole peg test in 2 minutes or less    Baseline 2 pegs in 2 minutes    Time 12    Period Weeks    Status On-going      OT LONG TERM GOAL #7   Title Pt will complete FOTO And score 66% or greater.    Baseline 54%    Time 12    Period Weeks    Status On-going                 Plan - 03/24/21 1643    Clinical Impression Statement Pt demonstrating improved coordination with LUE.    OT Occupational Profile and  History Detailed Assessment- Review of Records and additional review of physical, cognitive, psychosocial history related to current functional performance    Occupational performance deficits (Please refer to evaluation for details): ADL's    Body Structure / Function / Physical Skills ADL;ROM;UE functional use;FMC;Decreased knowledge of use of  DME;Balance;Vision;Dexterity;GMC;Coordination;IADL;Proprioception;Tone;Strength;Pain;Endurance    Cognitive Skills Attention;Safety Awareness;Sequencing;Orientation;Memory;Understand;Problem Solve    Rehab Potential Good    Clinical Decision Making Several treatment options, min-mod task modification necessary    Comorbidities Affecting Occupational Performance: May have comorbidities impacting occupational performance    Modification or Assistance to Complete Evaluation  No modification of tasks or assist necessary to complete eval    OT Frequency 2x / week    OT Duration 12 weeks   24 visits + eval   OT Treatment/Interventions Self-care/ADL training;Therapeutic exercise;Visual/perceptual remediation/compensation;Patient/family education;Splinting;Neuromuscular education;Moist Heat;Aquatic Therapy;DME and/or AE instruction;Passive range of motion;Therapist, nutritional;Therapeutic activities;Energy conservation;Electrical Stimulation;Manual Therapy    Plan Review initial HEP and add/update prn, aquatics for postural realignment and activation, balance, gross motor coordiantion, and LUE fuctional ROM, prone    Consulted and Agree with Plan of Care Patient;Family member/caregiver   mom and dad   Family Member Consulted Dad           Patient will benefit from skilled therapeutic intervention in order to improve the following deficits and impairments:   Body Structure / Function / Physical Skills: ADL,ROM,UE functional use,FMC,Decreased knowledge of use of DME,Balance,Vision,Dexterity,GMC,Coordination,IADL,Proprioception,Tone,Strength,Pain,Endurance Cognitive Skills: Attention,Safety Awareness,Sequencing,Orientation,Memory,Understand,Problem Solve     Visit Diagnosis: Hemiplegia and hemiparesis following other cerebrovascular disease affecting left non-dominant side (HCC)  Unsteadiness on feet  Muscle weakness (generalized)  Other lack of coordination  Visuospatial  deficit  Attention and concentration deficit  Stiffness of left shoulder, not elsewhere classified  Muscle weakness    Problem List Patient Active Problem List   Diagnosis Date Noted  . Slow transit constipation   . Sleep disturbance   . Basal ganglia stroke ([place])   . Incontinence of feces   . Leukocytosis   . Seizures ([place])   . Spastic hemiparesis ([place])   . Cerebrovascular accident (CVA) of right basal ganglia ([place]) 09/30/2020  . CVA (cerebral vascular accident) ([place]) 09/26/2020  . Gait disorder 10/12/2016  . Memory loss 10/12/2016  . Epilepsy ([place]) 03/30/2013  . Malignant neoplasm of brain ([place]) 03/30/2013  . Astrocytoma brain tumor [facility]) 03/27/2012    Zachery Conch MOT, OTR/L  03/24/2021, 4:44 PM  [facility] [address] [place], [place], [address] Phone: [phone]   Fax:  [phone]  Name: Fremon Zacharia MRN: [id number] Date of Birth: 1961-04-05

## 2021-03-24 NOTE — Patient Instructions (Signed)
   Self Passive Range of Motion   In sitting, clasp hands together and bend down towards toes for a stretch for your shoulder. Repeat 10 times and do 2-3 times a day.  In sitting, hold cane with both hands and slide across legs and down to toes for a stretch for your shoulder. Repeat 10 times and 2-3 times a day.   SELF ASSISTED WITH OBJECT:   Shoulder Flexion - Supine    Hold cane with both hands. Raise arms overhead, keep elbows straight. 10_ reps per set, 2-3 sets per day, _5_ days per week  Cane Horizontal - Supine    With straight arms holding cane above shoulders, bring cane out to right, center, out to left, and back to above head. Repeat 10_ times. Do 2-3_ times per day Copyright  VHI. All rights reserved.   Supine: Chest Press (Active)    Lie on back with arms fully extended. Lower bar or dowel slowly to chest and press to arm's length. Use cane Complete 3 sets of _10 repetitions.   Shoulder: Abduction (Supine)    With right arm flat on floor, hold dowel in palm. Slowly move arm up to side of head by pushing with opposite arm. Do not let elbow bend. Hold _10_ seconds. Repeat  10_ times. Do 3_ sessions per day. CAUTION: Stretch slowly and gently.  Copyright  VHI. All rights reserved.

## 2021-03-25 NOTE — Therapy (Signed)
[facility] [address] [name], [place], [address] Phone: [phone]   Fax:  [phone]  Physical Therapy Treatment and 10th Visit Progress Note  Patient Details  Name: Howard White MRN: [id number] Date of Birth: 06/16/1961 Referring Provider (PT): Letta Pate Luanna Salk, MD   Encounter Date: 03/24/2021   Progress Note Reporting Period 02/05/21 to 03/24/21  See note below for Objective Data and Assessment of Progress/Goals.      PT End of Session - 03/24/21 1454    Visit Number 20    Number of Visits 29    Date for PT Re-Evaluation 04/20/21    Authorization Type HTA - 10th visit PN    Progress Note Due on Visit 30    PT Start Time 1450    PT Stop Time 1530    PT Time Calculation (min) 40 min    Equipment Utilized During Treatment Gait belt    Activity Tolerance Patient tolerated treatment well    Behavior During Therapy Impulsive;WFL for tasks assessed/performed           Past Medical History:  Diagnosis Date  . Brain cancer ([place])   . Seizures ([place])   . Thyroid disease    Hypothyroid    Past Surgical History:  Procedure Laterality Date  . brain cancer    . BRAIN SURGERY      There were no vitals filed for this visit.   Subjective Assessment - 03/24/21 1455    Subjective Is working hard in the pool; has been running in the pool with OT.  Still walking with dad at home, pt reports he needs to go slower, especially with turning.  Bought a BP cuff to monitor BP when feeling weak.  Still needs to work on stability with transitions.    Patient is accompained by: Family member   mom and dad   Pertinent History Epilepsy, malignant neoplasm of brain, astrocytoma brain tumor, thyroid disease, brain surgery    Limitations Standing;Walking    Patient Stated Goals Work on his balance and be able to walk by himself.                             [facility] Adult PT Treatment/Exercise - 03/25/21  1816      Transfers   Transfers Sit to Stand;Stand to Sit;Stand Pivot Transfers    Sit to Stand 5: Supervision    Sit to Stand Details (indicate cue type and reason) cues to push to stand from arm rest of chair and not pull to stand on cane    Stand to Sit 5: Supervision    Stand Pivot Transfers 5: Supervision    Stand Pivot Transfer Details (indicate cue type and reason) with cane      Ambulation/Gait   Ambulation/Gait Yes    Ambulation/Gait Assistance 4: Min guard    Ambulation/Gait Assistance Details with Hurrycane; pt demonstrates more normal BOS and self-correction of head and trunk position.  Pt verbalizing stepping sequence with cane.  One episode of toe drag and LOB, min-mod A to recover    Assistive device Straight cane    Gait Pattern Step-through pattern;Decreased arm swing - left;Decreased stance time - left    Ambulation Surface Level;Indoor      High Level Balance   High Level Balance Activities Braiding    High Level Balance Comments to train crossing midline and pelvic rotation, weight shifting; in parallel bars to L  and R x 3 laps each beginning with UE support progressing to no UE support but with mod A and cues to keep feet under COG.               Balance Exercises - 03/25/21 1819      Balance Exercises: Standing   Stepping Strategy Lateral;10 reps;Limitations    Stepping Strategy Limitations laterally to L and R in parallel bars without UE support but with +2 for safety: focused on suprise perturbations to L and R with focus on taking one large step out to regain balance; when pulling to L, PT had to stabilize R foot to preven cross over.  Two episodes of full LOB with max A required to regain balance.    Sidestepping Upper extremity support;Other reps (comment);Theraband;Limitations    Theraband Level (Sidestepping) Level 4 (Blue)    Sidestepping Limitations Performed side stepping out and back to L and R against resistance of blue theraband with focus on  controlling weight shift away and then into resistance as well as controlled stepping. Performed x 10 reps to each side with therapist providing verbal, visual and tactile cues to sequence and to increase step length and weight shift.               PT Short Term Goals - 03/25/21 1809      PT SHORT TERM GOAL #1   Title Pt will report compliance with updated HEP with parental supervision 5x a week    Baseline 03/17/21: met with current HEP. HEP updated today.    Status Achieved      PT SHORT TERM GOAL #2   Title Pt will perform all stand pivot transfers mod I    Baseline 03/17/21: at supervision level currently, improved from min/min guard assist    Status Partially Met      PT SHORT TERM GOAL #3   Title Pt will improve BERG to >/= 46/56 to indicate decreased falls risk    Baseline 03/19/21: 45/56 scored today, improved just not to goal level    Status Partially Met      PT SHORT TERM GOAL #4   Title Pt will demonstrate decreased falls risk when ambulating with cane as indicated by increase in DGI to >/= 11/24    Baseline 03/19/21: 12/24 scored today, improved from 10/24 just not to goal level    Status Achieved             PT Long Term Goals - 02/19/21 1324      PT LONG TERM GOAL #1   Title Pt will demonstrate ability to perform final HEP and walking program with supervision    Baseline Updated HEP and initiated walking at home with supervision    Time 8    Period Weeks    Status On-going    Target Date 04/20/21      PT LONG TERM GOAL #2   Title Pt will report improvement in LE function on FOTO to >/= 66%    Baseline 54%    Time 8    Period Weeks    Status On-going    Target Date 04/20/21      PT LONG TERM GOAL #3   Title Pt will be able to ambulate in and out of clinic with [facility] and supervision    Time 8    Period Weeks    Status New    Target Date 04/20/21      PT LONG TERM GOAL #4  Title Pt will increase BERG to >/= 51/56    Baseline 41/56    Time 8     Period Weeks    Status New    Target Date 04/20/21      PT LONG TERM GOAL #5   Title Pt will increase DGI to >/= 14/24    Baseline 10/24    Time 8    Period Weeks    Status Revised    Target Date 04/20/21      PT LONG TERM GOAL #6   Title Pt will demonstrate ability to safely stand from the floor with supervision and use of UE    Time 8    Period Weeks    Status New    Target Date 04/20/21      PT LONG TERM GOAL #7   Title Pt will ambulate x 500' outside with LRAD and supervision and will negotiate 12 stairs with R rail (ascending) with supervision to be able to access upstairs bedroom    Time 8    Period Weeks    Status On-going    Target Date 04/20/21                 Plan - 03/25/21 1810    Clinical Impression Statement Pt is making good progress and has met 2/4 STG, partially meeting and making progress towards other two goals.  Pt is ambulating more consistently at home with cane with father's supervision-min A and continues to improve his safety awareness and motor planning with gait but continues to require assistance and close supervision for transfers.  Pt's gait demonstrates improvement in head and trunk righting, improved sequencing and more normal BOS, and improved coordination with turns.  Treatment session today focused on higher level gait and balance reaction training with use of resistance and perturbations.  Greater difficulty initiating step with LLE.  Will continue to address and progress towards LTG.    Personal Factors and Comorbidities Comorbidity 3+;Past/Current Experience;Social Background    Comorbidities Epilepsy, malignant neoplasm of brain, astrocytoma brain tumor, thyroid disease, brain surgery, lives with elderly parents    Examination-Activity Limitations [place];Locomotion Level;Stairs;Transfers;Bathing;Toileting    Examination-Participation Restrictions Community Activity;Meal Prep;Shop    Stability/Clinical Decision Making Evolving/Moderate  complexity    Rehab Potential Good    PT Frequency 2x / week    PT Duration 8 weeks    PT Treatment/Interventions ADLs/Self Care Home Management;Aquatic Therapy;DME Instruction;Gait training;Cognitive remediation;Stair training;Patient/family education;Functional mobility training;Orthotic Fit/Training;Therapeutic activities;Therapeutic exercise;Balance training;Neuromuscular re-education;Passive range of motion;Vestibular;Visual/perceptual remediation/compensation    PT Next Visit Plan Standing at counter and reaching to encourage trunk elongation - more functional movements.  Gait training/safety with cane; stair negotiation - work on weaning off of wheelchair.  resisted sit <> stand, NMR/WB focusing on attention to L, tall kneeling, balance when turning, reorientation to midline/weight shifting/head and trunk righting.  Work on turns.  Perturbations during balance/gait.    PT Home Exercise Plan Access Code: [email or web address]    Consulted and Agree with Plan of Care Patient;Family member/caregiver    Family Member Consulted parents           Patient will benefit from skilled therapeutic intervention in order to improve the following deficits and impairments:  Abnormal gait,Decreased balance,Decreased cognition,Decreased coordination,Difficulty walking,Impaired tone,Impaired UE functional use,Impaired vision/preception,Postural dysfunction,Pain  Visit Diagnosis: Repeated falls  Other abnormalities of gait and mobility  Unsteadiness on feet  Other symptoms and signs involving the nervous system  Other lack of coordination  Hemiplegia and hemiparesis following  other cerebrovascular disease affecting left non-dominant side [facility])     Problem List Patient Active Problem List   Diagnosis Date Noted  . Slow transit constipation   . Sleep disturbance   . Basal ganglia stroke ([place])   . Incontinence of feces   . Leukocytosis   . Seizures ([place])   . Spastic hemiparesis ([place])   . Cerebrovascular  accident (CVA) of right basal ganglia ([place]) 09/30/2020  . CVA (cerebral vascular accident) ([place]) 09/26/2020  . Gait disorder 10/12/2016  . Memory loss 10/12/2016  . Epilepsy ([place]) 03/30/2013  . Malignant neoplasm of brain ([place]) 03/30/2013  . Astrocytoma brain tumor ([place]) 03/27/2012    Rico Junker, PT, DPT 03/25/21    6:23 PM    [facility] [address] [place], [place], [address] Phone: [phone]   Fax:  [phone]  Name: Howard White MRN: [id number] Date of Birth: [birth year]

## 2021-03-26 ENCOUNTER — Ambulatory Visit: Payer: PPO | Admitting: Physical Therapy

## 2021-03-26 ENCOUNTER — Other Ambulatory Visit: Payer: Self-pay

## 2021-03-26 ENCOUNTER — Encounter: Payer: Self-pay | Admitting: Physical Therapy

## 2021-03-26 DIAGNOSIS — I69854 Hemiplegia and hemiparesis following other cerebrovascular disease affecting left non-dominant side: Secondary | ICD-10-CM

## 2021-03-26 DIAGNOSIS — M6281 Muscle weakness (generalized): Secondary | ICD-10-CM

## 2021-03-26 DIAGNOSIS — R2681 Unsteadiness on feet: Secondary | ICD-10-CM

## 2021-03-26 DIAGNOSIS — R2689 Other abnormalities of gait and mobility: Secondary | ICD-10-CM

## 2021-03-27 NOTE — Therapy (Signed)
[facility] [address] [place], [place], [address] Phone: [phone]   Fax:  [phone]  Physical Therapy Treatment  Patient Details  Name: Howard White MRN: [id number] Date of Birth: 16-Sep-1961 Referring Provider (PT): Letta Pate Luanna Salk, MD   Encounter Date: 03/26/2021   PT End of Session - 03/26/21 1322    Visit Number 21    Number of Visits 29    Date for PT Re-Evaluation 04/20/21    Authorization Type HTA - 10th visit PN    Progress Note Due on Visit 30    PT Start Time 1318    PT Stop Time 1400    PT Time Calculation (min) 42 min    Equipment Utilized During Treatment Gait belt    Activity Tolerance Patient tolerated treatment well    Behavior During Therapy Impulsive;WFL for tasks assessed/performed           Past Medical History:  Diagnosis Date  . Brain cancer ([place])   . Seizures ([place])   . Thyroid disease    Hypothyroid    Past Surgical History:  Procedure Laterality Date  . brain cancer    . BRAIN SURGERY      There were no vitals filed for this visit.   Subjective Assessment - 03/26/21 1321    Subjective No new complaints. Nof falls. Was sore in shoulders/arms after last session.    Patient is accompained by: Family member   mom and dad   Pertinent History Epilepsy, malignant neoplasm of brain, astrocytoma brain tumor, thyroid disease, brain surgery    Limitations Standing;Walking    Patient Stated Goals Work on his balance and be able to walk by himself.    Currently in Pain? No/denies    Pain Score 0-No pain                   [facility] Adult PT Treatment/Exercise - 03/26/21 1400      Transfers   Transfers Sit to Stand;Stand to Sit;Floor to Transfer;Squat Pivot Transfers    Sit to Stand 5: Supervision;With upper extremity assist;From bed;From chair/3-in-1    Stand to Sit 5: Supervision;With upper extremity assist;To bed;To chair/3-in-1    Squat Pivot Transfers 5:  Supervision    Squat Pivot Transfer Details (indicate cue type and reason) wheelchair<>mat table with UE support    Floor to Transfer 4: Min guard;4: Min assist;3: Mod assist    Floor to Transfer Details (indicate cue type and reason) min guard assist to get onto red mat on floor next to blue mat table with UE assist on mat table, cues on technique. min/mod assist to get back up from floor after tall kneeling activities with cues on technique.      Neuro Re-ed    Neuro Re-ed Details  for strengthening/muscle re-ed: in tall kneeling on red mat with UE support on mat table- alternating UE raises for 5-6 reps each side with assist for posture/balance. Then had pt work on mini squats for 2 sets of 10 reps with assist/faciliation for form/balance with light UE support. Rest between sets with pt spontaneously sititng on buttocks toward left side with uncontrolled descent and posterior balance loss needing max assist for PTA to stop pt from fully falling backwards onto the mat. Once seated pt rested in long sitting on red mat. Increased time/cues/assist needed to go from long sitting>quadruped>back to tall kneeling. after 2cd set of mini squats had pt turn to face PTA in tall kneeling with  rehab tech near pt for safety- with dowel rod provided sustained movments with cues for pt to hold balance/posture and not let PTA move him. Min guard to occasional min assist from rehab tech needed; in standing next to mat table with no UE support- provided sustained lateral pertubation with quick release working on iliciting a stepping reaction with rehab tech near pt for safety. Performed x 4-5 reps toward each side with up to max assist ot correct balance needed at times with decreased to no stepping strategy. Pt's quads noted to be tremoring in standing, therefore rest breaks taken as needed.                    PT Short Term Goals - 03/25/21 1809      PT SHORT TERM GOAL #1   Title Pt will report compliance  with updated HEP with parental supervision 5x a week    Baseline 03/17/21: met with current HEP. HEP updated today.    Status Achieved      PT SHORT TERM GOAL #2   Title Pt will perform all stand pivot transfers mod I    Baseline 03/17/21: at supervision level currently, improved from min/min guard assist    Status Partially Met      PT SHORT TERM GOAL #3   Title Pt will improve BERG to >/= 46/56 to indicate decreased falls risk    Baseline 03/19/21: 45/56 scored today, improved just not to goal level    Status Partially Met      PT SHORT TERM GOAL #4   Title Pt will demonstrate decreased falls risk when ambulating with cane as indicated by increase in DGI to >/= 11/24    Baseline 03/19/21: 12/24 scored today, improved from 10/24 just not to goal level    Status Achieved             PT Long Term Goals - 02/19/21 1324      PT LONG TERM GOAL #1   Title Pt will demonstrate ability to perform final HEP and walking program with supervision    Baseline Updated HEP and initiated walking at home with supervision    Time 8    Period Weeks    Status On-going    Target Date 04/20/21      PT LONG TERM GOAL #2   Title Pt will report improvement in LE function on FOTO to >/= 66%    Baseline 54%    Time 8    Period Weeks    Status On-going    Target Date 04/20/21      PT LONG TERM GOAL #3   Title Pt will be able to ambulate in and out of clinic with [place] and supervision    Time 8    Period Weeks    Status New    Target Date 04/20/21      PT LONG TERM GOAL #4   Title Pt will increase BERG to >/= 51/56    Baseline 41/56    Time 8    Period Weeks    Status New    Target Date 04/20/21      PT LONG TERM GOAL #5   Title Pt will increase DGI to >/= 14/24    Baseline 10/24    Time 8    Period Weeks    Status Revised    Target Date 04/20/21      PT LONG TERM GOAL #6   Title Pt will demonstrate  ability to safely stand from the floor with supervision and use of UE    Time 8     Period Weeks    Status New    Target Date 04/20/21      PT LONG TERM GOAL #7   Title Pt will ambulate x 500' outside with LRAD and supervision and will negotiate 12 stairs with R rail (ascending) with supervision to be able to access upstairs bedroom    Time 8    Period Weeks    Status On-going    Target Date 04/20/21                 Plan - 03/26/21 1322    Clinical Impression Statement Today's skilled session continued to focus on strengthening and balance. Pt with increased fatigue after tall kneeling activities and was significantly challenged by them. Pt would benefit from continued work in tall kneeling, adding a pillow under knees for increased comfort. The pt is making steady progress toward goals and should benefit from continued PT to progress toward unmet goals.    Personal Factors and Comorbidities Comorbidity 3+;Past/Current Experience;Social Background    Comorbidities Epilepsy, malignant neoplasm of brain, astrocytoma brain tumor, thyroid disease, brain surgery, lives with elderly parents    Examination-Activity Limitations [place];Locomotion Level;Stairs;Transfers;Bathing;Toileting    Examination-Participation Restrictions Community Activity;Meal Prep;Shop    Stability/Clinical Decision Making Evolving/Moderate complexity    Rehab Potential Good    PT Frequency 2x / week    PT Duration 8 weeks    PT Treatment/Interventions ADLs/Self Care Home Management;Aquatic Therapy;DME Instruction;Gait training;Cognitive remediation;Stair training;Patient/family education;Functional mobility training;Orthotic Fit/Training;Therapeutic activities;Therapeutic exercise;Balance training;Neuromuscular re-education;Passive range of motion;Vestibular;Visual/perceptual remediation/compensation    PT Next Visit Plan Standing at counter and reaching to encourage trunk elongation - more functional movements.  Gait training/safety with cane; stair negotiation - work on weaning off of wheelchair.   resisted sit <> stand, NMR/WB focusing on attention to L, tall kneeling, balance when turning, reorientation to midline/weight shifting/head and trunk righting.  Work on turns.  Perturbations during balance/gait.    PT Home Exercise Plan Access Code: [email or web address]    Consulted and Agree with Plan of Care Patient;Family member/caregiver    Family Member Consulted parents           Patient will benefit from skilled therapeutic intervention in order to improve the following deficits and impairments:  Abnormal gait,Decreased balance,Decreased cognition,Decreased coordination,Difficulty walking,Impaired tone,Impaired UE functional use,Impaired vision/preception,Postural dysfunction,Pain  Visit Diagnosis: Hemiplegia and hemiparesis following other cerebrovascular disease affecting left non-dominant side ([place])  Unsteadiness on feet  Muscle weakness (generalized)  Other abnormalities of gait and mobility     Problem List Patient Active Problem List   Diagnosis Date Noted  . Slow transit constipation   . Sleep disturbance   . Basal ganglia stroke ([place])   . Incontinence of feces   . Leukocytosis   . Seizures ([place])   . Spastic hemiparesis ([place])   . Cerebrovascular accident (CVA) of right basal ganglia ([place]) 09/30/2020  . CVA (cerebral vascular accident) ([place]) 09/26/2020  . Gait disorder 10/12/2016  . Memory loss 10/12/2016  . Epilepsy ([place]) 03/30/2013  . Malignant neoplasm of brain ([place]) 03/30/2013  . Astrocytoma brain tumor [facility]) 03/27/2012    Willow Ora, PTA, [facility] [address], [place], [place] [address] [phone] 03/27/21, 9:43 AM   Name: Howard White MRN: [id number] Date of Birth: 1961-05-29

## 2021-03-29 DIAGNOSIS — I6381 Other cerebral infarction due to occlusion or stenosis of small artery: Secondary | ICD-10-CM | POA: Diagnosis not present

## 2021-03-29 DIAGNOSIS — R159 Full incontinence of feces: Secondary | ICD-10-CM | POA: Diagnosis not present

## 2021-03-30 ENCOUNTER — Encounter: Payer: Self-pay | Admitting: Occupational Therapy

## 2021-03-30 ENCOUNTER — Ambulatory Visit: Payer: PPO | Admitting: Occupational Therapy

## 2021-03-30 DIAGNOSIS — I69854 Hemiplegia and hemiparesis following other cerebrovascular disease affecting left non-dominant side: Secondary | ICD-10-CM | POA: Diagnosis not present

## 2021-03-30 NOTE — Therapy (Signed)
[facility] [address] [place], [place], [address] Phone: [phone]   Fax:  [phone]  Occupational Therapy Treatment  Patient Details  Name: Howard White MRN: [id number] Date of Birth: 18-Sep-1961 Referring Provider (OT): Alysia Penna, MD   Encounter Date: 03/30/2021   OT End of Session - 03/30/21 1811    Visit Number 25    Number of Visits 39    Date for OT Re-Evaluation 05/22/21    Authorization Type [employer]    Authorization Time Period $15 copay for OT, VL:MN    OT Start Time 1320    OT Stop Time 1415    OT Time Calculation (min) 55 min    Activity Tolerance Patient tolerated treatment well    Behavior During Therapy [facility] for tasks assessed/performed           Past Medical History:  Diagnosis Date  . Brain cancer ([place])   . Seizures ([place])   . Thyroid disease    Hypothyroid    Past Surgical History:  Procedure Laterality Date  . brain cancer    . BRAIN SURGERY      There were no vitals filed for this visit.   Subjective Assessment - 03/30/21 1810    Subjective  My parents are taking a trip to the beach.  (Patient has family coming to help take care of him in parent's absence.)    Patient is accompanied by: Family member    Pertinent History Brain Tumor 2001. Hx of Seizures    Limitations Seizures. Fall Risk    Patient Stated Goals Walk again and use a cane. "foot, hand and balance"    Currently in Pain? No/denies    Pain Score 0-No pain           Patient seen for aquatic therapy - entered and exited the water with min assist using stairs and two hand rails and cueing to slide or readjust left hand up/down rail.  Worked on gross motor coordination during walking.  Working on narrower base of support, increasing step length, and maintaining upright midline posture.  As patient improved with this, worked on anticipating turns, turning in both directions, turning without slowing  step speed.  Patient also then worked on alternate arm swing with simple jogging.  Worked on active shoulder abduction/adduction with UE's floating at chest height, then added trunk rotation off stable base.  Patient limited in trunk rotation to right.  Addressed alos in supone with knees flexed - also limited in lower trunk rotation to right.                         OT Short Term Goals - 03/30/21 1814      OT SHORT TERM GOAL #1   Title Pt will be independent with initial HEP 01/26/2021    Time 4    Period Weeks    Status Achieved   coordination HEP   Target Date 01/26/21      OT SHORT TERM GOAL #2   Title Pt will improve wrist extension in LUE to 35 degrees for improving functional use of LUE.    Baseline LUE 30 passive - unable to maintain position actively.    Time 4    Period Weeks    Status Achieved      OT SHORT TERM GOAL #3   Title Pt will improve attention, balance and range of motion in order to complete toilet hygiene with  no physical assistance.    Time 4    Period Weeks    Status Achieved   pt got a bidet and is completing all toileting including hygiene with no assistance.     OT SHORT TERM GOAL #4   Title Pt will perform home management and/or simple meal prep with supervision and good safety awareness    Time 4    Period Weeks    Status Deferred   pt living with parents and not a goal at this time.     OT SHORT TERM GOAL #5   Title Pt will perform all basic ADLs without physical assistance.    Time 4    Period Weeks    Status Achieved   pt reports not needing any physical assistance for bathing, dressing or toileting.     OT SHORT TERM GOAL #6   Title Pt will improve functional use of LUE by scoring 25 or greater on Box and Blocks with LUE.    Baseline RUE 41, LUE 18; 21 lUE 5/10    Time 4    Period Weeks    Status On-going   21 - LUE 03/24/21            OT Long Term Goals - 03/30/21 1815      OT LONG TERM GOAL #1   Title Pt will be  independent with updated HEP    Time 12    Period Weeks    Status On-going      OT LONG TERM GOAL #2   Title Pt will complete environmental scanning with 95% accuracy.    Time 12    Period Weeks    Status On-going      OT LONG TERM GOAL #3   Title Pt will attend to a novel cognitive task for 15 minutes or greater in a moderately distracting environment with no cues for redirection.    Time 12    Period Weeks    Status On-going      OT LONG TERM GOAL #4   Title Pt will achieve wrist extension of 45 degrees or greater in LUE    Baseline 30*    Time 12    Period Weeks    Status Achieved      OT LONG TERM GOAL #5   Title Pt will improve grip strength in LUE to 30 lbs or greater for increase in ability to complete clothing management and other functional tasks with LUE.    Baseline LUE 18 RUE 41    Time 12    Period Weeks    Status On-going      OT LONG TERM GOAL #6   Title Pt will improve fine motor coordination in LUE by completing 9 hole peg test in 2 minutes or less    Baseline 2 pegs in 2 minutes    Time 12    Period Weeks    Status On-going      OT LONG TERM GOAL #7   Title Pt will complete FOTO And score 66% or greater.    Baseline 54%    Time 12    Period Weeks    Status On-going                 Plan - 03/30/21 1811    Clinical Impression Statement Pt with significant balance and coordination impairments, cognition and safety has markedly improved in the pool environment    OT Occupational Profile and History Detailed Assessment-  Review of Records and additional review of physical, cognitive, psychosocial history related to current functional performance    Occupational performance deficits (Please refer to evaluation for details): ADL's    Body Structure / Function / Physical Skills ADL;ROM;UE functional use;FMC;Decreased knowledge of use of DME;Balance;Vision;Dexterity;GMC;Coordination;IADL;Proprioception;Tone;Strength;Pain;Endurance    Cognitive Skills  Attention;Safety Awareness;Sequencing;Orientation;Memory;Understand;Problem Solve    Rehab Potential Good    Clinical Decision Making Several treatment options, min-mod task modification necessary    Comorbidities Affecting Occupational Performance: May have comorbidities impacting occupational performance    Modification or Assistance to Complete Evaluation  No modification of tasks or assist necessary to complete eval    OT Frequency 2x / week    OT Duration 12 weeks   24 visits + eval   OT Treatment/Interventions Self-care/ADL training;Therapeutic exercise;Visual/perceptual remediation/compensation;Patient/family education;Splinting;Neuromuscular education;Moist Heat;Aquatic Therapy;DME and/or AE instruction;Passive range of motion;Therapist, nutritional;Therapeutic activities;Energy conservation;Electrical Stimulation;Manual Therapy    Plan Review initial HEP and add/update prn, aquatics for postural realignment and activation, balance, gross motor coordiantion, and LUE fuctional ROM, prone    Consulted and Agree with Plan of Care Patient;Family member/caregiver   mom and dad   Family Member Consulted Mom and Dad           Patient will benefit from skilled therapeutic intervention in order to improve the following deficits and impairments:   Body Structure / Function / Physical Skills: ADL,ROM,UE functional use,FMC,Decreased knowledge of use of DME,Balance,Vision,Dexterity,GMC,Coordination,IADL,Proprioception,Tone,Strength,Pain,Endurance Cognitive Skills: Attention,Safety Awareness,Sequencing,Orientation,Memory,Understand,Problem Solve     Visit Diagnosis: Hemiplegia and hemiparesis following other cerebrovascular disease affecting left non-dominant side ([facility])  Unsteadiness on feet  Other lack of coordination  Visuospatial deficit  Attention and concentration deficit  Muscle weakness (generalized)  Stiffness of left shoulder, not elsewhere classified    Problem  List Patient Active Problem List   Diagnosis Date Noted  . Slow transit constipation   . Sleep disturbance   . Basal ganglia stroke ([place])   . Incontinence of feces   . Leukocytosis   . Seizures ([place])   . Spastic hemiparesis ([place])   . Cerebrovascular accident (CVA) of right basal ganglia ([place]) 09/30/2020  . CVA (cerebral vascular accident) ([place]) 09/26/2020  . Gait disorder 10/12/2016  . Memory loss 10/12/2016  . Epilepsy ([place]) 03/30/2013  . Malignant neoplasm of brain ([place]) 03/30/2013  . Astrocytoma brain tumor [facility]) 03/27/2012    Mariah Milling, OTR/L 03/30/2021, 6:16 PM  [facility] [address] [place], [place], [address] Phone: [phone]   Fax:  [phone]  Name: Treyven Lafauci MRN: [id number] Date of Birth: 1961-02-04

## 2021-03-31 ENCOUNTER — Ambulatory Visit: Payer: PPO | Admitting: Physical Therapy

## 2021-03-31 ENCOUNTER — Ambulatory Visit: Payer: PPO | Admitting: Occupational Therapy

## 2021-03-31 ENCOUNTER — Other Ambulatory Visit: Payer: Self-pay

## 2021-03-31 ENCOUNTER — Encounter: Payer: Self-pay | Admitting: Occupational Therapy

## 2021-03-31 DIAGNOSIS — I69854 Hemiplegia and hemiparesis following other cerebrovascular disease affecting left non-dominant side: Secondary | ICD-10-CM

## 2021-03-31 DIAGNOSIS — R296 Repeated falls: Secondary | ICD-10-CM

## 2021-03-31 DIAGNOSIS — R2681 Unsteadiness on feet: Secondary | ICD-10-CM

## 2021-03-31 DIAGNOSIS — R2689 Other abnormalities of gait and mobility: Secondary | ICD-10-CM

## 2021-03-31 DIAGNOSIS — R29818 Other symptoms and signs involving the nervous system: Secondary | ICD-10-CM

## 2021-03-31 DIAGNOSIS — M25612 Stiffness of left shoulder, not elsewhere classified: Secondary | ICD-10-CM

## 2021-03-31 DIAGNOSIS — M6281 Muscle weakness (generalized): Secondary | ICD-10-CM

## 2021-03-31 DIAGNOSIS — R41842 Visuospatial deficit: Secondary | ICD-10-CM

## 2021-03-31 DIAGNOSIS — R278 Other lack of coordination: Secondary | ICD-10-CM

## 2021-03-31 DIAGNOSIS — R41840 Attention and concentration deficit: Secondary | ICD-10-CM

## 2021-03-31 NOTE — Therapy (Signed)
[facility] [address] [place], [place], [address] Phone: [phone]   Fax:  [phone]  Occupational Therapy Treatment  Patient Details  Name: Howard White MRN: [id number] Date of Birth: 1961-07-30 Referring Provider (OT): Alysia Penna, MD   Encounter Date: 03/31/2021   OT End of Session - 03/31/21 1322    Visit Number 26    Number of Visits 39    Date for OT Re-Evaluation 05/22/21    Authorization Type [employer]    Authorization Time Period $15 copay for OT, VL:MN    OT Start Time 1320    OT Stop Time 1358    OT Time Calculation (min) 38 min    Activity Tolerance Patient tolerated treatment well    Behavior During Therapy [facility] for tasks assessed/performed           Past Medical History:  Diagnosis Date  . Brain cancer ([place])   . Seizures ([place])   . Thyroid disease    Hypothyroid    Past Surgical History:  Procedure Laterality Date  . brain cancer    . BRAIN SURGERY      There were no vitals filed for this visit.   Subjective Assessment - 03/31/21 1323    Subjective  Pt reports did some exercises before coming to therapy. Pt denies any pain.    Patient is accompanied by: Family member    Pertinent History Brain Tumor 2001. Hx of Seizures    Limitations Seizures. Fall Risk    Patient Stated Goals Walk again and use a cane. "foot, hand and balance"    Currently in Pain? No/denies           Prone on forearms x 60 seconds with emphasis on scapular retraction for scap stability.  X 2  Quadruped x 60 seconds with holding RUE up and looking straight ahead for encouraging and facilitating increased neck extension and retraction and scap retraction and weight bearing into LUE. X 3 with reaching with RUE while in quadruped for cones across midline and superior for encouraging looking superior.                        OT Short Term Goals - 03/30/21 1814      OT SHORT  TERM GOAL #1   Title Pt will be independent with initial HEP 01/26/2021    Time 4    Period Weeks    Status Achieved   coordination HEP   Target Date 01/26/21      OT SHORT TERM GOAL #2   Title Pt will improve wrist extension in LUE to 35 degrees for improving functional use of LUE.    Baseline LUE 30 passive - unable to maintain position actively.    Time 4    Period Weeks    Status Achieved      OT SHORT TERM GOAL #3   Title Pt will improve attention, balance and range of motion in order to complete toilet hygiene with no physical assistance.    Time 4    Period Weeks    Status Achieved   pt got a bidet and is completing all toileting including hygiene with no assistance.     OT SHORT TERM GOAL #4   Title Pt will perform home management and/or simple meal prep with supervision and good safety awareness    Time 4    Period Weeks    Status Deferred   pt living  with parents and not a goal at this time.     OT SHORT TERM GOAL #5   Title Pt will perform all basic ADLs without physical assistance.    Time 4    Period Weeks    Status Achieved   pt reports not needing any physical assistance for bathing, dressing or toileting.     OT SHORT TERM GOAL #6   Title Pt will improve functional use of LUE by scoring 25 or greater on Box and Blocks with LUE.    Baseline RUE 41, LUE 18; 21 lUE 5/10    Time 4    Period Weeks    Status On-going   21 - LUE 03/24/21            OT Long Term Goals - 03/30/21 1815      OT LONG TERM GOAL #1   Title Pt will be independent with updated HEP    Time 12    Period Weeks    Status On-going      OT LONG TERM GOAL #2   Title Pt will complete environmental scanning with 95% accuracy.    Time 12    Period Weeks    Status On-going      OT LONG TERM GOAL #3   Title Pt will attend to a novel cognitive task for 15 minutes or greater in a moderately distracting environment with no cues for redirection.    Time 12    Period Weeks    Status  On-going      OT LONG TERM GOAL #4   Title Pt will achieve wrist extension of 45 degrees or greater in LUE    Baseline 30*    Time 12    Period Weeks    Status Achieved      OT LONG TERM GOAL #5   Title Pt will improve grip strength in LUE to 30 lbs or greater for increase in ability to complete clothing management and other functional tasks with LUE.    Baseline LUE 18 RUE 41    Time 12    Period Weeks    Status On-going      OT LONG TERM GOAL #6   Title Pt will improve fine motor coordination in LUE by completing 9 hole peg test in 2 minutes or less    Baseline 2 pegs in 2 minutes    Time 12    Period Weeks    Status On-going      OT LONG TERM GOAL #7   Title Pt will complete FOTO And score 66% or greater.    Baseline 54%    Time 12    Period Weeks    Status On-going                 Plan - 03/31/21 1352    Clinical Impression Statement Pt did well with prone and quadruped this day. Continue to progress towards goals.    OT Occupational Profile and History Detailed Assessment- Review of Records and additional review of physical, cognitive, psychosocial history related to current functional performance    Occupational performance deficits (Please refer to evaluation for details): ADL's    Body Structure / Function / Physical Skills ADL;ROM;UE functional use;FMC;Decreased knowledge of use of DME;Balance;Vision;Dexterity;GMC;Coordination;IADL;Proprioception;Tone;Strength;Pain;Endurance    Cognitive Skills Attention;Safety Awareness;Sequencing;Orientation;Memory;Understand;Problem Solve    Rehab Potential Good    Clinical Decision Making Several treatment options, min-mod task modification necessary    Comorbidities Affecting Occupational Performance: May have  comorbidities impacting occupational performance    Modification or Assistance to Complete Evaluation  No modification of tasks or assist necessary to complete eval    OT Frequency 2x / week    OT Duration 12  weeks   24 visits + eval   OT Treatment/Interventions Self-care/ADL training;Therapeutic exercise;Visual/perceptual remediation/compensation;Patient/family education;Splinting;Neuromuscular education;Moist Heat;Aquatic Therapy;DME and/or AE instruction;Passive range of motion;Therapist, nutritional;Therapeutic activities;Energy conservation;Electrical Stimulation;Manual Therapy    Plan Review initial HEP and add/update prn, aquatics for postural realignment and activation, balance, gross motor coordiantion, and LUE fuctional ROM, prone    Consulted and Agree with Plan of Care Patient;Family member/caregiver   mom and dad   Family Member Consulted Mom and Dad           Patient will benefit from skilled therapeutic intervention in order to improve the following deficits and impairments:   Body Structure / Function / Physical Skills: ADL,ROM,UE functional use,FMC,Decreased knowledge of use of DME,Balance,Vision,Dexterity,GMC,Coordination,IADL,Proprioception,Tone,Strength,Pain,Endurance Cognitive Skills: Attention,Safety Awareness,Sequencing,Orientation,Memory,Understand,Problem Solve     Visit Diagnosis: Hemiplegia and hemiparesis following other cerebrovascular disease affecting left non-dominant side (HCC)  Unsteadiness on feet  Other lack of coordination  Visuospatial deficit  Attention and concentration deficit  Muscle weakness (generalized)  Stiffness of left shoulder, not elsewhere classified    Problem List Patient Active Problem List   Diagnosis Date Noted  . Slow transit constipation   . Sleep disturbance   . Basal ganglia stroke ([place])   . Incontinence of feces   . Leukocytosis   . Seizures ([place])   . Spastic hemiparesis ([place])   . Cerebrovascular accident (CVA) of right basal ganglia ([place]) 09/30/2020  . CVA (cerebral vascular accident) ([place]) 09/26/2020  . Gait disorder 10/12/2016  . Memory loss 10/12/2016  . Epilepsy ([place]) 03/30/2013  . Malignant neoplasm of  brain ([place]) 03/30/2013  . Astrocytoma brain tumor [facility]) 03/27/2012    Howard White MOT, OTR/L  03/31/2021, 2:08 PM  [name] [address] [place], [place], [address] Phone: [phone]   Fax:  [phone]  Name: Howard White MRN: [id number] Date of Birth: 11-08-1961

## 2021-04-01 NOTE — Therapy (Signed)
[facility] [address] [place], [place], [address] Phone: [phone]   Fax:  [phone]  Physical Therapy Treatment  Patient Details  Name: Howard White MRN: [id number] Date of Birth: 12-Jul-1961 Referring Provider (PT): Letta Pate Luanna Salk, MD   Encounter Date: 03/31/2021   PT End of Session - 04/01/21 1452    Visit Number 22    Number of Visits 29    Date for PT Re-Evaluation 04/20/21    Authorization Type HTA - 10th visit PN    Progress Note Due on Visit 48    PT Start Time 1409    PT Stop Time 1446    PT Time Calculation (min) 37 min    Activity Tolerance Patient tolerated treatment well    Behavior During Therapy Impulsive;WFL for tasks assessed/performed           Past Medical History:  Diagnosis Date  . Brain cancer ([place])   . Seizures ([place])   . Thyroid disease    Hypothyroid    Past Surgical History:  Procedure Laterality Date  . brain cancer    . BRAIN SURGERY      There were no vitals filed for this visit.   Subjective Assessment - 03/31/21 1410    Subjective Sore from the pool yesterday; walked into the den by himself this morning.    Patient is accompained by: Family member   mom and dad   Pertinent History Epilepsy, malignant neoplasm of brain, astrocytoma brain tumor, thyroid disease, brain surgery    Limitations Standing;Walking    Patient Stated Goals Work on his balance and be able to walk by himself.    Currently in Pain? No/denies                             [facility]: [facility] Rhonda W Adult PT Treatment/Exercise - 04/01/21 1501      High Level Balance   High Level Balance Activities Side stepping;Backward walking;Direction changes;Other (comment)    High Level Balance Comments use of floor ladder for stepping in various directions and changing directions in a patter: forwards, lateral, step back, lateral, etc. to L and to R x 3 sets with mod-max A for sequencing, attending to  step placement, for balance, and for slow, controlled stepping.  Cued pt to "pause" between each transition.      Neuro Re-ed    Neuro Re-ed Details  NMR to continue to facilitate controlled, functional weight shifting to side of impairment.  Pt stood with wide BOS in squat while performing reaching to R to take towels in RUE and then weight shift to the left to place towel in basket slightly forward and L without rotation.  Then retrieved objects from L basket with LUE and rotated to place on chair.  Pt required mod A to maintain upright trunk and prevent forward LOB, intermittent hand over hand assistance for coordination of reaching and picking up items in LUE, assistance to maintain squat with lowering into sitting on mat and cues for gaze to follow hand/UE.  Pt's LE fatigued quickly and only able to perform 2 at a time.                    PT Short Term Goals - 03/25/21 1809      PT SHORT TERM GOAL #1   Title Pt will report compliance with updated HEP with parental supervision 5x a week    Baseline  03/17/21: met with current HEP. HEP updated today.    Status Achieved      PT SHORT TERM GOAL #2   Title Pt will perform all stand pivot transfers mod I    Baseline 03/17/21: at supervision level currently, improved from min/min guard assist    Status Partially Met      PT SHORT TERM GOAL #3   Title Pt will improve BERG to >/= 46/56 to indicate decreased falls risk    Baseline 03/19/21: 45/56 scored today, improved just not to goal level    Status Partially Met      PT SHORT TERM GOAL #4   Title Pt will demonstrate decreased falls risk when ambulating with cane as indicated by increase in DGI to >/= 11/24    Baseline 03/19/21: 12/24 scored today, improved from 10/24 just not to goal level    Status Achieved             PT Long Term Goals - 02/19/21 1324      PT LONG TERM GOAL #1   Title Pt will demonstrate ability to perform final HEP and walking program with supervision     Baseline Updated HEP and initiated walking at home with supervision    Time 8    Period Weeks    Status On-going    Target Date 04/20/21      PT LONG TERM GOAL #2   Title Pt will report improvement in LE function on FOTO to >/= 66%    Baseline 54%    Time 8    Period Weeks    Status On-going    Target Date 04/20/21      PT LONG TERM GOAL #3   Title Pt will be able to ambulate in and out of clinic with [place] and supervision    Time 8    Period Weeks    Status New    Target Date 04/20/21      PT LONG TERM GOAL #4   Title Pt will increase BERG to >/= 51/56    Baseline 41/56    Time 8    Period Weeks    Status New    Target Date 04/20/21      PT LONG TERM GOAL #5   Title Pt will increase DGI to >/= 14/24    Baseline 10/24    Time 8    Period Weeks    Status Revised    Target Date 04/20/21      PT LONG TERM GOAL #6   Title Pt will demonstrate ability to safely stand from the floor with supervision and use of UE    Time 8    Period Weeks    Status New    Target Date 04/20/21      PT LONG TERM GOAL #7   Title Pt will ambulate x 500' outside with LRAD and supervision and will negotiate 12 stairs with R rail (ascending) with supervision to be able to access upstairs bedroom    Time 8    Period Weeks    Status On-going    Target Date 04/20/21                 Plan - 04/01/21 1454    Clinical Impression Statement Combined dual tasking of multi-directional stepping with verbalizing sequence to increase attention, sequencing and motor control.  Required mod-max verbal cues to recall sequence and for safety; required mod A for balance and to slow movement.  Continued weight shift training towards side of impairment with functional reaching task in squat position; pt's LE fatigued quickly and pt only able to perform 2 reps at a time.  Will continue to address and progress towards LTG.    Personal Factors and Comorbidities Comorbidity 3+;Past/Current Experience;Social  Background    Comorbidities Epilepsy, malignant neoplasm of brain, astrocytoma brain tumor, thyroid disease, brain surgery, lives with elderly parents    Examination-Activity Limitations [place];Locomotion Level;Stairs;Transfers;Bathing;Toileting    Examination-Participation Restrictions Community Activity;Meal Prep;Shop    Stability/Clinical Decision Making Evolving/Moderate complexity    Rehab Potential Good    PT Frequency 2x / week    PT Duration 8 weeks    PT Treatment/Interventions ADLs/Self Care Home Management;Aquatic Therapy;DME Instruction;Gait training;Cognitive remediation;Stair training;Patient/family education;Functional mobility training;Orthotic Fit/Training;Therapeutic activities;Therapeutic exercise;Balance training;Neuromuscular re-education;Passive range of motion;Vestibular;Visual/perceptual remediation/compensation    PT Next Visit Plan Standing at counter and reaching to encourage trunk elongation - more functional movements.  Gait training/safety with cane; stair negotiation - work on weaning off of wheelchair.  resisted sit <> stand, NMR/WB focusing on attention to L, tall kneeling, balance when turning, reorientation to midline/weight shifting/head and trunk righting.  Work on turns.  Perturbations during balance/gait.    PT Home Exercise Plan Access Code: [email or web address]    Consulted and Agree with Plan of Care Patient;Family member/caregiver    Family Member Consulted parents           Patient will benefit from skilled therapeutic intervention in order to improve the following deficits and impairments:  Abnormal gait,Decreased balance,Decreased cognition,Decreased coordination,Difficulty walking,Impaired tone,Impaired UE functional use,Impaired vision/preception,Postural dysfunction,Pain  Visit Diagnosis: Repeated falls  Other abnormalities of gait and mobility  Unsteadiness on feet  Other symptoms and signs involving the nervous system  Other lack of  coordination  Hemiplegia and hemiparesis following other cerebrovascular disease affecting left non-dominant side [facility])     Problem List Patient Active Problem List   Diagnosis Date Noted  . Slow transit constipation   . Sleep disturbance   . Basal ganglia stroke ([place])   . Incontinence of feces   . Leukocytosis   . Seizures ([place])   . Spastic hemiparesis ([place])   . Cerebrovascular accident (CVA) of right basal ganglia ([place]) 09/30/2020  . CVA (cerebral vascular accident) ([place]) 09/26/2020  . Gait disorder 10/12/2016  . Memory loss 10/12/2016  . Epilepsy ([place]) 03/30/2013  . Malignant neoplasm of brain ([place]) 03/30/2013  . Astrocytoma brain tumor ([place]) 03/27/2012    Rico Junker, PT, DPT 04/01/21    3:09 PM    [facility] [address] [place], [place], [address] Phone: [phone]   Fax:  [phone]  Name: Howard White MRN: [id number] Date of Birth: Feb 21, 1961

## 2021-04-02 ENCOUNTER — Ambulatory Visit: Payer: PPO | Admitting: Physical Therapy

## 2021-04-03 ENCOUNTER — Ambulatory Visit: Payer: PPO | Admitting: Physical Therapy

## 2021-04-03 ENCOUNTER — Other Ambulatory Visit: Payer: Self-pay

## 2021-04-03 DIAGNOSIS — R29818 Other symptoms and signs involving the nervous system: Secondary | ICD-10-CM

## 2021-04-03 DIAGNOSIS — I69854 Hemiplegia and hemiparesis following other cerebrovascular disease affecting left non-dominant side: Secondary | ICD-10-CM | POA: Diagnosis not present

## 2021-04-03 DIAGNOSIS — R278 Other lack of coordination: Secondary | ICD-10-CM

## 2021-04-03 DIAGNOSIS — R2681 Unsteadiness on feet: Secondary | ICD-10-CM

## 2021-04-03 DIAGNOSIS — R2689 Other abnormalities of gait and mobility: Secondary | ICD-10-CM

## 2021-04-03 DIAGNOSIS — R296 Repeated falls: Secondary | ICD-10-CM

## 2021-04-03 NOTE — Therapy (Signed)
[facility] [address] [place], [place], [address] Phone: [phone]   Fax:  [phone]  Physical Therapy Treatment  Patient Details  Name: Howard White MRN: [id number] Date of Birth: 1961/10/26 Referring Provider (PT): Letta Pate Luanna Salk, MD   Encounter Date: 04/03/2021   PT End of Session - 04/03/21 1410    Visit Number 23    Number of Visits 29    Date for PT Re-Evaluation 04/20/21    Authorization Type HTA - 10th visit PN    Progress Note Due on Visit 30    PT Start Time 0935    PT Stop Time 1017    PT Time Calculation (min) 42 min    Activity Tolerance Patient tolerated treatment well    Behavior During Therapy Impulsive;WFL for tasks assessed/performed           Past Medical History:  Diagnosis Date  . Brain cancer ([place])   . Seizures ([place])   . Thyroid disease    Hypothyroid    Past Surgical History:  Procedure Laterality Date  . brain cancer    . BRAIN SURGERY      There were no vitals filed for this visit.   Subjective Assessment - 04/03/21 0939    Subjective Still feeling sore but uses a heating pad and that works well.  Is working on balance and stretching at home with mom.  Still walking at home with dad.    Patient is accompained by: Family member   mom and dad   Pertinent History Epilepsy, malignant neoplasm of brain, astrocytoma brain tumor, thyroid disease, brain surgery    Limitations Standing;Walking    Patient Stated Goals Work on his balance and be able to walk by himself.    Currently in Pain? No/denies                             [facility] Adult PT Treatment/Exercise - 04/03/21 1132      Transfers   Transfers Sit to Stand;Stand to Sit;Stand Pivot Transfers    Sit to Stand 5: Supervision    Sit to Stand Details (indicate cue type and reason) demonstrating improved control and balance once standing, without UE support    Stand to Sit 5: Supervision    Stand  to Sit Details minimal cues to pause and control descent after pivoting    Stand Pivot Transfers 5: Supervision;4: Min assist    Stand Pivot Transfer Details (indicate cue type and reason) supervision for pivots to R.  Pt continues to attempt to do full turn to R when pivoting back to L.  Continues to require therapist's verbal and visual cues to sequence and perform controlled pivot to L.      Ambulation/Gait   Ambulation/Gait Yes    Ambulation/Gait Assistance 4: Min guard    Ambulation/Gait Assistance Details cues for more upright trunk and gaze; when looking forwards pt noted to run into objects on L.  Pt continues to require assistance to find destination on L.    Ambulation Distance (Feet) 115 Feet    Assistive device Straight cane    Gait Pattern Step-through pattern;Decreased arm swing - left;Decreased stance time - left;Trunk flexed;Poor foot clearance - left    Ambulation Surface Level;Indoor      Self-Care   Self-Care Other Self-Care Comments    Other Self-Care Comments  discussed safety concerns with walking into and out of movie theater including  darkness, busy/distracting environment with people and patterns on floor, stairs and ramps.  Advised pt to continue to use chair for now.      Neuro Re-ed    Neuro Re-ed Details  NMR in // bars: standing on 4" step performed L then R foot tap downs forwards, laterally and behind pt x 10 reps focusing on weight shifting, controlled stepping in variety of directions, and grading of movement.  Cues to pause after each tap.  Transitioned to standing on step with L hip touching bar on L as boundary to prevent lateral lean too far to L.  Cued pt to look to the R and reach up, forwards and R, shifting away from bar in order to take object from tall target, return to midline using bar as boundaring and then shifting back over to return object to tall target.  Performed x 10 reps with min A and mod cues to keep head and trunk over BOS and to look at  target before initiating weight shift.  Continuous cues for slow, controlled movement.                    PT Short Term Goals - 03/25/21 1809      PT SHORT TERM GOAL #1   Title Pt will report compliance with updated HEP with parental supervision 5x a week    Baseline 03/17/21: met with current HEP. HEP updated today.    Status Achieved      PT SHORT TERM GOAL #2   Title Pt will perform all stand pivot transfers mod I    Baseline 03/17/21: at supervision level currently, improved from min/min guard assist    Status Partially Met      PT SHORT TERM GOAL #3   Title Pt will improve BERG to >/= 46/56 to indicate decreased falls risk    Baseline 03/19/21: 45/56 scored today, improved just not to goal level    Status Partially Met      PT SHORT TERM GOAL #4   Title Pt will demonstrate decreased falls risk when ambulating with cane as indicated by increase in DGI to >/= 11/24    Baseline 03/19/21: 12/24 scored today, improved from 10/24 just not to goal level    Status Achieved             PT Long Term Goals - 02/19/21 1324      PT LONG TERM GOAL #1   Title Pt will demonstrate ability to perform final HEP and walking program with supervision    Baseline Updated HEP and initiated walking at home with supervision    Time 8    Period Weeks    Status On-going    Target Date 04/20/21      PT LONG TERM GOAL #2   Title Pt will report improvement in LE function on FOTO to >/= 66%    Baseline 54%    Time 8    Period Weeks    Status On-going    Target Date 04/20/21      PT LONG TERM GOAL #3   Title Pt will be able to ambulate in and out of clinic with [place] and supervision    Time 8    Period Weeks    Status New    Target Date 04/20/21      PT LONG TERM GOAL #4   Title Pt will increase BERG to >/= 51/56    Baseline 41/56    Time 8  Period Weeks    Status New    Target Date 04/20/21      PT LONG TERM GOAL #5   Title Pt will increase DGI to >/= 14/24    Baseline  10/24    Time 8    Period Weeks    Status Revised    Target Date 04/20/21      PT LONG TERM GOAL #6   Title Pt will demonstrate ability to safely stand from the floor with supervision and use of UE    Time 8    Period Weeks    Status New    Target Date 04/20/21      PT LONG TERM GOAL #7   Title Pt will ambulate x 500' outside with LRAD and supervision and will negotiate 12 stairs with R rail (ascending) with supervision to be able to access upstairs bedroom    Time 8    Period Weeks    Status On-going    Target Date 04/20/21                 Plan - 04/03/21 1530    Clinical Impression Statement Pt requires less physical assistance for stability and balance but continues to require cues for safety, attention to L and sequencing when performing basic transfers and ambulation in controlled environment.  Treatment session focused on breaking down weight shifting and stepping and slowing down movement to focus on attention, sequencing and postural control.  Required max-total verbal and tactile cues to grade movement when reaching and stepping.  Will continue to address and progress towards LTG.    Personal Factors and Comorbidities Comorbidity 3+;Past/Current Experience;Social Background    Comorbidities Epilepsy, malignant neoplasm of brain, astrocytoma brain tumor, thyroid disease, brain surgery, lives with elderly parents    Examination-Activity Limitations [place];Locomotion Level;Stairs;Transfers;Bathing;Toileting    Examination-Participation Restrictions Community Activity;Meal Prep;Shop    Stability/Clinical Decision Making Evolving/Moderate complexity    Rehab Potential Good    PT Frequency 2x / week    PT Duration 8 weeks    PT Treatment/Interventions ADLs/Self Care Home Management;Aquatic Therapy;DME Instruction;Gait training;Cognitive remediation;Stair training;Patient/family education;Functional mobility training;Orthotic Fit/Training;Therapeutic activities;Therapeutic  exercise;Balance training;Neuromuscular re-education;Passive range of motion;Vestibular;Visual/perceptual remediation/compensation    PT Next Visit Plan Gait training/safety with cane begin training outside and in more distracting environment and focus on attention to L/visual scanning; stair negotiation - work on weaning off of wheelchair.  NMR/WB focusing on attention to L, tall kneeling, balance when turning, reorientation to midline/weight shifting/head and trunk righting.  Perturbations during balance/gait.  SLOW DOWN MOVEMENT    PT Home Exercise Plan Access Code: [email or web address]    Consulted and Agree with Plan of Care Patient;Family member/caregiver    Family Member Consulted parents           Patient will benefit from skilled therapeutic intervention in order to improve the following deficits and impairments:  Abnormal gait,Decreased balance,Decreased cognition,Decreased coordination,Difficulty walking,Impaired tone,Impaired UE functional use,Impaired vision/preception,Postural dysfunction,Pain  Visit Diagnosis: Repeated falls  Other abnormalities of gait and mobility  Unsteadiness on feet  Other symptoms and signs involving the nervous system  Other lack of coordination  Hemiplegia and hemiparesis following other cerebrovascular disease affecting left non-dominant side [facility])     Problem List Patient Active Problem List   Diagnosis Date Noted  . Slow transit constipation   . Sleep disturbance   . Basal ganglia stroke ([place])   . Incontinence of feces   . Leukocytosis   . Seizures ([place])   . Spastic  hemiparesis ([place])   . Cerebrovascular accident (CVA) of right basal ganglia ([place]) 09/30/2020  . CVA (cerebral vascular accident) ([place]) 09/26/2020  . Gait disorder 10/12/2016  . Memory loss 10/12/2016  . Epilepsy ([place]) 03/30/2013  . Malignant neoplasm of brain ([place]) 03/30/2013  . Astrocytoma brain tumor ([place]) 03/27/2012    Rico Junker, PT, DPT 04/03/21    3:46 PM    [facility] [address] [place], [place], [address] Phone: [phone]   Fax:  [phone]  Name: July Nickson MRN: [id number] Date of Birth: 06-08-1961

## 2021-04-06 ENCOUNTER — Ambulatory Visit: Payer: PPO | Admitting: Occupational Therapy

## 2021-04-07 ENCOUNTER — Ambulatory Visit: Payer: PPO | Admitting: Occupational Therapy

## 2021-04-07 ENCOUNTER — Encounter: Payer: Self-pay | Admitting: Occupational Therapy

## 2021-04-07 ENCOUNTER — Other Ambulatory Visit: Payer: Self-pay

## 2021-04-07 ENCOUNTER — Ambulatory Visit: Payer: PPO | Admitting: Physical Therapy

## 2021-04-07 VITALS — BP 119/71 | HR 67 | Temp 98.6°F

## 2021-04-07 VITALS — BP 120/80 | HR 78

## 2021-04-07 DIAGNOSIS — R278 Other lack of coordination: Secondary | ICD-10-CM

## 2021-04-07 DIAGNOSIS — R350 Frequency of micturition: Secondary | ICD-10-CM | POA: Diagnosis not present

## 2021-04-07 DIAGNOSIS — I69854 Hemiplegia and hemiparesis following other cerebrovascular disease affecting left non-dominant side: Secondary | ICD-10-CM

## 2021-04-07 DIAGNOSIS — R296 Repeated falls: Secondary | ICD-10-CM

## 2021-04-07 DIAGNOSIS — I639 Cerebral infarction, unspecified: Secondary | ICD-10-CM | POA: Diagnosis not present

## 2021-04-07 DIAGNOSIS — R41840 Attention and concentration deficit: Secondary | ICD-10-CM

## 2021-04-07 DIAGNOSIS — I6381 Other cerebral infarction due to occlusion or stenosis of small artery: Secondary | ICD-10-CM | POA: Diagnosis not present

## 2021-04-07 DIAGNOSIS — M25612 Stiffness of left shoulder, not elsewhere classified: Secondary | ICD-10-CM

## 2021-04-07 DIAGNOSIS — M6281 Muscle weakness (generalized): Secondary | ICD-10-CM

## 2021-04-07 DIAGNOSIS — R5383 Other fatigue: Secondary | ICD-10-CM | POA: Diagnosis not present

## 2021-04-07 NOTE — Therapy (Signed)
[facility] [address] [place], [place], [address] Phone: [phone]   Fax:  [phone]  Occupational Therapy Treatment  Patient Details  Name: Howard White MRN: [id number] Date of Birth: 01/17/1961 Referring Provider (OT): Alysia Penna, MD   Encounter Date: 04/07/2021   OT End of Session - 04/07/21 1319    Visit Number 27    Number of Visits 39    Date for OT Re-Evaluation 05/22/21    Authorization Type [employer]    Authorization Time Period $15 copay for OT, VL:MN    OT Start Time 1318    OT Stop Time 1356    OT Time Calculation (min) 38 min    Activity Tolerance Patient tolerated treatment well    Behavior During Therapy [facility] for tasks assessed/performed           Past Medical History:  Diagnosis Date  . Brain cancer ([place])   . Seizures ([place])   . Thyroid disease    Hypothyroid    Past Surgical History:  Procedure Laterality Date  . brain cancer    . BRAIN SURGERY      Vitals:   04/07/21 1329  BP: 119/71  Pulse: 67  Temp: 98.6 F (37 C)     Subjective Assessment - 04/07/21 1320    Subjective  Yesterday was not a great day - i have people coming to take care of me when my parents go out of town and it messed with my brain.    Patient is accompanied by: Family member    Pertinent History Brain Tumor 2001. Hx of Seizures    Limitations Seizures. Fall Risk    Patient Stated Goals Walk again and use a cane. "foot, hand and balance"    Currently in Pain? No/denies             Pt complaining of decline in overall balance and unsteadiness on feet and strength and decline in cognition. Vitals assessed and were stable however pt advised to go to urgent care prior to PCP visit on Friday.   Coordination with LUE with stacking connect four chips. Pt presents with possibly more tone in LUE elbow flexion. Pt then placed chips into bowl with LUE with emphasis on slowing down and moving  step-by-step. Pt with decrease frustration tolerance with task this day with frustration with mistakes. Pt with mod drops and mod cues for attention to L side to not pull bowl.                      OT Short Term Goals - 03/30/21 1814      OT SHORT TERM GOAL #1   Title Pt will be independent with initial HEP 01/26/2021    Time 4    Period Weeks    Status Achieved   coordination HEP   Target Date 01/26/21      OT SHORT TERM GOAL #2   Title Pt will improve wrist extension in LUE to 35 degrees for improving functional use of LUE.    Baseline LUE 30 passive - unable to maintain position actively.    Time 4    Period Weeks    Status Achieved      OT SHORT TERM GOAL #3   Title Pt will improve attention, balance and range of motion in order to complete toilet hygiene with no physical assistance.    Time 4    Period Weeks    Status Achieved  pt got a bidet and is completing all toileting including hygiene with no assistance.     OT SHORT TERM GOAL #4   Title Pt will perform home management and/or simple meal prep with supervision and good safety awareness    Time 4    Period Weeks    Status Deferred   pt living with parents and not a goal at this time.     OT SHORT TERM GOAL #5   Title Pt will perform all basic ADLs without physical assistance.    Time 4    Period Weeks    Status Achieved   pt reports not needing any physical assistance for bathing, dressing or toileting.     OT SHORT TERM GOAL #6   Title Pt will improve functional use of LUE by scoring 25 or greater on Box and Blocks with LUE.    Baseline RUE 41, LUE 18; 21 lUE 5/10    Time 4    Period Weeks    Status On-going   21 - LUE 03/24/21            OT Long Term Goals - 03/30/21 1815      OT LONG TERM GOAL #1   Title Pt will be independent with updated HEP    Time 12    Period Weeks    Status On-going      OT LONG TERM GOAL #2   Title Pt will complete environmental scanning with 95% accuracy.     Time 12    Period Weeks    Status On-going      OT LONG TERM GOAL #3   Title Pt will attend to a novel cognitive task for 15 minutes or greater in a moderately distracting environment with no cues for redirection.    Time 12    Period Weeks    Status On-going      OT LONG TERM GOAL #4   Title Pt will achieve wrist extension of 45 degrees or greater in LUE    Baseline 30*    Time 12    Period Weeks    Status Achieved      OT LONG TERM GOAL #5   Title Pt will improve grip strength in LUE to 30 lbs or greater for increase in ability to complete clothing management and other functional tasks with LUE.    Baseline LUE 18 RUE 41    Time 12    Period Weeks    Status On-going      OT LONG TERM GOAL #6   Title Pt will improve fine motor coordination in LUE by completing 9 hole peg test in 2 minutes or less    Baseline 2 pegs in 2 minutes    Time 12    Period Weeks    Status On-going      OT LONG TERM GOAL #7   Title Pt will complete FOTO And score 66% or greater.    Baseline 54%    Time 12    Period Weeks    Status On-going                 Plan - 04/07/21 1431    Clinical Impression Statement Pt with some regression this day with overall affect, frustration tolerance, increased confusion and decreased balance and strength. Vitals were stable.    OT Occupational Profile and History Detailed Assessment- Review of Records and additional review of physical, cognitive, psychosocial history related to current functional performance  Occupational performance deficits (Please refer to evaluation for details): ADL's    Body Structure / Function / Physical Skills ADL;ROM;UE functional use;FMC;Decreased knowledge of use of DME;Balance;Vision;Dexterity;GMC;Coordination;IADL;Proprioception;Tone;Strength;Pain;Endurance    Cognitive Skills Attention;Safety Awareness;Sequencing;Orientation;Memory;Understand;Problem Solve    Rehab Potential Good    Clinical Decision Making Several  treatment options, min-mod task modification necessary    Comorbidities Affecting Occupational Performance: May have comorbidities impacting occupational performance    Modification or Assistance to Complete Evaluation  No modification of tasks or assist necessary to complete eval    OT Frequency 2x / week    OT Duration 12 weeks   24 visits + eval   OT Treatment/Interventions Self-care/ADL training;Therapeutic exercise;Visual/perceptual remediation/compensation;Patient/family education;Splinting;Neuromuscular education;Moist Heat;Aquatic Therapy;DME and/or AE instruction;Passive range of motion;Therapist, nutritional;Therapeutic activities;Energy conservation;Electrical Stimulation;Manual Therapy    Plan Review initial HEP and add/update prn, aquatics for postural realignment and activation, balance, gross motor coordiantion, and LUE fuctional ROM, prone    Consulted and Agree with Plan of Care Patient;Family member/caregiver   mom and dad   Family Member Consulted Mom and Dad           Patient will benefit from skilled therapeutic intervention in order to improve the following deficits and impairments:   Body Structure / Function / Physical Skills: ADL,ROM,UE functional use,FMC,Decreased knowledge of use of DME,Balance,Vision,Dexterity,GMC,Coordination,IADL,Proprioception,Tone,Strength,Pain,Endurance Cognitive Skills: Attention,Safety Awareness,Sequencing,Orientation,Memory,Understand,Problem Solve     Visit Diagnosis: Attention and concentration deficit  Muscle weakness (generalized)  Other lack of coordination  Hemiplegia and hemiparesis following other cerebrovascular disease affecting left non-dominant side (HCC)  Stiffness of left shoulder, not elsewhere classified    Problem List Patient Active Problem List   Diagnosis Date Noted  . Slow transit constipation   . Sleep disturbance   . Basal ganglia stroke ([place])   . Incontinence of feces   . Leukocytosis   .  Seizures ([place])   . Spastic hemiparesis ([place])   . Cerebrovascular accident (CVA) of right basal ganglia ([place]) 09/30/2020  . CVA (cerebral vascular accident) ([place]) 09/26/2020  . Gait disorder 10/12/2016  . Memory loss 10/12/2016  . Epilepsy ([place]) 03/30/2013  . Malignant neoplasm of brain ([place]) 03/30/2013  . Astrocytoma brain tumor [facility]) 03/27/2012    Zachery Conch MOT, OTR/L  04/07/2021, 2:34 PM  [facility] [address] [place], [place], [address] Phone: [phone]   Fax:  [phone]  Name: Howard White MRN: [id number] Date of Birth: 1961-08-18

## 2021-04-07 NOTE — Therapy (Addendum)
[facility] [address] [place], [place], [address] Phone: [phone]   Fax:  [phone]  Physical Therapy Treatment - Arrive No Charge PT Discharge  Patient Details  Name: Howard White MRN: [id number] Date of Birth: 11/02/1961 Referring Provider (PT): Letta Pate Luanna Salk, MD   Encounter Date: 04/07/2021   PT End of Session - 04/07/21 1431    Visit Number 66   Arrive no charge   Number of Visits 29    Date for PT Re-Evaluation 04/20/21    Authorization Type HTA - 10th visit PN    Progress Note Due on Visit 30    Activity Tolerance Treatment limited secondary to medical complications (Comment)    Behavior During Therapy Flat affect           Past Medical History:  Diagnosis Date  . Brain cancer ([place])   . Seizures ([place])   . Thyroid disease    Hypothyroid    Past Surgical History:  Procedure Laterality Date  . brain cancer    . BRAIN SURGERY      Vitals:   04/07/21 1406 04/07/21 1410  BP: 110/70 120/80  Pulse: 63 78  SpO2: 90% 95%     Subjective Assessment - 04/07/21 1405    Subjective Pt with flat affect, slightly more confusion, and more imbalance today.  Had to cancel pool yesterday.  Did have a fall in the bed room but did not hit head.  OT checked for fever, none noted.    Patient is accompained by: Family member   mom and dad   Pertinent History Epilepsy, malignant neoplasm of brain, astrocytoma brain tumor, thyroid disease, brain surgery    Limitations Standing;Walking    Patient Stated Goals Work on his balance and be able to walk by himself.    Currently in Pain? No/denies           Performed assessment of vitals in sitting and standing with no significant change in vitals from sit > stand.  Pt noted to have slightly lower Sp02 when sitting: 90%.  Pt noted to have flat affect today, greater perseveration (on w/c brakes), increased spasticity, and required increased assistance to stand  today.  Therapist adjusted/tightened brakes for safety.  Recommended to parents that patient go to PCP or urgent care due to sudden change in status. Will follow up at next session if pt is medically able to participate.   Visit Diagnosis: Repeated falls     Problem List Patient Active Problem List   Diagnosis Date Noted  . Slow transit constipation   . Sleep disturbance   . Basal ganglia stroke ([place])   . Incontinence of feces   . Leukocytosis   . Seizures ([place])   . Spastic hemiparesis ([place])   . Cerebrovascular accident (CVA) of right basal ganglia ([place]) 09/30/2020  . CVA (cerebral vascular accident) ([place]) 09/26/2020  . Gait disorder 10/12/2016  . Memory loss 10/12/2016  . Epilepsy ([place]) 03/30/2013  . Malignant neoplasm of brain ([place]) 03/30/2013  . Astrocytoma brain tumor ([place]) 03/27/2012     PHYSICAL THERAPY DISCHARGE SUMMARY  Visits from Start of Care: 23  Current functional level related to goals / functional outcomes: Following this session pt presented to PCP for further medical assessment; pt tested for UTI.  The following day pt continued to decline and experience more falls; EMS contacted and pt taken to ED where he was found to have acute CVA.  Pt admitted to hospital.  Will  D/C from outpatient rehab at this time.  Pt will require new physician prescription to re-initiate PT when medically stable.     Remaining deficits: Impaired coordination, impaired postural control, balance, gait, impaired strength, falls   Education / Equipment: HEP  Plan: Patient agrees to discharge.  Patient goals were not met. Patient is being discharged due to a change in medical status.  ?????      Rico Junker, PT, DPT 04/07/21    2:35 PM  [facility] [address] [place], [place], [address] Phone: [phone]   Fax:  [phone]  Name: Howard White MRN: [id number] Date of Birth: 01-05-1961

## 2021-04-08 ENCOUNTER — Emergency Department (HOSPITAL_COMMUNITY): Payer: PPO

## 2021-04-08 ENCOUNTER — Telehealth: Payer: Self-pay | Admitting: Adult Health

## 2021-04-08 ENCOUNTER — Encounter (HOSPITAL_COMMUNITY): Payer: Self-pay | Admitting: Emergency Medicine

## 2021-04-08 ENCOUNTER — Inpatient Hospital Stay (HOSPITAL_COMMUNITY)
Admission: EM | Admit: 2021-04-08 | Discharge: 2021-04-14 | DRG: 065 | Disposition: A | Discharge status: Rehabilitation Facility | Payer: PPO | Attending: Internal Medicine | Admitting: Internal Medicine

## 2021-04-08 ENCOUNTER — Inpatient Hospital Stay (HOSPITAL_COMMUNITY): Payer: PPO

## 2021-04-08 DIAGNOSIS — G8114 Spastic hemiplegia affecting left nondominant side: Secondary | ICD-10-CM | POA: Diagnosis not present

## 2021-04-08 DIAGNOSIS — J341 Cyst and mucocele of nose and nasal sinus: Secondary | ICD-10-CM | POA: Diagnosis not present

## 2021-04-08 DIAGNOSIS — E039 Hypothyroidism, unspecified: Secondary | ICD-10-CM | POA: Diagnosis not present

## 2021-04-08 DIAGNOSIS — Z20822 Contact with and (suspected) exposure to covid-19: Secondary | ICD-10-CM | POA: Diagnosis not present

## 2021-04-08 DIAGNOSIS — W19XXXA Unspecified fall, initial encounter: Secondary | ICD-10-CM | POA: Diagnosis not present

## 2021-04-08 DIAGNOSIS — R0902 Hypoxemia: Secondary | ICD-10-CM | POA: Diagnosis not present

## 2021-04-08 DIAGNOSIS — I69354 Hemiplegia and hemiparesis following cerebral infarction affecting left non-dominant side: Secondary | ICD-10-CM | POA: Diagnosis not present

## 2021-04-08 DIAGNOSIS — Z808 Family history of malignant neoplasm of other organs or systems: Secondary | ICD-10-CM

## 2021-04-08 DIAGNOSIS — Z833 Family history of diabetes mellitus: Secondary | ICD-10-CM | POA: Diagnosis not present

## 2021-04-08 DIAGNOSIS — R1310 Dysphagia, unspecified: Secondary | ICD-10-CM | POA: Diagnosis not present

## 2021-04-08 DIAGNOSIS — G40909 Epilepsy, unspecified, not intractable, without status epilepticus: Secondary | ICD-10-CM | POA: Diagnosis not present

## 2021-04-08 DIAGNOSIS — Z85841 Personal history of malignant neoplasm of brain: Secondary | ICD-10-CM

## 2021-04-08 DIAGNOSIS — E739 Lactose intolerance, unspecified: Secondary | ICD-10-CM | POA: Diagnosis not present

## 2021-04-08 DIAGNOSIS — Z79899 Other long term (current) drug therapy: Secondary | ICD-10-CM | POA: Diagnosis not present

## 2021-04-08 DIAGNOSIS — G4700 Insomnia, unspecified: Secondary | ICD-10-CM | POA: Diagnosis present

## 2021-04-08 DIAGNOSIS — R400 Somnolence: Secondary | ICD-10-CM | POA: Diagnosis not present

## 2021-04-08 DIAGNOSIS — I6389 Other cerebral infarction: Secondary | ICD-10-CM

## 2021-04-08 DIAGNOSIS — Z823 Family history of stroke: Secondary | ICD-10-CM

## 2021-04-08 DIAGNOSIS — N179 Acute kidney failure, unspecified: Secondary | ICD-10-CM | POA: Diagnosis not present

## 2021-04-08 DIAGNOSIS — I63311 Cerebral infarction due to thrombosis of right middle cerebral artery: Secondary | ICD-10-CM

## 2021-04-08 DIAGNOSIS — F419 Anxiety disorder, unspecified: Secondary | ICD-10-CM | POA: Diagnosis present

## 2021-04-08 DIAGNOSIS — Z9181 History of falling: Secondary | ICD-10-CM | POA: Diagnosis not present

## 2021-04-08 DIAGNOSIS — I10 Essential (primary) hypertension: Secondary | ICD-10-CM | POA: Diagnosis not present

## 2021-04-08 DIAGNOSIS — R262 Difficulty in walking, not elsewhere classified: Secondary | ICD-10-CM

## 2021-04-08 DIAGNOSIS — I6930 Unspecified sequelae of cerebral infarction: Secondary | ICD-10-CM

## 2021-04-08 DIAGNOSIS — Z79890 Hormone replacement therapy (postmenopausal): Secondary | ICD-10-CM | POA: Diagnosis not present

## 2021-04-08 DIAGNOSIS — R296 Repeated falls: Secondary | ICD-10-CM | POA: Diagnosis not present

## 2021-04-08 DIAGNOSIS — I6782 Cerebral ischemia: Secondary | ICD-10-CM | POA: Diagnosis not present

## 2021-04-08 DIAGNOSIS — N390 Urinary tract infection, site not specified: Secondary | ICD-10-CM | POA: Diagnosis present

## 2021-04-08 DIAGNOSIS — I739 Peripheral vascular disease, unspecified: Secondary | ICD-10-CM | POA: Diagnosis not present

## 2021-04-08 DIAGNOSIS — Z993 Dependence on wheelchair: Secondary | ICD-10-CM

## 2021-04-08 DIAGNOSIS — C719 Malignant neoplasm of brain, unspecified: Secondary | ICD-10-CM | POA: Diagnosis present

## 2021-04-08 DIAGNOSIS — E785 Hyperlipidemia, unspecified: Secondary | ICD-10-CM | POA: Diagnosis present

## 2021-04-08 DIAGNOSIS — R410 Disorientation, unspecified: Secondary | ICD-10-CM | POA: Diagnosis not present

## 2021-04-08 DIAGNOSIS — J80 Acute respiratory distress syndrome: Secondary | ICD-10-CM | POA: Diagnosis not present

## 2021-04-08 DIAGNOSIS — R32 Unspecified urinary incontinence: Secondary | ICD-10-CM | POA: Diagnosis not present

## 2021-04-08 DIAGNOSIS — I69391 Dysphagia following cerebral infarction: Secondary | ICD-10-CM | POA: Diagnosis not present

## 2021-04-08 DIAGNOSIS — I6381 Other cerebral infarction due to occlusion or stenosis of small artery: Secondary | ICD-10-CM | POA: Diagnosis not present

## 2021-04-08 DIAGNOSIS — R059 Cough, unspecified: Secondary | ICD-10-CM

## 2021-04-08 DIAGNOSIS — G9389 Other specified disorders of brain: Secondary | ICD-10-CM | POA: Diagnosis not present

## 2021-04-08 DIAGNOSIS — N319 Neuromuscular dysfunction of bladder, unspecified: Secondary | ICD-10-CM

## 2021-04-08 DIAGNOSIS — Z7982 Long term (current) use of aspirin: Secondary | ICD-10-CM | POA: Diagnosis not present

## 2021-04-08 DIAGNOSIS — R451 Restlessness and agitation: Secondary | ICD-10-CM | POA: Diagnosis not present

## 2021-04-08 DIAGNOSIS — G9340 Encephalopathy, unspecified: Secondary | ICD-10-CM | POA: Diagnosis not present

## 2021-04-08 DIAGNOSIS — R7989 Other specified abnormal findings of blood chemistry: Secondary | ICD-10-CM | POA: Diagnosis not present

## 2021-04-08 DIAGNOSIS — I639 Cerebral infarction, unspecified: Secondary | ICD-10-CM | POA: Diagnosis present

## 2021-04-08 DIAGNOSIS — Z743 Need for continuous supervision: Secondary | ICD-10-CM | POA: Diagnosis not present

## 2021-04-08 DIAGNOSIS — F32A Depression, unspecified: Secondary | ICD-10-CM | POA: Diagnosis not present

## 2021-04-08 DIAGNOSIS — R531 Weakness: Secondary | ICD-10-CM | POA: Diagnosis not present

## 2021-04-08 DIAGNOSIS — N3289 Other specified disorders of bladder: Secondary | ICD-10-CM | POA: Diagnosis not present

## 2021-04-08 DIAGNOSIS — G40209 Localization-related (focal) (partial) symptomatic epilepsy and epileptic syndromes with complex partial seizures, not intractable, without status epilepticus: Secondary | ICD-10-CM | POA: Diagnosis not present

## 2021-04-08 DIAGNOSIS — G8220 Paraplegia, unspecified: Secondary | ICD-10-CM | POA: Diagnosis not present

## 2021-04-08 DIAGNOSIS — D72829 Elevated white blood cell count, unspecified: Secondary | ICD-10-CM | POA: Diagnosis not present

## 2021-04-08 DIAGNOSIS — R29810 Facial weakness: Secondary | ICD-10-CM | POA: Diagnosis not present

## 2021-04-08 DIAGNOSIS — R251 Tremor, unspecified: Secondary | ICD-10-CM | POA: Diagnosis not present

## 2021-04-08 DIAGNOSIS — Z8673 Personal history of transient ischemic attack (TIA), and cerebral infarction without residual deficits: Secondary | ICD-10-CM | POA: Diagnosis not present

## 2021-04-08 DIAGNOSIS — R1312 Dysphagia, oropharyngeal phase: Secondary | ICD-10-CM | POA: Diagnosis not present

## 2021-04-08 DIAGNOSIS — R4701 Aphasia: Secondary | ICD-10-CM | POA: Diagnosis not present

## 2021-04-08 LAB — CBC WITH DIFFERENTIAL/PLATELET
Abs Immature Granulocytes: 0.01 10*3/uL (ref 0.00–0.07)
Basophils Absolute: 0 10*3/uL (ref 0.0–0.1)
Basophils Relative: 1 %
Eosinophils Absolute: 0.3 10*3/uL (ref 0.0–0.5)
Eosinophils Relative: 4 %
HCT: 45.6 % (ref 39.0–52.0)
Hemoglobin: 15.4 g/dL (ref 13.0–17.0)
Immature Granulocytes: 0 %
Lymphocytes Relative: 24 %
Lymphs Abs: 1.6 10*3/uL (ref 0.7–4.0)
MCH: 31.8 pg (ref 26.0–34.0)
MCHC: 33.8 g/dL (ref 30.0–36.0)
MCV: 94 fL (ref 80.0–100.0)
Monocytes Absolute: 0.8 10*3/uL (ref 0.1–1.0)
Monocytes Relative: 12 %
Neutro Abs: 3.9 10*3/uL (ref 1.7–7.7)
Neutrophils Relative %: 59 %
Platelets: 279 10*3/uL (ref 150–400)
RBC: 4.85 MIL/uL (ref 4.22–5.81)
RDW: 11.8 % (ref 11.5–15.5)
WBC: 6.6 10*3/uL (ref 4.0–10.5)
nRBC: 0 % (ref 0.0–0.2)

## 2021-04-08 LAB — URINALYSIS, ROUTINE W REFLEX MICROSCOPIC
Bilirubin Urine: NEGATIVE
Glucose, UA: NEGATIVE mg/dL
Hgb urine dipstick: NEGATIVE
Ketones, ur: NEGATIVE mg/dL
Leukocytes,Ua: NEGATIVE
Nitrite: POSITIVE — AB
Protein, ur: NEGATIVE mg/dL
Specific Gravity, Urine: 1.018 (ref 1.005–1.030)
pH: 6 (ref 5.0–8.0)

## 2021-04-08 LAB — RESP PANEL BY RT-PCR (FLU A&B, COVID) ARPGX2
Influenza A by PCR: NEGATIVE
Influenza B by PCR: NEGATIVE
SARS Coronavirus 2 by RT PCR: NEGATIVE

## 2021-04-08 LAB — ECHOCARDIOGRAM COMPLETE
Area-P 1/2: 4.89 cm2
S' Lateral: 3 cm

## 2021-04-08 LAB — COMPREHENSIVE METABOLIC PANEL
ALT: 23 U/L (ref 0–44)
AST: 22 U/L (ref 15–41)
Albumin: 3.9 g/dL (ref 3.5–5.0)
Alkaline Phosphatase: 70 U/L (ref 38–126)
Anion gap: 10 (ref 5–15)
BUN: 17 mg/dL (ref 6–20)
CO2: 24 mmol/L (ref 22–32)
Calcium: 9.3 mg/dL (ref 8.9–10.3)
Chloride: 103 mmol/L (ref 98–111)
Creatinine, Ser: 1.08 mg/dL (ref 0.61–1.24)
GFR, Estimated: >60 mL/min (ref >60)
Glucose, Bld: 97 mg/dL (ref 70–99)
Potassium: 4.1 mmol/L (ref 3.5–5.1)
Sodium: 137 mmol/L (ref 135–145)
Total Bilirubin: 1 mg/dL (ref 0.3–1.2)
Total Protein: 5.9 g/dL — ABNORMAL LOW (ref 6.5–8.1)

## 2021-04-08 LAB — CBG MONITORING, ED: Glucose-Capillary: 96 mg/dL (ref 70–99)

## 2021-04-08 MED ORDER — FAMOTIDINE 20 MG PO TABS
20.0000 mg | ORAL_TABLET | Freq: Every day | ORAL | Status: DC
  Administered 2021-04-09 – 2021-04-14 (×6): 20 mg via ORAL
  Filled 2021-04-08 (×6): qty 1

## 2021-04-08 MED ORDER — POLYETHYLENE GLYCOL 3350 17 G PO PACK
17.0000 g | PACK | Freq: Every day | ORAL | Status: DC
  Administered 2021-04-08 – 2021-04-14 (×6): 17 g via ORAL
  Filled 2021-04-08 (×7): qty 1

## 2021-04-08 MED ORDER — SODIUM CHLORIDE 0.9 % IV SOLN
1.0000 g | INTRAVENOUS | Status: DC
  Administered 2021-04-08 – 2021-04-09 (×2): 1 g via INTRAVENOUS
  Filled 2021-04-08 (×2): qty 10

## 2021-04-08 MED ORDER — KETOTIFEN FUMARATE 0.025 % OP SOLN
1.0000 [drp] | Freq: Two times a day (BID) | OPHTHALMIC | Status: DC | PRN
  Administered 2021-04-08 – 2021-04-09 (×2): 1 [drp] via OPHTHALMIC
  Filled 2021-04-08: qty 5

## 2021-04-08 MED ORDER — DOCUSATE SODIUM 100 MG PO CAPS
200.0000 mg | ORAL_CAPSULE | Freq: Two times a day (BID) | ORAL | Status: DC
  Administered 2021-04-08 – 2021-04-14 (×11): 200 mg via ORAL
  Filled 2021-04-08 (×12): qty 2

## 2021-04-08 MED ORDER — BACLOFEN 5 MG HALF TABLET
5.0000 mg | ORAL_TABLET | Freq: Three times a day (TID) | ORAL | Status: DC
  Administered 2021-04-08 – 2021-04-14 (×17): 5 mg via ORAL
  Filled 2021-04-08 (×20): qty 1

## 2021-04-08 MED ORDER — ENOXAPARIN SODIUM 40 MG/0.4ML IJ SOSY
40.0000 mg | PREFILLED_SYRINGE | Freq: Every day | INTRAMUSCULAR | Status: DC
  Administered 2021-04-08 – 2021-04-14 (×7): 40 mg via SUBCUTANEOUS
  Filled 2021-04-08 (×7): qty 0.4

## 2021-04-08 MED ORDER — ESCITALOPRAM OXALATE 10 MG PO TABS
10.0000 mg | ORAL_TABLET | Freq: Every day | ORAL | Status: DC
  Administered 2021-04-08 – 2021-04-14 (×7): 10 mg via ORAL
  Filled 2021-04-08 (×7): qty 1

## 2021-04-08 MED ORDER — CLOPIDOGREL BISULFATE 75 MG PO TABS
75.0000 mg | ORAL_TABLET | Freq: Every day | ORAL | Status: DC
  Administered 2021-04-08 – 2021-04-14 (×7): 75 mg via ORAL
  Filled 2021-04-08 (×7): qty 1

## 2021-04-08 MED ORDER — WHITE PETROLATUM EX OINT
TOPICAL_OINTMENT | CUTANEOUS | Status: AC
  Filled 2021-04-08: qty 28.35

## 2021-04-08 MED ORDER — LEVOTHYROXINE SODIUM 112 MCG PO TABS
112.0000 ug | ORAL_TABLET | Freq: Every day | ORAL | Status: DC
  Administered 2021-04-08 – 2021-04-13 (×6): 112 ug via ORAL
  Filled 2021-04-08 (×6): qty 1

## 2021-04-08 MED ORDER — ADULT MULTIVITAMIN W/MINERALS CH
1.0000 | ORAL_TABLET | Freq: Every day | ORAL | Status: DC
  Administered 2021-04-09 – 2021-04-14 (×6): 1 via ORAL
  Filled 2021-04-08 (×6): qty 1

## 2021-04-08 MED ORDER — ACETAMINOPHEN 325 MG PO TABS
650.0000 mg | ORAL_TABLET | Freq: Four times a day (QID) | ORAL | Status: DC | PRN
  Administered 2021-04-11 – 2021-04-13 (×2): 650 mg via ORAL
  Filled 2021-04-08 (×2): qty 2

## 2021-04-08 MED ORDER — ATORVASTATIN CALCIUM 40 MG PO TABS
40.0000 mg | ORAL_TABLET | Freq: Every day | ORAL | Status: DC
  Administered 2021-04-08 – 2021-04-14 (×7): 40 mg via ORAL
  Filled 2021-04-08 (×7): qty 1

## 2021-04-08 MED ORDER — ASPIRIN EC 81 MG PO TBEC
81.0000 mg | DELAYED_RELEASE_TABLET | Freq: Every day | ORAL | Status: DC
  Administered 2021-04-08 – 2021-04-14 (×7): 81 mg via ORAL
  Filled 2021-04-08 (×7): qty 1

## 2021-04-08 MED ORDER — LAMOTRIGINE 100 MG PO TABS
300.0000 mg | ORAL_TABLET | Freq: Every evening | ORAL | Status: DC
  Administered 2021-04-08 – 2021-04-13 (×6): 300 mg via ORAL
  Filled 2021-04-08 (×7): qty 3

## 2021-04-08 MED ORDER — LAMOTRIGINE 150 MG PO TABS
225.0000 mg | ORAL_TABLET | Freq: Every morning | ORAL | Status: DC
  Administered 2021-04-09 – 2021-04-14 (×6): 225 mg via ORAL
  Filled 2021-04-08 (×6): qty 2

## 2021-04-08 MED ORDER — MELATONIN 3 MG PO TABS
6.0000 mg | ORAL_TABLET | Freq: Every day | ORAL | Status: DC
  Administered 2021-04-08 – 2021-04-13 (×6): 6 mg via ORAL
  Filled 2021-04-08 (×6): qty 2

## 2021-04-08 MED ORDER — CALCIUM CARBONATE ANTACID 500 MG PO CHEW
1.0000 | CHEWABLE_TABLET | Freq: Every day | ORAL | Status: DC
  Administered 2021-04-09 – 2021-04-14 (×6): 200 mg via ORAL
  Filled 2021-04-08 (×6): qty 1

## 2021-04-08 MED ORDER — LACTATED RINGERS IV SOLN: Freq: Once | INTRAVENOUS | Status: AC

## 2021-04-08 MED ORDER — STROKE: EARLY STAGES OF RECOVERY BOOK
Freq: Once | Status: AC
  Filled 2021-04-08: qty 1

## 2021-04-08 NOTE — ED Provider Notes (Signed)
[facility] EMERGENCY DEPARTMENT Provider Note   CSN: [id number] Arrival date & time: 04/08/21  9147     History No chief complaint on file.   Howard White is a 60 y.o. male with PMH/o Brain cancer, seizures, thyroid disease who presents for evaluation of weakness.  Patient with a history of brain cancer that is currently in remission.  He had a stroke in November 2021 which led to him with left-sided deficits.  He follows PT.  He noticed yesterday around 8 AM, he felt like his legs were slightly weaker.  He states he has been falling a lot frequently.  He went to PT and they noted him to be weak as well.  They called his PCP and they advised him to go to the emergency department.  He does report he is on blood thinners though he does not know which one.  He does not know if he hit his head.  He states that he uses a wheelchair most the time and they have been working to get back to a cane and walker but he has been falling.  He otherwise feels fine.  He denies any fevers, vision changes, speech difficulty, chest pain, difficulty breathing, abdominal pain, nausea/vomiting, new numbness.  The history is provided by the patient.       Past Medical History:  Diagnosis Date  . Brain cancer ([place])   . Seizures ([place])   . Thyroid disease    Hypothyroid    Patient Active Problem List   Diagnosis Date Noted  . Acute CVA (cerebrovascular accident) ([place]) 04/08/2021  . Slow transit constipation   . Sleep disturbance   . Basal ganglia stroke ([place])   . Incontinence of feces   . Leukocytosis   . Seizures ([place])   . Spastic hemiparesis ([place])   . Cerebrovascular accident (CVA) of right basal ganglia ([place]) 09/30/2020  . CVA (cerebral vascular accident) ([place]) 09/26/2020  . Gait disorder 10/12/2016  . Memory loss 10/12/2016  . Epilepsy ([place]) 03/30/2013  . Malignant neoplasm of brain ([place]) 03/30/2013  . 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 brain cancer    . BRAIN SURGERY         Family History  Problem Relation Age of Onset  . Brain cancer Other   . Thyroid disease Other   . Cancer Other   . Diabetes Other   . Stroke Other     Social History   Tobacco Use  . Smoking status: Never Smoker  . Smokeless tobacco: Never Used  Substance Use Topics  . Alcohol use: Yes    Alcohol/week: 0.0 standard drinks    Comment: 2 beers a month  . Drug use: No    Home Medications Prior to Admission medications   Medication Sig Start Date End Date Taking? Authorizing Provider  acetaminophen (TYLENOL) 325 MG tablet Take 2 tablets (650 mg total) by mouth every 6 (six) hours as needed for mild pain (or Fever >/= 101). 10/28/20   Angiulli, Lavon Paganini, PA-C  aspirin EC 81 MG EC tablet Take 1 tablet (81 mg total) by mouth daily. Swallow whole. 10/01/20   Darliss Cheney, MD  atorvastatin (LIPITOR) 40 MG tablet Take 40 mg by mouth daily.    [provider]  Baclofen 5 MG TABS Take 5 mg by mouth 3 (three) times daily. 01/08/21   Kirsteins, Luanna Salk, MD  calcium carbonate (TUMS - DOSED IN  MG ELEMENTAL CALCIUM) 500 MG chewable tablet Chew 1 tablet (200 mg of elemental calcium total) by mouth daily. 10/28/20   Angiulli, Lavon Paganini, PA-C  docusate sodium (COLACE) 100 MG capsule Take 2 capsules (200 mg total) by mouth 2 (two) times daily. 10/28/20   Angiulli, Lavon Paganini, PA-C  escitalopram (LEXAPRO) 10 MG tablet Take 1 tablet (10 mg total) by mouth daily. 10/28/20   Angiulli, Lavon Paganini, PA-C  famotidine (PEPCID) 20 MG tablet Take 1 tablet (20 mg total) by mouth daily. 10/28/20   Angiulli, Lavon Paganini, PA-C  ketotifen (ZADITOR) 0.025 % ophthalmic solution Place 1 drop into both eyes 2 (two) times daily as needed (for itchy eyes). 10/28/20   Angiulli, Lavon Paganini, PA-C  lamoTRIgine (LAMICTAL) 150 MG tablet TAKE 1 AND 1/2 TABS EVERY MORNING AND 2 TAB EVERY EVENING Patient taking differently: Take 225-300 mg by mouth See  admin instructions. TAKE 1 AND 1/2 TABS ( 225mg )  EVERY MORNING AND 2 TAB (300mg ) EVERY EVENING 05/06/20   Suzzanne Cloud, NP  melatonin 3 MG TABS tablet Take 2 tablets (6 mg total) by mouth at bedtime. 10/28/20   Angiulli, Lavon Paganini, PA-C  Multiple Vitamins-Minerals (MULTIVITAMIN WITH MINERALS) tablet Take 1 tablet by mouth daily.    [provider]  polyethylene glycol (MIRALAX / GLYCOLAX) 17 g packet Take 17 g by mouth daily. 10/28/20   Angiulli, Lavon Paganini, PA-C  SYNTHROID 112 MCG tablet Take 1 tablet (112 mcg total) by mouth at bedtime. 10/28/20   Angiulli, Lavon Paganini, PA-C    Allergies    Lactose intolerance (gi)  Review of Systems   Review of Systems  Constitutional: Negative for fever.  Respiratory: Negative for cough and shortness of breath.   Cardiovascular: Negative for chest pain.  Gastrointestinal: Negative for abdominal pain, nausea and vomiting.  Genitourinary: Negative for dysuria and hematuria.  Neurological: Positive for weakness. Negative for headaches.  All other systems reviewed and are negative.   Physical Exam Updated Vital Signs BP 119/78   Pulse 73   Temp 97.6 F (36.4 C) (Temporal)   Resp 19   SpO2 96%   Physical Exam Vitals and nursing note reviewed.  Constitutional:      Appearance: Normal appearance. He is well-developed.  HENT:     Head: Normocephalic and atraumatic.  Eyes:     General: Lids are normal.     Conjunctiva/sclera: Conjunctivae normal.     Pupils: Pupils are equal, round, and reactive to light.     Comments: PERRL. EOMs intact. No nystagmus. No neglect.   Cardiovascular:     Rate and Rhythm: Normal rate and regular rhythm.     Pulses: Normal pulses.     Heart sounds: Normal heart sounds. No murmur heard. No friction rub. No gallop.   Pulmonary:     Effort: Pulmonary effort is normal.     Breath sounds: Normal breath sounds.     Comments: Lungs clear to auscultation bilaterally.  Symmetric chest rise.  No wheezing, rales,  rhonchi. Abdominal:     Palpations: Abdomen is soft. Abdomen is not rigid.     Tenderness: There is no abdominal tenderness. There is no guarding.     Comments: Abdomen is soft, non-distended, non-tender. No rigidity, No guarding. No peritoneal signs.  Musculoskeletal:        General: Normal range of motion.     Cervical back: Full passive range of motion without pain.  Skin:    General: Skin is warm and  dry.     Capillary Refill: Capillary refill takes less than 2 seconds.  Neurological:     Mental Status: He is alert and oriented to person, place, and time.     Comments: Cranial nerves III-XII intact Follows commands, Moves all extremities  5/5 strength to RUE and RLE.  Mild spasticity noted to left upper extremity.  Slightly decreased strength noted to both the left upper and left lower extremity which is baseline per patient. Sensation intact throughout all major nerve distributions No slurred speech. No facial droop.   Psychiatric:        Speech: Speech normal.     ED Results / Procedures / Treatments   Labs (all labs ordered are listed, but only abnormal results are displayed) Labs Reviewed  COMPREHENSIVE METABOLIC PANEL - Abnormal; Notable for the following components:      Result Value   Total Protein 5.9 (*)    All other components within normal limits  URINALYSIS, ROUTINE W REFLEX MICROSCOPIC - Abnormal; Notable for the following components:   Nitrite POSITIVE (*)    Bacteria, UA FEW (*)    All other components within normal limits  RESP PANEL BY RT-PCR (FLU A&B, COVID) ARPGX2  URINE CULTURE  CBC WITH DIFFERENTIAL/PLATELET  CBG MONITORING, ED    EKG None  Radiology CT Head Wo Contrast  Result Date: 04/08/2021 CLINICAL DATA:  Weakness.  Falls. EXAM: CT HEAD WITHOUT CONTRAST TECHNIQUE: Contiguous axial images were obtained from the base of the skull through the vertex without intravenous contrast. COMPARISON:  Brain MRI 09/26/2020 FINDINGS: Brain: Cystic density  in the left frontal region correlating with remote history of astrocytoma resection. Confluent chronic small vessel ischemia in the cerebral white matter with perforator infarct at the right basal ganglia on a chronic basis. Suspect accelerated disease from radiotherapy. No acute hemorrhage, hydrocephalus, or worrisome mass effect Vascular: Atheromatous calcification Skull: Diffuse heterogeneity of the skull, likely radiation effects. Unremarkable remote left frontal craniotomy. Sinuses/Orbits: Negative IMPRESSION: 1. No acute finding. 2. Remote left cerebral glioma resection. 3. 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 Electronically Signed   By: Monte Fantasia M.D.   On: 04/08/2021 10:50   MR BRAIN WO CONTRAST  Result Date: 04/08/2021 CLINICAL DATA:  Following EXAM: MRI HEAD WITHOUT CONTRAST TECHNIQUE: Multiplanar, multiecho pulse sequences of the brain and surrounding structures were obtained without intravenous contrast. COMPARISON:  09/26/2020 FINDINGS: Motion artifact is present.  SWI was not obtained. Brain: There is restricted diffusion involving the right corona radiata extending from body of caudate toward the lentiform nucleus. This is anterior to a similarly oriented chronic infarct. There is a CSF intensity resection cavity of the left frontal lobe abutting or communicating with the left lateral ventricle. Ventricles and sulci are stable in size and configuration. No mass effect. Confluent areas of T2 hyperintensity in the supratentorial white matter are nonspecific and may reflect chronic microvascular ischemic changes and/or therapy related changes. Vascular: Major vessel flow voids at the skull base are preserved. Skull and upper cervical spine: Normal marrow signal is grossly preserved. Sinuses/Orbits: Paranasal sinuses are aerated. Orbits are unremarkable. Other: Sella is unremarkable.  Mastoid air cells are clear. IMPRESSION: Motion degraded study. Acute small  vessel infarct involving right corona radiata and adjacent basal ganglia. Chronic findings detailed above. Electronically Signed   By: Macy Mis M.D.   On: 04/08/2021 12:42    Procedures Procedures   Medications Ordered in ED Medications - No data to display  ED Course  I have reviewed the triage vital signs and the nursing notes.  Pertinent labs & imaging results that were available during my care of the patient were reviewed by me and considered in my medical decision making (see chart for details).    MDM Rules/Calculators/A&P                          59 year old male past medical history of stroke with left-sided deficits who presents for evaluation of weakness of his legs.  Reports that this began about 8 AM yesterday. Mom noted him to be weaker and had falling. He had been working with PT/OT to make improvements in ambulation but felt like since yesterday he declined. Walks currently with assistance and has been trying to get back to walking with a cane or a walker.  Called PCP and they advised him to come to the emergency department.  He states he is on blood thinners but does not know which 1.  No other symptoms at this time.  On initial arrival, he is afebrile, nontoxic-appearing.  Vital signs are stable.  On exam, he has spasticity of the left upper extremity as well as some decreased strength noted which he states is baseline.  No other deficits noted.  Patient not a code stroke at this time given symptom duration, reassuring exam.  Plan for labs, CT head given recent falls.  Review of neurology noted shows that on 12/08/20: Shows he was on DAPT for 3 weeks and then transitioned to ASA.   POC CBG is 96. UA is nitrite positive with bacteria. CMP shows normal BUN/Cr. CBC shows no leukocytosis or anemia.   CT head shows no acute findings. He has a remote left cerebral glioma resection.   MRI brain shows acute small vessel infarct involving the right corona radiata and adjacent  basal ganglia.   Discussed patient with Dr. Leonel Ramsay (Neurology). Recommends medicine admission.  Will plan for admission.   Discussed patient with Dr. Rowe Pavy (hospitalist) who accepts patient for admission.   Portions of this note were generated with Lobby[employer]. Dictation errors may occur despite best attempts at proofreading.  Final Clinical Impression(s) / ED Diagnoses Final diagnoses:  Acute CVA (cerebrovascular accident) [facility])  Weakness    Rx / DC Orders ED Discharge Orders    None       Desma Mcgregor 04/08/21 1337    Carmin Muskrat, MD 04/09/21 1542

## 2021-04-08 NOTE — ED Notes (Signed)
Patient transported to MRI 

## 2021-04-08 NOTE — ED Triage Notes (Signed)
Pt here from home with general weakness and falls , pt has left sided weakness from prior stroke , no fevers

## 2021-04-08 NOTE — ED Notes (Signed)
Pt returned from MRI °

## 2021-04-08 NOTE — Progress Notes (Signed)
  Echocardiogram 2D Echocardiogram has been performed.  Howard White 04/08/2021, 3:46 PM

## 2021-04-08 NOTE — H&P (Signed)
History and Physical    Howard White KXF:[id number] DOB: 07/03/1961 DOA: 04/08/2021  PCP: Seward Carol, MD  Chief Complaint: Bilateral lower extremity weakness, falls  HPI: Howard White is a 60 y.o. male with medical history significant with history of astrocytoma with left frontal craniotomy resection 20 years ago, seizure disorder, hypothyroidism, history of CVA in November 2021, spastic bladder, ambulatory dysfunction.  The patient presented to the emergency department due to 2-3 falls while working with physical therapy today and bilateral lower extremity weakness.  Denies hitting his head.  Denies loss of consciousness. He has residual left-sided weakness of the upper and lower extremity since his previous stroke in November 2021. He was started on dual antiplatelet therapy with aspirin and Plavix for 3 weeks and then transitioned to aspirin alone.  He was also started on a high intensity statin therapy as well.  He was discharged to inpatient rehab therapy.  No slurred speech, no swallowing difficulties, no visual deficits, no cognitive deficits from his previous stroke.  Eating a regular diet.  Since previous stroke he was initially wheelchair-bound but with PT/OT has progressed to using a single pronged cane occasionally and able to get up out of the wheelchair to go to the bathroom or sit in a recliner at home.  Has been compliant with taking his medications.  His mother assists with his medications and puts them in a pill pack for him. The patient is asking if he can have something to drink because he is thirsty.  Has not had a formal follow evaluation or nursing swallow evaluation yet.  Neurology midlevel provider at bedside during my exam as well.  In the emergency department Shows relatively normal labs.  T head without contrast showed no acute findings.  MRI was positive for vessel infarct involving the right corona radiata and adjacent basal ganglia.  tPA was not given in the emergency  department.  NIH stroke scale upon my evaluation 2-3.  Review of Systems: As per HPI otherwise 10 point review of systems negative.   Past Medical History:  Diagnosis Date  . Brain cancer ([place])   . Seizures ([place])   . Thyroid disease    Hypothyroid    Past Surgical History:  Procedure Laterality Date  . brain cancer    . BRAIN SURGERY       reports that he has never smoked. He has never used smokeless tobacco. He reports current alcohol use. He reports that he does not use drugs.  Allergies  Allergen Reactions  . Lactose Intolerance (Gi)     Family History  Problem Relation Age of Onset  . Brain cancer Other   . Thyroid disease Other   . Cancer Other   . Diabetes Other   . Stroke Other      Prior to Admission medications   Medication Sig Start Date End Date Taking? Authorizing Provider  acetaminophen (TYLENOL) 325 MG tablet Take 2 tablets (650 mg total) by mouth every 6 (six) hours as needed for mild pain (or Fever >/= 101). 10/28/20   Angiulli, Lavon Paganini, PA-C  aspirin EC 81 MG EC tablet Take 1 tablet (81 mg total) by mouth daily. Swallow whole. 10/01/20   Darliss Cheney, MD  atorvastatin (LIPITOR) 40 MG tablet Take 40 mg by mouth daily.    [provider]  Baclofen 5 MG TABS Take 5 mg by mouth 3 (three) times daily. 01/08/21   Kirsteins, Luanna Salk, MD  calcium carbonate (TUMS - DOSED IN MG ELEMENTAL  CALCIUM) 500 MG chewable tablet Chew 1 tablet (200 mg of elemental calcium total) by mouth daily. 10/28/20   Angiulli, Lavon Paganini, PA-C  docusate sodium (COLACE) 100 MG capsule Take 2 capsules (200 mg total) by mouth 2 (two) times daily. 10/28/20   Angiulli, Lavon Paganini, PA-C  escitalopram (LEXAPRO) 10 MG tablet Take 1 tablet (10 mg total) by mouth daily. 10/28/20   Angiulli, Lavon Paganini, PA-C  famotidine (PEPCID) 20 MG tablet Take 1 tablet (20 mg total) by mouth daily. 10/28/20   Angiulli, Lavon Paganini, PA-C  ketotifen (ZADITOR) 0.025 % ophthalmic solution Place 1 drop into both  eyes 2 (two) times daily as needed (for itchy eyes). 10/28/20   Angiulli, Lavon Paganini, PA-C  lamoTRIgine (LAMICTAL) 150 MG tablet TAKE 1 AND 1/2 TABS EVERY MORNING AND 2 TAB EVERY EVENING Patient taking differently: Take 225-300 mg by mouth See admin instructions. TAKE 1 AND 1/2 TABS ( 225mg )  EVERY MORNING AND 2 TAB (300mg ) EVERY EVENING 05/06/20   Suzzanne Cloud, NP  melatonin 3 MG TABS tablet Take 2 tablets (6 mg total) by mouth at bedtime. 10/28/20   Angiulli, Lavon Paganini, PA-C  Multiple Vitamins-Minerals (MULTIVITAMIN WITH MINERALS) tablet Take 1 tablet by mouth daily.    [provider]  polyethylene glycol (MIRALAX / GLYCOLAX) 17 g packet Take 17 g by mouth daily. 10/28/20   Angiulli, Lavon Paganini, PA-C  SYNTHROID 112 MCG tablet Take 1 tablet (112 mcg total) by mouth at bedtime. 10/28/20   Cathlyn Parsons, PA-C    Physical Exam: Vitals:   04/08/21 0949 04/08/21 1130 04/08/21 1200 04/08/21 1245  BP:  122/79 119/78   Pulse:  71 73   Resp:  18  19  Temp: 97.6 F (36.4 C)     TempSrc: Temporal     SpO2:  94% 96%       Constitutional: NAD, calm, left-sided spasticity noted Vitals:   04/08/21 0949 04/08/21 1130 04/08/21 1200 04/08/21 1245  BP:  122/79 119/78   Pulse:  71 73   Resp:  18  19  Temp: 97.6 F (36.4 C)     TempSrc: Temporal     SpO2:  94% 96%    Eyes: PERRL, lids and conjunctivae normal ENMT: Mucous membranes are moist. Posterior pharynx clear of any exudate or lesions.Normal dentition.  Neck: normal, supple, no masses, no thyromegaly Respiratory: clear to auscultation bilaterally, no wheezing, no crackles. Normal respiratory effort. No accessory muscle use.  Cardiovascular: Regular rate and rhythm, no murmurs / rubs / gallops. No extremity edema. 2+ pedal pulses. No carotid bruits.  Abdomen: no tenderness, no masses palpated. No hepatosplenomegaly. Bowel sounds positive.  Musculoskeletal: no clubbing / cyanosis. No joint deformity upper and lower extremities.  Good ROM, no contractures. Normal muscle tone.  Skin: no rashes, lesions, ulcers. No induration Neurologic: CN 2-12 grossly intact. Sensation intact, DTR normal.  Verified muscle strength in the upper and lower extremity; 5 muscle strength in the left upper and lower extremity.  Slurred speech.  No visual deficits noted. Psychiatric: Normal judgment and insight. Alert and oriented x 3. Normal mood.   Labs on Admission: I have personally reviewed following labs and imaging studies  CBC: Recent Labs  Lab 04/08/21 0934  WBC 6.6  NEUTROABS 3.9  HGB 15.4  HCT 45.6  MCV 94.0  PLT 191   Basic Metabolic Panel: Recent Labs  Lab 04/08/21 0934  NA 137  K 4.1  CL 103  CO2 24  GLUCOSE  97  BUN 17  CREATININE 1.08  CALCIUM 9.3   GFR: CrCl cannot be calculated (Unknown ideal weight.). Liver Function Tests: Recent Labs  Lab 04/08/21 0934  AST 22  ALT 23  ALKPHOS 70  BILITOT 1.0  PROT 5.9*  ALBUMIN 3.9   No results for input(s): LIPASE, AMYLASE in the last 168 hours. No results for input(s): AMMONIA in the last 168 hours. Coagulation Profile: No results for input(s): INR, PROTIME in the last 168 hours. Cardiac Enzymes: No results for input(s): CKTOTAL, CKMB, CKMBINDEX, TROPONINI in the last 168 hours. BNP (last 3 results) No results for input(s): PROBNP in the last 8760 hours. HbA1C: No results for input(s): HGBA1C in the last 72 hours. CBG: Recent Labs  Lab 04/08/21 0952  GLUCAP 96   Lipid Profile: No results for input(s): CHOL, HDL, LDLCALC, TRIG, CHOLHDL, LDLDIRECT in the last 72 hours. Thyroid Function Tests: No results for input(s): TSH, T4TOTAL, FREET4, T3FREE, THYROIDAB in the last 72 hours. Anemia Panel: No results for input(s): VITAMINB12, FOLATE, FERRITIN, TIBC, IRON, RETICCTPCT in the last 72 hours. Urine analysis:    Component Value Date/Time   COLORURINE YELLOW 04/08/2021 [place] 04/08/2021 0934   LABSPEC 1.018 04/08/2021 0934    PHURINE 6.0 04/08/2021 [place] 04/08/2021 0934   HGBUR NEGATIVE 04/08/2021 0934   BILIRUBINUR NEGATIVE 04/08/2021 0934   KETONESUR NEGATIVE 04/08/2021 0934   PROTEINUR NEGATIVE 04/08/2021 0934   NITRITE POSITIVE (A) 04/08/2021 0934   LEUKOCYTESUR NEGATIVE 04/08/2021 0934   Sepsis Labs: @LABRCNTIP (procalcitonin:4,lacticidven:4) )No results found for this or any previous visit (from the past 240 hour(s)).   Radiological Exams on Admission: CT Head Wo Contrast  Result Date: 04/08/2021 CLINICAL DATA:  Weakness.  Falls. EXAM: CT HEAD WITHOUT CONTRAST TECHNIQUE: Contiguous axial images were obtained from the base of the skull through the vertex without intravenous contrast. COMPARISON:  Brain MRI 09/26/2020 FINDINGS: Brain: Cystic density in the left frontal region correlating with remote history of astrocytoma resection. Confluent chronic small vessel ischemia in the cerebral white matter with perforator infarct at the right basal ganglia on a chronic basis. Suspect accelerated disease from radiotherapy. No acute hemorrhage, hydrocephalus, or worrisome mass effect Vascular: Atheromatous calcification Skull: Diffuse heterogeneity of the skull, likely radiation effects. Unremarkable remote left frontal craniotomy. Sinuses/Orbits: Negative IMPRESSION: 1. No acute finding. 2. Remote left cerebral glioma resection. 3. 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 Electronically Signed   By: Monte Fantasia M.D.   On: 04/08/2021 10:50   MR BRAIN WO CONTRAST  Result Date: 04/08/2021 CLINICAL DATA:  Following EXAM: MRI HEAD WITHOUT CONTRAST TECHNIQUE: Multiplanar, multiecho pulse sequences of the brain and surrounding structures were obtained without intravenous contrast. COMPARISON:  09/26/2020 FINDINGS: Motion artifact is present.  SWI was not obtained. Brain: There is restricted diffusion involving the right corona radiata extending from body of caudate toward the  lentiform nucleus. This is anterior to a similarly oriented chronic infarct. There is a CSF intensity resection cavity of the left frontal lobe abutting or communicating with the left lateral ventricle. Ventricles and sulci are stable in size and configuration. No mass effect. Confluent areas of T2 hyperintensity in the supratentorial white matter are nonspecific and may reflect chronic microvascular ischemic changes and/or therapy related changes. Vascular: Major vessel flow voids at the skull base are preserved. Skull and upper cervical spine: Normal marrow signal is grossly preserved. Sinuses/Orbits: Paranasal sinuses are aerated. Orbits are unremarkable. Other: Sella is unremarkable.  Mastoid air cells are clear. IMPRESSION: Motion degraded study. Acute small vessel infarct involving right corona radiata and adjacent basal ganglia. Chronic findings detailed above. Electronically Signed   By: Macy Mis M.D.   On: 04/08/2021 12:42    EKG: Independently reviewed.  Normal sinus rhythm.  Assessment:  Acute CVA, present on admission Complicated urinary tract infection, present on admission History of right basal ganglia CVA with residual left-sided weakness Hypothyroidism History of astrocytoma with prior resection Seizure disorder Ambulatory dysfunction  Plan:  The patient will be admitted to the medical floor with telemetry under inpatient status.  Hemodynamics are stable.  Stroke work-up in the emergency department shows MRI positive for acute small vessel infarct involving the right corona radiata and adjacent basal ganglia.  Neurology has been notified and will be following in consultation.  PT/OT and and ST will be ordered.  NPO until patient passes nursing bedside swallow.  Neurochecks on board. Obtain a CT angiogram of the head and neck and if unable to obtain will obtain a carotid ultrasound.  Obtain echocardiogram.  Defer to neurology for antiplatelet medication management. Obtain labs  including hemoglobin A1c and fasting lipid panel.  Monitor on telemetry for any arrhythmias.  Urinalysis is mildly positive for nitrites. The patient endorses some urinary symptoms.  We will start antibiotics with ceftriaxone 1 g IV daily.  Fall precautions, aspiration precautions and seizure precautions in place.  Resume other home medications.  DVT prophylaxis: Lovenox subcu Code Status: Full code Family Communication: Mother was present at bedside during my evaluation.  Discussed assessment and plan with her as well. Disposition Plan: 24 to 48 hours with inpatient rehab versus skilled nursing facility Consults called: Neurology Admission status: Inpatient med telemetry  High risk  Leslee Home MD [facility] Pager [phone]  If 7PM-7AM, please contact night-coverage [email or web address] Password [facility]  04/08/2021, 2:05 PM

## 2021-04-08 NOTE — ED Notes (Signed)
Pt's O2 dropped to 87% on room air. Pt placed on 1L [place], now satting 95%.

## 2021-04-08 NOTE — Telephone Encounter (Signed)
[name], Absalom Aro (on [place]) called, he's having weakness in his legs, falls. Took him to see PCP yesterday. Would like a call from the nurse.   Advised her to take her son to the ER.

## 2021-04-08 NOTE — Telephone Encounter (Signed)
I called spoke to wife of patient she told me they had EMS take him to the hospital.  Patient did not really want to go but due to 2 falls this morning, collapsing (not able to stand) and lethargy.  They thought it be best.  He did go to primary care doctor yesterday had UA done.  had therapy yesterday (more confused, balance off, flat affect).   She will let us know how he is appreciated call back.

## 2021-04-08 NOTE — Consult Note (Addendum)
Neurology Consultation Reason for Consult: stroke right Basal Ganglia Requesting Physician: Rowe Pavy  CC: bilateral lower extremity weakness.   History is obtained from:chart, patient's mother at bedside.   HPI: Howard White is a 60 y.o. male with history of astrocytoma with left frontal craniotomy resection 20 years ago, seizures,depressionand hypothyroidism, who presented to [facility] ED on 09/26/2020 with left sided weakness. Stroke workup revealed a 1.9cm acute infarct at right corona radiata into the basal ganglia. He was started on DAPT (aspirin and plavix) for 3 weeks and then aspirin alone. He was started on atorvastatin 40mg  daily. He was discharged to inpatient rehab therapy, and with the use of physical therapy was able to ambulate with a single pronged cane.   Over the past few weeks patient has had multiple episodes of falls, and this morning prior to admission he had 2 falls and was not able to stand. Of note He was seen by PCP yesterday and  UA was done but results are not known.   This morning PT noticed that he had become significantly weaker and asked him to come to ED for further evaluation. Upon approach he complained of significant weakness at both legs. No significant other complaints noted on approach.  MRI brain done showed acute small vessel infarct involving right CR and adjacent basal ganglia.    LKW: day prior to admission 8am tPA given?: No  IA performed?: No  Premorbid modified rankin scale: 4     0 - No symptoms.     1 - No significant disability. Able to carry out all usual activities, despite some symptoms.     2 - Slight disability. Able to look after own affairs without assistance, but unable to carry out all previous activities.     3 - Moderate disability. Requires some help, but able to walk unassisted.     4 - Moderately severe disability. Unable to attend to own bodily needs without assistance, and unable to walk unassisted.     5 - Severe disability.  Requires constant nursing care and attention, bedridden, incontinent.     6 - Dead.     ROS: All other review of systems was negative except as noted in the HPI.      Past Medical History:  Diagnosis Date  . Brain cancer ([place])   . Seizures ([place])   . Thyroid disease    Hypothyroid      Family History  Problem Relation Age of Onset  . Brain cancer Other   . Thyroid disease Other   . Cancer Other   . Diabetes Other   . Stroke Other       Social History:  reports that he has never smoked. He has never used smokeless tobacco. He reports current alcohol use. He reports that he does not use drugs. Non-smoker   Exam: Current vital signs: BP 119/78   Pulse 73   Temp 97.6 F (36.4 C) (Temporal)   Resp 19   SpO2 96%  Vital signs in last 24 hours: Temp:  [97.6 F (36.4 C)] 97.6 F (36.4 C) (05/25 0949) Pulse Rate:  [63-78] 73 (05/25 1200) Resp:  [12-19] 19 (05/25 1245) BP: (110-131)/(70-85) 119/78 (05/25 1200) SpO2:  [90 %-96 %] 96 % (05/25 1200)   Physical Exam  Constitutional: Appears well-developed and well-nourished, left sided spasticity noted, arm>leg.  Psych: Affect appropriate to situation Eyes: No scleral injection HENT: No oropharyngeal obstruction.  MSK: no joint deformities.  Cardiovascular: Normal rate and regular rhythm.  Respiratory: Effort normal, non-labored breathing GI: Soft.  No distension. There is no tenderness.  Skin: Warm dry and intact visible skin  Neuro: Mental Status: Patient is awake, alert, oriented to person, place, month, year, and situation.  Patient is able to give a clear and coherent history.  No signs of aphasia or neglect  Cranial Nerves: II: Visual Fields are full. Pupils are equal, round, and reactive to light.    III,IV, VI: EOMI without ptosis or diploplia.  V: Facial sensation is symmetric to temperature VII: Facial movement is symmetric.  VIII: hearing is intact to voice X: Uvula elevates symmetrically XI: Shoulder  shrug is symmetric. XII: tongue is midline without atrophy or fasciculations.   Motor: Tone is normal. Bulk is normal. 5/5 strength was present at right side. Left arm spastic from prior strokes btu he does have strong voluntary grip. Left leg strength appears 4+/5 with increased tone.   Sensory: Sensation is symmetric to light touch and temperature in the arms and legs.   Deep Tendon Reflexes: 2+ and symmetric in the biceps and patellae.    toes downgoing.  Cerebellar: FNF and HKS are intact bilaterally     I have reviewed labs in epic and the results pertinent to this consultation are:  Results for JALIEN, WEAKLAND (MRN [id number]) as of 04/08/2021 14:02  Ref. Range 04/08/2021 09:34  Sodium Latest Ref Range: 135 - 145 mmol/L 137  Potassium Latest Ref Range: 3.5 - 5.1 mmol/L 4.1  Chloride Latest Ref Range: 98 - 111 mmol/L 103  CO2 Latest Ref Range: 22 - 32 mmol/L 24  Glucose Latest Ref Range: 70 - 99 mg/dL 97  BUN Latest Ref Range: 6 - 20 mg/dL 17  Creatinine Latest Ref Range: 0.61 - 1.24 mg/dL 1.08  Calcium Latest Ref Range: 8.9 - 10.3 mg/dL 9.3  Anion gap Latest Ref Range: 5 - 15  10  Alkaline Phosphatase Latest Ref Range: 38 - 126 U/L 70  Albumin Latest Ref Range: 3.5 - 5.0 g/dL 3.9  AST Latest Ref Range: 15 - 41 U/L 22  ALT Latest Ref Range: 0 - 44 U/L 23  Total Protein Latest Ref Range: 6.5 - 8.1 g/dL 5.9 (L)  Total Bilirubin Latest Ref Range: 0.3 - 1.2 mg/dL 1.0  GFR, Estimated Latest Ref Range: >60 mL/min >60   Results for KANYON, SEIBOLD (MRN [id number]) as of 04/08/2021 14:02  Ref. Range 04/08/2021 09:34  Appearance Latest Ref Range: CLEAR  CLEAR  Bilirubin Urine Latest Ref Range: NEGATIVE  NEGATIVE  Color, Urine Latest Ref Range: YELLOW  YELLOW  Glucose, UA Latest Ref Range: NEGATIVE mg/dL NEGATIVE  Hgb urine dipstick Latest Ref Range: NEGATIVE  NEGATIVE  Ketones, ur Latest Ref Range: NEGATIVE mg/dL NEGATIVE  Leukocytes,Ua Latest Ref Range: NEGATIVE  NEGATIVE  Nitrite  Latest Ref Range: NEGATIVE  POSITIVE (A)  pH Latest Ref Range: 5.0 - 8.0  6.0  Protein Latest Ref Range: NEGATIVE mg/dL NEGATIVE  Specific Gravity, Urine Latest Ref Range: 1.005 - 1.030  1.018  Bacteria, UA Latest Ref Range: NONE SEEN  FEW (A)  Mucus Unknown PRESENT  RBC / HPF Latest Ref Range: 0 - 5 RBC/hpf 0-5  WBC, UA Latest Ref Range: 0 - 5 WBC/hpf 0-5   I have reviewed the images obtained:  MRI brain 04/08/21  Acute small vessel infarct involving right corona radiata and  adjacent basal ganglia.  Impression: 60 year old white male with recent history of right basal ganglia stroke in 09/2020 now presents with  stroke right BG/rt CR in setting of recent DAPT and now on aspirin 81mg . Pt mother reports compliance.  - Patient with astrocytoma with history of prior resection.   Recommendations: -  Hgba1c, fasting lipid panel - PT consult, OT consult, speech consult - Echocardiogram - MRA head and neck . - aspirin 81mg  daily -plavix 75mg  daily x 3 weeks then continue only with aspirin, please. -risk factor modification-telemetry monitoring -frequent neuro checks -IVF until patient is eating well - permissive HTN x 24 hours. Then slowly allow patient to become normotensive with use of home meds as needed. - we will continue to follow.    Rhina Brackett.   I have seen the patient and reviewed the above note. He has a subcortical stroke on the right. He is beign admitted for secondary risk factor stratification and therapy evaluation.   Roland Rack, MD [facility] [phone]  If 7pm- 7am, please page neurology on call as listed in [place].

## 2021-04-09 ENCOUNTER — Encounter (HOSPITAL_COMMUNITY): Payer: PPO

## 2021-04-09 ENCOUNTER — Ambulatory Visit: Payer: PPO | Admitting: Physical Therapy

## 2021-04-09 ENCOUNTER — Other Ambulatory Visit (HOSPITAL_COMMUNITY): Payer: PPO

## 2021-04-09 ENCOUNTER — Inpatient Hospital Stay (HOSPITAL_COMMUNITY): Payer: PPO

## 2021-04-09 DIAGNOSIS — N390 Urinary tract infection, site not specified: Secondary | ICD-10-CM

## 2021-04-09 DIAGNOSIS — C719 Malignant neoplasm of brain, unspecified: Secondary | ICD-10-CM

## 2021-04-09 DIAGNOSIS — I639 Cerebral infarction, unspecified: Secondary | ICD-10-CM

## 2021-04-09 LAB — COMPREHENSIVE METABOLIC PANEL
ALT: 21 U/L (ref 0–44)
AST: 23 U/L (ref 15–41)
Albumin: 3.7 g/dL (ref 3.5–5.0)
Alkaline Phosphatase: 70 U/L (ref 38–126)
Anion gap: 8 (ref 5–15)
BUN: 13 mg/dL (ref 6–20)
CO2: 24 mmol/L (ref 22–32)
Calcium: 9.1 mg/dL (ref 8.9–10.3)
Chloride: 102 mmol/L (ref 98–111)
Creatinine, Ser: 1.16 mg/dL (ref 0.61–1.24)
GFR, Estimated: >60 mL/min (ref >60)
Glucose, Bld: 121 mg/dL — ABNORMAL HIGH (ref 70–99)
Potassium: 3.8 mmol/L (ref 3.5–5.1)
Sodium: 134 mmol/L — ABNORMAL LOW (ref 135–145)
Total Bilirubin: 0.9 mg/dL (ref 0.3–1.2)
Total Protein: 5.9 g/dL — ABNORMAL LOW (ref 6.5–8.1)

## 2021-04-09 LAB — CBC
HCT: 44.1 % (ref 39.0–52.0)
Hemoglobin: 15.3 g/dL (ref 13.0–17.0)
MCH: 31.7 pg (ref 26.0–34.0)
MCHC: 34.7 g/dL (ref 30.0–36.0)
MCV: 91.3 fL (ref 80.0–100.0)
Platelets: 264 10*3/uL (ref 150–400)
RBC: 4.83 MIL/uL (ref 4.22–5.81)
RDW: 11.7 % (ref 11.5–15.5)
WBC: 8.6 10*3/uL (ref 4.0–10.5)
nRBC: 0 % (ref 0.0–0.2)

## 2021-04-09 LAB — LIPID PANEL
Cholesterol: 101 mg/dL (ref 0–200)
HDL: 48 mg/dL (ref >40)
LDL Cholesterol: 43 mg/dL (ref 0–99)
Total CHOL/HDL Ratio: 2.1 RATIO
Triglycerides: 50 mg/dL (ref <150)
VLDL: 10 mg/dL (ref 0–40)

## 2021-04-09 LAB — HEMOGLOBIN A1C
Hgb A1c MFr Bld: 5.5 % (ref 4.8–5.6)
Mean Plasma Glucose: 111 mg/dL

## 2021-04-09 LAB — TSH: TSH: 4.098 u[IU]/mL (ref 0.350–4.500)

## 2021-04-09 MED ORDER — LORAZEPAM 2 MG/ML IJ SOLN
1.0000 mg | Freq: Once | INTRAMUSCULAR | Status: AC
  Administered 2021-04-09: 1 mg via INTRAVENOUS
  Filled 2021-04-09: qty 1

## 2021-04-09 NOTE — Progress Notes (Signed)
Carotid duplex bilateral and TCD studies completed.   Please see CV Proc for preliminary results.   Darlin Coco, RDMS, RVT

## 2021-04-09 NOTE — Progress Notes (Signed)
STROKE TEAM PROGRESS NOTE   INTERVAL HISTORY Mother at bedside.  60 y.o.malewith history of astrocytoma with left frontal craniotomy resection 20 years ago, seizures,depressionand hypothyroidism,right BG/CR stroke due to small vessel disease on 09/26/2020 presents now with multiple falls over the past few weeks.  He was working with physical therapy and they noticed he had become significantly weaker and asked him to come to ED for further evaluation.  MRI brain showed acute small vessel infarct involving right CR and adjacent basal ganglia.  This morning the patient was sleepier, less interactive per his mother.  She was concerned for seizures because he had these same symptoms with prior seizures.  We will obtain a routine EEG.  BP low this am.  He received a fluid bolus with appropriate response.    Vitals:   04/08/21 2336 04/09/21 0322 04/09/21 0736 04/09/21 1146  BP: (!) 112/59 (!) 92/56 112/78 123/77  Pulse: 68 66 71 73  Resp: 18 18 16 16   Temp: 98 F (36.7 C) 97.8 F (36.6 C) 98.3 F (36.8 C) 98.3 F (36.8 C)  TempSrc: Oral Oral Oral Oral  SpO2: 92% 95% 94% 93%   CBC:  Recent Labs  Lab 04/08/21 0934 04/09/21 0053  WBC 6.6 8.6  NEUTROABS 3.9  --   HGB 15.4 15.3  HCT 45.6 44.1  MCV 94.0 91.3  PLT 279 540   Basic Metabolic Panel:  Recent Labs  Lab 04/08/21 0934 04/09/21 0053  NA 137 134*  K 4.1 3.8  CL 103 102  CO2 24 24  GLUCOSE 97 121*  BUN 17 13  CREATININE 1.08 1.16  CALCIUM 9.3 9.1   Lipid Panel:  Recent Labs  Lab 04/09/21 0053  CHOL 101  TRIG 50  HDL 48  CHOLHDL 2.1  VLDL 10  LDLCALC 43   HgbA1c: No results for input(s): HGBA1C in the last 168 hours. Urine Drug Screen: No results for input(s): LABOPIA, COCAINSCRNUR, LABBENZ, AMPHETMU, THCU, LABBARB in the last 168 hours.  Alcohol Level No results for input(s): ETH in the last 168 hours.  IMAGING past 24 hours CT Head Wo Contrast Result Date: 04/08/2021 IMPRESSION:  1. No acute finding.   2. Remote left cerebral glioma resection. 3. 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   MR BRAIN WO CONTRAST Result Date: 04/08/2021  IMPRESSION: Motion degraded study. Acute small vessel infarct involving right corona radiata and adjacent basal ganglia.   ECHOCARDIOGRAM COMPLETE Result Date: 04/08/2021 IMPRESSIONS   1. Left ventricular ejection fraction, by estimation, is 60 to 65%. The left ventricle has normal function. The left ventricle has no regional wall motion abnormalities. Left ventricular diastolic parameters are consistent with Grade I diastolic dysfunction (impaired relaxation).   2. Right ventricular systolic function is normal. The right ventricular size is normal.   3. The mitral valve is normal in structure. Trivial mitral valve regurgitation. No evidence of mitral stenosis.   4. The aortic valve is normal in structure. Aortic valve regurgitation is not visualized. No aortic stenosis is present.   5. The inferior vena cava is normal in size with greater than 50% respiratory variability, suggesting right atrial pressure of 3 mmHg.   PHYSICAL EXAM Constitutional: Appears well-developed and well-nourished, left sided spasticity noted, arm>leg.  Psych: Affect appropriate to situation Eyes: No scleral injection HENT: No oropharyngeal obstruction.  MSK: no joint deformities.  Cardiovascular: Normal rate and regular rhythm.  Respiratory: Effort normal, non-labored breathing GI: Soft.  No distension. There is no tenderness.  Skin: Warm dry and intact visible skin  Neuro: Mental Status: Patient is awake, alert, oriented to person, place, month, year, and situation.  Patient is able to give a clear and coherent history.  No signs of aphasia or neglect  Cranial Nerves: II: Visual Fields are full. Pupils are equal, round, and reactive to light.    III,IV, VI: EOMI without ptosis or diploplia.  V: Facial sensation is symmetric to  temperature VII: Facial movement is symmetric.  VIII: hearing is intact to voice X: Uvula elevates symmetrically XI: Shoulder shrug is symmetric. XII: tongue is midline without atrophy or fasciculations.   Motor: Tone is normal. Bulk is normal. 5/5 strength was present at right side. Left arm spastic from prior strokes 4/5, hand grip weak 3/5.  Increased tone in the left upper and lower extremity with few beats of clonus.  Left leg strength appears 4+/5 with increased tone.  Sensory: Sensation is symmetric to light touch and temperature in the arms and legs.  Cerebellar: FNF and HKS are intact bilaterally   ASSESSMENT/PLAN [name] is a 60 y.o. 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   Stroke:  Right basal ganglia/corona radiata infarct secondary to small vessel disease source likely.  History of previous right subcortical infarct in November 2021.  Remote history of astrocytoma resection the left frontal region.  CT head No acute finding. Remote left cerebral glioma resection. 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  MRI: acute small vessel infarct involving right corona radiata and adjacent basal ganglia.  MRA  pending  Carotid Doppler: pending  TCDs: pending  2D Echo EF: 63-14%, grade I diastolic dysfunction  SARs Coronavirus: negative  LDL 43  HgbA1c 5.4  VTE prophylaxis - Lovenox  Diet: Regular   Aspirin prior to admission, now on aspirin 81 mg daily and clopidogrel 75 mg daily. Continue DAPT for 3 weeks, and then ASA alone.  Ongoing aggressive stroke risk factor management  Therapy recommendations:  Rehab  Disposition:  pending  Stroke  History  09/26/2020 acute infarct right BG/CR likely due to small vessel disease.  DAPT for 3 weeks and then aspirin alone.  LDL 131 and initiate atorvastatin 40 mg daily.  No history or evidence of HTN or DM.  Other stroke risk factors include EtOH use but no prior stroke history.  Other active problems include history of seizures on Lamictal and astrocytoma s/p craniotomy with radiation 20 years ago.  Residual left hemiparesis and was discharged to CIR for ongoing therapy needs per therapy recommendations.  Seizure History  Mother found him sleeping, confused, not active as usual.  This event resembled seizures he had previously.  Routine EEG to evaluate for seizures  Lamital 225mg  in the morning, 300mg  in the evening  Monitor for seizure activity  Hyperlipidemia  Home meds:  Lipitor 40 mg daily, resumed in hospital  LDL 43, goal < 70  Continue statin at discharge   Other Stroke Risk Factors  Hx stroke: 09/27/2020 right BG/CR likely due to small vessel disease. Left sided residual weakness.   Other Active Problems  Hx of Seizure - Lamictal  Astrocytoma s/p left frontal cranio with radiation 20 years ago  Hypothyroidism: synthroid 112 mcg daily  Anxiety/depression: Lexapro 10mg  daily  Hospital day # 1  Lissy Olivencia-Simmons, ACNP-BC  Stroke NP I have personally obtained history,examined this patient, reviewed notes, independently viewed imaging studies, participated in medical decision making and plan of care.ROS completed by me personally and pertinent positives fully documented  I have made any additions or clarifications directly to the above note. Agree with note above.  He presented with increasing left-sided weakness due to right basal ganglia infarct from small vessel disease.  Recommend aspirin and Plavix for 3 weeks followed by aspirin alone and aggressive risk factor modification.  Continue Lamictal for seizures and check EEG.  Continue ongoing stroke work-up.   Long discussion with patient and mother and answered questions.  Discussed with Dr. Ree Kida.  Greater than 50% time during this 35-minute visit was spent in counseling and coordination of care about lacunar stroke and seizures and answering questions.  Antony Contras, MD Medical Director [place] Pager: [phone] 04/09/2021 3:58 PM  To contact Stroke Continuity provider, please refer to [email or web address] After hours, contact General Neurology

## 2021-04-09 NOTE — Progress Notes (Signed)
Orthopedic Tech Progress Note Patient Details:  Howard White [birth year] [id number] Called order into [facility] Patient ID: [name], male   DOB: 09-06-1961, 60 y.o.   MRN: [id number]   [name] 04/09/2021, 6:47 PM

## 2021-04-09 NOTE — Progress Notes (Signed)
SLP Cancellation Note  Patient Details Name: Howard White MRN: [id number] DOB: 03-24-1961   Cancelled treatment:       Reason Eval/Treat Not Completed: Patient at procedure or test/unavailable (Pt about to be taken for MRI and [place]. SLP will follow up.)  Cintya Daughety I. Hardin Negus, [place], [place] Office number [phone] Pager [place] 04/09/2021, 2:54 PM

## 2021-04-09 NOTE — Progress Notes (Signed)
pts mother calledout and said she felt his symptoms are worse. [facility] is the same as previous shift. MD was paged, family was educated.

## 2021-04-09 NOTE — Progress Notes (Signed)
Rehab Admissions Coordinator Note:  Patient was screened by Cleatrice Burke for appropriateness for an Inpatient Acute Rehab Consult per OT recs.  At this time, we are recommending Inpatient Rehab consult. I will place order per protocol.  Cleatrice Burke RN MSN 04/09/2021, 1:12 PM  I can be reached at [phone].

## 2021-04-09 NOTE — Progress Notes (Signed)
EEG complete - results pending 

## 2021-04-09 NOTE — Progress Notes (Signed)
Ortho notified about need for splint. Ortho ordered from outside vendor. Splint estimated to arrive late tonight or early tomorrow morning.

## 2021-04-09 NOTE — Progress Notes (Signed)
Called regarding some tremor on the right. I examined the patient, he does have some tremor of the right arm, but it abates when at rest, or when performing finger nose finger. No evidence for seizure or other concerning finding. Will continue current management.   Roland Rack, MD [facility] [phone]  If 7pm- 7am, please page neurology on call as listed in [place].

## 2021-04-09 NOTE — Plan of Care (Signed)

## 2021-04-09 NOTE — Evaluation (Signed)
Physical Therapy Evaluation Patient Details Name: Howard White MRN: [id number] DOB: July 03, 1961 Today's Date: 04/09/2021   History of Present Illness  60 yo male presenting to ED due to 2-3 falls and BLE weakness. MRI  acute small vessel infarct involving R corona radiata and basal ganglia. PMH including astrocytoma with left frontal craniotomy resection 20 years ago, seizure disorder, hypothyroidism, history of CVA with [name] in November 2021, spastic bladder, and ambulatory dysfunction.  Clinical Impression  PTA, patient lives with his parents and ambulated with cane with assistance from father. Patient currently requires minA+2 for bed mobility and transfers with HHAx1. Patient ambulated 5' with modA+2 and HHAx1. Patient presents with impaired coordination, impaired cognition, [name], impaired balance, and decreased safety awareness. Patient motivated and has good family support. Patient will benefit from skilled PT services during acute stay to address listed deficits. Recommend CIR following discharge to maximize functional independence and safety.     Follow Up Recommendations CIR    Equipment Recommendations  None recommended by PT    Recommendations for Other Services Rehab consult     Precautions / Restrictions Precautions Precautions: Fall Restrictions Weight Bearing Restrictions: No      Mobility  Bed Mobility Overal bed mobility: Needs Assistance Bed Mobility: Supine to Sit     Supine to sit: Min assist;+2 for safety/equipment;HOB elevated Sit to supine: Mod assist;+2 for physical assistance   General bed mobility comments: significant time and effort. Cues for managing L side. Min A for bringing L hip towards EOB    Transfers Overall transfer level: Needs assistance Equipment used: 1 person hand held assist Transfers: Sit to/from Stand Sit to Stand: Min assist;+2 physical assistance         General transfer comment: Min A +2 for power  up into standing. Blocking of L knee. Use of RUE on therapist's hand for support  Ambulation/Gait Ambulation/Gait assistance: Mod assist;+2 physical assistance;+2 safety/equipment Gait Distance (Feet): 5 Feet Assistive device: 1 person hand held assist Gait Pattern/deviations: Step-to pattern;Decreased step length - right;Decreased step length - left;Decreased stride length;Decreased weight shift to right;Decreased weight shift to left;Shuffle;Leaning posteriorly;Narrow base of support Gait velocity: decreased   General Gait Details: modA+2 for balance, weight shift, guidance. Patient with posterior and L lateral lean during ambulation. Cues required for upright posture and hip extension. Manual facilitation at pelvis for hip extension. Short shuffle steps with difficulty weight shifting. Patient became anxious when approaching sink counter 2-3 feet ahead of him and began reaching for counter requiring modA+2 to maintain balance.  Stairs            Wheelchair Mobility    Modified Rankin (Stroke Patients Only)       Balance Overall balance assessment: Needs assistance Sitting-balance support: No upper extremity supported;Feet supported Sitting balance-Leahy Scale: Fair     Standing balance support: Single extremity supported;During functional activity Standing balance-Leahy Scale: Poor Standing balance comment: Reliant on physical A. Significant posterior lean (increasing with fatigue)                             Pertinent Vitals/Pain Pain Assessment: Faces Faces Pain Scale: No hurt Pain Intervention(s): Monitored during session    Home Living Family/patient expects to be discharged to:: Private residence Living Arrangements: Parent Available Help at Discharge: Family;Available 24 hours/day Type of Home: House Home Access: Level entry     Home Layout: Two level;Able to live on main level with  bedroom/bathroom Home Equipment: Wheelchair - manual;Cane -  single point;Tub bench;Hand held shower head      Prior Function Level of Independence: Needs assistance   Gait / Transfers Assistance Needed: Primarily using w/c. Father light assistance for functional mobility in home with SPC.  ADL's / Homemaking Assistance Needed: Performs dressing and BADLs; father assists with bathing        Hand Dominance   Dominant Hand: Right    Extremity/Trunk Assessment   Upper Extremity Assessment Upper Extremity Assessment: Defer to OT evaluation LUE Deficits / Details: Presenting at brunnstrom level 3 with flexion synergy pattern into a indwelled fist. Pt requiring increased cues "for relaxing" and time to PROM into extension at hand, wrist, and elbow. At times able to perform AROM of digits into extension. LUE Coordination: decreased fine motor;decreased gross motor    Lower Extremity Assessment Lower Extremity Assessment: LLE deficits/detail LLE Deficits / Details: Brunstrom level 3 with extension synergy. Cues for relaxation and PROM into flexion seated EOB. Difficult to assess MMT due to spasticity    Cervical / Trunk Assessment Cervical / Trunk Assessment: Other exceptions Cervical / Trunk Exceptions: Tendency for forwawrd flexion of neck with R head turn  Communication   Communication: No difficulties  Cognition Arousal/Alertness: Awake/alert;Lethargic (sleepy) Behavior During Therapy: WFL for tasks assessed/performed;Impulsive Overall Cognitive Status: Impaired/Different from baseline Area of Impairment: Attention;Memory;Following commands;Safety/judgement;Awareness;Problem solving;Orientation                 Orientation Level: Disoriented to;Time Current Attention Level: Sustained Memory: Decreased short-term memory Following Commands: Follows one step commands with increased time;Follows one step commands inconsistently Safety/Judgement: Decreased awareness of safety;Decreased awareness of deficits Awareness:  Intellectual Problem Solving: Slow processing;Difficulty sequencing;Requires verbal cues General Comments: Very motivated despite fatigue. Pt with decreased attention, problem solving, sequencing, and awareness. Requiring increased time and cues throughout. Once fatigued, pt having decreased coorindaton and requiring Max cues for safety to sit at [place] comments (skin integrity, edema, etc.): Mother present at 32 and end of session    Exercises     Assessment/Plan    PT Assessment Patient needs continued PT services  PT Problem List Decreased strength;Decreased range of motion;Decreased activity tolerance;Decreased balance;Decreased mobility;Decreased coordination;Decreased safety awareness       PT Treatment Interventions DME instruction;Gait training;Functional mobility training;Therapeutic activities;Therapeutic exercise;Balance training;Neuromuscular re-education;Patient/family education    PT Goals (Current goals can be found in the Care Plan section)  Acute Rehab PT Goals Patient Stated Goal: Get stronger PT Goal Formulation: With patient/family Time For Goal Achievement: 04/23/21 Potential to Achieve Goals: Good    Frequency Min 4X/week   Barriers to discharge        Co-evaluation               AM-PAC PT "6 Clicks" Mobility  Outcome Measure Help needed turning from your back to your side while in a flat bed without using bedrails?: Total Help needed moving from lying on your back to sitting on the side of a flat bed without using bedrails?: Total Help needed moving to and from a bed to a chair (including a wheelchair)?: Total Help needed standing up from a chair using your arms (e.g., wheelchair or bedside chair)?: Total Help needed to walk in hospital room?: Total Help needed climbing 3-5 steps with a railing? : Total 6 Click Score: 6    End of Session Equipment Utilized During Treatment: Gait belt Activity Tolerance:  Patient tolerated treatment well  Patient left: in chair;with call bell/phone within reach;with chair alarm set Nurse Communication: Mobility status PT Visit Diagnosis: Unsteadiness on feet (R26.81);Muscle weakness (generalized) (M62.81);Repeated falls (R29.6);Other abnormalities of gait and mobility (R26.89);Other symptoms and signs involving the nervous system (R29.898);Ataxic gait (R26.0);Hemiplegia and hemiparesis Hemiplegia - Right/Left: Left Hemiplegia - dominant/non-dominant: Non-dominant Hemiplegia - caused by: Cerebral infarction    Time: 3154-0086 PT Time Calculation (min) (ACUTE ONLY): 26 min   Charges:   PT Evaluation $PT Eval Moderate Complexity: 1 Mod PT Treatments $Therapeutic Activity: 8-22 mins        Mystic Labo A. Gilford Rile PT, DPT Acute Rehabilitation Services Pager [phone] Office [phone]   Linna Hoff 04/09/2021, 2:59 PM

## 2021-04-09 NOTE — Progress Notes (Signed)
Occupational Therapy Treatment Patient Details Name: Howard White No MRN: [id number] DOB: 1960/11/27 Today's Date: 04/09/2021    History of present illness 60 yo male presenting to ED due to 2-3 falls and BLE weakness. MRI  acute small vessel infarct involving R corona radiata and basal ganglia. PMH including astrocytoma with left frontal craniotomy resection 20 years ago, seizure disorder, hypothyroidism, history of CVA with [name] in November 2021, spastic bladder, and ambulatory dysfunction.   OT comments  Pt progressing towards established OT goals. RN request OT assistance for return to bed. Pt requiring Min A +2 for power up into standing with use of stedy; tactile and verbal cues for weight shift hips forward to place down stedy pads. Pt continues to present with decreased coordination of L side, cognition, and safety. Continue to highly recommend dc to CIR and will continue to follow acutely as admitted.    Follow Up Recommendations  CIR    Equipment Recommendations  Other (comment) (defer to next venue)    Recommendations for Other Services PT consult    Precautions / Restrictions Precautions Precautions: Fall       Mobility Bed Mobility Overal bed mobility: Needs Assistance Bed Mobility: Sit to Supine     Supine to sit: Min assist;+2 for safety/equipment;HOB elevated Sit to supine: Mod assist;+2 for physical assistance   General bed mobility comments: Mod A for managing trunk and BLEs    Transfers Overall transfer level: Needs assistance Equipment used: Ambulation equipment used Transfers: Sit to/from Stand Sit to Stand: Min assist;+2 physical assistance         General transfer comment: Min A +2 for power up into standing. assistance for propeling hips forward and positioning stedy    Balance Overall balance assessment: Needs assistance Sitting-balance support: No upper extremity supported;Feet supported Sitting balance-Leahy Scale: Fair      Standing balance support: During functional activity;Bilateral upper extremity supported Standing balance-Leahy Scale: Poor Standing balance comment: Reliant on physical A. Significant posterior lean (increasing with fatigue)                           ADL either performed or assessed with clinical judgement   ADL Overall ADL's : Needs assistance/impaired Eating/Feeding: Moderate assistance;Sitting   Grooming: Moderate assistance;Sitting;Min guard Grooming Details (indicate cue type and reason): Mod A for bilateral coorindation. Min A for managing wash clothe Upper Body Bathing: Moderate assistance;Sitting   Lower Body Bathing: Maximal assistance;+2 for physical assistance;Sit to/from stand   Upper Body Dressing : Moderate assistance;Sitting   Lower Body Dressing: Maximal assistance;+2 for physical assistance;Sit to/from stand   Toilet Transfer: Cueing for sequencing;Minimal assistance;+2 for physical assistance (sit<>stand with stedy) Toilet Transfer Details (indicate cue type and reason): Min A +2 for power up into standing with use of stedy. Continues to persent with LLE extension and LUE flexion patterns         Functional mobility during ADLs: Minimal assistance;+2 for physical assistance;Total assistance General ADL Comments: RN requesting assistance for return to bed. Use of stedy for return to bed and then placing on bedpan. Requiring Max step by step cues     Vision Baseline Vision/History: Wears glasses Wears Glasses: At all times (prisms) Patient Visual Report: No change from baseline Additional Comments: Need to further assess   Perception     Praxis      Cognition Arousal/Alertness: Awake/alert;Lethargic (yawning and sleepy) Behavior During Therapy: [facility] for tasks assessed/performed;Impulsive Overall Cognitive Status: Impaired/Different from  baseline Area of Impairment: Attention;Memory;Following commands;Safety/judgement;Awareness;Problem  solving;Orientation                 Orientation Level: Disoriented to;Time (stating it is 2020) Current Attention Level: Sustained Memory: Decreased short-term memory Following Commands: Follows one step commands with increased time;Follows one step commands inconsistently Safety/Judgement: Decreased awareness of safety;Decreased awareness of deficits Awareness: Intellectual Problem Solving: Slow processing;Difficulty sequencing;Requires verbal cues General Comments: Continues to present with decreased sequencing, attention, and awareness. Pt requiring step by step cues for return to bed.        Exercises     Shoulder Instructions       General Comments Rn and NT present    Pertinent Vitals/ Pain       Pain Assessment: Faces Faces Pain Scale: No hurt Pain Intervention(s): Monitored during session;Limited activity within patient's tolerance;Repositioned  Home Living Family/patient expects to be discharged to:: Private residence Living Arrangements: Parent Available Help at Discharge: Family;Available 24 hours/day Type of Home: House Home Access: Level entry     Home Layout: Two level;Able to live on main level with bedroom/bathroom     Bathroom Shower/Tub: Teacher, early years/pre: Standard     Home Equipment: Wheelchair - manual;Cane - single point;Tub bench;Hand held shower head          Prior Functioning/Environment Level of Independence: Needs assistance  Gait / Transfers Assistance Needed: Primarily using w/c. Father light assistance for functional mobility in home with SPC. ADL's / Homemaking Assistance Needed: Performs dressing and BADLs; father assists with bathing       Frequency  Min 2X/week        Progress Toward Goals  OT Goals(current goals can now be found in the care plan section)  Progress towards OT goals: Progressing toward goals  Acute Rehab OT Goals Patient Stated Goal: Get stronger OT Goal Formulation: With  patient/family Time For Goal Achievement: 04/23/21 Potential to Achieve Goals: Good ADL Goals Pt Will Perform Grooming: with set-up;with supervision;sitting Pt Will Perform Upper Body Dressing: with set-up;with supervision;sitting Pt Will Perform Lower Body Dressing: with min assist;sit to/from stand Pt Will Transfer to Toilet: with min assist;ambulating;bedside commode Pt Will Perform Toileting - Clothing Manipulation and hygiene: with min assist;sit to/from stand;sitting/lateral leans Additional ADL Goal #1: Pt will tolerate splint wear schedule to reduce contraction in preparation for ADLs Additional ADL Goal #2: Pt will follow two step commands during ADLs with Min cues  Plan Discharge plan remains appropriate    Co-evaluation                 AM-PAC OT "6 Clicks" Daily Activity     Outcome Measure   Help from another person eating meals?: A Lot Help from another person taking care of personal grooming?: A Little Help from another person toileting, which includes using toliet, bedpan, or urinal?: A Lot Help from another person bathing (including washing, rinsing, drying)?: A Lot Help from another person to put on and taking off regular upper body clothing?: A Lot Help from another person to put on and taking off regular lower body clothing?: A Lot 6 Click Score: 13    End of Session Equipment Utilized During Treatment: Gait belt;Other (comment) (stedy)  OT Visit Diagnosis: Unsteadiness on feet (R26.81);Other abnormalities of gait and mobility (R26.89);Muscle weakness (generalized) (M62.81);Hemiplegia and hemiparesis Hemiplegia - Right/Left: Left Hemiplegia - dominant/non-dominant: Non-Dominant Hemiplegia - caused by: Cerebral infarction   Activity Tolerance Patient tolerated treatment well   Patient Left with family/visitor  present;in bed;with nursing/sitter in room   Nurse Communication Mobility status        Time: 5501-5868 OT Time Calculation (min): 9  min  Charges: OT General Charges $OT Visit: 1 Visit OT Evaluation $OT Eval Moderate Complexity: 1 Mod OT Treatments $Self Care/Home Management : 8-22 mins  Carlisia Geno MSOT, OTR/L Acute Rehab Pager: [phone] Office: [place] 04/09/2021, 1:49 PM

## 2021-04-09 NOTE — Evaluation (Signed)
Occupational Therapy Evaluation Patient Details Name: Howard White MRN: [id number] DOB: Feb 09, 1961 Today's Date: 04/09/2021    History of Present Illness 60 yo male presenting to ED due to 2-3 falls and BLE weakness. MRI  acute small vessel infarct involving R corona radiata and basal ganglia. PMH including astrocytoma with left frontal craniotomy resection 20 years ago, seizure disorder, hypothyroidism, history of CVA with [name] in November 2021, spastic bladder, and ambulatory dysfunction.    Clinical Impression   PTA, pt was living with his parents and performed 36 of BADLs; father assisting with bathing and functional mobility using a cane. Pt currently requiring Mod A for UB ADLs, Max A for LB ADLs, and Min-Mod A +2 for functional mobility. Pt presenting with worsened LUE hemiparesis, cognition deficits, coordination deficits, and poor balance. Pt with high motivation and good family support. Pt would benefit from L resting hand splint; will notified MD and order. Pt would benefit from further acute OT to facilitate safe dc. Recommend dc to CIR for intensive OT to optimize safety, independence with ADLs, and return to PLOF.     Follow Up Recommendations  CIR    Equipment Recommendations  Other (comment) (defer to next venue)    Recommendations for Other Services PT consult     Precautions / Restrictions Precautions Precautions: Fall      Mobility Bed Mobility Overal bed mobility: Needs Assistance Bed Mobility: Supine to Sit     Supine to sit: Min assist;+2 for safety/equipment;HOB elevated     General bed mobility comments: significant time and effort. Cues for managing L side. Min A for bringing L hip towards EOB    Transfers Overall transfer level: Needs assistance Equipment used: 1 person hand held assist Transfers: Sit to/from Stand Sit to Stand: Min assist;From elevated surface;+2 physical assistance         General transfer comment: Min A +2  for power up into standing. Blocking of L knee. Use of RUE on therapist's hand for support    Balance Overall balance assessment: Needs assistance Sitting-balance support: No upper extremity supported;Feet supported Sitting balance-Leahy Scale: Fair     Standing balance support: Single extremity supported;During functional activity Standing balance-Leahy Scale: Poor Standing balance comment: Reliant on physical A. Significant posterior lean (increasing with fatigue)                           ADL either performed or assessed with clinical judgement   ADL Overall ADL's : Needs assistance/impaired Eating/Feeding: Moderate assistance;Sitting   Grooming: Moderate assistance;Sitting;Min guard Grooming Details (indicate cue type and reason): Mod A for bilateral coorindation. Min A for managing wash clothe Upper Body Bathing: Moderate assistance;Sitting   Lower Body Bathing: Maximal assistance;+2 for physical assistance;Sit to/from stand   Upper Body Dressing : Moderate assistance;Sitting   Lower Body Dressing: Maximal assistance;+2 for physical assistance;Sit to/from stand   Toilet Transfer: Moderate assistance;+2 for physical assistance;Ambulation;Cueing for sequencing;Minimal assistance           Functional mobility during ADLs: Moderate assistance;+2 for physical assistance       Vision Baseline Vision/History: Wears glasses Wears Glasses: At all times (prisms) Patient Visual Report: No change from baseline Additional Comments: Need to further assess     Perception     Praxis      Pertinent Vitals/Pain Pain Assessment: Faces Faces Pain Scale: No hurt Pain Intervention(s): Monitored during session;Limited activity within patient's tolerance;Repositioned     Hand Dominance  Right   Extremity/Trunk Assessment Upper Extremity Assessment Upper Extremity Assessment: LUE deficits/detail LUE Deficits / Details: Presenting at brunnstrom level 3 with flexion  synergy pattern into a indwelled fist. Pt requiring increased cues "for relaxing" and time to PROM into extension at hand, wrist, and elbow. At times able to perform AROM of digits into extension. LUE Coordination: decreased fine motor;decreased gross motor   Lower Extremity Assessment Lower Extremity Assessment: Defer to PT evaluation   Cervical / Trunk Assessment Cervical / Trunk Assessment: Other exceptions Cervical / Trunk Exceptions: Tendency for forwawrd flexion of neck with R head turn   Communication Communication Communication: No difficulties   Cognition Arousal/Alertness: Awake/alert;Lethargic (yawning and sleepy) Behavior During Therapy: WFL for tasks assessed/performed;Impulsive Overall Cognitive Status: Impaired/Different from baseline Area of Impairment: Attention;Memory;Following commands;Safety/judgement;Awareness;Problem solving;Orientation                 Orientation Level: Disoriented to;Time (stating it is 2020) Current Attention Level: Sustained Memory: Decreased short-term memory Following Commands: Follows one step commands with increased time;Follows one step commands inconsistently Safety/Judgement: Decreased awareness of safety;Decreased awareness of deficits Awareness: Intellectual Problem Solving: Slow processing;Difficulty sequencing;Requires verbal cues General Comments: Very motivated despie fatigue. Pt with decreased attention, problem solving, sequencing, and awareness. Requiring increased time and cues throughout. Once fatigued, pt having decreased coorindaton and requiring Max cues for safety to sit at recliner.   General Comments  Mother present at 60 and end of session    Exercises     Shoulder Instructions      Home Living Family/patient expects to be discharged to:: Private residence Living Arrangements: Parent Available Help at Discharge: Family;Available 24 hours/day Type of Home: House Home Access: Level entry     Home  Layout: Two level;Able to live on main level with bedroom/bathroom     Bathroom Shower/Tub: Teacher, early years/pre: Standard     Home Equipment: Wheelchair - manual;Cane - single point;Tub bench;Hand held shower head          Prior Functioning/Environment Level of Independence: Needs assistance  Gait / Transfers Assistance Needed: Primarily using w/c. Father light assistance for functional mobility in home with SPC. ADL's / Homemaking Assistance Needed: Performs dressing and BADLs; father assists with bathing            OT Problem List: Decreased strength;Decreased range of motion;Impaired balance (sitting and/or standing);Decreased activity tolerance;Decreased cognition;Decreased safety awareness;Decreased knowledge of use of DME or AE;Decreased coordination;Impaired UE functional use      OT Treatment/Interventions:      OT Goals(Current goals can be found in the care plan section) Acute Rehab OT Goals Patient Stated Goal: Get stronger OT Goal Formulation: With patient/family Time For Goal Achievement: 04/23/21 Potential to Achieve Goals: Good  OT Frequency: Min 2X/week   Barriers to D/C:            Co-evaluation              AM-PAC OT "6 Clicks" Daily Activity     Outcome Measure Help from another person eating meals?: A Lot Help from another person taking care of personal grooming?: A Little Help from another person toileting, which includes using toliet, bedpan, or urinal?: A Lot Help from another person bathing (including washing, rinsing, drying)?: A Lot Help from another person to put on and taking off regular upper body clothing?: A Lot Help from another person to put on and taking off regular lower body clothing?: A Lot 6 Click Score: 13  End of Session Equipment Utilized During Treatment: Gait belt Nurse Communication: Mobility status  Activity Tolerance: Patient tolerated treatment well Patient left: in chair;with call bell/phone  within reach;with chair alarm set;with family/visitor present  OT Visit Diagnosis: Unsteadiness on feet (R26.81);Other abnormalities of gait and mobility (R26.89);Muscle weakness (generalized) (M62.81);Hemiplegia and hemiparesis Hemiplegia - Right/Left: Left Hemiplegia - dominant/non-dominant: Non-Dominant Hemiplegia - caused by: Cerebral infarction                Time: 8184-0375 OT Time Calculation (min): 29 min Charges:  OT General Charges $OT Visit: 1 Visit OT Evaluation $OT Eval Moderate Complexity: [place], OTR/L Acute Rehab Pager: [phone] Office: [place] 04/09/2021, 12:52 PM

## 2021-04-09 NOTE — Consult Note (Signed)
Physical Medicine and Rehabilitation Consult Reason for Consult: Bilateral lower extremity weakness with falls Referring Physician: Dr.Mikhail  HPI: Howard White is a 60 y.o. 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  History taken from chart review, mother, and patient.  Patient lives with elderly parents.  He used a wheelchair for mobility PTA with assistive device for transfers.  Father helps with some ADLs, but cannot assist with mobility or transfers.  Two-level home bed and bath main level.  He presented on 04/08/2021 with bilateral bilateral lower extremity weakness of acute onset.  CT/MRI showed acute small vessel infarct involving right corona radiata and adjacent basal ganglia.  MRA of head and neck pending.  Echocardiogram with ejection fraction of 60 to 65%, no wall motion abnormalities grade 1 diastolic dysfunction.  EEG pending.  Admission chemistries unremarkable, urinalysis positive nitrite.  Presently maintained on aspirin as well as Plavix for CVA prophylaxis.  Subcutaneous Lovenox for DVT prophylaxis.  Tolerating a regular diet.  Therapy evaluations completed due to patient's decreased functional mobility recommendations of physical medicine rehab consult.  Review of Systems  Constitutional: Positive for malaise/fatigue. Negative for chills and fever.  HENT: Negative for hearing loss.   Eyes: Negative for blurred vision and double vision.  Respiratory: Negative for cough and shortness of breath.   Cardiovascular: Negative for chest pain, palpitations and leg swelling.  Gastrointestinal: Positive for constipation. Negative for heartburn, nausea and vomiting.  Genitourinary: Negative for dysuria and hematuria.   Musculoskeletal: Positive for myalgias.  Skin: Negative for rash.  Neurological: Positive for tremors, seizures and weakness.  Psychiatric/Behavioral: The patient has insomnia.   All other systems reviewed and are negative.  Past Medical History:  Diagnosis Date  . Brain cancer ([place])   . Seizures ([place])   . Thyroid disease    Hypothyroid   Past Surgical History:  Procedure Laterality Date  . brain cancer    . BRAIN SURGERY     Family History  Problem Relation Age of Onset  . Brain cancer Other   . Thyroid disease Other   . Cancer Other   . Diabetes Other   . Stroke Other    Social History:  reports that he has never smoked. He has never used smokeless tobacco. He reports current alcohol use. He reports that he does not use drugs. Allergies:  Allergies  Allergen Reactions  . Lactose Intolerance (Gi)    Medications Prior to Admission  Medication Sig Dispense Refill  . acetaminophen (TYLENOL) 325 MG tablet Take 2 tablets (650 mg total) by mouth every 6 (six) hours as needed for mild pain (or Fever >/= 101).    Marland Kitchen aspirin EC 81 MG EC tablet Take 1 tablet (81 mg total) by mouth daily. Swallow whole. (Patient taking differently: Take 81 mg by mouth every evening. Swallow whole.) 30 tablet 11  . atorvastatin (LIPITOR) 40 MG tablet Take 40 mg by mouth every evening.    . Baclofen 5 MG TABS Take 5-10 mg by mouth as directed. Take 1 tablet (5 mg) in the morning and Take 2 tablets (10 mg) at bedtime    . calcium carbonate (TUMS - DOSED IN MG ELEMENTAL CALCIUM) 500 MG chewable tablet Chew 1 tablet (200 mg of elemental calcium total) by mouth daily. (Patient taking differently: Chew 1  tablet by mouth daily as needed for indigestion.) 30 tablet 0  . docusate sodium (COLACE) 100 MG capsule Take 2 capsules (200 mg total) by mouth 2 (two) times daily. (Patient taking differently: Take 200 mg by mouth 2 (two) times daily as needed for mild constipation.) 10 capsule 0  . escitalopram (LEXAPRO)  10 MG tablet Take 1 tablet (10 mg total) by mouth daily. 30 tablet 0  . famotidine (PEPCID) 20 MG tablet Take 1 tablet (20 mg total) by mouth daily. 30 tablet 0  . famotidine (PEPCID) 20 MG tablet Take 20 mg by mouth daily.    . fluticasone (FLONASE) 50 MCG/ACT nasal spray Place 1 spray into both nostrils 2 (two) times daily as needed for allergies.    Marland Kitchen ketotifen (ZADITOR) 0.025 % ophthalmic solution Place 1 drop into both eyes 2 (two) times daily as needed (for itchy eyes). 5 mL 0  . lamoTRIgine (LAMICTAL) 150 MG tablet TAKE 1 AND 1/2 TABS EVERY MORNING AND 2 TAB EVERY EVENING (Patient taking differently: Take 225-300 mg by mouth See admin instructions. TAKE 1 AND 1/2 TABS ( 225mg )  EVERY MORNING AND 2 TAB (300mg ) EVERY EVENING) 315 tablet 3  . melatonin 3 MG TABS tablet Take 2 tablets (6 mg total) by mouth at bedtime. (Patient taking differently: Take 6 mg by mouth at bedtime as needed (sleep).) 30 tablet 0  . Multiple Vitamins-Minerals (MULTIVITAMIN WITH MINERALS) tablet Take 1 tablet by mouth daily.    . polyethylene glycol (MIRALAX / GLYCOLAX) 17 g packet Take 17 g by mouth daily. (Patient taking differently: Take 17 g by mouth daily as needed for moderate constipation.) 14 each 0  . SYNTHROID 112 MCG tablet Take 1 tablet (112 mcg total) by mouth at bedtime. 30 tablet 0    Home: Home Living Family/patient expects to be discharged to:: Private residence Living Arrangements: Parent Available Help at Discharge: Family,Available 24 hours/day Type of Home: House Home Access: Level entry Home Layout: Two level,Able to live on main level with bedroom/bathroom Bathroom Shower/Tub: Chiropodist: Standard Home Equipment: Wheelchair - manual,Cane - single point,Tub bench,Hand held shower head  Functional History: Prior Function Level of Independence: Needs assistance Gait / Transfers Assistance Needed: Primarily using w/c. Father light assistance for functional mobility in  home with SPC. ADL's / Homemaking Assistance Needed: Performs dressing and BADLs; father assists with bathing Functional Status:  Mobility: Bed Mobility Overal bed mobility: Needs Assistance Bed Mobility: Supine to Sit Supine to sit: Min assist,+2 for safety/equipment,HOB elevated General bed mobility comments: significant time and effort. Cues for managing L side. Min A for bringing L hip towards EOB Transfers Overall transfer level: Needs assistance Equipment used: 1 person hand held assist Transfers: Sit to/from Stand Sit to Stand: Min assist,From elevated surface,+2 physical assistance General transfer comment: Min A +2 for power up into standing. Blocking of L knee. Use of RUE on therapist's hand for support      ADL: ADL Overall ADL's : Needs assistance/impaired Eating/Feeding: Moderate assistance,Sitting Grooming: Moderate assistance,Sitting,Min guard Grooming Details (indicate cue type and reason): Mod A for bilateral coorindation. Min A for managing wash clothe Upper Body Bathing: Moderate assistance,Sitting Lower Body Bathing: Maximal assistance,+2 for physical assistance,Sit to/from stand Upper Body Dressing : Moderate assistance,Sitting Lower Body Dressing: Maximal assistance,+2 for physical assistance,Sit to/from stand Toilet Transfer: Moderate assistance,+2 for physical assistance,Ambulation,Cueing for sequencing,Minimal assistance Functional mobility during ADLs: Moderate assistance,+2 for physical assistance  Cognition: Cognition Overall Cognitive Status: Impaired/Different from baseline Orientation  Level: Oriented X4 Cognition Arousal/Alertness: Awake/alert,Lethargic (yawning and sleepy) Behavior During Therapy: WFL for tasks assessed/performed,Impulsive Overall Cognitive Status: Impaired/Different from baseline Area of Impairment: Attention,Memory,Following commands,Safety/judgement,Awareness,Problem solving,Orientation Orientation Level: Disoriented to,Time  (stating it is 2020) Current Attention Level: Sustained Memory: Decreased short-term memory Following Commands: Follows one step commands with increased time,Follows one step commands inconsistently Safety/Judgement: Decreased awareness of safety,Decreased awareness of deficits Awareness: Intellectual Problem Solving: Slow processing,Difficulty sequencing,Requires verbal cues General Comments: Very motivated despie fatigue. Pt with decreased attention, problem solving, sequencing, and awareness. Requiring increased time and cues throughout. Once fatigued, pt having decreased coorindaton and requiring Max cues for safety to sit at recliner.  Blood pressure 123/77, pulse 73, temperature 98.3 F (36.8 C), temperature source Oral, resp. rate 16, SpO2 93 %. Physical Exam Vitals reviewed.  Constitutional:      General: He is in acute distress.     Appearance: He is not ill-appearing.  HENT:     Head: Normocephalic and atraumatic.     Right Ear: External ear normal.     Left Ear: External ear normal.     Nose: Nose normal.  Eyes:     General:        Right eye: No discharge.        Left eye: No discharge.     Extraocular Movements: Extraocular movements intact.  Cardiovascular:     Rate and Rhythm: Normal rate and regular rhythm.  Pulmonary:     Effort: Pulmonary effort is normal. No respiratory distress.     Breath sounds: No stridor.  Abdominal:     General: Abdomen is flat. Bowel sounds are normal. There is no distension.  Musculoskeletal:     Cervical back: Normal range of motion and neck supple.     Comments: No edema or tenderness in extremities  Skin:    General: Skin is warm and dry.  Neurological:     Mental Status: He is alert.     Comments: Alert and Oriented x2.   Fair insight and awareness. Motor exam: limited by persistent significant myoclonus, RUE/RLE appears to be 5/5 LLE: ?4+/5 LUE: ?Shoulder abduction, elbow flex/ext 4-/5, distally 0/5  Psychiatric:         Mood and Affect: Mood normal.        Behavior: Behavior normal.     Results for orders placed or performed during the hospital encounter of 04/08/21 (from the past 24 hour(s))  Urine culture     Status: None (Preliminary result)   Collection Time: 04/08/21  1:28 PM   Specimen: Urine, Random  Result Value Ref Range   Specimen Description URINE, RANDOM    Special Requests NONE    Culture      CULTURE REINCUBATED FOR BETTER GROWTH Performed at [facility], [address] [place], [place] [address]    Report Status PENDING   Resp Panel by RT-PCR (Flu A&B, Covid) Nasopharyngeal Swab     Status: None   Collection Time: 04/08/21  1:51 PM   Specimen: Nasopharyngeal Swab; Nasopharyngeal(NP) swabs in vial transport medium  Result Value Ref Range   SARS Coronavirus 2 by RT PCR NEGATIVE NEGATIVE   Influenza A by PCR NEGATIVE NEGATIVE   Influenza B by PCR NEGATIVE NEGATIVE  CBC     Status: None   Collection Time: 04/09/21 12:53 AM  Result Value Ref Range   WBC 8.6 4.0 - 10.5 K/uL   RBC 4.83 4.22 - 5.81 MIL/uL   Hemoglobin 15.3 13.0 - 17.0  g/dL   HCT 44.1 39.0 - 52.0 %   MCV 91.3 80.0 - 100.0 fL   MCH 31.7 26.0 - 34.0 pg   MCHC 34.7 30.0 - 36.0 g/dL   RDW 11.7 11.5 - 15.5 %   Platelets 264 150 - 400 K/uL   nRBC 0.0 0.0 - 0.2 %  Comprehensive metabolic panel     Status: Abnormal   Collection Time: 04/09/21 12:53 AM  Result Value Ref Range   Sodium 134 (L) 135 - 145 mmol/L   Potassium 3.8 3.5 - 5.1 mmol/L   Chloride 102 98 - 111 mmol/L   CO2 24 22 - 32 mmol/L   Glucose, Bld 121 (H) 70 - 99 mg/dL   BUN 13 6 - 20 mg/dL   Creatinine, Ser 1.16 0.61 - 1.24 mg/dL   Calcium 9.1 8.9 - 10.3 mg/dL   Total Protein 5.9 (L) 6.5 - 8.1 g/dL   Albumin 3.7 3.5 - 5.0 g/dL   AST 23 15 - 41 U/L   ALT 21 0 - 44 U/L   Alkaline Phosphatase 70 38 - 126 U/L   Total Bilirubin 0.9 0.3 - 1.2 mg/dL   GFR, Estimated >60 >60 mL/min   Anion gap 8 5 - 15  TSH     Status: None   Collection Time:  04/09/21 12:53 AM  Result Value Ref Range   TSH 4.098 0.350 - 4.500 uIU/mL  Lipid panel     Status: None   Collection Time: 04/09/21 12:53 AM  Result Value Ref Range   Cholesterol 101 0 - 200 mg/dL   Triglycerides 50 <150 mg/dL   HDL 48 >40 mg/dL   Total CHOL/HDL Ratio 2.1 RATIO   VLDL 10 0 - 40 mg/dL   LDL Cholesterol 43 0 - 99 mg/dL   CT Head Wo Contrast  Result Date: 04/08/2021 CLINICAL DATA:  Weakness.  Falls. EXAM: CT HEAD WITHOUT CONTRAST TECHNIQUE: Contiguous axial images were obtained from the base of the skull through the vertex without intravenous contrast. COMPARISON:  Brain MRI 09/26/2020 FINDINGS: Brain: Cystic density in the left frontal region correlating with remote history of astrocytoma resection. Confluent chronic small vessel ischemia in the cerebral white matter with perforator infarct at the right basal ganglia on a chronic basis. Suspect accelerated disease from radiotherapy. No acute hemorrhage, hydrocephalus, or worrisome mass effect Vascular: Atheromatous calcification Skull: Diffuse heterogeneity of the skull, likely radiation effects. Unremarkable remote left frontal craniotomy. Sinuses/Orbits: Negative IMPRESSION: 1. No acute finding. 2. Remote left cerebral glioma resection. 3. 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 Electronically Signed   By: Monte Fantasia M.D.   On: 04/08/2021 10:50   MR BRAIN WO CONTRAST  Result Date: 04/08/2021 CLINICAL DATA:  Following EXAM: MRI HEAD WITHOUT CONTRAST TECHNIQUE: Multiplanar, multiecho pulse sequences of the brain and surrounding structures were obtained without intravenous contrast. COMPARISON:  09/26/2020 FINDINGS: Motion artifact is present.  SWI was not obtained. Brain: There is restricted diffusion involving the right corona radiata extending from body of caudate toward the lentiform nucleus. This is anterior to a similarly oriented chronic infarct. There is a CSF intensity resection cavity  of the left frontal lobe abutting or communicating with the left lateral ventricle. Ventricles and sulci are stable in size and configuration. No mass effect. Confluent areas of T2 hyperintensity in the supratentorial white matter are nonspecific and may reflect chronic microvascular ischemic changes and/or therapy related changes. Vascular: Major vessel flow voids at the skull base are preserved. Skull  and upper cervical spine: Normal marrow signal is grossly preserved. Sinuses/Orbits: Paranasal sinuses are aerated. Orbits are unremarkable. Other: Sella is unremarkable.  Mastoid air cells are clear. IMPRESSION: Motion degraded study. Acute small vessel infarct involving right corona radiata and adjacent basal ganglia. Chronic findings detailed above. Electronically Signed   By: Macy Mis M.D.   On: 04/08/2021 12:42   ECHOCARDIOGRAM COMPLETE  Result Date: 04/08/2021    ECHOCARDIOGRAM REPORT   Patient Name:   Howard White Date of Exam: 04/08/2021 Medical Rec #:  [id number]    Height:       70.0 in Accession #:    [id number]   Weight:       188.0 lb Date of Birth:  1960/12/30   BSA:          2.033 m Patient Age:    30 years     BP:           137/79 mmHg Patient Gender: M            HR:           74 bpm. Exam Location:  Inpatient Procedure: 2D Echo, Cardiac Doppler and Color Doppler Indications:    CVA  History:        Patient has prior history of Echocardiogram examinations, most                 recent 09/27/2020. Stroke. Brain cancer.  Sonographer:    Dustin Flock Referring Phys: [id number] Leslee Home  Sonographer Comments: No subcostal window. IMPRESSIONS  1. Left ventricular ejection fraction, by estimation, is 60 to 65%. The left ventricle has normal function. The left ventricle has no regional wall motion abnormalities. Left ventricular diastolic parameters are consistent with Grade I diastolic dysfunction (impaired relaxation).  2. Right ventricular systolic function is normal. The right  ventricular size is normal.  3. The mitral valve is normal in structure. Trivial mitral valve regurgitation. No evidence of mitral stenosis.  4. The aortic valve is normal in structure. Aortic valve regurgitation is not visualized. No aortic stenosis is present.  5. The inferior vena cava is normal in size with greater than 50% respiratory variability, suggesting right atrial pressure of 3 mmHg. FINDINGS  Left Ventricle: Left ventricular ejection fraction, by estimation, is 60 to 65%. The left ventricle has normal function. The left ventricle has no regional wall motion abnormalities. The left ventricular internal cavity size was normal in size. There is  no left ventricular hypertrophy. Left ventricular diastolic parameters are consistent with Grade I diastolic dysfunction (impaired relaxation). Normal left ventricular filling pressure. Right Ventricle: The right ventricular size is normal. No increase in right ventricular wall thickness. Right ventricular systolic function is normal. Left Atrium: Left atrial size was normal in size. Right Atrium: Right atrial size was normal in size. Pericardium: There is no evidence of pericardial effusion. Mitral Valve: The mitral valve is normal in structure. Trivial mitral valve regurgitation. No evidence of mitral valve stenosis. Tricuspid Valve: The tricuspid valve is normal in structure. Tricuspid valve regurgitation is not demonstrated. No evidence of tricuspid stenosis. Aortic Valve: The aortic valve is normal in structure. Aortic valve regurgitation is not visualized. No aortic stenosis is present. Pulmonic Valve: The pulmonic valve was normal in structure. Pulmonic valve regurgitation is not visualized. No evidence of pulmonic stenosis. Aorta: The aortic root is normal in size and structure. Venous: The inferior vena cava is normal in size with greater than 50% respiratory variability, suggesting right atrial  pressure of 3 mmHg. IAS/Shunts: No atrial level shunt  detected by color flow Doppler.  LEFT VENTRICLE PLAX 2D LVIDd:         4.60 cm  Diastology LVIDs:         3.00 cm  LV e' medial:    6.85 cm/s LV PW:         1.10 cm  LV E/e' medial:  7.6 LV IVS:        0.90 cm  LV e' lateral:   8.92 cm/s LVOT diam:     2.30 cm  LV E/e' lateral: 5.8 LV SV:         81 LV SV Index:   40 LVOT Area:     4.15 cm  RIGHT VENTRICLE RV Basal diam:  2.90 cm RV S prime:     14.00 cm/s TAPSE (M-mode): 1.9 cm LEFT ATRIUM             Index       RIGHT ATRIUM           Index LA diam:        3.30 cm 1.62 cm/m  RA Area:     14.30 cm LA Vol (A2C):   37.2 ml 18.30 ml/m RA Volume:   33.20 ml  16.33 ml/m LA Vol (A4C):   34.6 ml 17.02 ml/m LA Biplane Vol: 36.3 ml 17.85 ml/m  AORTIC VALVE LVOT Vmax:   102.00 cm/s LVOT Vmean:  62.500 cm/s LVOT VTI:    0.195 m  AORTA Ao Root diam: 3.30 cm MITRAL VALVE MV Area (PHT): 4.89 cm    SHUNTS MV Decel Time: 155 msec    Systemic VTI:  0.20 m MV E velocity: 51.80 cm/s  Systemic Diam: 2.30 cm MV A velocity: 60.40 cm/s MV E/A ratio:  0.86 Mihai Croitoru MD Electronically signed by Sanda Klein MD Signature Date/Time: 04/08/2021/4:28:55 PM    Final    Assessment/Plan: Diagnosis: Right corona radiata and adjacent basal ganglia.   Stroke: Continue secondary stroke prophylaxis and Risk Factor Modification listed below:   Antiplatelet therapy:   Blood Pressure Management:  Continue current medication with prn's with permisive HTN per primary team Statin Agent:   Right sided hemiparesis: fit for orthosis to prevent contractures (resting hand splint for day, wrist cock up splint at night) PT/OT for mobility, ADL training  Motor recovery: Zoloft Labs independently reviewed.  Records reviewed and summated above.  1. Does the need for close, 24 hr/day medical supervision in concert with the patient's rehab needs make it unreasonable for this patient to be served in a less intensive setting? Yes  2. Co-Morbidities requiring supervision/potential  complications: astrocytoma with left frontal craniotomy resection 20 years ago, seizure disorder (Lamictal), hypothyroidism (cont meds, ensure appropriate mood and energy level for therapies), history of CVA with left-sided weakness, spastic bladder.   3. Due to bladder management, bowel management, safety, skin/wound care, disease management, pain management and patient education, does the patient require 24 hr/day rehab nursing? Yes 4. Does the patient require coordinated care of a physician, rehab nurse, therapy disciplines of PT/OT to address physical and functional deficits in the context of the above medical diagnosis(es)? Yes Addressing deficits in the following areas: balance, endurance, locomotion, strength, transferring, bowel/bladder control, bathing, dressing, feeding, grooming, toileting and psychosocial support 5. Can the patient actively participate in an intensive therapy program of at least 3 hrs of therapy per day at least 5 days per week? Potentially 6. The potential for patient  to make measurable gains while on inpatient rehab is excellent 7. Anticipated functional outcomes upon discharge from inpatient rehab are supervision and min assist  with PT, supervision and min assist with OT, n/a with SLP. 8. Estimated rehab length of stay to reach the above functional goals is: 17-20 days. 9. Anticipated discharge destination: Home 10. Overall Rehab/Functional Prognosis: fair  RECOMMENDATIONS: This patient's condition is appropriate for continued rehabilitative care in the following setting: CIR medically stable and able tolerate 3 hours of therapy per day if adequate caregiver support available upon discharge. Patient has agreed to participate in recommended program. Yes Note that insurance prior authorization may be required for reimbursement for recommended care.  Comment: Rehab Admissions Coordinator to follow up.  I have personally performed a face to face diagnostic evaluation,  including, but not limited to relevant history and physical exam findings, of this patient and developed relevant assessment and plan.  Additionally, I have reviewed and concur with the physician assistant's documentation above.   Delice Lesch, MD, ABPMR Lavon Paganini Angiulli, PA-C 04/09/2021

## 2021-04-09 NOTE — Progress Notes (Signed)
PROGRESS NOTE    Howard White  IRW:[id number] DOB: 26-Feb-1961 DOA: 04/08/2021 PCP: Seward Carol, MD   Brief Narrative:  HPI On 04/08/2021 by Dr. Imagene Sheller Salmaan Patchin is a 60 y.o. male with medical history significant with history of astrocytoma with left frontal craniotomy resection 20 years ago, seizure disorder, hypothyroidism, history of CVA in November 2021, spastic bladder, ambulatory dysfunction.  The patient presented to the emergency department due to 2-3 falls while working with physical therapy today and bilateral lower extremity weakness.  Denies hitting his head.  Denies loss of consciousness. He has residual left-sided weakness of the upper and lower extremity since his previous stroke in November 2021. He was started on dual antiplatelet therapy with aspirin and Plavix for 3 weeks and then transitioned to aspirin alone.  He was also started on a high intensity statin therapy as well.  He was discharged to inpatient rehab therapy.  No slurred speech, no swallowing difficulties, no visual deficits, no cognitive deficits from his previous stroke.  Eating a regular diet.  Since previous stroke he was initially wheelchair-bound but with PT/OT has progressed to using a single pronged cane occasionally and able to get up out of the wheelchair to go to the bathroom or sit in a recliner at home.  Has been compliant with taking his medications.  His mother assists with his medications and puts them in a pill pack for him. The patient is asking if he can have something to drink because he is thirsty.  Has not had a formal follow evaluation or nursing swallow evaluation yet.  Neurology midlevel provider at bedside during my exam as well.  In the emergency department Shows relatively normal labs.  T head without contrast showed no acute findings.  MRI was positive for vessel infarct involving the right corona radiata and adjacent basal ganglia.  tPA was not given in the emergency department.  NIH  stroke scale upon my evaluation 2-3.  Interim history Patient admitted with CVA, pending workup and therapy evaluations. Assessment & Plan   Acute CVA -Patient presented with lower extremity weakness -CT head showed no acute finding -MRI brain showed acute small vessel infarct involving the right corona radiata and adjacent basal ganglia -Of note, patient did have CVA in November 2021 and has had residual left-sided weakness.  He was placed on DAPT at that time followed by aspirin alone. -Echocardiogram shows an EF of 60 to 76%, grade 1 diastolic dysfunction.  No regional wall motion abnormalities. -LDL 43, hemoglobin A1c 4.098 -Neurology consulted and appreciated -Currently on aspirin, Plavix, statin -Pending therapy evaluations.  Patient was already receiving home therapy.  Possible urinary tract infection -UA showed positive nitrites, few bacteria, 0-5 WBCs -Urine culture pending -Currently on ceftriaxone  Hypothyroidism -Continue Synthroid  Seizure disorder -Stable, continue Lamictal  History of astrocytoma -With resection  DVT Prophylaxis  lovenox  Code Status: Full  Family Communication: none at bedside  Disposition Plan:  Status is: Inpatient  Remains inpatient appropriate because:Ongoing diagnostic testing needed not appropriate for outpatient work up and Inpatient level of care appropriate due to severity of illness   Dispo: The patient is from: Home              Anticipated d/c is to: TBD              Patient currently is not medically stable to d/c.   Difficult to place patient No   Consultants Neurology  Procedures  Echocardiogram  Antibiotics  Anti-infectives (From admission, onward)   Start     Dose/Rate Route Frequency Ordered Stop   04/08/21 1600  cefTRIAXone (ROCEPHIN) 1 g in sodium chloride 0.9 % 100 mL IVPB        1 g 200 mL/hr over 30 Minutes Intravenous Every 24 hours 04/08/21 1528 04/15/21 1559      Subjective:   Howard White  seen and examined today. Patient continues to feel weak.  No complaints of chest pain, shortness of breath, or abdominal pain or dizziness, headache.  Objective:   Vitals:   04/08/21 1933 04/08/21 2336 04/09/21 0322 04/09/21 0736  BP: 140/89 (!) 112/59 (!) 92/56 112/78  Pulse: 87 68 66 71  Resp: 18 18 18 16   Temp: 98.1 F (36.7 C) 98 F (36.7 C) 97.8 F (36.6 C) 98.3 F (36.8 C)  TempSrc: Oral Oral Oral Oral  SpO2: 94% 92% 95% 94%    Intake/Output Summary (Last 24 hours) at 04/09/2021 0924 Last data filed at 04/09/2021 0349 Gross per 24 hour  Intake 240 ml  Output 200 ml  Net 40 ml   There were no vitals filed for this visit.  Exam  General: Well developed, chronically ill-appearing, NAD, appears stated age  HEENT: NCAT, PERRLA, EOMI, Anicteic Sclera, mucous membranes moist.   Neck: Supple  Cardiovascular: S1 S2 auscultated, no rubs, murmurs or gallops. Regular rate and rhythm.  Respiratory: Clear to auscultation bilaterally with equal chest rise  Abdomen: Soft, nontender, nondistended, + bowel sounds  Extremities: warm dry without cyanosis clubbing or edema  Neuro: AAOx3, LUE spastic, LLE strength 4/5.  RUE and right LE strength 5/5.  Skin: Without rashes exudates or nodules  Psych: Normal affect and demeanor with intact judgement and insight   Data Reviewed: I have personally reviewed following labs and imaging studies  CBC: Recent Labs  Lab 04/08/21 0934 04/09/21 0053  WBC 6.6 8.6  NEUTROABS 3.9  --   HGB 15.4 15.3  HCT 45.6 44.1  MCV 94.0 91.3  PLT 279 284   Basic Metabolic Panel: Recent Labs  Lab 04/08/21 0934 04/09/21 0053  NA 137 134*  K 4.1 3.8  CL 103 102  CO2 24 24  GLUCOSE 97 121*  BUN 17 13  CREATININE 1.08 1.16  CALCIUM 9.3 9.1   GFR: CrCl cannot be calculated (Unknown ideal weight.). Liver Function Tests: Recent Labs  Lab 04/08/21 0934 04/09/21 0053  AST 22 23  ALT 23 21  ALKPHOS 70 70  BILITOT 1.0 0.9  PROT 5.9*  5.9*  ALBUMIN 3.9 3.7   No results for input(s): LIPASE, AMYLASE in the last 168 hours. No results for input(s): AMMONIA in the last 168 hours. Coagulation Profile: No results for input(s): INR, PROTIME in the last 168 hours. Cardiac Enzymes: No results for input(s): CKTOTAL, CKMB, CKMBINDEX, TROPONINI in the last 168 hours. BNP (last 3 results) No results for input(s): PROBNP in the last 8760 hours. HbA1C: No results for input(s): HGBA1C in the last 72 hours. CBG: Recent Labs  Lab 04/08/21 0952  GLUCAP 96   Lipid Profile: Recent Labs    04/09/21 0053  CHOL 101  HDL 48  LDLCALC 43  TRIG 50  CHOLHDL 2.1   Thyroid Function Tests: Recent Labs    04/09/21 0053  TSH 4.098   Anemia Panel: No results for input(s): VITAMINB12, FOLATE, FERRITIN, TIBC, IRON, RETICCTPCT in the last 72 hours. Urine analysis:    Component Value Date/Time   COLORURINE YELLOW 04/08/2021 0934  APPEARANCEUR CLEAR 04/08/2021 0934   LABSPEC 1.018 04/08/2021 0934   PHURINE 6.0 04/08/2021 [place] 04/08/2021 [place] 04/08/2021 [place] 04/08/2021 [place] 04/08/2021 0934   PROTEINUR NEGATIVE 04/08/2021 0934   NITRITE POSITIVE (A) 04/08/2021 0934   LEUKOCYTESUR NEGATIVE 04/08/2021 0934   Sepsis Labs: @LABRCNTIP (procalcitonin:4,lacticidven:4)  ) Recent Results (from the past 240 hour(s))  Resp Panel by RT-PCR (Flu A&B, Covid) Nasopharyngeal Swab     Status: None   Collection Time: 04/08/21  1:51 PM   Specimen: Nasopharyngeal Swab; Nasopharyngeal(NP) swabs in vial transport medium  Result Value Ref Range Status   SARS Coronavirus 2 by RT PCR NEGATIVE NEGATIVE Final    Comment: (NOTE) SARS-CoV-2 target nucleic acids are NOT DETECTED.  The SARS-CoV-2 RNA is generally detectable in upper respiratory specimens during the acute phase of infection. The lowest concentration of SARS-CoV-2 viral copies this assay can detect is 138  copies/mL. A negative result does not preclude SARS-Cov-2 infection and should not be used as the sole basis for treatment or other patient management decisions. A negative result may occur with  improper specimen collection/handling, submission of specimen other than nasopharyngeal swab, presence of viral mutation(s) within the areas targeted by this assay, and inadequate number of viral copies(<138 copies/mL). A negative result must be combined with clinical observations, patient history, and epidemiological information. The expected result is Negative.  Fact Sheet for Patients:  EntrepreneurPulse.com.au  Fact Sheet for Healthcare Providers:  IncredibleEmployment.be  This test is no t yet approved or cleared by the [place] FDA and  has been authorized for detection and/or diagnosis of SARS-CoV-2 by FDA under an Emergency Use Authorization (EUA). This EUA will remain  in effect (meaning this test can be used) for the duration of the COVID-19 declaration under Section 564(b)(1) of the Act, 21 U.S.C.section 360bbb-3(b)(1), unless the authorization is terminated  or revoked sooner.       Influenza A by PCR NEGATIVE NEGATIVE Final   Influenza B by PCR NEGATIVE NEGATIVE Final    Comment: (NOTE) The Xpert Xpress SARS-CoV-2/FLU/RSV plus assay is intended as an aid in the diagnosis of influenza from Nasopharyngeal swab specimens and should not be used as a sole basis for treatment. Nasal washings and aspirates are unacceptable for Xpert Xpress SARS-CoV-2/FLU/RSV testing.  Fact Sheet for Patients: EntrepreneurPulse.com.au  Fact Sheet for Healthcare Providers: IncredibleEmployment.be  This test is not yet approved or cleared by the [place] FDA and has been authorized for detection and/or diagnosis of SARS-CoV-2 by FDA under an Emergency Use Authorization (EUA). This EUA will remain in effect (meaning  this test can be used) for the duration of the COVID-19 declaration under Section 564(b)(1) of the Act, 21 U.S.C. section 360bbb-3(b)(1), unless the authorization is terminated or revoked.  Performed at [facility], [place] [address], [place], [place] [address]       Radiology Studies: CT Head Wo Contrast  Result Date: 04/08/2021 CLINICAL DATA:  Weakness.  Falls. EXAM: CT HEAD WITHOUT CONTRAST TECHNIQUE: Contiguous axial images were obtained from the base of the skull through the vertex without intravenous contrast. COMPARISON:  Brain MRI 09/26/2020 FINDINGS: Brain: Cystic density in the left frontal region correlating with remote history of astrocytoma resection. Confluent chronic small vessel ischemia in the cerebral white matter with perforator infarct at the right basal ganglia on a chronic basis. Suspect accelerated disease from radiotherapy. No acute hemorrhage, hydrocephalus, or worrisome mass effect  Vascular: Atheromatous calcification Skull: Diffuse heterogeneity of the skull, likely radiation effects. Unremarkable remote left frontal craniotomy. Sinuses/Orbits: Negative IMPRESSION: 1. No acute finding. 2. Remote left cerebral glioma resection. 3. 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 Electronically Signed   By: Monte Fantasia M.D.   On: 04/08/2021 10:50   MR BRAIN WO CONTRAST  Result Date: 04/08/2021 CLINICAL DATA:  Following EXAM: MRI HEAD WITHOUT CONTRAST TECHNIQUE: Multiplanar, multiecho pulse sequences of the brain and surrounding structures were obtained without intravenous contrast. COMPARISON:  09/26/2020 FINDINGS: Motion artifact is present.  SWI was not obtained. Brain: There is restricted diffusion involving the right corona radiata extending from body of caudate toward the lentiform nucleus. This is anterior to a similarly oriented chronic infarct. There is a CSF intensity resection cavity of the left frontal lobe abutting or communicating  with the left lateral ventricle. Ventricles and sulci are stable in size and configuration. No mass effect. Confluent areas of T2 hyperintensity in the supratentorial white matter are nonspecific and may reflect chronic microvascular ischemic changes and/or therapy related changes. Vascular: Major vessel flow voids at the skull base are preserved. Skull and upper cervical spine: Normal marrow signal is grossly preserved. Sinuses/Orbits: Paranasal sinuses are aerated. Orbits are unremarkable. Other: Sella is unremarkable.  Mastoid air cells are clear. IMPRESSION: Motion degraded study. Acute small vessel infarct involving right corona radiata and adjacent basal ganglia. Chronic findings detailed above. Electronically Signed   By: Macy Mis M.D.   On: 04/08/2021 12:42   ECHOCARDIOGRAM COMPLETE  Result Date: 04/08/2021    ECHOCARDIOGRAM REPORT   Patient Name:   JESUS NEVILLS Date of Exam: 04/08/2021 Medical Rec #:  [id number]    Height:       70.0 in Accession #:    [id number]   Weight:       188.0 lb Date of Birth:  Jun 30, 1961   BSA:          2.033 m Patient Age:    67 years     BP:           137/79 mmHg Patient Gender: M            HR:           74 bpm. Exam Location:  Inpatient Procedure: 2D Echo, Cardiac Doppler and Color Doppler Indications:    CVA  History:        Patient has prior history of Echocardiogram examinations, most                 recent 09/27/2020. Stroke. Brain cancer.  Sonographer:    Dustin Flock Referring Phys: [id number] Leslee Home  Sonographer Comments: No subcostal window. IMPRESSIONS  1. Left ventricular ejection fraction, by estimation, is 60 to 65%. The left ventricle has normal function. The left ventricle has no regional wall motion abnormalities. Left ventricular diastolic parameters are consistent with Grade I diastolic dysfunction (impaired relaxation).  2. Right ventricular systolic function is normal. The right ventricular size is normal.  3. The mitral valve is normal in  structure. Trivial mitral valve regurgitation. No evidence of mitral stenosis.  4. The aortic valve is normal in structure. Aortic valve regurgitation is not visualized. No aortic stenosis is present.  5. The inferior vena cava is normal in size with greater than 50% respiratory variability, suggesting right atrial pressure of 3 mmHg. FINDINGS  Left Ventricle: Left ventricular ejection fraction, by estimation, is 60 to 65%. The left ventricle has normal function. The  left ventricle has no regional wall motion abnormalities. The left ventricular internal cavity size was normal in size. There is  no left ventricular hypertrophy. Left ventricular diastolic parameters are consistent with Grade I diastolic dysfunction (impaired relaxation). Normal left ventricular filling pressure. Right Ventricle: The right ventricular size is normal. No increase in right ventricular wall thickness. Right ventricular systolic function is normal. Left Atrium: Left atrial size was normal in size. Right Atrium: Right atrial size was normal in size. Pericardium: There is no evidence of pericardial effusion. Mitral Valve: The mitral valve is normal in structure. Trivial mitral valve regurgitation. No evidence of mitral valve stenosis. Tricuspid Valve: The tricuspid valve is normal in structure. Tricuspid valve regurgitation is not demonstrated. No evidence of tricuspid stenosis. Aortic Valve: The aortic valve is normal in structure. Aortic valve regurgitation is not visualized. No aortic stenosis is present. Pulmonic Valve: The pulmonic valve was normal in structure. Pulmonic valve regurgitation is not visualized. No evidence of pulmonic stenosis. Aorta: The aortic root is normal in size and structure. Venous: The inferior vena cava is normal in size with greater than 50% respiratory variability, suggesting right atrial pressure of 3 mmHg. IAS/Shunts: No atrial level shunt detected by color flow Doppler.  LEFT VENTRICLE PLAX 2D LVIDd:          4.60 cm  Diastology LVIDs:         3.00 cm  LV e' medial:    6.85 cm/s LV PW:         1.10 cm  LV E/e' medial:  7.6 LV IVS:        0.90 cm  LV e' lateral:   8.92 cm/s LVOT diam:     2.30 cm  LV E/e' lateral: 5.8 LV SV:         81 LV SV Index:   40 LVOT Area:     4.15 cm  RIGHT VENTRICLE RV Basal diam:  2.90 cm RV S prime:     14.00 cm/s TAPSE (M-mode): 1.9 cm LEFT ATRIUM             Index       RIGHT ATRIUM           Index LA diam:        3.30 cm 1.62 cm/m  RA Area:     14.30 cm LA Vol (A2C):   37.2 ml 18.30 ml/m RA Volume:   33.20 ml  16.33 ml/m LA Vol (A4C):   34.6 ml 17.02 ml/m LA Biplane Vol: 36.3 ml 17.85 ml/m  AORTIC VALVE LVOT Vmax:   102.00 cm/s LVOT Vmean:  62.500 cm/s LVOT VTI:    0.195 m  AORTA Ao Root diam: 3.30 cm MITRAL VALVE MV Area (PHT): 4.89 cm    SHUNTS MV Decel Time: 155 msec    Systemic VTI:  0.20 m MV E velocity: 51.80 cm/s  Systemic Diam: 2.30 cm MV A velocity: 60.40 cm/s MV E/A ratio:  0.86 Mihai Croitoru MD Electronically signed by Sanda Klein MD Signature Date/Time: 04/08/2021/4:28:55 PM    Final      Scheduled Meds: . aspirin EC  81 mg Oral Daily  . atorvastatin  40 mg Oral Daily  . baclofen  5 mg Oral TID  . calcium carbonate  1 tablet Oral Daily  . clopidogrel  75 mg Oral Daily  . docusate sodium  200 mg Oral BID  . enoxaparin (LOVENOX) injection  40 mg Subcutaneous Daily  . escitalopram  10 mg Oral  Daily  . famotidine  20 mg Oral Daily  . lamoTRIgine  225 mg Oral q morning  . lamoTRIgine  300 mg Oral QPM  . levothyroxine  112 mcg Oral QHS  . melatonin  6 mg Oral QHS  . multivitamin with minerals  1 tablet Oral Daily  . polyethylene glycol  17 g Oral Daily   Continuous Infusions: . cefTRIAXone (ROCEPHIN)  IV 1 g (04/08/21 1739)     LOS: 1 day   Time Spent in minutes   45 minutes  Emannuel Vise D.O. on 04/09/2021 at 9:24 AM  Between 7am to 7pm - Please see pager noted on [email or web address]  After 7pm go to [email or web address]  And look for the night  coverage person covering for me after hours  [facility] Office  [phone]

## 2021-04-09 NOTE — Progress Notes (Signed)
This RN noticed neurologic changes such as a tremor to the right side and agitation. MD notified and pt was examined. No changes made to treatment plan. Will update on coming nurse.

## 2021-04-10 ENCOUNTER — Inpatient Hospital Stay (HOSPITAL_COMMUNITY): Payer: PPO

## 2021-04-10 DIAGNOSIS — G40209 Localization-related (focal) (partial) symptomatic epilepsy and epileptic syndromes with complex partial seizures, not intractable, without status epilepticus: Secondary | ICD-10-CM

## 2021-04-10 DIAGNOSIS — Z85841 Personal history of malignant neoplasm of brain: Secondary | ICD-10-CM

## 2021-04-10 DIAGNOSIS — I639 Cerebral infarction, unspecified: Secondary | ICD-10-CM | POA: Diagnosis not present

## 2021-04-10 DIAGNOSIS — I6930 Unspecified sequelae of cerebral infarction: Secondary | ICD-10-CM | POA: Diagnosis not present

## 2021-04-10 DIAGNOSIS — N319 Neuromuscular dysfunction of bladder, unspecified: Secondary | ICD-10-CM

## 2021-04-10 DIAGNOSIS — E039 Hypothyroidism, unspecified: Secondary | ICD-10-CM

## 2021-04-10 LAB — CBC WITH DIFFERENTIAL/PLATELET
Abs Immature Granulocytes: 0.02 10*3/uL (ref 0.00–0.07)
Basophils Absolute: 0 10*3/uL (ref 0.0–0.1)
Basophils Relative: 0 %
Eosinophils Absolute: 0.1 10*3/uL (ref 0.0–0.5)
Eosinophils Relative: 2 %
HCT: 46.6 % (ref 39.0–52.0)
Hemoglobin: 15.9 g/dL (ref 13.0–17.0)
Immature Granulocytes: 0 %
Lymphocytes Relative: 21 %
Lymphs Abs: 1.5 10*3/uL (ref 0.7–4.0)
MCH: 31.3 pg (ref 26.0–34.0)
MCHC: 34.1 g/dL (ref 30.0–36.0)
MCV: 91.7 fL (ref 80.0–100.0)
Monocytes Absolute: 0.8 10*3/uL (ref 0.1–1.0)
Monocytes Relative: 12 %
Neutro Abs: 4.4 10*3/uL (ref 1.7–7.7)
Neutrophils Relative %: 65 %
Platelets: 291 10*3/uL (ref 150–400)
RBC: 5.08 MIL/uL (ref 4.22–5.81)
RDW: 11.9 % (ref 11.5–15.5)
WBC: 6.8 10*3/uL (ref 4.0–10.5)
nRBC: 0 % (ref 0.0–0.2)

## 2021-04-10 LAB — BASIC METABOLIC PANEL
Anion gap: 6 (ref 5–15)
BUN: 13 mg/dL (ref 6–20)
CO2: 28 mmol/L (ref 22–32)
Calcium: 9.4 mg/dL (ref 8.9–10.3)
Chloride: 104 mmol/L (ref 98–111)
Creatinine, Ser: 1.04 mg/dL (ref 0.61–1.24)
GFR, Estimated: >60 mL/min (ref >60)
Glucose, Bld: 101 mg/dL — ABNORMAL HIGH (ref 70–99)
Potassium: 3.9 mmol/L (ref 3.5–5.1)
Sodium: 138 mmol/L (ref 135–145)

## 2021-04-10 LAB — URINE CULTURE: Culture: >=100000 — AB

## 2021-04-10 MED ORDER — CEPHALEXIN 500 MG PO CAPS
500.0000 mg | ORAL_CAPSULE | Freq: Two times a day (BID) | ORAL | Status: AC
  Administered 2021-04-10 – 2021-04-12 (×6): 500 mg via ORAL
  Filled 2021-04-10 (×6): qty 1

## 2021-04-10 MED ORDER — BACLOFEN 10 MG PO TABS
5.0000 mg | ORAL_TABLET | Freq: Two times a day (BID) | ORAL | Status: DC | PRN
  Administered 2021-04-10: 5 mg via ORAL
  Filled 2021-04-10: qty 1

## 2021-04-10 MED ORDER — HYDROXYZINE HCL 25 MG PO TABS
25.0000 mg | ORAL_TABLET | ORAL | Status: AC | PRN
Start: 2021-04-10 — End: 2021-04-11
  Administered 2021-04-10 – 2021-04-11 (×2): 25 mg via ORAL
  Filled 2021-04-10 (×2): qty 1

## 2021-04-10 NOTE — Progress Notes (Signed)
Inpatient Rehabilitation Admissions Coordinator  I have received approval for CIR admit pending bed availability early next week. I have notified his Mom, acute team and TOC. I will follow up on Monday.  Danne Baxter, RN, MSN Rehab Admissions Coordinator [phone] 04/10/2021 4:04 PM

## 2021-04-10 NOTE — PMR Pre-admission (Signed)
PMR Admission Coordinator Pre-Admission Assessment  Patient: Howard White is an 60 y.o., male MRN: [id number] DOB: 04-13-1961 Height: 5\' 10"  (177.8 cm) Weight: 85.3 kg              Insurance Information HMO:     PPO: yes     PCP:      IPA:      80/20:      OTHER:  PRIMARY: [employer]      Policy#: [id number]      Subscriber: pt CM Name: Tammy      Phone#: [phone]     Fax#: EPIC access Pre-Cert#: [id number] approved for 7 days   Employer:  Benefits:  Phone #: [phone]     Name: 5/27 Eff. Date: 11/15/2018     Deduct: none      Out of Pocket Max: $2450      Life Max: none  CIR: $325 co pay per day days 1 until 6      SNF: no copay days 1 until 20; $184 co pay per day days 21 until 100 Outpatient: $15 co pay per visit     Co-Pay: visits per medical neccesity Home Health: 80%      Co-Pay: visit per medical neccesity DME: 80%     Co-Pay: 20% Providers: in network  SECONDARY: none       Financial Counselor:       Phone#:   The Engineer, [employer]" for patients in Inpatient Rehabilitation Facilities with attached "Privacy Act [place] Records" was provided and verbally reviewed with: Patient and Family  Emergency Contact Information Contact Information    Name Relation Home Work Mobile   [place] Mother [phone]  [phone]     Current Medical History  Patient Admitting Diagnosis: CVA  History of Present Illness:59 y.o. 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  He presented on 04/08/2021 with bilateral bilateral lower extremity weakness of acute onset.  CT/MRI showed acute small vessel infarct involving right corona radiata and adjacent basal ganglia.  MRA of head and neck pending.   Echocardiogram with ejection fraction of 60 to 65%, no wall motion abnormalities grade 1 diastolic dysfunction.   Admission chemistries unremarkable, urinalysis positive nitrite.  Presently maintained on aspirin as well as Plavix for CVA prophylaxis.  Subcutaneous Lovenox for DVT prophylaxis.  Tolerating a regular diet.    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 Was place on Ceftriaxone and transitioned to Keflex through 5/20.  EEG showed evidence of eppileptogenicity and cortical dysfunction left fromcentreal region consistent with underlying craniotomy. No seizures were seen throughout recording. Continue Lamictal. Continue Lexapro for depression and anxiety. Continue scheduled atarax and ativan prn for anxiety.   Complete NIHSS TOTAL: 6 Glasgow Coma Scale Score: 14  Past Medical History  Past Medical History:  Diagnosis Date  . Brain cancer ([place])   . Seizures ([place])   . Thyroid disease    Hypothyroid   Family History  family history includes Brain cancer in an other family member; Cancer in an other family member; Diabetes in an other family member; Stroke in an other family member; Thyroid disease in an other family member.  Prior Rehab/Hospitalizations:  Has the patient had prior rehab or hospitalizations prior to admission? Yes CIR 09/2020  Has the patient had major surgery during 100  days prior to admission? No  Current Medications   Current Facility-Administered Medications:  .  acetaminophen (TYLENOL) tablet 650 mg, 650 mg, Oral, Q6H PRN, Anwar, Shayan S, DO, 650 mg at 04/13/21 1018 .  aspirin EC tablet 81 mg, 81 mg, Oral, Daily, Anwar, Shayan S, DO, 81 mg at 04/13/21 1020 .  atorvastatin (LIPITOR) tablet 40 mg, 40 mg, Oral, Daily, Anwar, Shayan S, DO, 40 mg at 04/13/21 1020 .  baclofen (LIORESAL) tablet 5 mg, 5 mg, Oral, TID, Anwar, Shayan S, DO, 5 mg at 04/13/21 2243 .  baclofen (LIORESAL) tablet 5 mg, 5 mg, Oral, Q12H PRN, Bailey-Modzik, Delila A, NP, 5 mg at  04/10/21 1128 .  calcium carbonate (TUMS - dosed in mg elemental calcium) chewable tablet 200 mg of elemental calcium, 1 tablet, Oral, Daily, Anwar, Shayan S, DO, 200 mg of elemental calcium at 04/13/21 1020 .  clopidogrel (PLAVIX) tablet 75 mg, 75 mg, Oral, Daily, Anwar, Shayan S, DO, 75 mg at 04/13/21 1020 .  docusate sodium (COLACE) capsule 200 mg, 200 mg, Oral, BID, Imagene Sheller S, DO, 200 mg at 04/13/21 2244 .  enoxaparin (LOVENOX) injection 40 mg, 40 mg, Subcutaneous, Daily, Anwar, Shayan S, DO, 40 mg at 04/13/21 1035 .  escitalopram (LEXAPRO) tablet 10 mg, 10 mg, Oral, Daily, Anwar, Shayan S, DO, 10 mg at 04/13/21 1020 .  famotidine (PEPCID) tablet 20 mg, 20 mg, Oral, Daily, Anwar, Shayan S, DO, 20 mg at 04/13/21 1020 .  hydrOXYzine (ATARAX/VISTARIL) tablet 25 mg, 25 mg, Oral, TID, Mikhail, Velta Addison, DO, 25 mg at 04/13/21 2243 .  ketotifen (ZADITOR) 0.025 % ophthalmic solution 1 drop, 1 drop, Both Eyes, BID PRN, Imagene Sheller S, DO, 1 drop at 04/09/21 2238 .  lamoTRIgine (LAMICTAL) tablet 225 mg, 225 mg, Oral, q morning, Anwar, Shayan S, DO, 225 mg at 04/13/21 1021 .  lamoTRIgine (LAMICTAL) tablet 300 mg, 300 mg, Oral, QPM, Anwar, Shayan S, DO, 300 mg at 04/13/21 2244 .  levothyroxine (SYNTHROID) tablet 112 mcg, 112 mcg, Oral, QHS, Imagene Sheller S, DO, 112 mcg at 04/13/21 2244 .  LORazepam (ATIVAN) tablet 0.5 mg, 0.5 mg, Oral, Q6H PRN, Cristal Ford, DO, 0.5 mg at 04/14/21 0108 .  melatonin tablet 6 mg, 6 mg, Oral, QHS, Anwar, Shayan S, DO, 6 mg at 04/13/21 2244 .  multivitamin with minerals tablet 1 tablet, 1 tablet, Oral, Daily, Anwar, Shayan S, DO, 1 tablet at 04/13/21 1020 .  polyethylene glycol (MIRALAX / GLYCOLAX) packet 17 g, 17 g, Oral, Daily, Imagene Sheller S, DO, 17 g at 04/12/21 4854  Patients Current Diet:  Diet Order            Diet regular Room service appropriate? Yes; Fluid consistency: Thin  Diet effective now                 Precautions /  Restrictions Precautions Precautions: Fall Restrictions Weight Bearing Restrictions: No   Has the patient had 2 or more falls or a fall with injury in the past year?Yes  Prior Activity Level Limited Community (1-2x/wk): attends outpatient therapy; min asisst with parents asisstance; left hemiplegia  Prior Functional Level Prior Function Level of Independence: Needs assistance Gait / Transfers Assistance Needed: Primarily using w/c. Father light assistance for functional mobility in home with SPC. ADL's / Homemaking Assistance Needed: Performs dressing and BADLs; father assists with bathing  Self Care: Did the patient need help bathing, dressing, using the toilet or eating?  Needed some help  Indoor Mobility: Did  the patient need assistance with walking from room to room (with or without device)? Needed some help  Stairs: Did the patient need assistance with internal or external stairs (with or without device)? Needed some help  Functional Cognition: Did the patient need help planning regular tasks such as shopping or remembering to take medications? Needed some help  Home Assistive Devices / [place] Devices/Equipment: Gilford Rile (specify type) Home Equipment: Wheelchair - manual,Cane - single point,Tub bench,Hand held shower head  Prior Device Use: Indicate devices/aids used by the patient prior to current illness, exacerbation or injury? Manual wheelchair and Walker  Current Functional Level Cognition  Arousal/Alertness: Awake/alert Overall Cognitive Status: Impaired/Different from baseline Current Attention Level: Focused,Sustained (very short sustained attention; quickly internally distracted) Orientation Level: Oriented to person,Oriented to place Following Commands: Follows one step commands with increased time,Follows one step commands inconsistently Safety/Judgement: Decreased awareness of safety,Decreased awareness of deficits General Comments: Decreased  attention this session compared to last with frequent cueing and increased time. Patient with increasing coordination deficits especially on R side this session requiring max cues and assistance to safely sit EOB. Pt internally distracted and perseverating on certain phrases such as "chest up chest up" or "head high head high." Attention: Sustained Sustained Attention: Impaired Sustained Attention Impairment: Functional basic,Verbal basic Memory:  (difficult to assess) Behaviors: Restless Safety/Judgment: Impaired    Extremity Assessment (includes Sensation/Coordination)  Upper Extremity Assessment: LUE deficits/detail LUE Deficits / Details: Presenting at brunnstrom level 3 with flexion synergy pattern into a indwelled fist. Pt requiring increased cues "for relaxing" and time to PROM into extension at hand, wrist, and elbow. At times able to perform AROM of digits into extension. Noting no redness or edema from splint (donned by RN ~10:30) LUE Coordination: decreased fine motor,decreased gross motor  Lower Extremity Assessment: Defer to PT evaluation LLE Deficits / Details: Brunstrom level 3 with extension synergy. Cues for relaxation and PROM into flexion seated EOB. Difficult to assess MMT due to spasticity    ADLs  Overall ADL's : Needs assistance/impaired Eating/Feeding: Moderate assistance,Sitting Grooming: Moderate assistance,Sitting,Min guard Grooming Details (indicate cue type and reason): Mod A for bilateral coorindation. Min A for managing wash clothe Upper Body Bathing: Moderate assistance,Sitting Lower Body Bathing: Maximal assistance,+2 for physical assistance,Sit to/from stand Upper Body Dressing : Moderate assistance,Sitting Lower Body Dressing: Maximal assistance,+2 for physical assistance,Sit to/from stand Toilet Transfer: Cueing for sequencing,Minimal assistance,+2 for physical assistance (sit<>stand with stedy) Toilet Transfer Details (indicate cue type and reason): Min  A +2 for power up into standing with use of stedy. Continues to persent with LLE extension and LUE flexion patterns Functional mobility during ADLs: Moderate assistance,+2 for physical assistance (sit<>stand) General ADL Comments: Focused session on splint check and sitting balance. Pt presenting with decreased balance, coorindation, strength, and cognition compared to yesterdar's session    Mobility  Overal bed mobility: Needs Assistance Bed Mobility: Supine to Sit,Sit to Supine Supine to sit: Mod assist,+2 for physical assistance,+2 for safety/equipment Sit to supine: Max assist,+2 for physical assistance,+2 for safety/equipment General bed mobility comments: Difficulty sequencing and required increased time and assistance this session. incoordination of R UE and LE noted during transitions. Cues for deep breathing and relaxation to calm patient    Transfers  Overall transfer level: Needs assistance Equipment used: 1 person hand held assist Transfer via Lift Equipment: Stedy Transfers: Sit to/from [employer] to Stand: +2 physical assistance,+2 safety/equipment,Mod assist Stand pivot transfers: Total assist,+2 physical assistance,+2 safety/equipment General transfer comment: Mod  A +2 for power up and then blocking of L knee. As pt became more fatigued in standing, requiring Max A for posterior lean    Ambulation / Gait / Stairs / Wheelchair Mobility  Ambulation/Gait Ambulation/Gait assistance: Mod assist,+2 physical assistance,+2 safety/equipment Gait Distance (Feet): 5 Feet Assistive device: 1 person hand held assist Gait Pattern/deviations: Step-to pattern,Decreased step length - right,Decreased step length - left,Decreased stride length,Decreased weight shift to right,Decreased weight shift to left,Shuffle,Leaning posteriorly,Narrow base of support General Gait Details: deferred due to poor sitting balance Gait velocity: decreased    Posture / Balance Dynamic  Sitting Balance Sitting balance - Comments: required maxA to assist maintaining sitting balance but blocking L LE and placing on floor. At times, blocking of R LE to maintain sitting balance. Patient with posterior and L lateral lean requiring multimodal cueing for upright posture, however difficulty following commands. Balance Overall balance assessment: Needs assistance Sitting-balance support: Single extremity supported,Feet supported Sitting balance-Leahy Scale: Poor Sitting balance - Comments: required maxA to assist maintaining sitting balance but blocking L LE and placing on floor. At times, blocking of R LE to maintain sitting balance. Patient with posterior and L lateral lean requiring multimodal cueing for upright posture, however difficulty following commands. Postural control: Posterior lean,Left lateral lean Standing balance support: Single extremity supported Standing balance-Leahy Scale: Zero Standing balance comment: totalA to maintain standing EOB    Special needs/care consideration Very anxious at baseline   Previous Home Environment  Living Arrangements: Parent  Lives With: Family Available Help at Discharge: Family,Available 24 hours/day Type of Home: House Home Layout: Two level,Able to live on main level with bedroom/bathroom Home Access: Level entry Bathroom Shower/Tub: Chiropodist: Standard Bathroom Accessibility: Yes Home Care Services: No Additional Comments: attends outpatient therapy  Discharge Living Setting Plans for Discharge Living Setting: Lives with (comment) (parents) Type of Home at Discharge: House Discharge Home Layout: Two level,Able to live on main level with bedroom/bathroom Discharge Home Access: Level entry Discharge Bathroom Shower/Tub: Tub/shower unit Discharge Bathroom Toilet: Standard Discharge Bathroom Accessibility: Yes How Accessible: Accessible via walker Does the patient have any problems obtaining your  medications?: No  Social/Family/Support Systems Contact Information: Mom, Electrical engineer Anticipated Caregiver: parents Anticipated Caregiver's Contact Information: see above Ability/Limitations of Caregiver: no limitations Caregiver Availability: 24/7 Discharge Plan Discussed with Primary Caregiver: Yes Is Caregiver In Agreement with Plan?: Yes Does Caregiver/Family have Issues with Lodging/Transportation while Pt is in Rehab?: No  Goals Patient/Family Goal for Rehab: min PT  and OT, supervision SLP Expected length of stay: [facility] 17 to 20 days Pt/Family Agrees to Admission and willing to participate: Yes Program Orientation Provided & Reviewed with Pt/Caregiver Including Roles  & Responsibilities: Yes  Decrease burden of Care through IP rehab admission: n/a  Possible need for SNF placement upon discharge:not anticipated  Patient Condition: This patient's medical and functional status has changed since the consult dated: 04/10/2021 in which the Rehabilitation Physician determined and documented that the patient's condition is appropriate for intensive rehabilitative care in an inpatient rehabilitation facility. See "History of Present Illness" (above) for medical update. Functional changes are: overall mod asisst. Patient's medical and functional status update has been discussed with the Rehabilitation physician and patient remains appropriate for inpatient rehabilitation. Will admit to inpatient rehab today.  Preadmission Screen Completed By:  Cleatrice Burke, RN, 04/14/2021 10:02 AM ______________________________________________________________________   Discussed status with Dr. Dagoberto Ligas on 04/14/2021 at  1002 and received approval for admission today.  Admission Coordinator:  Danne Baxter  Jefm Bryant, time 1002 Date 04/14/2021

## 2021-04-10 NOTE — Progress Notes (Signed)
Occupational Therapy Treatment Patient Details Name: Howard White MRN: [id number] DOB: [birth year] Today's Date: 04/10/2021    History of present illness 60 yo male presenting to ED due to 2-3 falls and BLE weakness. MRI  acute small vessel infarct involving R corona radiata and basal ganglia. PMH including astrocytoma with left frontal craniotomy resection 20 years ago, seizure disorder, hypothyroidism, history of CVA with [name] in November 2021, spastic bladder, and ambulatory dysfunction.   OT comments  Pt continues to present with high motivation to participate in therapy. Noting this session that pt presents with decreased R sided coordination, sitting balance, LUE control, attention, and awareness compared to OT evaluation. Pt requiring Mod-Max A +2 for bed mobility to sit at EOB. Pt with poor sitting balance and significant L lateral lean. Pt requiring initial Mod A for sitting balance and then progressing to Max A. Pt with poor attention, internally distracted, and perseverating on certain phrases. Pt also presenting with pushing of RUE and uncoordinated, repetitive movement of RUE/RLE. Mod A +2 for power up into standing and Max A +2 for maintaining standing balance. Notified RN and MD about change in functional status compared to OT evaluation. Donned L resting hand splint and performed skin check; no redness or edema. Continue to highly recommend dc to CIR for intensive OT due to pt's high motivation, change in functional status, good family support, and age. Will continue to follow acutely as admitted.   Provided splint wear schedule - four hours on and four hours off; wear at night.   Follow Up Recommendations  CIR    Equipment Recommendations  Other (comment) (Defer to next venue)    Recommendations for Other Services PT consult    Precautions / Restrictions Precautions Precautions: Fall Restrictions Weight Bearing Restrictions: No       Mobility Bed  Mobility Overal bed mobility: Needs Assistance Bed Mobility: Supine to Sit;Sit to Supine     Supine to sit: Mod assist;+2 for physical assistance;+2 for safety/equipment Sit to supine: Max assist;+2 for physical assistance;+2 for safety/equipment   General bed mobility comments: Difficulty sequencing and required increased time and assistance this session. incoordination of R UE and LE noted during transitions. Cues for deep breathing and relaxation to calm patient    Transfers Overall transfer level: Needs assistance Equipment used: 1 person hand held assist Transfers: Sit to/from Stand;Stand Pivot Transfers Sit to Stand: +2 physical assistance;+2 safety/equipment;Mod assist        General transfer comment: Mod A +2 for power up and then blocking of L knee. As pt became more fatigued in standing, requiring Max A for posterior lean    Balance Overall balance assessment: Needs assistance Sitting-balance support: Single extremity supported;Feet supported Sitting balance-Leahy Scale: Poor Sitting balance - Comments: required maxA to assist maintaining sitting balance but blocking L LE and placing on floor. At times, blocking of R LE to maintain sitting balance. Patient with posterior and L lateral lean requiring multimodal cueing for upright posture, however difficulty following commands. Postural control: Posterior lean;Left lateral lean Standing balance support: Single extremity supported Standing balance-Leahy Scale: Zero Standing balance comment: totalA to maintain standing EOB                           ADL either performed or assessed with clinical judgement   ADL Overall ADL's : Needs assistance/impaired  Functional mobility during ADLs: Moderate assistance;+2 for physical assistance (sit<>stand) General ADL Comments: Focused session on splint check and sitting balance. Pt presenting with decreased balance,  coorindation, strength, and cognition compared to yesterdar's session     Vision       Perception     Praxis      Cognition Arousal/Alertness: Lethargic;Awake/alert Behavior During Therapy: Restless;Impulsive;Anxious;Flat affect Overall Cognitive Status: Impaired/Different from baseline Area of Impairment: Attention;Memory;Following commands;Safety/judgement;Awareness;Problem solving                   Current Attention Level: Focused;Sustained (very short sustained attention; quickly internally distracted) Memory: Decreased short-term memory Following Commands: Follows one step commands with increased time;Follows one step commands inconsistently Safety/Judgement: Decreased awareness of safety;Decreased awareness of deficits Awareness: Intellectual Problem Solving: Slow processing;Difficulty sequencing;Requires verbal cues;Requires tactile cues General Comments: Decreased attention this session compared to last with frequent cueing and increased time. Patient with increasing coordination deficits especially on R side this session requiring max cues and assistance to safely sit EOB. Pt internally distracted and perseverating on certain phrases such as "chest up chest up" or "head high head high." Responding well to calming cues        Exercises Exercises: Other exercises Other Exercises Other Exercises: splint check for L resting hang splint. No redness or edema   Shoulder Instructions       General Comments Mother present at 78 of session. Providing splint wear schedule to RN (placed in room).    Pertinent Vitals/ Pain       Pain Assessment: Faces Faces Pain Scale: No hurt Pain Intervention(s): Monitored during session;Limited activity within patient's tolerance;Repositioned  Home Living   Living Arrangements:  (Lives with his parents) Available Help at Discharge: Family;Available 24 hours/day Type of Home: House                   Bathroom  Accessibility: Yes       Additional Comments: attends outpatient therapy  Lives With: Family    Prior Functioning/Environment              Frequency  Min 2X/week        Progress Toward Goals  OT Goals(current goals can now be found in the care plan section)  Progress towards OT goals: Not progressing toward goals - comment (Decreased in attention, coorindation, and balance this session; notified MD of change)  Acute Rehab OT Goals Patient Stated Goal: Get stronger OT Goal Formulation: With patient/family Time For Goal Achievement: 04/23/21 Potential to Achieve Goals: Good ADL Goals Pt Will Perform Grooming: with set-up;with supervision;sitting Pt Will Perform Upper Body Dressing: with set-up;with supervision;sitting Pt Will Perform Lower Body Dressing: with min assist;sit to/from stand Pt Will Transfer to Toilet: with min assist;ambulating;bedside commode Pt Will Perform Toileting - Clothing Manipulation and hygiene: with min assist;sit to/from stand;sitting/lateral leans Additional ADL Goal #1: Pt will tolerate splint wear schedule to reduce contraction in preparation for ADLs Additional ADL Goal #2: Pt will follow two step commands during ADLs with Min cues  Plan Discharge plan remains appropriate    Co-evaluation    PT/OT/SLP Co-Evaluation/Treatment: Yes Reason for Co-Treatment: For patient/therapist safety;To address functional/ADL transfers PT goals addressed during session: Balance;Mobility/safety with mobility OT goals addressed during session: ADL's and self-care      AM-PAC OT "6 Clicks" Daily Activity     Outcome Measure   Help from another person eating meals?: A Lot Help from another person taking care of personal grooming?: A Little  Help from another person toileting, which includes using toliet, bedpan, or urinal?: A Lot Help from another person bathing (including washing, rinsing, drying)?: A Lot Help from another person to put on and taking  off regular upper body clothing?: A Lot Help from another person to put on and taking off regular lower body clothing?: A Lot 6 Click Score: 13    End of Session Equipment Utilized During Treatment: Gait belt  OT Visit Diagnosis: Unsteadiness on feet (R26.81);Other abnormalities of gait and mobility (R26.89);Muscle weakness (generalized) (M62.81);Hemiplegia and hemiparesis Hemiplegia - Right/Left: Left Hemiplegia - dominant/non-dominant: Non-Dominant Hemiplegia - caused by: Cerebral infarction   Activity Tolerance Patient limited by fatigue   Patient Left in bed;with bed alarm set;with call bell/phone within reach   Nurse Communication Mobility status        Time: 2411-4643 OT Time Calculation (min): 23 min  Charges: OT General Charges $OT Visit: 1 Visit OT Treatments $Therapeutic Activity: 8-22 mins  [place], OTR/L Acute Rehab Pager: [phone] Office: [place] 04/10/2021, 1:47 PM

## 2021-04-10 NOTE — Therapy (Signed)
[facility] [address] [place], [place], [address] Phone: [phone]   Fax:  [phone]  Patient Details  Name: Howard White MRN: [id number] Date of Birth: 11/27/1960 Referring Provider:  Charlett Blake, MD  Encounter Date: 04/07/2021  OCCUPATIONAL THERAPY DISCHARGE SUMMARY  Pt is discharging from outpatient OT at this time d/t medical decline and change in medical status.  Plan: Patient agrees to discharge.  Patient goals were not met. Patient is being discharged due to a change in medical status.  ?????         Zachery Conch  MOT, OTR/L  04/10/2021, 12:02 PM  [facility] [address] [place], [place], [address] Phone: [phone]   Fax:  [phone]

## 2021-04-10 NOTE — Progress Notes (Signed)
Physical Therapy Treatment Patient Details Name: Howard White MRN: [id number] DOB: 05/12/1961 Today's Date: 04/10/2021    History of Present Illness 60 yo male presenting to ED due to 2-3 falls and BLE weakness. MRI  acute small vessel infarct involving R corona radiata and basal ganglia. PMH including astrocytoma with left frontal craniotomy resection 20 years ago, seizure disorder, hypothyroidism, history of CVA with [name] in November 2021, spastic bladder, and ambulatory dysfunction.    PT Comments    Noted increased incoordination of R UE/LE and poor sitting balance noted this session. Patient requiring more assistance to maintain static sitting balance this session. Patient with difficulty following commands for upright posture. Patient pushing heavily with R UE to L and posteriorly. Deferred further mobility due to poor sitting balance and safety concerns. Patient anxious, restless, and at times impulsive during session. Notified MD about changes observed. Continue to recommend comprehensive inpatient rehab (CIR) for post-acute therapy needs.    Follow Up Recommendations  CIR     Equipment Recommendations  None recommended by PT    Recommendations for Other Services       Precautions / Restrictions Precautions Precautions: Fall Restrictions Weight Bearing Restrictions: No    Mobility  Bed Mobility Overal bed mobility: Needs Assistance Bed Mobility: Supine to Sit;Sit to Supine     Supine to sit: Mod assist;+2 for physical assistance;+2 for safety/equipment Sit to supine: Max assist;+2 for physical assistance;+2 for safety/equipment   General bed mobility comments: Difficulty sequencing and required increased time and assistance this session. incoordination of R UE and LE noted during transitions. Cues for deep breathing and relaxation to calm patient    Transfers Overall transfer level: Needs assistance Equipment used: 1 person hand held assist Transfers:  Sit to/from Stand;Stand Pivot Transfers Sit to Stand: Max assist;+2 physical assistance;+2 safety/equipment Stand pivot transfers: Total assist;+2 physical assistance;+2 safety/equipment       General transfer comment: maxA+2 for power up and sequencing transfers as patient limited by spasticity of L UE/LE and incoordination of R UE/LE  Ambulation/Gait             General Gait Details: deferred due to poor sitting balance   Stairs             Wheelchair Mobility    Modified Rankin (Stroke Patients Only) Modified Rankin (Stroke Patients Only) Pre-Morbid Rankin Score: Moderately severe disability Modified Rankin: Severe disability     Balance Overall balance assessment: Needs assistance Sitting-balance support: Single extremity supported;Feet supported Sitting balance-Leahy Scale: Poor Sitting balance - Comments: required maxA to assist maintaining sitting balance but blocking L LE and placing on floor. At times, blocking of R LE to maintain sitting balance. Patient with posterior and L lateral lean requiring multimodal cueing for upright posture, however difficulty following commands. Postural control: Posterior lean;Left lateral lean Standing balance support: Single extremity supported Standing balance-Leahy Scale: Zero Standing balance comment: totalA to maintain standing EOB                            Cognition Arousal/Alertness: Lethargic;Awake/alert Behavior During Therapy: Restless;Impulsive;Anxious;Flat affect Overall Cognitive Status: Impaired/Different from baseline Area of Impairment: Attention;Memory;Following commands;Safety/judgement;Awareness;Problem solving                   Current Attention Level: Focused Memory: Decreased short-term memory Following Commands: Follows one step commands with increased time;Follows one step commands inconsistently Safety/Judgement: Decreased awareness of safety;Decreased awareness of  deficits Awareness:  Intellectual Problem Solving: Slow processing;Difficulty sequencing;Requires verbal cues;Requires tactile cues General Comments: Decreased attention this session compared to last with frequent cueing and increased time. Patient with increasing coordination deficits especially on R side this session requiring max cues and assistance to safely sit EOB      Exercises      General Comments        Pertinent Vitals/Pain Pain Assessment: Faces Faces Pain Scale: No hurt Pain Intervention(s): Monitored during session    Home Living     Available Help at Discharge: Family;Available 24 hours/day Type of Home: House              Prior Function            PT Goals (current goals can now be found in the care plan section) Acute Rehab PT Goals Patient Stated Goal: Get stronger PT Goal Formulation: With patient/family Time For Goal Achievement: 04/23/21 Potential to Achieve Goals: Good Progress towards PT goals: Not progressing toward goals - comment    Frequency    Min 4X/week      PT Plan Current plan remains appropriate    Co-evaluation PT/OT/SLP Co-Evaluation/Treatment: Yes Reason for Co-Treatment: Complexity of the patient's impairments (multi-system involvement);Necessary to address cognition/behavior during functional activity;For patient/therapist safety;To address functional/ADL transfers PT goals addressed during session: Balance;Mobility/safety with mobility        AM-PAC PT "6 Clicks" Mobility   Outcome Measure  Help needed turning from your back to your side while in a flat bed without using bedrails?: Total Help needed moving from lying on your back to sitting on the side of a flat bed without using bedrails?: Total Help needed moving to and from a bed to a chair (including a wheelchair)?: Total Help needed standing up from a chair using your arms (e.g., wheelchair or bedside chair)?: Total Help needed to walk in hospital room?:  Total Help needed climbing 3-5 steps with a railing? : Total 6 Click Score: 6    End of Session Equipment Utilized During Treatment: Gait belt Activity Tolerance: Patient limited by lethargy;Other (comment) (spasticity) Patient left: in bed;with call bell/phone within reach;with bed alarm set Nurse Communication: Mobility status PT Visit Diagnosis: Unsteadiness on feet (R26.81);Muscle weakness (generalized) (M62.81);Repeated falls (R29.6);Other abnormalities of gait and mobility (R26.89);Other symptoms and signs involving the nervous system (R29.898);Ataxic gait (R26.0);Hemiplegia and hemiparesis Hemiplegia - Right/Left: Left Hemiplegia - dominant/non-dominant: Non-dominant Hemiplegia - caused by: Cerebral infarction     Time: 4970-2637 PT Time Calculation (min) (ACUTE ONLY): 23 min  Charges:  $Therapeutic Activity: 8-22 mins                     Amy Gothard A. Gilford Rile PT, DPT Acute Rehabilitation Services Pager [phone] Office [phone]    Linna Hoff 04/10/2021, 1:33 PM

## 2021-04-10 NOTE — Procedures (Signed)
Patient Name: Howard White  MRN: [id number]  Epilepsy Attending: Lora Havens  Referring Physician/Provider: Tollie Eth, NP Date: 04/09/2021 Duration:   Patient history: 60 year old male with left frontal craniotomy about 20 years ago, history of seizures who presented after multiple falls and excessive drowsiness.  EEG to evaluate for seizures.  Level of alertness: Awake,asleep  AEDs during EEG study: LTG  Technical aspects: This EEG study was done with scalp electrodes positioned according to the 10-20 International system of electrode placement. Electrical activity was acquired at a sampling rate of 500Hz  and reviewed with a high frequency filter of 70Hz  and a low frequency filter of 1Hz . EEG data were recorded continuously and digitally stored.   Description: The posterior dominant rhythm consists of 8 Hz activity of moderate voltage (25-35 uV) seen predominantly in posterior head regions, symmetric and reactive to eye opening and eye closing.  Sleep was characterized by vertex waves, sleep spindles (12 to 14 Hz), maximal frontocentral region.  EEG showed continuous generalized sharply contoured 3 to 5 Hz theta-delta slowing with overriding 15 to 18 Hz beta activity admixed with left frontocentral spikes, maximal F3/C3 consistent with underlying breach artifact.  Hyperventilation and photic stimulation were not performed.     ABNORMALITY -Spike, left frontocentral region  -Breach artifact, left frontocentral region  IMPRESSION: This study showed evidence of epileptogenicity and cortical dysfunction in left frontocentral region consistent with underlying craniotomy.  No seizures were seen throughout the recording.    Howard White

## 2021-04-10 NOTE — Progress Notes (Signed)
PROGRESS NOTE    Howard White  NUU:[id number] DOB: 11/26/1960 DOA: 04/08/2021 PCP: Seward Carol, MD   Brief Narrative:  HPI On 04/08/2021 by Dr. Imagene Sheller Howard White is a 60 y.o. male with medical history significant with history of astrocytoma with left frontal craniotomy resection 20 years ago, seizure disorder, hypothyroidism, history of CVA in November 2021, spastic bladder, ambulatory dysfunction.  The patient presented to the emergency department due to 2-3 falls while working with physical therapy today and bilateral lower extremity weakness.  Denies hitting his head.  Denies loss of consciousness. He has residual left-sided weakness of the upper and lower extremity since his previous stroke in November 2021. He was started on dual antiplatelet therapy with aspirin and Plavix for 3 weeks and then transitioned to aspirin alone.  He was also started on a high intensity statin therapy as well.  He was discharged to inpatient rehab therapy.  No slurred speech, no swallowing difficulties, no visual deficits, no cognitive deficits from his previous stroke.  Eating a regular diet.  Since previous stroke he was initially wheelchair-bound but with PT/OT has progressed to using a single pronged cane occasionally and able to get up out of the wheelchair to go to the bathroom or sit in a recliner at home.  Has been compliant with taking his medications.  His mother assists with his medications and puts them in a pill pack for him. The patient is asking if he can have something to drink because he is thirsty.  Has not had a formal follow evaluation or nursing swallow evaluation yet.  Neurology midlevel provider at bedside during my exam as well.  In the emergency department Shows relatively normal labs.  T head without contrast showed no acute findings.  MRI was positive for vessel infarct involving the right corona radiata and adjacent basal ganglia.  tPA was not given in the emergency department.  NIH  stroke scale upon my evaluation 2-3.  Interim history Patient admitted with CVA, pending workup and therapy evaluations. Assessment & Plan   Acute CVA -Patient presented with lower extremity weakness -CT head showed no acute finding -MRI brain showed acute small vessel infarct involving the right corona radiata and adjacent basal ganglia -Of note, patient did have CVA in November 2021 and has had residual left-sided weakness.  He was placed on DAPT at that time followed by aspirin alone. -Echocardiogram shows an EF of 60 to 34%, grade 1 diastolic dysfunction.  No regional wall motion abnormalities. -Carotid Doppler unremarkable for extracranial stenosis. -Transcranial Doppler shows no significant stenosis. -LDL 43, hemoglobin A1c 5.3 -Neurology consulted and appreciated- EEG ordered.  Recommended aspirin and Plavix for 3 weeks then aspirin alone -Currently on aspirin, Plavix, statin -PT OT recommending inpatient rehab  Possible urinary tract infection -UA showed positive nitrites, few bacteria, 0-5 WBCs -Urine culture pending -Currently on ceftriaxone  Hypothyroidism -Continue Synthroid  Seizure disorder -Stable, continue Lamictal -EEG pending  Depression/anxiety -Continue Lexapro  History of astrocytoma -With resection  DVT Prophylaxis  lovenox  Code Status: Full  Family Communication: none at bedside  Disposition Plan:  Status is: Inpatient  Remains inpatient appropriate because:Ongoing diagnostic testing needed not appropriate for outpatient work up and Inpatient level of care appropriate due to severity of illness   Dispo: The patient is from: Home              Anticipated d/c is to: [facility]              Patient  currently is not medically stable to d/c.   Difficult to place patient No   Consultants Neurology  Procedures  Echocardiogram  Antibiotics   Anti-infectives (From admission, onward)   Start     Dose/Rate Route Frequency Ordered Stop   04/10/21  1115  cephALEXin (KEFLEX) capsule 500 mg        500 mg Oral Every 12 hours 04/10/21 1017 04/13/21 0959   04/08/21 1600  cefTRIAXone (ROCEPHIN) 1 g in sodium chloride 0.9 % 100 mL IVPB  Status:  Discontinued        1 g 200 mL/hr over 30 Minutes Intravenous Every 24 hours 04/08/21 1528 04/10/21 1017      Subjective:   [name] seen and examined today.  Very anxious this morning.  No specific complaints.  Denies chest pain or shortness of breath, abdominal pain, dizziness or headache.   Objective:   Vitals:   04/10/21 0006 04/10/21 0334 04/10/21 0738 04/10/21 1119  BP: 110/73 111/75 132/80 117/75  Pulse: 70 62 72 94  Resp: 18 18 18 18   Temp: 97.6 F (36.4 C) 97.6 F (36.4 C) 98.3 F (36.8 C) 98.4 F (36.9 C)  TempSrc: Oral Oral Oral Oral  SpO2: 94% 96% 92% 92%  Weight:      Height:        Intake/Output Summary (Last 24 hours) at 04/10/2021 1252 Last data filed at 04/09/2021 2023 Gross per 24 hour  Intake 240 ml  Output 700 ml  Net -460 ml   Filed Weights   04/09/21 1800  Weight: 85.3 kg    Exam  General: Well developed, chronically ill-appearing, NAD, appears stated age  HEENT: NCAT, mucous membranes moist.   Cardiovascular: S1 S2 auscultated, RRR  Respiratory: Clear to auscultation bilaterally, no murmur  Abdomen: Soft, nontender, nondistended, + bowel sounds  Extremities: warm dry without cyanosis clubbing or edema  Neuro: AAOx3, LUE spastic, LLE strength 4/5.  RUE and right LE strength 5/5.  Skin: Without rashes exudates or nodules  Psych: very anxious   Data Reviewed: I have personally reviewed following labs and imaging studies  CBC: Recent Labs  Lab 04/08/21 0934 04/09/21 0053 04/10/21 0939  WBC 6.6 8.6 6.8  NEUTROABS 3.9  --  4.4  HGB 15.4 15.3 15.9  HCT 45.6 44.1 46.6  MCV 94.0 91.3 91.7  PLT 279 264 585   Basic Metabolic Panel: Recent Labs  Lab 04/08/21 0934 04/09/21 0053 04/10/21 0939  NA 137 134* 138  K 4.1 3.8 3.9  CL  103 102 104  CO2 24 24 28   GLUCOSE 97 121* 101*  BUN 17 13 13   CREATININE 1.08 1.16 1.04  CALCIUM 9.3 9.1 9.4   GFR: Estimated Creatinine Clearance: 79 mL/min (by C-G formula based on SCr of 1.04 mg/dL). Liver Function Tests: Recent Labs  Lab 04/08/21 0934 04/09/21 0053  AST 22 23  ALT 23 21  ALKPHOS 70 70  BILITOT 1.0 0.9  PROT 5.9* 5.9*  ALBUMIN 3.9 3.7   No results for input(s): LIPASE, AMYLASE in the last 168 hours. No results for input(s): AMMONIA in the last 168 hours. Coagulation Profile: No results for input(s): INR, PROTIME in the last 168 hours. Cardiac Enzymes: No results for input(s): CKTOTAL, CKMB, CKMBINDEX, TROPONINI in the last 168 hours. BNP (last 3 results) No results for input(s): PROBNP in the last 8760 hours. HbA1C: Recent Labs    04/09/21 0053  HGBA1C 5.5   CBG: Recent Labs  Lab 04/08/21 0952  GLUCAP  96   Lipid Profile: Recent Labs    04/09/21 0053  CHOL 101  HDL 48  LDLCALC 43  TRIG 50  CHOLHDL 2.1   Thyroid Function Tests: Recent Labs    04/09/21 0053  TSH 4.098   Anemia Panel: No results for input(s): VITAMINB12, FOLATE, FERRITIN, TIBC, IRON, RETICCTPCT in the last 72 hours. Urine analysis:    Component Value Date/Time   COLORURINE YELLOW 04/08/2021 [place] 04/08/2021 0934   LABSPEC 1.018 04/08/2021 0934   PHURINE 6.0 04/08/2021 0934   GLUCOSEU NEGATIVE 04/08/2021 0934   HGBUR NEGATIVE 04/08/2021 0934   BILIRUBINUR NEGATIVE 04/08/2021 0934   KETONESUR NEGATIVE 04/08/2021 0934   PROTEINUR NEGATIVE 04/08/2021 0934   NITRITE POSITIVE (A) 04/08/2021 0934   LEUKOCYTESUR NEGATIVE 04/08/2021 0934   Sepsis Labs: @LABRCNTIP (procalcitonin:4,lacticidven:4)  ) Recent Results (from the past 240 hour(s))  Urine culture     Status: Abnormal   Collection Time: 04/08/21  1:28 PM   Specimen: Urine, Random  Result Value Ref Range Status   Specimen Description URINE, RANDOM  Final   Special Requests   Final     NONE Performed at [facility], [address], [place], [place] [address]    Culture >=100,000 COLONIES/mL STAPHYLOCOCCUS HAEMOLYTICUS (A)  Final   Report Status 04/10/2021 FINAL  Final   Organism ID, Bacteria STAPHYLOCOCCUS HAEMOLYTICUS (A)  Final      Susceptibility   Staphylococcus haemolyticus - MIC*    CIPROFLOXACIN <=0.5 SENSITIVE Sensitive     GENTAMICIN <=0.5 SENSITIVE Sensitive     NITROFURANTOIN <=16 SENSITIVE Sensitive     OXACILLIN <=0.25 SENSITIVE Sensitive     TETRACYCLINE <=1 SENSITIVE Sensitive     VANCOMYCIN 1 SENSITIVE Sensitive     TRIMETH/SULFA <=10 SENSITIVE Sensitive     CLINDAMYCIN <=0.25 SENSITIVE Sensitive     RIFAMPIN <=0.5 SENSITIVE Sensitive     Inducible Clindamycin NEGATIVE Sensitive     * >=100,000 COLONIES/mL STAPHYLOCOCCUS HAEMOLYTICUS  Resp Panel by RT-PCR (Flu A&B, Covid) Nasopharyngeal Swab     Status: None   Collection Time: 04/08/21  1:51 PM   Specimen: Nasopharyngeal Swab; Nasopharyngeal(NP) swabs in vial transport medium  Result Value Ref Range Status   SARS Coronavirus 2 by RT PCR NEGATIVE NEGATIVE Final    Comment: (NOTE) SARS-CoV-2 target nucleic acids are NOT DETECTED.  The SARS-CoV-2 RNA is generally detectable in upper respiratory specimens during the acute phase of infection. The lowest concentration of SARS-CoV-2 viral copies this assay can detect is 138 copies/mL. A negative result does not preclude SARS-Cov-2 infection and should not be used as the sole basis for treatment or other patient management decisions. A negative result may occur with  improper specimen collection/handling, submission of specimen other than nasopharyngeal swab, presence of viral mutation(s) within the areas targeted by this assay, and inadequate number of viral copies(<138 copies/mL). A negative result must be combined with clinical observations, patient history, and epidemiological information. The expected result is Negative.  Fact Sheet for  Patients:  EntrepreneurPulse.com.au  Fact Sheet for Healthcare Providers:  IncredibleEmployment.be  This test is no t yet approved or cleared by the [place] FDA and  has been authorized for detection and/or diagnosis of SARS-CoV-2 by FDA under an Emergency Use Authorization (EUA). This EUA will remain  in effect (meaning this test can be used) for the duration of the COVID-19 declaration under Section 564(b)(1) of the Act, 21 U.S.C.section 360bbb-3(b)(1), unless the authorization is terminated  or revoked  sooner.       Influenza A by PCR NEGATIVE NEGATIVE Final   Influenza B by PCR NEGATIVE NEGATIVE Final    Comment: (NOTE) The Xpert Xpress SARS-CoV-2/FLU/RSV plus assay is intended as an aid in the diagnosis of influenza from Nasopharyngeal swab specimens and should not be used as a sole basis for treatment. Nasal washings and aspirates are unacceptable for Xpert Xpress SARS-CoV-2/FLU/RSV testing.  Fact Sheet for Patients: EntrepreneurPulse.com.au  Fact Sheet for Healthcare Providers: IncredibleEmployment.be  This test is not yet approved or cleared by the [place] FDA and has been authorized for detection and/or diagnosis of SARS-CoV-2 by FDA under an Emergency Use Authorization (EUA). This EUA will remain in effect (meaning this test can be used) for the duration of the COVID-19 declaration under Section 564(b)(1) of the Act, 21 U.S.C. section 360bbb-3(b)(1), unless the authorization is terminated or revoked.  Performed at [facility], [place] [address], [place], [place] [address]       Radiology Studies: MR ANGIO HEAD WO CONTRAST  Result Date: 04/09/2021 CLINICAL DATA:  Acute right basal ganglia/corona radiata infarct on MRI. EXAM: MRA HEAD WITHOUT CONTRAST TECHNIQUE: Angiographic images of the Circle of Willis were acquired using MRA technique without intravenous contrast.  COMPARISON:  Head and neck CTA 09/26/2020 FINDINGS: Anterior circulation: The internal carotid arteries are widely patent from skull base to carotid termini. ACAs and MCAs are patent without evidence of a proximal branch occlusion or significant proximal stenosis. No aneurysm identified. Posterior circulation: The visualized distal vertebral arteries are widely patent to the basilar with the right being dominant. The left PICA and right AICA appear dominant. Patent SCAs are seen bilaterally. The basilar artery is widely patent. There is a fetal origin of the left PCA. Both PCAs are patent with branch vessel atherosclerosis including a moderate to severe proximal left P3 stenosis as seen on the prior CTA, however there is no significant P1 or proximal P2 stenosis. No aneurysm is identified. Anatomic variants: Fetal left PCA. IMPRESSION: No major intracranial branch occlusion or flow limiting proximal stenosis. Electronically Signed   By: Logan Bores M.D.   On: 04/09/2021 16:21   MR ANGIO NECK WO CONTRAST  Result Date: 04/09/2021 CLINICAL DATA:  Acute right basal ganglia/corona radiata infarct on MRI. EXAM: MRA NECK WITHOUT CONTRAST TECHNIQUE: Angiographic images of the neck were acquired using MRA technique without intravenous contrast. Carotid stenosis measurements (when applicable) are obtained utilizing NASCET criteria, using the distal internal carotid diameter as the denominator. COMPARISON:  Head and neck CTA 09/26/2020 FINDINGS: Aortic arch: The aortic arch was not included on this noncontrast examination. The included portions of the brachiocephalic and subclavian arteries are widely patent. Right carotid system: Patent without evidence of stenosis or dissection. Left carotid system: Patent without evidence of stenosis or dissection. Vertebral arteries: Patent with antegrade flow bilaterally and with the right vertebral artery being dominant. No evidence of a significant stenosis or dissection.  IMPRESSION: Negative neck MRA. Electronically Signed   By: Logan Bores M.D.   On: 04/09/2021 16:31   EEG adult  Result Date: 04/10/2021 Lora Havens, MD     04/10/2021 10:17 AM Patient Name: Hajime Asfaw MRN: [id number] Epilepsy Attending: Lora Havens Referring Physician/Provider: Tollie Eth, NP Date: 04/09/2021 Duration: Patient history: 60 year old male with left frontal craniotomy about 20 years ago, history of seizures who presented after multiple falls and excessive drowsiness.  EEG to evaluate for seizures. Level of alertness: Awake,asleep AEDs during EEG study: LTG  Technical aspects: This EEG study was done with scalp electrodes positioned according to the 10-20 International system of electrode placement. Electrical activity was acquired at a sampling rate of 500Hz  and reviewed with a high frequency filter of 70Hz  and a low frequency filter of 1Hz . EEG data were recorded continuously and digitally stored. Description: The posterior dominant rhythm consists of 8 Hz activity of moderate voltage (25-35 uV) seen predominantly in posterior head regions, symmetric and reactive to eye opening and eye closing.  Sleep was characterized by vertex waves, sleep spindles (12 to 14 Hz), maximal frontocentral region.  EEG showed continuous generalized sharply contoured 3 to 5 Hz theta-delta slowing with overriding 15 to 18 Hz beta activity admixed with left frontocentral spikes, maximal F3/C3 consistent with underlying breach artifact.  Hyperventilation and photic stimulation were not performed.   ABNORMALITY -Spike, left frontocentral region -Breach artifact, left frontocentral region IMPRESSION: This study showed evidence of epileptogenicity and cortical dysfunction in left frontocentral region consistent with underlying craniotomy.  No seizures were seen throughout the recording. Lora Havens   ECHOCARDIOGRAM COMPLETE  Result Date: 04/08/2021    ECHOCARDIOGRAM REPORT   Patient Name:    LEVIE WAGES Date of Exam: 04/08/2021 Medical Rec #:  [id number]    Height:       70.0 in Accession #:    [id number]   Weight:       188.0 lb Date of Birth:  [birth year]   BSA:          2.033 m Patient Age:    50 years     BP:           137/79 mmHg Patient Gender: M            HR:           74 bpm. Exam Location:  Inpatient Procedure: 2D Echo, Cardiac Doppler and Color Doppler Indications:    CVA  History:        Patient has prior history of Echocardiogram examinations, most                 recent 09/27/2020. Stroke. Brain cancer.  Sonographer:    Dustin Flock Referring Phys: [id number] Leslee Home  Sonographer Comments: No subcostal window. IMPRESSIONS  1. Left ventricular ejection fraction, by estimation, is 60 to 65%. The left ventricle has normal function. The left ventricle has no regional wall motion abnormalities. Left ventricular diastolic parameters are consistent with Grade I diastolic dysfunction (impaired relaxation).  2. Right ventricular systolic function is normal. The right ventricular size is normal.  3. The mitral valve is normal in structure. Trivial mitral valve regurgitation. No evidence of mitral stenosis.  4. The aortic valve is normal in structure. Aortic valve regurgitation is not visualized. No aortic stenosis is present.  5. The inferior vena cava is normal in size with greater than 50% respiratory variability, suggesting right atrial pressure of 3 mmHg. FINDINGS  Left Ventricle: Left ventricular ejection fraction, by estimation, is 60 to 65%. The left ventricle has normal function. The left ventricle has no regional wall motion abnormalities. The left ventricular internal cavity size was normal in size. There is  no left ventricular hypertrophy. Left ventricular diastolic parameters are consistent with Grade I diastolic dysfunction (impaired relaxation). Normal left ventricular filling pressure. Right Ventricle: The right ventricular size is normal. No increase in right ventricular  wall thickness. Right ventricular systolic function is normal. Left Atrium: Left atrial size was normal in size. Right Atrium: Right  atrial size was normal in size. Pericardium: There is no evidence of pericardial effusion. Mitral Valve: The mitral valve is normal in structure. Trivial mitral valve regurgitation. No evidence of mitral valve stenosis. Tricuspid Valve: The tricuspid valve is normal in structure. Tricuspid valve regurgitation is not demonstrated. No evidence of tricuspid stenosis. Aortic Valve: The aortic valve is normal in structure. Aortic valve regurgitation is not visualized. No aortic stenosis is present. Pulmonic Valve: The pulmonic valve was normal in structure. Pulmonic valve regurgitation is not visualized. No evidence of pulmonic stenosis. Aorta: The aortic root is normal in size and structure. Venous: The inferior vena cava is normal in size with greater than 50% respiratory variability, suggesting right atrial pressure of 3 mmHg. IAS/Shunts: No atrial level shunt detected by color flow Doppler.  LEFT VENTRICLE PLAX 2D LVIDd:         4.60 cm  Diastology LVIDs:         3.00 cm  LV e' medial:    6.85 cm/s LV PW:         1.10 cm  LV E/e' medial:  7.6 LV IVS:        0.90 cm  LV e' lateral:   8.92 cm/s LVOT diam:     2.30 cm  LV E/e' lateral: 5.8 LV SV:         81 LV SV Index:   40 LVOT Area:     4.15 cm  RIGHT VENTRICLE RV Basal diam:  2.90 cm RV S prime:     14.00 cm/s TAPSE (M-mode): 1.9 cm LEFT ATRIUM             Index       RIGHT ATRIUM           Index LA diam:        3.30 cm 1.62 cm/m  RA Area:     14.30 cm LA Vol (A2C):   37.2 ml 18.30 ml/m RA Volume:   33.20 ml  16.33 ml/m LA Vol (A4C):   34.6 ml 17.02 ml/m LA Biplane Vol: 36.3 ml 17.85 ml/m  AORTIC VALVE LVOT Vmax:   102.00 cm/s LVOT Vmean:  62.500 cm/s LVOT VTI:    0.195 m  AORTA Ao Root diam: 3.30 cm MITRAL VALVE MV Area (PHT): 4.89 cm    SHUNTS MV Decel Time: 155 msec    Systemic VTI:  0.20 m MV E velocity: 51.80 cm/s   Systemic Diam: 2.30 cm MV A velocity: 60.40 cm/s MV E/A ratio:  0.86 Mihai Croitoru MD Electronically signed by Sanda Klein MD Signature Date/Time: 04/08/2021/4:28:55 PM    Final    VAS US CAROTID  Result Date: 04/09/2021 Carotid Arterial Duplex Study Patient Name:  RYNE MCTIGUE  Date of Exam:   04/09/2021 Medical Rec #: [id number]     Accession #:    [id number] Date of Birth: September 12, 1961    Patient Gender: M Patient Age:   59Y Exam Location:  [name] Procedure:      VAS US CAROTID Referring Phys: [id number] Leslee Home --------------------------------------------------------------------------------  Performing Technologist: Darlin Coco RDMS,RVT  Examination Guidelines: A complete evaluation includes B-mode imaging, spectral Doppler, color Doppler, and power Doppler as needed of all accessible portions of each vessel. Bilateral testing is considered an integral part of a complete examination. Limited examinations for reoccurring indications may be performed as noted.  Right Carotid Findings: +----------+--------+--------+--------+------------------+------------------+           PSV cm/sEDV cm/sStenosisPlaque DescriptionComments           +----------+--------+--------+--------+------------------+------------------+  CCA Prox  136     25                                intimal thickening +----------+--------+--------+--------+------------------+------------------+ CCA Distal93      20                                intimal thickening +----------+--------+--------+--------+------------------+------------------+ ICA Prox  71      18                                intimal thickening +----------+--------+--------+--------+------------------+------------------+ ICA Distal81      26                                                   +----------+--------+--------+--------+------------------+------------------+ ECA       119     18                                                    +----------+--------+--------+--------+------------------+------------------+ +----------+--------+-------+--------+-------------------+           PSV cm/sEDV cmsDescribeArm Pressure (mmHG) +----------+--------+-------+--------+-------------------+ [id number]                                         +----------+--------+-------+--------+-------------------+ +---------+--------+--+--------+--+---------+ VertebralPSV cm/s54EDV cm/s17Antegrade +---------+--------+--+--------+--+---------+  Left Carotid Findings: +----------+--------+--------+--------+------------------+------------------+           PSV cm/sEDV cm/sStenosisPlaque DescriptionComments           +----------+--------+--------+--------+------------------+------------------+ CCA Prox  100     20                                intimal thickening +----------+--------+--------+--------+------------------+------------------+ CCA Distal103     22                                intimal thickening +----------+--------+--------+--------+------------------+------------------+ ICA Prox  58      21                                intimal thickening +----------+--------+--------+--------+------------------+------------------+ ICA Distal68      23                                                   +----------+--------+--------+--------+------------------+------------------+ ECA       114     13                                intimal thickening +----------+--------+--------+--------+------------------+------------------+ +----------+--------+--------+--------+-------------------+           PSV cm/sEDV cm/sDescribeArm Pressure (mmHG) +----------+--------+--------+--------+-------------------+ [id number]                                         +----------+--------+--------+--------+-------------------+ +---------+--------+--+--------+-+---------+  VertebralPSV cm/s32EDV cm/s9Antegrade  +---------+--------+--+--------+-+---------+   Summary: Right Carotid: The extracranial vessels were near-normal with only minimal wall                thickening or plaque. Left Carotid: The extracranial vessels were near-normal with only minimal wall               thickening or plaque. Vertebrals: Bilateral vertebral arteries demonstrate antegrade flow. *See table(s) above for measurements and observations.     Preliminary    VAS [place] TRANSCRANIAL DOPPLER  Result Date: 04/09/2021  Transcranial Doppler Patient Name:  DARE SANGER  Date of Exam:   04/09/2021 Medical Rec #: [id number]     Accession #:    [id number] Date of Birth: [birth year]    Patient Gender: M Patient Age:   59Y Exam Location:  [facility] Procedure:      VAS [place] TRANSCRANIAL DOPPLER Referring Phys: [id number] Tollie Eth --------------------------------------------------------------------------------  Indications: Stroke. Limitations: Seizure disorder- patient unable to remain stationary for portions              of examination. Comparison Study: No prior studies. Performing Technologist: Darlin Coco RDMS,RVT  Examination Guidelines: A complete evaluation includes B-mode imaging, spectral Doppler, color Doppler, and power Doppler as needed of all accessible portions of each vessel. Bilateral testing is considered an integral part of a complete examination. Limited examinations for reoccurring indications may be performed as noted.  +----------+-------------+----------+-----------+-------+ RIGHT TCD Right VM (cm)Depth (cm)PulsatilityComment +----------+-------------+----------+-----------+-------+ MCA           40.00                 1.21            +----------+-------------+----------+-----------+-------+ ACA          -29.00                 0.97            +----------+-------------+----------+-----------+-------+ Term ICA      31.00                 1.58             +----------+-------------+----------+-----------+-------+ PCA           22.00                 1.16            +----------+-------------+----------+-----------+-------+ Opthalmic     30.00                 1.44            +----------+-------------+----------+-----------+-------+ ICA siphon    26.00                 1.45            +----------+-------------+----------+-----------+-------+ Vertebral    -24.00                 1.00            +----------+-------------+----------+-----------+-------+ Distal ICA    22.00                 1.02            +----------+-------------+----------+-----------+-------+  +----------+------------+----------+-----------+-------+ LEFT TCD  Left VM (cm)Depth (cm)PulsatilityComment +----------+------------+----------+-----------+-------+ MCA          44.00                 1.05            +----------+------------+----------+-----------+-------+  ACA          -27.00                0.72            +----------+------------+----------+-----------+-------+ Term ICA     29.00                 1.10            +----------+------------+----------+-----------+-------+ PCA          35.00                 0.96            +----------+------------+----------+-----------+-------+ Opthalmic    24.00                 0.78            +----------+------------+----------+-----------+-------+ ICA siphon   29.00                 1.41            +----------+------------+----------+-----------+-------+ Vertebral    -19.00                0.77            +----------+------------+----------+-----------+-------+ Distal ICA   24.00                 0.95            +----------+------------+----------+-----------+-------+  +------------+-------+-------+             VM cm/sComment +------------+-------+-------+ Prox Basilar-38.00         +------------+-------+-------+ Dist Basilar-30.00         +------------+-------+-------+  +----------------------+----+ Right Lindegaard Ratio1.82 +----------------------+----+ +---------------------+----+ Left Lindegaard Ratio1.83 +---------------------+----+    Preliminary      Scheduled Meds: . aspirin EC  81 mg Oral Daily  . atorvastatin  40 mg Oral Daily  . baclofen  5 mg Oral TID  . calcium carbonate  1 tablet Oral Daily  . cephALEXin  500 mg Oral Q12H  . clopidogrel  75 mg Oral Daily  . docusate sodium  200 mg Oral BID  . enoxaparin (LOVENOX) injection  40 mg Subcutaneous Daily  . escitalopram  10 mg Oral Daily  . famotidine  20 mg Oral Daily  . lamoTRIgine  225 mg Oral q morning  . lamoTRIgine  300 mg Oral QPM  . levothyroxine  112 mcg Oral QHS  . melatonin  6 mg Oral QHS  . multivitamin with minerals  1 tablet Oral Daily  . polyethylene glycol  17 g Oral Daily   Continuous Infusions:    LOS: 2 days   Time Spent in minutes   30 minutes  Melony Tenpas D.O. on 04/10/2021 at 12:52 PM  Between 7am to 7pm - Please see pager noted on [email or web address]  After 7pm go to [email or web address]  And look for the night coverage person covering for me after hours  [facility] Office  [phone]

## 2021-04-10 NOTE — Evaluation (Addendum)
Speech Language Pathology Evaluation Patient Details Name: Howard White MRN: [id number] DOB: 04-Jul-1961 Today's Date: 04/10/2021 Time: 1740-8144 SLP Time Calculation (min) (ACUTE ONLY): 23 min  Problem List:  Patient Active Problem List   Diagnosis Date Noted  . History of CVA with residual deficit   . Neurogenic bladder   . History of astrocytoma   . Acute CVA (cerebrovascular accident) ([place]) 04/08/2021  . Hypothyroidism 04/08/2021  . Ambulatory dysfunction 04/08/2021  . Complicated UTI (urinary tract infection) 04/08/2021  . Slow transit constipation   . Sleep disturbance   . Basal ganglia stroke ([place])   . Incontinence of feces   . Leukocytosis   . Seizures ([place])   . Spastic hemiparesis ([place])   . Cerebrovascular accident (CVA) of right basal ganglia ([place]) 09/30/2020  . CVA (cerebral vascular accident) ([place]) 09/26/2020  . Gait disorder 10/12/2016  . Memory loss 10/12/2016  . Epilepsy ([place]) 03/30/2013  . Malignant neoplasm of brain ([place]) 03/30/2013  . 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 Brain cancer ([place])   . Seizures ([place])   . Thyroid disease    Hypothyroid   Past Surgical History:  Past Surgical History:  Procedure Laterality Date  . brain cancer    . BRAIN SURGERY     HPI:  60 yo male presenting to ED due to 2-3 falls and BLE weakness. MRI (04/08/21)  acute small vessel infarct involving R corona radiata and basal ganglia. Passed yale 04/08/21 and placed on regular diet. Mother concerned for aspiration on 04/10/21 when SLP arrived for SLE. 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   Assessment / Plan / Recommendation Clinical Impression  Pt presents with  cognitive and motor speech impairments post CVA, progression from baseline deficits. He is alert and oriented x2 (person, place) and partially oriented to situation and time. Spastic movements proved very distractible to pt, resulting in reduced sustained attention for completion of more comprehensive evaluation. While motor speech is reported by mother to be impaired at baseline, she states that it appears worse since recent stroke. During informal conversation, pt communicated in lengthy sentences demonstrating reduced breath support for speech, low vocal intensity and articulatory imprecision, resulting in  50-75% intelligibility. He was inconsistently aware of speech errors, but spontaneously utilized slow rate of speech and overarticulation in x2 instances which improved clarity of speech. Difficult to assess memory and executive functions due to fleeting attention, but plan to complete further diagnostic treatment throughout ST services as indicated. Will initiate treatment for impaired attention, orientation and motor speech.    SLP Assessment  SLP Recommendation/Assessment: Patient needs continued Speech Lanaguage Pathology Services SLP Visit Diagnosis: Cognitive communication deficit (R41.841);Dysarthria and anarthria (R47.1)    Follow Up Recommendations  Inpatient Rehab    Frequency and Duration min 2x/week  2 weeks      SLP Evaluation Cognition  Overall Cognitive Status: Impaired/Different from baseline Arousal/Alertness: Awake/alert Orientation Level: Oriented to person;Oriented to place;Disoriented to situation;Disoriented to time Attention: Sustained Sustained Attention: Impaired Sustained Attention Impairment: Functional basic;Verbal basic Memory:  (difficult to assess) Behaviors: Restless Safety/Judgment: Impaired       Comprehension  Auditory Comprehension Overall Auditory Comprehension: Appears within functional limits for tasks assessed Visual  Recognition/Discrimination Discrimination: Not tested Reading Comprehension Reading Status: Not  tested    Expression Expression Primary Mode of Expression: Verbal Verbal Expression Overall Verbal Expression: Appears within functional limits for tasks assessed Written Expression Dominant Hand: Right Written Expression: Not tested   Oral / Motor  Oral Motor/Sensory Function Overall Oral Motor/Sensory Function: Mild impairment Facial ROM: Reduced left;Suspected CN VII (facial) dysfunction Facial Symmetry: Abnormal symmetry left;Suspected CN VII (facial) dysfunction Facial Strength: Within Functional Limits Lingual Symmetry: Suspected CN XII (hypoglossal) dysfunction;Abnormal symmetry right Motor Speech Overall Motor Speech: Impaired Respiration: Impaired Level of Impairment: Word Phonation: Low vocal intensity Articulation: Impaired Level of Impairment: Word Intelligibility: Intelligibility reduced Word: 75-100% accurate Phrase: 50-74% accurate Sentence: 50-74% accurate Conversation: 25-49% accurate Motor Planning: Not tested Motor Speech Errors: Unaware Effective Techniques: Slow rate;Increased vocal intensity;Over-articulate   GO                   Howard White, [place], [place] Office Number: [phone]  Howard White 04/10/2021, 11:56 AM

## 2021-04-10 NOTE — Care Management Important Message (Signed)
Important Message  Patient Details  Name: Howard White MRN: [id number] Date of Birth: 1960-12-21   Medicare Important Message Given:  Yes     Vergia Chea Montine Circle 04/10/2021, 12:54 PM

## 2021-04-10 NOTE — Evaluation (Signed)
Clinical/Bedside Swallow Evaluation Patient Details  Name: Howard White MRN: [id number] Date of Birth: 1961/07/04  Today's Date: 04/10/2021 Time: SLP Start Time (ACUTE ONLY): 74 SLP Stop Time (ACUTE ONLY): 1101 SLP Time Calculation (min) (ACUTE ONLY): 20 min  Past Medical History:  Past Medical History:  Diagnosis Date  . Brain cancer ([place])   . Seizures ([place])   . Thyroid disease    Hypothyroid   Past Surgical History:  Past Surgical History:  Procedure Laterality Date  . brain cancer    . BRAIN SURGERY     HPI:  60 yo male presenting to ED due to 2-3 falls and BLE weakness. MRI (04/08/21)  acute small vessel infarct involving R corona radiata and basal ganglia. Passed yale 04/08/21 and placed on regular diet. Mother concerned for aspiration on 04/10/21 when SLP arrived for SLE. 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   Assessment / Plan / Recommendation Clinical Impression  Pt anxious and very distractible this am with frequent spastic movements, requiring frequent cueing to sustain attention to PO trials. Oral mechanism examination significant for L facial asymmetry and lingual deviation to the R, suspect cranial nerve VII and XII dysfunction. Thin liquids via straw sips resulted in no overt s/sx of aspiration initially, but as pt became increasingly anxious and spastic movements worsened, overt coughing in x2 trials observed. Solids also consumed without overt difficulty, however with reduced attention due to above noted factors, mild pocketing in  L buccal cavity and L anterior spillage noted x1 each. Recommend pt continue on regular/thin liquid diet with full supervision from staff during all meals to cue for upright positioning, small bites/sips at  slow rate, and alternate solids with liquids. Pt will require frequent cueing to remain attentive during PO intake to reduce risk for aspiration. Above discussed with pt's mother and RN who agreed to all recommendations. SLP to follow.  SLP Visit Diagnosis: Dysphagia, unspecified (R13.10)    Aspiration Risk  Mild aspiration risk    Diet Recommendation Regular;Thin liquid   Liquid Administration via: Straw Medication Administration: Whole meds with puree Supervision: Staff to assist with self feeding;Full supervision/cueing for compensatory strategies Compensations: Minimize environmental distractions;Slow rate;Small sips/bites;Follow solids with liquid Postural Changes: Seated upright at 90 degrees    Other  Recommendations Oral Care Recommendations: Oral care BID   Follow up Recommendations Inpatient Rehab      Frequency and Duration min 2x/week  2 weeks       Prognosis Prognosis for Safe Diet Advancement: Good Barriers to Reach Goals: Cognitive deficits      Swallow Study   General Date of Onset: 04/08/21 HPI: 60 yo male presenting to ED due to 2-3 falls and BLE weakness. MRI (04/08/21)  acute small vessel infarct involving R corona radiata and basal ganglia. Passed yale 04/08/21 and placed on regular diet. Mother concerned for aspiration on 04/10/21 when SLP arrived for SLE. 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 Type of Study: Bedside Swallow Evaluation Previous Swallow Assessment: none Diet Prior to this Study: Regular;Thin liquids Temperature Spikes Noted: No Respiratory Status: Room air History of Recent Intubation: No Behavior/Cognition: Alert;Cooperative;Requires cueing;Other (Comment);Distractible Oral Cavity  Assessment: Within  Functional Limits Oral Care Completed by SLP: No Oral Cavity - Dentition: Adequate natural dentition Vision: Functional for self-feeding Self-Feeding Abilities: Able to feed self;Needs assist Patient Positioning: Upright in bed;Postural control interferes with function Baseline Vocal Quality: Low vocal intensity Volitional Cough: Strong Volitional Swallow: Unable to elicit    Oral/Motor/Sensory Function Overall Oral Motor/Sensory Function: Mild impairment Facial ROM: Reduced left;Suspected CN VII (facial) dysfunction Facial Symmetry: Abnormal symmetry left;Suspected CN VII (facial) dysfunction Facial Strength: Within Functional Limits Lingual Symmetry: Suspected CN XII (hypoglossal) dysfunction;Abnormal symmetry right   Ice Chips Ice chips: Not tested   Thin Liquid Thin Liquid: Impaired Presentation: Straw Oral Phase Impairments: Reduced labial seal Oral Phase Functional Implications: Left anterior spillage Pharyngeal  Phase Impairments: Cough - Immediate;Throat Clearing - Immediate    Nectar Thick Nectar Thick Liquid: Not tested   Honey Thick Honey Thick Liquid: Not tested   Puree Puree: Within functional limits Presentation: Spoon   Solid      Solid: Within functional limits Presentation: [place], [place], [place] Office Number: [phone]  [name] 04/10/2021,11:37 AM

## 2021-04-10 NOTE — Progress Notes (Signed)
Inpatient Rehabilitation Admissions Coordinator  I met with patient and his Mom at bedside.e He has previously admitted to [facility] and would prefer CIR admit again. He is  a great candidate for CIR with great family support. I will begin insurance Auth with [employer] for a possible admit Monday pending insurance approval.  Danne Baxter, RN, MSN Rehab Admissions Coordinator [phone] 04/10/2021 12:44 PM

## 2021-04-10 NOTE — Progress Notes (Signed)
STROKE TEAM PROGRESS NOTE   INTERVAL HISTORY Mother at bedside.  The patient complained of increasing tremor last night and was seen by Dr. Leonel White.  He does seem to be anxious and his tremors can be suppressed during he relaxes. Physical therapist recommend inpatient rehab.  Carotid ultrasound shows no significant extracranial stenosis.  Transcranial Doppler studies show no significant stenosis.  TSH is normal.  EEG is done but results are pending      Vitals:   04/09/21 2009 04/10/21 0006 04/10/21 0334 04/10/21 0738  BP: 105/72 110/73 111/75 132/80  Pulse: 71 70 62 72  Resp: 18 18 18 18   Temp: 98.2 F (36.8 C) 97.6 F (36.4 C) 97.6 F (36.4 C) 98.3 F (36.8 C)  TempSrc: Oral Oral Oral Oral  SpO2: 96% 94% 96% 92%  Weight:      Height:       CBC:  Recent Labs  Lab 04/08/21 0934 04/09/21 0053  WBC 6.6 8.6  NEUTROABS 3.9  --   HGB 15.4 15.3  HCT 45.6 44.1  MCV 94.0 91.3  PLT 279 209   Basic Metabolic Panel:  Recent Labs  Lab 04/08/21 0934 04/09/21 0053  NA 137 134*  K 4.1 3.8  CL 103 102  CO2 24 24  GLUCOSE 97 121*  BUN 17 13  CREATININE 1.08 1.16  CALCIUM 9.3 9.1   Lipid Panel:  Recent Labs  Lab 04/09/21 0053  CHOL 101  TRIG 50  HDL 48  CHOLHDL 2.1  VLDL 10  LDLCALC 43   HgbA1c:  Recent Labs  Lab 04/09/21 0053  HGBA1C 5.5   Urine Drug Screen: No results for input(s): LABOPIA, COCAINSCRNUR, LABBENZ, AMPHETMU, THCU, LABBARB in the last 168 hours.  Alcohol Level No results for input(s): ETH in the last 168 hours.  IMAGING past 24 hours CT Head Wo Contrast Result Date: 04/08/2021 IMPRESSION:  1. No acute finding.  2. Remote left cerebral glioma resection. 3. 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   MR BRAIN WO CONTRAST Result Date: 04/08/2021  IMPRESSION: Motion degraded study. Acute small vessel infarct involving right corona radiata and adjacent basal ganglia.   ECHOCARDIOGRAM COMPLETE Result Date:  04/08/2021 IMPRESSIONS   1. Left ventricular ejection fraction, by estimation, is 60 to 65%. The left ventricle has normal function. The left ventricle has no regional wall motion abnormalities. Left ventricular diastolic parameters are consistent with Grade I diastolic dysfunction (impaired relaxation).   2. Right ventricular systolic function is normal. The right ventricular size is normal.   3. The mitral valve is normal in structure. Trivial mitral valve regurgitation. No evidence of mitral stenosis.   4. The aortic valve is normal in structure. Aortic valve regurgitation is not visualized. No aortic stenosis is present.   5. The inferior vena cava is normal in size with greater than 50% respiratory variability, suggesting right atrial pressure of 3 mmHg.   PHYSICAL EXAM Constitutional: Appears well-developed and well-nourished, left sided spasticity noted, arm>leg.  Psych: Affect appropriate to situation Eyes: No scleral injection HENT: No oropharyngeal obstruction.  MSK: no joint deformities.  Cardiovascular: Normal rate and regular rhythm.  Respiratory: Effort normal, non-labored breathing GI: Soft.  No distension. There is no tenderness.  Skin: Warm dry and intact visible skin  Neuro: Mental Status: Patient is awake, alert, oriented to person, place, month, year, and situation.  He appears anxious. Patient is able to give a clear and coherent history.  No signs of aphasia or neglect  Cranial Nerves: II: Visual Fields are full. Pupils are equal, round, and reactive to light.    III,IV, VI: EOMI without ptosis or diploplia.  V: Facial sensation is symmetric to temperature VII: Facial movement is symmetric.  VIII: hearing is intact to voice X: Uvula elevates symmetrically XI: Shoulder shrug is symmetric. XII: tongue is midline without atrophy or fasciculations.   Motor: Tone is normal. Bulk is normal. 5/5 strength was present at right side. Left arm spastic from prior strokes  4/5, hand grip weak 3/5.  Increased tone in the left upper and lower extremity with few beats of clonus.  Left leg strength appears 4+/5 with increased tone.   Involuntary tremulousness in the right upper and lower extremity which can do voluntarily suppress when he relaxes. Sensory: Sensation is symmetric to light touch and temperature in the arms and legs.  Cerebellar: FNF and HKS are intact bilaterally   ASSESSMENT/PLAN [name] is a 60 y.o. 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   Stroke:  Right basal ganglia/corona radiata infarct secondary to small vessel disease source likely.  History of previous right subcortical infarct in November 2021.  Remote history of astrocytoma resection the left frontal region.  CT head No acute finding. Remote left cerebral glioma resection. 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  MRI: acute small vessel infarct involving right corona radiata and adjacent basal ganglia. MRA  No major intracranial branch occlusion or flow limiting proximal  stenosis.  Carotid Doppler: pending  TCDs: pending  2D Echo EF: 61-95%, grade I diastolic dysfunction  SARs Coronavirus: negative  LDL 43  HgbA1c 5.4  VTE prophylaxis - Lovenox  Diet: Regular   Aspirin prior to admission, now on aspirin 81 mg daily and clopidogrel 75 mg daily. Continue DAPT for 3 weeks, and then ASA alone.  Ongoing aggressive stroke risk factor management  Therapy recommendations:  Rehab  Disposition:  pending  Stroke History  09/26/2020 acute infarct right BG/CR likely due to small vessel disease.  DAPT for 3 weeks and then aspirin alone.  LDL 131 and initiate atorvastatin 40  mg daily.  No history or evidence of HTN or DM.  Other stroke risk factors include EtOH use but no prior stroke history.  Other active problems include history of seizures on Lamictal and astrocytoma s/p craniotomy with radiation 20 years ago.  Residual left hemiparesis and was discharged to CIR for ongoing therapy needs per therapy recommendations.  Seizure History  Mother found him sleeping, confused, not active as usual.  This event resembled seizures he had previously.  Routine EEG to evaluate for seizures  Lamital 225mg  in the morning, 300mg  in the evening  Monitor for seizure activity  Hyperlipidemia  Home meds:  Lipitor 40 mg daily, resumed in hospital  LDL 43, goal < 70  Continue statin at discharge   Other Stroke Risk Factors  Hx stroke: 09/27/2020 right BG/CR likely due to small vessel disease. Left sided residual weakness.   Other Active Problems  Hx of Seizure - Lamictal  Astrocytoma s/p left frontal cranio with radiation 20 years ago  Hypothyroidism: synthroid 112 mcg daily  Anxiety/depression: Lexapro 10mg  daily started 04/10/2021  Hospital day # 2     He presented with increasing left-sided weakness due to right basal ganglia infarct from  small vessel disease.  Recommend aspirin and Plavix for 3 weeks followed by aspirin alone and aggressive risk factor modification.  Continue Lamictal for seizures and check EEG results.  Trial of Lexapro for her underlying anxiety/depression..  Long discussion with patient and mother and answered questions.  Transfer to inpatient rehab when bed available.  Discussed with Dr. Ree Kida.  Greater than 50% time during this 25-minute visit was spent in counseling and coordination of care about lacunar stroke and seizures and answering questions. Stroke team will sign off.  Kindly call for questions. Antony Contras, MD Medical Director [place] Pager: [phone] 04/10/2021 8:41 AM  To contact Stroke Continuity  provider, please refer to [email or web address] After hours, contact General Neurology

## 2021-04-11 MED ORDER — LORAZEPAM 0.5 MG PO TABS
0.5000 mg | ORAL_TABLET | Freq: Four times a day (QID) | ORAL | Status: DC | PRN
  Administered 2021-04-11 – 2021-04-14 (×6): 0.5 mg via ORAL
  Filled 2021-04-11 (×6): qty 1

## 2021-04-11 MED ORDER — HYDROXYZINE HCL 25 MG PO TABS
25.0000 mg | ORAL_TABLET | Freq: Three times a day (TID) | ORAL | Status: DC
  Administered 2021-04-11 – 2021-04-14 (×8): 25 mg via ORAL
  Filled 2021-04-11 (×8): qty 1

## 2021-04-11 MED ORDER — LORAZEPAM 2 MG/ML IJ SOLN: 0.5000 mg | Freq: Four times a day (QID) | INTRAMUSCULAR | Status: DC | PRN

## 2021-04-11 NOTE — Progress Notes (Signed)
PROGRESS NOTE    Howard White  NOB:[id number] DOB: [birth year] DOA: 04/08/2021 PCP: Seward Carol, MD   Brief Narrative:  HPI On 04/08/2021 by Dr. Imagene Sheller Howard White is a 60 y.o. male with medical history significant with history of astrocytoma with left frontal craniotomy resection 20 years ago, seizure disorder, hypothyroidism, history of CVA in November 2021, spastic bladder, ambulatory dysfunction.  The patient presented to the emergency department due to 2-3 falls while working with physical therapy today and bilateral lower extremity weakness.  Denies hitting his head.  Denies loss of consciousness. He has residual left-sided weakness of the upper and lower extremity since his previous stroke in November 2021. He was started on dual antiplatelet therapy with aspirin and Plavix for 3 weeks and then transitioned to aspirin alone.  He was also started on a high intensity statin therapy as well.  He was discharged to inpatient rehab therapy.  No slurred speech, no swallowing difficulties, no visual deficits, no cognitive deficits from his previous stroke.  Eating a regular diet.  Since previous stroke he was initially wheelchair-bound but with PT/OT has progressed to using a single pronged cane occasionally and able to get up out of the wheelchair to go to the bathroom or sit in a recliner at home.  Has been compliant with taking his medications.  His mother assists with his medications and puts them in a pill pack for him. The patient is asking if he can have something to drink because he is thirsty.  Has not had a formal follow evaluation or nursing swallow evaluation yet.  Neurology midlevel provider at bedside during my exam as well.  In the emergency department Shows relatively normal labs.  T head without contrast showed no acute findings.  MRI was positive for vessel infarct involving the right corona radiata and adjacent basal ganglia.  tPA was not given in the emergency department.  NIH  stroke scale upon my evaluation 2-3.  Interim history Patient admitted with CVA. Pending CIR admission. Assessment & Plan   Acute CVA -Patient presented with lower extremity weakness -CT head showed no acute finding -MRI brain showed acute small vessel infarct involving the right corona radiata and adjacent basal ganglia -Of note, patient did have CVA in November 2021 and has had residual left-sided weakness.  He was placed on DAPT at that time followed by aspirin alone. -Echocardiogram shows an EF of 60 to 94%, grade 1 diastolic dysfunction.  No regional wall motion abnormalities. -Carotid Doppler unremarkable for extracranial stenosis. -Transcranial Doppler shows no significant stenosis. -LDL 43, hemoglobin A1c 5.3 -Neurology consulted and appreciated- EEG ordered.  Recommended aspirin and Plavix for 3 weeks then aspirin alone -Currently on aspirin, Plavix, statin -PT OT recommending inpatient rehab- pending bed availability   Possible urinary tract infection -UA showed positive nitrites, few bacteria, 0-5 WBCs -Urine culture >100k Staphylococcus haemolyticus (pansensitive) -was placed on ceftriaxone- transitioned to keflex  Hypothyroidism -Continue Synthroid  Seizure disorder -Stable, continue Lamictal -EEG showed evidence of epileptogenicity and cortical dysfunction left frontocentral region consistent with underlying craniotomy.  No seizures were seen throughout recording  Depression/anxiety -Continue Lexapro -have scheduled atarax and added ativan PRN for anxiety   History of astrocytoma -With resection  DVT Prophylaxis  lovenox  Code Status: Full  Family Communication: none at bedside  Disposition Plan:  Status is: Inpatient  Remains inpatient appropriate because:Ongoing diagnostic testing needed not appropriate for outpatient work up and Inpatient level of care appropriate due to severity of illness  Dispo: The patient is from: Home              Anticipated  d/c is to: CIR              Patient currently is not medically stable to d/c.   Difficult to place patient No   Consultants Neurology CIR  Procedures  Echocardiogram EEG  Antibiotics   Anti-infectives (From admission, onward)   Start     Dose/Rate Route Frequency Ordered Stop   04/10/21 1115  cephALEXin (KEFLEX) capsule 500 mg        500 mg Oral Every 12 hours 04/10/21 1017 04/13/21 0959   04/08/21 1600  cefTRIAXone (ROCEPHIN) 1 g in sodium chloride 0.9 % 100 mL IVPB  Status:  Discontinued        1 g 200 mL/hr over 30 Minutes Intravenous Every 24 hours 04/08/21 1528 04/10/21 1017      Subjective:   Howard White seen and examined today.  Anxious this morning.  Complains of pain and asking for extra strength Tylenol.  Denies chest pain or shortness of breath, abdominal pain, dizziness or headache.    Objective:   Vitals:   04/11/21 0018 04/11/21 0434 04/11/21 0805 04/11/21 1105  BP: 100/74 130/76 120/73 116/74  Pulse: 75 70 67 74  Resp: 20 19 18 18   Temp: 97.8 F (36.6 C) 98.2 F (36.8 C) 97.6 F (36.4 C) 98.1 F (36.7 C)  TempSrc: Oral Oral Oral Oral  SpO2: 94% 95% 95% 91%  Weight:      Height:        Intake/Output Summary (Last 24 hours) at 04/11/2021 1237 Last data filed at 04/11/2021 0900 Gross per 24 hour  Intake 560 ml  Output 900 ml  Net -340 ml   Filed Weights   04/09/21 1800  Weight: 85.3 kg    Exam  General: Well developed, chronically ill-appearing, NAD, appears stated age  HEENT: NCAT, mucous membranes moist.   Cardiovascular: S1 S2 auscultated, RRR  Respiratory: Clear to auscultation bilaterally, no murmur  Abdomen: Soft, nontender, nondistended, + bowel sounds  Extremities: warm dry without cyanosis clubbing or edema  Neuro: AAOx3, LUE spastic, LLE strength 4/5.  RUE and right LE strength 5/5.  Skin: Without rashes exudates or nodules  Psych: anxious   Data Reviewed: I have personally reviewed following labs and imaging  studies  CBC: Recent Labs  Lab 04/08/21 0934 04/09/21 0053 04/10/21 0939  WBC 6.6 8.6 6.8  NEUTROABS 3.9  --  4.4  HGB 15.4 15.3 15.9  HCT 45.6 44.1 46.6  MCV 94.0 91.3 91.7  PLT 279 264 902   Basic Metabolic Panel: Recent Labs  Lab 04/08/21 0934 04/09/21 0053 04/10/21 0939  NA 137 134* 138  K 4.1 3.8 3.9  CL 103 102 104  CO2 24 24 28   GLUCOSE 97 121* 101*  BUN 17 13 13   CREATININE 1.08 1.16 1.04  CALCIUM 9.3 9.1 9.4   GFR: Estimated Creatinine Clearance: 79 mL/min (by C-G formula based on SCr of 1.04 mg/dL). Liver Function Tests: Recent Labs  Lab 04/08/21 0934 04/09/21 0053  AST 22 23  ALT 23 21  ALKPHOS 70 70  BILITOT 1.0 0.9  PROT 5.9* 5.9*  ALBUMIN 3.9 3.7   No results for input(s): LIPASE, AMYLASE in the last 168 hours. No results for input(s): AMMONIA in the last 168 hours. Coagulation Profile: No results for input(s): INR, PROTIME in the last 168 hours. Cardiac Enzymes: No results for input(s):  CKTOTAL, CKMB, CKMBINDEX, TROPONINI in the last 168 hours. BNP (last 3 results) No results for input(s): PROBNP in the last 8760 hours. HbA1C: Recent Labs    04/09/21 0053  HGBA1C 5.5   CBG: Recent Labs  Lab 04/08/21 0952  GLUCAP 96   Lipid Profile: Recent Labs    04/09/21 0053  CHOL 101  HDL 48  LDLCALC 43  TRIG 50  CHOLHDL 2.1   Thyroid Function Tests: Recent Labs    04/09/21 0053  TSH 4.098   Anemia Panel: No results for input(s): VITAMINB12, FOLATE, FERRITIN, TIBC, IRON, RETICCTPCT in the last 72 hours. Urine analysis:    Component Value Date/Time   COLORURINE YELLOW 04/08/2021 [place] 04/08/2021 0934   LABSPEC 1.018 04/08/2021 0934   PHURINE 6.0 04/08/2021 0934   GLUCOSEU NEGATIVE 04/08/2021 0934   HGBUR NEGATIVE 04/08/2021 0934   BILIRUBINUR NEGATIVE 04/08/2021 0934   KETONESUR NEGATIVE 04/08/2021 0934   PROTEINUR NEGATIVE 04/08/2021 0934   NITRITE POSITIVE (A) 04/08/2021 0934   LEUKOCYTESUR NEGATIVE  04/08/2021 0934   Sepsis Labs: @LABRCNTIP (procalcitonin:4,lacticidven:4)  ) Recent Results (from the past 240 hour(s))  Urine culture     Status: Abnormal   Collection Time: 04/08/21  1:28 PM   Specimen: Urine, Random  Result Value Ref Range Status   Specimen Description URINE, RANDOM  Final   Special Requests   Final    NONE Performed at [facility], [address], [place], [place] [address]    Culture >=100,000 COLONIES/mL STAPHYLOCOCCUS HAEMOLYTICUS (A)  Final   Report Status 04/10/2021 FINAL  Final   Organism ID, Bacteria STAPHYLOCOCCUS HAEMOLYTICUS (A)  Final      Susceptibility   Staphylococcus haemolyticus - MIC*    CIPROFLOXACIN <=0.5 SENSITIVE Sensitive     GENTAMICIN <=0.5 SENSITIVE Sensitive     NITROFURANTOIN <=16 SENSITIVE Sensitive     OXACILLIN <=0.25 SENSITIVE Sensitive     TETRACYCLINE <=1 SENSITIVE Sensitive     VANCOMYCIN 1 SENSITIVE Sensitive     TRIMETH/SULFA <=10 SENSITIVE Sensitive     CLINDAMYCIN <=0.25 SENSITIVE Sensitive     RIFAMPIN <=0.5 SENSITIVE Sensitive     Inducible Clindamycin NEGATIVE Sensitive     * >=100,000 COLONIES/mL STAPHYLOCOCCUS HAEMOLYTICUS  Resp Panel by RT-PCR (Flu A&B, Covid) Nasopharyngeal Swab     Status: None   Collection Time: 04/08/21  1:51 PM   Specimen: Nasopharyngeal Swab; Nasopharyngeal(NP) swabs in vial transport medium  Result Value Ref Range Status   SARS Coronavirus 2 by RT PCR NEGATIVE NEGATIVE Final    Comment: (NOTE) SARS-CoV-2 target nucleic acids are NOT DETECTED.  The SARS-CoV-2 RNA is generally detectable in upper respiratory specimens during the acute phase of infection. The lowest concentration of SARS-CoV-2 viral copies this assay can detect is 138 copies/mL. A negative result does not preclude SARS-Cov-2 infection and should not be used as the sole basis for treatment or other patient management decisions. A negative result may occur with  improper specimen collection/handling, submission of  specimen other than nasopharyngeal swab, presence of viral mutation(s) within the areas targeted by this assay, and inadequate number of viral copies(<138 copies/mL). A negative result must be combined with clinical observations, patient history, and epidemiological information. The expected result is Negative.  Fact Sheet for Patients:  EntrepreneurPulse.com.au  Fact Sheet for Healthcare Providers:  IncredibleEmployment.be  This test is no t yet approved or cleared by the [place] FDA and  has been authorized for detection and/or diagnosis of SARS-CoV-2  by FDA under an Emergency Use Authorization (EUA). This EUA will remain  in effect (meaning this test can be used) for the duration of the COVID-19 declaration under Section 564(b)(1) of the Act, 21 U.S.C.section 360bbb-3(b)(1), unless the authorization is terminated  or revoked sooner.       Influenza A by PCR NEGATIVE NEGATIVE Final   Influenza B by PCR NEGATIVE NEGATIVE Final    Comment: (NOTE) The Xpert Xpress SARS-CoV-2/FLU/RSV plus assay is intended as an aid in the diagnosis of influenza from Nasopharyngeal swab specimens and should not be used as a sole basis for treatment. Nasal washings and aspirates are unacceptable for Xpert Xpress SARS-CoV-2/FLU/RSV testing.  Fact Sheet for Patients: EntrepreneurPulse.com.au  Fact Sheet for Healthcare Providers: IncredibleEmployment.be  This test is not yet approved or cleared by the [place] FDA and has been authorized for detection and/or diagnosis of SARS-CoV-2 by FDA under an Emergency Use Authorization (EUA). This EUA will remain in effect (meaning this test can be used) for the duration of the COVID-19 declaration under Section 564(b)(1) of the Act, 21 U.S.C. section 360bbb-3(b)(1), unless the authorization is terminated or revoked.  Performed at [facility], [place] [address]  [place], [place] [address]       Radiology Studies: CT HEAD WO CONTRAST  Result Date: 04/10/2021 CLINICAL DATA:  Stroke follow-up.  Possible worsening balance. EXAM: CT HEAD WITHOUT CONTRAST TECHNIQUE: Contiguous axial images were obtained from the base of the skull through the vertex without intravenous contrast. COMPARISON:  Apr 08, 2021. FINDINGS: Motion degraded exam.  Within this limitation: Brain: No substantial change in edema associated with the recent right corona radiata/basal ganglia infarct seen on May 25th MRI. No evidence of interval acute large vascular territory infarct. No acute hemorrhage or progressive mass effect. Similar appearance of the left frontal CSF density resection cavity which bibasilar communicates with the left lateral ventricle, better characterized on recent MRI. No progressive ventriculomegaly. Similar patchy white matter hypodensities, which are nonspecific. Vascular: Calcific atherosclerosis without evidence of a hyperdense vessel. Skull: No acute fracture.  Prior left frontal craniotomy. Sinuses/Orbits: Visualized sinuses are largely clear. Small retention cyst in the right sphenoid sinus. Unremarkable orbits on limited assessment. Other: No mastoid effusions. IMPRESSION: 1. No substantial change in edema associated with the recent right corona radiata/basal ganglia infarct seen on May 25th MRI. No acute hemorrhage or progressive mass effect. 2. No evidence of interval/acute intracranial abnormality on this motion degraded exam. MRI could provide more sensitive evaluation for acute infarct if clinically indicated. Electronically Signed   By: Margaretha Sheffield MD   On: 04/10/2021 15:49   MR ANGIO HEAD WO CONTRAST  Result Date: 04/09/2021 CLINICAL DATA:  Acute right basal ganglia/corona radiata infarct on MRI. EXAM: MRA HEAD WITHOUT CONTRAST TECHNIQUE: Angiographic images of the Circle of Willis were acquired using MRA technique without intravenous contrast. COMPARISON:   Head and neck CTA 09/26/2020 FINDINGS: Anterior circulation: The internal carotid arteries are widely patent from skull base to carotid termini. ACAs and MCAs are patent without evidence of a proximal branch occlusion or significant proximal stenosis. No aneurysm identified. Posterior circulation: The visualized distal vertebral arteries are widely patent to the basilar with the right being dominant. The left PICA and right AICA appear dominant. Patent SCAs are seen bilaterally. The basilar artery is widely patent. There is a fetal origin of the left PCA. Both PCAs are patent with branch vessel atherosclerosis including a moderate to severe proximal left P3 stenosis as seen  on the prior CTA, however there is no significant P1 or proximal P2 stenosis. No aneurysm is identified. Anatomic variants: Fetal left PCA. IMPRESSION: No major intracranial branch occlusion or flow limiting proximal stenosis. Electronically Signed   By: Logan Bores M.D.   On: 04/09/2021 16:21   MR ANGIO NECK WO CONTRAST  Result Date: 04/09/2021 CLINICAL DATA:  Acute right basal ganglia/corona radiata infarct on MRI. EXAM: MRA NECK WITHOUT CONTRAST TECHNIQUE: Angiographic images of the neck were acquired using MRA technique without intravenous contrast. Carotid stenosis measurements (when applicable) are obtained utilizing NASCET criteria, using the distal internal carotid diameter as the denominator. COMPARISON:  Head and neck CTA 09/26/2020 FINDINGS: Aortic arch: The aortic arch was not included on this noncontrast examination. The included portions of the brachiocephalic and subclavian arteries are widely patent. Right carotid system: Patent without evidence of stenosis or dissection. Left carotid system: Patent without evidence of stenosis or dissection. Vertebral arteries: Patent with antegrade flow bilaterally and with the right vertebral artery being dominant. No evidence of a significant stenosis or dissection. IMPRESSION: Negative  neck MRA. Electronically Signed   By: Logan Bores M.D.   On: 04/09/2021 16:31   DG CHEST PORT 1 VIEW  Result Date: 04/10/2021 CLINICAL DATA:  60 year old with cough. EXAM: PORTABLE CHEST 1 VIEW COMPARISON:  11/04/2020 FINDINGS: Lungs are clear. Heart size is within normal limits and stable. Patient is mildly rotated towards the left. Bone structures are unremarkable. Negative for a pneumothorax. IMPRESSION: No active disease. Electronically Signed   By: Markus Daft M.D.   On: 04/10/2021 14:26   EEG adult  Result Date: 04/10/2021 Lora Havens, MD     04/10/2021 10:17 AM Patient Name: Howard White MRN: [id number] Epilepsy Attending: Lora Havens Referring Physician/Provider: Tollie Eth, NP Date: 04/09/2021 Duration: Patient history: 60 year old male with left frontal craniotomy about 20 years ago, history of seizures who presented after multiple falls and excessive drowsiness.  EEG to evaluate for seizures. Level of alertness: Awake,asleep AEDs during EEG study: LTG Technical aspects: This EEG study was done with scalp electrodes positioned according to the 10-20 International system of electrode placement. Electrical activity was acquired at a sampling rate of 500Hz  and reviewed with a high frequency filter of 70Hz  and a low frequency filter of 1Hz . EEG data were recorded continuously and digitally stored. Description: The posterior dominant rhythm consists of 8 Hz activity of moderate voltage (25-35 uV) seen predominantly in posterior head regions, symmetric and reactive to eye opening and eye closing.  Sleep was characterized by vertex waves, sleep spindles (12 to 14 Hz), maximal frontocentral region.  EEG showed continuous generalized sharply contoured 3 to 5 Hz theta-delta slowing with overriding 15 to 18 Hz beta activity admixed with left frontocentral spikes, maximal F3/C3 consistent with underlying breach artifact.  Hyperventilation and photic stimulation were not performed.    ABNORMALITY -Spike, left frontocentral region -Breach artifact, left frontocentral region IMPRESSION: This study showed evidence of epileptogenicity and cortical dysfunction in left frontocentral region consistent with underlying craniotomy.  No seizures were seen throughout the recording. Priyanka Barbra Sarks   VAS US CAROTID  Result Date: 04/10/2021 Carotid Arterial Duplex Study Patient Name:  Howard White  Date of Exam:   04/09/2021 Medical Rec #: [id number]     Accession #:    [id number] Date of Birth: 12-06-1960    Patient Gender: M Patient Age:   59Y Exam Location:  [facility] Procedure:      VAS US CAROTID  Referring Phys: [place] --------------------------------------------------------------------------------  Performing Technologist: Darlin Coco RDMS,RVT  Examination Guidelines: A complete evaluation includes B-mode imaging, spectral Doppler, color Doppler, and power Doppler as needed of all accessible portions of each vessel. Bilateral testing is considered an integral part of a complete examination. Limited examinations for reoccurring indications may be performed as noted.  Right Carotid Findings: +----------+--------+--------+--------+------------------+------------------+           PSV cm/sEDV cm/sStenosisPlaque DescriptionComments           +----------+--------+--------+--------+------------------+------------------+ CCA Prox  136     25                                intimal thickening +----------+--------+--------+--------+------------------+------------------+ CCA Distal93      20                                intimal thickening +----------+--------+--------+--------+------------------+------------------+ ICA Prox  71      18                                intimal thickening +----------+--------+--------+--------+------------------+------------------+ ICA Distal81      26                                                    +----------+--------+--------+--------+------------------+------------------+ ECA       119     18                                                   +----------+--------+--------+--------+------------------+------------------+ +----------+--------+-------+--------+-------------------+           PSV cm/sEDV cmsDescribeArm Pressure (mmHG) +----------+--------+-------+--------+-------------------+ [id number]                                         +----------+--------+-------+--------+-------------------+ +---------+--------+--+--------+--+---------+ VertebralPSV cm/s54EDV cm/s17Antegrade +---------+--------+--+--------+--+---------+  Left Carotid Findings: +----------+--------+--------+--------+------------------+------------------+           PSV cm/sEDV cm/sStenosisPlaque DescriptionComments           +----------+--------+--------+--------+------------------+------------------+ CCA Prox  100     20                                intimal thickening +----------+--------+--------+--------+------------------+------------------+ CCA Distal103     22                                intimal thickening +----------+--------+--------+--------+------------------+------------------+ ICA Prox  58      21                                intimal thickening +----------+--------+--------+--------+------------------+------------------+ ICA Distal68      23                                                   +----------+--------+--------+--------+------------------+------------------+  ECA       114     13                                intimal thickening +----------+--------+--------+--------+------------------+------------------+ +----------+--------+--------+--------+-------------------+           PSV cm/sEDV cm/sDescribeArm Pressure (mmHG) +----------+--------+--------+--------+-------------------+ [id number]                                          +----------+--------+--------+--------+-------------------+ +---------+--------+--+--------+-+---------+ VertebralPSV cm/s32EDV cm/s9Antegrade +---------+--------+--+--------+-+---------+   Summary: Right Carotid: The extracranial vessels were near-normal with only minimal wall                thickening or plaque. Left Carotid: The extracranial vessels were near-normal with only minimal wall               thickening or plaque. Vertebrals: Bilateral vertebral arteries demonstrate antegrade flow. *See table(s) above for measurements and observations.  Electronically signed by Antony Contras MD on 04/10/2021 at 1:50:46 PM.    Final    VAS [place] TRANSCRANIAL DOPPLER  Result Date: 04/10/2021  Transcranial Doppler Patient Name:  Howard White  Date of Exam:   04/09/2021 Medical Rec #: [id number]     Accession #:    [id number] Date of Birth: June 21, 1961    Patient Gender: M Patient Age:   59Y Exam Location:  [facility] Procedure:      VAS [place] TRANSCRANIAL DOPPLER Referring Phys: [id number] Tollie Eth --------------------------------------------------------------------------------  Indications: Stroke. Limitations: Seizure disorder- patient unable to remain stationary for portions              of examination. Comparison Study: No prior studies. Performing Technologist: Darlin Coco RDMS,RVT  Examination Guidelines: A complete evaluation includes B-mode imaging, spectral Doppler, color Doppler, and power Doppler as needed of all accessible portions of each vessel. Bilateral testing is considered an integral part of a complete examination. Limited examinations for reoccurring indications may be performed as noted.  +----------+-------------+----------+-----------+-------+ RIGHT TCD Right VM (cm)Depth (cm)PulsatilityComment +----------+-------------+----------+-----------+-------+ MCA           40.00                 1.21            +----------+-------------+----------+-----------+-------+  ACA          -29.00                 0.97            +----------+-------------+----------+-----------+-------+ Term ICA      31.00                 1.58            +----------+-------------+----------+-----------+-------+ PCA           22.00                 1.16            +----------+-------------+----------+-----------+-------+ Opthalmic     30.00                 1.44            +----------+-------------+----------+-----------+-------+ ICA siphon    26.00                 1.45            +----------+-------------+----------+-----------+-------+  Vertebral    -24.00                 1.00            +----------+-------------+----------+-----------+-------+ Distal ICA    22.00                 1.02            +----------+-------------+----------+-----------+-------+  +----------+------------+----------+-----------+-------+ LEFT TCD  Left VM (cm)Depth (cm)PulsatilityComment +----------+------------+----------+-----------+-------+ MCA          44.00                 1.05            +----------+------------+----------+-----------+-------+ ACA          -27.00                0.72            +----------+------------+----------+-----------+-------+ Term ICA     29.00                 1.10            +----------+------------+----------+-----------+-------+ PCA          35.00                 0.96            +----------+------------+----------+-----------+-------+ Opthalmic    24.00                 0.78            +----------+------------+----------+-----------+-------+ ICA siphon   29.00                 1.41            +----------+------------+----------+-----------+-------+ Vertebral    -19.00                0.77            +----------+------------+----------+-----------+-------+ Distal ICA   24.00                 0.95            +----------+------------+----------+-----------+-------+  +------------+-------+-------+             VM  cm/sComment +------------+-------+-------+ Prox Basilar-38.00         +------------+-------+-------+ Dist Basilar-30.00         +------------+-------+-------+ +----------------------+----+ Right Lindegaard Ratio1.82 +----------------------+----+ +---------------------+----+ Left Lindegaard Ratio1.83 +---------------------+----+  Summary: This was a normal transcranial Doppler study, with normal flow direction and velocity of all identified vessels of the anterior and posterior circulations, with no evidence of stenosis, vasospasm or occlusion. There was no evidence of intracranial disease. *See table(s) above for TCD measurements and observations.  Diagnosing physician: Antony Contras MD Electronically signed by Antony Contras MD on 04/10/2021 at 1:51:13 PM.    Final      Scheduled Meds: . aspirin EC  81 mg Oral Daily  . atorvastatin  40 mg Oral Daily  . baclofen  5 mg Oral TID  . calcium carbonate  1 tablet Oral Daily  . cephALEXin  500 mg Oral Q12H  . clopidogrel  75 mg Oral Daily  . docusate sodium  200 mg Oral BID  . enoxaparin (LOVENOX) injection  40 mg Subcutaneous Daily  . escitalopram  10 mg Oral Daily  . famotidine  20 mg Oral Daily  . hydrOXYzine  25 mg Oral TID  . lamoTRIgine  225 mg Oral q morning  . lamoTRIgine  300 mg Oral QPM  . levothyroxine  112 mcg Oral QHS  . melatonin  6 mg Oral QHS  . multivitamin with minerals  1 tablet Oral Daily  . polyethylene glycol  17 g Oral Daily   Continuous Infusions:    LOS: 3 days   Time Spent in minutes   30 minutes  Mariem Skolnick D.O. on 04/11/2021 at 12:37 PM  Between 7am to 7pm - Please see pager noted on [email or web address]  After 7pm go to [email or web address]  And look for the night coverage person covering for me after hours  [facility] Office  [phone]

## 2021-04-12 NOTE — Progress Notes (Signed)
PROGRESS NOTE    Howard White  DXA:[id number] DOB: 1961-05-31 DOA: 04/08/2021 PCP: Seward Carol, MD   Brief Narrative:  HPI On 04/08/2021 by Dr. Imagene Sheller Howard White is a 60 y.o. male with medical history significant with history of astrocytoma with left frontal craniotomy resection 20 years ago, seizure disorder, hypothyroidism, history of CVA in November 2021, spastic bladder, ambulatory dysfunction.  The patient presented to the emergency department due to 2-3 falls while working with physical therapy today and bilateral lower extremity weakness.  Denies hitting his head.  Denies loss of consciousness. He has residual left-sided weakness of the upper and lower extremity since his previous stroke in November 2021. He was started on dual antiplatelet therapy with aspirin and Plavix for 3 weeks and then transitioned to aspirin alone.  He was also started on a high intensity statin therapy as well.  He was discharged to inpatient rehab therapy.  No slurred speech, no swallowing difficulties, no visual deficits, no cognitive deficits from his previous stroke.  Eating a regular diet.  Since previous stroke he was initially wheelchair-bound but with PT/OT has progressed to using a single pronged cane occasionally and able to get up out of the wheelchair to go to the bathroom or sit in a recliner at home.  Has been compliant with taking his medications.  His mother assists with his medications and puts them in a pill pack for him. The patient is asking if he can have something to drink because he is thirsty.  Has not had a formal follow evaluation or nursing swallow evaluation yet.  Neurology midlevel provider at bedside during my exam as well.  In the emergency department Shows relatively normal labs.  T head without contrast showed no acute findings.  MRI was positive for vessel infarct involving the right corona radiata and adjacent basal ganglia.  tPA was not given in the emergency department.  NIH  stroke scale upon my evaluation 2-3.  Interim history Patient admitted with CVA. Pending CIR admission. Assessment & Plan   Acute CVA -Patient presented with lower extremity weakness -CT head showed no acute finding -MRI brain showed acute small vessel infarct involving the right corona radiata and adjacent basal ganglia -Of note, patient did have CVA in November 2021 and has had residual left-sided weakness.  He was placed on DAPT at that time followed by aspirin alone. -Echocardiogram shows an EF of 60 to 20%, grade 1 diastolic dysfunction.  No regional wall motion abnormalities. -Carotid Doppler unremarkable for extracranial stenosis. -Transcranial Doppler shows no significant stenosis. -LDL 43, hemoglobin A1c 5.3 -Neurology consulted and appreciated- EEG ordered.  Recommended aspirin and Plavix for 3 weeks then aspirin alone -Currently on aspirin, Plavix, statin -PT OT recommending inpatient rehab- pending bed availability   Possible urinary tract infection -UA showed positive nitrites, few bacteria, 0-5 WBCs -Urine culture >100k Staphylococcus haemolyticus (pansensitive) -was placed on ceftriaxone- transitioned to keflex through 5/30  Hypothyroidism -Continue Synthroid  Seizure disorder -Stable, continue Lamictal -EEG showed evidence of epileptogenicity and cortical dysfunction left frontocentral region consistent with underlying craniotomy.  No seizures were seen throughout recording  Depression/anxiety -Continue Lexapro -Continue scheduled atarax and added ativan PRN for anxiety   History of astrocytoma -history of resection  DVT Prophylaxis  lovenox  Code Status: Full  Family Communication: none at bedside  Disposition Plan:  Status is: Inpatient  Remains inpatient appropriate because:Ongoing diagnostic testing needed not appropriate for outpatient work up and Inpatient level of care appropriate due to severity  of illness   Dispo: The patient is from: Home               Anticipated d/c is to: CIR              Patient currently is not medically stable to d/c.   Difficult to place patient No   Consultants Neurology CIR  Procedures  Echocardiogram EEG  Antibiotics   Anti-infectives (From admission, onward)   Start     Dose/Rate Route Frequency Ordered Stop   04/10/21 1115  cephALEXin (KEFLEX) capsule 500 mg        500 mg Oral Every 12 hours 04/10/21 1017 04/13/21 0959   04/08/21 1600  cefTRIAXone (ROCEPHIN) 1 g in sodium chloride 0.9 % 100 mL IVPB  Status:  Discontinued        1 g 200 mL/hr over 30 Minutes Intravenous Every 24 hours 04/08/21 1528 04/10/21 1017      Subjective:   Howard White seen and examined today.  Patient feeling mildly better this morning.  Denies current pain, shortness of breath, chest pain, abdominal pain, dizziness or headache.   Objective:   Vitals:   04/11/21 1956 04/12/21 0005 04/12/21 0412 04/12/21 0803  BP: 110/69 108/77 106/71 114/72  Pulse: 79 65 82 89  Resp: 15 18 18 18   Temp: 98.1 F (36.7 C) 97.6 F (36.4 C) 98.1 F (36.7 C) 98.2 F (36.8 C)  TempSrc: Oral Oral Oral Oral  SpO2: 92% 94% 90% 94%  Weight:      Height:        Intake/Output Summary (Last 24 hours) at 04/12/2021 1135 Last data filed at 04/11/2021 1700 Gross per 24 hour  Intake 360 ml  Output --  Net 360 ml   Filed Weights   04/09/21 1800  Weight: 85.3 kg   Exam  General: Well developed, chronically ill-appearing, NAD  HEENT: NCAT,  mucous membranes moist.   Cardiovascular: S1 S2 auscultated, RRR, no murmur  Respiratory: Clear to auscultation bilaterally  Abdomen: Soft, nontender, nondistended, + bowel sounds  Extremities: warm dry without cyanosis clubbing or edema  Neuro: AAOx3, LUE spastic, LLE strength 4/5.  RU E and R LE strength 5/5.  Psych: appropriate mood and affect, pleasant  Data Reviewed: I have personally reviewed following labs and imaging studies  CBC: Recent Labs  Lab 04/08/21 0934  04/09/21 0053 04/10/21 0939  WBC 6.6 8.6 6.8  NEUTROABS 3.9  --  4.4  HGB 15.4 15.3 15.9  HCT 45.6 44.1 46.6  MCV 94.0 91.3 91.7  PLT 279 264 789   Basic Metabolic Panel: Recent Labs  Lab 04/08/21 0934 04/09/21 0053 04/10/21 0939  NA 137 134* 138  K 4.1 3.8 3.9  CL 103 102 104  CO2 24 24 28   GLUCOSE 97 121* 101*  BUN 17 13 13   CREATININE 1.08 1.16 1.04  CALCIUM 9.3 9.1 9.4   GFR: Estimated Creatinine Clearance: 79 mL/min (by C-G formula based on SCr of 1.04 mg/dL). Liver Function Tests: Recent Labs  Lab 04/08/21 0934 04/09/21 0053  AST 22 23  ALT 23 21  ALKPHOS 70 70  BILITOT 1.0 0.9  PROT 5.9* 5.9*  ALBUMIN 3.9 3.7   No results for input(s): LIPASE, AMYLASE in the last 168 hours. No results for input(s): AMMONIA in the last 168 hours. Coagulation Profile: No results for input(s): INR, PROTIME in the last 168 hours. Cardiac Enzymes: No results for input(s): CKTOTAL, CKMB, CKMBINDEX, TROPONINI in the last 168 hours. BNP (  last 3 results) No results for input(s): PROBNP in the last 8760 hours. HbA1C: No results for input(s): HGBA1C in the last 72 hours. CBG: Recent Labs  Lab 04/08/21 0952  GLUCAP 96   Lipid Profile: No results for input(s): CHOL, HDL, LDLCALC, TRIG, CHOLHDL, LDLDIRECT in the last 72 hours. Thyroid Function Tests: No results for input(s): TSH, T4TOTAL, FREET4, T3FREE, THYROIDAB in the last 72 hours. Anemia Panel: No results for input(s): VITAMINB12, FOLATE, FERRITIN, TIBC, IRON, RETICCTPCT in the last 72 hours. Urine analysis:    Component Value Date/Time   COLORURINE YELLOW 04/08/2021 [place] 04/08/2021 0934   LABSPEC 1.018 04/08/2021 0934   PHURINE 6.0 04/08/2021 0934   GLUCOSEU NEGATIVE 04/08/2021 [place] 04/08/2021 0934   BILIRUBINUR NEGATIVE 04/08/2021 0934   KETONESUR NEGATIVE 04/08/2021 0934   PROTEINUR NEGATIVE 04/08/2021 0934   NITRITE POSITIVE (A) 04/08/2021 0934   LEUKOCYTESUR NEGATIVE  04/08/2021 0934   Sepsis Labs: @LABRCNTIP (procalcitonin:4,lacticidven:4)  ) Recent Results (from the past 240 hour(s))  Urine culture     Status: Abnormal   Collection Time: 04/08/21  1:28 PM   Specimen: Urine, Random  Result Value Ref Range Status   Specimen Description URINE, RANDOM  Final   Special Requests   Final    NONE Performed at [facility], [address] [place], [place] [address]    Culture >=100,000 COLONIES/mL STAPHYLOCOCCUS HAEMOLYTICUS (A)  Final   Report Status 04/10/2021 FINAL  Final   Organism ID, Bacteria STAPHYLOCOCCUS HAEMOLYTICUS (A)  Final      Susceptibility   Staphylococcus haemolyticus - MIC*    CIPROFLOXACIN <=0.5 SENSITIVE Sensitive     GENTAMICIN <=0.5 SENSITIVE Sensitive     NITROFURANTOIN <=16 SENSITIVE Sensitive     OXACILLIN <=0.25 SENSITIVE Sensitive     TETRACYCLINE <=1 SENSITIVE Sensitive     VANCOMYCIN 1 SENSITIVE Sensitive     TRIMETH/SULFA <=10 SENSITIVE Sensitive     CLINDAMYCIN <=0.25 SENSITIVE Sensitive     RIFAMPIN <=0.5 SENSITIVE Sensitive     Inducible Clindamycin NEGATIVE Sensitive     * >=100,000 COLONIES/mL STAPHYLOCOCCUS HAEMOLYTICUS  Resp Panel by RT-PCR (Flu A&B, Covid) Nasopharyngeal Swab     Status: None   Collection Time: 04/08/21  1:51 PM   Specimen: Nasopharyngeal Swab; Nasopharyngeal(NP) swabs in vial transport medium  Result Value Ref Range Status   SARS Coronavirus 2 by RT PCR NEGATIVE NEGATIVE Final    Comment: (NOTE) SARS-CoV-2 target nucleic acids are NOT DETECTED.  The SARS-CoV-2 RNA is generally detectable in upper respiratory specimens during the acute phase of infection. The lowest concentration of SARS-CoV-2 viral copies this assay can detect is 138 copies/mL. A negative result does not preclude SARS-Cov-2 infection and should not be used as the sole basis for treatment or other patient management decisions. A negative result may occur with  improper specimen collection/handling, submission of  specimen other than nasopharyngeal swab, presence of viral mutation(s) within the areas targeted by this assay, and inadequate number of viral copies(<138 copies/mL). A negative result must be combined with clinical observations, patient history, and epidemiological information. The expected result is Negative.  Fact Sheet for Patients:  EntrepreneurPulse.com.au  Fact Sheet for Healthcare Providers:  IncredibleEmployment.be  This test is no t yet approved or cleared by the [place] FDA and  has been authorized for detection and/or diagnosis of SARS-CoV-2 by FDA under an Emergency Use Authorization (EUA). This EUA will remain  in effect (meaning this test can  be used) for the duration of the COVID-19 declaration under Section 564(b)(1) of the Act, 21 U.S.C.section 360bbb-3(b)(1), unless the authorization is terminated  or revoked sooner.       Influenza A by PCR NEGATIVE NEGATIVE Final   Influenza B by PCR NEGATIVE NEGATIVE Final    Comment: (NOTE) The Xpert Xpress SARS-CoV-2/FLU/RSV plus assay is intended as an aid in the diagnosis of influenza from Nasopharyngeal swab specimens and should not be used as a sole basis for treatment. Nasal washings and aspirates are unacceptable for Xpert Xpress SARS-CoV-2/FLU/RSV testing.  Fact Sheet for Patients: EntrepreneurPulse.com.au  Fact Sheet for Healthcare Providers: IncredibleEmployment.be  This test is not yet approved or cleared by the [place] FDA and has been authorized for detection and/or diagnosis of SARS-CoV-2 by FDA under an Emergency Use Authorization (EUA). This EUA will remain in effect (meaning this test can be used) for the duration of the COVID-19 declaration under Section 564(b)(1) of the Act, 21 U.S.C. section 360bbb-3(b)(1), unless the authorization is terminated or revoked.  Performed at [facility], [place] [address]  [place], [place] [address]       Radiology Studies: CT HEAD WO CONTRAST  Result Date: 04/10/2021 CLINICAL DATA:  Stroke follow-up.  Possible worsening balance. EXAM: CT HEAD WITHOUT CONTRAST TECHNIQUE: Contiguous axial images were obtained from the base of the skull through the vertex without intravenous contrast. COMPARISON:  Apr 08, 2021. FINDINGS: Motion degraded exam.  Within this limitation: Brain: No substantial change in edema associated with the recent right corona radiata/basal ganglia infarct seen on May 25th MRI. No evidence of interval acute large vascular territory infarct. No acute hemorrhage or progressive mass effect. Similar appearance of the left frontal CSF density resection cavity which bibasilar communicates with the left lateral ventricle, better characterized on recent MRI. No progressive ventriculomegaly. Similar patchy white matter hypodensities, which are nonspecific. Vascular: Calcific atherosclerosis without evidence of a hyperdense vessel. Skull: No acute fracture.  Prior left frontal craniotomy. Sinuses/Orbits: Visualized sinuses are largely clear. Small retention cyst in the right sphenoid sinus. Unremarkable orbits on limited assessment. Other: No mastoid effusions. IMPRESSION: 1. No substantial change in edema associated with the recent right corona radiata/basal ganglia infarct seen on May 25th MRI. No acute hemorrhage or progressive mass effect. 2. No evidence of interval/acute intracranial abnormality on this motion degraded exam. MRI could provide more sensitive evaluation for acute infarct if clinically indicated. Electronically Signed   By: Margaretha Sheffield MD   On: 04/10/2021 15:49   DG CHEST PORT 1 VIEW  Result Date: 04/10/2021 CLINICAL DATA:  60 year old with cough. EXAM: PORTABLE CHEST 1 VIEW COMPARISON:  11/04/2020 FINDINGS: Lungs are clear. Heart size is within normal limits and stable. Patient is mildly rotated towards the left. Bone structures are unremarkable.  Negative for a pneumothorax. IMPRESSION: No active disease. Electronically Signed   By: Markus Daft M.D.   On: 04/10/2021 14:26     Scheduled Meds: . aspirin EC  81 mg Oral Daily  . atorvastatin  40 mg Oral Daily  . baclofen  5 mg Oral TID  . calcium carbonate  1 tablet Oral Daily  . cephALEXin  500 mg Oral Q12H  . clopidogrel  75 mg Oral Daily  . docusate sodium  200 mg Oral BID  . enoxaparin (LOVENOX) injection  40 mg Subcutaneous Daily  . escitalopram  10 mg Oral Daily  . famotidine  20 mg Oral Daily  . hydrOXYzine  25 mg Oral TID  .  lamoTRIgine  225 mg Oral q morning  . lamoTRIgine  300 mg Oral QPM  . levothyroxine  112 mcg Oral QHS  . melatonin  6 mg Oral QHS  . multivitamin with minerals  1 tablet Oral Daily  . polyethylene glycol  17 g Oral Daily   Continuous Infusions:    LOS: 4 days   Time Spent in minutes   30 minutes  Lynsi Dooner D.O. on 04/12/2021 at 11:35 AM  Between 7am to 7pm - Please see pager noted on [email or web address]  After 7pm go to [email or web address]  And look for the night coverage person covering for me after hours  [facility] Office  [phone]

## 2021-04-13 NOTE — Progress Notes (Signed)
  SLP Cancellation Note  Patient Details Name: Howard White MRN: [id number] DOB: 1961-05-17   Cancelled treatment:       Reason Eval/Treat Not Completed: Patient's level of consciousness. Patient reportedly lethargic this PM due to needing Ativan. Will attempt to f/u next date.  Sonia Baller, MA, CCC-SLP Speech Therapy [facility] Acute Rehab

## 2021-04-13 NOTE — Progress Notes (Signed)
Inpatient Rehabilitation Admissions Coordinator  CIR bed is not available today. I met with patient with his Mom at bedside and they are aware. Patient states he is very "fidgety" today.   Danne Baxter, RN, MSN Rehab Admissions Coordinator [phone] 04/13/2021 11:20 AM

## 2021-04-13 NOTE — Progress Notes (Signed)
PT Cancellation Note  Patient Details Name: Howard White MRN: [id number] DOB: October 16, 1961   Cancelled Treatment:    Reason Eval/Treat Not Completed: Fatigue/lethargy limiting ability to participate Patient had recently received Ativan and is lethargic. Patient unable to stay awake during introduction of PT. PT will re-attempt as time allows.   Patrina Andreas A. Gilford Rile PT, DPT Acute Rehabilitation Services Pager [phone] Office [phone]    Linna Hoff 04/13/2021, 1:17 PM

## 2021-04-13 NOTE — Progress Notes (Signed)
PROGRESS NOTE    Howard White  ZOX:[id number] DOB: 02/24/1961 DOA: 04/08/2021 PCP: Seward Carol, MD   Brief Narrative:  HPI On 04/08/2021 by Dr. Imagene Sheller Howard White is a 60 y.o. male with medical history significant with history of astrocytoma with left frontal craniotomy resection 20 years ago, seizure disorder, hypothyroidism, history of CVA in November 2021, spastic bladder, ambulatory dysfunction.  The patient presented to the emergency department due to 2-3 falls while working with physical therapy today and bilateral lower extremity weakness.  Denies hitting his head.  Denies loss of consciousness. He has residual left-sided weakness of the upper and lower extremity since his previous stroke in November 2021. He was started on dual antiplatelet therapy with aspirin and Plavix for 3 weeks and then transitioned to aspirin alone.  He was also started on a high intensity statin therapy as well.  He was discharged to inpatient rehab therapy.  No slurred speech, no swallowing difficulties, no visual deficits, no cognitive deficits from his previous stroke.  Eating a regular diet.  Since previous stroke he was initially wheelchair-bound but with PT/OT has progressed to using a single pronged cane occasionally and able to get up out of the wheelchair to go to the bathroom or sit in a recliner at home.  Has been compliant with taking his medications.  His mother assists with his medications and puts them in a pill pack for him. The patient is asking if he can have something to drink because he is thirsty.  Has not had a formal follow evaluation or nursing swallow evaluation yet.  Neurology midlevel provider at bedside during my exam as well.  In the emergency department Shows relatively normal labs.  T head without contrast showed no acute findings.  MRI was positive for vessel infarct involving the right corona radiata and adjacent basal ganglia.  tPA was not given in the emergency department.  NIH  stroke scale upon my evaluation 2-3.  Interim history Patient admitted with CVA. Pending CIR admission- hopeful for 5/31. Assessment & Plan   Acute CVA -Patient presented with lower extremity weakness -CT head showed no acute finding -MRI brain showed acute small vessel infarct involving the right corona radiata and adjacent basal ganglia -Of note, patient did have CVA in November 2021 and has had residual left-sided weakness.  He was placed on DAPT at that time followed by aspirin alone. -Echocardiogram shows an EF of 60 to 81%, grade 1 diastolic dysfunction.  No regional wall motion abnormalities. -Carotid Doppler unremarkable for extracranial stenosis. -Transcranial Doppler shows no significant stenosis. -LDL 43, hemoglobin A1c 5.3 -Neurology consulted and appreciated- EEG ordered.  Recommended aspirin and Plavix for 3 weeks then aspirin alone -Currently on aspirin, Plavix, statin -PT OT recommending inpatient rehab- pending bed availability- hoping for 5/31  Possible urinary tract infection -UA showed positive nitrites, few bacteria, 0-5 WBCs -Urine culture >100k Staphylococcus haemolyticus (pansensitive) -was placed on ceftriaxone- transitioned to keflex through 5/30 (last day of antibiotics)  Hypothyroidism -Continue Synthroid  Seizure disorder -Stable, continue Lamictal -EEG showed evidence of epileptogenicity and cortical dysfunction left frontocentral region consistent with underlying craniotomy.  No seizures were seen throughout recording  Depression/anxiety -Continue Lexapro- dose may need to be titrated upward, however would allow for a few weeks before making dose adjusment -Continue scheduled atarax and ativan PRN for anxiety   History of astrocytoma -history of resection  DVT Prophylaxis  lovenox  Code Status: Full  Family Communication: none at bedside. Mother via phone  Disposition Plan:  Status is: Inpatient  Remains inpatient appropriate  because:Ongoing diagnostic testing needed not appropriate for outpatient work up and Inpatient level of care appropriate due to severity of illness   Dispo: The patient is from: Home              Anticipated d/c is to: CIR              Patient currently is not medically stable to d/c.   Difficult to place patient No   Consultants Neurology CIR  Procedures  Echocardiogram EEG  Antibiotics   Anti-infectives (From admission, onward)   Start     Dose/Rate Route Frequency Ordered Stop   04/10/21 1115  cephALEXin (KEFLEX) capsule 500 mg        500 mg Oral Every 12 hours 04/10/21 1017 04/12/21 2146   04/08/21 1600  cefTRIAXone (ROCEPHIN) 1 g in sodium chloride 0.9 % 100 mL IVPB  Status:  Discontinued        1 g 200 mL/hr over 30 Minutes Intravenous Every 24 hours 04/08/21 1528 04/10/21 1017      Subjective:   [name] seen and examined today.  Patient would like to go to rehab soon.  Denies chest pain or shortness of breath, abdominal pain, nausea or vomiting, dizziness or headache.   Objective:   Vitals:   04/12/21 1950 04/13/21 0036 04/13/21 0358 04/13/21 0722  BP: 118/72 117/80 122/76 118/75  Pulse: 84 63 69 70  Resp: 15 19 17 16   Temp: 98 F (36.7 C) 97.8 F (36.6 C) 97.7 F (36.5 C) 97.8 F (36.6 C)  TempSrc: Oral Oral Oral Oral  SpO2: 92% 96% 94% 94%  Weight:      Height:        Intake/Output Summary (Last 24 hours) at 04/13/2021 0933 Last data filed at 04/12/2021 1700 Gross per 24 hour  Intake 560 ml  Output --  Net 560 ml   Filed Weights   04/09/21 1800  Weight: 85.3 kg   Exam  General: Well developed, well nourished, NAD, appears stated age  HEENT: NCAT, mucous membranes moist.   Cardiovascular: S1 S2 auscultated, RRR, no murmur  Respiratory: Clear to auscultation bilaterally  Abdomen: Soft, nontender, nondistended, + bowel sounds  Extremities: warm dry without cyanosis clubbing or edema  Neuro: AAOx3, LUE spastic, LLE 4/5 strength.  RUE  and right LE strength 5 out of 5  Psych: appropriate mood and affect   Data Reviewed: I have personally reviewed following labs and imaging studies  CBC: Recent Labs  Lab 04/08/21 0934 04/09/21 0053 04/10/21 0939  WBC 6.6 8.6 6.8  NEUTROABS 3.9  --  4.4  HGB 15.4 15.3 15.9  HCT 45.6 44.1 46.6  MCV 94.0 91.3 91.7  PLT 279 264 741   Basic Metabolic Panel: Recent Labs  Lab 04/08/21 0934 04/09/21 0053 04/10/21 0939  NA 137 134* 138  K 4.1 3.8 3.9  CL 103 102 104  CO2 24 24 28   GLUCOSE 97 121* 101*  BUN 17 13 13   CREATININE 1.08 1.16 1.04  CALCIUM 9.3 9.1 9.4   GFR: Estimated Creatinine Clearance: 79 mL/min (by C-G formula based on SCr of 1.04 mg/dL). Liver Function Tests: Recent Labs  Lab 04/08/21 0934 04/09/21 0053  AST 22 23  ALT 23 21  ALKPHOS 70 70  BILITOT 1.0 0.9  PROT 5.9* 5.9*  ALBUMIN 3.9 3.7   No results for input(s): LIPASE, AMYLASE in the last 168 hours. No results  for input(s): AMMONIA in the last 168 hours. Coagulation Profile: No results for input(s): INR, PROTIME in the last 168 hours. Cardiac Enzymes: No results for input(s): CKTOTAL, CKMB, CKMBINDEX, TROPONINI in the last 168 hours. BNP (last 3 results) No results for input(s): PROBNP in the last 8760 hours. HbA1C: No results for input(s): HGBA1C in the last 72 hours. CBG: Recent Labs  Lab 04/08/21 0952  GLUCAP 96   Lipid Profile: No results for input(s): CHOL, HDL, LDLCALC, TRIG, CHOLHDL, LDLDIRECT in the last 72 hours. Thyroid Function Tests: No results for input(s): TSH, T4TOTAL, FREET4, T3FREE, THYROIDAB in the last 72 hours. Anemia Panel: No results for input(s): VITAMINB12, FOLATE, FERRITIN, TIBC, IRON, RETICCTPCT in the last 72 hours. Urine analysis:    Component Value Date/Time   COLORURINE YELLOW 04/08/2021 [place] 04/08/2021 0934   LABSPEC 1.018 04/08/2021 0934   PHURINE 6.0 04/08/2021 0934   GLUCOSEU NEGATIVE 04/08/2021 0934   HGBUR NEGATIVE  04/08/2021 0934   BILIRUBINUR NEGATIVE 04/08/2021 0934   KETONESUR NEGATIVE 04/08/2021 0934   PROTEINUR NEGATIVE 04/08/2021 0934   NITRITE POSITIVE (A) 04/08/2021 0934   LEUKOCYTESUR NEGATIVE 04/08/2021 0934   Sepsis Labs: @LABRCNTIP (procalcitonin:4,lacticidven:4)  ) Recent Results (from the past 240 hour(s))  Urine culture     Status: Abnormal   Collection Time: 04/08/21  1:28 PM   Specimen: Urine, Random  Result Value Ref Range Status   Specimen Description URINE, RANDOM  Final   Special Requests   Final    NONE Performed at [facility], [address] [place], [place] [address]    Culture >=100,000 COLONIES/mL STAPHYLOCOCCUS HAEMOLYTICUS (A)  Final   Report Status 04/10/2021 FINAL  Final   Organism ID, Bacteria STAPHYLOCOCCUS HAEMOLYTICUS (A)  Final      Susceptibility   Staphylococcus haemolyticus - MIC*    CIPROFLOXACIN <=0.5 SENSITIVE Sensitive     GENTAMICIN <=0.5 SENSITIVE Sensitive     NITROFURANTOIN <=16 SENSITIVE Sensitive     OXACILLIN <=0.25 SENSITIVE Sensitive     TETRACYCLINE <=1 SENSITIVE Sensitive     VANCOMYCIN 1 SENSITIVE Sensitive     TRIMETH/SULFA <=10 SENSITIVE Sensitive     CLINDAMYCIN <=0.25 SENSITIVE Sensitive     RIFAMPIN <=0.5 SENSITIVE Sensitive     Inducible Clindamycin NEGATIVE Sensitive     * >=100,000 COLONIES/mL STAPHYLOCOCCUS HAEMOLYTICUS  Resp Panel by RT-PCR (Flu A&B, Covid) Nasopharyngeal Swab     Status: None   Collection Time: 04/08/21  1:51 PM   Specimen: Nasopharyngeal Swab; Nasopharyngeal(NP) swabs in vial transport medium  Result Value Ref Range Status   SARS Coronavirus 2 by RT PCR NEGATIVE NEGATIVE Final    Comment: (NOTE) SARS-CoV-2 target nucleic acids are NOT DETECTED.  The SARS-CoV-2 RNA is generally detectable in upper respiratory specimens during the acute phase of infection. The lowest concentration of SARS-CoV-2 viral copies this assay can detect is 138 copies/mL. A negative result does not preclude  SARS-Cov-2 infection and should not be used as the sole basis for treatment or other patient management decisions. A negative result may occur with  improper specimen collection/handling, submission of specimen other than nasopharyngeal swab, presence of viral mutation(s) within the areas targeted by this assay, and inadequate number of viral copies(<138 copies/mL). A negative result must be combined with clinical observations, patient history, and epidemiological information. The expected result is Negative.  Fact Sheet for Patients:  EntrepreneurPulse.com.au  Fact Sheet for Healthcare Providers:  IncredibleEmployment.be  This test is no t yet approved  or cleared by the [place] and  has been authorized for detection and/or diagnosis of SARS-CoV-2 by FDA under an Emergency Use Authorization (EUA). This EUA will remain  in effect (meaning this test can be used) for the duration of the COVID-19 declaration under Section 564(b)(1) of the Act, 21 U.S.C.section 360bbb-3(b)(1), unless the authorization is terminated  or revoked sooner.       Influenza A by PCR NEGATIVE NEGATIVE Final   Influenza B by PCR NEGATIVE NEGATIVE Final    Comment: (NOTE) The Xpert Xpress SARS-CoV-2/FLU/RSV plus assay is intended as an aid in the diagnosis of influenza from Nasopharyngeal swab specimens and should not be used as a sole basis for treatment. Nasal washings and aspirates are unacceptable for Xpert Xpress SARS-CoV-2/FLU/RSV testing.  Fact Sheet for Patients: EntrepreneurPulse.com.au  Fact Sheet for Healthcare Providers: IncredibleEmployment.be  This test is not yet approved or cleared by the [place] FDA and has been authorized for detection and/or diagnosis of SARS-CoV-2 by FDA under an Emergency Use Authorization (EUA). This EUA will remain in effect (meaning this test can be used) for the duration of  the COVID-19 declaration under Section 564(b)(1) of the Act, 21 U.S.C. section 360bbb-3(b)(1), unless the authorization is terminated or revoked.  Performed at [facility], [place] [address], [place], [place] [address]       Radiology Studies: No results found.   Scheduled Meds: . aspirin EC  81 mg Oral Daily  . atorvastatin  40 mg Oral Daily  . baclofen  5 mg Oral TID  . calcium carbonate  1 tablet Oral Daily  . clopidogrel  75 mg Oral Daily  . docusate sodium  200 mg Oral BID  . enoxaparin (LOVENOX) injection  40 mg Subcutaneous Daily  . escitalopram  10 mg Oral Daily  . famotidine  20 mg Oral Daily  . hydrOXYzine  25 mg Oral TID  . lamoTRIgine  225 mg Oral q morning  . lamoTRIgine  300 mg Oral QPM  . levothyroxine  112 mcg Oral QHS  . melatonin  6 mg Oral QHS  . multivitamin with minerals  1 tablet Oral Daily  . polyethylene glycol  17 g Oral Daily   Continuous Infusions:    LOS: 5 days   Time Spent in minutes   30 minutes  Oluwanifemi Petitti D.O. on 04/13/2021 at 9:33 AM  Between 7am to 7pm - Please see pager noted on [email or web address]  After 7pm go to [email or web address]  And look for the night coverage person covering for me after hours  [facility] Office  [phone]

## 2021-04-14 ENCOUNTER — Inpatient Hospital Stay (HOSPITAL_COMMUNITY)
Admission: RE | Admit: 2021-04-14 | Discharge: 2021-05-09 | DRG: 057 | Disposition: A | Discharge status: Home Health | Payer: PPO | Source: Intra-hospital | Attending: Physical Medicine & Rehabilitation | Admitting: Physical Medicine & Rehabilitation

## 2021-04-14 ENCOUNTER — Encounter (HOSPITAL_COMMUNITY): Payer: Self-pay | Admitting: Physical Medicine & Rehabilitation

## 2021-04-14 ENCOUNTER — Ambulatory Visit: Payer: PPO | Admitting: Physical Therapy

## 2021-04-14 ENCOUNTER — Other Ambulatory Visit: Payer: Self-pay

## 2021-04-14 ENCOUNTER — Ambulatory Visit: Payer: PPO | Admitting: Occupational Therapy

## 2021-04-14 DIAGNOSIS — Z7982 Long term (current) use of aspirin: Secondary | ICD-10-CM

## 2021-04-14 DIAGNOSIS — I6381 Other cerebral infarction due to occlusion or stenosis of small artery: Secondary | ICD-10-CM | POA: Diagnosis not present

## 2021-04-14 DIAGNOSIS — E739 Lactose intolerance, unspecified: Secondary | ICD-10-CM | POA: Diagnosis not present

## 2021-04-14 DIAGNOSIS — G9340 Encephalopathy, unspecified: Secondary | ICD-10-CM | POA: Diagnosis not present

## 2021-04-14 DIAGNOSIS — R410 Disorientation, unspecified: Secondary | ICD-10-CM | POA: Diagnosis not present

## 2021-04-14 DIAGNOSIS — Z7401 Bed confinement status: Secondary | ICD-10-CM | POA: Diagnosis not present

## 2021-04-14 DIAGNOSIS — F419 Anxiety disorder, unspecified: Secondary | ICD-10-CM | POA: Diagnosis not present

## 2021-04-14 DIAGNOSIS — I69354 Hemiplegia and hemiparesis following cerebral infarction affecting left non-dominant side: Secondary | ICD-10-CM | POA: Diagnosis not present

## 2021-04-14 DIAGNOSIS — N319 Neuromuscular dysfunction of bladder, unspecified: Secondary | ICD-10-CM | POA: Diagnosis present

## 2021-04-14 DIAGNOSIS — G4700 Insomnia, unspecified: Secondary | ICD-10-CM | POA: Diagnosis not present

## 2021-04-14 DIAGNOSIS — G8114 Spastic hemiplegia affecting left nondominant side: Secondary | ICD-10-CM | POA: Diagnosis not present

## 2021-04-14 DIAGNOSIS — G8190 Hemiplegia, unspecified affecting unspecified side: Secondary | ICD-10-CM | POA: Diagnosis not present

## 2021-04-14 DIAGNOSIS — I6930 Unspecified sequelae of cerebral infarction: Secondary | ICD-10-CM | POA: Diagnosis not present

## 2021-04-14 DIAGNOSIS — G40909 Epilepsy, unspecified, not intractable, without status epilepticus: Secondary | ICD-10-CM | POA: Diagnosis present

## 2021-04-14 DIAGNOSIS — I69391 Dysphagia following cerebral infarction: Secondary | ICD-10-CM | POA: Diagnosis not present

## 2021-04-14 DIAGNOSIS — Z808 Family history of malignant neoplasm of other organs or systems: Secondary | ICD-10-CM

## 2021-04-14 DIAGNOSIS — R4701 Aphasia: Secondary | ICD-10-CM | POA: Diagnosis not present

## 2021-04-14 DIAGNOSIS — Z85841 Personal history of malignant neoplasm of brain: Secondary | ICD-10-CM | POA: Diagnosis not present

## 2021-04-14 DIAGNOSIS — D72829 Elevated white blood cell count, unspecified: Secondary | ICD-10-CM | POA: Diagnosis not present

## 2021-04-14 DIAGNOSIS — Z79890 Hormone replacement therapy (postmenopausal): Secondary | ICD-10-CM

## 2021-04-14 DIAGNOSIS — F32A Depression, unspecified: Secondary | ICD-10-CM | POA: Diagnosis not present

## 2021-04-14 DIAGNOSIS — R32 Unspecified urinary incontinence: Secondary | ICD-10-CM | POA: Diagnosis present

## 2021-04-14 DIAGNOSIS — R7989 Other specified abnormal findings of blood chemistry: Secondary | ICD-10-CM | POA: Diagnosis not present

## 2021-04-14 DIAGNOSIS — G479 Sleep disorder, unspecified: Secondary | ICD-10-CM | POA: Diagnosis present

## 2021-04-14 DIAGNOSIS — R296 Repeated falls: Secondary | ICD-10-CM | POA: Diagnosis present

## 2021-04-14 DIAGNOSIS — R400 Somnolence: Secondary | ICD-10-CM | POA: Diagnosis present

## 2021-04-14 DIAGNOSIS — R918 Other nonspecific abnormal finding of lung field: Secondary | ICD-10-CM | POA: Diagnosis not present

## 2021-04-14 DIAGNOSIS — Z79899 Other long term (current) drug therapy: Secondary | ICD-10-CM | POA: Diagnosis not present

## 2021-04-14 DIAGNOSIS — N179 Acute kidney failure, unspecified: Secondary | ICD-10-CM | POA: Diagnosis present

## 2021-04-14 DIAGNOSIS — T17928A Food in respiratory tract, part unspecified causing other injury, initial encounter: Secondary | ICD-10-CM

## 2021-04-14 DIAGNOSIS — R451 Restlessness and agitation: Secondary | ICD-10-CM | POA: Diagnosis not present

## 2021-04-14 DIAGNOSIS — Z993 Dependence on wheelchair: Secondary | ICD-10-CM | POA: Diagnosis not present

## 2021-04-14 DIAGNOSIS — R1310 Dysphagia, unspecified: Secondary | ICD-10-CM | POA: Diagnosis present

## 2021-04-14 DIAGNOSIS — Z823 Family history of stroke: Secondary | ICD-10-CM | POA: Diagnosis not present

## 2021-04-14 DIAGNOSIS — I469 Cardiac arrest, cause unspecified: Secondary | ICD-10-CM | POA: Diagnosis not present

## 2021-04-14 DIAGNOSIS — E039 Hypothyroidism, unspecified: Secondary | ICD-10-CM | POA: Diagnosis not present

## 2021-04-14 DIAGNOSIS — R1312 Dysphagia, oropharyngeal phase: Secondary | ICD-10-CM | POA: Diagnosis not present

## 2021-04-14 DIAGNOSIS — J9811 Atelectasis: Secondary | ICD-10-CM | POA: Diagnosis not present

## 2021-04-14 DIAGNOSIS — R52 Pain, unspecified: Secondary | ICD-10-CM | POA: Diagnosis not present

## 2021-04-14 DIAGNOSIS — R159 Full incontinence of feces: Secondary | ICD-10-CM | POA: Diagnosis not present

## 2021-04-14 DIAGNOSIS — R4182 Altered mental status, unspecified: Secondary | ICD-10-CM | POA: Diagnosis not present

## 2021-04-14 DIAGNOSIS — G8110 Spastic hemiplegia affecting unspecified side: Secondary | ICD-10-CM | POA: Diagnosis present

## 2021-04-14 DIAGNOSIS — I639 Cerebral infarction, unspecified: Secondary | ICD-10-CM | POA: Diagnosis not present

## 2021-04-14 MED ORDER — LEVOTHYROXINE SODIUM 112 MCG PO TABS
112.0000 ug | ORAL_TABLET | Freq: Every day | ORAL | Status: DC
  Administered 2021-04-14 – 2021-05-08 (×24): 112 ug via ORAL
  Filled 2021-04-14 (×26): qty 1

## 2021-04-14 MED ORDER — LORAZEPAM 0.5 MG PO TABS: 0.5000 mg | ORAL_TABLET | Freq: Four times a day (QID) | ORAL | 0 refills | Status: AC | PRN

## 2021-04-14 MED ORDER — KETOTIFEN FUMARATE 0.025 % OP SOLN
1.0000 [drp] | Freq: Two times a day (BID) | OPHTHALMIC | Status: DC | PRN
  Filled 2021-04-14: qty 5

## 2021-04-14 MED ORDER — CLOPIDOGREL BISULFATE 75 MG PO TABS
75.0000 mg | ORAL_TABLET | Freq: Every day | ORAL | Status: AC
  Administered 2021-04-15 – 2021-04-28 (×14): 75 mg via ORAL
  Filled 2021-04-14 (×14): qty 1

## 2021-04-14 MED ORDER — CAMPHOR-MENTHOL 0.5-0.5 % EX LOTN
TOPICAL_LOTION | CUTANEOUS | Status: DC | PRN
  Filled 2021-04-14: qty 222

## 2021-04-14 MED ORDER — ATORVASTATIN CALCIUM 40 MG PO TABS
40.0000 mg | ORAL_TABLET | Freq: Every day | ORAL | Status: DC
  Administered 2021-04-15 – 2021-04-17 (×3): 40 mg via ORAL
  Filled 2021-04-14 (×3): qty 1

## 2021-04-14 MED ORDER — ESCITALOPRAM OXALATE 10 MG PO TABS
10.0000 mg | ORAL_TABLET | Freq: Every day | ORAL | Status: DC
  Administered 2021-04-15 – 2021-04-18 (×4): 10 mg via ORAL
  Filled 2021-04-14 (×4): qty 1

## 2021-04-14 MED ORDER — LAMOTRIGINE 150 MG PO TABS
300.0000 mg | ORAL_TABLET | Freq: Every evening | ORAL | Status: DC
  Administered 2021-04-14 – 2021-05-08 (×25): 300 mg via ORAL
  Filled 2021-04-14 (×27): qty 2

## 2021-04-14 MED ORDER — PROCHLORPERAZINE 25 MG RE SUPP: 12.5000 mg | Freq: Four times a day (QID) | RECTAL | Status: DC | PRN

## 2021-04-14 MED ORDER — CALCIUM CARBONATE ANTACID 500 MG PO CHEW
1.0000 | CHEWABLE_TABLET | Freq: Every day | ORAL | Status: DC
  Administered 2021-04-15 – 2021-05-09 (×25): 200 mg via ORAL
  Filled 2021-04-14 (×25): qty 1

## 2021-04-14 MED ORDER — PROCHLORPERAZINE MALEATE 5 MG PO TABS: 5.0000 mg | ORAL_TABLET | Freq: Four times a day (QID) | ORAL | Status: DC | PRN

## 2021-04-14 MED ORDER — HYDROXYZINE HCL 25 MG PO TABS: 25.0000 mg | ORAL_TABLET | Freq: Three times a day (TID) | ORAL | Status: DC

## 2021-04-14 MED ORDER — PROCHLORPERAZINE EDISYLATE 10 MG/2ML IJ SOLN: 5.0000 mg | Freq: Four times a day (QID) | INTRAMUSCULAR | Status: DC | PRN

## 2021-04-14 MED ORDER — ADULT MULTIVITAMIN W/MINERALS CH
1.0000 | ORAL_TABLET | Freq: Every day | ORAL | Status: DC
  Administered 2021-04-15 – 2021-05-09 (×25): 1 via ORAL
  Filled 2021-04-14 (×25): qty 1

## 2021-04-14 MED ORDER — POLYETHYLENE GLYCOL 3350 17 G PO PACK: 17.0000 g | PACK | Freq: Every day | ORAL | Status: DC | PRN

## 2021-04-14 MED ORDER — DIPHENHYDRAMINE HCL 12.5 MG/5ML PO ELIX
12.5000 mg | ORAL_SOLUTION | Freq: Four times a day (QID) | ORAL | Status: DC | PRN
  Administered 2021-04-14 – 2021-04-16 (×3): 25 mg via ORAL
  Filled 2021-04-14 (×4): qty 10

## 2021-04-14 MED ORDER — FLEET ENEMA 7-19 GM/118ML RE ENEM
1.0000 | ENEMA | Freq: Once | RECTAL | Status: DC | PRN
Start: 2021-04-14 — End: 2021-05-09

## 2021-04-14 MED ORDER — ACETAMINOPHEN 325 MG PO TABS
325.0000 mg | ORAL_TABLET | ORAL | Status: DC | PRN
  Administered 2021-04-16 – 2021-05-08 (×9): 650 mg via ORAL
  Filled 2021-04-14 (×10): qty 2

## 2021-04-14 MED ORDER — CLOPIDOGREL BISULFATE 75 MG PO TABS: 75.0000 mg | ORAL_TABLET | Freq: Every day | ORAL | Status: DC

## 2021-04-14 MED ORDER — TRAZODONE HCL 50 MG PO TABS
25.0000 mg | ORAL_TABLET | Freq: Every evening | ORAL | Status: DC | PRN
  Administered 2021-04-15: 50 mg via ORAL
  Filled 2021-04-14: qty 1

## 2021-04-14 MED ORDER — BACLOFEN 5 MG HALF TABLET
5.0000 mg | ORAL_TABLET | Freq: Three times a day (TID) | ORAL | Status: DC
  Administered 2021-04-14 – 2021-04-15 (×4): 5 mg via ORAL
  Filled 2021-04-14 (×5): qty 1

## 2021-04-14 MED ORDER — ALUM & MAG HYDROXIDE-SIMETH 200-200-20 MG/5ML PO SUSP: 30.0000 mL | ORAL | Status: DC | PRN

## 2021-04-14 MED ORDER — HYDROXYZINE HCL 25 MG PO TABS
25.0000 mg | ORAL_TABLET | Freq: Three times a day (TID) | ORAL | Status: DC
  Administered 2021-04-14 – 2021-04-22 (×22): 25 mg via ORAL
  Filled 2021-04-14 (×22): qty 1

## 2021-04-14 MED ORDER — ENOXAPARIN SODIUM 40 MG/0.4ML IJ SOSY
40.0000 mg | PREFILLED_SYRINGE | Freq: Every day | INTRAMUSCULAR | Status: DC
  Administered 2021-04-15 – 2021-05-09 (×25): 40 mg via SUBCUTANEOUS
  Filled 2021-04-14 (×28): qty 0.4

## 2021-04-14 MED ORDER — LAMOTRIGINE 25 MG PO TABS
225.0000 mg | ORAL_TABLET | Freq: Every day | ORAL | Status: DC
  Administered 2021-04-15 – 2021-05-09 (×25): 225 mg via ORAL
  Filled 2021-04-14 (×25): qty 1

## 2021-04-14 MED ORDER — POLYETHYLENE GLYCOL 3350 17 G PO PACK
17.0000 g | PACK | Freq: Every day | ORAL | Status: DC
  Administered 2021-04-15 – 2021-05-09 (×22): 17 g via ORAL
  Filled 2021-04-14 (×23): qty 1

## 2021-04-14 MED ORDER — MELATONIN 3 MG PO TABS
6.0000 mg | ORAL_TABLET | Freq: Every day | ORAL | Status: DC
  Administered 2021-04-14 – 2021-05-08 (×24): 6 mg via ORAL
  Filled 2021-04-14 (×25): qty 2

## 2021-04-14 MED ORDER — GUAIFENESIN-DM 100-10 MG/5ML PO SYRP: 5.0000 mL | ORAL_SOLUTION | Freq: Four times a day (QID) | ORAL | Status: DC | PRN

## 2021-04-14 MED ORDER — ASPIRIN EC 81 MG PO TBEC
81.0000 mg | DELAYED_RELEASE_TABLET | Freq: Every day | ORAL | Status: DC
  Administered 2021-04-15 – 2021-05-09 (×25): 81 mg via ORAL
  Filled 2021-04-14 (×25): qty 1

## 2021-04-14 MED ORDER — LORAZEPAM 1 MG PO TABS
0.5000 mg | ORAL_TABLET | Freq: Four times a day (QID) | ORAL | Status: DC | PRN
  Administered 2021-04-14 – 2021-04-16 (×4): 0.5 mg via ORAL
  Filled 2021-04-14 (×4): qty 1

## 2021-04-14 MED ORDER — FAMOTIDINE 20 MG PO TABS
20.0000 mg | ORAL_TABLET | Freq: Every day | ORAL | Status: DC
  Administered 2021-04-15 – 2021-05-09 (×25): 20 mg via ORAL
  Filled 2021-04-14 (×25): qty 1

## 2021-04-14 MED ORDER — BACLOFEN 5 MG HALF TABLET
5.0000 mg | ORAL_TABLET | Freq: Two times a day (BID) | ORAL | Status: DC | PRN
  Administered 2021-04-14 – 2021-05-03 (×4): 5 mg via ORAL
  Filled 2021-04-14 (×5): qty 1

## 2021-04-14 MED ORDER — BISACODYL 10 MG RE SUPP
10.0000 mg | Freq: Every day | RECTAL | Status: DC | PRN
  Administered 2021-05-01: 10 mg via RECTAL
  Filled 2021-04-14: qty 1

## 2021-04-14 MED ORDER — DOCUSATE SODIUM 100 MG PO CAPS
200.0000 mg | ORAL_CAPSULE | Freq: Two times a day (BID) | ORAL | Status: DC
  Administered 2021-04-14 – 2021-04-28 (×22): 200 mg via ORAL
  Filled 2021-04-14 (×27): qty 2

## 2021-04-14 NOTE — Progress Notes (Signed)
Patient ID: Howard White, male   DOB: 11/22/1960, 60 y.o.   MRN: [id number] Met with the patient to re-introduce self and the role of the nurse CM. Patient reports he does not remember staff from the last time here. Patient is responsive but slow with flat affect. Given memory/cognitive issues, will need to follow up with mother to identify and review educational needs. Continue to follow along to discharge to review educational needs and collaborate with the SW to facilitate preparation for discharge. Margarito Liner

## 2021-04-14 NOTE — Discharge Summary (Signed)
Physician Discharge Summary  Howard White BPZ:[id number] DOB: July 21, 1961 DOA: 04/08/2021  PCP: Seward Carol, MD  Admit date: 04/08/2021 Discharge date: 04/14/2021  Time spent: 45 minutes  Recommendations for Outpatient Follow-up:  Patient will be discharged to [facility].  Patient will need to follow up with primary care provider within one week of discharge.  He will need to follow up with neurology when discharged from rehab. Patient should continue medications as prescribed.  Patient should follow a regular diet.   Discharge Diagnoses:  Acute CVA Possible urinary tract infection Hypothyroidism Seizure disorder Depression/anxiety History of astrocytoma  Discharge Condition: stable  Diet recommendation: regular  Filed Weights   04/09/21 1800  Weight: 85.3 kg    History of present illness:  On 04/08/2021 by Dr. York Pellant Newsomeis a 59 y.o.malewith medical history significantwith history of astrocytoma with left frontal craniotomy resection 20 years ago, seizure disorder, hypothyroidism,history of CVA in November 2021, spastic bladder, ambulatory dysfunction. The patient presented to the emergency department due to 2-3 falls while working with physical therapy todayand bilateral lower extremity weakness. Denies hitting his head. Denies loss of consciousness. He has residual left-sided weakness of the upper and lower extremity since his previous stroke in November 2021.He was started on dual antiplatelet therapy with aspirin and Plavix for 3 weeks and then transitionedto aspirin alone. He was also started on a high intensity statin therapy as well. He was discharged to inpatient rehab therapy. No slurred speech, noswallowing difficulties, no visual deficits, no cognitive deficits from his previous stroke.Eating a regular diet. Since previous stroke he was initially wheelchair-bound but withPT/OT has progressed to using a single prongedcane  occasionally and able to get up out of the wheelchair to go to the bathroom or sit in a recliner at home. Has been compliant with taking his medications. His mother assists with his medications and puts them in a pill pack for him. The patient is asking if he can have something to drink because he is thirsty. Has not had a formal follow evaluation or nursing swallow evaluation yet. Neurology midlevel provider at bedside during my exam as well.In the emergency departmentShows relatively normal labs. T head without contrast showed no acute findings. MRI was positive for vessel infarct involving the right corona radiata and adjacent basal ganglia. tPA was not given in the emergency department. NIH stroke scale upon my evaluation 2-3.  Hospital Course:  Acute CVA -Patient presented with lower extremity weakness -CT head showed no acute finding -MRI brain showed acute small vessel infarct involving the right corona radiata and adjacent basal ganglia -Of note, patient did have CVA in November 2021 and has had residual left-sided weakness.  He was placed on DAPT at that time followed by aspirin alone. -Echocardiogram shows an EF of 60 to 24%, grade 1 diastolic dysfunction.  No regional wall motion abnormalities. -Carotid Doppler unremarkable for extracranial stenosis. -Transcranial Doppler shows no significant stenosis. -LDL 43, hemoglobin A1c 5.3 -Neurology consulted and appreciated- EEG ordered.  Recommended aspirin and Plavix for 3 weeks then aspirin alone -Currently on aspirin, Plavix, statin -PT OT recommending inpatient rehab- pending bed availability- hoping for 5/31  Possible urinary tract infection -UA showed positive nitrites, few bacteria, 0-5 WBCs -Urine culture >100k Staphylococcus haemolyticus (pansensitive) -was placed on ceftriaxone- completed keflex on 5/30   Hypothyroidism -Continue Synthroid  Seizure disorder -Stable, continue Lamictal -EEG showed evidence of  epileptogenicity and cortical dysfunction left frontocentral region consistent with underlying craniotomy.  No seizures were seen throughout  recording  Depression/anxiety -Continue Lexapro- dose may need to be titrated upward, however would allow for a few weeks before making dose adjusment -Continue scheduled atarax and ativan PRN for anxiety   History of astrocytoma -history of resection  Consultants Neurology CIR  Procedures  Echocardiogram EEG   Discharge Exam: Vitals:   04/14/21 0411 04/14/21 0715  BP: 136/79 119/75  Pulse: 98 98  Resp: 19 20  Temp: 97.6 F (36.4 C) 98.7 F (37.1 C)  SpO2: 94% 94%     General: Well developed, well nourished, NAD, appears stated age  HEENT: NCAT, mucous membranes moist.  Cardiovascular: S1 S2 auscultated, RRR, no murmur.  Respiratory: Clear to auscultation bilaterally with equal chest rise  Abdomen: Soft, nontender, nondistended, + bowel sounds  Extremities: warm dry without cyanosis clubbing or edema  Neuro: AAOx3, LUE spastic, LLE 4/5 strength, RUE and RLE strength 5/5  Psych: Appropriate mood and affect  Discharge Instructions Discharge Instructions    Ambulatory referral to Neurology   Complete by: As directed    An appointment is requested in approximately: 4-6weeks     Allergies as of 04/14/2021      Reactions   Lactose Intolerance (gi)       Medication List    TAKE these medications   acetaminophen 325 MG tablet Commonly known as: TYLENOL Take 2 tablets (650 mg total) by mouth every 6 (six) hours as needed for mild pain (or Fever >/= 101).   aspirin 81 MG EC tablet Take 1 tablet (81 mg total) by mouth daily. Swallow whole. What changed: when to take this   atorvastatin 40 MG tablet Commonly known as: LIPITOR Take 40 mg by mouth every evening.   Baclofen 5 MG Tabs Take 5-10 mg by mouth as directed. Take 1 tablet (5 mg) in the morning and Take 2 tablets (10 mg) at bedtime   calcium carbonate  500 MG chewable tablet Commonly known as: TUMS - dosed in mg elemental calcium Chew 1 tablet (200 mg of elemental calcium total) by mouth daily. What changed:   when to take this  reasons to take this   clopidogrel 75 MG tablet Commonly known as: PLAVIX Take 1 tablet (75 mg total) by mouth daily.   docusate sodium 100 MG capsule Commonly known as: COLACE Take 2 capsules (200 mg total) by mouth 2 (two) times daily. What changed:   when to take this  reasons to take this   escitalopram 10 MG tablet Commonly known as: LEXAPRO Take 1 tablet (10 mg total) by mouth daily.   famotidine 20 MG tablet Commonly known as: PEPCID Take 1 tablet (20 mg total) by mouth daily. What changed: Another medication with the same name was removed. Continue taking this medication, and follow the directions you see here.   fluticasone 50 MCG/ACT nasal spray Commonly known as: FLONASE Place 1 spray into both nostrils 2 (two) times daily as needed for allergies.   hydrOXYzine 25 MG tablet Commonly known as: ATARAX/VISTARIL Take 1 tablet (25 mg total) by mouth 3 (three) times daily.   ketotifen 0.025 % ophthalmic solution Commonly known as: ZADITOR Place 1 drop into both eyes 2 (two) times daily as needed (for itchy eyes).   lamoTRIgine 150 MG tablet Commonly known as: LAMICTAL TAKE 1 AND 1/2 TABS EVERY MORNING AND 2 TAB EVERY EVENING What changed:   how much to take  how to take this  when to take this  additional instructions   LORazepam 0.5 MG tablet  Commonly known as: ATIVAN Take 1 tablet (0.5 mg total) by mouth every 6 (six) hours as needed for anxiety.   melatonin 3 MG Tabs tablet Take 2 tablets (6 mg total) by mouth at bedtime. What changed:   when to take this  reasons to take this   multivitamin with minerals tablet Take 1 tablet by mouth daily.   polyethylene glycol 17 g packet Commonly known as: MIRALAX / GLYCOLAX Take 17 g by mouth daily. What changed:    when to take this  reasons to take this   Synthroid 112 MCG tablet Generic drug: levothyroxine Take 1 tablet (112 mcg total) by mouth at bedtime.      Allergies  Allergen Reactions  . Lactose Intolerance (Gi)     Follow-up Information    Polite, Jori Moll, MD. Schedule an appointment as soon as possible for a visit in 1 week(s).   Specialty: Internal Medicine Why: Hospital follow up Contact information: [address] [place] [address] [phone]        Garvin Fila, MD. Schedule an appointment as soon as possible for a visit in 5 week(s).   Specialties: Neurology, Radiology Why: Hospital follow up Contact information: [address] [place] [address] [phone]                The results of significant diagnostics from this hospitalization (including imaging, microbiology, ancillary and laboratory) are listed below for reference.    Significant Diagnostic Studies: CT HEAD WO CONTRAST  Result Date: 04/10/2021 CLINICAL DATA:  Stroke follow-up.  Possible worsening balance. EXAM: CT HEAD WITHOUT CONTRAST TECHNIQUE: Contiguous axial images were obtained from the base of the skull through the vertex without intravenous contrast. COMPARISON:  Apr 08, 2021. FINDINGS: Motion degraded exam.  Within this limitation: Brain: No substantial change in edema associated with the recent right corona radiata/basal ganglia infarct seen on May 25th MRI. No evidence of interval acute large vascular territory infarct. No acute hemorrhage or progressive mass effect. Similar appearance of the left frontal CSF density resection cavity which bibasilar communicates with the left lateral ventricle, better characterized on recent MRI. No progressive ventriculomegaly. Similar patchy white matter hypodensities, which are nonspecific. Vascular: Calcific atherosclerosis without evidence of a hyperdense vessel. Skull: No acute fracture.  Prior left frontal  craniotomy. Sinuses/Orbits: Visualized sinuses are largely clear. Small retention cyst in the right sphenoid sinus. Unremarkable orbits on limited assessment. Other: No mastoid effusions. IMPRESSION: 1. No substantial change in edema associated with the recent right corona radiata/basal ganglia infarct seen on May 25th MRI. No acute hemorrhage or progressive mass effect. 2. No evidence of interval/acute intracranial abnormality on this motion degraded exam. MRI could provide more sensitive evaluation for acute infarct if clinically indicated. Electronically Signed   By: Margaretha Sheffield MD   On: 04/10/2021 15:49   CT Head Wo Contrast  Result Date: 04/08/2021 CLINICAL DATA:  Weakness.  Falls. EXAM: CT HEAD WITHOUT CONTRAST TECHNIQUE: Contiguous axial images were obtained from the base of the skull through the vertex without intravenous contrast. COMPARISON:  Brain MRI 09/26/2020 FINDINGS: Brain: Cystic density in the left frontal region correlating with remote history of astrocytoma resection. Confluent chronic small vessel ischemia in the cerebral white matter with perforator infarct at the right basal ganglia on a chronic basis. Suspect accelerated disease from radiotherapy. No acute hemorrhage, hydrocephalus, or worrisome mass effect Vascular: Atheromatous calcification Skull: Diffuse heterogeneity of the skull, likely radiation effects. Unremarkable remote left frontal  craniotomy. Sinuses/Orbits: Negative IMPRESSION: 1. No acute finding. 2. Remote left cerebral glioma resection. 3. 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 Electronically Signed   By: Monte Fantasia M.D.   On: 04/08/2021 10:50   MR ANGIO HEAD WO CONTRAST  Result Date: 04/09/2021 CLINICAL DATA:  Acute right basal ganglia/corona radiata infarct on MRI. EXAM: MRA HEAD WITHOUT CONTRAST TECHNIQUE: Angiographic images of the Circle of Willis were acquired using MRA technique without intravenous contrast.  COMPARISON:  Head and neck CTA 09/26/2020 FINDINGS: Anterior circulation: The internal carotid arteries are widely patent from skull base to carotid termini. ACAs and MCAs are patent without evidence of a proximal branch occlusion or significant proximal stenosis. No aneurysm identified. Posterior circulation: The visualized distal vertebral arteries are widely patent to the basilar with the right being dominant. The left PICA and right AICA appear dominant. Patent SCAs are seen bilaterally. The basilar artery is widely patent. There is a fetal origin of the left PCA. Both PCAs are patent with branch vessel atherosclerosis including a moderate to severe proximal left P3 stenosis as seen on the prior CTA, however there is no significant P1 or proximal P2 stenosis. No aneurysm is identified. Anatomic variants: Fetal left PCA. IMPRESSION: No major intracranial branch occlusion or flow limiting proximal stenosis. Electronically Signed   By: Logan Bores M.D.   On: 04/09/2021 16:21   MR ANGIO NECK WO CONTRAST  Result Date: 04/09/2021 CLINICAL DATA:  Acute right basal ganglia/corona radiata infarct on MRI. EXAM: MRA NECK WITHOUT CONTRAST TECHNIQUE: Angiographic images of the neck were acquired using MRA technique without intravenous contrast. Carotid stenosis measurements (when applicable) are obtained utilizing NASCET criteria, using the distal internal carotid diameter as the denominator. COMPARISON:  Head and neck CTA 09/26/2020 FINDINGS: Aortic arch: The aortic arch was not included on this noncontrast examination. The included portions of the brachiocephalic and subclavian arteries are widely patent. Right carotid system: Patent without evidence of stenosis or dissection. Left carotid system: Patent without evidence of stenosis or dissection. Vertebral arteries: Patent with antegrade flow bilaterally and with the right vertebral artery being dominant. No evidence of a significant stenosis or dissection.  IMPRESSION: Negative neck MRA. Electronically Signed   By: Logan Bores M.D.   On: 04/09/2021 16:31   MR BRAIN WO CONTRAST  Result Date: 04/08/2021 CLINICAL DATA:  Following EXAM: MRI HEAD WITHOUT CONTRAST TECHNIQUE: Multiplanar, multiecho pulse sequences of the brain and surrounding structures were obtained without intravenous contrast. COMPARISON:  09/26/2020 FINDINGS: Motion artifact is present.  SWI was not obtained. Brain: There is restricted diffusion involving the right corona radiata extending from body of caudate toward the lentiform nucleus. This is anterior to a similarly oriented chronic infarct. There is a CSF intensity resection cavity of the left frontal lobe abutting or communicating with the left lateral ventricle. Ventricles and sulci are stable in size and configuration. No mass effect. Confluent areas of T2 hyperintensity in the supratentorial white matter are nonspecific and may reflect chronic microvascular ischemic changes and/or therapy related changes. Vascular: Major vessel flow voids at the skull base are preserved. Skull and upper cervical spine: Normal marrow signal is grossly preserved. Sinuses/Orbits: Paranasal sinuses are aerated. Orbits are unremarkable. Other: Sella is unremarkable.  Mastoid air cells are clear. IMPRESSION: Motion degraded study. Acute small vessel infarct involving right corona radiata and adjacent basal ganglia. Chronic findings detailed above. Electronically Signed   By: Macy Mis M.D.   On: 04/08/2021 12:42   DG CHEST PORT  1 VIEW  Result Date: 04/10/2021 CLINICAL DATA:  60 year old with cough. EXAM: PORTABLE CHEST 1 VIEW COMPARISON:  11/04/2020 FINDINGS: Lungs are clear. Heart size is within normal limits and stable. Patient is mildly rotated towards the left. Bone structures are unremarkable. Negative for a pneumothorax. IMPRESSION: No active disease. Electronically Signed   By: Markus Daft M.D.   On: 04/10/2021 14:26   EEG adult  Result Date:  04/10/2021 Lora Havens, MD     04/10/2021 10:17 AM Patient Name: Howard White MRN: [id number] Epilepsy Attending: Lora Havens Referring Physician/Provider: Tollie Eth, NP Date: 04/09/2021 Duration: Patient history: 60 year old male with left frontal craniotomy about 20 years ago, history of seizures who presented after multiple falls and excessive drowsiness.  EEG to evaluate for seizures. Level of alertness: Awake,asleep AEDs during EEG study: LTG Technical aspects: This EEG study was done with scalp electrodes positioned according to the 10-20 International system of electrode placement. Electrical activity was acquired at a sampling rate of 500Hz  and reviewed with a high frequency filter of 70Hz  and a low frequency filter of 1Hz . EEG data were recorded continuously and digitally stored. Description: The posterior dominant rhythm consists of 8 Hz activity of moderate voltage (25-35 uV) seen predominantly in posterior head regions, symmetric and reactive to eye opening and eye closing.  Sleep was characterized by vertex waves, sleep spindles (12 to 14 Hz), maximal frontocentral region.  EEG showed continuous generalized sharply contoured 3 to 5 Hz theta-delta slowing with overriding 15 to 18 Hz beta activity admixed with left frontocentral spikes, maximal F3/C3 consistent with underlying breach artifact.  Hyperventilation and photic stimulation were not performed.   ABNORMALITY -Spike, left frontocentral region -Breach artifact, left frontocentral region IMPRESSION: This study showed evidence of epileptogenicity and cortical dysfunction in left frontocentral region consistent with underlying craniotomy.  No seizures were seen throughout the recording. Lora Havens   ECHOCARDIOGRAM COMPLETE  Result Date: 04/08/2021    ECHOCARDIOGRAM REPORT   Patient Name:   Howard White Date of Exam: 04/08/2021 Medical Rec #:  [id number]    Height:       70.0 in Accession #:    [id number]   Weight:        188.0 lb Date of Birth:  [birth year]   BSA:          2.033 m Patient Age:    76 years     BP:           137/79 mmHg Patient Gender: M            HR:           74 bpm. Exam Location:  Inpatient Procedure: 2D Echo, Cardiac Doppler and Color Doppler Indications:    CVA  History:        Patient has prior history of Echocardiogram examinations, most                 recent 09/27/2020. Stroke. Brain cancer.  Sonographer:    Dustin Flock Referring Phys: [id number] Leslee Home  Sonographer Comments: No subcostal window. IMPRESSIONS  1. Left ventricular ejection fraction, by estimation, is 60 to 65%. The left ventricle has normal function. The left ventricle has no regional wall motion abnormalities. Left ventricular diastolic parameters are consistent with Grade I diastolic dysfunction (impaired relaxation).  2. Right ventricular systolic function is normal. The right ventricular size is normal.  3. The mitral valve is normal in structure. Trivial mitral valve regurgitation. No evidence of mitral  stenosis.  4. The aortic valve is normal in structure. Aortic valve regurgitation is not visualized. No aortic stenosis is present.  5. The inferior vena cava is normal in size with greater than 50% respiratory variability, suggesting right atrial pressure of 3 mmHg. FINDINGS  Left Ventricle: Left ventricular ejection fraction, by estimation, is 60 to 65%. The left ventricle has normal function. The left ventricle has no regional wall motion abnormalities. The left ventricular internal cavity size was normal in size. There is  no left ventricular hypertrophy. Left ventricular diastolic parameters are consistent with Grade I diastolic dysfunction (impaired relaxation). Normal left ventricular filling pressure. Right Ventricle: The right ventricular size is normal. No increase in right ventricular wall thickness. Right ventricular systolic function is normal. Left Atrium: Left atrial size was normal in size. Right Atrium: Right  atrial size was normal in size. Pericardium: There is no evidence of pericardial effusion. Mitral Valve: The mitral valve is normal in structure. Trivial mitral valve regurgitation. No evidence of mitral valve stenosis. Tricuspid Valve: The tricuspid valve is normal in structure. Tricuspid valve regurgitation is not demonstrated. No evidence of tricuspid stenosis. Aortic Valve: The aortic valve is normal in structure. Aortic valve regurgitation is not visualized. No aortic stenosis is present. Pulmonic Valve: The pulmonic valve was normal in structure. Pulmonic valve regurgitation is not visualized. No evidence of pulmonic stenosis. Aorta: The aortic root is normal in size and structure. Venous: The inferior vena cava is normal in size with greater than 50% respiratory variability, suggesting right atrial pressure of 3 mmHg. IAS/Shunts: No atrial level shunt detected by color flow Doppler.  LEFT VENTRICLE PLAX 2D LVIDd:         4.60 cm  Diastology LVIDs:         3.00 cm  LV e' medial:    6.85 cm/s LV PW:         1.10 cm  LV E/e' medial:  7.6 LV IVS:        0.90 cm  LV e' lateral:   8.92 cm/s LVOT diam:     2.30 cm  LV E/e' lateral: 5.8 LV SV:         81 LV SV Index:   40 LVOT Area:     4.15 cm  RIGHT VENTRICLE RV Basal diam:  2.90 cm RV S prime:     14.00 cm/s TAPSE (M-mode): 1.9 cm LEFT ATRIUM             Index       RIGHT ATRIUM           Index LA diam:        3.30 cm 1.62 cm/m  RA Area:     14.30 cm LA Vol (A2C):   37.2 ml 18.30 ml/m RA Volume:   33.20 ml  16.33 ml/m LA Vol (A4C):   34.6 ml 17.02 ml/m LA Biplane Vol: 36.3 ml 17.85 ml/m  AORTIC VALVE LVOT Vmax:   102.00 cm/s LVOT Vmean:  62.500 cm/s LVOT VTI:    0.195 m  AORTA Ao Root diam: 3.30 cm MITRAL VALVE MV Area (PHT): 4.89 cm    SHUNTS MV Decel Time: 155 msec    Systemic VTI:  0.20 m MV E velocity: 51.80 cm/s  Systemic Diam: 2.30 cm MV A velocity: 60.40 cm/s MV E/A ratio:  0.86 Mihai Croitoru MD Electronically signed by Sanda Klein MD Signature  Date/Time: 04/08/2021/4:28:55 PM    Final    VAS US CAROTID  Result  Date: 04/10/2021 Carotid Arterial Duplex Study Patient Name:  Howard White  Date of Exam:   04/09/2021 Medical Rec #: [id number]     Accession #:    [id number] Date of Birth: [birth year]    Patient Gender: M Patient Age:   59Y Exam Location:  [facility] Procedure:      VAS US CAROTID Referring Phys: [id number] Leslee Home --------------------------------------------------------------------------------  Performing Technologist: Darlin Coco RDMS,RVT  Examination Guidelines: A complete evaluation includes B-mode imaging, spectral Doppler, color Doppler, and power Doppler as needed of all accessible portions of each vessel. Bilateral testing is considered an integral part of a complete examination. Limited examinations for reoccurring indications may be performed as noted.  Right Carotid Findings: +----------+--------+--------+--------+------------------+------------------+           PSV cm/sEDV cm/sStenosisPlaque DescriptionComments           +----------+--------+--------+--------+------------------+------------------+ CCA Prox  136     25                                intimal thickening +----------+--------+--------+--------+------------------+------------------+ CCA Distal93      20                                intimal thickening +----------+--------+--------+--------+------------------+------------------+ ICA Prox  71      18                                intimal thickening +----------+--------+--------+--------+------------------+------------------+ ICA Distal81      26                                                   +----------+--------+--------+--------+------------------+------------------+ ECA       119     18                                                   +----------+--------+--------+--------+------------------+------------------+  +----------+--------+-------+--------+-------------------+           PSV cm/sEDV cmsDescribeArm Pressure (mmHG) +----------+--------+-------+--------+-------------------+ [id number]                                         +----------+--------+-------+--------+-------------------+ +---------+--------+--+--------+--+---------+ VertebralPSV cm/s54EDV cm/s17Antegrade +---------+--------+--+--------+--+---------+  Left Carotid Findings: +----------+--------+--------+--------+------------------+------------------+           PSV cm/sEDV cm/sStenosisPlaque DescriptionComments           +----------+--------+--------+--------+------------------+------------------+ CCA Prox  100     20                                intimal thickening +----------+--------+--------+--------+------------------+------------------+ CCA Distal103     22                                intimal thickening +----------+--------+--------+--------+------------------+------------------+ ICA Prox  58      21  intimal thickening +----------+--------+--------+--------+------------------+------------------+ ICA Distal68      23                                                   +----------+--------+--------+--------+------------------+------------------+ ECA       114     13                                intimal thickening +----------+--------+--------+--------+------------------+------------------+ +----------+--------+--------+--------+-------------------+           PSV cm/sEDV cm/sDescribeArm Pressure (mmHG) +----------+--------+--------+--------+-------------------+ [id number]                                         +----------+--------+--------+--------+-------------------+ +---------+--------+--+--------+-+---------+ VertebralPSV cm/s32EDV cm/s9Antegrade +---------+--------+--+--------+-+---------+   Summary: Right Carotid: The extracranial  vessels were near-normal with only minimal wall                thickening or plaque. Left Carotid: The extracranial vessels were near-normal with only minimal wall               thickening or plaque. Vertebrals: Bilateral vertebral arteries demonstrate antegrade flow. *See table(s) above for measurements and observations.  Electronically signed by Antony Contras MD on 04/10/2021 at 1:50:46 PM.    Final    VAS [place] TRANSCRANIAL DOPPLER  Result Date: 04/10/2021  Transcranial Doppler Patient Name:  Howard White  Date of Exam:   04/09/2021 Medical Rec #: [id number]     Accession #:    [id number] Date of Birth: 1961/06/17    Patient Gender: M Patient Age:   59Y Exam Location:  [facility] Procedure:      VAS [place] TRANSCRANIAL DOPPLER Referring Phys: [id number] Tollie Eth --------------------------------------------------------------------------------  Indications: Stroke. Limitations: Seizure disorder- patient unable to remain stationary for portions              of examination. Comparison Study: No prior studies. Performing Technologist: Darlin Coco RDMS,RVT  Examination Guidelines: A complete evaluation includes B-mode imaging, spectral Doppler, color Doppler, and power Doppler as needed of all accessible portions of each vessel. Bilateral testing is considered an integral part of a complete examination. Limited examinations for reoccurring indications may be performed as noted.  +----------+-------------+----------+-----------+-------+ RIGHT TCD Right VM (cm)Depth (cm)PulsatilityComment +----------+-------------+----------+-----------+-------+ MCA           40.00                 1.21            +----------+-------------+----------+-----------+-------+ ACA          -29.00                 0.97            +----------+-------------+----------+-----------+-------+ Term ICA      31.00                 1.58            +----------+-------------+----------+-----------+-------+ PCA            22.00                 1.16            +----------+-------------+----------+-----------+-------+ Opthalmic     30.00  1.44            +----------+-------------+----------+-----------+-------+ ICA siphon    26.00                 1.45            +----------+-------------+----------+-----------+-------+ Vertebral    -24.00                 1.00            +----------+-------------+----------+-----------+-------+ Distal ICA    22.00                 1.02            +----------+-------------+----------+-----------+-------+  +----------+------------+----------+-----------+-------+ LEFT TCD  Left VM (cm)Depth (cm)PulsatilityComment +----------+------------+----------+-----------+-------+ MCA          44.00                 1.05            +----------+------------+----------+-----------+-------+ ACA          -27.00                0.72            +----------+------------+----------+-----------+-------+ Term ICA     29.00                 1.10            +----------+------------+----------+-----------+-------+ PCA          35.00                 0.96            +----------+------------+----------+-----------+-------+ Opthalmic    24.00                 0.78            +----------+------------+----------+-----------+-------+ ICA siphon   29.00                 1.41            +----------+------------+----------+-----------+-------+ Vertebral    -19.00                0.77            +----------+------------+----------+-----------+-------+ Distal ICA   24.00                 0.95            +----------+------------+----------+-----------+-------+  +------------+-------+-------+             VM cm/sComment +------------+-------+-------+ Prox Basilar-38.00         +------------+-------+-------+ Dist Basilar-30.00         +------------+-------+-------+ +----------------------+----+ Right Lindegaard Ratio1.82  +----------------------+----+ +---------------------+----+ Left Lindegaard Ratio1.83 +---------------------+----+  Summary: This was a normal transcranial Doppler study, with normal flow direction and velocity of all identified vessels of the anterior and posterior circulations, with no evidence of stenosis, vasospasm or occlusion. There was no evidence of intracranial disease. *See table(s) above for TCD measurements and observations.  Diagnosing physician: Antony Contras MD Electronically signed by Antony Contras MD on 04/10/2021 at 1:51:13 PM.    Final     Microbiology: Recent Results (from the past 240 hour(s))  Urine culture     Status: Abnormal   Collection Time: 04/08/21  1:28 PM   Specimen: Urine, Random  Result Value Ref Range Status   Specimen Description URINE, RANDOM  Final   Special Requests   Final    NONE Performed at [facility]  [facility], [address], [place], [place] [address]    Culture >=100,000 COLONIES/mL STAPHYLOCOCCUS HAEMOLYTICUS (A)  Final   Report Status 04/10/2021 FINAL  Final   Organism ID, Bacteria STAPHYLOCOCCUS HAEMOLYTICUS (A)  Final      Susceptibility   Staphylococcus haemolyticus - MIC*    CIPROFLOXACIN <=0.5 SENSITIVE Sensitive     GENTAMICIN <=0.5 SENSITIVE Sensitive     NITROFURANTOIN <=16 SENSITIVE Sensitive     OXACILLIN <=0.25 SENSITIVE Sensitive     TETRACYCLINE <=1 SENSITIVE Sensitive     VANCOMYCIN 1 SENSITIVE Sensitive     TRIMETH/SULFA <=10 SENSITIVE Sensitive     CLINDAMYCIN <=0.25 SENSITIVE Sensitive     RIFAMPIN <=0.5 SENSITIVE Sensitive     Inducible Clindamycin NEGATIVE Sensitive     * >=100,000 COLONIES/mL STAPHYLOCOCCUS HAEMOLYTICUS  Resp Panel by RT-PCR (Flu A&B, Covid) Nasopharyngeal Swab     Status: None   Collection Time: 04/08/21  1:51 PM   Specimen: Nasopharyngeal Swab; Nasopharyngeal(NP) swabs in vial transport medium  Result Value Ref Range Status   SARS Coronavirus 2 by RT PCR NEGATIVE NEGATIVE Final    Comment:  (NOTE) SARS-CoV-2 target nucleic acids are NOT DETECTED.  The SARS-CoV-2 RNA is generally detectable in upper respiratory specimens during the acute phase of infection. The lowest concentration of SARS-CoV-2 viral copies this assay can detect is 138 copies/mL. A negative result does not preclude SARS-Cov-2 infection and should not be used as the sole basis for treatment or other patient management decisions. A negative result may occur with  improper specimen collection/handling, submission of specimen other than nasopharyngeal swab, presence of viral mutation(s) within the areas targeted by this assay, and inadequate number of viral copies(<138 copies/mL). A negative result must be combined with clinical observations, patient history, and epidemiological information. The expected result is Negative.  Fact Sheet for Patients:  EntrepreneurPulse.com.au  Fact Sheet for Healthcare Providers:  IncredibleEmployment.be  This test is no t yet approved or cleared by the [place] FDA and  has been authorized for detection and/or diagnosis of SARS-CoV-2 by FDA under an Emergency Use Authorization (EUA). This EUA will remain  in effect (meaning this test can be used) for the duration of the COVID-19 declaration under Section 564(b)(1) of the Act, 21 U.S.C.section 360bbb-3(b)(1), unless the authorization is terminated  or revoked sooner.       Influenza A by PCR NEGATIVE NEGATIVE Final   Influenza B by PCR NEGATIVE NEGATIVE Final    Comment: (NOTE) The Xpert Xpress SARS-CoV-2/FLU/RSV plus assay is intended as an aid in the diagnosis of influenza from Nasopharyngeal swab specimens and should not be used as a sole basis for treatment. Nasal washings and aspirates are unacceptable for Xpert Xpress SARS-CoV-2/FLU/RSV testing.  Fact Sheet for Patients: EntrepreneurPulse.com.au  Fact Sheet for Healthcare  Providers: IncredibleEmployment.be  This test is not yet approved or cleared by the [place] FDA and has been authorized for detection and/or diagnosis of SARS-CoV-2 by FDA under an Emergency Use Authorization (EUA). This EUA will remain in effect (meaning this test can be used) for the duration of the COVID-19 declaration under Section 564(b)(1) of the Act, 21 U.S.C. section 360bbb-3(b)(1), unless the authorization is terminated or revoked.  Performed at [facility], [place] [address] [place], [place] [address]      Labs: Basic Metabolic Panel: Recent Labs  Lab 04/08/21 0934 04/09/21 0053 04/10/21 0939  NA 137 134* 138  K 4.1 3.8 3.9  CL 103 102 104  CO2 24  24 28  GLUCOSE 97 121* 101*  BUN 17 13 13   CREATININE 1.08 1.16 1.04  CALCIUM 9.3 9.1 9.4   Liver Function Tests: Recent Labs  Lab 04/08/21 0934 04/09/21 0053  AST 22 23  ALT 23 21  ALKPHOS 70 70  BILITOT 1.0 0.9  PROT 5.9* 5.9*  ALBUMIN 3.9 3.7   No results for input(s): LIPASE, AMYLASE in the last 168 hours. No results for input(s): AMMONIA in the last 168 hours. CBC: Recent Labs  Lab 04/08/21 0934 04/09/21 0053 04/10/21 0939  WBC 6.6 8.6 6.8  NEUTROABS 3.9  --  4.4  HGB 15.4 15.3 15.9  HCT 45.6 44.1 46.6  MCV 94.0 91.3 91.7  PLT 279 264 291   Cardiac Enzymes: No results for input(s): CKTOTAL, CKMB, CKMBINDEX, TROPONINI in the last 168 hours. BNP: BNP (last 3 results) No results for input(s): BNP in the last 8760 hours.  ProBNP (last 3 results) No results for input(s): PROBNP in the last 8760 hours.  CBG: Recent Labs  Lab 04/08/21 0952  GLUCAP 96       Signed:  [facility] 04/14/2021, 10:14 AM

## 2021-04-14 NOTE — H&P (Signed)
Physical Medicine and Rehabilitation Admission H&P     CC: Stroke with functional deficits.      HPI: Howard White is a 60 year old male with history of left frontal craniotomy s/p astrocytoma resection, seizure disorder, CVA 11/21 with residual left hemiplegia who was admitted on 04/08/2021 with reports of multiple falls, increasing sleepiness with decrease in interaction and progressive weakness.  CT head was negative for acute finding and showed advanced small vessel disease as well as remote left cerebral glioma resection.  MRI brain done showing acute small vessel infarct involving right corona radiata and adjacent basal ganglia.  MRA was negative for occlusion or stenosis.  He has had issues with tremors right upper extremity but no evidence of seizure per neurology.  EEG done showing evidence of epileptogenicity and cortical dysfunction left frontocentral region c/w underlying craniotomy but no seizures seen during recording.  Carotid Dopplers were negative for significant ICA stenosis.  2D echo showed EF of 60 to 65% no wall abnormality.    Dr. Leonie Man felt that stroke was secondary to small vessel disease and recommended DAPT x3 weeks followed by ASA alone. Speech therapy evaluations revealed cognitive motor speech deficits with decreased attention and reported to be worse since stroke.  Therapy ongoing and patient noted to have balance deficits with decrease in motor coordination RUE/RLE, tendency to push to left and posteriorly as well as anxiety, restlessness and poor safety awareness.  At baseline patient was wheelchair bound but was working on standing attempt with outpatient therapy.  Family assist with ADL tasks.Marland Kitchen  He continues to be limited by left hemiplegia with spasticity and CIR was recommended to follow plan.   Pt whispered, but was very hard ot understand; also not following more than 50% of commands, did say the stroke "made his pride hurt".  Was able to tell me he had a  stroke.      Review of Systems  Unable to perform ROS: Medical condition            Past Medical History:  Diagnosis Date  . Brain cancer ([place])    . Seizures ([place])    . Thyroid disease      Hypothyroid           Past Surgical History:  Procedure Laterality Date  . brain cancer      . BRAIN SURGERY               Family History  Problem Relation Age of Onset  . Brain cancer Other    . Thyroid disease Other    . Cancer Other    . Diabetes Other    . Stroke Other        Social History:  Lives with parents. Per reports that he has never smoked. He has never used smokeless tobacco.  Per reports current alcohol use and that he does not use drugs.          Allergies  Allergen Reactions  . Lactose Intolerance (Gi)      Medications Prior to Admission  Medication Sig Dispense Refill  . acetaminophen (TYLENOL) 325 MG tablet Take 2 tablets (650 mg total) by mouth every 6 (six) hours as needed for mild pain (or Fever >/= 101).      Marland Kitchen aspirin EC 81 MG EC tablet Take 1 tablet (81 mg total) by mouth daily. Swallow whole. (Patient taking differently: Take 81 mg by mouth every evening. Swallow whole.) 30 tablet 11  .  atorvastatin (LIPITOR) 40 MG tablet Take 40 mg by mouth every evening.      . Baclofen 5 MG TABS Take 5-10 mg by mouth as directed. Take 1 tablet (5 mg) in the morning and Take 2 tablets (10 mg) at bedtime      . calcium carbonate (TUMS - DOSED IN MG ELEMENTAL CALCIUM) 500 MG chewable tablet Chew 1 tablet (200 mg of elemental calcium total) by mouth daily. (Patient taking differently: Chew 1 tablet by mouth daily as needed for indigestion.) 30 tablet 0  . docusate sodium (COLACE) 100 MG capsule Take 2 capsules (200 mg total) by mouth 2 (two) times daily. (Patient taking differently: Take 200 mg by mouth 2 (two) times daily as needed for mild constipation.) 10 capsule 0  . escitalopram (LEXAPRO) 10 MG tablet Take 1 tablet (10 mg total) by mouth daily. 30 tablet 0  .  famotidine (PEPCID) 20 MG tablet Take 1 tablet (20 mg total) by mouth daily. 30 tablet 0  . famotidine (PEPCID) 20 MG tablet Take 20 mg by mouth daily.      . fluticasone (FLONASE) 50 MCG/ACT nasal spray Place 1 spray into both nostrils 2 (two) times daily as needed for allergies.      Marland Kitchen ketotifen (ZADITOR) 0.025 % ophthalmic solution Place 1 drop into both eyes 2 (two) times daily as needed (for itchy eyes). 5 mL 0  . lamoTRIgine (LAMICTAL) 150 MG tablet TAKE 1 AND 1/2 TABS EVERY MORNING AND 2 TAB EVERY EVENING (Patient taking differently: Take 225-300 mg by mouth See admin instructions. TAKE 1 AND 1/2 TABS ( 225mg )  EVERY MORNING AND 2 TAB (300mg ) EVERY EVENING) 315 tablet 3  . melatonin 3 MG TABS tablet Take 2 tablets (6 mg total) by mouth at bedtime. (Patient taking differently: Take 6 mg by mouth at bedtime as needed (sleep).) 30 tablet 0  . Multiple Vitamins-Minerals (MULTIVITAMIN WITH MINERALS) tablet Take 1 tablet by mouth daily.      . polyethylene glycol (MIRALAX / GLYCOLAX) 17 g packet Take 17 g by mouth daily. (Patient taking differently: Take 17 g by mouth daily as needed for moderate constipation.) 14 each 0  . SYNTHROID 112 MCG tablet Take 1 tablet (112 mcg total) by mouth at bedtime. 30 tablet 0      Drug Regimen Review  Drug regimen was reviewed and remains appropriate with no significant issues identified   Home: Home Living Family/patient expects to be discharged to:: Skilled nursing facility Living Arrangements: Parent Available Help at Discharge: Family,Available 24 hours/day Type of Home: House Home Access: Level entry Home Layout: Two level,Able to live on main level with bedroom/bathroom Bathroom Shower/Tub: Chiropodist: Standard Bathroom Accessibility: Yes Home Equipment: Wheelchair - manual,Cane - single point,Tub bench,Hand held shower head Additional Comments: attends outpatient therapy  Lives With: Family   Functional History: Prior  Function Level of Independence: Needs assistance Gait / Transfers Assistance Needed: Primarily using w/c. Father light assistance for functional mobility in home with SPC. ADL's / Homemaking Assistance Needed: Performs dressing and BADLs; father assists with bathing   Functional Status:  Mobility: Bed Mobility Overal bed mobility: Needs Assistance Bed Mobility: Supine to Sit,Sit to Supine Supine to sit: Mod assist,+2 for physical assistance,+2 for safety/equipment Sit to supine: Max assist,+2 for physical assistance,+2 for safety/equipment General bed mobility comments: Difficulty sequencing and required increased time and assistance this session. incoordination of R UE and LE noted during transitions. Cues for deep breathing and relaxation to  calm patient Transfers Overall transfer level: Needs assistance Equipment used: 1 person hand held assist Transfer via Lift Equipment: Stedy Transfers: Sit to/from [employer] to Stand: +2 physical assistance,+2 safety/equipment,Mod assist Stand pivot transfers: Total assist,+2 physical assistance,+2 safety/equipment General transfer comment: Mod A +2 for power up and then blocking of L knee. As pt became more fatigued in standing, requiring Max A for posterior lean Ambulation/Gait Ambulation/Gait assistance: Mod assist,+2 physical assistance,+2 safety/equipment Gait Distance (Feet): 5 Feet Assistive device: 1 person hand held assist Gait Pattern/deviations: Step-to pattern,Decreased step length - right,Decreased step length - left,Decreased stride length,Decreased weight shift to right,Decreased weight shift to left,Shuffle,Leaning posteriorly,Narrow base of support General Gait Details: deferred due to poor sitting balance Gait velocity: decreased   ADL: ADL Overall ADL's : Needs assistance/impaired Eating/Feeding: Moderate assistance,Sitting Grooming: Moderate assistance,Sitting,Min guard Grooming Details (indicate cue  type and reason): Mod A for bilateral coorindation. Min A for managing wash clothe Upper Body Bathing: Moderate assistance,Sitting Lower Body Bathing: Maximal assistance,+2 for physical assistance,Sit to/from stand Upper Body Dressing : Moderate assistance,Sitting Lower Body Dressing: Maximal assistance,+2 for physical assistance,Sit to/from stand Toilet Transfer: Cueing for sequencing,Minimal assistance,+2 for physical assistance (sit<>stand with stedy) Toilet Transfer Details (indicate cue type and reason): Min A +2 for power up into standing with use of stedy. Continues to persent with LLE extension and LUE flexion patterns Functional mobility during ADLs: Moderate assistance,+2 for physical assistance (sit<>stand) General ADL Comments: Focused session on splint check and sitting balance. Pt presenting with decreased balance, coorindation, strength, and cognition compared to yesterdar's session   Cognition: Cognition Overall Cognitive Status: Impaired/Different from baseline Arousal/Alertness: Awake/alert Orientation Level: Oriented X4 Attention: Sustained Sustained Attention: Impaired Sustained Attention Impairment: Functional basic,Verbal basic Memory:  (difficult to assess) Behaviors: Restless Safety/Judgment: Impaired Cognition Arousal/Alertness: Lethargic,Awake/alert Behavior During Therapy: Restless,Impulsive,Anxious,Flat affect Overall Cognitive Status: Impaired/Different from baseline Area of Impairment: Attention,Memory,Following commands,Safety/judgement,Awareness,Problem solving Orientation Level: Disoriented to,Time Current Attention Level: Focused,Sustained (very short sustained attention; quickly internally distracted) Memory: Decreased short-term memory Following Commands: Follows one step commands with increased time,Follows one step commands inconsistently Safety/Judgement: Decreased awareness of safety,Decreased awareness of deficits Awareness:  Intellectual Problem Solving: Slow processing,Difficulty sequencing,Requires verbal cues,Requires tactile cues General Comments: Decreased attention this session compared to last with frequent cueing and increased time. Patient with increasing coordination deficits especially on R side this session requiring max cues and assistance to safely sit EOB. Pt internally distracted and perseverating on certain phrases such as "chest up chest up" or "head high head high."   Physical Exam: Blood pressure 131/82, pulse 92, temperature 98.1 F (36.7 C), temperature source Oral, resp. rate 19, height 5\' 10"  (1.778 m), weight 85.3 kg, SpO2 99 %. Physical Exam Vitals and nursing note reviewed.  Constitutional:      Comments: Thin male, lying sideways in bed, naked but covered with sheet and condom catheter tubing lying on the floor.   Pt twisted up in blanket, but lying naked in bed otherwise, depends also twisted up- bed was wet underneath his torso, but appeared to be sweat; whispering short phrases to communicate occ, NAD  HENT:     Head: Normocephalic.     Comments: L facial droop- whispering a few phrases; couldn't participate in sticking tongue out.    Right Ear: External ear normal.     Left Ear: External ear normal.     Nose: Nose normal. No congestion.     Mouth/Throat:     Mouth: Mucous membranes are dry.  Pharynx: Oropharynx is clear. No oropharyngeal exudate.  Eyes:     General:        Right eye: No discharge.        Left eye: No discharge.     Comments: Kept closing eyes- not able to check EOMs  Cardiovascular:     Rate and Rhythm: Normal rate and regular rhythm.     Heart sounds: Normal heart sounds. No murmur heard. No gallop.   Pulmonary:     Comments: CTA B/L- no W/R/R- good air movement  Abdominal:     Comments: Had scratch on RUQ- Soft, NT, ND, (+)BS    Genitourinary:    Comments: Condom catheter tubing detached and on floor Musculoskeletal:     Cervical back: Normal  range of motion and neck supple.     Comments: Healing abrasions on left knee/shin.  Posturing greatly with LUE- wrist flexed, bicep flexed, shoulder hiked up; @nd  digit pointing, otherwise, fingers curled constantly moving around in bed- using R side Spontaneous movement on R side appears to be at least 4/5 LUE- no movement to command (also bad tone) LLE- at least 3/5 on HF/DF and PF Couldn't bend/flex L knee at all- kept going into extensor tone.   Skin:    General: Skin is warm and dry.     Comments: Abdominal bruising- from lovenox No skin breakdown other than that, bruising on knee and scratch on RUQ  Neurological:     Mental Status: He is alert.     Comments: RUE and upper body with spastic movements.  Internally distracted.  Soft voice with dysarthria but able to answer simple biographic questions.  LUE flexed with fist at chest (pressure area noted) and LLE with extensor tone. LUE MAS of 4 - flexion spasticity as described as above Extensor tone making impossible to flex L knee-  Only thing he told me was that had a stroke- kept mumbling to himself  Psychiatric:     Comments: Flat        Lab Results Last 48 Hours  No results found for this or any previous visit (from the past 48 hour(s)).   Imaging Results (Last 48 hours)  No results found.           Medical Problem List and Plan: 1.  L hemiplegia worsening due to 3 strokes in total -has associated spasticity and contracture formation              -patient may  Shower?             -ELOS/Goals:min A 17-20 days 2.  Antithrombotics: -DVT/anticoagulation:  Pharmaceutical: Lovenox             -antiplatelet therapy: DAPT x3 weeks followed by ASA alone. 3. Pain Management: Tylenol as needed 4. Mood: LCSW to follow for evaluation and support             -antipsychotic agents: N/AA 5. Neuropsych: This patient is not fully capable of making decisions on his own behalf. 6. Skin/Wound Care: Routine pressure-relief  measures. 7. Fluids/Electrolytes/Nutrition: Monitor intake/output.  Check CMET in am.  8.  History of seizure disorder: Continue Lamictal twice daily 9.  Left spastic hemiparesis: Continue baclofen 5 mg 3 times daily--> titrated upwards? 10.  History of anxiety disorder: Continue Lexapro.  Hydroxyzine 3 times daily. 11.  Insomnia: Continue melatonin at bedtime. 12.  Hypothyroid: Stable on supplement. 13. Spasticity/forming contractures- last saw Dr Letta Pate in April- likely not due to Botox yet- pt might  benefit from ITB pump long term?   Howard Leriche, PA-C 04/14/2021    I have personally performed a face to face diagnostic evaluation of this patient and formulated the key components of the plan.  Additionally, I have personally reviewed laboratory data, imaging studies, as well as relevant notes and concur with the physician assistant's documentation above.   The patient's status has not changed from the original H&P.  Any changes in documentation from the acute care chart have been noted above.

## 2021-04-14 NOTE — Progress Notes (Signed)
Inpatient Rehabilitation Admissions Coordinator  I met with patient and his parents at bedside. I have a CIR bed to admit patient to today. I have alerted acute team and TOC. I will make the arrangements to admit today.  Danne Baxter, RN, MSN Rehab Admissions Coordinator [phone] 04/14/2021 10:00 AM

## 2021-04-14 NOTE — Progress Notes (Signed)
  Speech Language Pathology Treatment: Dysphagia;Cognitive-Linquistic  Patient Details Name: Howard White MRN: [id number] DOB: 09/29/1961 Today's Date: 04/14/2021 Time: 0881-1031 SLP Time Calculation (min) (ACUTE ONLY): 20 min  Assessment / Plan / Recommendation Clinical Impression  Pt seen during am meal for tolerance of regular/thin liquid diet. Pt alert and upright in bed, however positioning decreased to the left overtime requiring verbal cueing for pt to realign upright. Trials of regular textures resulted in mild pocketing in L lateral sulcus in x1 instance, which pt cleared with liquid wash in combination with cued lingual/finger sweep. Pt very impulsive with straw sips of thin, which often resulted in overt coughing and L anterior spillage, but improved with consistent verbal cues for small sips. While swallow function continues to be impaired, cognition also appears to impact pt ability to remain attentive to PO at times, which decreases awareness to intake. Recommend pt continue on current diet with staff to implement small bites/sips and cues for liquid wash and finger/lingual sweep to decrease risk for aspiration. Throughout session, pt speech intelligibility roughly 50% at the sentence level. With frequent verbal cues and models, pt spoke in 2-3 word phrases utilizing over articulation and slow rate of speech, which increased intelligibility to 75-100%. SLP to continue to follow acutely.    HPI HPI: 60 yo male presenting to ED due to 2-3 falls and BLE weakness. MRI (04/08/21)  acute small vessel infarct involving R corona radiata and basal ganglia. Passed [facility] 04/08/21 and placed on regular diet. Mother concerned for aspiration on 04/10/21 when SLP arrived for SLE. PMH including astrocytoma with left frontal craniotomy resection 20 years ago, seizure disorder, hypothyroidism, history of CVA with [name] in November 2021 (acute/CIR ST services provided for cognitive skills/motor speech),  spastic bladder, and ambulatory dysfunction.      SLP Plan  Continue with current plan of care       Recommendations  Diet recommendations: Regular;Thin liquid Liquids provided via: Cup;Straw Medication Administration: Whole meds with puree Supervision: Full supervision/cueing for compensatory strategies;Staff to assist with self feeding Compensations: Minimize environmental distractions;Slow rate;Small sips/bites;Lingual sweep for clearance of pocketing;Monitor for anterior loss;Follow solids with liquid Postural Changes and/or Swallow Maneuvers: Seated upright 90 degrees                Oral Care Recommendations: Oral care BID Follow up Recommendations: Inpatient Rehab SLP Visit Diagnosis: Dysphagia, unspecified (R13.10);Dysarthria and anarthria (R47.1) Plan: Continue with current plan of care       [place], [place], [place] Office Number: [phone]  [name] 04/14/2021, 9:05 AM

## 2021-04-14 NOTE — H&P (Signed)
Physical Medicine and Rehabilitation Admission H&P    CC: Stroke with functional deficits.    HPI: Howard White is a 60 year old male with history of left frontal craniotomy s/p astrocytoma resection, seizure disorder, CVA 11/21 with residual left hemiplegia who was admitted on 04/08/2021 with reports of multiple falls, increasing sleepiness with decrease in interaction and progressive weakness.  CT head was negative for acute finding and showed advanced small vessel disease as well as remote left cerebral glioma resection.  MRI brain done showing acute small vessel infarct involving right corona radiata and adjacent basal ganglia.  MRA was negative for occlusion or stenosis.  He has had issues with tremors right upper extremity but no evidence of seizure per neurology.  EEG done showing evidence of epileptogenicity and cortical dysfunction left frontocentral region c/w underlying craniotomy but no seizures seen during recording.  Carotid Dopplers were negative for significant ICA stenosis.  2D echo showed EF of 60 to 65% no wall abnormality.   Dr. Leonie Man felt that stroke was secondary to small vessel disease and recommended DAPT x3 weeks followed by ASA alone. Speech therapy evaluations revealed cognitive motor speech deficits with decreased attention and reported to be worse since stroke.  Therapy ongoing and patient noted to have balance deficits with decrease in motor coordination RUE/RLE, tendency to push to left and posteriorly as well as anxiety, restlessness and poor safety awareness.  At baseline patient was wheelchair bound but was working on standing attempt with outpatient therapy.  Family assist with ADL tasks.Marland Kitchen  He continues to be limited by left hemiplegia with spasticity and CIR was recommended to follow plan.  Pt whispered, but was very hard ot understand; also not following more than 50% of commands, did say the stroke "made his pride hurt".  Was able to tell me he had a stroke.     Review of Systems  Unable to perform ROS: Medical condition      Past Medical History:  Diagnosis Date  . Brain cancer ([place])   . Seizures ([place])   . Thyroid disease    Hypothyroid    Past Surgical History:  Procedure Laterality Date  . brain cancer    . BRAIN SURGERY      Family History  Problem Relation Age of Onset  . Brain cancer Other   . Thyroid disease Other   . Cancer Other   . Diabetes Other   . Stroke Other     Social History:  Lives with parents. Per reports that he has never smoked. He has never used smokeless tobacco.  Per reports current alcohol use and that he does not use drugs.    Allergies  Allergen Reactions  . Lactose Intolerance (Gi)    Medications Prior to Admission  Medication Sig Dispense Refill  . acetaminophen (TYLENOL) 325 MG tablet Take 2 tablets (650 mg total) by mouth every 6 (six) hours as needed for mild pain (or Fever >/= 101).    Marland Kitchen aspirin EC 81 MG EC tablet Take 1 tablet (81 mg total) by mouth daily. Swallow whole. (Patient taking differently: Take 81 mg by mouth every evening. Swallow whole.) 30 tablet 11  . atorvastatin (LIPITOR) 40 MG tablet Take 40 mg by mouth every evening.    . Baclofen 5 MG TABS Take 5-10 mg by mouth as directed. Take 1 tablet (5 mg) in the morning and Take 2 tablets (10 mg) at bedtime    . calcium carbonate (TUMS - DOSED IN  MG ELEMENTAL CALCIUM) 500 MG chewable tablet Chew 1 tablet (200 mg of elemental calcium total) by mouth daily. (Patient taking differently: Chew 1 tablet by mouth daily as needed for indigestion.) 30 tablet 0  . docusate sodium (COLACE) 100 MG capsule Take 2 capsules (200 mg total) by mouth 2 (two) times daily. (Patient taking differently: Take 200 mg by mouth 2 (two) times daily as needed for mild constipation.) 10 capsule 0  . escitalopram (LEXAPRO) 10 MG tablet Take 1 tablet (10 mg total) by mouth daily. 30 tablet 0  . famotidine (PEPCID) 20 MG tablet Take 1 tablet (20 mg total) by mouth  daily. 30 tablet 0  . famotidine (PEPCID) 20 MG tablet Take 20 mg by mouth daily.    . fluticasone (FLONASE) 50 MCG/ACT nasal spray Place 1 spray into both nostrils 2 (two) times daily as needed for allergies.    Marland Kitchen ketotifen (ZADITOR) 0.025 % ophthalmic solution Place 1 drop into both eyes 2 (two) times daily as needed (for itchy eyes). 5 mL 0  . lamoTRIgine (LAMICTAL) 150 MG tablet TAKE 1 AND 1/2 TABS EVERY MORNING AND 2 TAB EVERY EVENING (Patient taking differently: Take 225-300 mg by mouth See admin instructions. TAKE 1 AND 1/2 TABS ( 225mg )  EVERY MORNING AND 2 TAB (300mg ) EVERY EVENING) 315 tablet 3  . melatonin 3 MG TABS tablet Take 2 tablets (6 mg total) by mouth at bedtime. (Patient taking differently: Take 6 mg by mouth at bedtime as needed (sleep).) 30 tablet 0  . Multiple Vitamins-Minerals (MULTIVITAMIN WITH MINERALS) tablet Take 1 tablet by mouth daily.    . polyethylene glycol (MIRALAX / GLYCOLAX) 17 g packet Take 17 g by mouth daily. (Patient taking differently: Take 17 g by mouth daily as needed for moderate constipation.) 14 each 0  . SYNTHROID 112 MCG tablet Take 1 tablet (112 mcg total) by mouth at bedtime. 30 tablet 0    Drug Regimen Review  Drug regimen was reviewed and remains appropriate with no significant issues identified  Home: Home Living Family/patient expects to be discharged to:: Skilled nursing facility Living Arrangements: Parent Available Help at Discharge: Family,Available 24 hours/day Type of Home: House Home Access: Level entry Home Layout: Two level,Able to live on main level with bedroom/bathroom Bathroom Shower/Tub: Chiropodist: Standard Bathroom Accessibility: Yes Home Equipment: Wheelchair - manual,Cane - single point,Tub bench,Hand held shower head Additional Comments: attends outpatient therapy  Lives With: Family   Functional History: Prior Function Level of Independence: Needs assistance Gait / Transfers Assistance  Needed: Primarily using w/c. Father light assistance for functional mobility in home with SPC. ADL's / Homemaking Assistance Needed: Performs dressing and BADLs; father assists with bathing  Functional Status:  Mobility: Bed Mobility Overal bed mobility: Needs Assistance Bed Mobility: Supine to Sit,Sit to Supine Supine to sit: Mod assist,+2 for physical assistance,+2 for safety/equipment Sit to supine: Max assist,+2 for physical assistance,+2 for safety/equipment General bed mobility comments: Difficulty sequencing and required increased time and assistance this session. incoordination of R UE and LE noted during transitions. Cues for deep breathing and relaxation to calm patient Transfers Overall transfer level: Needs assistance Equipment used: 1 person hand held assist Transfer via Lift Equipment: Stedy Transfers: Sit to/from [employer] to Stand: +2 physical assistance,+2 safety/equipment,Mod assist Stand pivot transfers: Total assist,+2 physical assistance,+2 safety/equipment General transfer comment: Mod A +2 for power up and then blocking of L knee. As pt became more fatigued in standing, requiring Max A  for posterior lean Ambulation/Gait Ambulation/Gait assistance: Mod assist,+2 physical assistance,+2 safety/equipment Gait Distance (Feet): 5 Feet Assistive device: 1 person hand held assist Gait Pattern/deviations: Step-to pattern,Decreased step length - right,Decreased step length - left,Decreased stride length,Decreased weight shift to right,Decreased weight shift to left,Shuffle,Leaning posteriorly,Narrow base of support General Gait Details: deferred due to poor sitting balance Gait velocity: decreased    ADL: ADL Overall ADL's : Needs assistance/impaired Eating/Feeding: Moderate assistance,Sitting Grooming: Moderate assistance,Sitting,Min guard Grooming Details (indicate cue type and reason): Mod A for bilateral coorindation. Min A for managing wash  clothe Upper Body Bathing: Moderate assistance,Sitting Lower Body Bathing: Maximal assistance,+2 for physical assistance,Sit to/from stand Upper Body Dressing : Moderate assistance,Sitting Lower Body Dressing: Maximal assistance,+2 for physical assistance,Sit to/from stand Toilet Transfer: Cueing for sequencing,Minimal assistance,+2 for physical assistance (sit<>stand with stedy) Toilet Transfer Details (indicate cue type and reason): Min A +2 for power up into standing with use of stedy. Continues to persent with LLE extension and LUE flexion patterns Functional mobility during ADLs: Moderate assistance,+2 for physical assistance (sit<>stand) General ADL Comments: Focused session on splint check and sitting balance. Pt presenting with decreased balance, coorindation, strength, and cognition compared to yesterdar's session  Cognition: Cognition Overall Cognitive Status: Impaired/Different from baseline Arousal/Alertness: Awake/alert Orientation Level: Oriented X4 Attention: Sustained Sustained Attention: Impaired Sustained Attention Impairment: Functional basic,Verbal basic Memory:  (difficult to assess) Behaviors: Restless Safety/Judgment: Impaired Cognition Arousal/Alertness: Lethargic,Awake/alert Behavior During Therapy: Restless,Impulsive,Anxious,Flat affect Overall Cognitive Status: Impaired/Different from baseline Area of Impairment: Attention,Memory,Following commands,Safety/judgement,Awareness,Problem solving Orientation Level: Disoriented to,Time Current Attention Level: Focused,Sustained (very short sustained attention; quickly internally distracted) Memory: Decreased short-term memory Following Commands: Follows one step commands with increased time,Follows one step commands inconsistently Safety/Judgement: Decreased awareness of safety,Decreased awareness of deficits Awareness: Intellectual Problem Solving: Slow processing,Difficulty sequencing,Requires verbal  cues,Requires tactile cues General Comments: Decreased attention this session compared to last with frequent cueing and increased time. Patient with increasing coordination deficits especially on R side this session requiring max cues and assistance to safely sit EOB. Pt internally distracted and perseverating on certain phrases such as "chest up chest up" or "head high head high."  Physical Exam: Blood pressure 131/82, pulse 92, temperature 98.1 F (36.7 C), temperature source Oral, resp. rate 19, height 5\' 10"  (1.778 m), weight 85.3 kg, SpO2 99 %. Physical Exam Vitals and nursing note reviewed.  Constitutional:      Comments: Thin male, lying sideways in bed, naked but covered with sheet and condom catheter tubing lying on the floor.   Pt twisted up in blanket, but lying naked in bed otherwise, depends also twisted up- bed was wet underneath his torso, but appeared to be sweat; whispering short phrases to communicate occ, NAD  HENT:     Head: Normocephalic.     Comments: L facial droop- whispering a few phrases; couldn't participate in sticking tongue out.    Right Ear: External ear normal.     Left Ear: External ear normal.     Nose: Nose normal. No congestion.     Mouth/Throat:     Mouth: Mucous membranes are dry.     Pharynx: Oropharynx is clear. No oropharyngeal exudate.  Eyes:     General:        Right eye: No discharge.        Left eye: No discharge.     Comments: Kept closing eyes- not able to check EOMs  Cardiovascular:     Rate and Rhythm: Normal rate and regular rhythm.  Heart sounds: Normal heart sounds. No murmur heard. No gallop.   Pulmonary:     Comments: CTA B/L- no W/R/R- good air movement  Abdominal:     Comments: Had scratch on RUQ- Soft, NT, ND, (+)BS    Genitourinary:    Comments: Condom catheter tubing detached and on floor Musculoskeletal:     Cervical back: Normal range of motion and neck supple.     Comments: Healing abrasions on left knee/shin.   Posturing greatly with LUE- wrist flexed, bicep flexed, shoulder hiked up; @nd  digit pointing, otherwise, fingers curled constantly moving around in bed- using R side Spontaneous movement on R side appears to be at least 4/5 LUE- no movement to command (also bad tone) LLE- at least 3/5 on HF/DF and PF Couldn't bend/flex L knee at all- kept going into extensor tone.   Skin:    General: Skin is warm and dry.     Comments: Abdominal bruising- from lovenox No skin breakdown other than that, bruising on knee and scratch on RUQ  Neurological:     Mental Status: He is alert.     Comments: RUE and upper body with spastic movements.  Internally distracted.  Soft voice with dysarthria but able to answer simple biographic questions.  LUE flexed with fist at chest (pressure area noted) and LLE with extensor tone. LUE MAS of 4 - flexion spasticity as described as above Extensor tone making impossible to flex L knee-  Only thing he told me was that had a stroke- kept mumbling to himself  Psychiatric:     Comments: Flat     No results found for this or any previous visit (from the past 48 hour(s)). No results found.     Medical Problem List and Plan: 1.  L hemiplegia worsening due to 3 strokes in total -has associated spasticity and contracture formation   -patient may  Shower?  -ELOS/Goals:min A 17-20 days 2.  Antithrombotics: -DVT/anticoagulation:  Pharmaceutical: Lovenox  -antiplatelet therapy: DAPT x3 weeks followed by ASA alone. 3. Pain Management: Tylenol as needed 4. Mood: LCSW to follow for evaluation and support  -antipsychotic agents: N/AA 5. Neuropsych: This patient is not fully capable of making decisions on his own behalf. 6. Skin/Wound Care: Routine pressure-relief measures. 7. Fluids/Electrolytes/Nutrition: Monitor intake/output.  Check CMET in am.  8.  History of seizure disorder: Continue Lamictal twice daily 9.  Left spastic hemiparesis: Continue baclofen 5 mg 3 times  daily--> titrated upwards? 10.  History of anxiety disorder: Continue Lexapro.  Hydroxyzine 3 times daily. 11.  Insomnia: Continue melatonin at bedtime. 12.  Hypothyroid: Stable on supplement. 13. Spasticity/forming contractures- last saw Dr Letta Pate in April- likely not due to Botox yet- pt might benefit from ITB pump long term?  Bary Leriche, PA-C 04/14/2021   I have personally performed a face to face diagnostic evaluation of this patient and formulated the key components of the plan.  Additionally, I have personally reviewed laboratory data, imaging studies, as well as relevant notes and concur with the physician assistant's documentation above.   The patient's status has not changed from the original H&P.  Any changes in documentation from the acute care chart have been noted above.

## 2021-04-14 NOTE — Progress Notes (Signed)
Inpatient Rehabilitation Medication Review by a Pharmacist  A complete drug regimen review was completed for this patient to identify any potential clinically significant medication issues.  Clinically significant medication issues were identified:  no  Check AMION for pharmacist assigned to patient if future medication questions/issues arise during this admission.  Pharmacist comments: No issues  Time spent performing this drug regimen review (minutes):  10 minutes   Tad Moore 04/14/2021 2:53 PM

## 2021-04-14 NOTE — TOC Transition Note (Signed)
Transition of Care [facility]) - CM/SW Discharge Note   Patient Details  Name: Howard White MRN: [id number] Date of Birth: Oct 05, 1961  [facility] CM/SW Contact:  Pollie Friar, RN Phone Number: 04/14/2021, 10:14 AM   Clinical Narrative:    Patient discharging to CIR today. CM signing off.   Final next level of care: IP Rehab Facility Barriers to Discharge: No Barriers Identified   Patient Goals and CMS Choice        Discharge Placement                       Discharge Plan and Services                                     Social Determinants of Health (SDOH) Interventions     Readmission Risk Interventions No flowsheet data found.

## 2021-04-14 NOTE — H&P (Signed)
Physical Medicine and Rehabilitation Admission H&P     CC: Stroke with functional deficits.      HPI: Howard White is a 60 year old male with history of left frontal craniotomy s/p astrocytoma resection, seizure disorder, CVA 11/21 with residual left hemiplegia who was admitted on 04/08/2021 with reports of multiple falls, increasing sleepiness with decrease in interaction and progressive weakness.  CT head was negative for acute finding and showed advanced small vessel disease as well as remote left cerebral glioma resection.  MRI brain done showing acute small vessel infarct involving right corona radiata and adjacent basal ganglia.  MRA was negative for occlusion or stenosis.  He has had issues with tremors right upper extremity but no evidence of seizure per neurology.  EEG done showing evidence of epileptogenicity and cortical dysfunction left frontocentral region c/w underlying craniotomy but no seizures seen during recording.  Carotid Dopplers were negative for significant ICA stenosis.  2D echo showed EF of 60 to 65% no wall abnormality.    Dr. Leonie Man felt that stroke was secondary to small vessel disease and recommended DAPT x3 weeks followed by ASA alone. Speech therapy evaluations revealed cognitive motor speech deficits with decreased attention and reported to be worse since stroke.  Therapy ongoing and patient noted to have balance deficits with decrease in motor coordination RUE/RLE, tendency to push to left and posteriorly as well as anxiety, restlessness and poor safety awareness.  At baseline patient was wheelchair bound but was working on standing attempt with outpatient therapy.  Family assist with ADL tasks.Marland Kitchen  He continues to be limited by left hemiplegia with spasticity and CIR was recommended to follow plan.   Pt whispered, but was very hard ot understand; also not following more than 50% of commands, did say the stroke "made his pride hurt".  Was able to tell me he had a  stroke.      Review of Systems  Unable to perform ROS: Medical condition               Past Medical History:  Diagnosis Date  . Brain cancer ([place])    . Seizures ([place])    . Thyroid disease      Hypothyroid               Past Surgical History:  Procedure Laterality Date  . brain cancer      . BRAIN SURGERY                   Family History  Problem Relation Age of Onset  . Brain cancer Other    . Thyroid disease Other    . Cancer Other    . Diabetes Other    . Stroke Other        Social History:  Lives with parents. Per reports that he has never smoked. He has never used smokeless tobacco.  Per reports current alcohol use and that he does not use drugs.             Allergies  Allergen Reactions  . Lactose Intolerance (Gi)            Medications Prior to Admission  Medication Sig Dispense Refill  . acetaminophen (TYLENOL) 325 MG tablet Take 2 tablets (650 mg total) by mouth every 6 (six) hours as needed for mild pain (or Fever >/= 101).      Marland Kitchen aspirin EC 81 MG EC tablet Take 1 tablet (81 mg total) by  mouth daily. Swallow whole. (Patient taking differently: Take 81 mg by mouth every evening. Swallow whole.) 30 tablet 11  . atorvastatin (LIPITOR) 40 MG tablet Take 40 mg by mouth every evening.      . Baclofen 5 MG TABS Take 5-10 mg by mouth as directed. Take 1 tablet (5 mg) in the morning and Take 2 tablets (10 mg) at bedtime      . calcium carbonate (TUMS - DOSED IN MG ELEMENTAL CALCIUM) 500 MG chewable tablet Chew 1 tablet (200 mg of elemental calcium total) by mouth daily. (Patient taking differently: Chew 1 tablet by mouth daily as needed for indigestion.) 30 tablet 0  . docusate sodium (COLACE) 100 MG capsule Take 2 capsules (200 mg total) by mouth 2 (two) times daily. (Patient taking differently: Take 200 mg by mouth 2 (two) times daily as needed for mild constipation.) 10 capsule 0  . escitalopram (LEXAPRO) 10 MG tablet Take 1 tablet (10 mg total) by mouth daily.  30 tablet 0  . famotidine (PEPCID) 20 MG tablet Take 1 tablet (20 mg total) by mouth daily. 30 tablet 0  . famotidine (PEPCID) 20 MG tablet Take 20 mg by mouth daily.      . fluticasone (FLONASE) 50 MCG/ACT nasal spray Place 1 spray into both nostrils 2 (two) times daily as needed for allergies.      Marland Kitchen ketotifen (ZADITOR) 0.025 % ophthalmic solution Place 1 drop into both eyes 2 (two) times daily as needed (for itchy eyes). 5 mL 0  . lamoTRIgine (LAMICTAL) 150 MG tablet TAKE 1 AND 1/2 TABS EVERY MORNING AND 2 TAB EVERY EVENING (Patient taking differently: Take 225-300 mg by mouth See admin instructions. TAKE 1 AND 1/2 TABS ( 225mg )  EVERY MORNING AND 2 TAB (300mg ) EVERY EVENING) 315 tablet 3  . melatonin 3 MG TABS tablet Take 2 tablets (6 mg total) by mouth at bedtime. (Patient taking differently: Take 6 mg by mouth at bedtime as needed (sleep).) 30 tablet 0  . Multiple Vitamins-Minerals (MULTIVITAMIN WITH MINERALS) tablet Take 1 tablet by mouth daily.      . polyethylene glycol (MIRALAX / GLYCOLAX) 17 g packet Take 17 g by mouth daily. (Patient taking differently: Take 17 g by mouth daily as needed for moderate constipation.) 14 each 0  . SYNTHROID 112 MCG tablet Take 1 tablet (112 mcg total) by mouth at bedtime. 30 tablet 0      Drug Regimen Review  Drug regimen was reviewed and remains appropriate with no significant issues identified   Home: Home Living Family/patient expects to be discharged to:: Skilled nursing facility Living Arrangements: Parent Available Help at Discharge: Family,Available 24 hours/day Type of Home: House Home Access: Level entry Home Layout: Two level,Able to live on main level with bedroom/bathroom Bathroom Shower/Tub: Chiropodist: Standard Bathroom Accessibility: Yes Home Equipment: Wheelchair - manual,Cane - single point,Tub bench,Hand held shower head Additional Comments: attends outpatient therapy  Lives With: Family   Functional  History: Prior Function Level of Independence: Needs assistance Gait / Transfers Assistance Needed: Primarily using w/c. Father light assistance for functional mobility in home with SPC. ADL's / Homemaking Assistance Needed: Performs dressing and BADLs; father assists with bathing   Functional Status:  Mobility: Bed Mobility Overal bed mobility: Needs Assistance Bed Mobility: Supine to Sit,Sit to Supine Supine to sit: Mod assist,+2 for physical assistance,+2 for safety/equipment Sit to supine: Max assist,+2 for physical assistance,+2 for safety/equipment General bed mobility comments: Difficulty sequencing and required increased  time and assistance this session. incoordination of R UE and LE noted during transitions. Cues for deep breathing and relaxation to calm patient Transfers Overall transfer level: Needs assistance Equipment used: 1 person hand held assist Transfer via Lift Equipment: Stedy Transfers: Sit to/from [employer] to Stand: +2 physical assistance,+2 safety/equipment,Mod assist Stand pivot transfers: Total assist,+2 physical assistance,+2 safety/equipment General transfer comment: Mod A +2 for power up and then blocking of L knee. As pt became more fatigued in standing, requiring Max A for posterior lean Ambulation/Gait Ambulation/Gait assistance: Mod assist,+2 physical assistance,+2 safety/equipment Gait Distance (Feet): 5 Feet Assistive device: 1 person hand held assist Gait Pattern/deviations: Step-to pattern,Decreased step length - right,Decreased step length - left,Decreased stride length,Decreased weight shift to right,Decreased weight shift to left,Shuffle,Leaning posteriorly,Narrow base of support General Gait Details: deferred due to poor sitting balance Gait velocity: decreased   ADL: ADL Overall ADL's : Needs assistance/impaired Eating/Feeding: Moderate assistance,Sitting Grooming: Moderate assistance,Sitting,Min guard Grooming Details  (indicate cue type and reason): Mod A for bilateral coorindation. Min A for managing wash clothe Upper Body Bathing: Moderate assistance,Sitting Lower Body Bathing: Maximal assistance,+2 for physical assistance,Sit to/from stand Upper Body Dressing : Moderate assistance,Sitting Lower Body Dressing: Maximal assistance,+2 for physical assistance,Sit to/from stand Toilet Transfer: Cueing for sequencing,Minimal assistance,+2 for physical assistance (sit<>stand with stedy) Toilet Transfer Details (indicate cue type and reason): Min A +2 for power up into standing with use of stedy. Continues to persent with LLE extension and LUE flexion patterns Functional mobility during ADLs: Moderate assistance,+2 for physical assistance (sit<>stand) General ADL Comments: Focused session on splint check and sitting balance. Pt presenting with decreased balance, coorindation, strength, and cognition compared to yesterdar's session   Cognition: Cognition Overall Cognitive Status: Impaired/Different from baseline Arousal/Alertness: Awake/alert Orientation Level: Oriented X4 Attention: Sustained Sustained Attention: Impaired Sustained Attention Impairment: Functional basic,Verbal basic Memory:  (difficult to assess) Behaviors: Restless Safety/Judgment: Impaired Cognition Arousal/Alertness: Lethargic,Awake/alert Behavior During Therapy: Restless,Impulsive,Anxious,Flat affect Overall Cognitive Status: Impaired/Different from baseline Area of Impairment: Attention,Memory,Following commands,Safety/judgement,Awareness,Problem solving Orientation Level: Disoriented to,Time Current Attention Level: Focused,Sustained (very short sustained attention; quickly internally distracted) Memory: Decreased short-term memory Following Commands: Follows one step commands with increased time,Follows one step commands inconsistently Safety/Judgement: Decreased awareness of safety,Decreased awareness of deficits Awareness:  Intellectual Problem Solving: Slow processing,Difficulty sequencing,Requires verbal cues,Requires tactile cues General Comments: Decreased attention this session compared to last with frequent cueing and increased time. Patient with increasing coordination deficits especially on R side this session requiring max cues and assistance to safely sit EOB. Pt internally distracted and perseverating on certain phrases such as "chest up chest up" or "head high head high."   Physical Exam: Blood pressure 131/82, pulse 92, temperature 98.1 F (36.7 C), temperature source Oral, resp. rate 19, height 5\' 10"  (1.778 m), weight 85.3 kg, SpO2 99 %. Physical Exam Vitals and nursing note reviewed.  Constitutional:      Comments: Thin male, lying sideways in bed, naked but covered with sheet and condom catheter tubing lying on the floor.   Pt twisted up in blanket, but lying naked in bed otherwise, depends also twisted up- bed was wet underneath his torso, but appeared to be sweat; whispering short phrases to communicate occ, NAD  HENT:     Head: Normocephalic.     Comments: L facial droop- whispering a few phrases; couldn't participate in sticking tongue out.    Right Ear: External ear normal.     Left Ear: External ear normal.  Nose: Nose normal. No congestion.     Mouth/Throat:     Mouth: Mucous membranes are dry.     Pharynx: Oropharynx is clear. No oropharyngeal exudate.  Eyes:     General:        Right eye: No discharge.        Left eye: No discharge.     Comments: Kept closing eyes- not able to check EOMs  Cardiovascular:     Rate and Rhythm: Normal rate and regular rhythm.     Heart sounds: Normal heart sounds. No murmur heard. No gallop.   Pulmonary:     Comments: CTA B/L- no W/R/R- good air movement  Abdominal:     Comments: Had scratch on RUQ- Soft, NT, ND, (+)BS    Genitourinary:    Comments: Condom catheter tubing detached and on floor Musculoskeletal:     Cervical back: Normal  range of motion and neck supple.     Comments: Healing abrasions on left knee/shin.  Posturing greatly with LUE- wrist flexed, bicep flexed, shoulder hiked up; @nd  digit pointing, otherwise, fingers curled constantly moving around in bed- using R side Spontaneous movement on R side appears to be at least 4/5 LUE- no movement to command (also bad tone) LLE- at least 3/5 on HF/DF and PF Couldn't bend/flex L knee at all- kept going into extensor tone.   Skin:    General: Skin is warm and dry.     Comments: Abdominal bruising- from lovenox No skin breakdown other than that, bruising on knee and scratch on RUQ  Neurological:     Mental Status: He is alert.     Comments: RUE and upper body with spastic movements.  Internally distracted.  Soft voice with dysarthria but able to answer simple biographic questions.  LUE flexed with fist at chest (pressure area noted) and LLE with extensor tone. LUE MAS of 4 - flexion spasticity as described as above Extensor tone making impossible to flex L knee-  Only thing he told me was that had a stroke- kept mumbling to himself  Psychiatric:     Comments: Flat        Lab Results Last 48 Hours  No results found for this or any previous visit (from the past 48 hour(s)).    Imaging Results (Last 48 hours)  No results found.            Medical Problem List and Plan: 1.  L hemiplegia worsening due to 3 strokes in total -has associated spasticity and contracture formation              -patient may  Shower?             -ELOS/Goals:min A 17-20 days 2.  Antithrombotics: -DVT/anticoagulation:  Pharmaceutical: Lovenox             -antiplatelet therapy: DAPT x3 weeks followed by ASA alone. 3. Pain Management: Tylenol as needed 4. Mood: LCSW to follow for evaluation and support             -antipsychotic agents: N/AA 5. Neuropsych: This patient is not fully capable of making decisions on his own behalf. 6. Skin/Wound Care: Routine pressure-relief  measures. 7. Fluids/Electrolytes/Nutrition: Monitor intake/output.  Check CMET in am.  8.  History of seizure disorder: Continue Lamictal twice daily 9.  Left spastic hemiparesis: Continue baclofen 5 mg 3 times daily--> titrated upwards? 10.  History of anxiety disorder: Continue Lexapro.  Hydroxyzine 3 times daily. 11.  Insomnia: Continue melatonin  at bedtime. 12.  Hypothyroid: Stable on supplement. 13. Spasticity/forming contractures- last saw Dr Letta Pate in April- likely not due to Botox yet- pt might benefit from ITB pump long term?   Bary Leriche, PA-C 04/14/2021    I have personally performed a face to face diagnostic evaluation of this patient and formulated the key components of the plan.  Additionally, I have personally reviewed laboratory data, imaging studies, as well as relevant notes and concur with the physician assistant's documentation above.   The patient's status has not changed from the original H&P.  Any changes in documentation from the acute care chart have been noted above.

## 2021-04-14 NOTE — Progress Notes (Signed)
Pt had splint on at 1900 to 0000. Pt took it off because it was hurting his hand, he stated.

## 2021-04-14 NOTE — Progress Notes (Signed)
Jamse Arn, MD  Physician  Physical Medicine and Rehabilitation  Consult Note      Signed  Date of Service:  04/10/2021  5:20 AM      Related encounter: ED to Hosp-Admission (Current) from 04/08/2021 in [facility]       Signed      Expand All Collapse All     Show:Clear all [x] Manual[x] Template[] Copied  Added by: [x] Angiulli, Lavon Paganini, PA-C[x] Jamse Arn, MD   [] Hover for details           Physical Medicine and Rehabilitation Consult Reason for Consult: Bilateral lower extremity weakness with falls Referring Physician: Dr.Mikhail   HPI: Howard White is a 60 y.o. 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  History taken from chart review, mother, and patient.  Patient lives with elderly parents.  He used a wheelchair for mobility PTA with assistive device for transfers.  Father helps with some ADLs, but cannot assist with mobility or transfers.  Two-level home bed and bath main level.  He presented on 04/08/2021 with bilateral bilateral lower extremity weakness of acute onset.  CT/MRI showed acute small vessel infarct involving right corona radiata and adjacent basal ganglia.  MRA of head and neck pending.  Echocardiogram with ejection fraction of 60 to 65%, no wall motion abnormalities grade 1 diastolic dysfunction.  EEG pending.  Admission chemistries unremarkable, urinalysis positive nitrite.  Presently maintained on aspirin as well as Plavix for CVA prophylaxis.  Subcutaneous Lovenox for DVT prophylaxis.  Tolerating a regular diet.  Therapy evaluations completed due to patient's decreased functional mobility recommendations of physical medicine rehab consult.   Review of Systems   Constitutional: Positive for malaise/fatigue. Negative for chills and fever.  HENT: Negative for hearing loss.   Eyes: Negative for blurred vision and double vision.  Respiratory: Negative for cough and shortness of breath.   Cardiovascular: Negative for chest pain, palpitations and leg swelling.  Gastrointestinal: Positive for constipation. Negative for heartburn, nausea and vomiting.  Genitourinary: Negative for dysuria and hematuria.  Musculoskeletal: Positive for myalgias.  Skin: Negative for rash.  Neurological: Positive for tremors, seizures and weakness.  Psychiatric/Behavioral: The patient has insomnia.   All other systems reviewed and are negative.       Past Medical History:  Diagnosis Date  . Brain cancer ([place])    . Seizures ([place])    . Thyroid disease      Hypothyroid         Past Surgical History:  Procedure Laterality Date  . brain cancer      . BRAIN SURGERY             Family History  Problem Relation Age of Onset  . Brain cancer Other    . Thyroid disease Other    . Cancer Other    . Diabetes Other    . Stroke Other      Social History:  reports that he has never smoked. He has never used smokeless tobacco. He reports current alcohol use. He reports that he does not use drugs. Allergies:      Allergies  Allergen Reactions  . Lactose Intolerance (Gi)            Medications Prior to Admission  Medication Sig Dispense Refill  . acetaminophen (TYLENOL) 325 MG  tablet Take 2 tablets (650 mg total) by mouth every 6 (six) hours as needed for mild pain (or Fever >/= 101).      Marland Kitchen aspirin EC 81 MG EC tablet Take 1 tablet (81 mg total) by mouth daily. Swallow whole. (Patient taking differently: Take 81 mg by mouth every evening. Swallow whole.) 30 tablet 11  . atorvastatin (LIPITOR) 40 MG tablet Take 40 mg by mouth every evening.      . Baclofen 5 MG TABS Take 5-10 mg by mouth as directed. Take 1 tablet (5 mg) in the morning and Take 2 tablets (10 mg) at bedtime       . calcium carbonate (TUMS - DOSED IN MG ELEMENTAL CALCIUM) 500 MG chewable tablet Chew 1 tablet (200 mg of elemental calcium total) by mouth daily. (Patient taking differently: Chew 1 tablet by mouth daily as needed for indigestion.) 30 tablet 0  . docusate sodium (COLACE) 100 MG capsule Take 2 capsules (200 mg total) by mouth 2 (two) times daily. (Patient taking differently: Take 200 mg by mouth 2 (two) times daily as needed for mild constipation.) 10 capsule 0  . escitalopram (LEXAPRO) 10 MG tablet Take 1 tablet (10 mg total) by mouth daily. 30 tablet 0  . famotidine (PEPCID) 20 MG tablet Take 1 tablet (20 mg total) by mouth daily. 30 tablet 0  . famotidine (PEPCID) 20 MG tablet Take 20 mg by mouth daily.      . fluticasone (FLONASE) 50 MCG/ACT nasal spray Place 1 spray into both nostrils 2 (two) times daily as needed for allergies.      Marland Kitchen ketotifen (ZADITOR) 0.025 % ophthalmic solution Place 1 drop into both eyes 2 (two) times daily as needed (for itchy eyes). 5 mL 0  . lamoTRIgine (LAMICTAL) 150 MG tablet TAKE 1 AND 1/2 TABS EVERY MORNING AND 2 TAB EVERY EVENING (Patient taking differently: Take 225-300 mg by mouth See admin instructions. TAKE 1 AND 1/2 TABS ( 225mg )  EVERY MORNING AND 2 TAB (300mg ) EVERY EVENING) 315 tablet 3  . melatonin 3 MG TABS tablet Take 2 tablets (6 mg total) by mouth at bedtime. (Patient taking differently: Take 6 mg by mouth at bedtime as needed (sleep).) 30 tablet 0  . Multiple Vitamins-Minerals (MULTIVITAMIN WITH MINERALS) tablet Take 1 tablet by mouth daily.      . polyethylene glycol (MIRALAX / GLYCOLAX) 17 g packet Take 17 g by mouth daily. (Patient taking differently: Take 17 g by mouth daily as needed for moderate constipation.) 14 each 0  . SYNTHROID 112 MCG tablet Take 1 tablet (112 mcg total) by mouth at bedtime. 30 tablet 0      Home: Home Living Family/patient expects to be discharged to:: Private residence Living Arrangements: Parent Available Help  at Discharge: Family,Available 24 hours/day Type of Home: House Home Access: Level entry Home Layout: Two level,Able to live on main level with bedroom/bathroom Bathroom Shower/Tub: Chiropodist: Standard Home Equipment: Wheelchair - manual,Cane - single point,Tub bench,Hand held shower head  Functional History: Prior Function Level of Independence: Needs assistance Gait / Transfers Assistance Needed: Primarily using w/c. Father light assistance for functional mobility in home with SPC. ADL's / Homemaking Assistance Needed: Performs dressing and BADLs; father assists with bathing Functional Status:  Mobility: Bed Mobility Overal bed mobility: Needs Assistance Bed Mobility: Supine to Sit Supine to sit: Min assist,+2 for safety/equipment,HOB elevated General bed mobility comments: significant time and effort. Cues for managing L side. Min A for  bringing L hip towards EOB Transfers Overall transfer level: Needs assistance Equipment used: 1 person hand held assist Transfers: Sit to/from Stand Sit to Stand: Min assist,From elevated surface,+2 physical assistance General transfer comment: Min A +2 for power up into standing. Blocking of L knee. Use of RUE on therapist's hand for support   ADL: ADL Overall ADL's : Needs assistance/impaired Eating/Feeding: Moderate assistance,Sitting Grooming: Moderate assistance,Sitting,Min guard Grooming Details (indicate cue type and reason): Mod A for bilateral coorindation. Min A for managing wash clothe Upper Body Bathing: Moderate assistance,Sitting Lower Body Bathing: Maximal assistance,+2 for physical assistance,Sit to/from stand Upper Body Dressing : Moderate assistance,Sitting Lower Body Dressing: Maximal assistance,+2 for physical assistance,Sit to/from stand Toilet Transfer: Moderate assistance,+2 for physical assistance,Ambulation,Cueing for sequencing,Minimal assistance Functional mobility during ADLs: Moderate  assistance,+2 for physical assistance   Cognition: Cognition Overall Cognitive Status: Impaired/Different from baseline Orientation Level: Oriented X4 Cognition Arousal/Alertness: Awake/alert,Lethargic (yawning and sleepy) Behavior During Therapy: WFL for tasks assessed/performed,Impulsive Overall Cognitive Status: Impaired/Different from baseline Area of Impairment: Attention,Memory,Following commands,Safety/judgement,Awareness,Problem solving,Orientation Orientation Level: Disoriented to,Time (stating it is 2020) Current Attention Level: Sustained Memory: Decreased short-term memory Following Commands: Follows one step commands with increased time,Follows one step commands inconsistently Safety/Judgement: Decreased awareness of safety,Decreased awareness of deficits Awareness: Intellectual Problem Solving: Slow processing,Difficulty sequencing,Requires verbal cues General Comments: Very motivated despie fatigue. Pt with decreased attention, problem solving, sequencing, and awareness. Requiring increased time and cues throughout. Once fatigued, pt having decreased coorindaton and requiring Max cues for safety to sit at recliner.   Blood pressure 123/77, pulse 73, temperature 98.3 F (36.8 C), temperature source Oral, resp. rate 16, SpO2 93 %. Physical Exam Vitals reviewed.  Constitutional:      General: He is in acute distress.     Appearance: He is not ill-appearing.  HENT:     Head: Normocephalic and atraumatic.     Right Ear: External ear normal.     Left Ear: External ear normal.     Nose: Nose normal.  Eyes:     General:        Right eye: No discharge.        Left eye: No discharge.     Extraocular Movements: Extraocular movements intact.  Cardiovascular:     Rate and Rhythm: Normal rate and regular rhythm.  Pulmonary:     Effort: Pulmonary effort is normal. No respiratory distress.     Breath sounds: No stridor.  Abdominal:     General: Abdomen is flat. Bowel sounds  are normal. There is no distension.  Musculoskeletal:     Cervical back: Normal range of motion and neck supple.     Comments: No edema or tenderness in extremities  Skin:    General: Skin is warm and dry.  Neurological:     Mental Status: He is alert.     Comments: Alert and Oriented x2.   Fair insight and awareness. Motor exam: limited by persistent significant myoclonus, RUE/RLE appears to be 5/5 LLE: ?4+/5 LUE: ?Shoulder abduction, elbow flex/ext 4-/5, distally 0/5  Psychiatric:        Mood and Affect: Mood normal.        Behavior: Behavior normal.        Lab Results Last 24 Hours        Results for orders placed or performed during the hospital encounter of 04/08/21 (from the past 24 hour(s))  Urine culture     Status: None (Preliminary result)    Collection Time: 04/08/21  1:28  PM    Specimen: Urine, Random  Result Value Ref Range    Specimen Description URINE, RANDOM      Special Requests NONE      Culture          CULTURE REINCUBATED FOR BETTER GROWTH Performed at [facility], [place] [address] [place], [place] [address]      Report Status PENDING    Resp Panel by RT-PCR (Flu A&B, Covid) Nasopharyngeal Swab     Status: None    Collection Time: 04/08/21  1:51 PM    Specimen: Nasopharyngeal Swab; Nasopharyngeal(NP) swabs in vial transport medium  Result Value Ref Range    SARS Coronavirus 2 by RT PCR NEGATIVE NEGATIVE    Influenza A by PCR NEGATIVE NEGATIVE    Influenza B by PCR NEGATIVE NEGATIVE  CBC     Status: None    Collection Time: 04/09/21 12:53 AM  Result Value Ref Range    WBC 8.6 4.0 - 10.5 K/uL    RBC 4.83 4.22 - 5.81 MIL/uL    Hemoglobin 15.3 13.0 - 17.0 g/dL    HCT 44.1 39.0 - 52.0 %    MCV 91.3 80.0 - 100.0 fL    MCH 31.7 26.0 - 34.0 pg    MCHC 34.7 30.0 - 36.0 g/dL    RDW 11.7 11.5 - 15.5 %    Platelets 264 150 - 400 K/uL    nRBC 0.0 0.0 - 0.2 %  Comprehensive metabolic panel     Status: Abnormal    Collection Time: 04/09/21 12:53 AM   Result Value Ref Range    Sodium 134 (L) 135 - 145 mmol/L    Potassium 3.8 3.5 - 5.1 mmol/L    Chloride 102 98 - 111 mmol/L    CO2 24 22 - 32 mmol/L    Glucose, Bld 121 (H) 70 - 99 mg/dL    BUN 13 6 - 20 mg/dL    Creatinine, Ser 1.16 0.61 - 1.24 mg/dL    Calcium 9.1 8.9 - 10.3 mg/dL    Total Protein 5.9 (L) 6.5 - 8.1 g/dL    Albumin 3.7 3.5 - 5.0 g/dL    AST 23 15 - 41 U/L    ALT 21 0 - 44 U/L    Alkaline Phosphatase 70 38 - 126 U/L    Total Bilirubin 0.9 0.3 - 1.2 mg/dL    GFR, Estimated >60 >60 mL/min    Anion gap 8 5 - 15  TSH     Status: None    Collection Time: 04/09/21 12:53 AM  Result Value Ref Range    TSH 4.098 0.350 - 4.500 uIU/mL  Lipid panel     Status: None    Collection Time: 04/09/21 12:53 AM  Result Value Ref Range    Cholesterol 101 0 - 200 mg/dL    Triglycerides 50 <150 mg/dL    HDL 48 >40 mg/dL    Total CHOL/HDL Ratio 2.1 RATIO    VLDL 10 0 - 40 mg/dL    LDL Cholesterol 43 0 - 99 mg/dL       Imaging Results (Last 48 hours)  CT Head Wo Contrast   Result Date: 04/08/2021 CLINICAL DATA:  Weakness.  Falls. EXAM: CT HEAD WITHOUT CONTRAST TECHNIQUE: Contiguous axial images were obtained from the base of the skull through the vertex without intravenous contrast. COMPARISON:  Brain MRI 09/26/2020 FINDINGS: Brain: Cystic density in the left frontal region correlating with remote history of astrocytoma resection.  Confluent chronic small vessel ischemia in the cerebral white matter with perforator infarct at the right basal ganglia on a chronic basis. Suspect accelerated disease from radiotherapy. No acute hemorrhage, hydrocephalus, or worrisome mass effect Vascular: Atheromatous calcification Skull: Diffuse heterogeneity of the skull, likely radiation effects. Unremarkable remote left frontal craniotomy. Sinuses/Orbits: Negative IMPRESSION: 1. No acute finding. 2. Remote left cerebral glioma resection. 3. Advanced small-vessel disease presumably related to prior  radiotherapy. Electronically Signed   By: Monte Fantasia M.D.   On: 04/08/2021 10:50    MR BRAIN WO CONTRAST   Result Date: 04/08/2021 CLINICAL DATA:  Following EXAM: MRI HEAD WITHOUT CONTRAST TECHNIQUE: Multiplanar, multiecho pulse sequences of the brain and surrounding structures were obtained without intravenous contrast. COMPARISON:  09/26/2020 FINDINGS: Motion artifact is present.  SWI was not obtained. Brain: There is restricted diffusion involving the right corona radiata extending from body of caudate toward the lentiform nucleus. This is anterior to a similarly oriented chronic infarct. There is a CSF intensity resection cavity of the left frontal lobe abutting or communicating with the left lateral ventricle. Ventricles and sulci are stable in size and configuration. No mass effect. Confluent areas of T2 hyperintensity in the supratentorial white matter are nonspecific and may reflect chronic microvascular ischemic changes and/or therapy related changes. Vascular: Major vessel flow voids at the skull base are preserved. Skull and upper cervical spine: Normal marrow signal is grossly preserved. Sinuses/Orbits: Paranasal sinuses are aerated. Orbits are unremarkable. Other: Sella is unremarkable.  Mastoid air cells are clear. IMPRESSION: Motion degraded study. Acute small vessel infarct involving right corona radiata and adjacent basal ganglia. Chronic findings detailed above. Electronically Signed   By: Macy Mis M.D.   On: 04/08/2021 12:42    ECHOCARDIOGRAM COMPLETE   Result Date: 04/08/2021    ECHOCARDIOGRAM REPORT   Patient Name:   Howard White Date of Exam: 04/08/2021 Medical Rec #:  [id number]    Height:       70.0 in Accession #:    [id number]   Weight:       188.0 lb Date of Birth:  [birth year]   BSA:          2.033 m Patient Age:    82 years     BP:           137/79 mmHg Patient Gender: M            HR:           74 bpm. Exam Location:  Inpatient Procedure: 2D Echo, Cardiac Doppler and  Color Doppler Indications:    CVA  History:        Patient has prior history of Echocardiogram examinations, most                 recent 09/27/2020. Stroke. Brain cancer.  Sonographer:    Dustin Flock Referring Phys: [id number] Leslee Home  Sonographer Comments: No subcostal window. IMPRESSIONS  1. Left ventricular ejection fraction, by estimation, is 60 to 65%. The left ventricle has normal function. The left ventricle has no regional wall motion abnormalities. Left ventricular diastolic parameters are consistent with Grade I diastolic dysfunction (impaired relaxation).  2. Right ventricular systolic function is normal. The right ventricular size is normal.  3. The mitral valve is normal in structure. Trivial mitral valve regurgitation. No evidence of mitral stenosis.  4. The aortic valve is normal in structure. Aortic valve regurgitation is not visualized. No aortic stenosis is present.  5. The inferior vena  cava is normal in size with greater than 50% respiratory variability, suggesting right atrial pressure of 3 mmHg. FINDINGS  Left Ventricle: Left ventricular ejection fraction, by estimation, is 60 to 65%. The left ventricle has normal function. The left ventricle has no regional wall motion abnormalities. The left ventricular internal cavity size was normal in size. There is  no left ventricular hypertrophy. Left ventricular diastolic parameters are consistent with Grade I diastolic dysfunction (impaired relaxation). Normal left ventricular filling pressure. Right Ventricle: The right ventricular size is normal. No increase in right ventricular wall thickness. Right ventricular systolic function is normal. Left Atrium: Left atrial size was normal in size. Right Atrium: Right atrial size was normal in size. Pericardium: There is no evidence of pericardial effusion. Mitral Valve: The mitral valve is normal in structure. Trivial mitral valve regurgitation. No evidence of mitral valve stenosis. Tricuspid  Valve: The tricuspid valve is normal in structure. Tricuspid valve regurgitation is not demonstrated. No evidence of tricuspid stenosis. Aortic Valve: The aortic valve is normal in structure. Aortic valve regurgitation is not visualized. No aortic stenosis is present. Pulmonic Valve: The pulmonic valve was normal in structure. Pulmonic valve regurgitation is not visualized. No evidence of pulmonic stenosis. Aorta: The aortic root is normal in size and structure. Venous: The inferior vena cava is normal in size with greater than 50% respiratory variability, suggesting right atrial pressure of 3 mmHg. IAS/Shunts: No atrial level shunt detected by color flow Doppler.  LEFT VENTRICLE PLAX 2D LVIDd:         4.60 cm  Diastology LVIDs:         3.00 cm  LV e' medial:    6.85 cm/s LV PW:         1.10 cm  LV E/e' medial:  7.6 LV IVS:        0.90 cm  LV e' lateral:   8.92 cm/s LVOT diam:     2.30 cm  LV E/e' lateral: 5.8 LV SV:         81 LV SV Index:   40 LVOT Area:     4.15 cm  RIGHT VENTRICLE RV Basal diam:  2.90 cm RV S prime:     14.00 cm/s TAPSE (M-mode): 1.9 cm LEFT ATRIUM             Index       RIGHT ATRIUM           Index LA diam:        3.30 cm 1.62 cm/m  RA Area:     14.30 cm LA Vol (A2C):   37.2 ml 18.30 ml/m RA Volume:   33.20 ml  16.33 ml/m LA Vol (A4C):   34.6 ml 17.02 ml/m LA Biplane Vol: 36.3 ml 17.85 ml/m  AORTIC VALVE LVOT Vmax:   102.00 cm/s LVOT Vmean:  62.500 cm/s LVOT VTI:    0.195 m  AORTA Ao Root diam: 3.30 cm MITRAL VALVE MV Area (PHT): 4.89 cm    SHUNTS MV Decel Time: 155 msec    Systemic VTI:  0.20 m MV E velocity: 51.80 cm/s  Systemic Diam: 2.30 cm MV A velocity: 60.40 cm/s MV E/A ratio:  0.86 Mihai Croitoru MD Electronically signed by Sanda Klein MD Signature Date/Time: 04/08/2021/4:28:55 PM    Final      Assessment/Plan: Diagnosis: Right corona radiata and adjacent basal ganglia.   Stroke: Continue secondary stroke prophylaxis and Risk Factor Modification listed below:    Antiplatelet therapy:   Blood  Pressure Management:  Continue current medication with prn's with permisive HTN per primary team Statin Agent:   Right sided hemiparesis: fit for orthosis to prevent contractures (resting hand splint for day, wrist cock up splint at night) PT/OT for mobility, ADL training  Motor recovery: Zoloft Labs independently reviewed.  Records reviewed and summated above.   1. Does the need for close, 24 hr/day medical supervision in concert with the patient's rehab needs make it unreasonable for this patient to be served in a less intensive setting? Yes  2. Co-Morbidities requiring supervision/potential complications: astrocytoma with left frontal craniotomy resection 20 years ago, seizure disorder (Lamictal), hypothyroidism (cont meds, ensure appropriate mood and energy level for therapies), history of CVA with left-sided weakness, spastic bladder.   3. Due to bladder management, bowel management, safety, skin/wound care, disease management, pain management and patient education, does the patient require 24 hr/day rehab nursing? Yes 4. Does the patient require coordinated care of a physician, rehab nurse, therapy disciplines of PT/OT to address physical and functional deficits in the context of the above medical diagnosis(es)? Yes Addressing deficits in the following areas: balance, endurance, locomotion, strength, transferring, bowel/bladder control, bathing, dressing, feeding, grooming, toileting and psychosocial support 5. Can the patient actively participate in an intensive therapy program of at least 3 hrs of therapy per day at least 5 days per week? Potentially 6. The potential for patient to make measurable gains while on inpatient rehab is excellent 7. Anticipated functional outcomes upon discharge from inpatient rehab are supervision and min assist  with PT, supervision and min assist with OT, n/a with SLP. 8. Estimated rehab length of stay to reach the above  functional goals is: 17-20 days. 9. Anticipated discharge destination: Home 10. Overall Rehab/Functional Prognosis: fair   RECOMMENDATIONS: This patient's condition is appropriate for continued rehabilitative care in the following setting: CIR medically stable and able tolerate 3 hours of therapy per day if adequate caregiver support available upon discharge. Patient has agreed to participate in recommended program. Yes Note that insurance prior authorization may be required for reimbursement for recommended care.   Comment: Rehab Admissions Coordinator to follow up.   I have personally performed a face to face diagnostic evaluation, including, but not limited to relevant history and physical exam findings, of this patient and developed relevant assessment and plan.  Additionally, I have reviewed and concur with the physician assistant's documentation above.    Delice Lesch, MD, ABPMR Cathlyn Parsons, PA-C 04/09/2021          Revision History         Routing History              Note Details  Author Jamse Arn, MD File Time 04/10/2021 10:43 AM  Author Type Physician Status Signed  Last Editor Jamse Arn, MD Service Physical Medicine and Rehabilitation

## 2021-04-14 NOTE — Progress Notes (Signed)
Howard Gong, RN  Rehab Admission Coordinator  Physical Medicine and Rehabilitation  PMR Pre-admission      Signed  Date of Service:  04/10/2021  1:41 PM      Related encounter: ED to Hosp-Admission (Current) from 04/08/2021 in [facility]       Signed          Show:Clear all [x] Manual[x] Template[x] Copied  Added by: [x] Howard Gong, RN   [] Hover for details  PMR Admission Coordinator Pre-Admission Assessment   Patient: Howard White is an 60 y.o., male MRN: [id number] DOB: [birth year] Height: 5\' 10"  (177.8 cm) Weight: 85.3 kg                                                                                                                                                  Insurance Information HMO:     PPO: yes     PCP:      IPA:      80/20:      OTHER:  PRIMARY: [employer]      Policy#: [id number]      Subscriber: pt CM Name: Tammy      Phone#: [phone]     Fax#: EPIC access Pre-Cert#: [id number] approved for 7 days   Employer:  Benefits:  Phone #: [phone]     Name: 5/27 Eff. Date: 11/15/2018     Deduct: none      Out of Pocket Max: $2450      Life Max: none  CIR: $325 co pay per day days 1 until 6      SNF: no copay days 1 until 20; $184 co pay per day days 21 until 100 Outpatient: $15 co pay per visit     Co-Pay: visits per medical neccesity Home Health: 80%      Co-Pay: visit per medical neccesity DME: 80%     Co-Pay: 20% Providers: in network  SECONDARY: none        Financial Counselor:       Phone#:    The Engineer, [employer]" for patients in Inpatient Rehabilitation Facilities with attached "Privacy Act [place] Records" was provided and verbally reviewed with: Patient and Family   Emergency Contact Information         Contact Information     Name Relation Home Work Mobile    Hayden Mother [phone]   [phone]       Current Medical History  Patient Admitting Diagnosis:  CVA   History of Present Illness:59 y.o. 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  He presented on 04/08/2021 with bilateral bilateral lower extremity weakness of acute onset.  CT/MRI showed  acute small vessel infarct involving right corona radiata and adjacent basal ganglia.  MRA of head and neck pending.  Echocardiogram with ejection fraction of 60 to 65%, no wall motion abnormalities grade 1 diastolic dysfunction.   Admission chemistries unremarkable, urinalysis positive nitrite.  Presently maintained on aspirin as well as Plavix for CVA prophylaxis.  Subcutaneous Lovenox for DVT prophylaxis.  Tolerating a regular diet.     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 Was place on Ceftriaxone and transitioned to Keflex through 5/20.  EEG showed evidence of eppileptogenicity and cortical dysfunction left fromcentreal region consistent with underlying craniotomy. No seizures were seen throughout recording. Continue Lamictal. Continue Lexapro for depression and anxiety. Continue scheduled atarax and ativan prn for anxiety.    Complete NIHSS TOTAL: 6 Glasgow Coma Scale Score: 14   Past Medical History      Past Medical History:  Diagnosis Date  . Brain cancer ([place])    . Seizures ([place])    . Thyroid disease      Hypothyroid    Family History  family history includes Brain cancer in an other family member; Cancer in an other family member; Diabetes in an other family member; Stroke in an other family member; Thyroid disease in an other family member.   Prior Rehab/Hospitalizations:  Has the patient had prior rehab or hospitalizations prior to admission? Yes CIR 09/2020   Has the patient had major surgery during 100 days prior to  admission? No   Current Medications    Current Facility-Administered Medications:  .  acetaminophen (TYLENOL) tablet 650 mg, 650 mg, Oral, Q6H PRN, Anwar, Shayan S, DO, 650 mg at 04/13/21 1018 .  aspirin EC tablet 81 mg, 81 mg, Oral, Daily, Anwar, Shayan S, DO, 81 mg at 04/13/21 1020 .  atorvastatin (LIPITOR) tablet 40 mg, 40 mg, Oral, Daily, Anwar, Shayan S, DO, 40 mg at 04/13/21 1020 .  baclofen (LIORESAL) tablet 5 mg, 5 mg, Oral, TID, Anwar, Shayan S, DO, 5 mg at 04/13/21 2243 .  baclofen (LIORESAL) tablet 5 mg, 5 mg, Oral, Q12H PRN, Bailey-Modzik, Delila A, NP, 5 mg at 04/10/21 1128 .  calcium carbonate (TUMS - dosed in mg elemental calcium) chewable tablet 200 mg of elemental calcium, 1 tablet, Oral, Daily, Anwar, Shayan S, DO, 200 mg of elemental calcium at 04/13/21 1020 .  clopidogrel (PLAVIX) tablet 75 mg, 75 mg, Oral, Daily, Anwar, Shayan S, DO, 75 mg at 04/13/21 1020 .  docusate sodium (COLACE) capsule 200 mg, 200 mg, Oral, BID, Imagene Sheller S, DO, 200 mg at 04/13/21 2244 .  enoxaparin (LOVENOX) injection 40 mg, 40 mg, Subcutaneous, Daily, Anwar, Shayan S, DO, 40 mg at 04/13/21 1035 .  escitalopram (LEXAPRO) tablet 10 mg, 10 mg, Oral, Daily, Anwar, Shayan S, DO, 10 mg at 04/13/21 1020 .  famotidine (PEPCID) tablet 20 mg, 20 mg, Oral, Daily, Anwar, Shayan S, DO, 20 mg at 04/13/21 1020 .  hydrOXYzine (ATARAX/VISTARIL) tablet 25 mg, 25 mg, Oral, TID, Mikhail, Velta Addison, DO, 25 mg at 04/13/21 2243 .  ketotifen (ZADITOR) 0.025 % ophthalmic solution 1 drop, 1 drop, Both Eyes, BID PRN, Imagene Sheller S, DO, 1 drop at 04/09/21 2238 .  lamoTRIgine (LAMICTAL) tablet 225 mg, 225 mg, Oral, q morning, Anwar, Shayan S, DO, 225 mg at 04/13/21 1021 .  lamoTRIgine (LAMICTAL) tablet 300 mg, 300 mg, Oral, QPM, Anwar, Shayan S, DO, 300 mg at 04/13/21 2244 .  levothyroxine (SYNTHROID) tablet 112 mcg, 112 mcg, Oral, QHS, Anwar, Shayan S, DO,  112 mcg at 04/13/21 2244 .  LORazepam (ATIVAN) tablet 0.5 mg, 0.5  mg, Oral, Q6H PRN, Cristal Ford, DO, 0.5 mg at 04/14/21 0108 .  melatonin tablet 6 mg, 6 mg, Oral, QHS, Anwar, Shayan S, DO, 6 mg at 04/13/21 2244 .  multivitamin with minerals tablet 1 tablet, 1 tablet, Oral, Daily, Anwar, Shayan S, DO, 1 tablet at 04/13/21 1020 .  polyethylene glycol (MIRALAX / GLYCOLAX) packet 17 g, 17 g, Oral, Daily, Imagene Sheller S, DO, 17 g at 04/12/21 4098   Patients Current Diet:     Diet Order                      Diet regular Room service appropriate? Yes; Fluid consistency: Thin  Diet effective now                      Precautions / Restrictions Precautions Precautions: Fall Restrictions Weight Bearing Restrictions: No    Has the patient had 2 or more falls or a fall with injury in the past year?Yes   Prior Activity Level Limited Community (1-2x/wk): attends outpatient therapy; min asisst with parents asisstance; left hemiplegia   Prior Functional Level Prior Function Level of Independence: Needs assistance Gait / Transfers Assistance Needed: Primarily using w/c. Father light assistance for functional mobility in home with SPC. ADL's / Homemaking Assistance Needed: Performs dressing and BADLs; father assists with bathing   Self Care: Did the patient need help bathing, dressing, using the toilet or eating?  Needed some help   Indoor Mobility: Did the patient need assistance with walking from room to room (with or without device)? Needed some help   Stairs: Did the patient need assistance with internal or external stairs (with or without device)? Needed some help   Functional Cognition: Did the patient need help planning regular tasks such as shopping or remembering to take medications? Needed some help   Home Assistive Devices / [place] Devices/Equipment: Gilford Rile (specify type) Home Equipment: Wheelchair - manual,Cane - single point,Tub bench,Hand held shower head   Prior Device Use: Indicate devices/aids used by the  patient prior to current illness, exacerbation or injury? Manual wheelchair and Walker   Current Functional Level Cognition   Arousal/Alertness: Awake/alert Overall Cognitive Status: Impaired/Different from baseline Current Attention Level: Focused,Sustained (very short sustained attention; quickly internally distracted) Orientation Level: Oriented to person,Oriented to place Following Commands: Follows one step commands with increased time,Follows one step commands inconsistently Safety/Judgement: Decreased awareness of safety,Decreased awareness of deficits General Comments: Decreased attention this session compared to last with frequent cueing and increased time. Patient with increasing coordination deficits especially on R side this session requiring max cues and assistance to safely sit EOB. Pt internally distracted and perseverating on certain phrases such as "chest up chest up" or "head high head high." Attention: Sustained Sustained Attention: Impaired Sustained Attention Impairment: Functional basic,Verbal basic Memory:  (difficult to assess) Behaviors: Restless Safety/Judgment: Impaired    Extremity Assessment (includes Sensation/Coordination)   Upper Extremity Assessment: LUE deficits/detail LUE Deficits / Details: Presenting at brunnstrom level 3 with flexion synergy pattern into a indwelled fist. Pt requiring increased cues "for relaxing" and time to PROM into extension at hand, wrist, and elbow. At times able to perform AROM of digits into extension. Noting no redness or edema from splint (donned by RN ~10:30) LUE Coordination: decreased fine motor,decreased gross motor  Lower Extremity Assessment: Defer to PT evaluation LLE Deficits / Details: Brunstrom level 3  with extension synergy. Cues for relaxation and PROM into flexion seated EOB. Difficult to assess MMT due to spasticity     ADLs   Overall ADL's : Needs assistance/impaired Eating/Feeding: Moderate  assistance,Sitting Grooming: Moderate assistance,Sitting,Min guard Grooming Details (indicate cue type and reason): Mod A for bilateral coorindation. Min A for managing wash clothe Upper Body Bathing: Moderate assistance,Sitting Lower Body Bathing: Maximal assistance,+2 for physical assistance,Sit to/from stand Upper Body Dressing : Moderate assistance,Sitting Lower Body Dressing: Maximal assistance,+2 for physical assistance,Sit to/from stand Toilet Transfer: Cueing for sequencing,Minimal assistance,+2 for physical assistance (sit<>stand with stedy) Toilet Transfer Details (indicate cue type and reason): Min A +2 for power up into standing with use of stedy. Continues to persent with LLE extension and LUE flexion patterns Functional mobility during ADLs: Moderate assistance,+2 for physical assistance (sit<>stand) General ADL Comments: Focused session on splint check and sitting balance. Pt presenting with decreased balance, coorindation, strength, and cognition compared to yesterdar's session     Mobility   Overal bed mobility: Needs Assistance Bed Mobility: Supine to Sit,Sit to Supine Supine to sit: Mod assist,+2 for physical assistance,+2 for safety/equipment Sit to supine: Max assist,+2 for physical assistance,+2 for safety/equipment General bed mobility comments: Difficulty sequencing and required increased time and assistance this session. incoordination of R UE and LE noted during transitions. Cues for deep breathing and relaxation to calm patient     Transfers   Overall transfer level: Needs assistance Equipment used: 1 person hand held assist Transfer via Lift Equipment: Stedy Transfers: Sit to/from [employer] to Stand: +2 physical assistance,+2 safety/equipment,Mod assist Stand pivot transfers: Total assist,+2 physical assistance,+2 safety/equipment General transfer comment: Mod A +2 for power up and then blocking of L knee. As pt became more fatigued in  standing, requiring Max A for posterior lean     Ambulation / Gait / Stairs / Wheelchair Mobility   Ambulation/Gait Ambulation/Gait assistance: Mod assist,+2 physical assistance,+2 safety/equipment Gait Distance (Feet): 5 Feet Assistive device: 1 person hand held assist Gait Pattern/deviations: Step-to pattern,Decreased step length - right,Decreased step length - left,Decreased stride length,Decreased weight shift to right,Decreased weight shift to left,Shuffle,Leaning posteriorly,Narrow base of support General Gait Details: deferred due to poor sitting balance Gait velocity: decreased     Posture / Balance Dynamic Sitting Balance Sitting balance - Comments: required maxA to assist maintaining sitting balance but blocking L LE and placing on floor. At times, blocking of R LE to maintain sitting balance. Patient with posterior and L lateral lean requiring multimodal cueing for upright posture, however difficulty following commands. Balance Overall balance assessment: Needs assistance Sitting-balance support: Single extremity supported,Feet supported Sitting balance-Leahy Scale: Poor Sitting balance - Comments: required maxA to assist maintaining sitting balance but blocking L LE and placing on floor. At times, blocking of R LE to maintain sitting balance. Patient with posterior and L lateral lean requiring multimodal cueing for upright posture, however difficulty following commands. Postural control: Posterior lean,Left lateral lean Standing balance support: Single extremity supported Standing balance-Leahy Scale: Zero Standing balance comment: totalA to maintain standing EOB     Special needs/care consideration Very anxious at baseline    Previous Home Environment  Living Arrangements: Parent  Lives With: Family Available Help at Discharge: Family,Available 24 hours/day Type of Home: House Home Layout: Two level,Able to live on main level with bedroom/bathroom Home Access: Level  entry Bathroom Shower/Tub: Tub/shower unit Bathroom Toilet: Standard Bathroom Accessibility: Yes Home Care Services: No Additional Comments: attends outpatient therapy   Discharge Living Setting  Plans for Discharge Living Setting: Lives with (comment) (parents) Type of Home at Discharge: House Discharge Home Layout: Two level,Able to live on main level with bedroom/bathroom Discharge Home Access: Level entry Discharge Bathroom Shower/Tub: Tub/shower unit Discharge Bathroom Toilet: Standard Discharge Bathroom Accessibility: Yes How Accessible: Accessible via walker Does the patient have any problems obtaining your medications?: No   Social/Family/Support Systems Contact Information: Mom, Electrical engineer Anticipated Caregiver: parents Anticipated Caregiver's Contact Information: see above Ability/Limitations of Caregiver: no limitations Caregiver Availability: 24/7 Discharge Plan Discussed with Primary Caregiver: Yes Is Caregiver In Agreement with Plan?: Yes Does Caregiver/Family have Issues with Lodging/Transportation while Pt is in Rehab?: No   Goals Patient/Family Goal for Rehab: min PT  and OT, supervision SLP Expected length of stay: ELOS 17 to 20 days Pt/Family Agrees to Admission and willing to participate: Yes Program Orientation Provided & Reviewed with Pt/Caregiver Including Roles  & Responsibilities: Yes   Decrease burden of Care through IP rehab admission: n/a   Possible need for SNF placement upon discharge:not anticipated   Patient Condition: This patient's medical and functional status has changed since the consult dated: 04/10/2021 in which the Rehabilitation Physician determined and documented that the patient's condition is appropriate for intensive rehabilitative care in an inpatient rehabilitation facility. See "History of Present Illness" (above) for medical update. Functional changes are: overall mod asisst. Patient's medical and functional status update has been  discussed with the Rehabilitation physician and patient remains appropriate for inpatient rehabilitation. Will admit to inpatient rehab today.   Preadmission Screen Completed By:  Cleatrice Burke, RN, 04/14/2021 10:02 AM ______________________________________________________________________   Discussed status with Dr. Dagoberto Ligas on 04/14/2021 at  1002 and received approval for admission today.   Admission Coordinator:  Cleatrice Burke, time 5329 Date 04/14/2021             Cosigned by: Courtney Heys, MD at 04/14/2021 10:19 AM    Revision History                                       Note Details  Author Howard Gong, RN File Time 04/14/2021 10:03 AM  Author Type Rehab Admission Coordinator Status Signed  Last Editor Howard Gong, RN Service Physical Medicine and Rehabilitation

## 2021-04-14 NOTE — Plan of Care (Signed)

## 2021-04-15 DIAGNOSIS — I639 Cerebral infarction, unspecified: Secondary | ICD-10-CM

## 2021-04-15 LAB — COMPREHENSIVE METABOLIC PANEL
ALT: 84 U/L — ABNORMAL HIGH (ref 0–44)
AST: 205 U/L — ABNORMAL HIGH (ref 15–41)
Albumin: 4.8 g/dL (ref 3.5–5.0)
Alkaline Phosphatase: 78 U/L (ref 38–126)
Anion gap: 18 — ABNORMAL HIGH (ref 5–15)
BUN: 42 mg/dL — ABNORMAL HIGH (ref 6–20)
CO2: 22 mmol/L (ref 22–32)
Calcium: 10.3 mg/dL (ref 8.9–10.3)
Chloride: 99 mmol/L (ref 98–111)
Creatinine, Ser: 1.47 mg/dL — ABNORMAL HIGH (ref 0.61–1.24)
GFR, Estimated: 55 mL/min — ABNORMAL LOW (ref >60)
Glucose, Bld: 113 mg/dL — ABNORMAL HIGH (ref 70–99)
Potassium: 4.1 mmol/L (ref 3.5–5.1)
Sodium: 139 mmol/L (ref 135–145)
Total Bilirubin: 1.4 mg/dL — ABNORMAL HIGH (ref 0.3–1.2)
Total Protein: 7.2 g/dL (ref 6.5–8.1)

## 2021-04-15 LAB — CBC WITH DIFFERENTIAL/PLATELET
Abs Immature Granulocytes: 0.06 10*3/uL (ref 0.00–0.07)
Basophils Absolute: 0.1 10*3/uL (ref 0.0–0.1)
Basophils Relative: 0 %
Eosinophils Absolute: 0.1 10*3/uL (ref 0.0–0.5)
Eosinophils Relative: 0 %
HCT: 51.1 % (ref 39.0–52.0)
Hemoglobin: 18.1 g/dL — ABNORMAL HIGH (ref 13.0–17.0)
Immature Granulocytes: 0 %
Lymphocytes Relative: 10 %
Lymphs Abs: 1.7 10*3/uL (ref 0.7–4.0)
MCH: 31.8 pg (ref 26.0–34.0)
MCHC: 35.4 g/dL (ref 30.0–36.0)
MCV: 89.6 fL (ref 80.0–100.0)
Monocytes Absolute: 2 10*3/uL — ABNORMAL HIGH (ref 0.1–1.0)
Monocytes Relative: 12 %
Neutro Abs: 12.7 10*3/uL — ABNORMAL HIGH (ref 1.7–7.7)
Neutrophils Relative %: 78 %
Platelets: 365 10*3/uL (ref 150–400)
RBC: 5.7 MIL/uL (ref 4.22–5.81)
RDW: 12 % (ref 11.5–15.5)
WBC: 16.6 10*3/uL — ABNORMAL HIGH (ref 4.0–10.5)
nRBC: 0 % (ref 0.0–0.2)

## 2021-04-15 LAB — URINALYSIS, ROUTINE W REFLEX MICROSCOPIC
Bilirubin Urine: NEGATIVE
Glucose, UA: NEGATIVE mg/dL
Hgb urine dipstick: NEGATIVE
Ketones, ur: 80 mg/dL — AB
Leukocytes,Ua: NEGATIVE
Nitrite: NEGATIVE
Protein, ur: 100 mg/dL — AB
Specific Gravity, Urine: 1.035 — ABNORMAL HIGH (ref 1.005–1.030)
pH: 5 (ref 5.0–8.0)

## 2021-04-15 MED ORDER — SODIUM CHLORIDE 0.45 % IV SOLN: INTRAVENOUS | Status: DC

## 2021-04-15 MED ORDER — AMANTADINE HCL 100 MG PO CAPS
100.0000 mg | ORAL_CAPSULE | Freq: Two times a day (BID) | ORAL | Status: DC
  Administered 2021-04-15: 100 mg via ORAL
  Filled 2021-04-15 (×3): qty 1

## 2021-04-15 NOTE — Patient Care Conference (Signed)
Inpatient RehabilitationTeam Conference and Plan of Care Update Date: 04/15/2021   Time: 10:34 AM    Patient Name: Howard White      Medical Record Number: [id number]  Date of Birth: 07/15/1961 Sex: Male         Room/Bed: 4W17C/4W17C-01 Payor Info: Payor: [employer] / Plan: [employer] PPO / Product Type: *No Product type* /    Admit Date/Time:  04/14/2021  2:42 PM  Primary Diagnosis:  Acute stroke of basal ganglia [facility])  Hospital Problems: Principal Problem:   Acute stroke of basal ganglia [facility])    Expected Discharge Date: Expected Discharge Date:  (Evals pending)  Team Members Present: Physician leading conference: Dr. Alysia Penna Care Coodinator Present: Dorien Chihuahua, RN, BSN, CRRN;Christina Perrinton, BSW Nurse Present: Dorien Chihuahua, RN PT Present: Estevan Ryder, PT OT Present: Clyda Greener, OT SLP Present: Junius Argyle, SLP PPS Coordinator present : Gunnar Fusi, SLP     Current Status/Progress Goal Weekly Team Focus  Bowel/Bladder             Swallow/Nutrition/ Hydration             ADL's             Mobility   eval in progress         Communication             Safety/Cognition/ Behavioral Observations            Pain             Skin               Discharge Planning:      Team Discussion: More solemnent this admission with negative functional change with this stroke. MD to add neuro-stimulant. Note severe tone left elbow and extensor tone left LE and right upper extremity restlessness.  MD to assess need for Botox. Patient pushes left with head flexed, and truncal extension tone, requires max assist for dynamic sitting balance. Recommend bed pan for toileting at present.  Patient on target to meet rehab goals: All evals not yet completed. Mod assist goals set for discharge.  *See Care Plan and progress notes for long and short-term goals.   Revisions to Treatment Plan:   Teaching Needs: Transfers, toileting,  medication management, etc.   Current Barriers to Discharge: Decreased caregiver support, Home enviroment access/layout and Incontinence  Possible Resolutions to Barriers: Family education     Medical Summary Current Status: recurrent RIght BG and CR infarcts, somnolence, increased tone  Barriers to Discharge: Medical stability   Possible Resolutions to [employer] Focus: neurostimulant trial, IV fluids, spasticity managment   Continued Need for Acute Rehabilitation Level of Care: The patient requires daily medical management by a physician with specialized training in physical medicine and rehabilitation for the following reasons: Direction of a multidisciplinary physical rehabilitation program to maximize functional independence : Yes Medical management of patient stability for increased activity during participation in an intensive rehabilitation regime.: Yes Analysis of laboratory values and/or radiology reports with any subsequent need for medication adjustment and/or medical intervention. : Yes   I attest that I was present, lead the team conference, and concur with the assessment and plan of the team.   Dorien Chihuahua B 04/15/2021, 2:49 PM

## 2021-04-15 NOTE — Progress Notes (Signed)
Inpatient Rehabilitation Care Coordinator Assessment and Plan Patient Details  Name: Howard White MRN: [id number] Date of Birth: 09-Oct-1961  Today's Date: 04/15/2021  Hospital Problems: Principal Problem:   Acute stroke of basal ganglia [facility])  Past Medical History:  Past Medical History:  Diagnosis Date  . Brain cancer ([place])   . Seizures ([place])   . Thyroid disease    Hypothyroid   Past Surgical History:  Past Surgical History:  Procedure Laterality Date  . brain cancer    . BRAIN SURGERY     Social History:  reports that he has never smoked. He has never used smokeless tobacco. He reports current alcohol use. He reports that he does not use drugs.  Family / Support Systems Marital Status: Single Other Supports: Electrical engineer (Mother) Anticipated Caregiver: Parents Ability/Limitations of Caregiver: n/a Caregiver Availability: 24/7 Family Dynamics: Family support by parents  Social History Preferred language: English Religion: Protestant Read: Yes Write: Yes Employment Status: Unemployed Public relations account executive Issues: n/a Guardian/Conservator: Gwynneth Albright   Abuse/Neglect Abuse/Neglect Assessment Can Be Completed: Yes Physical Abuse: Denies Verbal Abuse: Denies Sexual Abuse: Denies Exploitation of patient/patient's resources: Denies Self-Neglect: Denies  Emotional Status Recent Psychosocial Issues: n/a Psychiatric History: n/a Substance Abuse History: n/a  Patient / Family Perceptions, Expectations & Goals Pt/Family understanding of illness & functional limitations: yes Premorbid pt/family roles/activities: Attending OP in Community Anticipated changes in roles/activities/participation: Still requring some assistance Pt/family expectations/goals: Min a  Recruitment consultant: None Premorbid Home Care/DME Agencies: Other (Comment) Administrator, [employer], [employer], Tub bench,) Transportation available at discharge: family able to transport Resource referrals  recommended: Neuropsychology  Discharge Planning Living Arrangements: Parent Support Systems: Parent Type of Residence: Private residence (2 level home, on main level, entry level) Administrator, sports: Multimedia programmer (specify) (HTA) Financial Resources: Family Support,SSD Financial Screen Referred: No Living Expenses: Lives with family Money Management: Patient,Family Does the patient have any problems obtaining your medications?: No Home Management: Parent assisting with meds previously Patient/Family Preliminary Plans: able to cont. Care Coordinator Barriers to Discharge: Decreased caregiver support,Lack of/limited family support,Insurance for SNF coverage,Incontinence Care Coordinator Anticipated Follow Up Needs: HH/OP Expected length of stay: 17-20 Days  Clinical Impression sw met with pt and mother in room. Previous patient, re-introduced self. No questions or concerns currently. Pt mother would like pt to d/c home with her and her husband to provide care  [name] 04/15/2021, 1:18 PM

## 2021-04-15 NOTE — Plan of Care (Signed)
  Problem: RH Balance Goal: LTG Patient will maintain dynamic sitting balance (PT) Description: LTG:  Patient will maintain dynamic sitting balance with assistance during mobility activities (PT) Flowsheets (Taken 04/15/2021 1416) LTG: Pt will maintain dynamic sitting balance during mobility activities with:: Moderate Assistance - Patient 50 - 74% Goal: LTG Patient will maintain dynamic standing balance (PT) Description: LTG:  Patient will maintain dynamic standing balance with assistance during mobility activities (PT) Flowsheets (Taken 04/15/2021 1416) LTG: Pt will maintain dynamic standing balance during mobility activities with:: Maximal Assistance - Patient 25 - 49%   Problem: Sit to Stand Goal: LTG:  Patient will perform sit to stand with assistance level (PT) Description: LTG:  Patient will perform sit to stand with assistance level (PT) Flowsheets (Taken 04/15/2021 1416) LTG: PT will perform sit to stand in preparation for functional mobility with assistance level: Moderate Assistance - Patient 50 - 74%   Problem: RH Bed Mobility Goal: LTG Patient will perform bed mobility with assist (PT) Description: LTG: Patient will perform bed mobility with assistance, with/without cues (PT). Flowsheets (Taken 04/15/2021 1416) LTG: Pt will perform bed mobility with assistance level of: Minimal Assistance - Patient > 75%   Problem: RH Bed to Chair Transfers Goal: LTG Patient will perform bed/chair transfers w/assist (PT) Description: LTG: Patient will perform bed to chair transfers with assistance (PT). Flowsheets (Taken 04/15/2021 1416) LTG: Pt will perform Bed to Chair Transfers with assistance level: Moderate Assistance - Patient 50 - 74%   Problem: RH Car Transfers Goal: LTG Patient will perform car transfers with assist (PT) Description: LTG: Patient will perform car transfers with assistance (PT). Flowsheets (Taken 04/15/2021 1416) LTG: Pt will perform car transfers with assist:: Moderate  Assistance - Patient 50 - 74%   Problem: RH Ambulation Goal: LTG Patient will ambulate in home environment (PT) Description: LTG: Patient will ambulate in home environment, # of feet with assistance (PT). Flowsheets (Taken 04/15/2021 1416) LTG: Pt will ambulate in home environ  assist needed:: Maximal Assistance - Patient 25 - 49% LTG: Ambulation distance in home environment: 10   Problem: RH Wheelchair Mobility Goal: LTG Patient will propel w/c in controlled environment (PT) Description: LTG: Patient will propel wheelchair in controlled environment, # of feet with assist (PT) Flowsheets (Taken 04/15/2021 1416) LTG: Pt will propel w/c in controlled environ  assist needed:: Moderate Assistance - Patient 50 - 74% LTG: Propel w/c distance in controlled environment: 25 Goal: LTG Patient will propel w/c in home environment (PT) Description: LTG: Patient will propel wheelchair in home environment, # of feet with assistance (PT). Flowsheets (Taken 04/15/2021 1416) LTG: Pt will propel w/c in home environ  assist needed:: Moderate Assistance - Patient 50 - 74% LTG: Propel w/c distance in home environment: 25

## 2021-04-15 NOTE — Progress Notes (Signed)
Patient ID: Howard White, male   DOB: 01-Aug-1961, 60 y.o.   MRN: [id number] Team Conference Report to Patient/Family  Team Conference discussion was reviewed with the patient and caregiver, including goals, any changes in plan of care and target discharge date.  Patient and caregiver express understanding and are in agreement.  The patient has a target discharge date of  (Evals pending).  [name] 04/15/2021, 1:20 PM

## 2021-04-15 NOTE — Progress Notes (Signed)
Inpatient Rehabilitation  Patient information reviewed and entered into eRehab system by Taheera Thomann M. Christon Parada, M.A., CCC/SLP, PPS Coordinator.  Information including medical coding, functional ability and quality indicators will be reviewed and updated through discharge.    

## 2021-04-15 NOTE — Progress Notes (Signed)
Inpatient [facility] Individual Statement of Services  Patient Name:  Howard White  Date:  04/15/2021  Welcome to the [facility].  Our goal is to provide you with an individualized program based on your diagnosis and situation, designed to meet your specific needs.  With this comprehensive rehabilitation program, you will be expected to participate in at least 3 hours of rehabilitation therapies Monday-Friday, with modified therapy programming on the weekends.  Your rehabilitation program will include the following services:  Physical Therapy (PT), Occupational Therapy (OT), Speech Therapy (ST), 24 hour per day rehabilitation nursing, Therapeutic Recreaction (TR), Neuropsychology, Care Coordinator, Rehabilitation Medicine, Nutrition Services, Pharmacy Services and Other  Weekly team conferences will be held on Wednesdays to discuss your progress.  Your Inpatient Rehabilitation Care Coordinator will talk with you frequently to get your input and to update you on team discussions.  Team conferences with you and your family in attendance may also be held.  Expected length of stay: 17-20 Days  Overall anticipated outcome: Min A  Depending on your progress and recovery, your program may change. Your Inpatient Rehabilitation Care Coordinator will coordinate services and will keep you informed of any changes. Your Inpatient Rehabilitation Care Coordinator's name and contact numbers are listed  below.  The following services may also be recommended but are not provided by the [facility]:    [place] will be made to provide these services after discharge if needed.  Arrangements include referral to agencies that provide these services.  Your insurance has been verified to be:  HTA Your primary doctor is:  Seward Carol, MD  Pertinent information will be shared with  your doctor and your insurance company.  Inpatient Rehabilitation Care Coordinator:  Erlene Quan, [place] or [phone]  Information discussed with and copy given to patient by: [name], 04/15/2021, 11:31 AM

## 2021-04-15 NOTE — Evaluation (Signed)
Occupational Therapy Assessment and Plan  Patient Details  Name: Howard White MRN: [id number] Date of Birth: [birth year]  OT Diagnosis: abnormal posture, altered mental status, cognitive deficits, hemiplegia affecting non-dominant side, muscular wasting and disuse atrophy and muscle weakness (generalized) Rehab Potential: Rehab Potential (ACUTE ONLY): Fair ELOS: 3.4-4 weeks   Today's Date: 04/15/2021 OT Individual Time: 2585-2778 OT Individual Time Calculation (min): 57 min     Hospital Problem: Principal Problem:   Acute stroke of basal ganglia ([place])   Past Medical History:  Past Medical History:  Diagnosis Date  . Brain cancer ([place])   . Seizures ([name])   . Thyroid disease    Hypothyroid   Past Surgical History:  Past Surgical History:  Procedure Laterality Date  . brain cancer    . BRAIN SURGERY      Assessment & Plan Clinical Impression: Patient is a 60 y.o. year old male with recent admission to the hospital on05/25/2022 with reports of multiple falls,increasing sleepiness with decrease in interaction and progressive weakness. CT head was negative for acute finding and showed advanced small vessel disease as well as remote left cerebral glioma resection. MRI brain done showing acute small vessel infarct involving right corona radiata and adjacent basal ganglia. MRA was negative for occlusion or stenosis. He has had issues with tremors right upper extremity but no evidence of seizure per neurology. EEG done showing evidence of epileptogenicity and cortical dysfunction left frontocentral region c/w underlying craniotomy but no seizures seen during recording.  Patient transferred to CIR on 04/14/2021 .    Patient currently requires total with basic self-care skills secondary to muscle weakness, muscle joint tightness and muscle paralysis, impaired timing and sequencing, abnormal tone, unbalanced muscle activation, motor apraxia, decreased coordination and decreased motor  planning, decreased attention to left, decreased attention to right and decreased motor planning, decreased initiation, decreased attention, decreased awareness, decreased problem solving, decreased safety awareness, decreased memory and delayed processing and decreased sitting balance, decreased standing balance, decreased postural control, hemiplegia and decreased balance strategies.  Prior to hospitalization, patient could complete ADLs with min.  Patient will benefit from skilled intervention to decrease level of assist with basic self-care skills and increase independence with basic self-care skills prior to discharge home with care partner.  Anticipate patient will require moderate physical assestance and follow up home health.  OT - End of Session Activity Tolerance: Decreased this session Endurance Deficit: Yes OT Assessment Rehab Potential (ACUTE ONLY): Fair OT Barriers to Discharge: Decreased caregiver support OT Barriers to Discharge Comments: Pt will likely need mod to max assist at discharge. OT Patient demonstrates impairments in the following area(s): Balance;Cognition;Pain;Perception;Motor;Sensory;Skin Integrity;Safety;Other (Comment) (tone/spasticity) OT Basic ADL's Functional Problem(s): Grooming;Bathing;Dressing;Toileting;Eating OT Transfers Functional Problem(s): Toilet;Tub/Shower OT Additional Impairment(s): Fuctional Use of Upper Extremity OT Plan OT Intensity: Minimum of 1-2 x/day, 45 to 90 minutes OT Frequency: 5 out of 7 days OT Duration/Estimated Length of Stay: 3.4-4 weeks OT Treatment/Interventions: Balance/vestibular training;Cognitive remediation/compensation;DME/adaptive equipment instruction;Disease mangement/prevention;Discharge planning;Functional electrical stimulation;Pain management;Self Care/advanced ADL retraining;Therapeutic Activities;UE/LE Coordination activities;Patient/family education;Functional mobility training;Skin care/wound managment;Therapeutic  Exercise;Visual/perceptual remediation/compensation;Neuromuscular re-education;Psychosocial support;Splinting/orthotics;UE/LE Strength taining/ROM;Wheelchair propulsion/positioning OT Self Feeding Anticipated Outcome(s): Supervision OT Basic Self-Care Anticipated Outcome(s): Mod to max assist OT Toileting Anticipated Outcome(s): max assist OT Bathroom Transfers Anticipated Outcome(s): mod assist OT Recommendation Patient destination: Home Follow Up Recommendations: 24 hour supervision/assistance;Home health OT Equipment Recommended: To be determined   OT Evaluation Precautions/Restrictions  Precautions Precautions: Fall Precaution Comments: severe L hemi tone, R hemi dyskinesia (?), Pushing L, aphasia Restrictions  Weight Bearing Restrictions: No General   Vital Signs   Pain Pain Assessment Pain Scale: Faces Faces Pain Scale: No hurt Home Living/Prior Functioning Home Living Family/patient expects to be discharged to:: Private residence Living Arrangements: Parent Available Help at Discharge: Family,Available 24 hours/day Type of Home: House Home Access: Level entry Home Layout: Two level,Able to live on main level with bedroom/bathroom Bathroom Shower/Tub: [employer] social research officer Accessibility: Yes Additional Comments: attends outpatient therapy  Lives With: Family IADL History Homemaking Responsibilities: No Current License: No Occupation: On disability Prior Function Level of Independence: Needs assistance with ADLs,Needs assistance with tranfers,Needs assistance with homemaking,Needs assistance with gait Bath: Minimal Toileting: Minimal Dressing: Minimal Grooming: Minimal  Able to Take Stairs?: No Driving: No Vision Baseline Vision/History: No visual deficits Wears Glasses: At all times Patient Visual Report: No change from baseline Vision Assessment?: Vision impaired- to be further tested in functional context Additional Comments:  difficult to assess Perception  Perception: Impaired Inattention/Neglect: Impaired-to be further tested in functional context Spatial Orientation: poor general body spatial awareness Praxis Praxis: Impaired Praxis Impairment Details: Motor planning;Perseveration;Initiation Cognition Overall Cognitive Status: Impaired/Different from baseline Arousal/Alertness: Awake/alert Orientation Level: Person;Place;Situation Person: Oriented Place: Oriented Situation: Oriented Year: 2022 Month:  (unable to understand secondary to low speech volume and dysarthria) Day of Week:  (unable to understand secondary to low speech volume and dysarthria) Memory: Impaired Immediate Memory Recall: Sock;Blue;Bed Memory Recall Sock: Not able to recall Memory Recall Blue: Not able to recall Attention: Focused;Sustained Focused Attention: Appears intact Sustained Attention: Impaired Sustained Attention Impairment: Functional basic;Verbal basic Awareness: Impaired Awareness Impairment: Intellectual impairment Problem Solving: Impaired Problem Solving Impairment: Verbal basic;Functional basic Behaviors: Restless Safety/Judgment: Impaired Comments: Pt unable to remain still when laying in the bed, writhing around and constantly pushing with pulling with the RUE.  Will conitinue to assess cognition but overall he was able to follow approximately 50-60% of instructional commands. Sensation Sensation Light Touch: Impaired by gross assessment Hot/Cold: Not tested Proprioception: Impaired by gross assessment Stereognosis: Impaired by gross assessment Additional Comments: Difficult to accurately assess secondary to cognition Coordination Gross Motor Movements are Fluid and Coordinated: No Fine Motor Movements are Fluid and Coordinated: No Coordination and Movement Description: Pt unable to functionally use the LUE with severe tone in the elbow, wrist, and digit flexors. Heel Shin Test: unable to follow  commands + L LE hypertonicity + R LE dyskinesia Motor  Motor Motor: Abnormal tone;Hemiplegia Motor - Skilled Clinical Observations: Severe L hemibody hypertonicity, R hemi body dyskinesia, truncal and cervical hypertonicity present, L Pusher  Trunk/Postural Assessment  Cervical Assessment Cervical Assessment: Exceptions to [facility] (cervical flexion with left rotation and tilt at rest.  Decreased AROM/PROM extension and likely rotation to the right, but not assessed this session.) Thoracic Assessment Thoracic Assessment: Exceptions to [facility] (thoracic kyphosis with rotation to the left) Lumbar Assessment Lumbar Assessment: Exceptions to [facility] (posterior pelvic tilt) Postural Control Postural Control: Deficits on evaluation Head Control: maintains veyr forward head Trunk Control: Pushes L, maintains L lateral trunk lean sitting and supine Righting Reactions: delayed and inadequate Protective Responses: delayed with LOB posteriorly  Balance Balance Balance Assessed: Yes Static Sitting Balance Static Sitting - Balance Support: Feet supported Static Sitting - Level of Assistance: 2: Max assist Dynamic Sitting Balance Dynamic Sitting - Balance Support: Feet supported Dynamic Sitting - Level of Assistance: 1: +1 Total assist Static Standing Balance Static Standing - Balance Support: During functional activity Static Standing - Level of Assistance: 1: +2 Total assist  Extremity/Trunk Assessment RUE Assessment RUE Assessment: Not tested Active Range of Motion (AROM) Comments: WFLs noted during use General Strength Comments: Pt actively moving the RUE spontaneously for selfcare tasks and balance.  Noted slight tremor as well distally, but not formally assessed. LUE Assessment LUE Assessment: Exceptions to [facility] Passive Range of Motion (PROM) Comments: Limited at all joints secondary to increased tone Active Range of Motion (AROM) Comments: None noted General Strength Comments: Pt with Brunnstrum  stage I movement actively in the arm and hand.  See below for details on tone. LUE Body System: Neuro LUE Tone LUE Tone: Severe LUE Tone Comments: Pt with severe tone in the elbow, wrist, and digt/thumb flexors.  Pt in fisted positon with thumb inside of hand and fingers completely closed.  Max assist needed to open hand for completion of hygiene and for placement of resting hand splint.  Elbow maintains flexed position as well with therapist being able to range it out to approximately 20 degrees of full into extension with pt shaking his head "yes" when asked if it hurt to straighten it out.  Care Tool Care Tool Self Care Eating   Eating Assist Level: Maximal Assistance - Patient 25 - 49%    Oral Care    Oral Care Assist Level: Moderate Assistance - Patient 50 - 74%    Bathing   Body parts bathed by patient: Chest;Abdomen;Face;Front perineal area Body parts bathed by helper: Right arm;Left arm;Buttocks;Right upper leg;Left upper leg;Face;Left lower leg;Right lower leg   Assist Level: 2 Helpers    Upper Body Dressing(including orthotics)   What is the patient wearing?: Pull over shirt   Assist Level: Maximal Assistance - Patient 25 - 49%    Lower Body Dressing (excluding footwear)   What is the patient wearing?: Incontinence brief;Pants Assist for lower body dressing: 2 Helpers    Putting on/Taking off footwear   What is the patient wearing?: Non-skid slipper socks Assist for footwear: Dependent - Patient 0%       Care Tool Toileting Toileting activity   Assist for toileting: Total Assistance - Patient < 25% (sidelying position)     Care Tool Bed Mobility Roll left and right activity   Roll left and right assist level: Maximal Assistance - Patient 25 - 49%    Sit to lying activity   Sit to lying assist level: Total Assistance - Patient < 25%    Lying to sitting edge of bed activity   Lying to sitting edge of bed assist level: Total Assistance - Patient < 25%     Care  Tool Transfers Sit to stand transfer Sit to stand activity did not occur: Safety/medical concerns Sit to stand assist level: 2 Helpers    Chair/bed transfer Chair/bed transfer activity did not occur: Safety/medical concerns       Toilet transfer Toilet transfer activity did not occur: Safety/medical concerns       Care Tool Cognition Expression of Ideas and Wants Expression of Ideas and Wants: Rarely/Never expressess or very difficult - rarely/never expresses self or speech is very difficult to understand   Understanding Verbal and Non-Verbal Content Understanding Verbal and Non-Verbal Content: Sometimes understands - understands only basic conversations or simple, direct phrases. Frequently requires cues to understand   Memory/Recall Ability *first 3 days only      Refer to Care Plan for Long Term Goals  SHORT TERM GOAL WEEK 1 OT Short Term Goal 1 (Week 1): Pt will complete static sitting balance EOB with min  assist in preparation for selfcare tasks. OT Short Term Goal 2 (Week 1): Pt will complete rolling in the bed with max assist to the right and mod assist to the left to assist with toileting tasks and LB clothing management. OT Short Term Goal 3 (Week 1): Pt will tolerate hand splint for positioning of the LUE into slight extension. OT Short Term Goal 4 (Week 1): Pt will complete UB bathing sitting EOB with no more than mod assist and mod instructional cueing for  Recommendations for other services: None    Skilled Therapeutic Intervention ADL ADL Eating: Maximal assistance Where Assessed-Eating: Bed level Grooming: Maximal assistance Where Assessed-Grooming: Bed level Upper Body Bathing: Maximal assistance Where Assessed-Upper Body Bathing: Edge of bed;Bed level Lower Body Bathing: Other (comment) (total +2) Where Assessed-Lower Body Bathing: Edge of bed;Bed level Upper Body Dressing: Maximal assistance Where Assessed-Upper Body Dressing: Edge of bed Lower Body  Dressing: Other (Comment) (total +2) Where Assessed-Lower Body Dressing: Bed level Toileting: Other (Comment) (total assist) Where Assessed-Toileting: Bed level Toilet Transfer Method: Not assessed Tub/Shower Transfer: Not assessed Gaffer Transfer: Not assessed Mobility  Bed Mobility Bed Mobility: Rolling Right;Rolling Left;Supine to Sit;Sit to Supine Rolling Right: Total Assistance - Patient < 25% Rolling Left: Maximal Assistance - Patient 25-49% Supine to Sit: Total Assistance - Patient < 25% Sit to Supine: Total Assistance - Patient < 25%  Session Note:  Pt in bed to start session writhing around reaching to different positions on the bed rails pushing himself to the left of the bed.  Max instructional cueing to try and relax and let therapist straighten him up.  Soft whisper noted when therapist would ask him questions and he would respond, requiring multiple attempts for therapist to understand him.  Severe LUE tone noted in the elbow, wrist, and digit flexors.  Therapist was able to range all extremities approximately 1/2-3/4 of full ROM, but tone was at close to 4/4 on the Modified [place] scale.  After stretching they would rebound back into the flexed position.  Had him work on bathing at bed level to start with continued max demonstrational cueing to stop reaching around with the RUE to different parts of the bed and trying to move himself.  He was able to wash his face as well as his chest and abdomen but needed assist with arms and especially the left hand.  He transitioned to sitting with total assist.  Severe extensor tone noted with transition in the trunk as well as the LLE.  Once up tone reduced in the LLE after 2-3  mins and therapist was able to place it in a flexed position.  Pt then exhibited increased pushing to the left with left  cervical and trunk flexion.  Max assist to maintain sitting balance while completing UB bathing and washing his LEs.  Max assist for donning  pullover shirt.  He did complete sit to stand with max assist, but therapist was unable to manage LB clothing or bathing from this position.  He transitioned back to supine with total assist and complete washing front peri area with setup and buttocks at total assist for rolling.  LB dressing total assist in bed as well.  Call button and phone in reach at end of session with resting hand splint in place on the left hand.  Nursing made aware of need for bed pads on the bed based on writhing movements and pt's history of seizure.    Discharge Criteria: Patient will be discharged  from OT if patient refuses treatment 3 consecutive times without medical reason, if treatment goals not met, if there is a change in medical status, if patient makes no progress towards goals or if patient is discharged from hospital.  The above assessment, treatment plan, treatment alternatives and goals were discussed and mutually agreed upon: by patient  Deivi Huckins OTR/L 04/15/2021, 5:38 PM

## 2021-04-15 NOTE — Evaluation (Signed)
Physical Therapy Assessment and Plan  Patient Details  Name: Howard White MRN: [id number] Date of Birth: [birth year]  PT Diagnosis: Abnormal posture, Abnormality of gait, Ataxia, Cognitive deficits, Coordination disorder, Difficulty walking, Hemiparesis non-dominant, Hypertonia, Impaired sensation and Muscle spasms Rehab Potential: Fair ELOS: 3.5-4 weeks   Today's Date: 04/15/2021 PT Individual Time: 1300-1400 PT Individual Time Calculation (min): 60 min    Hospital Problem: Principal Problem:   Acute stroke of basal ganglia ([place])   Past Medical History:  Past Medical History:  Diagnosis Date  . Brain cancer ([place])   . Seizures ([place])   . Thyroid disease    Hypothyroid   Past Surgical History:  Past Surgical History:  Procedure Laterality Date  . brain cancer    . BRAIN SURGERY      Assessment & Plan Clinical Impression: Patient is a 60 y.o. year old male with history ofleft frontal craniotomy s/p astrocytoma resection, seizure disorder, CVA 11/21 with residual left hemiplegia who was admitted on 04/08/2021 with reports of multiple falls,increasing sleepiness with decrease in interaction and progressive weakness. CT head was negative for acute finding and showed advanced small vessel disease as well as remote left cerebral glioma resection. MRI brain done showing acute small vessel infarct involving right corona radiata and adjacent basal ganglia. MRA was negative for occlusion or stenosis. He has had issues with tremors right upper extremity but no evidence of seizure per neurology. EEG done showing evidence of epileptogenicity and cortical dysfunction left frontocentral region c/w underlying craniotomy but no seizures seen during recording. Carotid Dopplers were negative for significant ICA stenosis. 2D echo showed EF of 60 to 65% no wall abnormality.  Dr. Leonie Man felt that stroke was secondary to small vessel disease and recommended DAPT x3 weeks followed by ASA alone.  Speech therapy evaluations revealed cognitive motor speech deficits with decreased attention and reported to be worse since stroke. Therapy ongoing and patient noted to have balance deficits with decrease in motor coordination RUE/RLE, tendency to push to left and posteriorly as well as anxiety,restlessness and poor safety awareness. At baseline patient was wheelchair bound but was working on standing attempt with outpatient therapy.Family assist with ADL tasks.Marland Kitchen He continues to be limited by left hemiplegia with spasticity and CIR was recommended to follow plan. Patient currently requires total with mobility secondary to muscle weakness and muscle joint tightness, decreased cardiorespiratoy endurance, decreased midline orientation, decreased attention to left and decreased motor planning, decreased initiation, decreased attention, decreased awareness, decreased problem solving, decreased safety awareness, decreased memory and delayed processing and decreased sitting balance, decreased standing balance, decreased postural control, hemiplegia and decreased balance strategies.  Prior to hospitalization, patient was min with mobility and lived with Family in a House home.  Home access is  Level entry.  Patient will benefit from skilled PT intervention to maximize safe functional mobility, minimize fall risk and decrease caregiver burden for planned discharge home with 24 hour assist.  Anticipate patient will benefit from follow up [facility] at discharge.  PT - End of Session Activity Tolerance: Tolerates < 10 min activity, no significant change in vital signs Endurance Deficit: Yes PT Assessment Rehab Potential (ACUTE/IP ONLY): Fair PT Barriers to Discharge: Decreased caregiver support;Incontinence;Neurogenic Bowel & Bladder;Lack of/limited family support;Other (comments) PT Barriers to Discharge Comments: tone, multiple CVAs PT Patient demonstrates impairments in the following area(s):  Balance;Behavior;Endurance;Motor;Nutrition;Pain;Perception;Safety;Sensory;Skin Integrity PT Transfers Functional Problem(s): Bed Mobility;Bed to Chair;Car;Furniture PT Locomotion Functional Problem(s): Ambulation;Wheelchair Mobility PT Plan PT Intensity: Minimum of 1-2 x/day ,45 to 90  minutes PT Frequency: 5 out of 7 days PT Duration Estimated Length of Stay: 3.5-4 weeks PT Treatment/Interventions: Ambulation/gait training;Discharge planning;Functional mobility training;Psychosocial support;Therapeutic Activities;Visual/perceptual remediation/compensation;Balance/vestibular training;Disease management/prevention;Neuromuscular re-education;Skin care/wound management;Therapeutic Exercise;Wheelchair propulsion/positioning;Cognitive remediation/compensation;DME/adaptive equipment instruction;Pain management;Splinting/orthotics;UE/LE Strength taining/ROM;Community reintegration;Functional electrical stimulation;Patient/family education;Stair training;UE/LE Coordination activities PT Transfers Anticipated Outcome(s): ModA PT Locomotion Anticipated Outcome(s): MaxA PT Recommendation Follow Up Recommendations: Home health PT;Skilled nursing facility;24 hour supervision/assistance Patient destination: Home Equipment Recommended: To be determined Equipment Details: owns RW, wc   PT Evaluation Precautions/Restrictions Precautions Precautions: Fall Precaution Comments: severe L hemi tone, R hemi dyskinesia (?), Pushing L, aphasia Restrictions Weight Bearing Restrictions: No General   Vital SignsTherapy Vitals Temp: 98.4 F (36.9 C) Pulse Rate: 90 Resp: 16 BP: 123/78 Patient Position (if appropriate): Lying Oxygen Therapy SpO2: 94 % O2 Device: Room Air Pain   Home Living/Prior Functioning Home Living Available Help at Discharge: Family;Available 24 hours/day Type of Home: House Home Access: Level entry Home Layout: Two level;Able to live on main level with bedroom/bathroom Bathroom  Shower/Tub: Chiropodist: Standard Bathroom Accessibility: Yes  Lives With: Family Prior Function Level of Independence: Needs assistance with ADLs;Needs assistance with tranfers;Needs assistance with homemaking;Needs assistance with gait  Able to Take Stairs?: No Driving: No Vision/Perception  Vision - Assessment Additional Comments: difficult to assess Perception Perception: Impaired Inattention/Neglect: Impaired-to be further tested in functional context Spatial Orientation: poor general body spatial awareness Praxis Praxis: Impaired Praxis Impairment Details: Motor planning;Perseveration;Initiation  Cognition Overall Cognitive Status: Impaired/Different from baseline Arousal/Alertness: Awake/alert Orientation Level: Oriented to person Sustained Attention: Impaired Memory: Impaired Awareness: Impaired Problem Solving: Impaired Behaviors: Restless Safety/Judgment: Impaired Sensation Sensation Light Touch: Impaired by gross assessment Hot/Cold: Not tested Proprioception: Impaired by gross assessment Stereognosis: Impaired by gross assessment Additional Comments: difficult to assess Coordination Gross Motor Movements are Fluid and Coordinated: No Fine Motor Movements are Fluid and Coordinated: No Coordination and Movement Description: R hemi body writhing movements consistentlly, severe L hemi body tone Heel Shin Test: unable to follow commands + L LE hypertonicity + R LE dyskinesia Motor  Motor Motor: Abnormal tone;Motor perseverations;Abnormal postural alignment and control;Hemiplegia;Ataxia;Clonus Motor - Skilled Clinical Observations: Severe L hemibody hypertonicity, R hemi body dyskinesia, truncal and cervical hypertonicity present, L Pusher   Trunk/Postural Assessment  Cervical Assessment Cervical Assessment: Exceptions to [facility] (maintains very flexed head and L cervical rotation) Thoracic Assessment Thoracic Assessment: Exceptions to [facility]  (increased kyphosis, L lateral trunk lean due to tone and pushing) Lumbar Assessment Lumbar Assessment: Exceptions to [facility] (posterior pelvic tilt) Postural Control Postural Control: Deficits on evaluation Head Control: maintains veyr forward head Trunk Control: Pushes L, maintains L lateral trunk lean sitting and supine Righting Reactions: delayed and inadequate Protective Responses: delayed and inadequate  Balance Balance Balance Assessed: Yes Static Sitting Balance Static Sitting - Balance Support: Feet supported;Right upper extremity supported Static Sitting - Level of Assistance: 1: +1 Total assist;2: Max assist Dynamic Sitting Balance Dynamic Sitting - Balance Support: Feet supported Dynamic Sitting - Level of Assistance: 1: +1 Total assist Extremity Assessment      RLE Assessment RLE Assessment: Exceptions to [facility] RLE Strength RLE Overall Strength Comments: Unable to follow commands, but moving R LE WFL RLE Tone RLE Tone: Mild Body Part - Modified Ashworth Scale: Quadriceps;Hamstrings Hamstrings - Modified Ashworth Scale for Grading Hypertonia RLE: Slight increase in muscle tone, manifested by a catch and release or by minimal resistance at the end of the range of motion when the affected part(s) is moved in flexion or  extension QUADRICEPS - Modified Ashworth Scale for Grading Hypertonia RLE: Slight increase in muscle tone, manifested by a catch and release or by minimal resistance at the end of the range of motion when the affected part(s) is moved in flexion or extension LLE Assessment LLE Assessment: Exceptions to [facility] General Strength Comments: Unable to follow commands to compelte MMTs + severe tone LLE Tone LLE Tone: Severe;Hypertonic;Modified Ashworth Body Part - Modified Ashworth Scale: Hamstrings;Quadriceps;Gastrocnemius Hamstrings - Modified Ashworth Scale for Grading Hypertonia LLE: Slight increase in muscle tone, manifested by a catch, followed by minimal resistance  throughout the remainder (less than half) of the ROM Quadriceps - Modified Ashworth Scale for Grading Hypertonia LLE: Affected part(s) rigid in flexion or extension Gastrocnemius - Modified Ashworth Scale for Grading Hypertonia LLE: Affected part(s) rigid in flexion or extension  Care Tool Care Tool Bed Mobility Roll left and right activity   Roll left and right assist level: Maximal Assistance - Patient 25 - 49%    Sit to lying activity   Sit to lying assist level: Total Assistance - Patient < 25%    Lying to sitting edge of bed activity   Lying to sitting edge of bed assist level: Total Assistance - Patient < 25%     Care Tool Transfers Sit to stand transfer Sit to stand activity did not occur: Safety/medical concerns      Chair/bed transfer Chair/bed transfer activity did not occur: Safety/medical concerns       Toilet transfer Toilet transfer activity did not occur: Safety/medical concerns      Scientist, product/process development transfer activity did not occur: Safety/medical concerns        Care Tool Locomotion Ambulation Ambulation activity did not occur: Safety/medical concerns        Walk 10 feet activity Walk 10 feet activity did not occur: Safety/medical concerns       Walk 50 feet with 2 turns activity Walk 50 feet with 2 turns activity did not occur: Safety/medical concerns      Walk 150 feet activity Walk 150 feet activity did not occur: Safety/medical concerns      Walk 10 feet on uneven surfaces activity Walk 10 feet on uneven surfaces activity did not occur: Safety/medical concerns      Stairs Stair activity did not occur: Safety/medical concerns        Walk up/down 1 step activity Walk up/down 1 step or curb (drop down) activity did not occur: Safety/medical concerns     Walk up/down 4 steps activity did not occuR: Safety/medical concerns  Walk up/down 4 steps activity      Walk up/down 12 steps activity Walk up/down 12 steps activity did not occur:  Safety/medical concerns      Pick up small objects from floor Pick up small object from the floor (from standing position) activity did not occur: Safety/medical concerns      Wheelchair Will patient use wheelchair at discharge?: Yes Type of Wheelchair: Manual Wheelchair activity did not occur: Safety/medical concerns      Wheel 50 feet with 2 turns activity Wheelchair 50 feet with 2 turns activity did not occur: Safety/medical concerns    Wheel 150 feet activity Wheelchair 150 feet activity did not occur: Safety/medical concerns      Refer to Care Plan for Long Term Goals  SHORT TERM GOAL WEEK 1 PT Short Term Goal 1 (Week 1): Patient will roll B with no more than MaxA PT Short Term Goal 2 (Week 1): Patient will complete  sit <> supine with no more than ModA PT Short Term Goal 3 (Week 1): Patient will complete sit <> stand with LRAD and +2 MaxA safely  Recommendations for other services: None   Skilled Therapeutic Intervention Mobility Bed Mobility Bed Mobility: Rolling Right;Rolling Left;Supine to Sit;Sit to Supine Rolling Right: Total Assistance - Patient < 25% Rolling Left: Maximal Assistance - Patient 25-49% Supine to Sit: Total Assistance - Patient < 25% Sit to Supine: Maximal Assistance - Patient 25-49% Locomotion  Gait Ambulation: No Gait Gait: No Stairs / Additional Locomotion Stairs: No Wheelchair Mobility Wheelchair Mobility: No  Patient received supine in bed, consistently writhing within the bed with head banging against L bed rail. Patient consistently pushing himself that way and into L bed rail. Mother there to consent to PT eval. Patient not completely responsive to his name and could not indicate whether he was in pain or not. Mother unable to discern whether writhing increases or not with pain. Patient with severe L Hemibody tone up to a 4 on MAS, especially in L hand. PT unable to break extensor tone in L LE at some points. Patients with increased truncal  tone contributing to further L lateral lean even when supine in the bed. Mother reporting that he had multiple falls since last d/c, but overall made progress in OP PT and was ambulating with a cane (didn't specify which type). Patient unable to dissociate lower body from trunk to complete rolling in bed. He had significant difficulty following single step commands and was easily internally distracted. PT able to have patient sit up on edge of bed with MaxA/ TotalA. Patient pushing to the L with R UE and unable to follow cues to reach for foot of bed to prevent further pushing L. Patient also with significant forward head flexion with PT questioning ability to maintain breath support in this posture. Patient with significant truncal rigidity when he would try to extend neck, patient would lose balance posteriorly. Patient unable to safely stand or transfer at this time without +2 assist and is likely unsafe to remain in wc at this time, even TIS due to tone and consistent movement in R hemi body. Patient returning supine with TotalA. PT placing pillows under sheets to further pad bed rails, though he would benefit from padded bedrails. Bed alarm on, call light within reach, mother at bedside.   Discharge Criteria: Patient will be discharged from PT if patient refuses treatment 3 consecutive times without medical reason, if treatment goals not met, if there is a change in medical status, if patient makes no progress towards goals or if patient is discharged from hospital.  The above assessment, treatment plan, treatment alternatives and goals were discussed and mutually agreed upon: by patient and by family  Debbora Dus 04/15/2021, 4:09 PM

## 2021-04-15 NOTE — Progress Notes (Signed)
PROGRESS NOTE   Subjective/Complaints:  Somnolent, per RN slept ok , incont bowel and bladder ROS- did not respond to questions Objective:   No results found. Recent Labs    04/15/21 0504  WBC 16.6*  HGB 18.1*  HCT 51.1  PLT 365   Recent Labs    04/15/21 0504  NA 139  K 4.1  CL 99  CO2 22  GLUCOSE 113*  BUN 42*  CREATININE 1.47*  CALCIUM 10.3    Intake/Output Summary (Last 24 hours) at 04/15/2021 0950 Last data filed at 04/15/2021 0700 Gross per 24 hour  Intake 220 ml  Output --  Net 220 ml        Physical Exam: Vital Signs Blood pressure 129/74, pulse 100, temperature 97.8 F (36.6 C), resp. rate 18, height 5\' 10"  (1.778 m), weight 79.6 kg, SpO2 93 %.   General: No acute distress Mood and affect are appropriate Heart: Regular rate and rhythm no rubs murmurs or extra sounds Lungs: Clear to auscultation, breathing unlabored, no rales or wheezes Abdomen: Positive bowel sounds, soft nontender to palpation, nondistended Extremities: No clubbing, cyanosis, or edema Skin: No evidence of breakdown, no evidence of rash Neurologic: Cranial nerves II through XII intact, motor strength could not assess due to somnolence Sensory exam normal sensation to light touch and proprioception in bilateral upper and lower extremities Increased flexor tone LUE, Increased ext tone LLE Musculoskeletal: reduced Left elbow ext due to tone   Assessment/Plan: 1. Functional deficits which require 3+ hours per day of interdisciplinary therapy in a comprehensive inpatient rehab setting.  Physiatrist is providing close team supervision and 24 hour management of active medical problems listed below.  Physiatrist and rehab team continue to assess barriers to discharge/monitor patient progress toward functional and medical goals  Care Tool:  Bathing              Bathing assist       Upper Body Dressing/Undressing Upper  body dressing        Upper body assist Assist Level: Moderate Assistance - Patient 50 - 74%    Lower Body Dressing/Undressing Lower body dressing            Lower body assist Assist for lower body dressing: Maximal Assistance - Patient 25 - 49%     Toileting Toileting    Toileting assist Assist for toileting: Maximal Assistance - Patient 25 - 49% (incontinent of B+B)     Transfers Chair/bed transfer  Transfers assist           Locomotion Ambulation   Ambulation assist              Walk 10 feet activity   Assist           Walk 50 feet activity   Assist           Walk 150 feet activity   Assist           Walk 10 feet on uneven surface  activity   Assist           Wheelchair     Assist  Wheelchair 50 feet with 2 turns activity    Assist            Wheelchair 150 feet activity     Assist          Blood pressure 129/74, pulse 100, temperature 97.8 F (36.6 C), resp. rate 18, height 5\' 10"  (1.778 m), weight 79.6 kg, SpO2 93 %.    Medical Problem List and Plan: 1.L hemiplegia worsening due to 3 strokes in total -has associated spasticity and contracture formation -patient may Shower? -ELOS/Goals:min A 17-20 days 2. Antithrombotics: -DVT/anticoagulation:Pharmaceutical:Lovenox -antiplatelet therapy: DAPT x3 weeks followed by ASA alone. 3. Pain Management:Tylenol as needed 4. Mood:LCSW to follow for evaluation and support -antipsychotic agents: N/AA 5. Neuropsych: This patientis not fullycapable of making decisions on hisown behalf. 6. Skin/Wound Care:Routine pressure-relief measures. 7. Fluids/Electrolytes/Nutrition:Monitor intake/output. Check CMET in am.  8.History of seizure disorder: Continue Lamictal twice daily 9.Left spastic hemiparesis: Continue baclofen 5 mg 3 times daily-->titrated upwards? 10.History  of anxiety disorder: Continue Lexapro. Hydroxyzine 3 times daily. 11.Insomnia: Continue melatonin at bedtime. 12.Hypothyroid: Stable on supplement. 13. Spasticity/forming contractures- this has significantly worsened since new CVA, may need Botox as inpt 14.  Somnolence check UA C and S, also with prerenal azotemia, IVF at noc, trial amantadine, hx of multiple cerebral insults LOS: 1 days A FACE TO FACE EVALUATION WAS PERFORMED  Charlett Blake 04/15/2021, 9:50 AM

## 2021-04-15 NOTE — Evaluation (Signed)
Speech Language Pathology Assessment and Plan  Patient Details  Name: Howard White MRN: [id number] Date of Birth: 03/29/1961  SLP Diagnosis: Dysarthria;Cognitive Impairments;Speech and Language deficits;Dysphagia;Voice disorder  Rehab Potential: Good ELOS: 3.5-4 weeks    Today's Date: 04/15/2021 SLP Individual Time: 1100-1200     Hospital Problem: Principal Problem:   Acute stroke of basal ganglia (HCC)  Past Medical History:  Past Medical History:  Diagnosis Date  . Brain cancer ([place])   . Seizures ([place])   . Thyroid disease    Hypothyroid   Past Surgical History:  Past Surgical History:  Procedure Laterality Date  . brain cancer    . BRAIN SURGERY      Assessment / Plan / Recommendation Clinical Impression   Howard White is a 60 year old male with history ofleft frontal craniotomy s/p astrocytoma resection, seizure disorder, CVA 11/21 with residual left hemiplegia who was admitted on 04/08/2021 with reports of multiple falls,increasing sleepiness with decrease in interaction and progressive weakness. CT head was negative for acute finding and showed advanced small vessel disease as well as remote left cerebral glioma resection. MRI brain done showing acute small vessel infarct involving right corona radiata and adjacent basal ganglia. MRA was negative for occlusion or stenosis. He has had issues with tremors right upper extremity but no evidence of seizure per neurology. EEG done showing evidence of epileptogenicity and cortical dysfunction left frontocentral region c/w underlying craniotomy but no seizures seen during recording. Carotid Dopplers were negative for significant ICA stenosis. 2D echo showed EF of 60 to 65% no wall abnormality.  Dr. Leonie Man felt that stroke was secondary to small vessel disease and recommended DAPT x3 weeks followed by ASA alone. Speech therapy evaluations revealed cognitive motor speech deficits with decreased attention and reported to be  worse since stroke. Therapy ongoing and patient noted to have balance deficits with decrease in motor coordination RUE/RLE, tendency to push to left and posteriorly as well as anxiety,restlessness and poor safety awareness. At baseline patient was wheelchair bound but was working on standing attempt with outpatient therapy.Family assist with ADL tasks.Marland Kitchen He continues to be limited by left hemiplegia with spasticity and CIR was recommended to follow plan  Pt presents with severe dysarthria, mild dysphagia, severe cognitive deficits, and severe expressive/receptivie communication deficits. Pts ability to follow commands was significantly Impacted by spasticity and positioning in bed. He presented with aphonia and significantly reduced vocal intensity when responding to questions. He responded to yes/no questions verbally 30% of the time and eventually communicated yes/no effectively with "thumbs up /thumbs down" for simple biographical yes/no questions. He demonstrated decreased sustained attention to tasks after 3-4 exchanges and unable to redirect due to consistent movements of arms and legs throughout entire evaluation. He gestured x2 to communicate he wanted more water and to indicate his brief was wet. Pt able to verbalize age and city we are in with low vocal intensity but  no further responses were given and decreased attention noted after 2-3 questions whe patient began movements in bed. Pt followed simple commands with 70% accuracy and moderate visual cues.   Pt was downgraded to dys 2 diet due to minimal mastication with chopped Howard toast on tray and pocketing of whole piece of Howard toast. Coughing noted 1/5 trials with thin liquids but improvement noted once positioned to more upright position. He was able to protrude tongue for SLP to remove food. He tolerated pureed and dys 2 trials with additional time and sips of thin liquid to  clear minimal residue. Pt demonstrated decreased left labial  seal and spillage of thin liquids x1. Recommend pt continue with dys 2 diet and thin liquids and upgrade to dys 3 once tolerating safely.  Pt will benefit from skilled SLP intervention to increase communication, cognition, and dyspagia     Skilled Therapeutic Interventions          SLP evaluation completed. Plan of care reviewed with patient and mother in room.  SLP Assessment  Patient will need skilled [place] [facility] during CIR admission    Recommendations  Patient destination: Home Follow up Recommendations: 24 hour supervision/assistance;Home Health SLP;Outpatient SLP Equipment Recommended: None recommended by SLP    SLP Frequency 3 to 5 out of 7 days   SLP Duration  SLP Intensity  SLP Treatment/Interventions 3.5-4 weeks  Minumum of 1-2 x/day, 30 to 90 minutes  Cognitive remediation/compensation;Patient/family education;Speech/Language facilitation    Pain Pain Assessment Pain Scale: Faces Faces Pain Scale: No hurt  Prior Functioning Cognitive/Linguistic Baseline: Within functional limits Baseline deficit details: mother reports cognition was wfl prior to this hospitalization. Pt paid bills online and communicated all wants/needs effectively. Type of Home: House  Lives With: Family Available Help at Discharge: Family;Available 24 hours/day  SLP Evaluation Cognition Overall Cognitive Status: Impaired/Different from baseline Arousal/Alertness: Awake/alert Orientation Level: Oriented to person;Oriented to place Attention: Sustained Sustained Attention: Impaired Sustained Attention Impairment: Functional basic;Verbal basic Memory: Impaired Awareness: Impaired Awareness Impairment: Intellectual impairment Problem Solving: Impaired Problem Solving Impairment: Verbal basic;Functional basic Behaviors: Restless Safety/Judgment: Impaired  Comprehension Auditory Comprehension Overall Auditory Comprehension: Impaired Yes/No Questions: Impaired Basic  Biographical Questions: 76-100% accurate Complex Questions: 50-74% accurate Commands: Impaired One Step Basic Commands: 50-74% accurate Two Step Basic Commands: 25-49% accurate Multistep Basic Commands: 0-24% accurate Complex Commands: 0-24% accurate Conversation: Simple Interfering Components: Attention;Motor planning Visual Recognition/Discrimination Discrimination: Not tested Reading Comprehension Reading Status: Not tested Expression Expression Primary Mode of Expression: Verbal Verbal Expression Overall Verbal Expression: Impaired Initiation: Impaired Repetition: Impaired Level of Impairment: Word level Naming: Impairment Responsive: 51-75% accurate Confrontation: Impaired Convergent: Not tested Divergent: Not tested Pragmatics: No impairment Other Verbal Expression Comments: effecitive communication with simple gestures for yes/no and pointing to needs. Written Expression Dominant Hand: Right Oral Motor Oral Motor/Sensory Function Overall Oral Motor/Sensory Function: Moderate impairment Facial ROM: Reduced left;Suspected CN VII (facial) dysfunction Facial Symmetry: Abnormal symmetry left;Suspected CN VII (facial) dysfunction Motor Speech Overall Motor Speech: Impaired Respiration: Impaired Level of Impairment: Word Phonation: Low vocal intensity Articulation: Impaired Level of Impairment: Word Intelligibility: Intelligibility reduced Word: 50-74% accurate Phrase: 0-24% accurate Sentence: 0-24% accurate Conversation: 0-24% accurate Motor Planning: Impaired Level of Impairment: Word Motor Speech Errors: Unaware Effective Techniques: Slow rate;Increased vocal intensity;Over-articulate  Care Tool Care Tool Cognition Expression of Ideas and Wants     Understanding Verbal and Non-Verbal Content     Memory/Recall Ability *first 3 days only       PMSV Assessment  PMSV Trial Intelligibility: Intelligibility reduced Word: 50-74% accurate Phrase: 0-24%  accurate Sentence: 0-24% accurate Conversation: 0-24% accurate  Bedside Swallowing Assessment General Date of Onset: 04/08/21 Previous Swallow Assessment: 04/10/2021 Diet Prior to this Study: Regular;Thin liquids Temperature Spikes Noted: No Respiratory Status: Room air Baseline Vocal Quality: Aphonic;Hoarse;Low vocal intensity Volitional Swallow: Unable to elicit  Oral Care Assessment   Ice Chips Ice chips: Within functional limits Thin Liquid Thin Liquid: Impaired Presentation: Straw Oral Phase Impairments: Reduced labial seal;Poor awareness of bolus;Reduced lingual movement/coordination Oral Phase Functional Implications: Left anterior spillage Pharyngeal  Phase  Impairments: Cough - Delayed Nectar Thick Nectar Thick Liquid: Not tested Honey Thick Honey Thick Liquid: Not tested Puree Puree: Impaired Oral Phase Impairments: Reduced labial seal;Reduced lingual movement/coordination;Poor awareness of bolus Oral Phase Functional Implications: Left anterior spillage Solid Solid: Impaired Oral Phase Impairments: Reduced labial seal;Reduced lingual movement/coordination;Poor awareness of bolus;Impaired mastication Oral Phase Functional Implications: Left anterior spillage BSE Assessment Risk for Aspiration Impact on safety and function: Mild aspiration risk Other Related Risk Factors:  (increased tone and unable to sit in optimal position for swallow.)  Short Term Goals: Week 1: SLP Short Term Goal 1 (Week 1): Pt will demonstrate sustained attention to functional tasks for 4-5 minutes with min A verbal cues. SLP Short Term Goal 2 (Week 1): Pt will respond to functional yes/no questions verbally or with gestures with 80% accuracy mod A visual cues. SLP Short Term Goal 3 (Week 1): Pt will tolerate dys 3 diet and thin liquids with no overt s/sx of aspiration or penetration on 9/10 trials. SLP Short Term Goal 4 (Week 1): Pt will follow basic one step commands in 60% of  opportunities with mod A verbal cues. SLP Short Term Goal 5 (Week 1): Pt will utilize an increased vocal intensity at the word level to achieve 50% intelligibility with max a visual and verbal cues.  Refer to Care Plan for Long Term Goals  Recommendations for other services: None   Discharge Criteria: Patient will be discharged from SLP if patient refuses treatment 3 consecutive times without medical reason, if treatment goals not met, if there is a change in medical status, if patient makes no progress towards goals or if patient is discharged from hospital.  The above assessment, treatment plan, treatment alternatives and goals were discussed and mutually agreed upon: by patient  [place] 04/15/2021, 4:55 PM

## 2021-04-16 ENCOUNTER — Inpatient Hospital Stay (HOSPITAL_COMMUNITY): Payer: PPO

## 2021-04-16 ENCOUNTER — Ambulatory Visit: Payer: PPO | Admitting: Physical Therapy

## 2021-04-16 LAB — CBC WITH DIFFERENTIAL/PLATELET
Abs Immature Granulocytes: 0.03 10*3/uL (ref 0.00–0.07)
Basophils Absolute: 0 10*3/uL (ref 0.0–0.1)
Basophils Relative: 0 %
Eosinophils Absolute: 0.2 10*3/uL (ref 0.0–0.5)
Eosinophils Relative: 2 %
HCT: 49.2 % (ref 39.0–52.0)
Hemoglobin: 16.9 g/dL (ref 13.0–17.0)
Immature Granulocytes: 0 %
Lymphocytes Relative: 10 %
Lymphs Abs: 1.2 10*3/uL (ref 0.7–4.0)
MCH: 31.5 pg (ref 26.0–34.0)
MCHC: 34.3 g/dL (ref 30.0–36.0)
MCV: 91.6 fL (ref 80.0–100.0)
Monocytes Absolute: 1.4 10*3/uL — ABNORMAL HIGH (ref 0.1–1.0)
Monocytes Relative: 12 %
Neutro Abs: 8.7 10*3/uL — ABNORMAL HIGH (ref 1.7–7.7)
Neutrophils Relative %: 76 %
Platelets: 322 10*3/uL (ref 150–400)
RBC: 5.37 MIL/uL (ref 4.22–5.81)
RDW: 12.4 % (ref 11.5–15.5)
WBC: 11.5 10*3/uL — ABNORMAL HIGH (ref 4.0–10.5)
nRBC: 0 % (ref 0.0–0.2)

## 2021-04-16 MED ORDER — LORAZEPAM 0.5 MG PO TABS
0.2500 mg | ORAL_TABLET | Freq: Four times a day (QID) | ORAL | Status: DC | PRN
  Administered 2021-04-17 – 2021-05-09 (×14): 0.25 mg via ORAL
  Filled 2021-04-16 (×15): qty 1

## 2021-04-16 MED ORDER — BACLOFEN 10 MG PO TABS
10.0000 mg | ORAL_TABLET | Freq: Three times a day (TID) | ORAL | Status: DC
  Administered 2021-04-16 – 2021-04-19 (×10): 10 mg via ORAL
  Filled 2021-04-16 (×11): qty 1

## 2021-04-16 MED ORDER — TRAZODONE HCL 50 MG PO TABS
25.0000 mg | ORAL_TABLET | Freq: Every evening | ORAL | Status: DC | PRN
Start: 2021-04-16 — End: 2021-04-17
  Administered 2021-04-16: 25 mg via ORAL
  Filled 2021-04-16: qty 1

## 2021-04-16 MED ORDER — AMANTADINE HCL 50 MG/5ML PO SOLN
100.0000 mg | Freq: Two times a day (BID) | ORAL | Status: DC
  Administered 2021-04-16 – 2021-05-07 (×43): 100 mg via ORAL
  Filled 2021-04-16 (×45): qty 10

## 2021-04-16 NOTE — Progress Notes (Signed)
Speech Language Pathology Daily Session Note  Patient Details  Name: Howard White MRN: [id number] Date of Birth: Dec 06, 1960  Today's Date: 04/16/2021 SLP Individual Time: 1130-1230    Short Term Goals: Week 1: SLP Short Term Goal 1 (Week 1): Pt will demonstrate sustained attention to functional tasks for 4-5 minutes with min A verbal cues. SLP Short Term Goal 2 (Week 1): Pt will respond to functional yes/no questions verbally or with gestures with 80% accuracy mod A visual cues. SLP Short Term Goal 3 (Week 1): Pt will tolerate dys 3 diet and thin liquids with no overt s/sx of aspiration or penetration on 9/10 trials. SLP Short Term Goal 4 (Week 1): Pt will follow basic one step commands in 60% of opportunities with mod A verbal cues. SLP Short Term Goal 5 (Week 1): Pt will utilize an increased vocal intensity at the word level to achieve 50% intelligibility with max a visual and verbal cues.  Skilled Therapeutic Interventions:   Skilled SLP intervention focused on communication and dysphagia. Mother present in room during session. Pt sleeping but eventually woke up with bed repositioning and washcloth to wipe face. He followed commands in context for wiping face, opening eyes, opening mouth and protruding tongue. He intermittently fell back asleep during session and needed tactile stimulation and slp calling patients name to wake up. He demonstrated increased use of nodding head yes/no (60% of opportunuties) for answering simple questions during meal and food preferences. He pointed to items on tray that he wanted during meal. Pt consumed mashed sweet potatoes on tray. Total assist needed for feeding. Pt held food in mouth and needed max visual cues and additional time for mastication and  To swallow food in mouth. Mild oral residue on lingual surface with mashed sweet potatoes but patient was able to clear with small sips of thin liquid via straw. No overt/sx of aspiration or penetration noted  during meal. Pt downgraded to dys 1 diet this session due to decreased attention and alertness, pt holding food in mouth,  And minimal mastication with solids. Continue with thin liquids.   Pain Pain Assessment Pain Scale: Faces Faces Pain Scale: No hurt  Therapy/Group: Individual Therapy  Howard White 04/16/2021, 12:38 PM

## 2021-04-16 NOTE — Progress Notes (Signed)
Occupational Therapy Session Note  Patient Details  Name: Howard White MRN: [id number] Date of Birth: October 22, 1961  Today's Date: 04/16/2021 OT Individual Time: 5631-4970 OT Individual Time Calculation (min): 69 min    Short Term Goals: Week 1:  OT Short Term Goal 1 (Week 1): Pt will complete static sitting balance EOB with min assist in preparation for selfcare tasks. OT Short Term Goal 2 (Week 1): Pt will complete rolling in the bed with max assist to the right and mod assist to the left to assist with toileting tasks and LB clothing management. OT Short Term Goal 3 (Week 1): Pt will tolerate hand splint for positioning of the LUE into slight extension. OT Short Term Goal 4 (Week 1): Pt will complete UB bathing sitting EOB with no more than mod assist and mod instructional cueing for  Skilled Therapeutic Interventions/Progress Updates:    Pt in bed with NT feeding him to start session.  His mother was present for start of session, so time was used to discuss pt's current level of assist and current limitations with flexor and extensor tone seen yesterday during evals.  She was understanding that pt may need greater assist than previously PTA but is still optimistic that he can make some progress.  Therapist expressed hope in this as well, but wanted to be realistic with his situation.  He then worked on bathing supine to sit.  He was given the washcloth in supine with HOB slightly elevated ant told to wash his front peri area.  He needed mod demonstrational cueing using the RUE as he would wash the side of his upper leg and then down his leg instead of his front peri area.  Therapist helped place his hand where he needed to wash and he proceeded.  He needed max assist for rolling to the left in order for therapist to provide total assist for washing his buttocks.  Total assist for rolling to the right with initial increased extensor tone in the knee present, but when therapist assisted with flexion  of the knee, it reduced in intensity.  He donned brief in supine at total assist before transferring to the EOB with max assist.  From there he worked on bathing and dressing.  He was unable to motor plan wringing out the washcloth with the RUE so therapist set him up with it and he was able to wash his face, chest, and abdomen with decreased thoroughness and max instructional cueing.  Therapist assisted with washing both the right and left UEs thoroughly.  He was able to wash his upper legs and part of his lower legs, with therapist providing total assist for crossing the LLE over the right knee.  He was able to complete donning a pullover shirt with max assist and then he worked on threading his pants at the same level.  Total +2 (pt 50%) for sit to stand to pull them up over his hips.  Overall, max assist was needed for sitting balance EOB with posterior lean.  Pt still with slight pushing to the left but less severe this session.  He maintained moderate thoracic and cervical flexion with inability to achieve even neutral alignment in the neck or back without max facilitation.  Completed sit to stand in the [place] with max assist as well with pt attempting to step over the front of the [place] with the RLE after standing.  Max demonstrational cueing to place his foot back on the surface.  Still with increased lumbar  and cervical flexion in standing as well.  Returned to supine at end of session with max assist.  Noted pt with less extensor tone in the LLE this session as well as decreased flexor tone in the LUE compared to yesterday.  He was able to tolerate PROM stretching to the digits, wrist, and elbow with less resistance from tone after in supine.  Also noted pt much more fatigued and sleepy this session compared to yesterday.  Resting hand splint placed on hand secondary to reduced tone.  He had been maintaining a rolled up washcloth in his hand since earlier PT session that had also helped his skin integrity  and ROM.  Call button and phone in reach with safety alarm and tele-sitter in place.    Therapy Documentation Precautions:  Precautions Precautions: Fall Precaution Comments: severe L hemi tone, R hemi dyskinesia (?), Pushing L, aphasia Restrictions Weight Bearing Restrictions: No  Pain: Pain Assessment Pain Scale: Faces Pain Score: 0-No pain Faces Pain Scale: No hurt ADL: See Care Tool Section for some details of mobility and selfcare  Therapy/Group: Individual Therapy  Cherrie Franca OTR/L 04/16/2021, 3:49 PM

## 2021-04-16 NOTE — Progress Notes (Signed)
Orthopedic Tech Progress Note Patient Details:  Howard White 11-24-1960 [id number] Called in order to [place]. Patient ID: Howard White, male   DOB: 10/13/1961, 60 y.o.   MRN: [id number]   [name] 04/16/2021, 5:25 PM

## 2021-04-16 NOTE — Progress Notes (Signed)
Patient received 50 mg trazodone at hs followed by 0.5 mg ativan and 25 mg benadryl at  2:47 am. Baclofen 5 mg po tid started yesterday. He has been sedated all day likely due to polypharmacy and restless night. Trazodone and ativan doses decreased and will order sleep chart to monitor sleep wake cycle.

## 2021-04-16 NOTE — Progress Notes (Signed)
Physical Therapy Session Note  Patient Details  Name: Howard White MRN: [id number] Date of Birth: 1961-07-13  Today's Date: 04/16/2021 PT Individual Time: 3614-4315 PT Individual Time Calculation (min): 26 min  and  Today's Date: 04/16/2021 PT Missed Time: 49 Minutes Missed Time Reason: Patient fatigue  Short Term Goals: Week 1:  PT Short Term Goal 1 (Week 1): Patient will roll B with no more than MaxA PT Short Term Goal 2 (Week 1): Patient will complete sit <> supine with no more than ModA PT Short Term Goal 3 (Week 1): Patient will complete sit <> stand with LRAD and +2 MaxA safely  Skilled Therapeutic Interventions/Progress Updates:    Pt received asleep, supine in bed with his mother, [name], present. [name] able to consent to PT session. Therapist attempted to provide verbal and tactile stimulus to awaken pt; however, he remained in a deep sleep with eyes closed. Phlebotomy RN in/out for lab draw and pt continued to remain asleep despite therapist providing verbal stimulus to increase arousal. Therapy tech arrived to provide +2 assist. Donned shorts in supine +2 total assist with pt's eyes intermittently fluttering open but immediately returned to being closed. Continued to notice L LE extension tone - asked PA to order dynamic night splint for L LE to assist with ankle DF positioning. Rolled R/L in bed with +2 total assist to pull shorts over hips total assist. Pt remained asleep during this task despite cuing and stimulation to increase arousal and pt participation. Supine>sitting R EOB +2 total assist for B LE management and trunk upright with pt requiring total assist to maintain trunk upright with excessive trunk and neck flexion posturing. Upon sitting EOB, pt started fluttering his eyes open more but then returned back to sleep. Despite attempts to increase arousal and alertness for pt participation in session pt remained asleep therefore provided +2 total assist to return to supine.  Doffed shorts as above. Pt left supine in bed with needs in reach, wash cloth placed in L hand to improve finger positioning due to flexor tone, pillows positioned under bed sheets for safety from bedrails as pt restless in the bed, his mother present, and bed alarm on. Spoke with PA regarding pt's need for seizure pads on bed. Missed 49 minutes of skilled physical therapy.  Therapy Documentation Precautions:  Precautions Precautions: Fall Precaution Comments: severe L hemi tone, R hemi dyskinesia (?), Pushing L, aphasia Restrictions Weight Bearing Restrictions: No  Pain:   No indications of pain.   Therapy/Group: Individual Therapy  Tawana Scale , PT, DPT, CSRS  04/16/2021, 7:54 AM

## 2021-04-16 NOTE — Progress Notes (Signed)
PROGRESS NOTE   Subjective/Complaints:  Awake and restless this am , follows simple commands but non verbal  ROS- did not respond to questions Objective:   No results found. Recent Labs    04/15/21 0504  WBC 16.6*  HGB 18.1*  HCT 51.1  PLT 365   Recent Labs    04/15/21 0504  NA 139  K 4.1  CL 99  CO2 22  GLUCOSE 113*  BUN 42*  CREATININE 1.47*  CALCIUM 10.3    Intake/Output Summary (Last 24 hours) at 04/16/2021 0758 Last data filed at 04/15/2021 1840 Gross per 24 hour  Intake 200 ml  Output 2 ml  Net 198 ml        Physical Exam: Vital Signs Blood pressure 116/74, pulse 87, temperature 97.9 F (36.6 C), temperature source Oral, resp. rate 20, height 5\' 10"  (1.778 m), weight 79.6 kg, SpO2 93 %.   General: No acute distress Mood and affect are appropriate Heart: Regular rate and rhythm no rubs murmurs or extra sounds Lungs: Clear to auscultation, breathing unlabored, no rales or wheezes Abdomen: Positive bowel sounds, soft nontender to palpation, nondistended Extremities: No clubbing, cyanosis, or edema Skin: No evidence of breakdown, no evidence of rash Neurologic:Motor 0/5 LUE, trace hip/knee ext synergy LLE TOne  MAS 4 in Left biceps, wrist and finger flexors  Musculoskeletal: reduced Left elbow ext due to tone, wrist and fingers in flexed position on L side    Assessment/Plan: 1. Functional deficits which require 3+ hours per day of interdisciplinary therapy in a comprehensive inpatient rehab setting.  Physiatrist is providing close team supervision and 24 hour management of active medical problems listed below.  Physiatrist and rehab team continue to assess barriers to discharge/monitor patient progress toward functional and medical goals  Care Tool:  Bathing    Body parts bathed by patient: Chest,Abdomen,Face,Front perineal area   Body parts bathed by helper: Right arm,Left  arm,Buttocks,Right upper leg,Left upper leg,Face,Left lower leg,Right lower leg     Bathing assist Assist Level: 2 Helpers     Upper Body Dressing/Undressing Upper body dressing   What is the patient wearing?: Pull over shirt    Upper body assist Assist Level: Maximal Assistance - Patient 25 - 49%    Lower Body Dressing/Undressing Lower body dressing      What is the patient wearing?: Incontinence brief     Lower body assist Assist for lower body dressing: Dependent - Patient 0%     Toileting Toileting    Toileting assist Assist for toileting: 2 Helpers     Transfers Chair/bed transfer  Transfers assist  Chair/bed transfer activity did not occur: Safety/medical concerns        Locomotion Ambulation   Ambulation assist   Ambulation activity did not occur: Safety/medical concerns          Walk 10 feet activity   Assist  Walk 10 feet activity did not occur: Safety/medical concerns        Walk 50 feet activity   Assist Walk 50 feet with 2 turns activity did not occur: Safety/medical concerns         Walk 150 feet activity   Assist  Walk 150 feet activity did not occur: Safety/medical concerns         Walk 10 feet on uneven surface  activity   Assist Walk 10 feet on uneven surfaces activity did not occur: Safety/medical concerns         Wheelchair     Assist Will patient use wheelchair at discharge?: Yes Type of Wheelchair: Manual Wheelchair activity did not occur: Safety/medical concerns         Wheelchair 50 feet with 2 turns activity    Assist    Wheelchair 50 feet with 2 turns activity did not occur: Safety/medical concerns       Wheelchair 150 feet activity     Assist  Wheelchair 150 feet activity did not occur: Safety/medical concerns       Blood pressure 116/74, pulse 87, temperature 97.9 F (36.6 C), temperature source Oral, resp. rate 20, height 5\' 10"  (1.778 m), weight 79.6 kg, SpO2 93  %.    Medical Problem List and Plan: 1.L hemiplegia worsening due to recurrent R CR/BG infarct, has associated spasticity and contracture formation- spasticity has worsened significantly with new infarct , cognitive status declined Cont CIR PT, OT, SLP, speech dysarthric hypophonic and delayed -ELOS/Goals:min A 17-20 days 2. Antithrombotics: -DVT/anticoagulation:Pharmaceutical:Lovenox -antiplatelet therapy: DAPT x3 weeks followed by ASA alone. 3. Pain Management:Tylenol as needed 4. Mood:LCSW to follow for evaluation and support -antipsychotic agents: N/AA 5. Neuropsych: This patientis not fullycapable of making decisions on hisown behalf. 6. Skin/Wound Care:Routine pressure-relief measures. 7. Fluids/Electrolytes/Nutrition:Monitor intake/output. Check CMET in am.  8.History of seizure disorder: Continue Lamictal twice daily 9.Left spastic hemiparesis: Continue baclofen 5 mg 3 times daily-->titrated upwards? 10.History of anxiety disorder: Continue Lexapro. Hydroxyzine 3 times daily. 11.Insomnia: Continue melatonin at bedtime. 12.Hypothyroid: Stable on supplement. 13. Spasticity/forming contractures- this has significantly worsened since new CVA, increas baclofen to 10mg  TID, monitor sedation may need Botox as inpt 14. Leukocytosis without fever  Negative UA , recheck UA, recheck CXR although no hypoxia or resp distress LOS: 2 days A FACE TO FACE EVALUATION WAS PERFORMED  Charlett Blake 04/16/2021, 7:58 AM

## 2021-04-17 DIAGNOSIS — D72829 Elevated white blood cell count, unspecified: Secondary | ICD-10-CM

## 2021-04-17 LAB — COMPREHENSIVE METABOLIC PANEL
ALT: 113 U/L — ABNORMAL HIGH (ref 0–44)
AST: 237 U/L — ABNORMAL HIGH (ref 15–41)
Albumin: 4.6 g/dL (ref 3.5–5.0)
Alkaline Phosphatase: 74 U/L (ref 38–126)
Anion gap: 7 (ref 5–15)
BUN: 59 mg/dL — ABNORMAL HIGH (ref 6–20)
CO2: 26 mmol/L (ref 22–32)
Calcium: 10.2 mg/dL (ref 8.9–10.3)
Chloride: 109 mmol/L (ref 98–111)
Creatinine, Ser: 1.27 mg/dL — ABNORMAL HIGH (ref 0.61–1.24)
GFR, Estimated: >60 mL/min (ref >60)
Glucose, Bld: 124 mg/dL — ABNORMAL HIGH (ref 70–99)
Potassium: 4.3 mmol/L (ref 3.5–5.1)
Sodium: 142 mmol/L (ref 135–145)
Total Bilirubin: 1.3 mg/dL — ABNORMAL HIGH (ref 0.3–1.2)
Total Protein: 7.5 g/dL (ref 6.5–8.1)

## 2021-04-17 MED ORDER — SODIUM CHLORIDE 0.9 % IV SOLN: INTRAVENOUS | Status: AC

## 2021-04-17 MED ORDER — QUETIAPINE FUMARATE 25 MG PO TABS
25.0000 mg | ORAL_TABLET | Freq: Two times a day (BID) | ORAL | Status: DC
  Administered 2021-04-17 – 2021-04-18 (×2): 25 mg via ORAL
  Filled 2021-04-17 (×2): qty 1

## 2021-04-17 NOTE — Progress Notes (Signed)
Speech Language Pathology Daily Session Note  Patient Details  Name: Howard White MRN: [id number] Date of Birth: Nov 30, 1960  Today's Date: 04/17/2021 SLP Individual Time: 8270-7867 SLP Individual Time Calculation (min): 40 min  Short Term Goals: Week 1: SLP Short Term Goal 1 (Week 1): Pt will demonstrate sustained attention to functional tasks for 4-5 minutes with min A verbal cues. SLP Short Term Goal 2 (Week 1): Pt will respond to functional yes/no questions verbally or with gestures with 80% accuracy mod A visual cues. SLP Short Term Goal 3 (Week 1): Pt will tolerate dys 3 diet and thin liquids with no overt s/sx of aspiration or penetration on 9/10 trials. SLP Short Term Goal 4 (Week 1): Pt will follow basic one step commands in 60% of opportunities with mod A verbal cues. SLP Short Term Goal 5 (Week 1): Pt will utilize an increased vocal intensity at the word level to achieve 50% intelligibility with max a visual and verbal cues.  Skilled Therapeutic Interventions:   Patient seen for skilled ST session with focus on dysphagia and speech-language, cognitive goals. Patient sitting in tilt WC in nurses station when SLP arrived. Per RN and NT, he has been communicating via gestures for water and oral care. When SLP provided patient with water with straw, he did not exhibit ability to suck through straw and so was then given water in cup. He was able to hold cup and bring water to mouth, however no swallow response observed and water almost immediately spilled out onto shirt. SLP then setup oral care and patient performed with toothette sponges, requiring cues to cease. When patient attempting verbal communication, voice was so low that no real words were able to be heard. He did perform some gestures but difficult to determine meaning aside from seeming to want to move lap tray away from his stomach. SLP spoke with patient's RN, who stated that he has eaten very little of PO's, exhibits holding of  liquids and solids and requires prolonged amount of time, significant amount of cueing to consume the small amount of PO's he will eat during a meal. Patient continues to benefit from skilled SLP intervention to maximize cognitive-linguistic, speech/voice and swallow function prior to discharge.   Pain Pain Assessment Pain Scale: Faces Faces Pain Scale: No hurt  Therapy/Group: Individual Therapy  Sonia Baller, MA, CCC-SLP Speech Therapy

## 2021-04-17 NOTE — Consult Note (Addendum)
Neurology Consultation Note  Consult Requested by: Dr. Letta Pate  Reason for Consult: restless  Consult Date: 04/17/21   The history was obtained from the RNs and chart.  During history and examination, all items were not able to obtain unless otherwise noted.  History of Present Illness:  Howard White is a 60 y.o. 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   Pt was admitted in 09/2020 for left arm and leg weakness. CT head no acute finding but left frontal craniotomy with resection cavity.  MRI showed right coronary/basal ganglia infarct.  CTA head and neck intracranial atherosclerosis but no LVO.  EF 60 to 65%.  LDL 131 and A1c 5.4.  Discharged on DAPT and Lipitor 40.  He follows with Dr. Leonie Man at [facility] after discharge.  On 04/08/2021 he was admitted again for multiple falls while working with PT.  CT no acute abnormality MRI showed acute right CR/BG infarct.  MRA head and neck unremarkable.  Carotid Doppler negative, TCD unremarkable, EF 60 to 65%.  EEG showed evidence of epileptogenicity and cortical dysfunction in the left frontocentral region consistent with underlying craniotomy.  No seizure.  LDL 43 and A1c 5.5.  Patient again discharged to [facility] on 04/14/2021 on DAPT and Lipitor 40.  Since the stroke, patient was found to be restless, keeps pulling off monitor leads, bed rails, IV, keep moving right upper and lower extremity.  Put on trazodone, getting more drowsiness, so the dose was decreased last night.  Creatinine elevated on 04/15/2021, received IV fluid bolus x1.  Have also found to have elevated LFTs. UA and CXR neg so far, no fever.   I called mom and talked with her, she stated that prior to current stroke, at baseline, he was able to walk himself at home with walker or cane for short distance but more in wheelchair. Using right side arm  and leg pretty well, no restless or involuntary movement.  Left arm contracted but more so after current stroke.  Per RN and mom, today pt seems more awake and clam than yesterday, less sleepy after trazodone dose decrease.   Past Medical History:  Diagnosis Date  . Brain cancer ([place])   . Seizures ([place])   . Thyroid disease    Hypothyroid    Past Surgical History:  Procedure Laterality Date  . brain cancer    . BRAIN SURGERY      Family History  Problem Relation Age of Onset  . Brain cancer Other   . Thyroid disease Other   . Cancer Other   . Diabetes Other   . Stroke Other     Social History:  reports that he has never smoked. He has never used smokeless tobacco. He reports current alcohol use. He reports that he does not use drugs.  Allergies:  Allergies  Allergen Reactions  . Lactose Intolerance (Gi)     No current facility-administered medications on file prior to encounter.   Current Outpatient Medications on File Prior to Encounter  Medication Sig Dispense Refill  . acetaminophen (TYLENOL) 325 MG tablet Take 2 tablets (650 mg total) by mouth every 6 (six) hours as needed for mild pain (or Fever >/= 101).    Marland Kitchen aspirin EC 81 MG EC tablet Take 1 tablet (81 mg total) by mouth daily. Swallow whole. (Patient taking differently: Take 81 mg by mouth every evening. Swallow  whole.) 30 tablet 11  . atorvastatin (LIPITOR) 40 MG tablet Take 40 mg by mouth every evening.    . Baclofen 5 MG TABS Take 5-10 mg by mouth as directed. Take 1 tablet (5 mg) in the morning and Take 2 tablets (10 mg) at bedtime    . calcium carbonate (TUMS - DOSED IN MG ELEMENTAL CALCIUM) 500 MG chewable tablet Chew 1 tablet (200 mg of elemental calcium total) by mouth daily. (Patient taking differently: Chew 1 tablet by mouth daily as needed for indigestion.) 30 tablet 0  . clopidogrel (PLAVIX) 75 MG tablet Take 1 tablet (75 mg total) by mouth daily.    Marland Kitchen docusate sodium (COLACE) 100 MG capsule Take 2  capsules (200 mg total) by mouth 2 (two) times daily. (Patient taking differently: Take 200 mg by mouth 2 (two) times daily as needed for mild constipation.) 10 capsule 0  . escitalopram (LEXAPRO) 10 MG tablet Take 1 tablet (10 mg total) by mouth daily. 30 tablet 0  . famotidine (PEPCID) 20 MG tablet Take 1 tablet (20 mg total) by mouth daily. 30 tablet 0  . fluticasone (FLONASE) 50 MCG/ACT nasal spray Place 1 spray into both nostrils 2 (two) times daily as needed for allergies.    . hydrOXYzine (ATARAX/VISTARIL) 25 MG tablet Take 1 tablet (25 mg total) by mouth 3 (three) times daily.    Marland Kitchen ketotifen (ZADITOR) 0.025 % ophthalmic solution Place 1 drop into both eyes 2 (two) times daily as needed (for itchy eyes). 5 mL 0  . lamoTRIgine (LAMICTAL) 150 MG tablet TAKE 1 AND 1/2 TABS EVERY MORNING AND 2 TAB EVERY EVENING (Patient taking differently: Take 225-300 mg by mouth See admin instructions. TAKE 1 AND 1/2 TABS ( 225mg )  EVERY MORNING AND 2 TAB (300mg ) EVERY EVENING) 315 tablet 3  . LORazepam (ATIVAN) 0.5 MG tablet Take 1 tablet (0.5 mg total) by mouth every 6 (six) hours as needed for anxiety.  0  . melatonin 3 MG TABS tablet Take 2 tablets (6 mg total) by mouth at bedtime. (Patient taking differently: Take 6 mg by mouth at bedtime as needed (sleep).) 30 tablet 0  . Multiple Vitamins-Minerals (MULTIVITAMIN WITH MINERALS) tablet Take 1 tablet by mouth daily.    . polyethylene glycol (MIRALAX / GLYCOLAX) 17 g packet Take 17 g by mouth daily. (Patient taking differently: Take 17 g by mouth daily as needed for moderate constipation.) 14 each 0  . SYNTHROID 112 MCG tablet Take 1 tablet (112 mcg total) by mouth at bedtime. 30 tablet 0    Review of Systems: A full ROS was attempted today and was not able to be performed.    Physical Examination: Temp:  [97.7 F (36.5 C)-98.6 F (37 C)] 98.6 F (37 C) (06/03 1351) Pulse Rate:  [68-103] 103 (06/03 1351) Resp:  [16-20] 16 (06/03 1351) BP:  (104-146)/(66-110) 112/78 (06/03 1410) SpO2:  [93 %-96 %] 96 % (06/03 1351)  General - well nourished, well developed, in no apparent distress but restless with right arm.    Ophthalmologic - fundi not visualized due to noncooperation.    Cardiovascular - regular rhythm and rate  Neuro - awake, alert, eyes open, severe dysarthria and hypophonia, but able to answer orientation questions in low voice, orientated to age, place, but not to time. Limited language output but following all simple commands, able to name 2/3 and able to repeat 3-5 word sentences, however again hypophonic. No gaze palsy but bilateral gaze incomplete, tracking bilaterally, blinking  to visual threat bilaterally, PERRL. Left facial droop. Tongue protrusion difficult to perform. Right UEs 5/5, no drift, FTN grossly intact but slow, however, restless but can be distracted and directed. LUE flexion contracture. RLE 5/5, no drift, LLE 4/5 no drift. Sensation subjectively symmetrical but not reliable per observation, gait not tested.   Data Reviewed: CT HEAD WO CONTRAST  Result Date: 04/10/2021 CLINICAL DATA:  Stroke follow-up.  Possible worsening balance. EXAM: CT HEAD WITHOUT CONTRAST TECHNIQUE: Contiguous axial images were obtained from the base of the skull through the vertex without intravenous contrast. COMPARISON:  Apr 08, 2021. FINDINGS: Motion degraded exam.  Within this limitation: Brain: No substantial change in edema associated with the recent right corona radiata/basal ganglia infarct seen on May 25th MRI. No evidence of interval acute large vascular territory infarct. No acute hemorrhage or progressive mass effect. Similar appearance of the left frontal CSF density resection cavity which bibasilar communicates with the left lateral ventricle, better characterized on recent MRI. No progressive ventriculomegaly. Similar patchy white matter hypodensities, which are nonspecific. Vascular: Calcific atherosclerosis without  evidence of a hyperdense vessel. Skull: No acute fracture.  Prior left frontal craniotomy. Sinuses/Orbits: Visualized sinuses are largely clear. Small retention cyst in the right sphenoid sinus. Unremarkable orbits on limited assessment. Other: No mastoid effusions. IMPRESSION: 1. No substantial change in edema associated with the recent right corona radiata/basal ganglia infarct seen on May 25th MRI. No acute hemorrhage or progressive mass effect. 2. No evidence of interval/acute intracranial abnormality on this motion degraded exam. MRI could provide more sensitive evaluation for acute infarct if clinically indicated. Electronically Signed   By: Margaretha Sheffield MD   On: 04/10/2021 15:49   CT Head Wo Contrast  Result Date: 04/08/2021 CLINICAL DATA:  Weakness.  Falls. EXAM: CT HEAD WITHOUT CONTRAST TECHNIQUE: Contiguous axial images were obtained from the base of the skull through the vertex without intravenous contrast. COMPARISON:  Brain MRI 09/26/2020 FINDINGS: Brain: Cystic density in the left frontal region correlating with remote history of astrocytoma resection. Confluent chronic small vessel ischemia in the cerebral white matter with perforator infarct at the right basal ganglia on a chronic basis. Suspect accelerated disease from radiotherapy. No acute hemorrhage, hydrocephalus, or worrisome mass effect Vascular: Atheromatous calcification Skull: Diffuse heterogeneity of the skull, likely radiation effects. Unremarkable remote left frontal craniotomy. Sinuses/Orbits: Negative IMPRESSION: 1. No acute finding. 2. Remote left cerebral glioma resection. 3. 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 Electronically Signed   By: Monte Fantasia M.D.   On: 04/08/2021 10:50   MR ANGIO HEAD WO CONTRAST  Result Date: 04/09/2021 CLINICAL DATA:  Acute right basal ganglia/corona radiata infarct on MRI. EXAM: MRA HEAD WITHOUT CONTRAST TECHNIQUE: Angiographic images of the [place] were acquired using MRA technique without intravenous contrast. COMPARISON:  Head and neck CTA 09/26/2020 FINDINGS: Anterior circulation: The internal carotid arteries are widely patent from skull base to carotid termini. ACAs and MCAs are patent without evidence of a proximal branch occlusion or significant proximal stenosis. No aneurysm identified. Posterior circulation: The visualized distal vertebral arteries are widely patent to the basilar with the right being dominant. The left PICA and right AICA appear dominant. Patent SCAs are seen bilaterally. The basilar artery is widely patent. There is a fetal origin of the left PCA. Both PCAs are patent with branch vessel atherosclerosis including a moderate to severe proximal left P3 stenosis as seen on the prior CTA, however there is no significant P1 or proximal P2  stenosis. No aneurysm is identified. Anatomic variants: Fetal left PCA. IMPRESSION: No major intracranial branch occlusion or flow limiting proximal stenosis. Electronically Signed   By: Logan Bores M.D.   On: 04/09/2021 16:21   MR ANGIO NECK WO CONTRAST  Result Date: 04/09/2021 CLINICAL DATA:  Acute right basal ganglia/corona radiata infarct on MRI. EXAM: MRA NECK WITHOUT CONTRAST TECHNIQUE: Angiographic images of the neck were acquired using MRA technique without intravenous contrast. Carotid stenosis measurements (when applicable) are obtained utilizing NASCET criteria, using the distal internal carotid diameter as the denominator. COMPARISON:  Head and neck CTA 09/26/2020 FINDINGS: Aortic arch: The aortic arch was not included on this noncontrast examination. The included portions of the brachiocephalic and subclavian arteries are widely patent. Right carotid system: Patent without evidence of stenosis or dissection. Left carotid system: Patent without evidence of stenosis or dissection. Vertebral arteries: Patent with antegrade flow bilaterally and with the right vertebral artery being  dominant. No evidence of a significant stenosis or dissection. IMPRESSION: Negative neck MRA. Electronically Signed   By: Logan Bores M.D.   On: 04/09/2021 16:31   MR BRAIN WO CONTRAST  Result Date: 04/08/2021 CLINICAL DATA:  Following EXAM: MRI HEAD WITHOUT CONTRAST TECHNIQUE: Multiplanar, multiecho pulse sequences of the brain and surrounding structures were obtained without intravenous contrast. COMPARISON:  09/26/2020 FINDINGS: Motion artifact is present.  SWI was not obtained. Brain: There is restricted diffusion involving the right corona radiata extending from body of caudate toward the lentiform nucleus. This is anterior to a similarly oriented chronic infarct. There is a CSF intensity resection cavity of the left frontal lobe abutting or communicating with the left lateral ventricle. Ventricles and sulci are stable in size and configuration. No mass effect. Confluent areas of T2 hyperintensity in the supratentorial white matter are nonspecific and may reflect chronic microvascular ischemic changes and/or therapy related changes. Vascular: Major vessel flow voids at the skull base are preserved. Skull and upper cervical spine: Normal marrow signal is grossly preserved. Sinuses/Orbits: Paranasal sinuses are aerated. Orbits are unremarkable. Other: Sella is unremarkable.  Mastoid air cells are clear. IMPRESSION: Motion degraded study. Acute small vessel infarct involving right corona radiata and adjacent basal ganglia. Chronic findings detailed above. Electronically Signed   By: Macy Mis M.D.   On: 04/08/2021 12:42   DG CHEST PORT 1 VIEW  Result Date: 04/16/2021 CLINICAL DATA:  Leukocytosis, stroke. EXAM: PORTABLE CHEST 1 VIEW COMPARISON:  Multiple priors, most recent Apr 10, 2021. FINDINGS: Similar mild patient rotation. Low lung volumes with streaky left basilar opacities. No visible pleural effusions or pneumothorax on this single AP supine radiograph. Cardiomediastinal silhouette is similar.  IMPRESSION: Streaky left basilar opacities, favored to reflect atelectasis given low lung volumes and similar appearance on the priors. Aspiration or pneumonia is not excluded. Electronically Signed   By: Margaretha Sheffield MD   On: 04/16/2021 08:33   DG CHEST PORT 1 VIEW  Result Date: 04/10/2021 CLINICAL DATA:  60 year old with cough. EXAM: PORTABLE CHEST 1 VIEW COMPARISON:  11/04/2020 FINDINGS: Lungs are clear. Heart size is within normal limits and stable. Patient is mildly rotated towards the left. Bone structures are unremarkable. Negative for a pneumothorax. IMPRESSION: No active disease. Electronically Signed   By: Markus Daft M.D.   On: 04/10/2021 14:26   EEG adult  Result Date: 04/10/2021 Lora Havens, MD     04/10/2021 10:17 AM Patient Name: Howard White MRN: [id number] Epilepsy Attending: Lora Havens Referring Physician/Provider: Tollie Eth, NP Date: 04/09/2021  Duration: Patient history: 60 year old male with left frontal craniotomy about 20 years ago, history of seizures who presented after multiple falls and excessive drowsiness.  EEG to evaluate for seizures. Level of alertness: Awake,asleep AEDs during EEG study: LTG Technical aspects: This EEG study was done with scalp electrodes positioned according to the 10-20 International system of electrode placement. Electrical activity was acquired at a sampling rate of 500Hz  and reviewed with a high frequency filter of 70Hz  and a low frequency filter of 1Hz . EEG data were recorded continuously and digitally stored. Description: The posterior dominant rhythm consists of 8 Hz activity of moderate voltage (25-35 uV) seen predominantly in posterior head regions, symmetric and reactive to eye opening and eye closing.  Sleep was characterized by vertex waves, sleep spindles (12 to 14 Hz), maximal frontocentral region.  EEG showed continuous generalized sharply contoured 3 to 5 Hz theta-delta slowing with overriding 15 to 18 Hz beta  activity admixed with left frontocentral spikes, maximal F3/C3 consistent with underlying breach artifact.  Hyperventilation and photic stimulation were not performed.   ABNORMALITY -Spike, left frontocentral region -Breach artifact, left frontocentral region IMPRESSION: This study showed evidence of epileptogenicity and cortical dysfunction in left frontocentral region consistent with underlying craniotomy.  No seizures were seen throughout the recording. Lora Havens   ECHOCARDIOGRAM COMPLETE  Result Date: 04/08/2021    ECHOCARDIOGRAM REPORT   Patient Name:   PREM COYKENDALL Date of Exam: 04/08/2021 Medical Rec #:  [id number]    Height:       70.0 in Accession #:    [id number]   Weight:       188.0 lb Date of Birth:  [birth year]   BSA:          2.033 m Patient Age:    15 years     BP:           137/79 mmHg Patient Gender: M            HR:           74 bpm. Exam Location:  Inpatient Procedure: 2D Echo, Cardiac Doppler and Color Doppler Indications:    CVA  History:        Patient has prior history of Echocardiogram examinations, most                 recent 09/27/2020. Stroke. Brain cancer.  Sonographer:    Dustin Flock Referring Phys: [id number] Leslee Home  Sonographer Comments: No subcostal window. IMPRESSIONS  1. Left ventricular ejection fraction, by estimation, is 60 to 65%. The left ventricle has normal function. The left ventricle has no regional wall motion abnormalities. Left ventricular diastolic parameters are consistent with Grade I diastolic dysfunction (impaired relaxation).  2. Right ventricular systolic function is normal. The right ventricular size is normal.  3. The mitral valve is normal in structure. Trivial mitral valve regurgitation. No evidence of mitral stenosis.  4. The aortic valve is normal in structure. Aortic valve regurgitation is not visualized. No aortic stenosis is present.  5. The inferior vena cava is normal in size with greater than 50% respiratory variability, suggesting  right atrial pressure of 3 mmHg. FINDINGS  Left Ventricle: Left ventricular ejection fraction, by estimation, is 60 to 65%. The left ventricle has normal function. The left ventricle has no regional wall motion abnormalities. The left ventricular internal cavity size was normal in size. There is  no left ventricular hypertrophy. Left ventricular diastolic parameters are consistent with Grade I diastolic dysfunction (impaired  relaxation). Normal left ventricular filling pressure. Right Ventricle: The right ventricular size is normal. No increase in right ventricular wall thickness. Right ventricular systolic function is normal. Left Atrium: Left atrial size was normal in size. Right Atrium: Right atrial size was normal in size. Pericardium: There is no evidence of pericardial effusion. Mitral Valve: The mitral valve is normal in structure. Trivial mitral valve regurgitation. No evidence of mitral valve stenosis. Tricuspid Valve: The tricuspid valve is normal in structure. Tricuspid valve regurgitation is not demonstrated. No evidence of tricuspid stenosis. Aortic Valve: The aortic valve is normal in structure. Aortic valve regurgitation is not visualized. No aortic stenosis is present. Pulmonic Valve: The pulmonic valve was normal in structure. Pulmonic valve regurgitation is not visualized. No evidence of pulmonic stenosis. Aorta: The aortic root is normal in size and structure. Venous: The inferior vena cava is normal in size with greater than 50% respiratory variability, suggesting right atrial pressure of 3 mmHg. IAS/Shunts: No atrial level shunt detected by color flow Doppler.  LEFT VENTRICLE PLAX 2D LVIDd:         4.60 cm  Diastology LVIDs:         3.00 cm  LV e' medial:    6.85 cm/s LV PW:         1.10 cm  LV E/e' medial:  7.6 LV IVS:        0.90 cm  LV e' lateral:   8.92 cm/s LVOT diam:     2.30 cm  LV E/e' lateral: 5.8 LV SV:         81 LV SV Index:   40 LVOT Area:     4.15 cm  RIGHT VENTRICLE RV Basal  diam:  2.90 cm RV S prime:     14.00 cm/s TAPSE (M-mode): 1.9 cm LEFT ATRIUM             Index       RIGHT ATRIUM           Index LA diam:        3.30 cm 1.62 cm/m  RA Area:     14.30 cm LA Vol (A2C):   37.2 ml 18.30 ml/m RA Volume:   33.20 ml  16.33 ml/m LA Vol (A4C):   34.6 ml 17.02 ml/m LA Biplane Vol: 36.3 ml 17.85 ml/m  AORTIC VALVE LVOT Vmax:   102.00 cm/s LVOT Vmean:  62.500 cm/s LVOT VTI:    0.195 m  AORTA Ao Root diam: 3.30 cm MITRAL VALVE MV Area (PHT): 4.89 cm    SHUNTS MV Decel Time: 155 msec    Systemic VTI:  0.20 m MV E velocity: 51.80 cm/s  Systemic Diam: 2.30 cm MV A velocity: 60.40 cm/s MV E/A ratio:  0.86 Mihai Croitoru MD Electronically signed by Sanda Klein MD Signature Date/Time: 04/08/2021/4:28:55 PM    Final    VAS US CAROTID  Result Date: 04/10/2021 Carotid Arterial Duplex Study Patient Name:  KAVEN CUMBIE  Date of Exam:   04/09/2021 Medical Rec #: [id number]     Accession #:    [id number] Date of Birth: [birth year]    Patient Gender: M Patient Age:   59Y Exam Location:  [facility] Procedure:      VAS US CAROTID Referring Phys: [id number] Leslee Home --------------------------------------------------------------------------------  Performing Technologist: Darlin Coco RDMS,RVT  Examination Guidelines: A complete evaluation includes B-mode imaging, spectral Doppler, color Doppler, and power Doppler as needed of all accessible portions of each vessel. Bilateral testing is  considered an integral part of a complete examination. Limited examinations for reoccurring indications may be performed as noted.  Right Carotid Findings: +----------+--------+--------+--------+------------------+------------------+           PSV cm/sEDV cm/sStenosisPlaque DescriptionComments           +----------+--------+--------+--------+------------------+------------------+ CCA Prox  136     25                                intimal thickening  +----------+--------+--------+--------+------------------+------------------+ CCA Distal93      20                                intimal thickening +----------+--------+--------+--------+------------------+------------------+ ICA Prox  71      18                                intimal thickening +----------+--------+--------+--------+------------------+------------------+ ICA Distal81      26                                                   +----------+--------+--------+--------+------------------+------------------+ ECA       119     18                                                   +----------+--------+--------+--------+------------------+------------------+ +----------+--------+-------+--------+-------------------+           PSV cm/sEDV cmsDescribeArm Pressure (mmHG) +----------+--------+-------+--------+-------------------+ [id number]                                         +----------+--------+-------+--------+-------------------+ +---------+--------+--+--------+--+---------+ VertebralPSV cm/s54EDV cm/s17Antegrade +---------+--------+--+--------+--+---------+  Left Carotid Findings: +----------+--------+--------+--------+------------------+------------------+           PSV cm/sEDV cm/sStenosisPlaque DescriptionComments           +----------+--------+--------+--------+------------------+------------------+ CCA Prox  100     20                                intimal thickening +----------+--------+--------+--------+------------------+------------------+ CCA Distal103     22                                intimal thickening +----------+--------+--------+--------+------------------+------------------+ ICA Prox  58      21                                intimal thickening +----------+--------+--------+--------+------------------+------------------+ ICA Distal68      23                                                    +----------+--------+--------+--------+------------------+------------------+ ECA       114     13  intimal thickening +----------+--------+--------+--------+------------------+------------------+ +----------+--------+--------+--------+-------------------+           PSV cm/sEDV cm/sDescribeArm Pressure (mmHG) +----------+--------+--------+--------+-------------------+ [id number]                                         +----------+--------+--------+--------+-------------------+ +---------+--------+--+--------+-+---------+ VertebralPSV cm/s32EDV cm/s9Antegrade +---------+--------+--+--------+-+---------+   Summary: Right Carotid: The extracranial vessels were near-normal with only minimal wall                thickening or plaque. Left Carotid: The extracranial vessels were near-normal with only minimal wall               thickening or plaque. Vertebrals: Bilateral vertebral arteries demonstrate antegrade flow. *See table(s) above for measurements and observations.  Electronically signed by Antony Contras MD on 04/10/2021 at 1:50:46 PM.    Final    VAS [place] TRANSCRANIAL DOPPLER  Result Date: 04/10/2021  Transcranial Doppler Patient Name:  JOVEN MOM  Date of Exam:   04/09/2021 Medical Rec #: [id number]     Accession #:    [id number] Date of Birth: November 28, 1960    Patient Gender: M Patient Age:   59Y Exam Location:  [facility] Procedure:      VAS [place] TRANSCRANIAL DOPPLER Referring Phys: [id number] Tollie Eth --------------------------------------------------------------------------------  Indications: Stroke. Limitations: Seizure disorder- patient unable to remain stationary for portions              of examination. Comparison Study: No prior studies. Performing Technologist: Darlin Coco RDMS,RVT  Examination Guidelines: A complete evaluation includes B-mode imaging, spectral Doppler, color Doppler, and power Doppler as needed of all  accessible portions of each vessel. Bilateral testing is considered an integral part of a complete examination. Limited examinations for reoccurring indications may be performed as noted.  +----------+-------------+----------+-----------+-------+ RIGHT TCD Right VM (cm)Depth (cm)PulsatilityComment +----------+-------------+----------+-----------+-------+ MCA           40.00                 1.21            +----------+-------------+----------+-----------+-------+ ACA          -29.00                 0.97            +----------+-------------+----------+-----------+-------+ Term ICA      31.00                 1.58            +----------+-------------+----------+-----------+-------+ PCA           22.00                 1.16            +----------+-------------+----------+-----------+-------+ Opthalmic     30.00                 1.44            +----------+-------------+----------+-----------+-------+ ICA siphon    26.00                 1.45            +----------+-------------+----------+-----------+-------+ Vertebral    -24.00                 1.00            +----------+-------------+----------+-----------+-------+ Distal ICA    22.00  1.02            +----------+-------------+----------+-----------+-------+  +----------+------------+----------+-----------+-------+ LEFT TCD  Left VM (cm)Depth (cm)PulsatilityComment +----------+------------+----------+-----------+-------+ MCA          44.00                 1.05            +----------+------------+----------+-----------+-------+ ACA          -27.00                0.72            +----------+------------+----------+-----------+-------+ Term ICA     29.00                 1.10            +----------+------------+----------+-----------+-------+ PCA          35.00                 0.96            +----------+------------+----------+-----------+-------+ Opthalmic    24.00                  0.78            +----------+------------+----------+-----------+-------+ ICA siphon   29.00                 1.41            +----------+------------+----------+-----------+-------+ Vertebral    -19.00                0.77            +----------+------------+----------+-----------+-------+ Distal ICA   24.00                 0.95            +----------+------------+----------+-----------+-------+  +------------+-------+-------+             VM cm/sComment +------------+-------+-------+ Prox Basilar-38.00         +------------+-------+-------+ Dist Basilar-30.00         +------------+-------+-------+ +----------------------+----+ Right Lindegaard Ratio1.82 +----------------------+----+ +---------------------+----+ Left Lindegaard Ratio1.83 +---------------------+----+  Summary: This was a normal transcranial Doppler study, with normal flow direction and velocity of all identified vessels of the anterior and posterior circulations, with no evidence of stenosis, vasospasm or occlusion. There was no evidence of intracranial disease. *See table(s) above for TCD measurements and observations.  Diagnosing physician: Antony Contras MD Electronically signed by Antony Contras MD on 04/10/2021 at 1:51:13 PM.    Final     Assessment: 60 y.o. male PMH of astrocytoma with left frontal craniotomy resection 20 years ago, seizures, depression and hypothyroidism, right BG/CR stroke in 2011 was recently discharged to [facility] after again right BG/CR stroke late May. Since the stroke, patient was found to be restless on the right UE > LE and interfere with treatment and therapy. Put on trazodone, getting more drowsiness.  Creatinine elevated on 04/15/2021, received IV fluid bolus x1.  Have also found to have elevated LFTs. UA and CXR neg, no fever.  Pt condition more concerning for encephalopathy from AKI and elevated LFTs as well as post stroke delirium. Also could be vascular dementia with behavior  disturbance but seems developed too fast from his baseline before the stroke. Less likely new stroke or bleeding given no significant neuro change, less likely for seizure given following commands and cooperative on exam, EEG no seizure but breach rhythm on 5/27. Will recommend IVF for AKI, d/c lipitor 40 for  elevated LFTs. Change trazodone to seroquel bid. Encourage po intake. Will follow.   Plan: - IVF @ 75 for 3 days.  - d/c lipitor - monitor CBC BMP  - change trazodone to seroquel 25mg  bid - encourage po intake - will follow   Thank you for this consultation and allowing [place] to participate in the care of this patient.  Rosalin Hawking, MD PhD Stroke Neurology 04/17/2021 4:35 PM

## 2021-04-17 NOTE — Progress Notes (Signed)
Occupational Therapy Session Note  Patient Details  Name: Howard White MRN: [id number] Date of Birth: 1960/12/18  Today's Date: 04/17/2021 OT Individual Time: 1001-1105 OT Individual Time Calculation (min): 64 min    Short Term Goals: Week 1:  OT Short Term Goal 1 (Week 1): Pt will complete static sitting balance EOB with min assist in preparation for selfcare tasks. OT Short Term Goal 2 (Week 1): Pt will complete rolling in the bed with max assist to the right and mod assist to the left to assist with toileting tasks and LB clothing management. OT Short Term Goal 3 (Week 1): Pt will tolerate hand splint for positioning of the LUE into slight extension. OT Short Term Goal 4 (Week 1): Pt will complete UB bathing sitting EOB with no more than mod assist and mod instructional cueing for  Skilled Therapeutic Interventions/Progress Updates:    Pt in bed to start session resting.  Noted lesser tone in the LUE at the elbow, wrist, and digits.  He needed mod cueing for arousal and then became more restless once his eyes were opened, reaching around and trying to lift himself out of the bed and to the side.  Max instructional cueing to not try and move.  Began with rolling side to side to help decrease increased extensor tone in the LLE and trunk.  Max assist for rolling to the right with mod to the left.  Therapist placed LUE into slight adduction with elbow extension while rolling onto the scapula for increased weightbearing.  After several repetitions, with max demonstrational cueing for slow movements to help decrease tone, he transitioned to sitting with max assist.  Increased flexed head and trunk with pushing to the left noted as well.  Max assist for sitting balance.  Increased extensor tone noted in the left knee as well as flexor tone in the LUE while working on passive trunk and cervical elongation from therapist at max assist level.  He was unable to obtain upright posture without facilitation  from therapist.  Completed squat pivot transfer to the tilt in space wheelchair at total assist to the left.  Safety lap belt in place with full lap tray as well.  Rolled him outside of the room and down the hall with max instructional cueing and mod assist to work on cervical extension.  He was again unable to maintain position without therapist facilitation.  Increased flexor tone in the digits and elbow flexors.  Therapist positioned a palm guard in the left hand to help decrease fingers from digging into the palm.  He exhibits too much flexor tone in the hand and wrist to tolerate resting hand splint.  Finished session with pt tilted back in the wheelchair with call button in reach.  Nursing made aware of pt sitting up in the wheelchair and need for closer monitoring secondary to increased extensor tone.   Also recommend maximove for transfer to the bed.     Therapy Documentation Precautions:  Precautions Precautions: Fall Precaution Comments: severe L hemi tone, R hemi dyskinesia (?), Pushing L, aphasia Restrictions Weight Bearing Restrictions: No   Pain: Pain Assessment Pain Scale: Faces Pain Score: 0-No pain ADL: See Care Tool Section for some details of mobility and selfcare  Therapy/Group: Individual Therapy  Amariyah Bazar OTR/L 04/17/2021, 12:17 PM

## 2021-04-17 NOTE — Progress Notes (Signed)
Physical Therapy Session Note  Patient Details  Name: Howard White MRN: [id number] Date of Birth: [birth year]  Today's Date: 04/17/2021 PT Individual Time: 0800-0858 PT Individual Time Calculation (min): 58 min   Short Term Goals: Week 1:  PT Short Term Goal 1 (Week 1): Patient will roll B with no more than MaxA PT Short Term Goal 2 (Week 1): Patient will complete sit <> supine with no more than ModA PT Short Term Goal 3 (Week 1): Patient will complete sit <> stand with LRAD and +2 MaxA safely  Skilled Therapeutic Interventions/Progress Updates:   Patient received supine in bed, undressed, and consistently pulling at mattress and sheets. Pieces of his brief were found all over the bed and floor. Patient found to have been incontinent of bowel requiring MaxA to roll primarily to the L while 2nd assist completed bedlevel perihygiene. Sheets changed and brief/clothing donned with multiple bouts of rolling in bed. Pants donned with TotalA, though patient was able to actively flex L hip in attempt to thread foot though pant hole. R LE needing to be dependently threaded due to consistent an uncoordinated movements. Patient found to have redness to his L chest and L midabdomen, likely from his L hand due to his severe flexor tone and writhing movements. RN and MD aware. PT stuffing multiple pillows under L side of bedsheet to assist in patient maintaining neutral trunk alignment when in bed- preventing excessive L lateral trunk lean. Patient still with strong pushing off of R bedrail with R UE/LE contributing to L lateral trunk lean. RN present to admin AM rx. PT performing PROM to L LE/UE to help minimize effects of tone. Noted to have significantly less extensor tone in L LE when patient was subdued in bed, but as soon as attention was drawn to completing a motor task- strong extensor tone would resume. Same could be found in L UE flexor tone. Mother at bedside with multiple questions for PT regarding which  type of wc he will use upon d/c. PT emphasizing to mother that d/c is some time away so it's hard to determine what type of wc he would benefit from upon dc. Mother does note that the standard wc they received after last admission is too heavy for her to lift into the car by herself and husband is unable to assist much physically. Patient coming to sit edge of bed with TotalA and facilitation to break truncal extensor tone + L lateral lean. Upon sitting up, patient with strong pushing with R UE toward the L. Patient not responsive to multimodal cues in an attempt to break up pushing. Patient maintaining slightly improved head control head control, but severely cervical flexion with R lateral rotation persists. Patient able to feed himself 3-4 bites of breakfast with max cuing for safe swallow and decreased impulsivity of intake. Patient did take a very long time to swallow between bites as well. Patient returning supine in bed with TotalA x2, positioned in upright posture to allow NT to finish full supervision feeding.  Handoff care to NT.   Therapy Documentation Precautions:  Precautions Precautions: Fall Precaution Comments: severe L hemi tone, R hemi dyskinesia (?), Pushing L, aphasia Restrictions Weight Bearing Restrictions: No    Therapy/Group: Individual Therapy  Karoline Caldwell, PT, DPT, CBIS  04/17/2021, 7:40 AM

## 2021-04-17 NOTE — IPOC Note (Signed)
Overall Plan of Care [facility]) Patient Details Name: Howard White MRN: [id number] DOB: 1961-07-11  Admitting Diagnosis: Acute stroke of basal ganglia [facility])  Hospital Problems: Principal Problem:   Acute stroke of basal ganglia ([place])     Functional Problem List: Nursing    PT Balance,Behavior,Endurance,Motor,Nutrition,Pain,Perception,Safety,Sensory,Skin Integrity  OT Balance,Cognition,Pain,Perception,Motor,Sensory,Skin Integrity,Safety,Other (Comment) (tone/spasticity)  SLP Cognition,Motor,Linguistic,Safety  TR         Basic ADL's: OT Grooming,Bathing,Dressing,Toileting,Eating     Advanced  ADL's: OT       Transfers: PT Bed Mobility,Bed to Chair,Car,Furniture  OT Toilet,Tub/Shower     Locomotion: PT Ambulation,Wheelchair Mobility     Additional Impairments: OT Fuctional Use of Upper Extremity  SLP Swallowing,Communication comprehension,expression    TR      Anticipated Outcomes Item Anticipated Outcome  Self Feeding Supervision  Swallowing  Min A   Basic self-care  Mod to max assist  Toileting  max assist   Bathroom Transfers mod assist  Bowel/Bladder     Transfers  ModA  Locomotion  MaxA  Communication  Mod A  Cognition  Mod A  Pain     Safety/Judgment      Therapy Plan: PT Intensity: Minimum of 1-2 x/day ,45 to 90 minutes PT Frequency: 5 out of 7 days PT Duration Estimated Length of Stay: 3.5-4 weeks OT Intensity: Minimum of 1-2 x/day, 45 to 90 minutes OT Frequency: 5 out of 7 days OT Duration/Estimated Length of Stay: 3.4-4 weeks SLP Intensity: Minumum of 1-2 x/day, 30 to 90 minutes SLP Frequency: 3 to 5 out of 7 days SLP Duration/Estimated Length of Stay: 3.5-4 weeks   Due to the current state of emergency, patients may not be receiving their 3-hours of Medicare-mandated therapy.   Team Interventions: Nursing Interventions    PT interventions Ambulation/gait training,Discharge planning,Functional mobility training,Psychosocial  support,Therapeutic Activities,Visual/perceptual remediation/compensation,Balance/vestibular training,Disease management/prevention,Neuromuscular re-education,Skin care/wound management,Therapeutic Exercise,Wheelchair propulsion/positioning,Cognitive remediation/compensation,DME/adaptive equipment instruction,Pain management,Splinting/orthotics,UE/LE Strength taining/ROM,Community reintegration,Functional electrical stimulation,Patient/family education,Stair training,UE/LE Coordination activities  OT Interventions Balance/vestibular training,Cognitive remediation/compensation,DME/adaptive equipment instruction,Disease mangement/prevention,Discharge planning,Functional electrical stimulation,Pain management,Self Care/advanced ADL retraining,Therapeutic Activities,UE/LE Coordination activities,Patient/family education,Functional mobility training,Skin care/wound managment,Therapeutic Exercise,Visual/perceptual remediation/compensation,Neuromuscular re-education,Psychosocial support,Splinting/orthotics,UE/LE Strength taining/ROM,Wheelchair propulsion/positioning  SLP Interventions Cognitive remediation/compensation,Patient/family education,Speech/Language facilitation  TR Interventions    SW/CM Interventions Discharge Planning,Psychosocial Support,Patient/Family Education,Disease Management/Prevention   Barriers to Discharge MD  Medical stability  Nursing      PT Decreased caregiver support,Incontinence,Neurogenic Bowel & Bladder,Lack of/limited family support,Other (comments) tone, multiple CVAs  OT Decreased caregiver support Pt will likely need mod to max assist at discharge.  SLP      SW Decreased caregiver support,Lack of/limited family support,Insurance for SNF coverage,Incontinence     Team Discharge Planning: Destination: PT-Home ,OT- Home , SLP-Home Projected Follow-up: PT-Home health PT,Skilled nursing facility,24 hour supervision/assistance, OT-  24 hour supervision/assistance,Home  health OT, SLP-24 hour supervision/assistance,Home Health SLP,Outpatient SLP Projected Equipment Needs: PT-To be determined, OT- To be determined, SLP-None recommended by SLP Equipment Details: PT-owns RW, wc, OT-  Patient/family involved in discharge planning: PT- Patient,Family member/caregiver,  OT-Patient, SLP-Patient,Family member/caregiver  MD ELOS: 21-25d Medical Rehab Prognosis:  Fair Assessment:  60 year old male with history ofleft frontal craniotomy s/p astrocytoma resection, seizure disorder, CVA 11/21 with residual left hemiplegia who was admitted on 04/08/2021 with reports of multiple falls,increasing sleepiness with decrease in interaction and progressive weakness. CT head was negative for acute finding and showed advanced small vessel disease as well as remote left cerebral glioma resection. MRI brain done showing acute small vessel infarct involving right corona radiata and adjacent  basal ganglia. MRA was negative for occlusion or stenosis. He has had issues with tremors right upper extremity but no evidence of seizure per neurology. EEG done showing evidence of epileptogenicity and cortical dysfunction left frontocentral region c/w underlying craniotomy but no seizures seen during recording. Carotid Dopplers were negative for significant ICA stenosis. 2D echo showed EF of 60 to 65% no wall abnormality.  Dr. Leonie Man felt that stroke was secondary to small vessel disease and recommended DAPT x3 weeks followed by ASA alone. Speech therapy evaluations revealed cognitive motor speech deficits with decreased attention and reported to be worse since stroke. Therapy ongoing and patient noted to have balance deficits with decrease in motor coordination RUE/RLE, tendency to push to left and posteriorly as well as anxiety,restlessness and poor safety awareness. At baseline patient was wheelchair bound but was working on standing attempt with outpatient therapy.Family assist with ADL  tasks.Marland Kitchen He continues to be limited by left hemiplegia with spasticity and CIR was recommended to follow plan.    Now requiring 24/7 Rehab RN,MD, as well as CIR level PT, OT and SLP.  Treatment team will focus on ADLs and mobility with goals set at mod/Max A  See Team Conference Notes for weekly updates to the plan of care

## 2021-04-17 NOTE — Progress Notes (Signed)
PROGRESS NOTE   Subjective/Complaints:  Awake, pushing upper body to left all covers and clothing off, labs reviewed  ROS- did not respond to questions Objective:   DG CHEST PORT 1 VIEW  Result Date: 04/16/2021 CLINICAL DATA:  Leukocytosis, stroke. EXAM: PORTABLE CHEST 1 VIEW COMPARISON:  Multiple priors, most recent Apr 10, 2021. FINDINGS: Similar mild patient rotation. Low lung volumes with streaky left basilar opacities. No visible pleural effusions or pneumothorax on this single AP supine radiograph. Cardiomediastinal silhouette is similar. IMPRESSION: Streaky left basilar opacities, favored to reflect atelectasis given low lung volumes and similar appearance on the priors. Aspiration or pneumonia is not excluded. Electronically Signed   By: Margaretha Sheffield MD   On: 04/16/2021 08:33   Recent Labs    04/15/21 0504 04/16/21 0914  WBC 16.6* 11.5*  HGB 18.1* 16.9  HCT 51.1 49.2  PLT 365 322   Recent Labs    04/15/21 0504  NA 139  K 4.1  CL 99  CO2 22  GLUCOSE 113*  BUN 42*  CREATININE 1.47*  CALCIUM 10.3    Intake/Output Summary (Last 24 hours) at 04/17/2021 0738 Last data filed at 04/17/2021 0119 Gross per 24 hour  Intake 1920 ml  Output --  Net 1920 ml        Physical Exam: Vital Signs Blood pressure 104/66, pulse 68, temperature 98.2 F (36.8 C), resp. rate 18, height 5\' 10"  (1.778 m), weight 79.6 kg, SpO2 95 %.   General: No acute distress Mood and affect are appropriate Heart: Regular rate and rhythm no rubs murmurs or extra sounds Lungs: Clear to auscultation, breathing unlabored, no rales or wheezes Abdomen: Positive bowel sounds, soft nontender to palpation, nondistended Extremities: No clubbing, cyanosis, or edema Skin: No evidence of breakdown, no evidence of rash  Neurologic:Motor 0/5 LUE, trace hip/knee ext synergy LLE TOne  MAS 4 in Left biceps, wrist and finger flexors  Musculoskeletal:  reduced Left elbow ext due to tone, wrist and fingers in flexed position on L side    Assessment/Plan: 1. Functional deficits which require 3+ hours per day of interdisciplinary therapy in a comprehensive inpatient rehab setting.  Physiatrist is providing close team supervision and 24 hour management of active medical problems listed below.  Physiatrist and rehab team continue to assess barriers to discharge/monitor patient progress toward functional and medical goals  Care Tool:  Bathing    Body parts bathed by patient: Chest,Abdomen,Face,Front perineal area   Body parts bathed by helper: Chest,Abdomen,Face,Right upper leg,Left upper leg Body parts n/a: Left lower leg,Right lower leg,Right arm,Left arm,Front perineal area,Buttocks   Bathing assist Assist Level: 2 Helpers     Upper Body Dressing/Undressing Upper body dressing   What is the patient wearing?: Pull over shirt    Upper body assist Assist Level: Maximal Assistance - Patient 25 - 49%    Lower Body Dressing/Undressing Lower body dressing      What is the patient wearing?: Incontinence brief     Lower body assist Assist for lower body dressing: 2 Helpers     Toileting Toileting    Toileting assist Assist for toileting: 2 Helpers     Transfers Chair/bed transfer  Transfers assist  Chair/bed transfer activity did not occur: Safety/medical concerns        Locomotion Ambulation   Ambulation assist   Ambulation activity did not occur: Safety/medical concerns          Walk 10 feet activity   Assist  Walk 10 feet activity did not occur: Safety/medical concerns        Walk 50 feet activity   Assist Walk 50 feet with 2 turns activity did not occur: Safety/medical concerns         Walk 150 feet activity   Assist Walk 150 feet activity did not occur: Safety/medical concerns         Walk 10 feet on uneven surface  activity   Assist Walk 10 feet on uneven surfaces activity  did not occur: Safety/medical concerns         Wheelchair     Assist Will patient use wheelchair at discharge?: Yes Type of Wheelchair: Manual Wheelchair activity did not occur: Safety/medical concerns         Wheelchair 50 feet with 2 turns activity    Assist    Wheelchair 50 feet with 2 turns activity did not occur: Safety/medical concerns       Wheelchair 150 feet activity     Assist  Wheelchair 150 feet activity did not occur: Safety/medical concerns       Blood pressure 104/66, pulse 68, temperature 98.2 F (36.8 C), resp. rate 18, height 5\' 10"  (1.778 m), weight 79.6 kg, SpO2 95 %.    Medical Problem List and Plan: 1.L hemiplegia worsening due to recurrent R CR/BG infarct, has associated spasticity and contracture formation- spasticity has worsened significantly with new infarct , cognitive status declined Cont CIR PT, OT, SLP, speech dysarthric hypophonic and delayed Has features of cortical lesion although scan showing subcortical involvement will discuss with neuro  -ELOS/Goals:min A 17-20 days 2. Antithrombotics: -DVT/anticoagulation:Pharmaceutical:Lovenox -antiplatelet therapy: DAPT x3 weeks followed by ASA alone. 3. Pain Management:Tylenol as needed 4. Mood:LCSW to follow for evaluation and support -antipsychotic agents: N/AA 5. Neuropsych: This patientis not fullycapable of making decisions on hisown behalf. 6. Skin/Wound Care:Routine pressure-relief measures. 7. Fluids/Electrolytes/Nutrition:Monitor intake/output. Check CMET in am.  8.History of seizure disorder: Continue Lamictal twice daily 9.Left spastic hemiparesis: Continue baclofen 5 mg 3 times daily-->titrated upwards? 10.History of anxiety disorder: Continue Lexapro. Hydroxyzine 3 times daily. 11.Insomnia: Continue melatonin at bedtime. 12.Hypothyroid: Stable on supplement. 13. Spasticity/forming contractures-  this has significantly worsened since new CVA, increas baclofen to 10mg  TID, monitor sedation may need Botox as inpt 14. Leukocytosis without fever- improved from 16K to 11K,   Negative UA , recheck UA, unremarkable  CXR  no hypoxia or resp distress- will recheck Monday  LOS: 3 days A FACE TO [place] Avey [place]manamon 04/17/2021, 7:38 AM

## 2021-04-17 NOTE — Progress Notes (Addendum)
Occupational Therapy Session Note  Patient Details  Name: Howard White MRN: [id number] Date of Birth: 1961/05/06  Today's Date: 04/17/2021 OT Individual Time: 0900-0930 OT Individual Time Calculation (min): 30 min    Short Term Goals: Week 1:  OT Short Term Goal 1 (Week 1): Pt will complete static sitting balance EOB with min assist in preparation for selfcare tasks. OT Short Term Goal 2 (Week 1): Pt will complete rolling in the bed with max assist to the right and mod assist to the left to assist with toileting tasks and LB clothing management. OT Short Term Goal 3 (Week 1): Pt will tolerate hand splint for positioning of the LUE into slight extension. OT Short Term Goal 4 (Week 1): Pt will complete UB bathing sitting EOB with no more than mod assist and mod instructional cueing for  Skilled Therapeutic Interventions/Progress Updates:    Pt supine in bed, awake however nonverbal when OT asked pt yes/no questions.  Pt did not appear in pain/discomfort throughout session.  Assessed tone in LUE finding 3 on Ashworth Scale- flexor tone in shoulder, elbow, forearm, wrist, and digits.  After prolonged stretch, pt able to achieve full extension of elbow in supinated position, and composite wrist/digits to 0 degrees.  Provision of soft tissue longitudinal massage over biceps and brachioradialis with simultaneous moist heat application with elbow placed in supported extended position to increase soft tissue extensibility.  Assessed current prefabricated resting hand splint donning on left wrist and hand.  Noted current splint does not provide enough support at wrist to facilitate maximum stretch/neutral positioning. Call bell in reach, bed alarm on, LUE supported with pillows.  Pt could benefit from custom resting hand splint left wrist and hand and possible long arm extension splint left elbow to promote better functional positioning to prevent skin breakdown and joint contractures.    Therapy  Documentation Precautions:  Precautions Precautions: Fall Precaution Comments: severe L hemi tone, R hemi dyskinesia (?), Pushing L, aphasia Restrictions Weight Bearing Restrictions: No   Therapy/Group: Individual Therapy  Ezekiel Slocumb 04/17/2021, 12:21 PM

## 2021-04-18 DIAGNOSIS — G9340 Encephalopathy, unspecified: Secondary | ICD-10-CM

## 2021-04-18 DIAGNOSIS — R451 Restlessness and agitation: Secondary | ICD-10-CM

## 2021-04-18 DIAGNOSIS — I6930 Unspecified sequelae of cerebral infarction: Secondary | ICD-10-CM

## 2021-04-18 DIAGNOSIS — R410 Disorientation, unspecified: Secondary | ICD-10-CM

## 2021-04-18 DIAGNOSIS — R7989 Other specified abnormal findings of blood chemistry: Secondary | ICD-10-CM

## 2021-04-18 DIAGNOSIS — N179 Acute kidney failure, unspecified: Secondary | ICD-10-CM

## 2021-04-18 LAB — BASIC METABOLIC PANEL
Anion gap: 7 (ref 5–15)
BUN: 55 mg/dL — ABNORMAL HIGH (ref 6–20)
CO2: 27 mmol/L (ref 22–32)
Calcium: 9.6 mg/dL (ref 8.9–10.3)
Chloride: 108 mmol/L (ref 98–111)
Creatinine, Ser: 1.03 mg/dL (ref 0.61–1.24)
GFR, Estimated: >60 mL/min (ref >60)
Glucose, Bld: 100 mg/dL — ABNORMAL HIGH (ref 70–99)
Potassium: 4.2 mmol/L (ref 3.5–5.1)
Sodium: 142 mmol/L (ref 135–145)

## 2021-04-18 LAB — CBC
HCT: 46.5 % (ref 39.0–52.0)
Hemoglobin: 15.8 g/dL (ref 13.0–17.0)
MCH: 31.7 pg (ref 26.0–34.0)
MCHC: 34 g/dL (ref 30.0–36.0)
MCV: 93.2 fL (ref 80.0–100.0)
Platelets: 294 10*3/uL (ref 150–400)
RBC: 4.99 MIL/uL (ref 4.22–5.81)
RDW: 12.2 % (ref 11.5–15.5)
WBC: 9.1 10*3/uL (ref 4.0–10.5)
nRBC: 0 % (ref 0.0–0.2)

## 2021-04-18 LAB — URINE CULTURE: Culture: 20000 — AB

## 2021-04-18 MED ORDER — ESCITALOPRAM OXALATE 10 MG PO TABS
20.0000 mg | ORAL_TABLET | Freq: Every day | ORAL | Status: DC
  Administered 2021-04-19 – 2021-05-09 (×21): 20 mg via ORAL
  Filled 2021-04-18 (×21): qty 2

## 2021-04-18 MED ORDER — QUETIAPINE FUMARATE 25 MG PO TABS
25.0000 mg | ORAL_TABLET | Freq: Every day | ORAL | Status: DC
  Administered 2021-04-19 – 2021-05-09 (×21): 25 mg via ORAL
  Filled 2021-04-18 (×21): qty 1

## 2021-04-18 MED ORDER — QUETIAPINE FUMARATE 50 MG PO TABS
50.0000 mg | ORAL_TABLET | Freq: Every day | ORAL | Status: DC
  Administered 2021-04-18 – 2021-05-08 (×20): 50 mg via ORAL
  Filled 2021-04-18 (×2): qty 1
  Filled 2021-04-18: qty 2
  Filled 2021-04-18 (×13): qty 1
  Filled 2021-04-18 (×4): qty 2
  Filled 2021-04-18: qty 1

## 2021-04-18 NOTE — Progress Notes (Signed)
Physical Therapy Session Note  Patient Details  Name: Howard White MRN: [id number] Date of Birth: 1960/12/02  Today's Date: 04/18/2021 PT Individual Time: 4098-1191 and 4782-9562 PT Individual Time Calculation (min): 53 min and 38 min   Short Term Goals: Week 1:  PT Short Term Goal 1 (Week 1): Patient will roll B with no more than MaxA PT Short Term Goal 2 (Week 1): Patient will complete sit <> supine with no more than ModA PT Short Term Goal 3 (Week 1): Patient will complete sit <> stand with LRAD and +2 MaxA safely  Skilled Therapeutic Interventions/Progress Updates:    Session 1: Pt received supine in bed, asleep with Neurology NP in to assess patient. Discussed that pt has extreme fluctuations in alertness either awake and very restless with writhing movements or is so lethargic that unable to awaken despite +2 total assist to sit EOB but explained this is not patient's baseline from previous CIR stay but that the only similarities is that pt was somewhat impulsive and restless during last stay but not to this degree.   Pt appears agreeable to therapy session. With max verbal and tactile stimulus able to awaken for brief moments throughout session with 1x able to verbalize his name and correct location - otherwise pt asleep with eyes closed and lethargic. Pt wearing L hand palm guard. Pt noted to be incontinent of bladder and bowels. Rolling L with pt able to initiate this task using R UE and R LE requiring max assist to complete and then rolling R with total assist during total assist LB clothing management and peri-care. Therapist noted redness on buttocks therefore applied barrier cream and notified RN. Notified RN to place a foam dressing on pt's L chest to protect from L hand scratching during his writing/restless moments. In supine, donned shorts total assist with pt able to intiaite lifting R>L LEs showing understanding of simple 1-step commands - pt able to place R LE in hooklying  position and with max manual facilitation able to flex L LE into that position and with max assist pt able to minimally bridge and lift hips with total assist to pull pants up. Supine>sitting R EOB, +2 total assist, pt with poor motor planning but attempts to assist with this task via R UE and R LE movements. Sitting EOB ~29minutes during session with total assist of 1 to prevent posterior LOB and pt intermittently demonstrating pushing with R UE requiring cuing/manual facilitation to keep R UE abducted to decrease pushing tendencies - pt with varying levels of alertness while sitting EOB. Sitting EOB pt continues to demo excessive thoracic flexion with extreme cervical flexion causing downward gaze, intermittently able to have pt initiate trunk/cervical extension but unable to sustain for >10seconds. Therapist washed pt's L hand while performing gentle wrist and finger extension stretch to neutral for tone management. Pt gestured towards his mouth therefore provided water and pt tried to take sips, but it would fall out of his mouth, noticed pt had bitten L side of mouth at some point, RN notified. Donned shirt with max assist - pt able to thread R UE and assist with pulling shirt over head. Returned to R sidelying in bed +2 total assist for trunk descent and B LE management. Pt quickly falling asleep in R sidelying - therapist positioned washcloth roll in L hand to promote improved finger positioning due to flexor tone and placed pillows under the sheet around patient to protect him in event he has onset of restlessness.  Pt left with needs in reach, bed alarm on, and telesitter in place.    Session 2: Pt received sitting in TIS w/c at nurses station listening to music. Pt appears agreeable to therapy session - continues to have expressive>receptive aphasia with mumbled speech but intermittently able to state 1-2 words with intelligibility. Continues to demo ability to follow simple 1-step motor commands.  Transported back to room. R squat pivot w/c>EOB with max assist of 1 and +2 mod assist - therapist blocking L LE extensor tone to ensure foot wont slide out from under him, required 3 small scoots to get fully to EOB, pt able to sustain anterior trunk flexion/weight shift with manual facilitation. Once sitting EOB requires max assist for trunk control due to posterior lean - continued to fluctuate throughout session requiring up to total assist with fatigue along with intermittent pushing with R UE towards the L. Sit<>stand to/from EOB 3x during session - cuing for pt to push up with R UE from bed (not bedrail to avoid onset of pushing) - in standing, R UE around +2 assist's shoulders - +2 mod to total assist to come to stand - once in standing, able to progress to +2 mod assist to maintain upright posture as pt has same excessive trunk/head flexed posturing that he has in sitting along with L lean - blocking L knee throughout with pt demoing moderate quad activation. On 1st stand, able to progress to pre-gait training via R LE forward/backwards stepping with +2 mod/max assist for balance and therapist blocking L knee - on 2nd & 3rd standing trials pt too fatigued to progress to this and is requiring more assist to maintain static upright, standing. Sit>supine with +2 total assist for trunk descent and B LE management onto bed. Pt noted to be incontinent of bowels. Rolling R with min/mod assist when given cues and time to motor plan, rolling R with mod/max assist and thearpist facilitating L LE into hooklying position. Total assist peri-care - therapist noted buttocks redness decreased since AM and re-applied barrier cream. While in supine, pt appears very restless and appears to be attempting to roll onto R side - max assist provided for rolling and repositioning on that side with pt calming down and starting to fall asleep. Pt left R sidelying in bed with music playing for comfort, pillows placed under sheets for  padding to protect patient, pillow between his knees for improved LE alignment, lines intact, bed alarm on, telesitter in place, and floor mats down. Notified RN that if pt noted to be lying supine in bed and appears restless that it may be because he wants assistance repositioning in sidelying.  Therapy Documentation Precautions:  Precautions Precautions: Fall Precaution Comments: severe L hemi tone, R hemi dyskinesia (?), Pushing L, aphasia Restrictions Weight Bearing Restrictions: No  Pain:   Session 1: No indications of pain during session.  Session 2: No indications of pain during session.   Therapy/Group: Individual Therapy  Tawana Scale , PT, DPT, CSRS  04/18/2021, 7:55 AM

## 2021-04-18 NOTE — Progress Notes (Signed)
Occupational Therapy Session Note  Patient Details  Name: Howard White MRN: [id number] Date of Birth: [birth year]  Today's Date: 04/18/2021 OT Individual Time: 6333-5456 and 1345-1445 OT Individual Time Calculation (min): 30 min and 60 min    Short Term Goals: Week 1:  OT Short Term Goal 1 (Week 1): Pt will complete static sitting balance EOB with min assist in preparation for selfcare tasks. OT Short Term Goal 2 (Week 1): Pt will complete rolling in the bed with max assist to the right and mod assist to the left to assist with toileting tasks and LB clothing management. OT Short Term Goal 3 (Week 1): Pt will tolerate hand splint for positioning of the LUE into slight extension. OT Short Term Goal 4 (Week 1): Pt will complete UB bathing sitting EOB with no more than mod assist and mod instructional cueing for  Skilled Therapeutic Interventions/Progress Updates:    Visit 1: no expression of pain, but during stretching of LUE asked pt if the movement felt good or was uncomfortable and he answered "uncomfortable"  Pt received in bed with mom in room.  Focused on motor relaxation first with bent knee torso twists with min A, then UB torso twists with pt's R arm supporting L with max A, then after sitting to EOB with max A - seated torso twists with mod A. Pt able to sit statically with min A from 2nd helper as this therapist worked on PROM of LUE intergrating pt breathing in and then out as I went into a deeper stretch.  Pt tolerated well despite saying it was uncomfortable.  Worked on body on arm stretches with rotation.  Then with +2 for support but actually only min A, pt rose to stand with feet at wide stand for increased LLE wt bearing.  Pt initally hunched over, but had mom stand in front of him and cued him to stand tall to look at her. He was able to come into better trunk extension. Tolerated standing for close to 2-3 minutes.  Moved back to supine and adjusted pt in bed in chair position.  No  pillow to have pt work on neck extension. Donned resting hand splint and used pillows to support L arm. Washed out his palmar splint and placed to dry.  Bed alarm set and all needs met.   Visit 2: Pain: no expressions of pain  Pt's L resting hand splint still on in good position.   Pt received in bed tugging on his shorts.  His brief was soiled with BM and urine.  Pt did very well bridging his hips with LLE supported and using his R hand to help pull down his shorts. Rolled L and R several times with mod A for brief removal, cleansing and donning new brief.  After therapist assisted pt with donning shorts over feet, he actively bridged and then pulled shorts most of the way over his hips.  +2 A with session from rehab tech, pt rolled to R with min A and sat to EOB with max A as he helped push up to sit with RUE. Sat EOB with min A for static sit.   +2 A to complete squat pivot to his R in 3 interval steps to scooting small amounts 3 x to wc.  Cued pt to lean forward and push up through his legs as if he wanted to stand and then therapist and tech guided his hips. Pt able to transfer with max A.   Once in  wc worked on head/neck positioning for upright posture with pt attending to cues and trying to help move his head into more upright position.  Pt taken to gym to hi low table and arms place on towel on table. Played classic rock music and pt responded well tapping his R fingers and toes to the beat.  With Hand over hand A, towel slides on table for gentle  Self (with A) ROM focusing on scapular mobility. Able to achieve full elbow extension but at times spasticity would kick back in and pt's arm would pull back into tight flexion.  Able to work on opening thumb and palm for full hand ROM.  Because palmar splint still wet, used wash cloth roll to protect palm.  Pt very calm and cooperative so place him in TIS wc at nursing station for supervision.      Therapy Documentation Precautions:   Precautions Precautions: Fall Precaution Comments: severe L hemi tone, R hemi dyskinesia (?), Pushing L, aphasia Restrictions Weight Bearing Restrictions: No    Vital Signs: Therapy Vitals Temp: 97.8 F (36.6 C) Pulse Rate: 77 Resp: 20 BP: 109/63 Patient Position (if appropriate): Lying Oxygen Therapy SpO2: 93 % O2 Device: Room Air    ADL: ADL Eating: Maximal assistance Where Assessed-Eating: Bed level Grooming: Maximal assistance Where Assessed-Grooming: Bed level Upper Body Bathing: Maximal assistance Where Assessed-Upper Body Bathing: Edge of bed,Bed level Lower Body Bathing: Other (comment) (total +2) Where Assessed-Lower Body Bathing: Edge of bed,Bed level Upper Body Dressing: Maximal assistance Where Assessed-Upper Body Dressing: Edge of bed Lower Body Dressing: Other (Comment) (total +2) Where Assessed-Lower Body Dressing: Bed level Toileting: Other (Comment) (total assist) Where Assessed-Toileting: Bed level Toilet Transfer Method: Not assessed Tub/Shower Transfer: Not assessed Gaffer Transfer: Not assessed   Therapy/Group: Individual Therapy  [facility] 04/18/2021, 1:24 PM

## 2021-04-18 NOTE — Progress Notes (Signed)
Speech Language Pathology Daily Session Note  Patient Details  Name: Howard White MRN: [id number] Date of Birth: [birth year]  Today's Date: 04/18/2021 SLP Individual Time: 1300-1320 SLP Individual Time Calculation (min): 20 min  Short Term Goals: Week 1: SLP Short Term Goal 1 (Week 1): Pt will demonstrate sustained attention to functional tasks for 4-5 minutes with min A verbal cues. SLP Short Term Goal 2 (Week 1): Pt will respond to functional yes/no questions verbally or with gestures with 80% accuracy mod A visual cues. SLP Short Term Goal 3 (Week 1): Pt will tolerate dys 3 diet and thin liquids with no overt s/sx of aspiration or penetration on 9/10 trials. SLP Short Term Goal 4 (Week 1): Pt will follow basic one step commands in 60% of opportunities with mod A verbal cues. SLP Short Term Goal 5 (Week 1): Pt will utilize an increased vocal intensity at the word level to achieve 50% intelligibility with max a visual and verbal cues.  Skilled Therapeutic Interventions:   Patient seen at request of RN secondary to ongoing concern regarding his ability to consume PO's as well as concerns for patient pocketing/holding PO's and exhibiting some coughing. SLP saw patient with his mother present in room. SLP examined patient's oral cavity and did not observe any PO residuals or secretions. Patient was alert but appeared lethargic, eyes closing frequently. SLP discussed with mother concerns and how ST has had to downgrade his diet from regular solids, thin liquids to puree solids. Mother is hopeful that medication changes will help patient to be more alert and return to baseline from when on acute care. If patient does not improve with his ability to take PO's, likely will need alternative nutrition. SLP is also anticipating that if this is necessary, patient would need longer term (PEG) as opposed to shorter term (Cortrak).   Pain Pain Assessment Pain Scale: Faces Faces Pain Scale: No  hurt  Therapy/Group: Individual Therapy  Sonia Baller, MA, CCC-SLP Speech Therapy

## 2021-04-18 NOTE — Progress Notes (Signed)
Neurology Progress Note  Reason for Consult: restless Interval History: Pt is unable to give any information. Currently he is calm and cooperative. Upon d//w rehab staff at bedside who have worked with him previously, it seems he is now at baseline, which can greatly wax/wane with agitation and lethargy since admission to [facility] after stroke late May. Staff also point out that last admit he had bouts of delirium as well.   Review of Systems: A full ROS was attempted today and was not able to be performed d/t aphasia.  Physical Examination: Temp:  [97.6 F (36.4 C)-98.6 F (37 C)] 97.7 F (36.5 C) (06/04 0333) Pulse Rate:  [63-103] 63 (06/04 0333) Resp:  [16-19] 19 (06/04 0333) BP: (101-146)/(72-110) 101/73 (06/04 0333) SpO2:  [95 %-97 %] 95 % (06/04 0333)  General - well nourished, well developed, in no apparent distress, no restlessness currently Cardiovascular - regular rhythm and rate  Neuro - awake, alert, eyes open, severe dysarthria and hypophonia, but able to answer orientation questions in low voice, orientated to age, place, but not to time. Limited language output but following all simple commands, able to name 2/3 and able to repeat 3-5 word sentences, however again hypophonic. No gaze palsy but bilateral gaze incomplete, tracking bilaterally, blinking to visual threat bilaterally, PERRL. Left facial droop. Tongue protrusion difficult to perform. Right UEs 5/5, no drift, FTN grossly intact but slow, however, restless but can be distracted and directed. LUE flexion contracture. RLE 5/5, no drift, LLE 4/5 no drift. Sensation subjectively symmetrical but not reliable per observation, gait not tested.   Data Reviewed: CT HEAD WO CONTRAST  Result Date: 04/10/2021 CLINICAL DATA:  Stroke follow-up.  Possible worsening balance. EXAM: CT HEAD WITHOUT CONTRAST TECHNIQUE: Contiguous axial images were obtained from the base of the skull through the vertex without intravenous contrast.  COMPARISON:  Apr 08, 2021. FINDINGS: Motion degraded exam.  Within this limitation: Brain: No substantial change in edema associated with the recent right corona radiata/basal ganglia infarct seen on May 25th MRI. No evidence of interval acute large vascular territory infarct. No acute hemorrhage or progressive mass effect. Similar appearance of the left frontal CSF density resection cavity which bibasilar communicates with the left lateral ventricle, better characterized on recent MRI. No progressive ventriculomegaly. Similar patchy white matter hypodensities, which are nonspecific. Vascular: Calcific atherosclerosis without evidence of a hyperdense vessel. Skull: No acute fracture.  Prior left frontal craniotomy. Sinuses/Orbits: Visualized sinuses are largely clear. Small retention cyst in the right sphenoid sinus. Unremarkable orbits on limited assessment. Other: No mastoid effusions. IMPRESSION: 1. No substantial change in edema associated with the recent right corona radiata/basal ganglia infarct seen on May 25th MRI. No acute hemorrhage or progressive mass effect. 2. No evidence of interval/acute intracranial abnormality on this motion degraded exam. MRI could provide more sensitive evaluation for acute infarct if clinically indicated. Electronically Signed   By: Margaretha Sheffield MD   On: 04/10/2021 15:49   CT Head Wo Contrast  Result Date: 04/08/2021 CLINICAL DATA:  Weakness.  Falls. EXAM: CT HEAD WITHOUT CONTRAST TECHNIQUE: Contiguous axial images were obtained from the base of the skull through the vertex without intravenous contrast. COMPARISON:  Brain MRI 09/26/2020 FINDINGS: Brain: Cystic density in the left frontal region correlating with remote history of astrocytoma resection. Confluent chronic small vessel ischemia in the cerebral white matter with perforator infarct at the right basal ganglia on a chronic basis. Suspect accelerated disease from radiotherapy. No acute hemorrhage, hydrocephalus,  or worrisome mass  effect Vascular: Atheromatous calcification Skull: Diffuse heterogeneity of the skull, likely radiation effects. Unremarkable remote left frontal craniotomy. Sinuses/Orbits: Negative IMPRESSION: 1. No acute finding. 2. Remote left cerebral glioma resection. 3. 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 Electronically Signed   By: Monte Fantasia M.D.   On: 04/08/2021 10:50   MR ANGIO HEAD WO CONTRAST  Result Date: 04/09/2021 CLINICAL DATA:  Acute right basal ganglia/corona radiata infarct on MRI. EXAM: MRA HEAD WITHOUT CONTRAST TECHNIQUE: Angiographic images of the Circle of Willis were acquired using MRA technique without intravenous contrast. COMPARISON:  Head and neck CTA 09/26/2020 FINDINGS: Anterior circulation: The internal carotid arteries are widely patent from skull base to carotid termini. ACAs and MCAs are patent without evidence of a proximal branch occlusion or significant proximal stenosis. No aneurysm identified. Posterior circulation: The visualized distal vertebral arteries are widely patent to the basilar with the right being dominant. The left PICA and right AICA appear dominant. Patent SCAs are seen bilaterally. The basilar artery is widely patent. There is a fetal origin of the left PCA. Both PCAs are patent with branch vessel atherosclerosis including a moderate to severe proximal left P3 stenosis as seen on the prior CTA, however there is no significant P1 or proximal P2 stenosis. No aneurysm is identified. Anatomic variants: Fetal left PCA. IMPRESSION: No major intracranial branch occlusion or flow limiting proximal stenosis. Electronically Signed   By: Logan Bores M.D.   On: 04/09/2021 16:21   MR ANGIO NECK WO CONTRAST  Result Date: 04/09/2021 CLINICAL DATA:  Acute right basal ganglia/corona radiata infarct on MRI. EXAM: MRA NECK WITHOUT CONTRAST TECHNIQUE: Angiographic images of the neck were acquired using MRA technique without  intravenous contrast. Carotid stenosis measurements (when applicable) are obtained utilizing NASCET criteria, using the distal internal carotid diameter as the denominator. COMPARISON:  Head and neck CTA 09/26/2020 FINDINGS: Aortic arch: The aortic arch was not included on this noncontrast examination. The included portions of the brachiocephalic and subclavian arteries are widely patent. Right carotid system: Patent without evidence of stenosis or dissection. Left carotid system: Patent without evidence of stenosis or dissection. Vertebral arteries: Patent with antegrade flow bilaterally and with the right vertebral artery being dominant. No evidence of a significant stenosis or dissection. IMPRESSION: Negative neck MRA. Electronically Signed   By: Logan Bores M.D.   On: 04/09/2021 16:31   MR BRAIN WO CONTRAST  Result Date: 04/08/2021 CLINICAL DATA:  Following EXAM: MRI HEAD WITHOUT CONTRAST TECHNIQUE: Multiplanar, multiecho pulse sequences of the brain and surrounding structures were obtained without intravenous contrast. COMPARISON:  09/26/2020 FINDINGS: Motion artifact is present.  SWI was not obtained. Brain: There is restricted diffusion involving the right corona radiata extending from body of caudate toward the lentiform nucleus. This is anterior to a similarly oriented chronic infarct. There is a CSF intensity resection cavity of the left frontal lobe abutting or communicating with the left lateral ventricle. Ventricles and sulci are stable in size and configuration. No mass effect. Confluent areas of T2 hyperintensity in the supratentorial white matter are nonspecific and may reflect chronic microvascular ischemic changes and/or therapy related changes. Vascular: Major vessel flow voids at the skull base are preserved. Skull and upper cervical spine: Normal marrow signal is grossly preserved. Sinuses/Orbits: Paranasal sinuses are aerated. Orbits are unremarkable. Other: Sella is unremarkable.  Mastoid  air cells are clear. IMPRESSION: Motion degraded study. Acute small vessel infarct involving right corona radiata and adjacent basal ganglia. Chronic findings detailed above. Electronically Signed  By: Macy Mis M.D.   On: 04/08/2021 12:42   DG CHEST PORT 1 VIEW  Result Date: 04/16/2021 CLINICAL DATA:  Leukocytosis, stroke. EXAM: PORTABLE CHEST 1 VIEW COMPARISON:  Multiple priors, most recent Apr 10, 2021. FINDINGS: Similar mild patient rotation. Low lung volumes with streaky left basilar opacities. No visible pleural effusions or pneumothorax on this single AP supine radiograph. Cardiomediastinal silhouette is similar. IMPRESSION: Streaky left basilar opacities, favored to reflect atelectasis given low lung volumes and similar appearance on the priors. Aspiration or pneumonia is not excluded. Electronically Signed   By: Margaretha Sheffield MD   On: 04/16/2021 08:33   DG CHEST PORT 1 VIEW  Result Date: 04/10/2021 CLINICAL DATA:  60 year old with cough. EXAM: PORTABLE CHEST 1 VIEW COMPARISON:  11/04/2020 FINDINGS: Lungs are clear. Heart size is within normal limits and stable. Patient is mildly rotated towards the left. Bone structures are unremarkable. Negative for a pneumothorax. IMPRESSION: No active disease. Electronically Signed   By: Markus Daft M.D.   On: 04/10/2021 14:26   EEG adult  Result Date: 04/10/2021 Lora Havens, MD     04/10/2021 10:17 AM Patient Name: Howard White MRN: [id number] Epilepsy Attending: Lora Havens Referring Physician/Provider: Tollie Eth, NP Date: 04/09/2021 Duration: Patient history: 61 year old male with left frontal craniotomy about 20 years ago, history of seizures who presented after multiple falls and excessive drowsiness.  EEG to evaluate for seizures. Level of alertness: Awake,asleep AEDs during EEG study: LTG Technical aspects: This EEG study was done with scalp electrodes positioned according to the 10-20 International system of electrode  placement. Electrical activity was acquired at a sampling rate of 500Hz  and reviewed with a high frequency filter of 70Hz  and a low frequency filter of 1Hz . EEG data were recorded continuously and digitally stored. Description: The posterior dominant rhythm consists of 8 Hz activity of moderate voltage (25-35 uV) seen predominantly in posterior head regions, symmetric and reactive to eye opening and eye closing.  Sleep was characterized by vertex waves, sleep spindles (12 to 14 Hz), maximal frontocentral region.  EEG showed continuous generalized sharply contoured 3 to 5 Hz theta-delta slowing with overriding 15 to 18 Hz beta activity admixed with left frontocentral spikes, maximal F3/C3 consistent with underlying breach artifact.  Hyperventilation and photic stimulation were not performed.   ABNORMALITY -Spike, left frontocentral region -Breach artifact, left frontocentral region IMPRESSION: This study showed evidence of epileptogenicity and cortical dysfunction in left frontocentral region consistent with underlying craniotomy.  No seizures were seen throughout the recording. Lora Havens   ECHOCARDIOGRAM COMPLETE  Result Date: 04/08/2021    ECHOCARDIOGRAM REPORT   Patient Name:   TANNAR BROKER Date of Exam: 04/08/2021 Medical Rec #:  [id number]    Height:       70.0 in Accession #:    [id number]   Weight:       188.0 lb Date of Birth:  May 02, 1961   BSA:          2.033 m Patient Age:    80 years     BP:           137/79 mmHg Patient Gender: M            HR:           74 bpm. Exam Location:  Inpatient Procedure: 2D Echo, Cardiac Doppler and Color Doppler Indications:    CVA  History:        Patient has prior history of Echocardiogram examinations, most  recent 09/27/2020. Stroke. Brain cancer.  Sonographer:    Dustin Flock Referring Phys: [id number] Leslee Home  Sonographer Comments: No subcostal window. IMPRESSIONS  1. Left ventricular ejection fraction, by estimation, is 60 to 65%. The  left ventricle has normal function. The left ventricle has no regional wall motion abnormalities. Left ventricular diastolic parameters are consistent with Grade I diastolic dysfunction (impaired relaxation).  2. Right ventricular systolic function is normal. The right ventricular size is normal.  3. The mitral valve is normal in structure. Trivial mitral valve regurgitation. No evidence of mitral stenosis.  4. The aortic valve is normal in structure. Aortic valve regurgitation is not visualized. No aortic stenosis is present.  5. The inferior vena cava is normal in size with greater than 50% respiratory variability, suggesting right atrial pressure of 3 mmHg. FINDINGS  Left Ventricle: Left ventricular ejection fraction, by estimation, is 60 to 65%. The left ventricle has normal function. The left ventricle has no regional wall motion abnormalities. The left ventricular internal cavity size was normal in size. There is  no left ventricular hypertrophy. Left ventricular diastolic parameters are consistent with Grade I diastolic dysfunction (impaired relaxation). Normal left ventricular filling pressure. Right Ventricle: The right ventricular size is normal. No increase in right ventricular wall thickness. Right ventricular systolic function is normal. Left Atrium: Left atrial size was normal in size. Right Atrium: Right atrial size was normal in size. Pericardium: There is no evidence of pericardial effusion. Mitral Valve: The mitral valve is normal in structure. Trivial mitral valve regurgitation. No evidence of mitral valve stenosis. Tricuspid Valve: The tricuspid valve is normal in structure. Tricuspid valve regurgitation is not demonstrated. No evidence of tricuspid stenosis. Aortic Valve: The aortic valve is normal in structure. Aortic valve regurgitation is not visualized. No aortic stenosis is present. Pulmonic Valve: The pulmonic valve was normal in structure. Pulmonic valve regurgitation is not visualized.  No evidence of pulmonic stenosis. Aorta: The aortic root is normal in size and structure. Venous: The inferior vena cava is normal in size with greater than 50% respiratory variability, suggesting right atrial pressure of 3 mmHg. IAS/Shunts: No atrial level shunt detected by color flow Doppler.  LEFT VENTRICLE PLAX 2D LVIDd:         4.60 cm  Diastology LVIDs:         3.00 cm  LV e' medial:    6.85 cm/s LV PW:         1.10 cm  LV E/e' medial:  7.6 LV IVS:        0.90 cm  LV e' lateral:   8.92 cm/s LVOT diam:     2.30 cm  LV E/e' lateral: 5.8 LV SV:         81 LV SV Index:   40 LVOT Area:     4.15 cm  RIGHT VENTRICLE RV Basal diam:  2.90 cm RV S prime:     14.00 cm/s TAPSE (M-mode): 1.9 cm LEFT ATRIUM             Index       RIGHT ATRIUM           Index LA diam:        3.30 cm 1.62 cm/m  RA Area:     14.30 cm LA Vol (A2C):   37.2 ml 18.30 ml/m RA Volume:   33.20 ml  16.33 ml/m LA Vol (A4C):   34.6 ml 17.02 ml/m LA Biplane Vol: 36.3 ml 17.85 ml/m  AORTIC VALVE LVOT Vmax:   102.00 cm/s LVOT Vmean:  62.500 cm/s LVOT VTI:    0.195 m  AORTA Ao Root diam: 3.30 cm MITRAL VALVE MV Area (PHT): 4.89 cm    SHUNTS MV Decel Time: 155 msec    Systemic VTI:  0.20 m MV E velocity: 51.80 cm/s  Systemic Diam: 2.30 cm MV A velocity: 60.40 cm/s MV E/A ratio:  0.86 Mihai Croitoru MD Electronically signed by Sanda Klein MD Signature Date/Time: 04/08/2021/4:28:55 PM    Final    VAS US CAROTID  Result Date: 04/10/2021 Carotid Arterial Duplex Study Patient Name:  EISA NECAISE  Date of Exam:   04/09/2021 Medical Rec #: [id number]     Accession #:    [id number] Date of Birth: [birth year]    Patient Gender: M Patient Age:   59Y Exam Location:  [facility] Procedure:      VAS US CAROTID Referring Phys: [id number] Leslee Home --------------------------------------------------------------------------------  Performing Technologist: Darlin Coco RDMS,RVT  Examination Guidelines: A complete evaluation includes B-mode imaging,  spectral Doppler, color Doppler, and power Doppler as needed of all accessible portions of each vessel. Bilateral testing is considered an integral part of a complete examination. Limited examinations for reoccurring indications may be performed as noted.  Right Carotid Findings: +----------+--------+--------+--------+------------------+------------------+           PSV cm/sEDV cm/sStenosisPlaque DescriptionComments           +----------+--------+--------+--------+------------------+------------------+ CCA Prox  136     25                                intimal thickening +----------+--------+--------+--------+------------------+------------------+ CCA Distal93      20                                intimal thickening +----------+--------+--------+--------+------------------+------------------+ ICA Prox  71      18                                intimal thickening +----------+--------+--------+--------+------------------+------------------+ ICA Distal81      26                                                   +----------+--------+--------+--------+------------------+------------------+ ECA       119     18                                                   +----------+--------+--------+--------+------------------+------------------+ +----------+--------+-------+--------+-------------------+           PSV cm/sEDV cmsDescribeArm Pressure (mmHG) +----------+--------+-------+--------+-------------------+ [id number]                                         +----------+--------+-------+--------+-------------------+ +---------+--------+--+--------+--+---------+ VertebralPSV cm/s54EDV cm/s17Antegrade +---------+--------+--+--------+--+---------+  Left Carotid Findings: +----------+--------+--------+--------+------------------+------------------+           PSV cm/sEDV cm/sStenosisPlaque DescriptionComments            +----------+--------+--------+--------+------------------+------------------+ CCA Prox  100  20                                intimal thickening +----------+--------+--------+--------+------------------+------------------+ CCA Distal103     22                                intimal thickening +----------+--------+--------+--------+------------------+------------------+ ICA Prox  58      21                                intimal thickening +----------+--------+--------+--------+------------------+------------------+ ICA Distal68      23                                                   +----------+--------+--------+--------+------------------+------------------+ ECA       114     13                                intimal thickening +----------+--------+--------+--------+------------------+------------------+ +----------+--------+--------+--------+-------------------+           PSV cm/sEDV cm/sDescribeArm Pressure (mmHG) +----------+--------+--------+--------+-------------------+ [id number]                                         +----------+--------+--------+--------+-------------------+ +---------+--------+--+--------+-+---------+ VertebralPSV cm/s32EDV cm/s9Antegrade +---------+--------+--+--------+-+---------+   Summary: Right Carotid: The extracranial vessels were near-normal with only minimal wall                thickening or plaque. Left Carotid: The extracranial vessels were near-normal with only minimal wall               thickening or plaque. Vertebrals: Bilateral vertebral arteries demonstrate antegrade flow. *See table(s) above for measurements and observations.  Electronically signed by Antony Contras MD on 04/10/2021 at 1:50:46 PM.    Final    VAS [place] TRANSCRANIAL DOPPLER  Result Date: 04/10/2021  Transcranial Doppler Patient Name:  JESE COMELLA  Date of Exam:   04/09/2021 Medical Rec #: [id number]     Accession #:    [id number] Date of Birth:  04/01/1961    Patient Gender: M Patient Age:   59Y Exam Location:  [facility] Procedure:      VAS [place] TRANSCRANIAL DOPPLER Referring Phys: [id number] Tollie Eth --------------------------------------------------------------------------------  Indications: Stroke. Limitations: Seizure disorder- patient unable to remain stationary for portions              of examination. Comparison Study: No prior studies. Performing Technologist: Darlin Coco RDMS,RVT  Examination Guidelines: A complete evaluation includes B-mode imaging, spectral Doppler, color Doppler, and power Doppler as needed of all accessible portions of each vessel. Bilateral testing is considered an integral part of a complete examination. Limited examinations for reoccurring indications may be performed as noted.  +----------+-------------+----------+-----------+-------+ RIGHT TCD Right VM (cm)Depth (cm)PulsatilityComment +----------+-------------+----------+-----------+-------+ MCA           40.00                 1.21            +----------+-------------+----------+-----------+-------+ ACA          -  29.00                 0.97            +----------+-------------+----------+-----------+-------+ Term ICA      31.00                 1.58            +----------+-------------+----------+-----------+-------+ PCA           22.00                 1.16            +----------+-------------+----------+-----------+-------+ Opthalmic     30.00                 1.44            +----------+-------------+----------+-----------+-------+ ICA siphon    26.00                 1.45            +----------+-------------+----------+-----------+-------+ Vertebral    -24.00                 1.00            +----------+-------------+----------+-----------+-------+ Distal ICA    22.00                 1.02            +----------+-------------+----------+-----------+-------+   +----------+------------+----------+-----------+-------+ LEFT TCD  Left VM (cm)Depth (cm)PulsatilityComment +----------+------------+----------+-----------+-------+ MCA          44.00                 1.05            +----------+------------+----------+-----------+-------+ ACA          -27.00                0.72            +----------+------------+----------+-----------+-------+ Term ICA     29.00                 1.10            +----------+------------+----------+-----------+-------+ PCA          35.00                 0.96            +----------+------------+----------+-----------+-------+ Opthalmic    24.00                 0.78            +----------+------------+----------+-----------+-------+ ICA siphon   29.00                 1.41            +----------+------------+----------+-----------+-------+ Vertebral    -19.00                0.77            +----------+------------+----------+-----------+-------+ Distal ICA   24.00                 0.95            +----------+------------+----------+-----------+-------+  +------------+-------+-------+             VM cm/sComment +------------+-------+-------+ Prox Basilar-38.00         +------------+-------+-------+ Dist Basilar-30.00         +------------+-------+-------+ +----------------------+----+ Right Lindegaard Ratio1.82 +----------------------+----+ +---------------------+----+ Left Lindegaard Ratio1.83 +---------------------+----+  Summary: This  was a normal transcranial Doppler study, with normal flow direction and velocity of all identified vessels of the anterior and posterior circulations, with no evidence of stenosis, vasospasm or occlusion. There was no evidence of intracranial disease. *See table(s) above for TCD measurements and observations.  Diagnosing physician: Antony Contras MD Electronically signed by Antony Contras MD on 04/10/2021 at 1:51:13 PM.    Final     Assessment: 60 y.o.  male PMH of astrocytoma with left frontal craniotomy resection 20 years ago, seizures, depression and hypothyroidism, right BG/CR stroke in 2011 was recently discharged to [facility] after again right BG/CR stroke late May. Since the stroke, patient was found to be restless on the right UE > LE and interfere with treatment and therapy. Put on trazodone, getting more drowsiness.  Creatinine elevated on 04/15/2021, received IV fluid bolus x1.  Have also found to have elevated LFTs. UA and CXR neg, no fever.  Pt condition more concerning for toxic-metabolic encephalopathy from AKI and elevated LFTs as well as post stroke delirium/agition. Also could be vascular dementia with behavior disturbance but seems developed too fast from his baseline before the stroke. Less likely new stroke or bleeding given no significant neuro change, less likely for seizure given following commands and cooperative on exam, EEG no seizure but breach rhythm on 5/27. Will recommend IVF for AKI, d/c lipitor 40 for elevated LFTs. Change trazodone to seroquel bid. Encourage po intake and delirium prevention measures.   Plan: - IVF @ 5 through tomorrow  - Correct underlying medical issues as able such as AKI, trend LFTs etc - Seroquel 25mg  in am, 50mg  Qhs - Increase Lexapro to 20mg  daily - Delirium prevention: day/night regulation, decreased light/stimuli at night and shades open and interaction during daytime.  -Given improvement, will s/o. Please call back if needed.   Lalo Tromp Metzger-Cihelka, ARNP-C, ANVP-BC Pager: [phone] 04/18/2021 12:23 PM

## 2021-04-18 NOTE — Progress Notes (Addendum)
PROGRESS NOTE   Subjective/Complaints: Sleepy Carly recommended dynamic night splint for left leg given weakness and tone, ordered Appreciate neurology eval  ROS- continues to not respond to questions Objective:   No results found. Recent Labs    04/16/21 0914 04/18/21 0516  WBC 11.5* 9.1  HGB 16.9 15.8  HCT 49.2 46.5  PLT 322 294   Recent Labs    04/17/21 1108 04/18/21 0516  NA 142 142  K 4.3 4.2  CL 109 108  CO2 26 27  GLUCOSE 124* 100*  BUN 59* 55*  CREATININE 1.27* 1.03  CALCIUM 10.2 9.6    Intake/Output Summary (Last 24 hours) at 04/18/2021 1409 Last data filed at 04/18/2021 0800 Gross per 24 hour  Intake 474 ml  Output --  Net 474 ml        Physical Exam: Vital Signs Blood pressure 109/63, pulse 77, temperature 97.8 F (36.6 C), resp. rate 20, height 5\' 10"  (1.778 m), weight 79.6 kg, SpO2 93 %. Gen: no distress, normal appearing HEENT: oral mucosa pink and moist, NCAT Cardio: Reg rate Chest: normal effort, normal rate of breathing Abd: soft, non-distended Ext: no edema Psych: somnolent Skin: intact  Neurologic:Motor 0/5 LUE, trace hip/knee ext synergy LLE TOne  MAS 4 in Left biceps, wrist and finger flexors  Musculoskeletal: reduced Left elbow ext due to tone, wrist and fingers in flexed position on L side    Assessment/Plan: 1. Functional deficits which require 3+ hours per day of interdisciplinary therapy in a comprehensive inpatient rehab setting.  Physiatrist is providing close team supervision and 24 hour management of active medical problems listed below.  Physiatrist and rehab team continue to assess barriers to discharge/monitor patient progress toward functional and medical goals  Care Tool:  Bathing    Body parts bathed by patient: Chest,Abdomen,Face,Front perineal area   Body parts bathed by helper: Chest,Abdomen,Face,Right upper leg,Left upper leg Body parts n/a: Left  lower leg,Right lower leg,Right arm,Left arm,Front perineal area,Buttocks   Bathing assist Assist Level: 2 Helpers     Upper Body Dressing/Undressing Upper body dressing   What is the patient wearing?: Pull over shirt    Upper body assist Assist Level: Maximal Assistance - Patient 25 - 49%    Lower Body Dressing/Undressing Lower body dressing      What is the patient wearing?: Incontinence brief     Lower body assist Assist for lower body dressing: 2 Helpers     Toileting Toileting    Toileting assist Assist for toileting: 2 Helpers     Transfers Chair/bed transfer  Transfers assist  Chair/bed transfer activity did not occur: Safety/medical concerns  Chair/bed transfer assist level: Total Assistance - Patient < 25% (squat pivot)     Locomotion Ambulation   Ambulation assist   Ambulation activity did not occur: Safety/medical concerns          Walk 10 feet activity   Assist  Walk 10 feet activity did not occur: Safety/medical concerns        Walk 50 feet activity   Assist Walk 50 feet with 2 turns activity did not occur: Safety/medical concerns         Walk  150 feet activity   Assist Walk 150 feet activity did not occur: Safety/medical concerns         Walk 10 feet on uneven surface  activity   Assist Walk 10 feet on uneven surfaces activity did not occur: Safety/medical concerns         Wheelchair     Assist Will patient use wheelchair at discharge?: Yes Type of Wheelchair: Manual Wheelchair activity did not occur: Safety/medical concerns         Wheelchair 50 feet with 2 turns activity    Assist    Wheelchair 50 feet with 2 turns activity did not occur: Safety/medical concerns       Wheelchair 150 feet activity     Assist  Wheelchair 150 feet activity did not occur: Safety/medical concerns       Blood pressure 109/63, pulse 77, temperature 97.8 F (36.6 C), resp. rate 20, height 5\' 10"  (1.778  m), weight 79.6 kg, SpO2 93 %.    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 Antithrombotics: -DVT/anticoagulation:Pharmaceutical:Lovenox -antiplatelet therapy: DAPT x3 weeks followed by ASA alone. 3. Pain Management:Tylenol as needed 4. Depression:LCSW to follow for evaluation and support. Increase Lexapro to 20mg .  -antipsychotic agents: N/AA 5. Neuropsych: This patientis not fullycapable of making decisions on hisown behalf. 6. Skin/Wound Care:Routine pressure-relief measures. 7. Fluids/Electrolytes/Nutrition:Monitor intake/output. Check CMET in am.  8.History of seizure disorder: Continue Lamictal twice daily 9.Left spastic hemiparesis: Continue baclofen 5 mg 3 times daily-->titrated upwards? 10.History of anxiety disorder: Continue Lexapro. Hydroxyzine 3 times daily. 11.Insomnia: Continue melatonin at bedtime. 12.Hypothyroid: Stable on supplement. 13. Spasticity/forming contractures- this has significantly worsened since new CVA, increase baclofen to 10mg  TID, monitor sedation may need Botox as inpt 14. Leukocytosis without fever- improved from 16K to 11K,   Negative UA , recheck UA, unremarkable  CXR  no hypoxia or resp distress- will recheck Monday. Reviewed and resolved 6/4.  15. Elevated LFTs: d/c Lipitor LOS: 4 days A FACE TO FACE EVALUATION WAS PERFORMED  Howard White P Lauralye Kinn 04/18/2021, 2:09 PM

## 2021-04-19 LAB — BASIC METABOLIC PANEL
Anion gap: 5 (ref 5–15)
BUN: 39 mg/dL — ABNORMAL HIGH (ref 6–20)
CO2: 26 mmol/L (ref 22–32)
Calcium: 9.1 mg/dL (ref 8.9–10.3)
Chloride: 110 mmol/L (ref 98–111)
Creatinine, Ser: 0.92 mg/dL (ref 0.61–1.24)
GFR, Estimated: >60 mL/min (ref >60)
Glucose, Bld: 90 mg/dL (ref 70–99)
Potassium: 3.8 mmol/L (ref 3.5–5.1)
Sodium: 141 mmol/L (ref 135–145)

## 2021-04-19 LAB — CBC
HCT: 41.5 % (ref 39.0–52.0)
Hemoglobin: 13.8 g/dL (ref 13.0–17.0)
MCH: 31.3 pg (ref 26.0–34.0)
MCHC: 33.3 g/dL (ref 30.0–36.0)
MCV: 94.1 fL (ref 80.0–100.0)
Platelets: 259 10*3/uL (ref 150–400)
RBC: 4.41 MIL/uL (ref 4.22–5.81)
RDW: 12 % (ref 11.5–15.5)
WBC: 6.6 10*3/uL (ref 4.0–10.5)
nRBC: 0 % (ref 0.0–0.2)

## 2021-04-19 NOTE — Progress Notes (Signed)
PROGRESS NOTE   Subjective/Complaints: No complaints today Restless   ROS- more responsive, but limited  Objective:   No results found. Recent Labs    04/18/21 0516 04/19/21 0611  WBC 9.1 6.6  HGB 15.8 13.8  HCT 46.5 41.5  PLT 294 259   Recent Labs    04/18/21 0516 04/19/21 0611  NA 142 141  K 4.2 3.8  CL 108 110  CO2 27 26  GLUCOSE 100* 90  BUN 55* 39*  CREATININE 1.03 0.92  CALCIUM 9.6 9.1    Intake/Output Summary (Last 24 hours) at 04/19/2021 1523 Last data filed at 04/19/2021 1300 Gross per 24 hour  Intake 594 ml  Output 100 ml  Net 494 ml        Physical Exam: Vital Signs Blood pressure 106/70, pulse 81, temperature 97.8 F (36.6 C), resp. rate 18, height 5\' 10"  (1.778 m), weight 79.6 kg, SpO2 94 %. Gen: no distress, normal appearing HEENT: oral mucosa pink and moist, NCAT Cardio: Reg rate Chest: normal effort, normal rate of breathing Abd: soft, non-distended Ext: no edema Psych: more alert  Neurologic:Motor 0/5 LUE, trace hip/knee ext synergy LLE TOne  MAS 4 in Left biceps, wrist and finger flexors  Musculoskeletal: reduced Left elbow ext due to tone, wrist and fingers in flexed position on L side    Assessment/Plan: 1. Functional deficits which require 3+ hours per day of interdisciplinary therapy in a comprehensive inpatient rehab setting.  Physiatrist is providing close team supervision and 24 hour management of active medical problems listed below.  Physiatrist and rehab team continue to assess barriers to discharge/monitor patient progress toward functional and medical goals  Care Tool:  Bathing    Body parts bathed by patient: Chest,Abdomen,Face,Front perineal area   Body parts bathed by helper: Chest,Abdomen,Face,Right upper leg,Left upper leg Body parts n/a: Left lower leg,Right lower leg,Right arm,Left arm,Front perineal area,Buttocks   Bathing assist Assist Level: 2  Helpers     Upper Body Dressing/Undressing Upper body dressing   What is the patient wearing?: Pull over shirt    Upper body assist Assist Level: Maximal Assistance - Patient 25 - 49%    Lower Body Dressing/Undressing Lower body dressing      What is the patient wearing?: Pants,Incontinence brief     Lower body assist Assist for lower body dressing: Maximal Assistance - Patient 25 - 49%     Toileting Toileting    Toileting assist Assist for toileting: 2 Helpers     Transfers Chair/bed transfer  Transfers assist  Chair/bed transfer activity did not occur: Safety/medical concerns  Chair/bed transfer assist level: 2 Helpers (+2 max A squat pivot)     Locomotion Ambulation   Ambulation assist   Ambulation activity did not occur: Safety/medical concerns          Walk 10 feet activity   Assist  Walk 10 feet activity did not occur: Safety/medical concerns        Walk 50 feet activity   Assist Walk 50 feet with 2 turns activity did not occur: Safety/medical concerns         Walk 150 feet activity   Assist Walk 150 feet  activity did not occur: Safety/medical concerns         Walk 10 feet on uneven surface  activity   Assist Walk 10 feet on uneven surfaces activity did not occur: Safety/medical concerns         Wheelchair     Assist Will patient use wheelchair at discharge?: Yes Type of Wheelchair: Manual Wheelchair activity did not occur: Safety/medical concerns         Wheelchair 50 feet with 2 turns activity    Assist    Wheelchair 50 feet with 2 turns activity did not occur: Safety/medical concerns       Wheelchair 150 feet activity     Assist  Wheelchair 150 feet activity did not occur: Safety/medical concerns       Blood pressure 106/70, pulse 81, temperature 97.8 F (36.6 C), resp. rate 18, height 5\' 10"  (1.778 m), weight 79.6 kg, SpO2 94 %.    Medical Problem List and Plan: 1.L hemiplegia  worsening due to recurrent R CR/BG infarct, has associated spasticity and contracture formation- spasticity has worsened significantly with new infarct , cognitive status declined Continue CIR PT, OT, SLP, speech dysarthric hypophonic and delayed Has features of cortical lesion although scan showing subcortical involvement will discuss with neuro  -ELOS/Goals:min A 17-20 days 2. Antithrombotics: -DVT/anticoagulation:Pharmaceutical:Lovenox -antiplatelet therapy: DAPT x3 weeks followed by ASA alone. 3. Pain Management:Continue Tylenol as needed 4. Depression:LCSW to follow for evaluation and support. Increase Lexapro to 20mg , continue -antipsychotic agents: N/AA 5. Neuropsych: This patientis not fullycapable of making decisions on hisown behalf. 6. Skin/Wound Care:Routine pressure-relief measures. 7. Fluids/Electrolytes/Nutrition:Monitor intake/output. Check CMET in am.  8.History of seizure disorder: Continue Lamictal twice daily 9.Left spastic hemiparesis: Continue baclofen 5 mg 3 times daily-->titrated upwards? 10.History of anxiety disorder: Continue Lexapro. Hydroxyzine 3 times daily. 11.Insomnia: Continue melatonin at bedtime.Change trazodone to Seroquel as per neuro.  12.Hypothyroid: Stable on supplement. 13. Spasticity/forming contractures- this has significantly worsened since new CVA, increase baclofen to 10mg  TID, monitor sedation may need Botox as inpt 14. Leukocytosis without fever- improved from 16K to 11K,   Negative UA , recheck UA, unremarkable  CXR  no hypoxia or resp distress- will recheck Monday. Reviewed and resolved 6/4.  15. Elevated LFTs: d/c Lipitor 16. AKI: continue IVF until 6/5, resolved  LOS: 5 days A FACE TO FACE EVALUATION WAS PERFORMED  Howard White 04/19/2021, 3:23 PM

## 2021-04-20 ENCOUNTER — Inpatient Hospital Stay (HOSPITAL_COMMUNITY): Payer: PPO

## 2021-04-20 ENCOUNTER — Ambulatory Visit: Payer: Self-pay | Admitting: Occupational Therapy

## 2021-04-20 LAB — CBC
HCT: 40.7 % (ref 39.0–52.0)
Hemoglobin: 13.8 g/dL (ref 13.0–17.0)
MCH: 31.6 pg (ref 26.0–34.0)
MCHC: 33.9 g/dL (ref 30.0–36.0)
MCV: 93.1 fL (ref 80.0–100.0)
Platelets: 258 10*3/uL (ref 150–400)
RBC: 4.37 MIL/uL (ref 4.22–5.81)
RDW: 11.8 % (ref 11.5–15.5)
WBC: 6 10*3/uL (ref 4.0–10.5)
nRBC: 0 % (ref 0.0–0.2)

## 2021-04-20 LAB — BASIC METABOLIC PANEL
Anion gap: 5 (ref 5–15)
BUN: 22 mg/dL — ABNORMAL HIGH (ref 6–20)
CO2: 27 mmol/L (ref 22–32)
Calcium: 8.8 mg/dL — ABNORMAL LOW (ref 8.9–10.3)
Chloride: 110 mmol/L (ref 98–111)
Creatinine, Ser: 0.85 mg/dL (ref 0.61–1.24)
GFR, Estimated: >60 mL/min (ref >60)
Glucose, Bld: 92 mg/dL (ref 70–99)
Potassium: 3.8 mmol/L (ref 3.5–5.1)
Sodium: 142 mmol/L (ref 135–145)

## 2021-04-20 MED ORDER — BACLOFEN 5 MG HALF TABLET
15.0000 mg | ORAL_TABLET | Freq: Three times a day (TID) | ORAL | Status: DC
  Administered 2021-04-20 – 2021-04-22 (×6): 15 mg via ORAL
  Filled 2021-04-20 (×6): qty 1

## 2021-04-20 NOTE — Progress Notes (Addendum)
Occupational Therapy Session Note  Patient Details  Name: Howard White MRN: [id number] Date of Birth: [birth year]  Today's Date: 04/20/2021 OT Individual Time: 1856-3149 OT Individual Time Calculation (min): 49 min    Short Term Goals: Week 1:  OT Short Term Goal 1 (Week 1): Pt will complete static sitting balance EOB with min assist in preparation for selfcare tasks. OT Short Term Goal 2 (Week 1): Pt will complete rolling in the bed with max assist to the right and mod assist to the left to assist with toileting tasks and LB clothing management. OT Short Term Goal 3 (Week 1): Pt will tolerate hand splint for positioning of the LUE into slight extension. OT Short Term Goal 4 (Week 1): Pt will complete UB bathing sitting EOB with no more than mod assist and mod instructional cueing for  Skilled Therapeutic Interventions/Progress Updates:    Session 1: (7026-3785)  Pt in bed with nursing assisting with cleaning up of bowel incontinence to start session.  Pt more vocal but still with dysarthria so difficult to understand.  He was able to transfer from supine to sit with max assist, noted increased extensor tone in the LLE with increased flexor tone in the LUE.  Flexed posture at the trunk and neck in sitting with left lateral cervical tilt as well.  Mod assist for sitting balance while working on dressing tasks.  Max assist for UB bathing to donn a pullover shirt with max assist as well sit to stand for donning his pull up shorts.  Increased pushing to the left noted throughout with frequent positioning of him on the right forearm to help decrease pushing.  He was able to complete transfer squat pivot to the tilt in space wheelchair on the left with max assist.  Therapist completed light stretching to his elbow, wrist, and digit flexors prior to transfer with slight improvement noted.  Soft palm guard placed in the left hand with lap tray and pillow in place to help keep the flexed elbow away from his  chest.  Call button and phone in reach with safety belt in place and his mother in the room as well.    Session 2: (1300-1400)  Worked on fabrication of resting hand splint to assist with positioning of the wrist, hand, and digits of the LUE.  Also completed gentle PROM stretching to these joints as well with increased tone noted in the wrist and digits.  Will continue with fit and testing next session.  For now pt still with palm guard in place on the LUE.  Orthotist in as well with example of hinged adjustable elbow brace.  He will order and bring in for fit tomorrow.  Finished session with return to the room and SLP in for next session.  Discussed with nursing the need to transfer back to the bed at end of session.   Therapy Documentation Precautions:  Precautions Precautions: Fall Precaution Comments: severe L hemi tone, R hemi dyskinesia (?), Pushing L, aphasia Restrictions Weight Bearing Restrictions: No  Pain: Pain Assessment Pain Scale: 0-10 Pain Score: 0-No pain Faces Pain Scale: Hurts a little bit Pain Type: Acute pain Pain Location: Arm Pain Orientation: Left Pain Descriptors / Indicators: Discomfort Pain Onset: With Activity Pain Intervention(s): Repositioned ADL: See Care Tool Section for some details of mobility and selfcare  Therapy/Group: Individual Therapy  Makarios Madlock OTR/L 04/20/2021, 12:14 PM

## 2021-04-20 NOTE — Progress Notes (Signed)
PROGRESS NOTE   Subjective/Complaints:   Discussed rehab med issues with pt's mother, discussed neuro impression as well  Pt on D1 thin liq diet ,has issues with poor intake  Reviewed labs   ROS- more responsive, but limited  Objective:   No results found. Recent Labs    04/19/21 0611 04/20/21 0653  WBC 6.6 6.0  HGB 13.8 13.8  HCT 41.5 40.7  PLT 259 258   Recent Labs    04/19/21 0611 04/20/21 0653  NA 141 142  K 3.8 3.8  CL 110 110  CO2 26 27  GLUCOSE 90 92  BUN 39* 22*  CREATININE 0.92 0.85  CALCIUM 9.1 8.8*    Intake/Output Summary (Last 24 hours) at 04/20/2021 0821 Last data filed at 04/20/2021 0756 Gross per 24 hour  Intake 1423 ml  Output --  Net 1423 ml        Physical Exam: Vital Signs Blood pressure 105/61, pulse (!) 53, temperature 98 F (36.7 C), temperature source Oral, resp. rate 16, height 5\' 10"  (1.778 m), weight 79.6 kg, SpO2 95 %.  General: No acute distress Mood and affect are appropriate Heart: Regular rate and rhythm no rubs murmurs or extra sounds Lungs: Clear to auscultation, breathing unlabored, no rales or wheezes Abdomen: Positive bowel sounds, soft nontender to palpation, nondistended Extremities: No clubbing, cyanosis, or edema Skin: No evidence of breakdown, no evidence of rash  Neurologic:Motor 0/5 LUE, trace hip/knee ext synergy LLE TOne  MAS 4 in Left biceps, wrist and finger flexors  Musculoskeletal: reduced Left elbow ext due to tone, wrist and fingers in flexed position on L side    Assessment/Plan: 1. Functional deficits which require 3+ hours per day of interdisciplinary therapy in a comprehensive inpatient rehab setting.  Physiatrist is providing close team supervision and 24 hour management of active medical problems listed below.  Physiatrist and rehab team continue to assess barriers to discharge/monitor patient progress toward functional and medical  goals  Care Tool:  Bathing    Body parts bathed by patient: Chest,Abdomen,Face,Front perineal area   Body parts bathed by helper: Chest,Abdomen,Face,Right upper leg,Left upper leg Body parts n/a: Left lower leg,Right lower leg,Right arm,Left arm,Front perineal area,Buttocks   Bathing assist Assist Level: 2 Helpers     Upper Body Dressing/Undressing Upper body dressing   What is the patient wearing?: Pull over shirt    Upper body assist Assist Level: Maximal Assistance - Patient 25 - 49%    Lower Body Dressing/Undressing Lower body dressing      What is the patient wearing?: Pants,Incontinence brief     Lower body assist Assist for lower body dressing: Maximal Assistance - Patient 25 - 49%     Toileting Toileting    Toileting assist Assist for toileting: 2 Helpers     Transfers Chair/bed transfer  Transfers assist  Chair/bed transfer activity did not occur: Safety/medical concerns  Chair/bed transfer assist level: 2 Helpers (+2 max A squat pivot)     Locomotion Ambulation   Ambulation assist   Ambulation activity did not occur: Safety/medical concerns          Walk 10 feet activity   Assist  Walk  10 feet activity did not occur: Safety/medical concerns        Walk 50 feet activity   Assist Walk 50 feet with 2 turns activity did not occur: Safety/medical concerns         Walk 150 feet activity   Assist Walk 150 feet activity did not occur: Safety/medical concerns         Walk 10 feet on uneven surface  activity   Assist Walk 10 feet on uneven surfaces activity did not occur: Safety/medical concerns         Wheelchair     Assist Will patient use wheelchair at discharge?: Yes Type of Wheelchair: Manual Wheelchair activity did not occur: Safety/medical concerns         Wheelchair 50 feet with 2 turns activity    Assist    Wheelchair 50 feet with 2 turns activity did not occur: Safety/medical concerns        Wheelchair 150 feet activity     Assist  Wheelchair 150 feet activity did not occur: Safety/medical concerns       Blood pressure 105/61, pulse (!) 53, temperature 98 F (36.7 C), temperature source Oral, resp. rate 16, height 5\' 10"  (1.778 m), weight 79.6 kg, SpO2 95 %.    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 Antithrombotics: -DVT/anticoagulation:Pharmaceutical:Lovenox -antiplatelet therapy: DAPT x3 weeks followed by ASA alone. 3. Pain Management:Continue Tylenol as needed 4. Depression:LCSW to follow for evaluation and support. Increase Lexapro to 20mg , continue -antipsychotic agents: N/AA 5. Neuropsych: This patientis not fullycapable of making decisions on hisown behalf. 6. Skin/Wound Care:Routine pressure-relief measures. 7. Fluids/Electrolytes/Nutrition:Monitor intake/output. Check CMET in am.  8.History of seizure disorder: Continue Lamictal twice daily 9.Left spastic hemiparesis: increase baclofen 15 mg 3 times daily- plan botox on Wed  10.History of anxiety disorder: Continue Lexapro. Hydroxyzine 3 times daily. 11.Insomnia: Continue melatonin at bedtime.Change trazodone to Seroquel as per neuro.  12.Hypothyroid: Stable on supplement. 13. Spasticity/forming contractures- this has significantly worsened since new CVA, increase baclofen to 10mg  TID, monitor sedation may need Botox as inpt 14. Leukocytosis without fever- improved from 16K to 11K,   Negative UA , recheck UA, unremarkable  CXR  no hypoxia or resp distress-  will recheck Monday. Reviewed and resolved 6/4.  15. Elevated LFTs: d/c Lipitor 16. AKI: continue IVF fluid intakeremains poor  But is able to take thin liquids   LOS: 6 days A FACE TO FACE EVALUATION WAS PERFORMED  Charlett Blake 04/20/2021, 8:21 AM

## 2021-04-20 NOTE — Progress Notes (Signed)
Physical Therapy Session Note  Patient Details  Name: Howard White MRN: [id number] Date of Birth: 22-Oct-1961  Today's Date: 04/20/2021 PT Individual Time: 1002-1100 PT Individual Time Calculation (min): 58 min   Short Term Goals: Week 1:  PT Short Term Goal 1 (Week 1): Patient will roll B with no more than MaxA PT Short Term Goal 2 (Week 1): Patient will complete sit <> supine with no more than ModA PT Short Term Goal 3 (Week 1): Patient will complete sit <> stand with LRAD and +2 MaxA safely  Skilled Therapeutic Interventions/Progress Updates: Pt presents sitting up in TIS w/c and agreeable to therapy.  Pt mother present at initiation of therapy.  Pt sitting w/ flexed trunk , neck and kyphotic posture.  Pt very lethargic and performs c/spine flex/extension 3 x 10 w/ continual re-direction to task.  Pt performed LAQ BLEs, w/ manual A for complete ROM LLE.  Pt tolerated long stretches to L fingers  (full extension), wrist into extension and elbow to approx. - 50 degrees.  Pt performs well w/ visual cue for end-range (kick my hand).  Pt performed sit to stand and SPT w/c > bed w/ max A and blocking L knee.  Pt sat EOB w/ verbal cueing for mid-line sitting and performing spine and neck extension maintaining balance w/ occasional min A.  Pt required max A for sit to supine and then verbal cues for bridging to improve positioning in bed, as well as maintaining head in midline (tends to SB to left).  Pt required max A for rolling to R and sitting.  Pt performed sit to stand and SPT w/ Total A to TIS.  Pt tilted back w/ chair alarm oin and all needs in reach.     Therapy Documentation Precautions:  Precautions Precautions: Fall Precaution Comments: severe L hemi tone, R hemi dyskinesia (?), Pushing L, aphasia Restrictions Weight Bearing Restrictions: No General:   Vital Signs:  Pain:3/10 to neck. Pain Assessment Pain Scale: 0-10 Pain Score: 0-No pain Mobility:   Locomotion :     Trunk/Postural Assessment :    Balance:   Exercises:   Other Treatments:      Therapy/Group: Individual Therapy  Ladoris Gene 04/20/2021, 11:30 AM

## 2021-04-20 NOTE — Progress Notes (Signed)
Speech Language Pathology Daily Session Note  Patient Details  Name: Yannick Steuber MRN: [id number] Date of Birth: 19-Nov-1960  Today's Date: 04/20/2021 SLP Individual Time: 1400-1500 SLP Individual Time Calculation (min): 60 min  Short Term Goals: Week 1: SLP Short Term Goal 1 (Week 1): Pt will demonstrate sustained attention to functional tasks for 4-5 minutes with min A verbal cues. SLP Short Term Goal 2 (Week 1): Pt will respond to functional yes/no questions verbally or with gestures with 80% accuracy mod A visual cues. SLP Short Term Goal 3 (Week 1): Pt will tolerate dys 3 diet and thin liquids with no overt s/sx of aspiration or penetration on 9/10 trials. SLP Short Term Goal 4 (Week 1): Pt will follow basic one step commands in 60% of opportunities with mod A verbal cues. SLP Short Term Goal 5 (Week 1): Pt will utilize an increased vocal intensity at the word level to achieve 50% intelligibility with max a visual and verbal cues.  Skilled Therapeutic Interventions:Skilled ST services focused on cognitive skills. SLP facilitated response to yes/no questions, pt answered simple yes/no questions pertaining to biographical information/immediate environment in 5 out 5 opportunities and complex yes/no questions in 3 out 5 opportunities. Pt was able to follow 1 step body commands in 5 out 5 opportunities, but only able to follow 2 step body commands in 2 out 5 opportunities due to impulsivity and attention. In structured task pt was able to increase vocal intensity and slow rate when naming common objects requiring 30% cues for speech intelligibility, however at phrase level (pictutre description task of actions) pt required max A verbal cues to utilize 2-3 words per breath/slow rate and increase vocal intensity. In conversation pt required max A verbal cues for speech intelligibility of 30%. SLP facilitated basic problem solving, sustained attention and recall in card sorting task by color, pt was able  to sort cards by 3-4 colors with cues for sustained attention in 1-2 minutes, mod A verbal cues for recall and problem solving. Nurse and NT assisted transfer of pt into bed from [facility]. Pt was left with call bell within reach and bed alarm set. Recommend to continue skilled ST services.     Pain Pain Assessment Pain Scale: 0-10 Pain Score: 0-No pain Faces Pain Scale: Hurts a little bit Pain Type: Acute pain Pain Location: Arm Pain Orientation: Left Pain Descriptors / Indicators: Discomfort Pain Onset: With Activity Pain Intervention(s): Repositioned  Therapy/Group: Individual Therapy  Koryn Charlot  [facility] 04/20/2021, 12:30 PM

## 2021-04-21 NOTE — Progress Notes (Signed)
Speech Language Pathology Daily Session Note  Patient Details  Name: Vishnu Moeller MRN: [id number] Date of Birth: 1960-12-03  Today's Date: 04/21/2021 SLP Individual Time: 1002-1030 SLP Individual Time Calculation (min): 28 min  Short Term Goals: Week 1: SLP Short Term Goal 1 (Week 1): Pt will demonstrate sustained attention to functional tasks for 4-5 minutes with min A verbal cues. SLP Short Term Goal 2 (Week 1): Pt will respond to functional yes/no questions verbally or with gestures with 80% accuracy mod A visual cues. SLP Short Term Goal 3 (Week 1): Pt will tolerate dys 3 diet and thin liquids with no overt s/sx of aspiration or penetration on 9/10 trials. SLP Short Term Goal 4 (Week 1): Pt will follow basic one step commands in 60% of opportunities with mod A verbal cues. SLP Short Term Goal 5 (Week 1): Pt will utilize an increased vocal intensity at the word level to achieve 50% intelligibility with max a visual and verbal cues.  Skilled Therapeutic Interventions:Skilled ST services focused on cognitive skills. SLP facilitated basic problem solving and carryover of speech intelligibility strategies in picture task of identifying 3 differences. Pt was able to identify 2 out 3 differences in 4 out 5 opportunities. Pt required max A verbal cues to slow rate (produce 1-2 words per breath) and increase vocal intensity, pt was able to carryover strategy without cues on 5 occasions. Pt's sustained attention continues to be impacted by restlessness and pt supports due to anxiety. Pt was left in room with call bell within reach and bed alarm set. SLP recommends to continue skilled services.     Pain Pain Assessment Pain Score: 0-No pain  Therapy/Group: Individual Therapy  Ruchama Kubicek  [facility] 04/21/2021, 12:28 PM

## 2021-04-21 NOTE — Progress Notes (Signed)
Occupational Therapy Session Note  Patient Details  Name: Howard White MRN: [id number] Date of Birth: 1961-02-05  Today's Date: 04/21/2021 OT Individual Time: 1300-1415 OT Individual Time Calculation (min): 75 min    Short Term Goals: Week 1:  OT Short Term Goal 1 (Week 1): Pt will complete static sitting balance EOB with min assist in preparation for selfcare tasks. OT Short Term Goal 2 (Week 1): Pt will complete rolling in the bed with max assist to the right and mod assist to the left to assist with toileting tasks and LB clothing management. OT Short Term Goal 3 (Week 1): Pt will tolerate hand splint for positioning of the LUE into slight extension. OT Short Term Goal 4 (Week 1): Pt will complete UB bathing sitting EOB with no more than mod assist and mod instructional cueing for  Skilled Therapeutic Interventions/Progress Updates:    Pt in bed to start session.  Completed stretching to the left elbow, wrist, and digits.  Worked on finishing the resting hand splint for the left hand and completing fitting.  Went back after 1.5 hours to check fit and no redness noted with pt tolerating it well.  Removed splint to add more padding and re-donned his elbow extension splint.  Educated nursing on removal of splint and adjustment if pt reports increased pain.    Therapy Documentation Precautions:  Precautions Precautions: Fall Precaution Comments: severe L hemi tone, R hemi dyskinesia (?), Pushing L, aphasia Restrictions Weight Bearing Restrictions: No   Pain: Pain Assessment Pain Scale: Faces Pain Score: 0-No pain   Therapy/Group: Individual Therapy  Janari Yamada OTR/L 04/21/2021, 3:46 PM

## 2021-04-21 NOTE — Progress Notes (Signed)
PROGRESS NOTE   Subjective/Complaints:   Vocal volume improved, recognizes me this am   ROS- more responsive, but limited  Objective:   DG Chest 1 View  Result Date: 04/20/2021 CLINICAL DATA:  Aspiration of food. EXAM: CHEST  1 VIEW COMPARISON:  Radiograph 04/16/2021 FINDINGS: The cardiomediastinal contours are normal. Similar streaky left basilar opacities to prior. Pulmonary vasculature is normal. No pleural effusion or pneumothorax. No acute osseous abnormalities are seen. IMPRESSION: Similar streaky left basilar opacities to prior, atelectasis or can be seen in the setting of aspiration. Electronically Signed   By: Keith Rake M.D.   On: 04/20/2021 21:30   Recent Labs    04/19/21 0611 04/20/21 0653  WBC 6.6 6.0  HGB 13.8 13.8  HCT 41.5 40.7  PLT 259 258   Recent Labs    04/19/21 0611 04/20/21 0653  NA 141 142  K 3.8 3.8  CL 110 110  CO2 26 27  GLUCOSE 90 92  BUN 39* 22*  CREATININE 0.92 0.85  CALCIUM 9.1 8.8*    Intake/Output Summary (Last 24 hours) at 04/21/2021 0741 Last data filed at 04/20/2021 1752 Gross per 24 hour  Intake 653 ml  Output --  Net 653 ml        Physical Exam: Vital Signs Blood pressure (!) 92/56, pulse (!) 55, temperature 98.8 F (37.1 C), resp. rate 16, height 5\' 10"  (1.778 m), weight 79.6 kg, SpO2 96 %.   General: No acute distress Mood and affect are appropriate Heart: Regular rate and rhythm no rubs murmurs or extra sounds Lungs: Clear to auscultation, breathing unlabored, no rales or wheezes Abdomen: Positive bowel sounds, soft nontender to palpation, nondistended Extremities: No clubbing, cyanosis, or edema Skin: No evidence of breakdown, no evidence of rash  Neurologic:Motor 0/5 LUE, trace hip/knee ext synergy LLE TOne  MAS 4 in Left biceps, wrist and finger flexors  Musculoskeletal: reduced Left elbow ext due to tone, wrist and fingers in flexed position on L side  unchanged    Assessment/Plan: 1. Functional deficits which require 3+ hours per day of interdisciplinary therapy in a comprehensive inpatient rehab setting.  Physiatrist is providing close team supervision and 24 hour management of active medical problems listed below.  Physiatrist and rehab team continue to assess barriers to discharge/monitor patient progress toward functional and medical goals  Care Tool:  Bathing    Body parts bathed by patient: Chest,Abdomen,Face,Front perineal area   Body parts bathed by helper: Chest,Abdomen,Face,Right upper leg,Left upper leg Body parts n/a: Left lower leg,Right lower leg,Right arm,Left arm,Front perineal area,Buttocks   Bathing assist Assist Level: 2 Helpers     Upper Body Dressing/Undressing Upper body dressing   What is the patient wearing?: Pull over shirt    Upper body assist Assist Level: Maximal Assistance - Patient 25 - 49%    Lower Body Dressing/Undressing Lower body dressing      What is the patient wearing?: Pants     Lower body assist Assist for lower body dressing: Maximal Assistance - Patient 25 - 49%     Toileting Toileting    Toileting assist Assist for toileting: 2 Helpers     Transfers Chair/bed transfer  Transfers assist  Chair/bed transfer activity did not occur: Safety/medical concerns  Chair/bed transfer assist level: Maximal Assistance - Patient 25 - 49% (squat pivot to the left)     Locomotion Ambulation   Ambulation assist   Ambulation activity did not occur: Safety/medical concerns          Walk 10 feet activity   Assist  Walk 10 feet activity did not occur: Safety/medical concerns        Walk 50 feet activity   Assist Walk 50 feet with 2 turns activity did not occur: Safety/medical concerns         Walk 150 feet activity   Assist Walk 150 feet activity did not occur: Safety/medical concerns         Walk 10 feet on uneven surface  activity   Assist Walk  10 feet on uneven surfaces activity did not occur: Safety/medical concerns         Wheelchair     Assist Will patient use wheelchair at discharge?: Yes Type of Wheelchair: Manual Wheelchair activity did not occur: Safety/medical concerns         Wheelchair 50 feet with 2 turns activity    Assist    Wheelchair 50 feet with 2 turns activity did not occur: Safety/medical concerns       Wheelchair 150 feet activity     Assist  Wheelchair 150 feet activity did not occur: Safety/medical concerns       Blood pressure (!) 92/56, pulse (!) 55, temperature 98.8 F (37.1 C), resp. rate 16, height 5\' 10"  (1.778 m), weight 79.6 kg, SpO2 96 %.    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 Antithrombotics: -DVT/anticoagulation:Pharmaceutical:Lovenox -antiplatelet therapy: DAPT x3 weeks followed by ASA alone. 3. Pain Management:Continue Tylenol as needed 4. Depression:LCSW to follow for evaluation and support. Increase Lexapro to 20mg , continue -antipsychotic agents: N/AA 5. Neuropsych: This patientis not fullycapable of making decisions on hisown behalf. Neurostim with amantadine check with team in am , may increase dose 6. Skin/Wound Care:Routine pressure-relief measures. 7. Fluids/Electrolytes/Nutrition:Monitor intake/output. nl BUN/Cr 8.History of seizure disorder: Continue Lamictal twice daily 9.Left spastic hemiparesis: increase baclofen 15 mg 3 times daily- plan botox on Wed  10.History of anxiety disorder: Continue  Lexapro. Hydroxyzine 3 times daily. 11.Insomnia: Continue melatonin at bedtime.Change trazodone to Seroquel as per neuro.  12.Hypothyroid: Stable on supplement. 13. Spasticity/forming contractures- this has significantly worsened since new CVA, increase baclofen to 10mg  TID, monitor sedation may need Botox as inpt 14. Leukocytosis without fever- improved from 16K to 11K,   Negative UA , recheck UA, unremarkable  CXR  no hypoxia or resp distress- will recheck Monday. Reviewed and resolved  15. Elevated LFTs: d/c Lipitor 16. AKI: continue IVF fluid intakeremains poor  But is able to take thin liquids , drinking a little more  17.  Dysphagia- poor posture in bed may need to get in [employer] to eat LOS: 7 days A FACE TO FACE EVALUATION WAS PERFORMED  Charlett Blake 04/21/2021, 7:41 AM

## 2021-04-21 NOTE — Progress Notes (Signed)
Physical Therapy Session Note  Patient Details  Name: Howard White MRN: [id number] Date of Birth: Aug 13, 1961  Today's Date: 04/21/2021 PT Individual Time: 5597-4163 PT Individual Time Calculation (min): 66 min   Short Term Goals: Week 1:  PT Short Term Goal 1 (Week 1): Patient will roll B with no more than MaxA PT Short Term Goal 2 (Week 1): Patient will complete sit <> supine with no more than ModA PT Short Term Goal 3 (Week 1): Patient will complete sit <> stand with LRAD and +2 MaxA safely  Skilled Therapeutic Interventions/Progress Updates:    Pt received supine in bed, awake and upon therapist entry pt clearly states hello to therapist. Pt has clearer speech with increased intelligibility at beginning of session with pt cracking a few jokes; however, throughout session speech becomes much more quiet, mumbled, and unintelligible despite cuing for pt to speak slower, louder, and with increased articulation - anticipate this is due to fatigue. Pt agreeable to therapy session. Supine>sitting R EOB, HOB slightly elevated and using bedrail, pt demos improved initiation of this task but with increased L LE extensor tone and poor motor planning to bring LE into flexed posture to assist with rolling results in pt requiring heavy max/total assist to manage B LEs off EOB and bring trunk upright. Sitting EOB pt initially able to maintain static sitting with CGA (cuing to keep R UE abducted to decrease/prevent pushing) but progresses to mod/max assist throughout session due to fatigue (continues to have extremely excessive trunk/neck flexed posturing) - during dynamic sitting balance though pt requires max/total assist to prevent L posterior lean/LOB. Sitting EOB donned shirt, shorts, and shoes max assist. Sit>stand EOB>R UE around +2 assist's shoulders with +2 max assist for lifting to stand and balance due to pt having pushing towards L side with excessive trunk/neck flexed posturing - with cuing able to  improve upright but gradually moves back into flexed posture - total assist to pull shorts over hips. L squat pivot EOB>TIS w/c with max assist of 1 (+2 for safety) - cuing for head/hips relationship and R UE positioning to prevent pushing.  Transported to/from gym in w/c for time management and energy conservation. R squat pivot TIS w/c>EOM with max assist of 1 for lifting/pivoting hips and maintaining trunk control (+2 present for safety) - continued cuing for head/hips relationship. Sitting EOM participated in reaching task to promote R anterior trunk lean/weight shift to grasp object and then place at midline - mirror feedback - pt initially able to maintain balance with CGA/close supervision but with fatigue requires max assist to prevent L posterior LOB. Sit<>stand to/from EOM 2x with R UE support around +2 assist's shoulders - +2 max assist for lifting to stand and to prevent L lean due to R LE pushing - mirror feedback and cuing for increased upright trunk posture with trunk/hip extension. L squat pivot EOM>w/c with assist as above. Pt reports need to use bathroom - transported back to room. R squat pivot w/c>EOB as above. Sit>supine heavy max assist. Rolling L min assist and rolling R max assist during total assist LB clothing management - pt noted to be incontinent of bowels - total assist peri-care. Pt reports feeling urge to void further - placed on bedpan and notified nursing staff. Pt left supine in bed with needs in reach, bed alarm on, and nursing staff notified to assist pt off bedpan.   Therapy Documentation Precautions:  Precautions Precautions: Fall Precaution Comments: severe L hemi tone, R hemi dyskinesia (?),  Pushing L, aphasia Restrictions Weight Bearing Restrictions: No  Pain:   Reports the L LE dynamic night splint was uncomfortable the other evening - educated pt on ability to adjust tightness and positioning of straps to decrease discomfort and reasoning for why dynamic night  splint is more advantageous compared to [facility]    Therapy/Group: Individual Therapy  Tawana Scale , PT, DPT, CSRS  04/21/2021, 7:52 AM

## 2021-04-22 MED ORDER — BACLOFEN 5 MG HALF TABLET
15.0000 mg | ORAL_TABLET | Freq: Four times a day (QID) | ORAL | Status: DC
  Administered 2021-04-22 – 2021-05-06 (×56): 15 mg via ORAL
  Filled 2021-04-22 (×57): qty 1

## 2021-04-22 MED ORDER — HYDROXYZINE HCL 10 MG PO TABS
10.0000 mg | ORAL_TABLET | Freq: Three times a day (TID) | ORAL | Status: DC
  Administered 2021-04-22 – 2021-05-09 (×49): 10 mg via ORAL
  Filled 2021-04-22 (×54): qty 1

## 2021-04-22 NOTE — Progress Notes (Signed)
Patient ID: Howard White, male   DOB: 10-Sep-1961, 60 y.o.   MRN: [id number] Team Conference Report to Patient/Family  Team Conference discussion was reviewed with the patient and caregiver, including goals, any changes in plan of care and target discharge date.  Patient and caregiver express understanding and are in agreement.  The patient has a target discharge date of 05/09/21.  SW met with pt, mother and father. Provided conference updates. Pt father unable to provide much assistance with back precautions but can do some lifting. Pt mother reports she is able to provide whatever amount of assistance. Both reports they wont have any other family in the home. SW will make pt a [facility] referral. Pt mother concerned about the size of pt's wheelchair and d/c date. SW informed patient that we will discuss this closer to d/c date and on conference.  [name] 04/22/2021, 1:39 PM

## 2021-04-22 NOTE — Progress Notes (Signed)
Speech Language Pathology Weekly Progress and Session Note  Patient Details  Name: Howard White MRN: [id number] Date of Birth: May 28, 1961  Beginning of progress report period: 04/15/2021 End of progress report period: 04/22/2021  Today's Date: 04/22/2021 SLP Individual Time: 1300-1400 SLP Individual Time Calculation (min): 60 min  Short Term Goals: Week 1: SLP Short Term Goal 1 (Week 1): Pt will demonstrate sustained attention to functional tasks for 4-5 minutes with min A verbal cues. SLP Short Term Goal 1 - Progress (Week 1): Not met SLP Short Term Goal 2 (Week 1): Pt will respond to functional yes/no questions verbally or with gestures with 80% accuracy mod A visual cues. SLP Short Term Goal 2 - Progress (Week 1): Not met SLP Short Term Goal 3 (Week 1): Pt will tolerate dys 3 diet and thin liquids with no overt s/sx of aspiration or penetration on 9/10 trials. SLP Short Term Goal 3 - Progress (Week 1): Not met SLP Short Term Goal 4 (Week 1): Pt will follow basic one step commands in 60% of opportunities with mod A verbal cues. SLP Short Term Goal 4 - Progress (Week 1): Not met SLP Short Term Goal 5 (Week 1): Pt will utilize an increased vocal intensity at the word level to achieve 50% intelligibility with max a visual and verbal cues. SLP Short Term Goal 5 - Progress (Week 1): Not met    New Short Term Goals: Week 2: SLP Short Term Goal 1 (Week 2): Patient will tolerate trials of upgraded solids textures (Dys 2 and Dys 3) with modA cues for effective mastication and oral clearance. SLP Short Term Goal 2 (Week 2): Patient will achieve adequate voicing at 1-2 word level with maxA cues. SLP Short Term Goal 3 (Week 2): Patient will demonstrate sustained attention to tasks for increments of 2-3 minutes with modA cues to redirect. SLP Short Term Goal 4 (Week 2): Patient will follow 1-step functional instructions with modA cues. SLP Short Term Goal 5 (Week 2): Patient will respond to open-ended  questions and/or describe at 2-3 word phrase level with maxA for intelligibility.  Weekly Progress Updates:  Patient unfortunately did not meet any STG's as he was exhibiting somewhat of a decline in function, with diet having to be downgraded from regular solids to Dys 1 (puree) solids. Towards end of first week, patient did start to demonstrate improvements overall and so anticipate he will be able to achieve goals in second week.   Intensity: Minumum of 1-2 x/day, 30 to 90 minutes Frequency: 3 to 5 out of 7 days Duration/Length of Stay: 6/25 Treatment/Interventions: Cognitive remediation/compensation;Patient/family education;Speech/Language facilitation   Daily Session  Skilled Therapeutic Interventions: Patient seen for skilled ST session focusing on dysphagia and speech-language cognitive goals. Patient in tilt WC and was alert and receptive to treatment session. In addition, student observer in room. Patient took sips of thin liquids, initially exhibiting brief coughing episode that required SLP to take water as patient trying to drink more while coughing. He did not exhibit any other coughing or throat clearing. Patient able to achieve adequate voicing at one-word level but this was inconsistent and required maxA cues to achieve. He performed task of sequencing three picture actions and initially was 100% correct, however started to decline in accuracy and ended up being 70% accurate overall. Patient described function/features of object words for SLP to guess, and initially performed well, generally at one word level, such as for word 'banana' saying, "potassium", however his accuracy in this task declined  as it progressed. Patient required frequent redirection cues as he was very fidgety with his left arm splint. He continues to benefit from skilled SLP intervention to maximize cognitive-linguistic, speech and swallow function prior to discharge.     General    Pain Pain Assessment Pain  Scale: 0-10 Faces Pain Scale: No hurt  Therapy/Group: Individual Therapy   Sonia Baller, MA, CCC-SLP Speech Therapy

## 2021-04-22 NOTE — Progress Notes (Signed)
Physical Therapy Weekly Progress Note  Patient Details  Name: Brek Reece MRN: [id number] Date of Birth: 08-Aug-1961  Beginning of progress report period: April 15, 2021 End of progress report period: April 22, 2021  Today's Date: 04/22/2021 PT Individual Time: 5465-6812 and 7517-0017 PT Individual Time Calculation (min): 39 min and 62 min  Patient has met 2 of 3 short term goals.  Mr. Blankenhorn has demonstrated increased alertness the past 2 days allowing increased active participation in therapy sessions; however, he started CIR stay with increased lethargy and fluctuating levels of arousal limiting participation until yesterday. He has extensive L UE flexor tone and L LE extensor tone (UE more hypertonic) requiring focus on bracing and stretching for tone management to improve pt's positioning and posture to allow increased independence with functional mobility tasks. He has advanced to being able to roll L with min assist using bed features, roll R max assist, transition supine<>sitting with heavy max to total assist, perform squat pivot transfers with +2 max assist, and perform sit<>stands with +2 max assist. 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 He also demos excessive trunk/head flexed posturing with downward gaze in sitting and standing with limited ability to achieve upright, extended posture. He also demos poor focused and sustained attentions to tasks requiring max cuing for improvement. He will benefit from continued CIR level therapies to further advance independence with functional mobility and decrease caregiver burden.  Patient continues to demonstrate the following deficits muscle weakness, muscle joint tightness and muscle paralysis, decreased cardiorespiratoy endurance, impaired timing and sequencing, abnormal tone, unbalanced muscle activation and decreased motor planning,  decreased visual motor skills, decreased midline orientation and decreased attention to left, decreased initiation, decreased attention, decreased awareness, decreased problem solving, decreased safety awareness, decreased memory and delayed processing and decreased sitting balance, decreased standing balance, decreased postural control, hemiplegia and decreased balance strategies and therefore will continue to benefit from skilled PT intervention to increase functional independence with mobility.  Patient progressing toward long term goals..  Continue plan of care.  PT Short Term Goals Week 1:  PT Short Term Goal 1 (Week 1): Patient will roll B with no more than MaxA PT Short Term Goal 1 - Progress (Week 1): Met PT Short Term Goal 2 (Week 1): Patient will complete sit <> supine with no more than ModA PT Short Term Goal 2 - Progress (Week 1): Progressing toward goal PT Short Term Goal 3 (Week 1): Patient will complete sit <> stand with LRAD and +2 MaxA safely PT Short Term Goal 3 - Progress (Week 1): Met Week 2:  PT Short Term Goal 1 (Week 2): Pt will perform supine<>sit with mod assist of 1 PT Short Term Goal 2 (Week 2): Pt will perform sit<>stand with +2 mod assist PT Short Term Goal 3 (Week 2): Pt will perform bed<>chair transfers with mod assist of 1 PT Short Term Goal 4 (Week 2): Pt will initiate gait training  Skilled Therapeutic Interventions/Progress Updates:  Ambulation/gait training;Discharge planning;Functional mobility training;Psychosocial support;Therapeutic Activities;Visual/perceptual remediation/compensation;Balance/vestibular training;Disease management/prevention;Neuromuscular re-education;Skin care/wound management;Therapeutic Exercise;Wheelchair propulsion/positioning;Cognitive remediation/compensation;DME/adaptive equipment instruction;Pain management;Splinting/orthotics;UE/LE Strength taining/ROM;Community reintegration;Functional electrical stimulation;Patient/family  education;Stair training;UE/LE Coordination activities   Session 1: Pt received supine in bed awake and agreeable to therapy session. Pt demos some restlessness/fidgeting with R hemi-body throughout session. Pt has extensivey increased tone noted in L UE (flexor) and L LE (extensor) this AM - RN  present towards end of session to provide scheduled morning medication to help with tone management. Requires total assist to bring L LE into hip/knee flexion while supine - noted some motor planning impairments with pt overactivating quads as opposed to activating hamstrings when cued to flex LE - also noted some reversing in tone from extensor pattern to flexor pattern when therapist would provide manual facilitation to flex LE. MD in/out for morning assessment and discussed tone.   In supine, donned LB clothing with max/total assist for threading on LEs, pt able to lift L LE in extended pattern to assist with threading - pt continues to require cuing for attention to L hemibody during clothing management - therapist provided total manual facilitation to place L LE into hooklying position then cued pt to bridge and lift hips while pulling pants up, requires max cuing and mod assist to pull up L side. Performed supine lower trunk rotations in hooklying position to facilitate decreased tone with minimal change noted. Supine>sitting R EOB, HOB slightly elevated and bedrail available, with max assist of 1 - cued for logroll technique to allow LEs to stay in flexed position and improve pt's ability to use R UE to assist with trunk upright - assist to bring trunk forward while coming to sit to prevent posterior LOB. Sitting EOB, with mod assist for balance due to repeated posterior lean/LOB while donning clean shirt with mod assist. Donned shoes total assist with cuing to sustain anterior trunk lean/weight shift during this task as pt tends to have posterior LOB.  L squat pivot EOB>TIS w/c with max assist of 1 for  lifting/pivoting hips, blocking L LE extensor tone to maintain foot on ground, and facilitation for anterior trunk lean/weight shift. Pt left tilted back in TIS w/c with needs in reach, seat belt alarm on, and RN present to assume care of patient.  Session 2: Pt received sitting in TIS w/c at nurses station and pt agreeable to therapy session. Pt wearing both L UE elbow and wrist/hand splints - noted pt's fingers drawing up into MCP extension, PIP flexion, and DIP extension due to flexor tone - notified OT. Removed UE braces and assessed skin with minor, blanchable redness noted in bicep region, around olecranon/elbow, and at forearm where pt's arm makes contact with the braces. R squat pivot TIS w/c>EOB with heavy mod assist of 1 (+2 for safety) for lifting/pivoting hips and facilitating anterior trunk lean while blocking L LE extensor tone to maintain L foot planted firmly on the ground. Overall, pt continuing to have extensively increased flexor tone in L UE and extensor tone in L LE though minimally improved compared to this AM before medication administration. Sit>R sidelying with max cuing for pt to transition onto R elbow and stay on R side because when pt starts to go directly to supine he has excessive trunk extension causing his head to go towards opposite side of the bed towards bedrails and makes L LE tone increase with difficulty assisting pt to place LEs onto bed therefore max cuing with manual facilitation to go into R sidelying and keep trunk flexed forwards while keeping LEs into flexed position then going from sidelying>supine.   Pt noted to be incontinent of bladder and bowels. Rolling L with min assist using bedrails and rolling R with max assist for facilitating L LE into hooklying/flexed position due to strong extensor tone then max assist for rolling - total assist LB clothing management and peri-care. Supine>sitting R EOB via logroll technique  to decrease L LE tone and improve pt's  independence with task - max facilitation to place L LE into flexed position, max assist for rolling, then heavy mod assist for trunk upright. Sitting EOB with intermittent min assist while pt doffed shirt with max cuing for sequencing and attention to L side. Tolerated sitting EOB ~30mnutes targeting midline orientation and sitting balance with CGA for safety during static sitting - no significant pushing noted at this time - but continues to require up to total assist during dynamic sitting due to posterior LOB. Returned to supine as described above with poor recall/carryover of sequencing.  Addressed L LE extensor tone by stretching into ankle DF and eversion then placed dynamic night splint. Addressed L UE flexor tone by stretching towards elbow extension, wrist extension, and finger extension then re-donned elbow and hand/wrist splint. Pt left supine in bed with needs in reach, bed alarm on, pillows positioned to support L UE in braces, and telesitter in place.   Therapy Documentation Precautions:  Precautions Precautions: Fall Precaution Comments: severe L hemi tone, R hemi dyskinesia (?), Pushing L, aphasia Restrictions Weight Bearing Restrictions: No  Pain:   Session 1: No complaints of pain during session. Reports he didn't wear his L UE or LE braces/splints last night because they are not comfortable - educated pt on importance of will assess fit of braces in afternoon session.  Session 2: Reports some pain/discomfort (unrated) when wearing L UE and L LE positioning braces - adjusted for improved comfort and educated nursing to doff them by end of shift to allow patient time out of the braces this evening.  Therapy/Group: Individual Therapy  CTawana Scale, PT, DPT, CSRS  04/22/2021, 7:48 AM

## 2021-04-22 NOTE — Patient Care Conference (Signed)
Inpatient RehabilitationTeam Conference and Plan of Care Update Date: 04/22/2021   Time: 10:43 AM    Patient Name: Howard White      Medical Record Number: [id number]  Date of Birth: [birth year] Sex: Male         Room/Bed: 4W17C/4W17C-01 Payor Info: Payor: [employer] / Plan: [employer] PPO / Product Type: *No Product type* /    Admit Date/Time:  04/14/2021  2:42 PM  Primary Diagnosis:  Acute stroke of basal ganglia [facility])  Hospital Problems: Principal Problem:   Acute stroke of basal ganglia [facility])    Expected Discharge Date: Expected Discharge Date: 05/09/21  Team Members Present: Physician leading conference: Dr. Alysia Penna Care Coodinator Present: Dorien Chihuahua, RN, BSN, CRRN;Becky Dupree, LCSW Nurse Present: Dorien Chihuahua, RN PT Present: Page Spiro, PT OT Present: Clyda Greener, OT SLP Present: Nadara Mode, SLP PPS Coordinator present : Gunnar Fusi, SLP     Current Status/Progress Goal Weekly Team Focus  Bowel/Bladder   Patient is incontinent of bladder and bowels. LBM 04/21/21  Patient will regain B/B continence.  Scheduled toileting   Swallow/Nutrition/ Hydration   Patient is on D1 diet, can tolerate thin liquids and pills crushed in apple sauce. Needs more time to swallow.  Patient will be free from aspiration  adhere to diet restriction and assist patient with meals and drinking in a propped up position.   ADL's   Mod assist for UB bathing with max assist for UB dressing.  He needs max assist for LB selfcare sit to stand.  Increased tone in the LUE elbow, wrist, and hand.  mod to max assist  selfcare retraining, balance retraining, DME educaiton, splinting, therapeutic exercise,  neuromuscular re-education, pt education   Mobility   min assist rolling L, max assist roling R, total assist supine>sit, max assist sit>supine, +2 max assist squat pivot transfers, +2 max assist sit<>stand, +2 mod assist static standing balance up to 30 seconds - not  yet progressed to gait training  min assist bed mobility, mod assist transfers, max assist gait  activity tolerance, bed mobility training, sitting balance/trunk control, pt/family education, transfer training, L LE tone management and NMR, standing tolerance and balance, midline orientation with decreased pushing   Communication   Max A speech intelligibility, max-mod A expressing wants/needs cohesively  Min-Mod A  speech intelligibility strategies and organized thought   Safety/Cognition/ Behavioral Observations  max A  Mod A  sustained attention, basic problem solving, recall and error awareness   Pain   Patient denies having any pain  Patient will remain pain free  Assess pain regularly and administer PRN meds as appropriate   Skin   Patient's skin is intact. Some redness in the inner sacrum  Maintain skin integrity  Keep patient's skin clean dry (especially incontinent) and assist repositioning in bed.     Discharge Planning:  Discharging back home with family to assist   Team Discussion: Patient more awake, interactive and improved fluid intake. MD addressing tone with botox and increased baclofen. Reinforce use of resting hand splint.  Patient remains incontinent of bowel and bladder with timed toileting. Motor planning issues, and pushing limit progress.  Patient on target to meet rehab goals: Currently total assist for squat pivot transfer, pushing when sitting. Max assist to hold head up with decreased spatial awareness. Able to complete static standing with support. Requires min assist for upper body bathing and max assist for upper body dressing and total assist for lower body care. Requires max assist  for basic yes/no questions. Discharge goals set for mod - max assist.   *See Care Plan and progress notes for long and short-term goals.   Revisions to Treatment Plan:  Downgraded diet for increased oral intake  Teaching Needs: Transfers, toileting, medications, etc.  Current  Barriers to Discharge: Decreased caregiver support, Home enviroment access/layout and Incontinence  Possible Resolutions to Barriers: Recommend lift for mobility at home Family education with mother    Medical Summary Current Status: somnolence improved , still with severe flexor tone LUE, moderate ext tone LLE, poor fluid intake  Barriers to Discharge: Nutrition means;Other (comments)  Barriers to Discharge Comments: severe spasticity inhibiting positioning Possible Resolutions to Barriers/Weekly Focus: cont IVF, inject Botox , increase baclofen   Continued Need for Acute Rehabilitation Level of Care: The patient requires daily medical management by a physician with specialized training in physical medicine and rehabilitation for the following reasons: Direction of a multidisciplinary physical rehabilitation program to maximize functional independence : Yes Medical management of patient stability for increased activity during participation in an intensive rehabilitation regime.: Yes Analysis of laboratory values and/or radiology reports with any subsequent need for medication adjustment and/or medical intervention. : Yes   I attest that I was present, lead the team conference, and concur with the assessment and plan of the team.   Dorien Chihuahua B 04/22/2021, 2:46 PM

## 2021-04-22 NOTE — Procedures (Addendum)
Xeomin Injection for spasticity using needle EMG guidance Lot [id number] Exp 2022-12  Dilution: 50 Units/ml Indication: Severe spasticity which interferes with ADL,mobility and/or  hygiene and is unresponsive to medication management and other conservative care Informed consent was obtained after describing risks and benefits of the procedure with the patient. This includes bleeding, bruising, infection, excessive weakness, or medication side effects. A REMS form is on file and signed. Needle: 25g 1.5" needle  Number of units per muscle  Biceps75 Brachialis 50 FCR50  FDS50 FDP50 FPL25  All injections were done after obtaining appropriate EMG activity and after negative drawback for blood. The patient tolerated the procedure well. Post procedure instructions were given. A followup appointment was made.

## 2021-04-22 NOTE — Progress Notes (Signed)
PROGRESS NOTE   Subjective/Complaints:   Awake and alert , recognizes me, per PT LE extensor tone is worse before spasticity meds   ROS- limited by cognition  Objective:   DG Chest 1 View  Result Date: 04/20/2021 CLINICAL DATA:  Aspiration of food. EXAM: CHEST  1 VIEW COMPARISON:  Radiograph 04/16/2021 FINDINGS: The cardiomediastinal contours are normal. Similar streaky left basilar opacities to prior. Pulmonary vasculature is normal. No pleural effusion or pneumothorax. No acute osseous abnormalities are seen. IMPRESSION: Similar streaky left basilar opacities to prior, atelectasis or can be seen in the setting of aspiration. Electronically Signed   By: Keith Rake M.D.   On: 04/20/2021 21:30   Recent Labs    04/20/21 0653  WBC 6.0  HGB 13.8  HCT 40.7  PLT 258   Recent Labs    04/20/21 0653  NA 142  K 3.8  CL 110  CO2 27  GLUCOSE 92  BUN 22*  CREATININE 0.85  CALCIUM 8.8*    Intake/Output Summary (Last 24 hours) at 04/22/2021 0827 Last data filed at 04/22/2021 0759 Gross per 24 hour  Intake 960 ml  Output --  Net 960 ml        Physical Exam: Vital Signs Blood pressure 109/70, pulse (!) 49, temperature 98.4 F (36.9 C), resp. rate 18, height '5\' 10"'  (1.778 m), weight 79.6 kg, SpO2 97 %.   General: No acute distress Mood and affect are appropriate Heart: Regular rate and rhythm no rubs murmurs or extra sounds Lungs: Clear to auscultation, breathing unlabored, no rales or wheezes Abdomen: Positive bowel sounds, soft nontender to palpation, nondistended Extremities: No clubbing, cyanosis, or edema Skin: No evidence of breakdown, no evidence of rash  Neurologic:Motor 0/5 LUE, trace hip/knee ext synergy LLE Tone  MAS 4 in Left biceps, wrist and finger flexors , MAS 3 Left knee ext tone  Musculoskeletal: reduced Left elbow ext due to tone, wrist and fingers in flexed position on L side unchanged     Assessment/Plan: 1. Functional deficits which require 3+ hours per day of interdisciplinary therapy in a comprehensive inpatient rehab setting.  Physiatrist is providing close team supervision and 24 hour management of active medical problems listed below.  Physiatrist and rehab team continue to assess barriers to discharge/monitor patient progress toward functional and medical goals  Care Tool:  Bathing    Body parts bathed by patient: Chest,Abdomen,Face,Front perineal area   Body parts bathed by helper: Chest,Abdomen,Face,Right upper leg,Left upper leg Body parts n/a: Left lower leg,Right lower leg,Right arm,Left arm,Front perineal area,Buttocks   Bathing assist Assist Level: 2 Helpers     Upper Body Dressing/Undressing Upper body dressing   What is the patient wearing?: Pull over shirt    Upper body assist Assist Level: Maximal Assistance - Patient 25 - 49%    Lower Body Dressing/Undressing Lower body dressing      What is the patient wearing?: Pants     Lower body assist Assist for lower body dressing: Maximal Assistance - Patient 25 - 49%     Toileting Toileting    Toileting assist Assist for toileting: 2 Helpers     Transfers Chair/bed transfer  Transfers  assist  Chair/bed transfer activity did not occur: Safety/medical concerns  Chair/bed transfer assist level: Maximal Assistance - Patient 25 - 49% (squat pivot)     Locomotion Ambulation   Ambulation assist   Ambulation activity did not occur: Safety/medical concerns          Walk 10 feet activity   Assist  Walk 10 feet activity did not occur: Safety/medical concerns        Walk 50 feet activity   Assist Walk 50 feet with 2 turns activity did not occur: Safety/medical concerns         Walk 150 feet activity   Assist Walk 150 feet activity did not occur: Safety/medical concerns         Walk 10 feet on uneven surface  activity   Assist Walk 10 feet on uneven  surfaces activity did not occur: Safety/medical concerns         Wheelchair     Assist Will patient use wheelchair at discharge?: Yes Type of Wheelchair: Manual Wheelchair activity did not occur: Safety/medical concerns         Wheelchair 50 feet with 2 turns activity    Assist    Wheelchair 50 feet with 2 turns activity did not occur: Safety/medical concerns       Wheelchair 150 feet activity     Assist  Wheelchair 150 feet activity did not occur: Safety/medical concerns       Blood pressure 109/70, pulse (!) 49, temperature 98.4 F (36.9 C), resp. rate 18, height '5\' 10"'  (1.778 m), weight 79.6 kg, SpO2 97 %.    Medical Problem List and Plan: 1.L hemiplegia worsening due to recurrent R CR/BG infarct, has associated spasticity and contracture formation- spasticity has worsened significantly with new infarct , cognitive status declined Continue CIR PT, OT, SLP, speech dysarthric hypophonic and delayed Has features of cortical lesion although scan showing subcortical involvement will discuss with neuro - improvement with cognition  Team conference today please see physician documentation under team conference tab, met with team  to discuss problems,progress, and goals. Formulized individual treatment plan based on medical history, underlying problem and comorbidities.  2. Antithrombotics: -DVT/anticoagulation:Pharmaceutical:Lovenox -antiplatelet therapy: DAPT x3 weeks followed by ASA alone. 3. Pain Management:Continue Tylenol as needed 4. Depression:LCSW to follow for evaluation and support. Increase Lexapro to 76m, continue -antipsychotic agents: N/AA 5. Neuropsych: This patientis not fullycapable of making decisions on hisown behalf. Neurostim with amantadine check with team in am , may increase dose 6. Skin/Wound Care:Routine pressure-relief measures. 7. Fluids/Electrolytes/Nutrition:Monitor  intake/output. nl BUN/Cr 8.History of seizure disorder: Continue Lamictal twice daily 9.Left spastic hemiparesis: increase baclofen 15 mg 3 times daily- plan botox on Wed  10.History of anxiety disorder: Continue Lexapro. Hydroxyzine 3 times daily. 11.Insomnia: Continue melatonin at bedtime.Change trazodone to Seroquel as per neuro.  12.Hypothyroid: Stable on supplement. 13. Spasticity/forming contractures- this has significantly worsened since new CVA, increase baclofen to 19mQID, monitor sedation, Botox today  14. Leukocytosis without fever-resolved  15. Elevated LFTs: d/c Lipitor 16. AKI: continue IVF fluid intakeremains poor  But is able to take thin liquids , drinking a little more  17.  Dysphagia- poor posture in bed may need to get [facility] LOS: 8 days A FACE TO FACE EVALUATION WAS PERFORMED  AnCharlett Blake/06/2021, 8:27 AM

## 2021-04-22 NOTE — Progress Notes (Signed)
Occupational Therapy Weekly Progress Note  Patient Details  Name: Howard White MRN: [id number] Date of Birth: [birth year]  Beginning of progress report period: April 15, 2021 End of progress report period: April 22, 2021  Today's Date: 04/22/2021 OT Individual Time: 0901-1002 OT Individual Time Calculation (min): 61 min    Patient has met 2 of 4 short term goals.  Mr. Hafley is continuing to make slower progress than originally expected with OT.  He currently continues to demonstrate increased flexor tone in the left elbow, wrist, and digits.  He is currently at a Brunnstrum stage II level in the shoulder and hand and cannot functionally use the LUE at this time secondary to the severity of the tone at a 3/4 on the Modified Ashworth Scale.  Resting hand splint and pre fabricated elbow extension splint are being fitted currently and wearing schedule will be decided.  He currently completes UB bathing in supported sitting with min assist.  He needs max assist for donning a pullover shirt.  Total assist +2 (pt 30%) was needed for all LB selfcare sit to stand.  Sitting balance is min assist statically with max assist dynamically with increased pushing to the left in sitting and standing.  Flexed head and trunk are also present with pt needing max facilitation to keep his head and trunk at neutral.  Currently he also needs total assist to total +2 for squat pivot transfers to the wheelchair.  Expressive and receptive deficits are still present as well with some motor planning deficits, requiring mod to max demonstrational cueing for sequencing through bathing tasks.  Over the past couple of days, his attention has improved since eval and he is able to attend and actively participate.  Hopeful with greater progress ongoing but overall goals are currently mod to max assist.  Feel he will continue to benefit from ongoing CIR level therapy at this time to continue progression with ADL tasks as well as continuing to  work on LUE positioning.    Patient continues to demonstrate the following deficits: muscle weakness, muscle joint tightness and muscle paralysis, impaired timing and sequencing, abnormal tone, unbalanced muscle activation, motor apraxia, decreased coordination and decreased motor planning, decreased midline orientation and decreased motor planning, decreased initiation, decreased attention, decreased awareness, decreased problem solving, decreased safety awareness and decreased memory and decreased sitting balance, decreased standing balance, decreased postural control, hemiplegia and decreased balance strategies and therefore will continue to benefit from skilled OT intervention to enhance overall performance with BADL and Reduce care partner burden.  Patient progressing toward long term goals..  Continue plan of care.  OT Short Term Goals Week 2:  OT Short Term Goal 1 (Week 2): Pt will complete rolling in the bed with max assist to the right and mod assist to the left to assist with toileting tasks and LB clothing management. OT Short Term Goal 2 (Week 2): Pt will tolerate hand splint for positioning of the LUE into slight extension. OT Short Term Goal 3 (Week 2): Pt will complete LB dressing sit to stand with max assist. OT Short Term Goal 4 (Week 2): Pt will complete UB dressing with mod assist.  Skilled Therapeutic Interventions/Progress Updates:    Session 1: ([id number]) Pt in tilt in space wheelchair to start session agreeable to completion of bathing and dressing tasks.  The Stedy was utilized for transfer to the tub bench from the wheelchair with total assist for sit to stand secondary to increased trunk and cervical flexion as well  as LUE and LLE hemiparesis.   He needed max assist sit to stand for removal of all clothing prior to shower.  He perseverated on rinsing himself with the hand held shower, requiring max demonstrational cueing to sequence to using the washcloth with soap applied to  it using hand over hand with the RUE secondary to decreased understanding of what therapist was trying to have him do.  He was able to maintain sitting balance during bathing at mod assist with cueing to take his right shoulder to the wall to help decrease left lean/pushing.  Max assist was needed for washing buttocks with right lateral lean as well as washing his feet.  Once he dried off, he was able to complete stand pivot transfer to the wheelchair with total assist stand pivot.  He needed max assist for advancement of the LLE and for stabilization of the left knee when stepping with the right.  He then worked on dressing sit to stand at the sink.  Total assist for donning brief and pants sit to stand with max assist for donning pullover shirt.  He needed total assist for socks and shoes to complete ADL tasks.  He was left sitting up in the tilt in space wheelchair with the call button and phone in reach.  Resting hand splint and elbow extension splint donned to assist with tone management in the LUE.  Resting hand splint was donned first with elbow splint donned on top.    Session 2: ([id number])  Pt in wheelchair with therapist in to check splint fit and look for any redness or abrasions after approximately 2 hrs of wear.  No noted redness in any area with MD in as well to apply botox to the elbow, wrist, and digit flexors.  Once complete therapist re-donned splints and pt was able to tolerate for another 3.5 hours before being removed by PT for session.  Educated nursing on application and fit of splints with return demonstration.  This will be passed on to night shift for donning and removal so wear schedule can be established and pt can wear overnight.  Will follow up with schedule tomorrow.     Therapy Documentation Precautions:  Precautions Precautions: Fall Precaution Comments: severe L hemi tone, R hemi dyskinesia (?), Pushing L, aphasia Restrictions Weight Bearing Restrictions:  No  Pain: Pain Assessment Pain Scale: Faces Pain Score: 0-No pain ADL: See Care Tool Section for some details of mobility and selfcare  Therapy/Group: Individual Therapy  Keisy Strickler OTR/L 04/22/2021, 12:59 PM

## 2021-04-23 NOTE — Progress Notes (Signed)
Patient ID: Howard White, male   DOB: Nov 24, 1960, 60 y.o.   MRN: [id number]  Patient approved by [facility] for custodial care services through [facility]. Patient will receive 20 hrs of custodial care at discharge.   [place], [place]

## 2021-04-23 NOTE — Progress Notes (Signed)
PROGRESS NOTE   Subjective/Complaints:  Discussed UE splint wearing schedule with OT  Pt also wearing night splint to prevent LLE inversion and plantar flexion at the ankle  No bruising post xeomin injection   ROS- limited by cognition  Objective:   No results found.  No results for input(s): WBC, HGB, HCT, PLT in the last 72 hours.  No results for input(s): NA, K, CL, CO2, GLUCOSE, BUN, CREATININE, CALCIUM in the last 72 hours.   Intake/Output Summary (Last 24 hours) at 04/23/2021 0736 Last data filed at 04/23/2021 0434 Gross per 24 hour  Intake 1200 ml  Output 200 ml  Net 1000 ml         Physical Exam: Vital Signs Blood pressure 121/74, pulse (!) 52, temperature 97.8 F (36.6 C), resp. rate 18, height 5\' 10"  (1.778 m), weight 79.6 kg, SpO2 97 %.   General: No acute distress Mood and affect are appropriate Heart: Regular rate and rhythm no rubs murmurs or extra sounds Lungs: Clear to auscultation, breathing unlabored, no rales or wheezes Abdomen: Positive bowel sounds, soft nontender to palpation, nondistended Extremities: No clubbing, cyanosis, or edema Skin: No evidence of breakdown, no evidence of rash  Neurologic:Motor 0/5 LUE, trace hip/knee ext synergy LLE Tone  MAS 4 in Left biceps, wrist and finger flexors , MAS 3 Left knee ext tone , MAS 3 at Left ankle inverters Musculoskeletal: reduced Left elbow ext due to tone, wrist and fingers in flexed position on L side unchanged    Assessment/Plan: 1. Functional deficits which require 3+ hours per day of interdisciplinary therapy in a comprehensive inpatient rehab setting. Physiatrist is providing close team supervision and 24 hour management of active medical problems listed below. Physiatrist and rehab team continue to assess barriers to discharge/monitor patient progress toward functional and medical goals  Care Tool:  Bathing    Body parts bathed by  patient: Chest, Abdomen, Left arm, Front perineal area, Right upper leg, Left upper leg   Body parts bathed by helper: Buttocks, Left lower leg, Right lower leg, Right arm Body parts n/a: Left lower leg, Right lower leg, Right arm, Left arm, Front perineal area, Buttocks   Bathing assist Assist Level: Moderate Assistance - Patient 50 - 74% (sitting only in the shower, no standing)     Upper Body Dressing/Undressing Upper body dressing   What is the patient wearing?: Pull over shirt    Upper body assist Assist Level: Maximal Assistance - Patient 25 - 49%    Lower Body Dressing/Undressing Lower body dressing      What is the patient wearing?: Pants, Incontinence brief     Lower body assist Assist for lower body dressing: 2 Helpers     Toileting Toileting    Toileting assist Assist for toileting: 2 Helpers     Transfers Chair/bed transfer  Transfers assist  Chair/bed transfer activity did not occur: Safety/medical concerns  Chair/bed transfer assist level: Maximal Assistance - Patient 25 - 49% (squat pivot)     Locomotion Ambulation   Ambulation assist   Ambulation activity did not occur: Safety/medical concerns          Walk 10 feet activity  Assist  Walk 10 feet activity did not occur: Safety/medical concerns        Walk 50 feet activity   Assist Walk 50 feet with 2 turns activity did not occur: Safety/medical concerns         Walk 150 feet activity   Assist Walk 150 feet activity did not occur: Safety/medical concerns         Walk 10 feet on uneven surface  activity   Assist Walk 10 feet on uneven surfaces activity did not occur: Safety/medical concerns         Wheelchair     Assist Will patient use wheelchair at discharge?: Yes Type of Wheelchair: Manual Wheelchair activity did not occur: Safety/medical concerns         Wheelchair 50 feet with 2 turns activity    Assist    Wheelchair 50 feet with 2 turns  activity did not occur: Safety/medical concerns       Wheelchair 150 feet activity     Assist  Wheelchair 150 feet activity did not occur: Safety/medical concerns       Blood pressure 121/74, pulse (!) 52, temperature 97.8 F (36.6 C), resp. rate 18, height 5\' 10"  (1.778 m), weight 79.6 kg, SpO2 97 %.    Medical Problem List and Plan: 1.  L hemiplegia worsening due to recurrent R CR/BG infarct, has associated spasticity and contracture formation - spasticity has worsened significantly with new infarct , cognitive status declined          Continue CIR PT, OT, SLP, speech dysarthric hypophonic and delayed Appreciate neuro input          ELOS 6/25- goals at least mod A goals  2.  Antithrombotics: -DVT/anticoagulation:  Pharmaceutical: Lovenox             -antiplatelet therapy: DAPT x3 weeks followed by ASA alone. 3. Pain Management: Continue Tylenol as needed 4. Depression: LCSW to follow for evaluation and support. Increase Lexapro to 20mg , continue             -antipsychotic agents: N/AA 5. Neuropsych: This patient is not fully capable of making decisions on his own behalf. Neurostim with amantadine check with team in am , may increase dose 6. Skin/Wound Care: Routine pressure-relief measures. 7. Fluids/Electrolytes/Nutrition: Monitor intake/output.  nl BUN/Cr 8.  History of seizure disorder: Continue Lamictal twice daily 9.  Left spastic hemiparesis: increase baclofen 15 mg 3 times daily-  Had incobotulinum toxin A injection 300U on 04/22/21 to elbow flxors, wrist and finger flexors  10.  History of anxiety disorder: Continue Lexapro.  Hydroxyzine 3 times daily. 11.  Insomnia: Continue melatonin at bedtime.Change trazodone to Seroquel as per neuro.  12.  Hypothyroid: Stable on supplement.  13. Leukocytosis without fever-resolved  14. Elevated LFTs: d/c Lipitor 15. AKI: continue IVF fluid intakeremains poor  But is able to take thin liquids , drinking a little more  16.   Dysphagia- poor posture in bed may need to get in [facility] to eat LOS: 9 days A FACE TO FACE EVALUATION WAS PERFORMED  Charlett Blake 04/23/2021, 7:36 AM

## 2021-04-23 NOTE — Progress Notes (Signed)
Occupational Therapy Session Note  Patient Details  Name: Howard White MRN: [id number] Date of Birth: [birth year]  Today's Date: 04/23/2021 OT Individual Time: 6203-5597 OT Individual Time Calculation (min): 72 min    Short Term Goals: Week 2:  OT Short Term Goal 1 (Week 2): Pt will complete rolling in the bed with max assist to the right and mod assist to the left to assist with toileting tasks and LB clothing management. OT Short Term Goal 2 (Week 2): Pt will tolerate hand splint for positioning of the LUE into slight extension. OT Short Term Goal 3 (Week 2): Pt will complete LB dressing sit to stand with max assist. OT Short Term Goal 4 (Week 2): Pt will complete UB dressing with mod assist.  Skilled Therapeutic Interventions/Progress Updates:    Pt in bed to start session with his mother present and pt eating lunch.  He finished as his mother was leaving and was agreeable to working on dressing tasks.  Max assist for rolling to the right side of the bed with max assist for flexing the left knee and hip to assist with rolling.  He transitioned to sitting with mod assist.  Worked on dressing tasks EOB with mod to max assist for sitting balance.  Increased posterior lean with increased pushing to the left and posterior pelvic tilt.  He continues to fidget with the RUE constantly, changing position every few seconds with hand placed on the EOB and cued throughout session to relax and place in his lap, in order to help decrease pushing.  He demonstrates decreased ability to relax the hand to refrain from pushing himself to the left.  Increased extensor tone is noted in the left knee as well with therapist having to assist with flexing it back in order to place the LLE on the floor.  He continues to try to push into the floor with the RLE propelling himself backwards and demonstrates decreased ability to relax the leg and flex his trunk forward to midline when dividing attention between his balance and  donning clothing.  Max assist for UB dressing with total assist for LB dressing sit to stand.  Increased cervical flexion also noted in sitting with max assist to keep his head extended to neutral for more than 5 seconds.  Once dressed he completed squat pivot transfer to the tilt in space wheelchair at max assist level.  Therapist then completed stretching and massage to the left biceps to help increase PROM into extension.  Stretching was also completed  at the wrist flexors and digit flexors with resting hand splint donned at the end as well as elbow extension splint.  He was left sitting up in the wheelchair with nursing present at end of session and call button in hand.  Therapy Documentation Precautions:  Precautions Precautions: Fall Precaution Comments: severe L hemi tone, R hemi dyskinesia (?), Pushing L, aphasia Restrictions Weight Bearing Restrictions: No  Pain: Pain Assessment Pain Scale: Faces Faces Pain Scale: Hurts a little bit Pain Type: Acute pain Pain Location: Arm Pain Orientation: Left Pain Descriptors / Indicators: Discomfort;Restless Pain Onset: With Activity Pain Intervention(s): Repositioned;Massage ADL: See Care Tool Section for some details of mobility and selfcare   Therapy/Group: Individual Therapy  Howard White OTR/L 04/23/2021, 2:47 PM

## 2021-04-23 NOTE — Progress Notes (Signed)
Speech Language Pathology Daily Session Note  Patient Details  Name: Howard White MRN: [id number] Date of Birth: 09/02/1961  Today's Date: 04/23/2021 SLP Individual Time: 4944-9675 SLP Individual Time Calculation (min): 40 min  Short Term Goals: Week 2: SLP Short Term Goal 1 (Week 2): Patient will tolerate trials of upgraded solids textures (Dys 2 and Dys 3) with modA cues for effective mastication and oral clearance. SLP Short Term Goal 2 (Week 2): Patient will achieve adequate voicing at 1-2 word level with maxA cues. SLP Short Term Goal 3 (Week 2): Patient will demonstrate sustained attention to tasks for increments of 2-3 minutes with modA cues to redirect. SLP Short Term Goal 4 (Week 2): Patient will follow 1-step functional instructions with modA cues. SLP Short Term Goal 5 (Week 2): Patient will respond to open-ended questions and/or describe at 2-3 word phrase level with maxA for intelligibility.  Skilled Therapeutic Interventions:   Patient seen for skilled ST session focusing on cognitive-linguistic goals. When SLP arrived, patient in bed and appearing very restless. When asked he said he felt "terrible". SLP observed pillowcase damp likely with sweat and patient did request to have his brief changed. SLP doffed brief, which did not appear to be significantly wet. Patient was able to roll to left for SLP to don brief, however even with SLP assistance, he was not able to adequately roll to right to complete donning of brief. SLP was able to get patient to relax for a couple seconds with relaxed breathing and cues to close his eyes, however he was not able to maintain this state and continued to be restless, pushing and trying to reposition self in bed. He intermittently was able to verbalize at adequate vocal intensity to be understood, "When do I have Jeneen Rinks" (OT). SLP discussed patient with PT who had him scheduled immediately following ST session. She reported that patient had bladder scan  and bladder was very full so he required catheterization. Patient continues to benefit from skilled SLP intervention to maximize cognitive-linguistic, speech and swallow function prior to discharge.  Pain Pain Assessment Pain Scale: Faces Faces Pain Scale: Hurts a little bit Pain Type: Acute pain Pain Location: Other (Comment) (difficult to determine, patient very restless in bed and said he felt "terrible") Pain Descriptors / Indicators: Discomfort;Restless Pain Onset: On-going Pain Intervention(s): Other (Comment) (PT made aware, who informed RN)  Therapy/Group: Individual Therapy  Sonia Baller, MA, CCC-SLP Speech Therapy

## 2021-04-23 NOTE — Progress Notes (Signed)
Physical Therapy Session Note  Patient Details  Name: Howard White MRN: [id number] Date of Birth: Apr 28, 1961  Today's Date: 04/23/2021 PT Individual Time: 1530-1615 PT Individual Time Calculation (min): 45 min   Short Term Goals:  Week 2:  PT Short Term Goal 1 (Week 2): Pt will perform supine<>sit with mod assist of 1 PT Short Term Goal 2 (Week 2): Pt will perform sit<>stand with +2 mod assist PT Short Term Goal 3 (Week 2): Pt will perform bed<>chair transfers with mod assist of 1 PT Short Term Goal 4 (Week 2): Pt will initiate gait training Week 3:     Skilled Therapeutic Interventions/Progress Updates:   Pt received sitting in WC and agreeable to PT. Pt transported to ortho gym in [facility]. Standing balance in Standing frame x 5 minutes with min assist for improved midline orientation. Pt then performed standing balance while engaged in fine motor task of reaching for peg and placing on board mod assist on second bout to allow lateral reach to the R. Prolonged rest breaks between bouts. Required   Pt returned to room and performed squat pivot transfer to bed with mod assist and +2 for safety. Pt doffed shoes EOB with min-mod assist for balance and min assist for positioning the LLE.  Sit>supine completed with mod assist and left supine in bed with call bell in reach and all needs met.       Therapy Documentation Precautions:  Precautions Precautions: Fall Precaution Comments: severe L hemi tone, R hemi dyskinesia (?), Pushing L, aphasia Restrictions Weight Bearing Restrictions: No  Vital Signs: Therapy Vitals Temp: (!) 97.4 F (36.3 C) Temp Source: Oral Pulse Rate: 94 Resp: 18 BP: 126/85 Patient Position (if appropriate): Sitting Oxygen Therapy SpO2: 95 % O2 Device: Room Air Pain: Pain Assessment Pain Scale: Faces Faces Pain Scale: Hurts a little bit Pain Type: Acute pain Pain Location: Arm Pain Orientation: Left Pain Descriptors / Indicators: Discomfort;Restless Pain  Onset: With Activity Pain Intervention(s): Repositioned;Massage     Therapy/Group: Individual Therapy  Lorie Phenix 04/23/2021, 4:19 PM

## 2021-04-23 NOTE — Progress Notes (Signed)
Physical Therapy Session Note  Patient Details  Name: Howard White MRN: [id number] Date of Birth: [birth year]  Today's Date: 04/23/2021 PT Individual Time: 1025-1048 PT Individual Time Calculation (min): 23 min   and  Today's Date: 04/23/2021 PT Missed Time: 22 Minutes Missed Time Reason: Nursing care  Short Term Goals: Week 2:  PT Short Term Goal 1 (Week 2): Pt will perform supine<>sit with mod assist of 1 PT Short Term Goal 2 (Week 2): Pt will perform sit<>stand with +2 mod assist PT Short Term Goal 3 (Week 2): Pt will perform bed<>chair transfers with mod assist of 1 PT Short Term Goal 4 (Week 2): Pt will initiate gait training  Skilled Therapeutic Interventions/Progress Updates:    Pt received supine in bed and noticed to be drenched in sweat with his face flushed red and he was extremely restless (more than baseline). Pt unable to verbalize what is wrong due to aphasia and hypophonia; despite this, pt agreeable to therapy session. Pt reports need to use bathroom, provided urinal and for 15 minutes attempted to void several times with no success. Rolling L with mod assist during total assist brief management. Pt having increased L LE tone requiring +2 total assist to flex hip/knee and place into hooklying position for rolling R. Pt continued to having increased sweating and restlessness therefore notified nursing staff to come assess pt - therapist requested bladder scan with LPN reporting pt needs I&O cath due to 671mL. Pt left in care of nursing staff. Missed 22 minutes of skilled physical therapy.   Therapy Documentation Precautions:  Precautions Precautions: Fall Precaution Comments: severe L hemi tone, R hemi dyskinesia (?), Pushing L, aphasia Restrictions Weight Bearing Restrictions: No   Pain:  Pt not verbalizing pain though appears in distress or discomfort due to increased restlessness and sweating - nursing staff notified with details above.   Therapy/Group: Individual  Therapy  Tawana Scale , PT, DPT, CSRS 04/23/2021, 7:54 AM

## 2021-04-24 NOTE — Progress Notes (Signed)
PROGRESS NOTE   Subjective/Complaints:  Now drinking 900-1058ml per day  More alert  ROS- limited by cognition  Objective:   No results found.  No results for input(s): WBC, HGB, HCT, PLT in the last 72 hours.  No results for input(s): NA, K, CL, CO2, GLUCOSE, BUN, CREATININE, CALCIUM in the last 72 hours.   Intake/Output Summary (Last 24 hours) at 04/24/2021 0713 Last data filed at 04/23/2021 1745 Gross per 24 hour  Intake 960 ml  Output 875 ml  Net 85 ml         Physical Exam: Vital Signs Blood pressure 100/63, pulse (!) 54, temperature 98.2 F (36.8 C), resp. rate 19, height 5\' 10"  (1.778 m), weight 79.6 kg, SpO2 95 %.  General: No acute distress Mood and affect are appropriate Heart: Regular rate and rhythm no rubs murmurs or extra sounds Lungs: Clear to auscultation, breathing unlabored, no rales or wheezes Abdomen: Positive bowel sounds, soft nontender to palpation, nondistended Extremities: No clubbing, cyanosis, or edema Skin: No evidence of breakdown, no evidence of rash  Neurologic:Motor 0/5 LUE, trace hip/knee ext synergy LLE Tone  MAS 4 in Left biceps, wrist and finger flexors , MAS 3 Left knee ext tone , MAS 3 at Left ankle inverters Musculoskeletal: reduced Left elbow ext due to tone, wrist and fingers in flexed position on L side unchanged    Assessment/Plan: 1. Functional deficits which require 3+ hours per day of interdisciplinary therapy in a comprehensive inpatient rehab setting. Physiatrist is providing close team supervision and 24 hour management of active medical problems listed below. Physiatrist and rehab team continue to assess barriers to discharge/monitor patient progress toward functional and medical goals  Care Tool:  Bathing    Body parts bathed by patient: Chest, Abdomen, Left arm, Front perineal area, Right upper leg, Left upper leg   Body parts bathed by helper: Buttocks,  Left lower leg, Right lower leg, Right arm Body parts n/a: Left lower leg, Right lower leg, Right arm, Left arm, Front perineal area, Buttocks   Bathing assist Assist Level: Moderate Assistance - Patient 50 - 74% (sitting only in the shower, no standing)     Upper Body Dressing/Undressing Upper body dressing   What is the patient wearing?: Pull over shirt    Upper body assist Assist Level: Maximal Assistance - Patient 25 - 49%    Lower Body Dressing/Undressing Lower body dressing      What is the patient wearing?: Pants     Lower body assist Assist for lower body dressing: Total Assistance - Patient < 25% (sit to stand)     [place] for toileting: 2 Helpers     Transfers Chair/bed transfer  Transfers assist  Chair/bed transfer activity did not occur: Safety/medical concerns  Chair/bed transfer assist level: Total Assistance - Patient < 25% (squat pivot)     Locomotion Ambulation   Ambulation assist   Ambulation activity did not occur: Safety/medical concerns          Walk 10 feet activity   Assist  Walk 10 feet activity did not occur: Safety/medical concerns  Walk 50 feet activity   Assist Walk 50 feet with 2 turns activity did not occur: Safety/medical concerns         Walk 150 feet activity   Assist Walk 150 feet activity did not occur: Safety/medical concerns         Walk 10 feet on uneven surface  activity   Assist Walk 10 feet on uneven surfaces activity did not occur: Safety/medical concerns         Wheelchair     Assist Will patient use wheelchair at discharge?: Yes Type of Wheelchair: Manual Wheelchair activity did not occur: Safety/medical concerns         Wheelchair 50 feet with 2 turns activity    Assist    Wheelchair 50 feet with 2 turns activity did not occur: Safety/medical concerns       Wheelchair 150 feet activity     Assist  Wheelchair 150  feet activity did not occur: Safety/medical concerns       Blood pressure 100/63, pulse (!) 54, temperature 98.2 F (36.8 C), resp. rate 19, height 5\' 10"  (1.778 m), weight 79.6 kg, SpO2 95 %.    Medical Problem List and Plan: 1.  L hemiplegia worsening due to recurrent R CR/BG infarct, has associated spasticity and contracture formation - spasticity has worsened significantly with new infarct , cognitive status declined          Continue CIR PT, OT, SLP, speech dysarthric hypophonic and delayed Appreciate neuro input          ELOS 6/25- goals at least mod A goals  2.  Antithrombotics: -DVT/anticoagulation:  Pharmaceutical: Lovenox             -antiplatelet therapy: DAPT x3 weeks followed by ASA alone. 3. Pain Management: Continue Tylenol as needed 4. Depression: LCSW to follow for evaluation and support. Increase Lexapro to 20mg , continue             -antipsychotic agents: N/AA 5. Neuropsych: This patient is not fully capable of making decisions on his own behalf. Neurostim with amantadine check with team in am , may increase dose 6. Skin/Wound Care: Routine pressure-relief measures. 7. Fluids/Electrolytes/Nutrition: Monitor intake/output.  nl BUN/Cr 8.  History of seizure disorder: Continue Lamictal twice daily 9.  Left spastic hemiparesis: increase baclofen 15 mg 4 times daily-  Had incobotulinum toxin A injection 300U on 04/22/21 to elbow flexors, wrist and finger flexors  10.  History of anxiety disorder: Continue Lexapro.  Hydroxyzine 3 times daily. 11.  Insomnia: Continue melatonin at bedtime.Change trazodone to Seroquel as per neuro.  12.  Hypothyroid: Stable on supplement.  13. Leukocytosis without fever-resolved  14. Elevated LFTs: d/c Lipitor, f/u CMET on Monday  15. AKI: resolved , drinking a little more  16.  Dysphagia- poor posture in bed may need to get in [facility] to eat LOS: 10 days A FACE TO FACE EVALUATION WAS PERFORMED  Charlett Blake 04/24/2021, 7:13 AM

## 2021-04-24 NOTE — Progress Notes (Signed)
Physical Therapy Session Note  Patient Details  Name: Howard White MRN: [id number] Date of Birth: Sep 09, 1961  Today's Date: 04/24/2021 PT Individual Time: 1747-1595 PT Individual Time Calculation (min): 30 min   Short Term Goals:  Week 2:  PT Short Term Goal 1 (Week 2): Pt will perform supine<>sit with mod assist of 1 PT Short Term Goal 2 (Week 2): Pt will perform sit<>stand with +2 mod assist PT Short Term Goal 3 (Week 2): Pt will perform bed<>chair transfers with mod assist of 1 PT Short Term Goal 4 (Week 2): Pt will initiate gait training   Skilled Therapeutic Interventions/Progress Updates:   Pt received supine in bed and agreeable to PT. Rolling R and L to don/doff clean lower body clothing due to incontinent bladder.  Supine>sit transfer with max assist and cues for sequencing for proper log roll technique to achieve sitting EOB. Squat pivot transfer to [facility] with mod assist to WC. Donning shoes with mod-max assist sitting in WC with use of RUE. Pt transferred to rehab gym in [facility]. Sitting balance in WC to allow WC parts adjustment to increased ELR length to allow improved sitting tolerance and decreased pressure on the ischial tuberosities and prevent skin break down. Patient returned to RN station and left sitting in [facility] with  all needs met.           Therapy Documentation Precautions:  Precautions Precautions: Fall Precaution Comments: severe L hemi tone, R hemi dyskinesia (?), Pushing L, aphasia Restrictions Weight Bearing Restrictions: No   Vital Signs: Therapy Vitals Temp: 98 F (36.7 C) Temp Source: Oral Pulse Rate: 66 Resp: 18 BP: (!) 97/59 Patient Position (if appropriate): Lying Oxygen Therapy SpO2: 96 % O2 Device: Room Air Pain:   denies    Therapy/Group: Individual Therapy  Lorie Phenix 04/24/2021, 4:49 PM

## 2021-04-24 NOTE — Progress Notes (Signed)
Occupational Therapy Session Note  Patient Details  Name: Howard White MRN: [id number] Date of Birth: 22-Nov-1960  Today's Date: 04/24/2021 OT Individual Time: 1101-1200 OT Individual Time Calculation (min): 59 min    Short Term Goals: Week 1:  OT Short Term Goal 1 (Week 1): Pt will complete static sitting balance EOB with min assist in preparation for selfcare tasks. OT Short Term Goal 1 - Progress (Week 1): Met OT Short Term Goal 2 (Week 1): Pt will complete rolling in the bed with max assist to the right and mod assist to the left to assist with toileting tasks and LB clothing management. OT Short Term Goal 2 - Progress (Week 1): Not met OT Short Term Goal 3 (Week 1): Pt will tolerate hand splint for positioning of the LUE into slight extension. OT Short Term Goal 3 - Progress (Week 1): Not met OT Short Term Goal 4 (Week 1): Pt will complete UB bathing sitting EOB with no more than mod assist and mod instructional cueing for OT Short Term Goal 4 - Progress (Week 1): Met Week 2:  OT Short Term Goal 1 (Week 2): Pt will complete rolling in the bed with max assist to the right and mod assist to the left to assist with toileting tasks and LB clothing management. OT Short Term Goal 2 (Week 2): Pt will tolerate hand splint for positioning of the LUE into slight extension. OT Short Term Goal 3 (Week 2): Pt will complete LB dressing sit to stand with max assist. OT Short Term Goal 4 (Week 2): Pt will complete UB dressing with mod assist.   Skilled Therapeutic Interventions/Progress Updates:    Pt received in TIS w/c and consented to OT tx. Session focused on stretching LUE joints and attempted to place resting hand splint on pt. Therapist able to fit hand splint on pt after increased time for joint stretches, however once splint was on, pt began picking at it and wanted it off. Removed splint and continued PROM stretches to all joints during rest breaks this session. Pt instructed in standing  tolerance tasks in [place] plus, pt req 2 person assist for safety and to ensure BLEs and BUEs were in proper position. Cuing for upright posture. Instructed pt in functional reaching and [facility] activities while standing. After tx, pt wheeled back to room and left up in TIS w/c with seatbelt on, seatbelt alarm on, all needs met and family member present.    Therapy Documentation Precautions:  Precautions Precautions: Fall Precaution Comments: severe L hemi tone, R hemi dyskinesia (?), Pushing L, aphasia Restrictions Weight Bearing Restrictions: No   Pain: Pain Assessment Pain Scale: Faces Faces Pain Scale: No hurt   Therapy/Group: Individual Therapy  [name] 04/24/2021, 12:57 PM

## 2021-04-24 NOTE — Progress Notes (Signed)
Patient ID: Howard White, male   DOB: 04-Dec-1960, 60 y.o.   MRN: [id number]  Family education scheduled with family and caregivers on:  June 22, 23 and [place], [place]

## 2021-04-24 NOTE — Progress Notes (Signed)
Physical Therapy Session Note  Patient Details  Name: Howard White MRN: [id number] Date of Birth: November 16, 1960  Today's Date: 04/24/2021 PT Individual Time: 1735-6701 PT Individual Time Calculation (min): 71 min   Short Term Goals: Week 1:  PT Short Term Goal 1 (Week 1): Patient will roll B with no more than MaxA PT Short Term Goal 1 - Progress (Week 1): Met PT Short Term Goal 2 (Week 1): Patient will complete sit <> supine with no more than ModA PT Short Term Goal 2 - Progress (Week 1): Progressing toward goal PT Short Term Goal 3 (Week 1): Patient will complete sit <> stand with LRAD and +2 MaxA safely PT Short Term Goal 3 - Progress (Week 1): Met Week 2:  PT Short Term Goal 1 (Week 2): Pt will perform supine<>sit with mod assist of 1 PT Short Term Goal 2 (Week 2): Pt will perform sit<>stand with +2 mod assist PT Short Term Goal 3 (Week 2): Pt will perform bed<>chair transfers with mod assist of 1 PT Short Term Goal 4 (Week 2): Pt will initiate gait training  Skilled Therapeutic Interventions/Progress Updates:  Patient supine in bed on entrance to room. NT present and assisting pt with completion of breakfast and then cleaning of mouth. Patient alert and agreeable to PT session. Patient denied pain during session. Pt has been incontinent of urine and required bed level brief change. Wrist and arm brace have not yet been doffed as per donning/ doffing schedule provided by OT, so wrist and elbow splints are doffed and L hand washed. No signs of redness or areas of irritation.  Manual Therapy/ NMR: Time spent at start of session to decrease extensor tone in LLE to allow for improved positioning during transfers. Started distally and progressed proximally with cues provided for increased active flexion.  Therapeutic Activity: Bed Mobility: Patient performed roll several times toward L side with supervision and toward R side with Mod A requiring placement of LLE into hooklying flexed position  for improved roll to R side. Performs bridging x5 to assist with functional dressing tasks. Supine --> sit toward R side with Mod A to bring BLE off EOB and Max A to bring UB to upright seated position as pt tending to push posteriorly. VC/ tc required for increasing forward lean. Transfers: Patient performed squat pivot transfer toward R side into TIS w/c with Mod/ Max A requiring several efforts to complete to w/c seat. Provided vc/ tc throughout for maintaining forward lean, hand placement.  Neuromuscular Re-ed: NMR facilitated during session with focus on sitting balance. Pt guided in sitting tasks while seated EOB. Several LOB posteriorly requiring Max A to correct initially and improving to correcting with Min A. With block to LLE, pt is able to reach forward just outside of BOS to hit target. Unsteady with reaching to targets laterally but with encouragement, is able to reach just to edge of BOS. NMR performed for improvements in motor control and coordination, balance, sequencing, judgement, and self confidence/ efficacy in performing all aspects of mobility at highest level of independence.   Patient seated upright in TIS w/c at end of session with brakes locked, belt alarm set, and all needs within reach.  Therapy Documentation Precautions:  Precautions Precautions: Fall Precaution Comments: severe L hemi tone, R hemi dyskinesia (?), Pushing L, aphasia Restrictions Weight Bearing Restrictions: No  Therapy/Group: Individual Therapy  Alger Simons PT, DPT 04/24/2021, 12:49 PM

## 2021-04-24 NOTE — Progress Notes (Signed)
Speech Language Pathology Daily Session Note  Patient Details  Name: Howard White MRN: [id number] Date of Birth: [birth year]  Today's Date: 04/24/2021 SLP Individual Time: 0930-1015 SLP Individual Time Calculation (min): 45 min  Short Term Goals: Week 2: SLP Short Term Goal 1 (Week 2): Patient will tolerate trials of upgraded solids textures (Dys 2 and Dys 3) with modA cues for effective mastication and oral clearance. SLP Short Term Goal 2 (Week 2): Patient will achieve adequate voicing at 1-2 word level with maxA cues. SLP Short Term Goal 3 (Week 2): Patient will demonstrate sustained attention to tasks for increments of 2-3 minutes with modA cues to redirect. SLP Short Term Goal 4 (Week 2): Patient will follow 1-step functional instructions with modA cues. SLP Short Term Goal 5 (Week 2): Patient will respond to open-ended questions and/or describe at 2-3 word phrase level with maxA for intelligibility.  Skilled Therapeutic Interventions:   Patient seen for skilled ST session focusing on cognitive-linguistic and speech deficits. Patient alert and sitting in [facility] when SLP arrived. He was able to follow basic directions to play novel game Ines Bloomer) after SLP modeling and modA fading to min-modA cues. When SLP attempted to try a task involving him writing for word-guessing activity, he became fixated on his handwriting (which was very illegible) and perseverated on writing alphabet letters despite maxA cues from SLP. He then completed task of manipulating letters on flash cards to form words, requiring mod-maxA. Voice was very low in intensity and difficulty to understand even when context known. Patient continues to benefit from skilled SLP intervention to maximize cognitive-linguistic, speech and swallow function prior to discharge.  Pain Pain Assessment Pain Scale: Faces Faces Pain Scale: No hurt  Therapy/Group: Individual Therapy   Sonia Baller, MA, CCC-SLP Speech Therapy

## 2021-04-25 NOTE — Progress Notes (Signed)
Physical Therapy Session Note  Patient Details  Name: Howard White MRN: [id number] Date of Birth: 08/27/1961  Today's Date: 04/25/2021 PT Individual Time: 0905-1003 PT Individual Time Calculation (min): 58 min   Short Term Goals: Week 2:  PT Short Term Goal 1 (Week 2): Pt will perform supine<>sit with mod assist of 1 PT Short Term Goal 2 (Week 2): Pt will perform sit<>stand with +2 mod assist PT Short Term Goal 3 (Week 2): Pt will perform bed<>chair transfers with mod assist of 1 PT Short Term Goal 4 (Week 2): Pt will initiate gait training  Skilled Therapeutic Interventions/Progress Updates:    Pt received supine in bed and agreeable to therapy session. Noted to be incontinent of bladder - rolling L min assist, rolling R max assist (continues to have increased L LE extensor tone requiring max/total assist to flex hip/knee to place in hooklying position) - total assist peri-care. Donned shorts in supine with max assist to thread over LEs and pull over hips in hooklying. Supine bridging x12 reps focusing on muscle activation in flexed position due to extensor tone - demos good hip clearance but requires cues to go slower and allow increased ROM.  Supine>sitting R EOB, HOB flat but bedrail available with heavy max assist and +2 mod assist as pt demos increased pushing with R UE this AM - continues to have posterior trunk LOB on bed requiring pillows behind him to make this safe as he is unable to correct and sustain anterior trunk weight shift because pt perseverating on donning his shoes causing him to extend his LEs and worsen the posterior LOB. L squat pivot EOB>w/c with mod assist of 1 and +2 min assist for safety - requires manual facilitation for R anterior trunk lean/weight shift and L pelvic rotation into chair.  Transported to/from gym in w/c for time management and energy conservation. Block practice squat pivot w/c<>EOM x2 with mod assist of 1 in both directions - during transfer to the L  continues to require max manual facilitation for R antetrior trunk lean and during transfers to the R requires assist to block L knee and facilitate increased hip clearance.   Dynamic sitting balance on EOM focusing on improved trunk control and upright posture via R lateral trunk leans to reach and grasp a card then visually scan to midline to place card on its match - pt able to complete ~5 cards with close supervision/CGA then with fatigue pt would have worsening L posterior trunk LOB requiring max/total assist to maintain upright - provided supported seated rest break then continued task.  Sit>stand EOM>R UE support around +2 assist's shoulders with +2 mod/max assist to come to stand - once in standing able to maintain balance with max assist of 1 to block L knee and prevent L posterior LOB while pt completing reaching task to grasp horseshoe with R UE and then place on top of mirror to promote R weight shift and decrease pushing while promoting increased anterior weight shift and upright posture.  Gait training ~77ft x2 with +2 max assist for balance and 3rd person w/c follow (no w/c follow on 2nd walk as pt able to turn and sit on mat)- narrow BOS, increased tone in L LE requiring total assist to advance during swing and to prevent excessive adduction, max assist to block L knee during stance, facilitation for R/L weight shifting during stance as pt pushing with R LE causing excessive L weight shift.  Transported back to room and pt noted  to be incontinent of bladder again. R squat pivot w/c>EOB mod assist as described above. Sit>supine with mod assist of 1 as pt demos improved ability to follow commands and go into R sidelying as opposed to leaning backwards, which allowed therapist to assist with B LE management. Pt left R sidelying in bed in the care of NT.    Therapy Documentation Precautions:  Precautions Precautions: Fall Precaution Comments: severe L hemi tone, R hemi dyskinesia (?),  Pushing L, aphasia Restrictions Weight Bearing Restrictions: No   Pain:  Denies pain during session.     Therapy/Group: Individual Therapy  Tawana Scale , PT, DPT, CSRS 04/25/2021, 7:49 AM

## 2021-04-25 NOTE — Progress Notes (Signed)
Speech Language Pathology Daily Session Note  Patient Details  Name: Howard White MRN: [id number] Date of Birth: 1961-05-31  Today's Date: 04/25/2021 SLP Individual Time: 8099-8338 SLP Individual Time Calculation (min): 40 min  Short Term Goals: Week 2: SLP Short Term Goal 1 (Week 2): Patient will tolerate trials of upgraded solids textures (Dys 2 and Dys 3) with modA cues for effective mastication and oral clearance. SLP Short Term Goal 2 (Week 2): Patient will achieve adequate voicing at 1-2 word level with maxA cues. SLP Short Term Goal 3 (Week 2): Patient will demonstrate sustained attention to tasks for increments of 2-3 minutes with modA cues to redirect. SLP Short Term Goal 4 (Week 2): Patient will follow 1-step functional instructions with modA cues. SLP Short Term Goal 5 (Week 2): Patient will respond to open-ended questions and/or describe at 2-3 word phrase level with maxA for intelligibility.  Skilled Therapeutic Interventions: Skilled treatment session focused on speech and cognitive goals. Upon arrival, patient was extremely restless in bed and had been incontinent of urine. Patient able to assist in rolling during bed mobility but required Max verbal cues for sustained attention to task with verbal perseveration on specific topics despite attempts at redirection. Intelligibility was also moderately impaired requiring Mod verbal cues for use of a a slow rate and an increased vocal intensity. Patient had several large coughing episodes with thin liquids today per nursing, therefore, SLP facilitated trials of thin with a 5cc Provale cup. Patient able to handle the cup independently without s/s of aspiration noted. Patient also reported he was receiving the "perfect" amount. Due to patient's current restless, poor positioning and severe cognitive impairments, recommend patient utilize the provale cup to maximize safety with thin liquids. NT made aware and signs were placed above the bed.  Patient left reclined in bed with alarm on and relaxing medication music per his request. Continue with current plan of care.      Pain Pain Assessment Pain Scale: 0-10 Pain Score: 0-No pain  Therapy/Group: Individual Therapy  Howard White 04/25/2021, 12:09 PM

## 2021-04-25 NOTE — Progress Notes (Signed)
PROGRESS NOTE   Subjective/Complaints:  Constantly moving- kept trying to pull himself up in the bed- denies "any issues".    ROS- limited by cognition  Objective:   No results found.  No results for input(s): WBC, HGB, HCT, PLT in the last 72 hours.  No results for input(s): NA, K, CL, CO2, GLUCOSE, BUN, CREATININE, CALCIUM in the last 72 hours.   Intake/Output Summary (Last 24 hours) at 04/25/2021 1216 Last data filed at 04/25/2021 0821 Gross per 24 hour  Intake 600 ml  Output --  Net 600 ml        Physical Exam: Vital Signs Blood pressure 106/62, pulse 60, temperature 97.8 F (36.6 C), temperature source Oral, resp. rate 20, height 5\' 10"  (1.778 m), weight 79.6 kg, SpO2 97 %.    General: awake, alert, appropriate, constantly moving to pull self up in bed; but appears like cannot get comfortable, vs restless body syndrome?; NAD HENT: oropharynx moist-  CV: regular rate; no JVD Pulmonary: CTA B/L; no W/R/R- good air movement GI: soft, NT, ND, (+)BS Psychiatric:very flat Neurological: alert, but rare speech; aphasic Neurologic:Motor 0/5 LUE, trace hip/knee ext synergy LLE Tone  MAS 4 in Left biceps, wrist and finger flexors , MAS 3 Left knee ext tone , MAS 3 at Left ankle inverters Musculoskeletal: reduced Left elbow ext due to tone, wrist and fingers in flexed position on L side unchanged    Assessment/Plan: 1. Functional deficits which require 3+ hours per day of interdisciplinary therapy in a comprehensive inpatient rehab setting. Physiatrist is providing close team supervision and 24 hour management of active medical problems listed below. Physiatrist and rehab team continue to assess barriers to discharge/monitor patient progress toward functional and medical goals  Care Tool:  Bathing    Body parts bathed by patient: Chest, Abdomen, Left arm, Front perineal area, Right upper leg, Left upper leg    Body parts bathed by helper: Buttocks, Left lower leg, Right lower leg, Right arm Body parts n/a: Left lower leg, Right lower leg, Right arm, Left arm, Front perineal area, Buttocks   Bathing assist Assist Level: Moderate Assistance - Patient 50 - 74% (sitting only in the shower, no standing)     Upper Body Dressing/Undressing Upper body dressing   What is the patient wearing?: Pull over shirt    Upper body assist Assist Level: Maximal Assistance - Patient 25 - 49%    Lower Body Dressing/Undressing Lower body dressing      What is the patient wearing?: Pants     Lower body assist Assist for lower body dressing: Total Assistance - Patient < 25% (sit to stand)     Chartered loss adjuster assist Assist for toileting: 2 Helpers     Transfers Chair/bed transfer  Transfers assist  Chair/bed transfer activity did not occur: Safety/medical concerns  Chair/bed transfer assist level: Maximal Assistance - Patient 25 - 49%     Locomotion Ambulation   Ambulation assist   Ambulation activity did not occur: Safety/medical concerns          Walk 10 feet activity   Assist  Walk 10 feet activity did not occur: Safety/medical concerns  Walk 50 feet activity   Assist Walk 50 feet with 2 turns activity did not occur: Safety/medical concerns         Walk 150 feet activity   Assist Walk 150 feet activity did not occur: Safety/medical concerns         Walk 10 feet on uneven surface  activity   Assist Walk 10 feet on uneven surfaces activity did not occur: Safety/medical concerns         Wheelchair     Assist Will patient use wheelchair at discharge?: Yes Type of Wheelchair: Manual Wheelchair activity did not occur: Safety/medical concerns         Wheelchair 50 feet with 2 turns activity    Assist    Wheelchair 50 feet with 2 turns activity did not occur: Safety/medical concerns       Wheelchair 150 feet activity      Assist  Wheelchair 150 feet activity did not occur: Safety/medical concerns       Blood pressure 106/62, pulse 60, temperature 97.8 F (36.6 C), temperature source Oral, resp. rate 20, height 5\' 10"  (1.778 m), weight 79.6 kg, SpO2 97 %.    Medical Problem List and Plan: 1.  L hemiplegia worsening due to recurrent R CR/BG infarct, has associated spasticity and contracture formation - spasticity has worsened significantly with new infarct , cognitive status declined          Continue CIR PT, OT, SLP, speech dysarthric hypophonic and delayed Appreciate neuro input  Con't PT, OT and SLP          ELOS 6/25- goals at least mod A goals  2.  Antithrombotics: -DVT/anticoagulation:  Pharmaceutical: Lovenox             -antiplatelet therapy: DAPT x3 weeks followed by ASA alone. 3. Pain Management: Continue Tylenol as needed  6/11- pt denies pain- con't tylenol prn 4. Depression: LCSW to follow for evaluation and support. Increase Lexapro to 20mg , continue             -antipsychotic agents: N/AA 5. Neuropsych: This patient is not fully capable of making decisions on his own behalf. Neurostim with amantadine check with team in am , may increase dose 6. Skin/Wound Care: Routine pressure-relief measures. 7. Fluids/Electrolytes/Nutrition: Monitor intake/output.  nl BUN/Cr 8.  History of seizure disorder: Continue Lamictal twice daily 9.  Left spastic hemiparesis: increase baclofen 15 mg 4 times daily-  Had incobotulinum toxin A injection 300U on 04/22/21 to elbow flexors, wrist and finger flexors 6/11- s/p botoxulinum toxin 6/8- will monitor for improvement  10.  History of anxiety disorder: Continue Lexapro.  Hydroxyzine 3 times daily. 11.  Insomnia: Continue melatonin at bedtime.Change trazodone to Seroquel as per neuro.  12.  Hypothyroid: Stable on supplement.  13. Leukocytosis without fever-resolved  14. Elevated LFTs: d/c Lipitor, f/u CMET on Monday  15. AKI: resolved , drinking a  little more  16.  Dysphagia- poor posture in bed may need to get in [facility] to eat LOS: 11 days A FACE TO FACE EVALUATION WAS PERFORMED  Meer Reindl 04/25/2021, 12:16 PM

## 2021-04-27 LAB — CBC
HCT: 45.7 % (ref 39.0–52.0)
Hemoglobin: 15.5 g/dL (ref 13.0–17.0)
MCH: 31.6 pg (ref 26.0–34.0)
MCHC: 33.9 g/dL (ref 30.0–36.0)
MCV: 93.3 fL (ref 80.0–100.0)
Platelets: 308 10*3/uL (ref 150–400)
RBC: 4.9 MIL/uL (ref 4.22–5.81)
RDW: 12.2 % (ref 11.5–15.5)
WBC: 7.8 10*3/uL (ref 4.0–10.5)
nRBC: 0 % (ref 0.0–0.2)

## 2021-04-27 LAB — COMPREHENSIVE METABOLIC PANEL
ALT: 51 U/L — ABNORMAL HIGH (ref 0–44)
AST: 26 U/L (ref 15–41)
Albumin: 3.6 g/dL (ref 3.5–5.0)
Alkaline Phosphatase: 63 U/L (ref 38–126)
Anion gap: 7 (ref 5–15)
BUN: 19 mg/dL (ref 6–20)
CO2: 28 mmol/L (ref 22–32)
Calcium: 9.4 mg/dL (ref 8.9–10.3)
Chloride: 104 mmol/L (ref 98–111)
Creatinine, Ser: 1.07 mg/dL (ref 0.61–1.24)
GFR, Estimated: >60 mL/min (ref >60)
Glucose, Bld: 97 mg/dL (ref 70–99)
Potassium: 4.2 mmol/L (ref 3.5–5.1)
Sodium: 139 mmol/L (ref 135–145)
Total Bilirubin: 0.8 mg/dL (ref 0.3–1.2)
Total Protein: 5.9 g/dL — ABNORMAL LOW (ref 6.5–8.1)

## 2021-04-27 NOTE — Progress Notes (Signed)
Occupational Therapy Session Note  Patient Details  Name: Howard White MRN: [id number] Date of Birth: 1961-08-05  Today's Date: 04/27/2021 OT Individual Time: 3846-6599 OT Individual Time Calculation (min): 55 min    Short Term Goals: Week 2:  OT Short Term Goal 1 (Week 2): Pt will complete rolling in the bed with max assist to the right and mod assist to the left to assist with toileting tasks and LB clothing management. OT Short Term Goal 2 (Week 2): Pt will tolerate hand splint for positioning of the LUE into slight extension. OT Short Term Goal 3 (Week 2): Pt will complete LB dressing sit to stand with max assist. OT Short Term Goal 4 (Week 2): Pt will complete UB dressing with mod assist.  Skilled Therapeutic Interventions/Progress Updates:    Pt in bed to start session agreeable to complete supine to sit EOB with max assist, with max assist for flexing up the left knee to decrease extensor posturing for rolling.  Max assist for rolling to the right and for transition to sitting.  Noted pt not wearing his elbow extension splint or resting hand splint with increased flexor tone noted at both joints.  He completed squat pivot transfer to the wheelchair using Bobath method incorporating increased weightshift to the right to decrease pushing.  Mod assist needed with transition via wheelchair down to the ortho gym.  Utilized standing frame for intervals of 7-8 mins each with emphasis on weightshifting to the right as well as maintaining upright head and trunk posturing.  Max assist for achieving and maintaining cervical and trunk extension more than a few seconds.  While standing, therapist also worked on stretching out the digit and wrist flexors in the LUE as well as elbow flexors.  Pt still with increased pushing to the left in the standing frame as well.  Finished session with return to the room and resting hand splint and elbow extension splint donned with pt in the wheelchair.  Finished  session with the safety belt in place and with the call button and phone in reach.    Therapy Documentation Precautions:  Precautions Precautions: Fall Precaution Comments: severe L hemi tone, R hemi dyskinesia (?), Pushing L, aphasia Restrictions Weight Bearing Restrictions: No  Pain: Pain Assessment Pain Scale: Faces Pain Score: 0-No pain ADL: See Care Tool Section for some details of mobility and selfcare  Therapy/Group: Individual Therapy  Deijah Spikes OTR/L 04/27/2021, 12:58 PM

## 2021-04-27 NOTE — Progress Notes (Signed)
PROGRESS NOTE   Subjective/Complaints: .  No issues overnite , just finished working with OT on ADL , still has elevated tone LUE   ROS- limited by cognition  Objective:   No results found.  Recent Labs    04/27/21 0608  WBC 7.8  HGB 15.5  HCT 45.7  PLT 308    Recent Labs    04/27/21 0044  NA 139  K 4.2  CL 104  CO2 28  GLUCOSE 97  BUN 19  CREATININE 1.07  CALCIUM 9.4     Intake/Output Summary (Last 24 hours) at 04/27/2021 1028 Last data filed at 04/27/2021 0811 Gross per 24 hour  Intake 500 ml  Output --  Net 500 ml         Physical Exam: Vital Signs Blood pressure 101/69, pulse (!) 55, temperature 98.1 F (36.7 C), temperature source Oral, resp. rate 16, height 5\' 10"  (1.778 m), weight 79.6 kg, SpO2 96 %.  General: No acute distress Mood and affect are appropriate Heart: Regular rate and rhythm no rubs murmurs or extra sounds Lungs: Clear to auscultation, breathing unlabored, no rales or wheezes Abdomen: Positive bowel sounds, soft nontender to palpation, nondistended Extremities: No clubbing, cyanosis, or edema Skin: No evidence of breakdown, no evidence of rash   Neurologic:Motor 0/5 LUE, trace hip/knee ext synergy LLE Tone  MAS 4 in Left biceps, wrist and finger flexors , MAS 3 Left knee ext tone , MAS 3 at Left ankle inverters Musculoskeletal: reduced Left elbow ext due to tone, wrist and fingers in flexed position on L side unchanged    Assessment/Plan: 1. Functional deficits which require 3+ hours per day of interdisciplinary therapy in a comprehensive inpatient rehab setting. Physiatrist is providing close team supervision and 24 hour management of active medical problems listed below. Physiatrist and rehab team continue to assess barriers to discharge/monitor patient progress toward functional and medical goals  Care Tool:  Bathing    Body parts bathed by patient: Chest,  Abdomen, Left arm, Front perineal area, Right upper leg, Left upper leg   Body parts bathed by helper: Buttocks, Left lower leg, Right lower leg, Right arm Body parts n/a: Left lower leg, Right lower leg, Right arm, Left arm, Front perineal area, Buttocks   Bathing assist Assist Level: Moderate Assistance - Patient 50 - 74% (sitting only in the shower, no standing)     Upper Body Dressing/Undressing Upper body dressing   What is the patient wearing?: Pull over shirt    Upper body assist Assist Level: Maximal Assistance - Patient 25 - 49%    Lower Body Dressing/Undressing Lower body dressing      What is the patient wearing?: Pants     Lower body assist Assist for lower body dressing: Total Assistance - Patient < 25% (sit to stand)     [place] for toileting: 2 Helpers     Transfers Chair/bed transfer  Transfers assist  Chair/bed transfer activity did not occur: Safety/medical concerns  Chair/bed transfer assist level: Moderate Assistance - Patient 50 - 74% (squat pivot)     Locomotion Ambulation   Ambulation assist   Ambulation  activity did not occur: Safety/medical concerns  Assist level: 2 helpers (+2 mod/max A) Assistive device: Other (comment) (3 Musketeer) Max distance: 55ft   Walk 10 feet activity   Assist  Walk 10 feet activity did not occur: Safety/medical concerns        Walk 50 feet activity   Assist Walk 50 feet with 2 turns activity did not occur: Safety/medical concerns         Walk 150 feet activity   Assist Walk 150 feet activity did not occur: Safety/medical concerns         Walk 10 feet on uneven surface  activity   Assist Walk 10 feet on uneven surfaces activity did not occur: Safety/medical concerns         Wheelchair     Assist Will patient use wheelchair at discharge?: Yes Type of Wheelchair: Manual Wheelchair activity did not occur: Safety/medical concerns          Wheelchair 50 feet with 2 turns activity    Assist    Wheelchair 50 feet with 2 turns activity did not occur: Safety/medical concerns       Wheelchair 150 feet activity     Assist  Wheelchair 150 feet activity did not occur: Safety/medical concerns       Blood pressure 101/69, pulse (!) 55, temperature 98.1 F (36.7 C), temperature source Oral, resp. rate 16, height 5\' 10"  (1.778 m), weight 79.6 kg, SpO2 96 %.    Medical Problem List and Plan: 1.  L hemiplegia worsening due to recurrent R CR/BG infarct, has associated spasticity and contracture formation - spasticity has worsened significantly with new infarct , cognitive status declined          Continue CIR PT, OT, SLP, speech dysarthric hypophonic and delayed Appreciate neuro input  Con't PT, OT and SLP          ELOS 6/25- goals at least mod A goals  2.  Antithrombotics: -DVT/anticoagulation:  Pharmaceutical: Lovenox             -antiplatelet therapy: DAPT x3 weeks followed by ASA alone. 3. Pain Management: Continue Tylenol as needed  6/11- pt denies pain- con't tylenol prn 4. Depression: LCSW to follow for evaluation and support. Increase Lexapro to 20mg , continue             -antipsychotic agents: N/AA 5. Neuropsych: This patient is not fully capable of making decisions on his own behalf. Neurostim with amantadine check with team in am , may increase dose 6. Skin/Wound Care: Routine pressure-relief measures. 7. Fluids/Electrolytes/Nutrition: Monitor intake/output.  nl BUN/Cr 8.  History of seizure disorder: Continue Lamictal twice daily 9.  Left spastic hemiparesis: increase baclofen 15 mg 4 times daily-  Had incobotulinum toxin A injection 300U on 04/22/21 to elbow flexors, wrist and finger flexors 6/11- s/p botoxulinum toxin 6/8- will monitor for improvement - should see some effect in 2-3 d 10.  History of anxiety disorder: Continue Lexapro.  Hydroxyzine 3 times daily. 11.  Insomnia: Continue melatonin at  bedtime.Change trazodone to Seroquel as per neuro.  12.  Hypothyroid: Stable on supplement.  13. Leukocytosis without fever-resolved  14. Elevated LFTs: d/c Lipitor, f/u CMET essentially resolved  15. AKI: resolved , drinking a little more  16.  Dysphagia- poor posture in bed may need to get in [facility] to eat- no signs of aspiration  LOS: 13 days A FACE TO FACE EVALUATION WAS PERFORMED  Charlett Blake 04/27/2021, 10:28 AM

## 2021-04-27 NOTE — Progress Notes (Signed)
Speech Language Pathology Daily Session Note  Patient Details  Name: Howard White MRN: [id number] Date of Birth: 03/20/1961  Today's Date: 04/27/2021 SLP Individual Time: 1856-3149 SLP Individual Time Calculation (min): 56 min  Short Term Goals: Week 2: SLP Short Term Goal 1 (Week 2): Patient will tolerate trials of upgraded solids textures (Dys 2 and Dys 3) with modA cues for effective mastication and oral clearance. SLP Short Term Goal 2 (Week 2): Patient will achieve adequate voicing at 1-2 word level with maxA cues. SLP Short Term Goal 3 (Week 2): Patient will demonstrate sustained attention to tasks for increments of 2-3 minutes with modA cues to redirect. SLP Short Term Goal 4 (Week 2): Patient will follow 1-step functional instructions with modA cues. SLP Short Term Goal 5 (Week 2): Patient will respond to open-ended questions and/or describe at 2-3 word phrase level with maxA for intelligibility.  Skilled Therapeutic Interventions:Skilled ST services focused on education, swallow and speech skills. SLP facilitated education with pt's mother pertaining to how to use and clean provale cup to control quantity of thin liquids, she returned demonstration how to remove/put together the parts of the provale cup. Pt consumed dys 2 texture and dys 3 texture snack trials, pt demonstrated mod I use of liquid wash and required verbal cues every 3 bites to use lingual sweeps to remove mild left buccal pocketing. SLP recommends dys 2 texture trial tray in upcoming session. SLP also facilitated use of speech intelligibility strategies in structured and unstructured tasks. Pt was able to carryover strategies (increase vocal intensity, shortening phrase and use of 2-4 words per breath) on 19 out 29 opportunities in picture description tasks with mod A fading to min A verbal cues, however in unstructured conversation pt requires max A verbal cues and in simple open end question task pt required mod A verbal  cues. Pt was left in room with call bell within reach and chair alarm set. SLP recommends to continue skilled services.     Pain Pain Assessment Pain Score: 0-No pain  Therapy/Group: Individual Therapy  Howard White  [facility] 04/27/2021, 3:47 PM

## 2021-04-27 NOTE — Progress Notes (Signed)
Occupational Therapy Session Note  Patient Details  Name: Howard White MRN: [id number] Date of Birth: 10-Nov-1961  Today's Date: 04/27/2021 OT Individual Time: 1000-1030 OT Individual Time Calculation (min): 30 min    Short Term Goals: Week 2:  OT Short Term Goal 1 (Week 2): Pt will complete rolling in the bed with max assist to the right and mod assist to the left to assist with toileting tasks and LB clothing management. OT Short Term Goal 2 (Week 2): Pt will tolerate hand splint for positioning of the LUE into slight extension. OT Short Term Goal 3 (Week 2): Pt will complete LB dressing sit to stand with max assist. OT Short Term Goal 4 (Week 2): Pt will complete UB dressing with mod assist.  Skilled Therapeutic Interventions/Progress Updates:    Patient in bed alert and restless, he follows basic directions for adl tasks.  Completed LB bathing and dressing at bed level.  Max A overall for bathing, change of wet brief and lower body dressing.  He was able to roll left with min A and right with max A.  Bathing upper body bed level mod A- left side flexion positioning limiting ability to complete.  Supine to sitting edge of bed with max A.  Unsupported sitting with mod/max A - able to donn [place] shirt with mod/max A.  Returned to supine position mod A.  Bed alarm, bed belt alarm and side rails intact, telesitter intact.    Therapy Documentation Precautions:  Precautions Precautions: Fall Precaution Comments: severe L hemi tone, R hemi dyskinesia (?), Pushing L, aphasia Restrictions Weight Bearing Restrictions: No  Therapy/Group: Individual Therapy  Carlos Levering 04/27/2021, 7:40 AM

## 2021-04-27 NOTE — Progress Notes (Signed)
Physical Therapy Session Note  Patient Details  Name: Howard White MRN: [id number] Date of Birth: 26-Sep-1961  Today's Date: 04/27/2021 PT Individual Time: 1440-1530 PT Individual Time Calculation (min): 50 min   Short Term Goals: Week 2:  PT Short Term Goal 1 (Week 2): Pt will perform supine<>sit with mod assist of 1 PT Short Term Goal 2 (Week 2): Pt will perform sit<>stand with +2 mod assist PT Short Term Goal 3 (Week 2): Pt will perform bed<>chair transfers with mod assist of 1 PT Short Term Goal 4 (Week 2): Pt will initiate gait training  Skilled Therapeutic Interventions/Progress Updates:    Patient received sitting up in TIS wc, agreeable to PT. He reports "body aches," but did not rate. RN present to provide pain rx. PT transporting patient in wc to therapy gym for time management and energy conservation. Deep pressure/vibration with facilitation of AROM for L LE knee flex/extend to attempt to break up tone. Patient somewhat responsive to these intervention, though it fluctuated with distractions. Patient completing seated card matching task with board biased superiorly to encourage appropriate cervical extension and neutral thoracic posture. Patient ~50% accurate with matching cards and required ~ Mod cues to maintain appropriate posture. Progressed to standing card matching with MaxA + MinA to come to stand and up to MaxA x2 to maintain posture/balance. Patient more accurate with card matching task, but developed strong pushing to the L, mainly with R LE. PT blocking L LE flexion + adduction and attempted to facilitate appropriate weight bearing to attempt to break up any tone pattern that may be developing. However, when PT did this, patient developed increased thoracic/cervical flexion rendering it very difficult to complete card matching task. PT attempting to have patient use mirror to find midline/self-assess posture, but patient was not responsive to these multimodal cues. Patient  returning to room in wc, transferring to bed via squat pivot with MaxA x2 to the L. MaxA x2 to return supine with patient initially blocking return to supine with pushing in R UE. PT doffing L UE splints per OT request. Mild bruising found mid L bicep, but unsure if due to brace or injections. RN and OT aware. Bed alarm on, 4 rails up, call light within reach.   Therapy Documentation Precautions:  Precautions Precautions: Fall Precaution Comments: severe L hemi tone, R hemi dyskinesia (?), Pushing L, aphasia Restrictions Weight Bearing Restrictions: No    Therapy/Group: Individual Therapy  Karoline Caldwell, PT, DPT, CBIS  04/27/2021, 7:48 AM

## 2021-04-28 MED ORDER — DOCUSATE SODIUM 50 MG/5ML PO LIQD
200.0000 mg | Freq: Two times a day (BID) | ORAL | Status: DC
  Administered 2021-04-28 – 2021-05-01 (×7): 200 mg via ORAL
  Filled 2021-04-28 (×7): qty 20

## 2021-04-28 NOTE — Progress Notes (Signed)
Occupational Therapy Session Note  Patient Details  Name: Howard White MRN: [id number] Date of Birth: [birth year]  Today's Date: 04/28/2021 OT Individual Time: 0160-1093 OT Individual Time Calculation (min): 72 min    Short Term Goals: Week 2:  OT Short Term Goal 1 (Week 2): Pt will complete rolling in the bed with max assist to the right and mod assist to the left to assist with toileting tasks and LB clothing management. OT Short Term Goal 2 (Week 2): Pt will tolerate hand splint for positioning of the LUE into slight extension. OT Short Term Goal 3 (Week 2): Pt will complete LB dressing sit to stand with max assist. OT Short Term Goal 4 (Week 2): Pt will complete UB dressing with mod assist.  Skilled Therapeutic Interventions/Progress Updates:    Pt in bed to start session, agreeable to work on shower and dressing.  He was able to transfer to the EOB with max assist overall and max assist to flex the left knee to assist with rolling and decreasing extensor tone.  The [place] was utilized once sitting for efficiency with transfer into the shower.  Increased trunk and cervical flexion noted throughout session with max demonstrational cueing needed to keep his head extended to at least neutral.  Increased pushing to the left as well in sitting and with transitions to standing.  He was able to complete UB bathing with min assist in supported sitting with max assist for LB dressing.  He was able to complete sit to stand in the shower with max assist as well as stand pivot transfer out of the shower at max assist as well as stand pivot transfer to the tilt in space wheelchair.  Max assist for sequencing and donning all LB and UB clothing in sit to stand, with max assist as well.  He was transferred to the bed after completion of B/D tasks per request with max assist for stand pivot transfer.  While in bed therapist complete PROM stretching to the left elbow, wrist, and digit to help reduce tone.   Therapist also educated nurse on donning resting hand splint as well as elbow extension splint for wear at 7:00 pm tonight.  Noted decrease in LUE spasticity after stretching.  Call button and phone in reach with safety belt alarm in place.     Therapy Documentation Precautions:  Precautions Precautions: Fall Precaution Comments: severe L hemi tone, R hemi dyskinesia (?), Pushing L, aphasia Restrictions Weight Bearing Restrictions: No  Pain: Pain Assessment Pain Scale: Faces Pain Score: 0-No pain ADL: See Care Tool Section for some details of mobility and selfcare   Therapy/Group: Individual Therapy  [name] OTR/L 04/28/2021, 4:28 PM

## 2021-04-28 NOTE — Progress Notes (Signed)
Speech Language Pathology Daily Session Note  Patient Details  Name: [name] MRN: [id number] Date of Birth: 1961-04-04  Today's Date: 04/28/2021 SLP Individual Time: 1230-1300 SLP Individual Time Calculation (min): 30 min  Short Term Goals: Week 2: SLP Short Term Goal 1 (Week 2): Patient will tolerate trials of upgraded solids textures (Dys 2 and Dys 3) with modA cues for effective mastication and oral clearance. SLP Short Term Goal 2 (Week 2): Patient will achieve adequate voicing at 1-2 word level with maxA cues. SLP Short Term Goal 3 (Week 2): Patient will demonstrate sustained attention to tasks for increments of 2-3 minutes with modA cues to redirect. SLP Short Term Goal 4 (Week 2): Patient will follow 1-step functional instructions with modA cues. SLP Short Term Goal 5 (Week 2): Patient will respond to open-ended questions and/or describe at 2-3 word phrase level with maxA for intelligibility.  Skilled Therapeutic Interventions:Skilled ST services focused on swallow skills. SLP facilitated PO consumption of dys 3 texture and thin liquid via provale cup trial lunch tray. Pt required assistance getting food on spoon, requiring mod A fade to min A verbal cues for use of lingual sweeps to clear moderate-mild left buccal pocketing. Pt required only supervision A verbal cues fade to mod I to alternate solids/liquids. Pt demonstrated x1 coughing during PO intake when attempting to communicate, SLP provided education. SLP communicated with NT about texture upgrade and strategies to aid in oral clearance. Pt was left in room with call bell within reach and bed alarm set. SLP recommends to continue skilled services.     Pain Pain Assessment Pain Score: 0-No pain  Therapy/Group: Individual Therapy  Welles [place]all  [facility] 04/28/2021, 2:12 PM

## 2021-04-28 NOTE — Progress Notes (Signed)
Physical Therapy Session Note  Patient Details  Name: Howard White MRN: [id number] Date of Birth: 03-Aug-1961  Today's Date: 04/28/2021 PT Individual Time: 0810-0910 and [id number] PT Individual Time Calculation (min): 60 min and 55 min   Short Term Goals: Week 2:  PT Short Term Goal 1 (Week 2): Pt will perform supine<>sit with mod assist of 1 PT Short Term Goal 2 (Week 2): Pt will perform sit<>stand with +2 mod assist PT Short Term Goal 3 (Week 2): Pt will perform bed<>chair transfers with mod assist of 1 PT Short Term Goal 4 (Week 2): Pt will initiate gait training  Skilled Therapeutic Interventions/Progress Updates:    Session 1: Pt received supine in bed and agreeable to therapy session. Pt noted to be incontinent of bladder. Rolling L min assist, rolling R max assist during max assist LB clothing management, set-up assist anterior peri-care, and total assist posterior peri-care. Of note, pt's L LE tone is decreased compared to previously seen by this therapist. In supine, donned pants with max assist for threading and mod/max assist to pull over hips in hooklying position with therapist facilitating L LE into hip/knee flexion to allow bridging. Supine>sitting R EOB max assist for trunk upright due to posterior lean - max cuing for performing via logroll technique to increase anterior trunk lean/weight shift while coming upright.  Sitting EOB donned shirt total assist (pt unable to problem solve and motor plan donning shirt despite increased time and cuing) and socks/shoes max assist with pt having repeated posterior lean/LOB requiring max/total assist to maintain upright. L squat pivot EOB>w/c with mod assist to facilitate R trunk lean and pivot hips. MD in/out for morning assessment. RN in/out for medication administration. Therapist performing gentle and progressive L UE stretching into elbow extension, wrist extension, finger extension, forearm pronation, and shoulder external rotation then  donned elbow splint and wrist/hand splint to be worn until next session. Pt left sitting tilted back in TIS w/c at nurses station with seat belt alarm on.   Session 2: Pt received in TIS w/c at nurses station with L UE elbow and hand/wrist splints still donned. Pt agreeable to therapy session. Transported back to room in w/c and removed L UE braces - assessed skin with only minimal, blanchable redness noted on forearm and pt reporting no pain/discomfort stating he tolerated the splints well for that length of time. Pt reports need to urinate - total assist clothing and urinal management with pt unable to void in sitting therefore transitioned into standing with +2 max assist via 3 Musketeer support to come to stand then +2 mod/max assist for balance with R UE support on sink during total assist LB clothing and urinal management - continent of bladder.  Transported to/from gym in w/c for time management and energy conservation.  R squat pivot TIS w/c>EOM with light mod assist of 1 for pivoting hips - pt demos improved anterior trunk lean and improved motor planning/sequencing of this task. Sitting EOM, performed trunk control/sitting balance task via reaching with R UE to grasp and place clothespins on mirror focusing on increased trunk/cervical extension and R lateral pelvic weight shift to decrease pushing- pt demos improved ability to maintain sitting balance with only close supervision/CGA for safety during this task.   Progressed to standing with modified 3 Musketeer support via +2 max assist for lifting to stand and gain balance due to strong pushing with R LE causing L lateral lean, therapist blocking L knee. Transitioned to max assist of 1 during  dynamic reaching task to promote increased hip/trunk/cervical extension and R weight shift to decrease pushing.  Gait training ~76ft with 3 Musketeer support and heavy +2 max assist for balance, upright trunk control, facilitating R/L weight shift onto stance  limb, and max assist for L LE management to block knee during stance and facilitate swing phase (pt able to initiate swing) - pt demos worsening trunk/hip flexion today compared to prior gait training with this therapist resulting in need for increased assistance to maintain upright and poor gait mechanics.  Pt continues to look lethargic during session stating he didn't sleep well last night - assessed vitals: BP 121/85 (MAP 97), HR 78bpm, SpO2 98%  Therapist adjusted TIS w/c headrest to provide increased head support to improve cervical alignment (avoid the excessive protracted and flexed posturing). Transported back to room and pt requesting to return to bed. R squat pivot w/c>EOB light mod assist as described above. Sit>supine mod assist with pt demoing recall/carryover to transition into R sidelying first to allow therapist ability to assist with LEs without causing posterior trunk LOB. Pt left R sidelying in bed with needs in reach, bed alarm on, and telesitter in place.   Therapy Documentation Precautions:  Precautions Precautions: Fall Precaution Comments: severe L hemi tone, R hemi dyskinesia (?), Pushing L, aphasia Restrictions Weight Bearing Restrictions: No   Pain:  Session 1: Denies pain during session. States stretch and brace feels good.   Session 2: Denies pain during session and reports tolerating L UE bracing well between sessions.    Therapy/Group: Individual Therapy  Tawana Scale , PT, DPT, CSRS 04/28/2021, 7:51 AM

## 2021-04-28 NOTE — Progress Notes (Signed)
PROGRESS NOTE   Subjective/Complaints: . Labs reviewed  LLE tone improved today per PT PT notes an episode last week where agitation improved after catheterization of 650ml   ROS- limited by cognition  Objective:   No results found.  Recent Labs    04/27/21 0608  WBC 7.8  HGB 15.5  HCT 45.7  PLT 308     Recent Labs    04/27/21 0044  NA 139  K 4.2  CL 104  CO2 28  GLUCOSE 97  BUN 19  CREATININE 1.07  CALCIUM 9.4      Intake/Output Summary (Last 24 hours) at 04/28/2021 0855 Last data filed at 04/27/2021 1814 Gross per 24 hour  Intake 400 ml  Output --  Net 400 ml         Physical Exam: Vital Signs Blood pressure 112/63, pulse (!) 53, temperature 98.6 F (37 C), resp. rate 17, height 5\' 10"  (1.778 m), weight 79.6 kg, SpO2 95 %.  General: No acute distress Mood and affect are appropriate Heart: Regular rate and rhythm no rubs murmurs or extra sounds Lungs: Clear to auscultation, breathing unlabored, no rales or wheezes Abdomen: Positive bowel sounds, soft nontender to palpation, nondistended Extremities: No clubbing, cyanosis, or edema Skin: No evidence of breakdown, no evidence of rash     Neurologic:Motor 0/5 LUE, trace hip/knee ext synergy LLE Tone  MAS 4 in Left biceps, wrist and finger flexors , MAS 3 Left knee ext tone , MAS 3 at Left ankle inverters Musculoskeletal: reduced Left elbow ext due to tone, wrist and fingers in flexed position on L side unchanged    Assessment/Plan: 1. Functional deficits which require 3+ hours per day of interdisciplinary therapy in a comprehensive inpatient rehab setting. Physiatrist is providing close team supervision and 24 hour management of active medical problems listed below. Physiatrist and rehab team continue to assess barriers to discharge/monitor patient progress toward functional and medical goals  Care Tool:  Bathing    Body parts bathed  by patient: Chest, Abdomen, Left arm, Front perineal area, Right upper leg, Left upper leg   Body parts bathed by helper: Buttocks, Left lower leg, Right lower leg, Right arm Body parts n/a: Left lower leg, Right lower leg, Right arm, Left arm, Front perineal area, Buttocks   Bathing assist Assist Level: Moderate Assistance - Patient 50 - 74% (sitting only in the shower, no standing)     Upper Body Dressing/Undressing Upper body dressing   What is the patient wearing?: Pull over shirt    Upper body assist Assist Level: Maximal Assistance - Patient 25 - 49%    Lower Body Dressing/Undressing Lower body dressing      What is the patient wearing?: Pants     Lower body assist Assist for lower body dressing: Total Assistance - Patient < 25% (sit to stand)     Chartered loss adjuster assist Assist for toileting: 2 Helpers     Transfers Chair/bed transfer  Transfers assist  Chair/bed transfer activity did not occur: Safety/medical concerns  Chair/bed transfer assist level: 2 Helpers     Locomotion Ambulation   Ambulation assist   Ambulation activity did  not occur: Safety/medical concerns  Assist level: 2 helpers (+2 mod/max A) Assistive device: Other (comment) (3 Musketeer) Max distance: 69ft   Walk 10 feet activity   Assist  Walk 10 feet activity did not occur: Safety/medical concerns        Walk 50 feet activity   Assist Walk 50 feet with 2 turns activity did not occur: Safety/medical concerns         Walk 150 feet activity   Assist Walk 150 feet activity did not occur: Safety/medical concerns         Walk 10 feet on uneven surface  activity   Assist Walk 10 feet on uneven surfaces activity did not occur: Safety/medical concerns         Wheelchair     Assist Will patient use wheelchair at discharge?: Yes Type of Wheelchair: Manual Wheelchair activity did not occur: Safety/medical concerns         Wheelchair 50  feet with 2 turns activity    Assist    Wheelchair 50 feet with 2 turns activity did not occur: Safety/medical concerns       Wheelchair 150 feet activity     Assist  Wheelchair 150 feet activity did not occur: Safety/medical concerns       Blood pressure 112/63, pulse (!) 53, temperature 98.6 F (37 C), resp. rate 17, height 5\' 10"  (1.778 m), weight 79.6 kg, SpO2 95 %.    Medical Problem List and Plan: 1.  L hemiplegia worsening due to recurrent R CR/BG infarct, has associated spasticity and contracture formation - spasticity has worsened significantly with new infarct , cognitive status declined          Continue CIR PT, OT, SLP, speech dysarthric hypophonic and delayed Appreciate neuro input  Con't PT, OT and SLP          ELOS 6/25- goals at least mod A goals- team conf in am   2.  Antithrombotics: -DVT/anticoagulation:  Pharmaceutical: Lovenox             -antiplatelet therapy: DAPT x3 weeks followed by ASA alone. 3. Pain Management: Continue Tylenol as needed  6/11- pt denies pain- con't tylenol prn 4. Depression: LCSW to follow for evaluation and support. Increase Lexapro to 20mg , continue             -antipsychotic agents: N/AA 5. Neuropsych: This patient is not fully capable of making decisions on his own behalf. Neurostim with amantadine check with team in am , may increase dose 6. Skin/Wound Care: Routine pressure-relief measures. 7. Fluids/Electrolytes/Nutrition: Monitor intake/output.  nl BUN/Cr 8.  History of seizure disorder: Continue Lamictal twice daily 9.  Left spastic hemiparesis: increase baclofen 15 mg 4 times daily-  Had incobotulinum toxin A injection 300U on 04/22/21 to elbow flexors, wrist and finger flexors 6/11- s/p botoxulinum toxin 6/8- will monitor for improvement - should see some effect in 2-3 d 10.  History of anxiety disorder: Continue Lexapro.  Hydroxyzine 3 times daily. 11.  Insomnia: Continue melatonin at bedtime.Change trazodone to  Seroquel as per neuro.  12.  Hypothyroid: Stable on supplement.  13. Leukocytosis without fever-resolved  14. Elevated LFTs: d/c Lipitor, f/u CMET essentially resolved  15. AKI: resolved , drinking a little more  16.  Dysphagia- poor posture in bed may need to get in [facility] to eat- no signs of aspiration  17.  Episod eo furinary retention last week requiring cath will monitor with daily PVRs LOS: 14 days A FACE TO FACE  EVALUATION WAS PERFORMED  Charlett Blake 04/28/2021, 8:55 AM

## 2021-04-29 NOTE — Progress Notes (Signed)
Physical Therapy Session Note  Patient Details  Name: Howard White MRN: [id number] Date of Birth: December 27, 1960  Today's Date: 04/29/2021 PT Individual Time: 1112-1206 and 1535-1606 PT Individual Time Calculation (min): 54 min  and 31 min    Short Term Goals: Week 2:  PT Short Term Goal 1 (Week 2): Pt will perform supine<>sit with mod assist of 1 PT Short Term Goal 2 (Week 2): Pt will perform sit<>stand with +2 mod assist PT Short Term Goal 3 (Week 2): Pt will perform bed<>chair transfers with mod assist of 1 PT Short Term Goal 4 (Week 2): Pt will initiate gait training  Skilled Therapeutic Interventions/Progress Updates:    Session 1: Pt received supine in bed with nursing staff exiting upon therapist arrival. Pt agreeable to therapy session. Pt already wearing L UE elbow brace and wrist/hand splint and per schedule these should remained donned until 3pm - pt reports he is tolerating the braces well, denying pain although does ask for assistance donning a shirt to prevent braces from rubbing his stomach/chest. Pt noted to be incontinent of bladder - rolling L with min assist and rolling R with mod/max assist during total assist peri-care and brief management, requires mod manual facilitation to place L LE in flexed position to facilitate rolling R. Supine in bed donned shorts with max assist for threading, pt able to lift L LE in extended position due to extensor tone to assist with this, with mod manual facilitation for L LE hip/knee flexion to place in hooklying pt able to bridge and lift hips with sufficient clearance requiring max assist to pull shorts up over hips. Supine>sitting R EOB, with cuing for logroll technique to improve pt's independence and safety with this task by trying to decrease posterior trunk lean while coming upright, requires max assist. Pt able to advance to sitting EOB with CGA for safety until initiating a dynamic task such as donning his socks and shoes which results in  posterior trunk lean/LOB when pt lifts his LEs requiring 2nd person to provide max assist for trunk support. Sitting EOB with up to max assist for trunk control to prevent posterior LOB while donning shirt total assist. L squat pivot transfer EOB>w/c with +2 max assist for lifting/pivoting hips while thearpist providing max multimodal cuing and heavy manual facilitation to maintain R anterior trunk flexion - pt presents with increased pushing and poorer motor planning today. Transported to/from gym in w/c for time management and energy conservation. Therapist retrieved pt a smaller TIS wheelchair for improved fit with [employer] air cushion to allow increased pressure relief, elected to trial this cushion as pt reporting soreness in buttocks from prolonged sitting on foam cushion, also this cushion requires less maintenance than Roho and can be customized to allow improved pelvic alignment due to pt's strong pushing with R UE resulting in pelvic tilt. Squat pivot transfer TIS w/c>EOM>new TIS w/c with pt continuing to require +2 max assist in both directions (R and L) with pt having poor R anterior trunk lean/weight shift during transfers towards L and then having increased pushing with transfers towards the R. Sitting EOM pt demos poor trunk control requiring at least mod assist up to max/total assist to maintain upright due to strong L posterior trunk lean. Pt overall requires increased assistance to complete functional mobility tasks this session with pt presenting with increased pushing and worsening motor planning.  Upon return to pt's room his mother and father were present - therapist educated them on pt's fluctuation in  presentation requiring up to +2 max assist for bed<>chair transfers today. Pt left tilted back in TIS w/c with needs in reach, L UE splints donned with pillow for improved trunk alignment, seat belt alarm on, and pt's family present.   Session 2: Pt received sitting in TIS w/c and agreeable to  therapy session.  Transported to/from gym in w/c for time management and energy conservation. R squat pivot transfer w/c>EOM with mod assist to maintain anterior trunk lean/weight shift and to pivot hips - pt demos decreased pushing compared to AM session allowing ability to safely complete transfer with only 1 person assist. Sitting EOM with close supervision - demos improved trunk control. Sit>stands from EOM with R UE support around +2 assist's shoulders and +2 mod/max assist for lifting to stand, balance due to strong pushing with R LE causing heay L lean, facilitating L LE knee extension, and manual facilitation for increased trunk/hip extension to achieve more upright posture - during session was able to progress to only max assist of 1 person to come to standing although inconsistently. Standing with max assist of 1 for balance (due to strong L lean and guarding/facilitating L knee extension) during dynamic reaching task with R UE promoting increased trunk/hip extension and R weight shift to force decreased pushing and promote increased L LE knee extension.   Gait training ~27ft with 3 Musketeer support, requires max assist of 1 and mod assist of 2nd person for balance and L LE management - pt able to initiate advancement of L LE though has extensor tone limiting knee flexion ROM preventing foot clearance - with +2 max/total assist able to turn and sit in chair at end of gait, pt able to reach back with R UE towards armrest and control descent into chair with only max assist of 1, cuing for R posterior weight shift of hips into chair.  Gait training 19ft with 3 Musketeer support and +2 mod/max assist for balance due to L lateral trunk lean, for facilitating trunk/hip extension, and managing L LE gait mechanics (3rd person w/c follow and 4th person cuing for sustained upright trunk posturing) - pt continues to be able to initiate L LE swing phase advancement requiring assist to reposition (prevent  excessive adduction and facilitate increased step length) - pt able to perform reciprocal stepping pattern >50% of the time, and requires min/mod guarding to block L knee during stance phase.   Transported back to room and pt requesting to return to bed. R squat pivot TIS w/c>EOB with max assist to facilitate anterior trunk lean and pivot hips - thearpist did not block L LE extensor tone resulting in his foot coming out from under him while scooting resulting in poor hip positioning on the bed, required mod assist to correct. Sit>supine with mod assist for B LE management into bed via reverse logroll technique to allow pt to maintain anterior trunk lean/weight shift (prevent posterior trunk LOB during LB management). Pt left supine in bed with HOB elevated, needs in reach, and bed alarm on.  Therapy Documentation Precautions:  Precautions Precautions: Fall Precaution Comments: severe L hemi tone, R hemi dyskinesia (?), Pushing L, aphasia Restrictions Weight Bearing Restrictions: No   Pain:  Session 1: Denies pain during session.  Session 2: Denies pain during session.   Therapy/Group: Individual Therapy  Tawana Scale , PT, DPT, CSRS 04/29/2021, 8:01 AM

## 2021-04-29 NOTE — Progress Notes (Signed)
Patient ID: Howard White, male   DOB: 1961/06/12, 60 y.o.   MRN: [id number] Team Conference Report to Patient/Family  Team Conference discussion was reviewed with the patient and caregiver, including goals, any changes in plan of care and target discharge date.  Patient and caregiver express understanding and are in agreement.  The patient has a target discharge date of 05/09/21.  SW met with patient and parents in room. Provided conference updates. Mother concerned about size of hoyer and lap tray for patient. Sw will follow up  [name] 04/29/2021, 2:51 PM

## 2021-04-29 NOTE — Patient Care Conference (Signed)
Inpatient RehabilitationTeam Conference and Plan of Care Update Date: 04/29/2021   Time: 10:43 AM    Patient Name: Howard White      Medical Record Number: [id number]  Date of Birth: 1961-11-10 Sex: Male         Room/Bed: 4W17C/4W17C-01 Payor Info: Payor: [employer] / Plan: [employer] PPO / Product Type: *No Product type* /    Admit Date/Time:  04/14/2021  2:42 PM  Primary Diagnosis:  Acute stroke of basal ganglia [facility])  Hospital Problems: Principal Problem:   Acute stroke of basal ganglia [facility])    Expected Discharge Date: Expected Discharge Date: 05/09/21  Team Members Present: Physician leading conference: Dr. Alysia Penna Care Coodinator Present: Dorien Chihuahua, RN, BSN, CRRN;Christina Needmore, BSW Nurse Present: Dorien Chihuahua, RN PT Present: Estevan Ryder, PT OT Present: Clyda Greener, OT SLP Present: Weston Anna, SLP PPS Coordinator present : Ileana Ladd, PT     Current Status/Progress Goal Weekly Team Focus  Bowel/Bladder   Patient incontinent both B&B, LBM 6/12  Patient will regain B&B continence  encourage scheduled toileting/using the urinal   Swallow/Nutrition/ Hydration   Dys 2 textures and thin liquids, min-mod A  Min A  swallow strategies and dys 3 texture trials   ADL's   Min assist for UB bathing with max assist for UB dressing.  Max assist for LB bathing and dressing sit to stand.  Still with increased tone in the LUE as well as increased pusher tendencies to the left as well as motor impesistence.  Increased extensor posturing in his LEs.  Mod to max for squat pivot transfers as well.  mod to max assist  selfcare retraining, balance retraining, DME education neuromuscular re-education, balance retraining, pt/famiy education   Mobility   min assist rolling L, max assist rolling R, max assist sit<>supine, mod assist squat pivot transfers, +2 mod/max assist sit<>stands, gait up to 30ft with +2 max assist via 3 Musketeer support -  static sitting balance up to 17minute with supervision/CGA and dynamic sitting balance varying up to max/total assist - continues to have pusher syndrome and hypertonia in L UE (flexor) and L LE (extensor) with poor awareness of midline orientation  min assist bed mobility, mod assist transfers, max assist gait  activity tolerance, bed mobility training, sitting balance/trunk control, transfer training, standing balance and tolerance, L LE tone management and NMR, midline orientation awareness with decreased pushing, progression to gait training   Communication   Max A unstructured and mod A structured task speech intelligibility  Min-Mod A  speech intelligibility strategies   Safety/Cognition/ Behavioral Observations  max A  Mod A  sustained attention, basic problem solving, recall and error awareness   Pain   Patient has no pain complains  Patient will remain pain free  Regular pain assessment and treat PRN   Skin   Patient skin remains intact  Maintain patient's skin integrity  Regulary assess patient for incontinence and keep clean and dry, help reposition in bed q2 hrly     Discharge Planning:  Discharging back home with family to assist   Team Discussion: Botox injection completed approximately week ago; anticipate noticing improvements in tone. Bicep tone with brace improved, MD adjusted baclofen.  Continue to note significant tone in the elbow and wrist even with using elbow extensor splint and resting hand splint. Progress limited by tone, poor awareness of midline, severe truncal flexion and left head tilt, impulsivity and pushing with standing.  Staff do not feel extension of LOS would  affect overall functional status.   Patient on target to meet rehab goals: Currently mod - max assist for squat pivot transfers, max assist for sit- stand with 3 musketeer style support, mod - max assist for dressing with cues for Left UE placement. Requires max assist for lower body care due to tone and  left LE flexion. Requires max assist for basic awareness, speech intelligibility and on D2 diet; using a provale cup for safety with liquids. Discharge goals set for mod - max assist.  *See Care Plan and progress notes for long and short-term goals.   Revisions to Treatment Plan:    Teaching Needs: Safety, transfers, toileting, medication management, etc.   Current Barriers to Discharge: Decreased caregiver support, Home enviroment access/layout, and Incontinence  Possible Resolutions to Barriers: HH follow up services recommended PCS/custodial care services referral Extensive family education with mother Custom wheelchair consult     Medical Summary Current Status: Some improvement in biceps tone, mother concerned about discharge date and her ability to care for patient.  Barriers to Discharge: Decreased family/caregiver support  Barriers to Discharge Comments: Elderly caregivers Possible Resolutions to [employer] Focus: Needs continue therapy efforts, continue to manage elevated tone with bracing, oral and injectable medication   Continued Need for Acute Rehabilitation Level of Care: The patient requires daily medical management by a physician with specialized training in physical medicine and rehabilitation for the following reasons: Direction of a multidisciplinary physical rehabilitation program to maximize functional independence : Yes Medical management of patient stability for increased activity during participation in an intensive rehabilitation regime.: Yes Analysis of laboratory values and/or radiology reports with any subsequent need for medication adjustment and/or medical intervention. : Yes   I attest that I was present, lead the team conference, and concur with the assessment and plan of the team.   Dorien Chihuahua B 04/29/2021, 12:47 PM

## 2021-04-29 NOTE — Progress Notes (Signed)
Orthopedic Tech Progress Note Patient Details:  Howard White Jul 16, 1961 [id number] Nurse called requesting someone come take a look at a complication concerning pts bledsoe elbow brace. I informed RN that those braces come from an outside vendor and I would have to reach out to that company to have someone take a look at it. I also informed RN that the earliest possible time someone could come would be in the morning 04/29/2021 around 8 or 9am.  Patient ID: Howard White, male   DOB: 08-17-1961, 60 y.o.   MRN: [id number]  [name] 04/29/2021, 12:51 AM

## 2021-04-29 NOTE — Progress Notes (Signed)
Occupational Therapy Session Note  Patient Details  Name: Kendale Rembold MRN: [id number] Date of Birth: [birth year]  Today's Date: 04/29/2021 OT Individual Time: 1300-1402 OT Individual Time Calculation (min): 62 min    Short Term Goals: Week 2:  OT Short Term Goal 1 (Week 2): Pt will complete rolling in the bed with max assist to the right and mod assist to the left to assist with toileting tasks and LB clothing management. OT Short Term Goal 2 (Week 2): Pt will tolerate hand splint for positioning of the LUE into slight extension. OT Short Term Goal 3 (Week 2): Pt will complete LB dressing sit to stand with max assist. OT Short Term Goal 4 (Week 2): Pt will complete UB dressing with mod assist.  Skilled Therapeutic Interventions/Progress Updates:    Pt in tilt in space wheelchair to start session.  Therapist removed left elbow extension splint as well as left resting hand splint prior to session.  No noted redness with any areas.  He was taken down to the therapy gym where he completed squat pivot transfer to the therapy mat with total assist to the right.  Worked on sitting balance EOB with functional reaching incorporated with the RUE to encourage weightshfting toward the pushing side.  He continues to try and impulsively move at command, without completing safe sequencing steps for task.  The RLE continues to push in sitting when on the EOM or when positioned on a foam wedge.  Elevated mat up off the ground to prevent him from stabilizing and pushing with the RLE while reaching for specific colored pegs with the RUE, toward the right side.  Had him also reach up to place peg in therapist's hand as well as taking the peg from the therapist in order to increased trunk and cervical extension.  LUE was placed in slight weightbearing on therapist's leg with elbow extension at approximately 90 degrees with digit extension achieved passively.  Transitioned to standing as well for peg placement in the  grid with moderate pushing noted to the RLE in standing as well as increased trunk flexion.  Total +2 (pt 25%) for standing for approximately 3 mins.  Finished session with transfer back to the wheelchair squat pivot with max assist to the left.  He was taken back to the room and left with the call button and phone in reach and safety belt in place.  Charge nurse made aware for need to re-apply splints on the left arm tonight at 7:00.    Therapy Documentation Precautions:  Precautions Precautions: Fall Precaution Comments: severe L hemi tone, R hemi dyskinesia (?), Pushing L, aphasia Restrictions Weight Bearing Restrictions: No  Pain: Pain Assessment Pain Scale: Faces Pain Score: 0-No pain ADL: See Care Tool Section for some details of mobility and selfcare   Therapy/Group: Individual Therapy  Corrinne Benegas OTR/L 04/29/2021, 3:29 PM

## 2021-04-29 NOTE — Progress Notes (Signed)
Speech Language Pathology Daily Session Note  Patient Details  Name: Howard White MRN: [id number] Date of Birth: 10-May-1961  Today's Date: 04/29/2021 SLP Individual Time: 0804-0900 SLP Individual Time Calculation (min): 56 min  Short Term Goals: Week 2: SLP Short Term Goal 1 (Week 2): Patient will tolerate trials of upgraded solids textures (Dys 2 and Dys 3) with modA cues for effective mastication and oral clearance. SLP Short Term Goal 2 (Week 2): Patient will achieve adequate voicing at 1-2 word level with maxA cues. SLP Short Term Goal 3 (Week 2): Patient will demonstrate sustained attention to tasks for increments of 2-3 minutes with modA cues to redirect. SLP Short Term Goal 4 (Week 2): Patient will follow 1-step functional instructions with modA cues. SLP Short Term Goal 5 (Week 2): Patient will respond to open-ended questions and/or describe at 2-3 word phrase level with maxA for intelligibility.  Skilled Therapeutic Interventions:Skilled ST services focused on swallow and cognitive skills. SLP facilitated PO intake od recently upgraded dys 2 textures and thin liquids via provale cup breakfast tray. Pt demonstrated mod I use of lingual wash, required mod A fade to min A verbal cues for use of lingual sweeps and eventually a finger sweep to remove left buccal pocketing. SLP completed oral care removing mild left buccal residue. Pt demonstrate basic problem solving skills and following 1 step directions in sequencing 3 step pictures, pt required max A fade to mod A verbal cues. Pt continues to require max A verbal cues to carryover speech strategies but recalls strategies with supervision A verbal cues. Pt was left in room with call bell within reach and bed alarm set. SLP recommends to continue skilled services.     Pain Pain Assessment Pain Scale: 0-10 Pain Score: 0-No pain  Therapy/Group: Individual Therapy  [facility] [place]  [facility] 04/29/2021, 9:57 AM

## 2021-04-29 NOTE — Progress Notes (Signed)
PROGRESS NOTE   Subjective/Complaints: Some brace fit issues, using this for tone , no hx of fx or dislocation , may be removed at anytime if not fitting properly or uncomfortable, OT /PT may adjust   ROS- limited by cognition  Objective:   No results found.  Recent Labs    04/27/21 0608  WBC 7.8  HGB 15.5  HCT 45.7  PLT 308     Recent Labs    04/27/21 0044  NA 139  K 4.2  CL 104  CO2 28  GLUCOSE 97  BUN 19  CREATININE 1.07  CALCIUM 9.4      Intake/Output Summary (Last 24 hours) at 04/29/2021 0850 Last data filed at 04/29/2021 0839 Gross per 24 hour  Intake 840 ml  Output 50 ml  Net 790 ml         Physical Exam: Vital Signs Blood pressure 104/67, pulse (!) 52, temperature 98.2 F (36.8 C), resp. rate 18, height _0  (1.778 m), weight 79.6 kg, SpO2 100 %.   General: No acute distress Mood and affect are appropriate Heart: Regular rate and rhythm no rubs murmurs or extra sounds Lungs: Clear to auscultation, breathing unlabored, no rales or wheezes Abdomen: Positive bowel sounds, soft nontender to palpation, nondistended Extremities: No clubbing, cyanosis, or edema Skin: No evidence of breakdown, no evidence of rash   Neurologic:Motor 0/5 LUE, trace hip/knee ext synergy LLE Tone  MAS 4 in Left biceps, wrist and finger flexors , MAS 3 Left knee ext tone , MAS 3 at Left ankle inverters Musculoskeletal: reduced Left elbow ext due to tone, wrist and fingers in flexed position on L side unchanged    Assessment/Plan: 1. Functional deficits which require 3+ hours per day of interdisciplinary therapy in a comprehensive inpatient rehab setting. Physiatrist is providing close team supervision and 24 hour management of active medical problems listed below. Physiatrist and rehab team continue to assess barriers to discharge/monitor patient progress toward functional and medical goals  Care  Tool:  Bathing    Body parts bathed by patient: Abdomen, Chest, Left arm, Front perineal area, Right upper leg, Left upper leg, Right lower leg, Left lower leg, Face   Body parts bathed by helper: Buttocks, Right arm Body parts n/a: Left lower leg, Right lower leg, Right arm, Left arm, Front perineal area, Buttocks   Bathing assist Assist Level: Maximal Assistance - Patient 24 - 49% (sit to stand)     Upper Body Dressing/Undressing Upper body dressing   What is the patient wearing?: Pull over shirt    Upper body assist Assist Level: Maximal Assistance - Patient 25 - 49%    Lower Body Dressing/Undressing Lower body dressing      What is the patient wearing?: Pants, Incontinence brief     Lower body assist Assist for lower body dressing: Maximal Assistance - Patient 25 - 49%     Toileting Toileting    Toileting assist Assist for toileting: 2 Helpers     Transfers Chair/bed transfer  Transfers assist  Chair/bed transfer activity did not occur: Safety/medical concerns  Chair/bed transfer assist level: Maximal Assistance - Patient 25 - 49% (stand pivot)  Locomotion Ambulation   Ambulation assist   Ambulation activity did not occur: Safety/medical concerns  Assist level: 2 helpers (+2 max A) Assistive device: Other (comment) (3 Musketeer) Max distance: 47f   Walk 10 feet activity   Assist  Walk 10 feet activity did not occur: Safety/medical concerns        Walk 50 feet activity   Assist Walk 50 feet with 2 turns activity did not occur: Safety/medical concerns         Walk 150 feet activity   Assist Walk 150 feet activity did not occur: Safety/medical concerns         Walk 10 feet on uneven surface  activity   Assist Walk 10 feet on uneven surfaces activity did not occur: Safety/medical concerns         Wheelchair     Assist Will patient use wheelchair at discharge?: Yes Type of Wheelchair: Manual Wheelchair activity did  not occur: Safety/medical concerns         Wheelchair 50 feet with 2 turns activity    Assist    Wheelchair 50 feet with 2 turns activity did not occur: Safety/medical concerns       Wheelchair 150 feet activity     Assist  Wheelchair 150 feet activity did not occur: Safety/medical concerns       Blood pressure 104/67, pulse (!) 52, temperature 98.2 F (36.8 C), resp. rate 18, height _0  (1.778 m), weight 79.6 kg, SpO2 100 %.    Medical Problem List and Plan: 1.  L hemiplegia worsening due to recurrent R CR/BG infarct, has associated spasticity and contracture formation - spasticity has worsened significantly with new infarct , cognitive status declined          Continue CIR PT, OT, SLP, speech dysarthric hypophonic and delayed Appreciate neuro input  Con't PT, OT and SLP          ELOS 6/25- goals at least mod A goals- Team conference today please see physician documentation under team conference tab, met with team  to discuss problems,progress, and goals. Formulized individual treatment plan based on medical history, underlying problem and comorbidities. Mother interested in ext , we discussed this would depend if pt can achieve additional goals with an ext   2.  Antithrombotics: -DVT/anticoagulation:  Pharmaceutical: Lovenox             -antiplatelet therapy: DAPT x3 weeks followed by ASA alone. 3. Pain Management: Continue Tylenol as needed  6/11- pt denies pain- con't tylenol prn 4. Depression: LCSW to follow for evaluation and support. Increase Lexapro to 267m continue             -antipsychotic agents: N/AA 5. Neuropsych: This patient is not fully capable of making decisions on his own behalf. Neurostim with amantadine check with team in am , may increase dose 6. Skin/Wound Care: Routine pressure-relief measures. 7. Fluids/Electrolytes/Nutrition: Monitor intake/output.  nl BUN/Cr 8.  History of seizure disorder: Continue Lamictal twice daily 9.  Left  spastic hemiparesis: increase baclofen 15 mg 4 times daily-  Had incobotulinum toxin A injection 300U on 04/22/21 to elbow flexors, wrist and finger flexors 6/11- s/p botoxulinum toxin 6/8- will monitor for improvement - should see some effect in 1-2 d 10.  History of anxiety disorder: Continue Lexapro.  Hydroxyzine 3 times daily. 11.  Insomnia: Continue melatonin at bedtime.Change trazodone to Seroquel as per neuro.  12.  Hypothyroid: Stable on supplement.  13. Leukocytosis without fever-resolved  14. Elevated LFTs:  d/c Lipitor, f/u CMET essentially resolved  15. AKI: resolved , drinking a little more  16.  Dysphagia- poor posture in bed may need to get in [facility] to eat- no signs of aspiration  17.  Episode ofurinary retention last week requiring cath will monitor with daily PVRs LOS: 15 days A FACE TO FACE EVALUATION WAS PERFORMED  Charlett Blake 04/29/2021, 8:50 AM

## 2021-04-29 NOTE — Progress Notes (Signed)
Physical Therapy Weekly Progress Note  Patient Details  Name: Howard White MRN: [id number] Date of Birth: 1961/01/30  Beginning of progress report period: April 22, 2021 End of progress report period: April 29, 2021   Patient has met 1 of 4 short term goals.  Howard White is demonstrating slow and somewhat fluctuating progress with therapy depending on L hemibody tone, attention to task/restlessness, and intensity of his pushing presentation. He is able to perform supine>sit with max assist of 1, sit>supine with mod assist of 1, bed<>chair transfers with mod assist of 1 but when pushing presentation more prevalent can require up to +2 max assist, sit<>stands using 3 Musketeer support with +2 mod/max assist, and progressed to gait training up to 87f using 3 Musketeer support and +2 max assist. Pt requires up to max/total assist for dynamic sitting balance due to strong posterior lean/LOB (fluctuates to have L posterior lean as well). He will benefit from continued CIR level therapies to further advance independence with functional mobility and decrease caregiver burden.   Patient continues to demonstrate the following deficits muscle weakness, muscle joint tightness, and muscle paralysis, decreased cardiorespiratoy endurance, impaired timing and sequencing, abnormal tone, unbalanced muscle activation, motor apraxia, decreased coordination, and decreased motor planning, decreased midline orientation and decreased attention to left, decreased initiation, decreased attention, decreased awareness, decreased problem solving, decreased safety awareness, decreased memory, and delayed processing, and decreased sitting balance, decreased standing balance, decreased postural control, hemiplegia, and decreased balance strategies and therefore will continue to benefit from skilled PT intervention to increase functional independence with mobility.  Patient progressing toward long term goals..  Continue plan of  care.  PT Short Term Goals  Week 2:  PT Short Term Goal 1 (Week 2): Pt will perform supine<>sit with mod assist of 1 PT Short Term Goal 1 - Progress (Week 2): Progressing toward goal PT Short Term Goal 2 (Week 2): Pt will perform sit<>stand with +2 mod assist PT Short Term Goal 2 - Progress (Week 2): Progressing toward goal PT Short Term Goal 3 (Week 2): Pt will perform bed<>chair transfers with mod assist of 1 PT Short Term Goal 3 - Progress (Week 2): Progressing toward goal PT Short Term Goal 4 (Week 2): Pt will initiate gait training PT Short Term Goal 4 - Progress (Week 2): Met Week 3:  PT Short Term Goal 1 (Week 3): Pt will consistently perform supine<>sit with mod assist PT Short Term Goal 2 (Week 3): Pt will consistently perform bed<>chair transfers with mod assist of 1 PT Short Term Goal 3 (Week 3): Pt will perform sit<>stands with max assist of 1 PT Short Term Goal 4 (Week 3): Pt will ambulate at least 35fusing LRAD with +2 mod assist  Skilled Therapeutic Interventions/Progress Updates:  Ambulation/gait training;Discharge planning;Functional mobility training;Psychosocial support;Therapeutic Activities;Visual/perceptual remediation/compensation;Balance/vestibular training;Disease management/prevention;Neuromuscular re-education;Skin care/wound management;Therapeutic Exercise;Wheelchair propulsion/positioning;Cognitive remediation/compensation;DME/adaptive equipment instruction;Pain management;Splinting/orthotics;UE/LE Strength taining/ROM;Community reintegration;Functional electrical stimulation;Patient/family education;Stair training;UE/LE Coordination activities   Therapy Documentation Precautions:  Precautions Precautions: Fall Precaution Comments: severe L hemi tone, R hemi dyskinesia (?), Pushing L, aphasia Restrictions Weight Bearing Restrictions: No  CaTawana Scale PT, DPT, CSRS 04/29/2021, 6:28 PM

## 2021-04-30 DIAGNOSIS — G8114 Spastic hemiplegia affecting left nondominant side: Secondary | ICD-10-CM

## 2021-04-30 DIAGNOSIS — R4701 Aphasia: Secondary | ICD-10-CM

## 2021-04-30 DIAGNOSIS — R1312 Dysphagia, oropharyngeal phase: Secondary | ICD-10-CM

## 2021-04-30 NOTE — Progress Notes (Signed)
PROGRESS NOTE   Subjective/Complaints: Pt just finished with PT. Walked 95'!  No new problems reported. Left upper extremity still quite spastic  ROS: limited due to language/communication     Objective:   No results found.  No results for input(s): WBC, HGB, HCT, PLT in the last 72 hours.  No results for input(s): NA, K, CL, CO2, GLUCOSE, BUN, CREATININE, CALCIUM in the last 72 hours.   Intake/Output Summary (Last 24 hours) at 04/30/2021 1148 Last data filed at 04/30/2021 0800 Gross per 24 hour  Intake 880 ml  Output --  Net 880 ml        Physical Exam: Vital Signs Blood pressure 101/63, pulse (!) 51, temperature 97.8 F (36.6 C), temperature source Oral, resp. rate 18, height 5\' 10"  (1.778 m), weight 79.6 kg, SpO2 98 %.   Constitutional: No distress . Vital signs reviewed. HEENT: EOMI, oral membranes moist Neck: supple Cardiovascular: RRR without murmur. No JVD    Respiratory/Chest: CTA Bilaterally without wheezes or rales. Normal effort    GI/Abdomen: BS +, non-tender, non-distended Ext: no clubbing, cyanosis, or edema Psych: pleasant and cooperative  Neurologic:Motor 0/5 LUE, LLE 2-3/5 but inconsistent effort Tone  MAS 4 in Left biceps, wrist and finger flexors , MAS 2-3 Left knee ext tone , MAS 3 at Left ankle inverters--no significant changes  Musculoskeletal: reduced Left elbow ext due to tone, wrist and fingers in flexed position on L side unchanged    Assessment/Plan: 1. Functional deficits which require 3+ hours per day of interdisciplinary therapy in a comprehensive inpatient rehab setting. Physiatrist is providing close team supervision and 24 hour management of active medical problems listed below. Physiatrist and rehab team continue to assess barriers to discharge/monitor patient progress toward functional and medical goals  Care Tool:  Bathing    Body parts bathed by patient: Abdomen, Chest,  Left arm, Front perineal area, Right upper leg, Left upper leg, Right lower leg, Left lower leg, Face   Body parts bathed by helper: Buttocks, Right arm Body parts n/a: Left lower leg, Right lower leg, Right arm, Left arm, Front perineal area, Buttocks   Bathing assist Assist Level: Maximal Assistance - Patient 24 - 49% (sit to stand)     Upper Body Dressing/Undressing Upper body dressing   What is the patient wearing?: Pull over shirt    Upper body assist Assist Level: Maximal Assistance - Patient 25 - 49%    Lower Body Dressing/Undressing Lower body dressing      What is the patient wearing?: Pants, Incontinence brief     Lower body assist Assist for lower body dressing: Maximal Assistance - Patient 25 - 49%     Toileting Toileting    Toileting assist Assist for toileting: 2 Helpers     Transfers Chair/bed transfer  Transfers assist  Chair/bed transfer activity did not occur: Safety/medical concerns  Chair/bed transfer assist level: Maximal Assistance - Patient 25 - 49% (squat pivot)     Locomotion Ambulation   Ambulation assist   Ambulation activity did not occur: Safety/medical concerns  Assist level: 2 helpers (+2 max A) Assistive device: Other (comment) (3 Musketeer) Max distance: 73ft   Walk 10  feet activity   Assist  Walk 10 feet activity did not occur: Safety/medical concerns        Walk 50 feet activity   Assist Walk 50 feet with 2 turns activity did not occur: Safety/medical concerns         Walk 150 feet activity   Assist Walk 150 feet activity did not occur: Safety/medical concerns         Walk 10 feet on uneven surface  activity   Assist Walk 10 feet on uneven surfaces activity did not occur: Safety/medical concerns         Wheelchair     Assist Will patient use wheelchair at discharge?: Yes Type of Wheelchair: Manual Wheelchair activity did not occur: Safety/medical concerns         Wheelchair 50 feet  with 2 turns activity    Assist    Wheelchair 50 feet with 2 turns activity did not occur: Safety/medical concerns       Wheelchair 150 feet activity     Assist  Wheelchair 150 feet activity did not occur: Safety/medical concerns       Blood pressure 101/63, pulse (!) 51, temperature 97.8 F (36.6 C), temperature source Oral, resp. rate 18, height 5\' 10"  (1.778 m), weight 79.6 kg, SpO2 98 %.    Medical Problem List and Plan: 1.  L hemiplegia worsening due to recurrent R CR/BG infarct, has associated spasticity and contracture formation - spasticity has worsened significantly with new infarct , cognitive status declined          Continue CIR PT, OT, SLP, speech dysarthric hypophonic and delayed Appreciate neuro input  Con't PT, OT and SLP          ELOS 6/25- goals at least mod A goals- family interested in extension--may be possible depending upon his progress  2.  Antithrombotics: -DVT/anticoagulation:  Pharmaceutical: Lovenox             -antiplatelet therapy: DAPT x3 weeks followed by ASA alone. 3. Pain Management: Continue Tylenol as needed  6/11- pt denies pain- con't tylenol prn 4. Depression: LCSW to follow for evaluation and support. Increase Lexapro to 20mg , continue             -antipsychotic agents: N/AA 5. Neuropsych: This patient is not fully capable of making decisions on his own behalf. Neurostim with amantadine check with team in am , may increase dose 6. Skin/Wound Care: Routine pressure-relief measures. 7. Fluids/Electrolytes/Nutrition: Monitor intake/output.  nl BUN/Cr 8.  History of seizure disorder: Continue Lamictal twice daily 9.  Left spastic hemiparesis: increase baclofen 15 mg 4 times daily-  Had incobotulinum toxin A injection 300U on 04/22/21 to elbow flexors, wrist and finger flexors 6/16- s/p botoxulinum toxin 6/8- continue to monitor for effect. Tone remains quite significant however. 10.  History of anxiety disorder: Continue Lexapro.   Hydroxyzine 3 times daily. 11.  Insomnia: Continue melatonin at bedtime.Change trazodone to Seroquel as per neuro.  12.  Hypothyroid: Stable on supplement.  13. Leukocytosis without fever-resolved  14. Elevated LFTs: d/c Lipitor, f/u CMET essentially resolved  15. AKI: resolved , drinking a little more  16.  Dysphagia- poor posture in bed may need to get in [employer] to eat- no signs of aspiration  17.  Episode ofurinary retention last week requiring cath will monitor with daily PVRs   LOS: 16 days A FACE TO FACE EVALUATION WAS PERFORMED  Meredith Staggers 04/30/2021, 11:48 AM

## 2021-04-30 NOTE — Progress Notes (Signed)
Speech Language Pathology Weekly Progress and Session Note  Patient Details  Name: Howard White MRN: [id number] Date of Birth: May 16, 1961  Beginning of progress report period: 04/22/2021 End of progress report period: 04/30/2021  Today's Date: 04/30/2021 SLP Individual Time: 1305-1400 SLP Individual Time Calculation (min): 55 min  Short Term Goals: Week 2: SLP Short Term Goal 1 (Week 2): Patient will tolerate trials of upgraded solids textures (Dys 2 and Dys 3) with modA cues for effective mastication and oral clearance. SLP Short Term Goal 1 - Progress (Week 2): Partly met SLP Short Term Goal 2 (Week 2): Patient will achieve adequate voicing at 1-2 word level with maxA cues. SLP Short Term Goal 2 - Progress (Week 2): Met SLP Short Term Goal 3 (Week 2): Patient will demonstrate sustained attention to tasks for increments of 2-3 minutes with modA cues to redirect. SLP Short Term Goal 3 - Progress (Week 2): Met SLP Short Term Goal 4 (Week 2): Patient will follow 1-step functional instructions with modA cues. SLP Short Term Goal 4 - Progress (Week 2): Met SLP Short Term Goal 5 (Week 2): Patient will respond to open-ended questions and/or describe at 2-3 word phrase level with maxA for intelligibility. SLP Short Term Goal 5 - Progress (Week 2): Met    New Short Term Goals: Week 3: SLP Short Term Goal 1 (Week 3): Patient will tolerate trials of Dys 3 solids textures with minA for clearing oral pocketing/residuals. SLP Short Term Goal 2 (Week 3): Patient will achieve adequate vocal intensity to produce 3-4 word phrases with modA cues. SLP Short Term Goal 3 (Week 3): Patient will demonstrate awareness to errors during functional tasks with mod-maxA cues. SLP Short Term Goal 4 (Week 3): Patient will demonstrated sustained attention to basic level task with minA to redirect.  Weekly Progress Updates:  Patient made very good progress and met all STG's. He is demonstrating significant improvement  in attention, problem solving, awareness and ability to express wants/needs/thoughts verbally. He continues to struggle with achieving adequate voicing at short phrase level.    Intensity: Minumum of 1-2 x/day, 30 to 90 minutes Frequency: 3 to 5 out of 7 days Duration/Length of Stay: 6/25 Treatment/Interventions: Cognitive remediation/compensation;Patient/family education;Speech/Language facilitation   Daily Session  Skilled Therapeutic Interventions: Patient seen for skilled ST session focusing on cognitive-linguistic and speech/voice goals. Patient sitting up in chair when SLP arrived. He was alert and attentive, sipping on water from [employer] cup without overt s/s aspiration. He required mod-maxA to achieve adequate vocal intensity at 2-3 word phrase level. He completed basic level sustained attention task improving from minA to supervision to Altha. Patient did exhibit some awareness to errors as noted by him correcting himself, however this was with peg task and only with right side of peg board. He did not exhibit awareness to errors on left side and required modA cues to attend and correct. Patient continues to benefit from skilled SLP intervention to maximize cognitive-linguistic and speech/voice function prior to discharge.     General    Pain Pain Assessment Pain Scale: Faces Faces Pain Scale: No hurt  Therapy/Group: Individual Therapy  Sonia Baller, MA, CCC-SLP Speech Therapy

## 2021-04-30 NOTE — Progress Notes (Signed)
Physical Therapy Session Note  Patient Details  Name: Howard White MRN: [id number] Date of Birth: [birth year]  Today's Date: 04/30/2021 PT Individual Time: 0805-0907 PT Individual Time Calculation (min): 62 min   Short Term Goals: Week 3:  PT Short Term Goal 1 (Week 3): Pt will consistently perform supine<>sit with mod assist PT Short Term Goal 2 (Week 3): Pt will consistently perform bed<>chair transfers with mod assist of 1 PT Short Term Goal 3 (Week 3): Pt will perform sit<>stands with max assist of 1 PT Short Term Goal 4 (Week 3): Pt will ambulate at least 7ft using LRAD with +2 mod assist  Skilled Therapeutic Interventions/Progress Updates:    Pt received supine in bed with RN exiting and pt agreeable to therapy session. Supine in bed, donned shorts with max assist for threading, lifts L LE in extended position due to tone, requires mod/max manual facilitation to achieve L LE hip/knee flexion for hooklying position (due to extensor tone) - able to bridge with facilitation to keep L LE flexed and pull shorts up over hips with min assist for L side. Supine>sitting R EOB, HOB flat but using bedrail, max cuing for logroll technique to prevent posterior LOB while coming upright - requires max assist for rolling then max assist for trunk upright while keeping it flexed forward. Sitting EOB with pt having frequent posterior LOB lying back into long sitting with pillows for support on bedrail during max assist for donning shirt and socks with shoes (no +2 available at this time). Sitting EOB had pt performed repeated trunk movements of forward trunk flexion to touch floor between his feet and then touch far R external target to promote anterior and R trunk movements due to pt's repeated L posterior LOB. L squat pivot EOB>TIS w/c with heavy mod assist for lifting/pivoting hips, blocking L LE extensor tone from causing his foot to slide forward out from under him, and keeping trunk flexed forward and  towards R.  Transported to/from gym in w/c for time management and energy conservation. R squat pivot TIS w/c>EOM with mod assist primarily for blocking L LE extensor tone and maintaining forward trunk flexion.   Sitting EOM participated in dynamic reaching task focusing on R lateral and forward trunk lean to grasp numbered object then place in ascending order on the mirror - pt demos much improved midline orientation with decreased pushing to complete this with close supervision/CGA.   Progressed to performing same task in standing while adding task of placing horseshoes on top of mirror to promote further trunk/hip extension for upright posture - pt starts with ability to complete this with max assist of 1 for lifting to stand, blocking L knee, and for balance due to strong L lean from R LE pushing but with increasing distraction in environment requires heavy +2 max assist to come to stand - once environment more calm pt able to revert back to only requiring 1 person max assist.   At end of session pt left sitting tilted back in TIS w/c with needs in reach and seat belt alarm on.   Therapy Documentation Precautions:  Precautions Precautions: Fall Precaution Comments: severe L hemi tone, R hemi dyskinesia (?), Pushing L, aphasia Restrictions Weight Bearing Restrictions: No   Pain: No reports of pain throughout session.    Therapy/Group: Individual Therapy  Tawana Scale , PT, DPT, CSRS 04/30/2021, 8:03 AM

## 2021-04-30 NOTE — Progress Notes (Signed)
Occupational Therapy Weekly Progress Note  Patient Details  Name: Howard White MRN: [id number] Date of Birth: November 05, 1961  Beginning of progress report period: April 23, 2021 End of progress report period: April 30, 2021  Today's Date: 04/30/2021 OT Individual Time: 7425-9563 OT Individual Time Calculation (min): 31 min    Patient has met 3 of 4 short term goals.  Howard White is making steady progress with OT at this time.  Overall, he continues to need min assist for UB bathing with max assist for UB dressing.  LB bathing is at a max assist level sit to stand with dressing at overall total assist.  Transfers squat pivot to the right are total assist secondary to increased pushing to the right with max assist to the left.  He continues to demonstrate flexed head and trunk with inability to achieve and maintain extension to neutral for more than 5-10 seconds before falling back into the flexed posture.  LUE tone continues to be present at a Modified [place] level 3 in the left elbow, wrist, and digits.  He is currently tolerating resting hand splint and elbow extension splint for wear at daytime and night based on schedule.  He continues with impulsivity and motor planning deficits as well.  When he is instructed from therapist that we are going to work on a task, he immediately tries to move impulsively instead of thinking about the steps involved to complete the task.  He continues with moderate pushing to the left in sitting with severe pushing in standing.  Feel overall he is progressing but will need extensive assist at discharge from his parents based on severity of the stroke and his increased tone.  Recommend continued CIR therapy at this time with expected discharge on 6/25 to progress toward mod assist level goals overall.    Patient continues to demonstrate the following deficits: muscle weakness, muscle joint tightness, and muscle paralysis, impaired timing and sequencing, abnormal tone,  unbalanced muscle activation, motor apraxia, decreased coordination, and decreased motor planning, decreased midline orientation and decreased motor planning, decreased attention, decreased awareness, decreased problem solving, decreased safety awareness, and delayed processing, and decreased sitting balance, decreased standing balance, decreased postural control, hemiplegia, and decreased balance strategies and therefore will continue to benefit from skilled OT intervention to enhance overall performance with BADL and Reduce care partner burden.  Patient progressing toward long term goals..  Continue plan of care.  OT Short Term Goals Week 3:  OT Short Term Goal 1 (Week 3): Continue working on estabilished LTGs set at [employer].  Skilled Therapeutic Interventions/Progress Updates:    Session 1: (8756-4332)  Pt in [place] wheelchair to start session.  He was able to work on oral hygiene in sitting with setup of the soft suction brush with toothpaste applied.  Once completed, worked on stretching of the LUE with PROM stretching to the shoulder extensors and internal rotators as well as the elbow flexors, wrist, flexors, and digit flexors.  Shoulder internal rotators at 2/4 as well as elbow flexors on the Modified [place]worth Scale.  Wrist and digit flexors still at 3/4 as well.  He reports some lateral shoulder pain with elbow extension and digit extension at times, likely related to the increased tone.  When instructed to slowly try and assist with digit extension, he was unable and finger flexion and wrist tightness would increase.  Finished session with pt sitting in the wheelchair and with the call button and phone in reach.    Session  2: (1421-1534)  Pt in tilt in space wheelchair to start session.  Took him down to the therapy gym where he completed stand pivot transfer to the therapy mat with total assist stand pivot.  He was unable to maintain upright posture through his trunk and head and  continued to get distracted trying to wipe out his wheelchair in the middle of the transfer.  Once on the mat had him focus on functional reaching to the right toward the pushing side in both sitting and standing.  Max demonstrational cueing to achieve and maintain anterior pelvic tilt with thoracic and cervical extension while completing reaching in sitting.  He needed total assist for standing when attempting to reach secondary to increased trunk and cervical flexion as well as inability to weightshift to the right to reach for target.  He completed several repetitions of this in standing.  He continues to exhibit akathisia where he cannot stop moving his right arm or right leg when told to relax. Finished session with total assist squat pivot transfer to the wheelchair with total assist stand pivot transfer back to the bed.  Resting hand and left elbow extension splint were donned as well.  Call button and phone in reach with safety alarm in place.    Therapy Documentation Precautions:  Precautions Precautions: Fall Precaution Comments: severe L hemi tone, R hemi dyskinesia (?), Pushing L, aphasia Restrictions Weight Bearing Restrictions: No  Pain: Pain Assessment Pain Scale: Faces Faces Pain Scale: Hurts a little bit Pain Type: Acute pain Pain Location: Arm Pain Orientation: Left Pain Descriptors / Indicators: Discomfort Pain Onset: With Activity Pain Intervention(s): Repositioned;Emotional support ADL: See Care Tool Section for some details of mobility and selfcare  Therapy/Group: Individual Therapy  Gunhild Bautch OTR/L 04/30/2021, 12:34 PM

## 2021-05-01 MED ORDER — DOCUSATE SODIUM 100 MG PO CAPS
200.0000 mg | ORAL_CAPSULE | Freq: Two times a day (BID) | ORAL | Status: DC
  Administered 2021-05-01 – 2021-05-05 (×9): 200 mg via ORAL
  Filled 2021-05-01 (×9): qty 2

## 2021-05-01 NOTE — Progress Notes (Signed)
Occupational Therapy Session Note  Patient Details  Name: Howard White MRN: [id number] Date of Birth: [birth year]  Today's Date: 05/01/2021 OT Individual Time: 7793-9030 OT Individual Time Calculation (min): 42 min    Skilled Therapeutic Interventions/Progress Updates:    Pt greeted sitting up in the TIS. He declined eating breakfast, opting instead to start session by brushing his teeth. Worked on trunk control and neutral cervical alignment while leaning forward towards sink to reach for needed items (TIS placed in neutral position). Noted limited ability to reach towards backmost section of sink. It was difficult to involve [name] side due to severe hypertonicity. Used his hand as a gross stabilizer for toothpaste + toothbrush. Needed to use sanitizer to wash his hand and we discussed importance of completing daily hygiene to affected UE for skin integrity. Afterwards worked on pt education and [employer]. Applied MHP to his elbow and performed rocking technique to decrease tone. Pt assisting OT with gently rocking limb with vcs. Cues also for utilizing relaxation techniques to decrease muscular tension. Pt with a great improvement in elbow extension as session progressed, had him look at/feel crease of elbow. Skin intact once MHP was removed. Pt remained sitting up in the TIS, tilted back for comfort/safety with full lap tray, wearing safety belt, call bell in hand.   Therapy Documentation Precautions:  Precautions Precautions: Fall Precaution Comments: severe L hemi tone, R hemi dyskinesia (?), Pushing L, aphasia Restrictions Weight Bearing Restrictions: No   Pain: in the [name] related to tone, utilized gentle stretching to address   ADL: ADL Eating: Maximal assistance Where Assessed-Eating: Bed level Grooming: Maximal assistance Where Assessed-Grooming: Bed level Upper Body Bathing: Maximal assistance Where Assessed-Upper Body Bathing: Edge of bed, Bed level Lower Body Bathing:  Other (comment) (total +2) Where Assessed-Lower Body Bathing: Edge of bed, Bed level Upper Body Dressing: Maximal assistance Where Assessed-Upper Body Dressing: Edge of bed Lower Body Dressing: Other (Comment) (total +2) Where Assessed-Lower Body Dressing: Bed level Toileting: Other (Comment) (total assist) Where Assessed-Toileting: Bed level Toilet Transfer Method: Not assessed Tub/Shower Transfer: Not assessed Gaffer Transfer: Not assessed     Therapy/Group: Individual Therapy  Luisangel Wainright A Carlicia Leavens 05/01/2021, 12:07 PM

## 2021-05-01 NOTE — Progress Notes (Signed)
Physical Therapy Session Note  Patient Details  Name: Howard White MRN: [id number] Date of Birth: 11/15/1961  Today's Date: 05/01/2021 PT Individual Time: 9242-6834 PT Individual Time Calculation (min): 56 min   Short Term Goals: Week 3:  PT Short Term Goal 1 (Week 3): Pt will consistently perform supine<>sit with mod assist PT Short Term Goal 2 (Week 3): Pt will consistently perform bed<>chair transfers with mod assist of 1 PT Short Term Goal 3 (Week 3): Pt will perform sit<>stands with max assist of 1 PT Short Term Goal 4 (Week 3): Pt will ambulate at least 72ft using LRAD with +2 mod assist  Skilled Therapeutic Interventions/Progress Updates:    Patient received supine in bed, NT present, agreeable to PT. He denies pain. PT doffing L UE splints per wearing schedule. He was found to be incontinent of bladder requiring TotalA pericare at bedlevel. He was able to roll L with MinA and verbal cues for motor planning, but required TotalA to rolling R. Significant tone noted in L LE.Per RN, had not received rx, but was able to provide partway through session. Patient coming to sit edge of bed with TotalA x1, but found to have too much extensor tone in both L LE and trunk to allow for safe transfer to wc via squat pivot. Patient laying back down to place MaxiMove sling to transfer to wc. Once in chair, patient received rx and had significant coughing episode after receiving liquid rx that was in syringe. RN present and SLP made aware. PT transporting patient in wc to therapy gym for time management and energy conservation. TotalA x2 squat pivot to therapy mat. Initially he required TotalA to maintain upright posture with mirror for visual feedback on posture. Patient eventually able to maintain static sitting balance with close supervision CGA for ~1-2 mins at a time. Engaged in dynamic reaching task to the R emphasizing R weight shift with thoracic/cervical extension to reach horseshoe. Patient then  handing horseshoe to PT on L to encourage truncal rotation- which patient was minimally able to achieve. Patient completing sit <> stand x2 from edge of mat with ModA + MinA 3 musketeers support. Patient with evidence of mild pushing with R LE in standing, but was able to achieve upright posture with cues to look into mirror. Patient transferring back to wc MaxA x2 squat pivot. Patient remaining up in wc, seatbelt alarm on, tilted back, full lap tray in place, call light within reach.    Therapy Documentation Precautions:  Precautions Precautions: Fall Precaution Comments: severe L hemi tone, R hemi dyskinesia (?), Pushing L, aphasia Restrictions Weight Bearing Restrictions: No    Therapy/Group: Individual Therapy  Karoline Caldwell, PT, DPT, CBIS  05/01/2021, 7:39 AM

## 2021-05-01 NOTE — Progress Notes (Signed)
PROGRESS NOTE   Subjective/Complaints: No new problems reported today. Alert. Appears comfortable  ROS: limited due to language/communication     Objective:   No results found.  No results for input(s): WBC, HGB, HCT, PLT in the last 72 hours.  No results for input(s): NA, K, CL, CO2, GLUCOSE, BUN, CREATININE, CALCIUM in the last 72 hours.   Intake/Output Summary (Last 24 hours) at 05/01/2021 1202 Last data filed at 04/30/2021 1731 Gross per 24 hour  Intake 377 ml  Output --  Net 377 ml        Physical Exam: Vital Signs Blood pressure 98/63, pulse (!) 52, temperature 98.3 F (36.8 C), temperature source Oral, resp. rate 16, height 5\' 10"  (1.778 m), weight 79.6 kg, SpO2 98 %.   Constitutional: No distress . Vital signs reviewed. HEENT: EOMI, oral membranes moist Neck: supple Cardiovascular: RRR without murmur. No JVD    Respiratory/Chest: CTA Bilaterally without wheezes or rales. Normal effort    GI/Abdomen: BS +, non-tender, non-distended Ext: no clubbing, cyanosis, or edema Psych: pleasant and cooperative  Neurologic:Motor 0/5 LUE, LLE 2-3/5--no change Tone  MAS 4 in Left biceps, wrist and finger flexors , MAS 2-3 Left knee ext tone , MAS 3 at Left ankle inverters--tone unchanged 6/17 Musculoskeletal: reduced Left elbow ext due to tone, wrist and fingers in flexed position on L side unchanged    Assessment/Plan: 1. Functional deficits which require 3+ hours per day of interdisciplinary therapy in a comprehensive inpatient rehab setting. Physiatrist is providing close team supervision and 24 hour management of active medical problems listed below. Physiatrist and rehab team continue to assess barriers to discharge/monitor patient progress toward functional and medical goals  Care Tool:  Bathing    Body parts bathed by patient: Abdomen, Chest, Left arm, Front perineal area, Right upper leg, Left upper leg,  Right lower leg, Left lower leg, Face   Body parts bathed by helper: Buttocks, Right arm Body parts n/a: Left lower leg, Right lower leg, Right arm, Left arm, Front perineal area, Buttocks   Bathing assist Assist Level: Maximal Assistance - Patient 24 - 49% (sit to stand)     Upper Body Dressing/Undressing Upper body dressing   What is the patient wearing?: Pull over shirt    Upper body assist Assist Level: Maximal Assistance - Patient 25 - 49%    Lower Body Dressing/Undressing Lower body dressing      What is the patient wearing?: Pants, Incontinence brief     Lower body assist Assist for lower body dressing: Maximal Assistance - Patient 25 - 49%     Toileting Toileting    Toileting assist Assist for toileting: 2 Helpers     Transfers Chair/bed transfer  Transfers assist  Chair/bed transfer activity did not occur: Safety/medical concerns  Chair/bed transfer assist level: Moderate Assistance - Patient 50 - 74% (squat pivot)     Locomotion Ambulation   Ambulation assist   Ambulation activity did not occur: Safety/medical concerns  Assist level: 2 helpers (+2 max A) Assistive device: Other (comment) (3 Musketeer) Max distance: 108ft   Walk 10 feet activity   Assist  Walk 10 feet activity did not occur:  Safety/medical concerns        Walk 50 feet activity   Assist Walk 50 feet with 2 turns activity did not occur: Safety/medical concerns         Walk 150 feet activity   Assist Walk 150 feet activity did not occur: Safety/medical concerns         Walk 10 feet on uneven surface  activity   Assist Walk 10 feet on uneven surfaces activity did not occur: Safety/medical concerns         Wheelchair     Assist Will patient use wheelchair at discharge?: Yes Type of Wheelchair: Manual Wheelchair activity did not occur: Safety/medical concerns         Wheelchair 50 feet with 2 turns activity    Assist    Wheelchair 50 feet  with 2 turns activity did not occur: Safety/medical concerns       Wheelchair 150 feet activity     Assist  Wheelchair 150 feet activity did not occur: Safety/medical concerns       Blood pressure 98/63, pulse (!) 52, temperature 98.3 F (36.8 C), temperature source Oral, resp. rate 16, height 5\' 10"  (1.778 m), weight 79.6 kg, SpO2 98 %.    Medical Problem List and Plan: 1.  L hemiplegia worsening due to recurrent R CR/BG infarct, has associated spasticity and contracture formation - spasticity has worsened significantly with new infarct , cognitive status declined          Continue CIR PT, OT, SLP, speech dysarthric hypophonic and delayed Appreciate neuro input  Con't PT, OT and SLP          ELOS 6/25- goals at least mod A goals- family interested in extension--may be possible depending upon his progress--team to revisit early next week  2.  Antithrombotics: -DVT/anticoagulation:  Pharmaceutical: Lovenox             -antiplatelet therapy: DAPT x3 weeks followed by ASA alone. 3. Pain Management: Continue Tylenol as needed  6/17 appears comfortable 4. Depression: LCSW to follow for evaluation and support. Increase Lexapro to 20mg , continue             -antipsychotic agents: N/AA 5. Neuropsych: This patient is not fully capable of making decisions on his own behalf. Neurostim with amantadine check with team in am , may increase dose 6. Skin/Wound Care: Routine pressure-relief measures. 7. Fluids/Electrolytes/Nutrition: Monitor intake/output.  nl BUN/Cr 8.  History of seizure disorder: Continue Lamictal twice daily 9.  Left spastic hemiparesis: increase baclofen 15 mg 4 times daily-  Had incobotulinum toxin A injection 300U on 04/22/21 to elbow flexors, wrist and finger flexors 6/16-17- s/p botoxulinum toxin 6/8- continue to monitor for effect. Tone remains quite significant however. 10.  History of anxiety disorder: Continue Lexapro.  Hydroxyzine 3 times daily. 11.  Insomnia:  Continue melatonin at bedtime.Change trazodone to Seroquel as per neuro.  12.  Hypothyroid: Stable on supplement.  13. Leukocytosis without fever-resolved  14. Elevated LFTs: d/c Lipitor, f/u CMET essentially resolved  15. AKI: resolved , drinking a little more  16.  Dysphagia- poor posture in bed may need to get in [facility] to eat- no signs of aspiration  17.  Episode of urinary retention last week requiring cath will monitor with daily PVRs  -incontinent but voiding regularly   LOS: 17 days A FACE TO FACE EVALUATION WAS PERFORMED  Meredith Staggers 05/01/2021, 12:02 PM

## 2021-05-01 NOTE — Progress Notes (Signed)
Speech Language Pathology Daily Session Note  Patient Details  Name: Howard White MRN: [id number] Date of Birth: April 20, 1961  Today's Date: 05/01/2021 SLP Individual Time: 1005-1045 SLP Individual Time Calculation (min): 40 min  Short Term Goals: Week 1: SLP Short Term Goal 1 (Week 1): Pt will demonstrate sustained attention to functional tasks for 4-5 minutes with min A verbal cues. SLP Short Term Goal 1 - Progress (Week 1): Not met SLP Short Term Goal 2 (Week 1): Pt will respond to functional yes/no questions verbally or with gestures with 80% accuracy mod A visual cues. SLP Short Term Goal 2 - Progress (Week 1): Not met SLP Short Term Goal 3 (Week 1): Pt will tolerate dys 3 diet and thin liquids with no overt s/sx of aspiration or penetration on 9/10 trials. SLP Short Term Goal 3 - Progress (Week 1): Not met SLP Short Term Goal 4 (Week 1): Pt will follow basic one step commands in 60% of opportunities with mod A verbal cues. SLP Short Term Goal 4 - Progress (Week 1): Not met SLP Short Term Goal 5 (Week 1): Pt will utilize an increased vocal intensity at the word level to achieve 50% intelligibility with max a visual and verbal cues. SLP Short Term Goal 5 - Progress (Week 1): Not met  Skilled Therapeutic Interventions:   Patient seen for skilled ST session focusing on cognitive-linguistic goals. Patient sitting up in [facility] and not c/o pain. PT did speak with SLP prior to session secondary to patient with coughing episode following PO of thin liquid medications. SLP spoke with nursing and plan is to request liquid medications be changed to tablet. During today's session, patient required only supervision for overall attention to novel task. He did have difficulty with transition to next item in activity and would often start discussing previous item instead. Voice was adequate in intensity when patient initially communicating with SLP, however this quickly declined and patient required mod-max A  cues to increase vocal intensity at short phrase level. Patient continues to benefit from skilled SLP intervention to maximize speech, swallow and cognitive function prior to discharge.  Pain Pain Assessment Pain Scale: 0-10 Faces Pain Scale: No hurt Pain Type: Chronic pain Pain Location: Arm Pain Orientation: Left Pain Descriptors / Indicators: Discomfort Pain Onset: With Activity Pain Intervention(s): Repositioned  Therapy/Group: Individual Therapy  Sonia Baller, MA, CCC-SLP Speech Therapy

## 2021-05-01 NOTE — Progress Notes (Signed)
Occupational Therapy Session Note  Patient Details  Name: Howard White MRN: [id number] Date of Birth: 06-02-1961  Today's Date: 05/01/2021 OT Individual Time: 1093-2355 OT Individual Time Calculation (min): 77 min    Short Term Goals: Week 3:  OT Short Term Goal 1 (Week 3): Continue working on estabilished LTGs set at [employer].  Skilled Therapeutic Interventions/Progress Updates:    Pt in bed to start with work on transfer to the EOB with max assist to start and then squat pivot transfer to the left with mod assist.  He continues to need max demonstrational cueing for forward lean and weightshift to the right.  He then completed stand pivot transfer to the 3:1 over the toilet with total assist stand pivot and max demonstrational cueing.  He needed max assist for clothing management sit to stand.  He was not successful with BM or urination, but noted slight bladder incontinence in the brief.  He was able to complete short distance mobility over to the tub bench with total assist approximately 4-5'.  He was able to complete undressing with max assist sit to stand, min assist for doffing his pullover shirt.  He was able to complete shower at max assist sit to stand with use of the grab bar for support.  Mod demonstrational cueing for sequencing as he would continue to wash his face with the soapy washcloth, even when he had already completed it.  Increased flexor tone noted in the left elbow and digits as well as increased extensor tone in the LLE.  He was able to dry off and then complete stand pivot transfer to the wheelchair with total assist.  He was able to work on dressing sit to stand at the sink.  Mod assist for threading underpants and pants with max assist for sit to stand.  He continues to need max demonstrational cueing to keep his head and trunk extended with sit to stand transitions.  Increased cervical and trunk flexion noted with standing.  He donned his pullover shirt with mod  assist after therapist gathered up the left shirt sleeve.  Finished session with return to the bed at total assist level stand pivot.  Therapist attempted NMES to the left digit and elbow extensors to help reduce tone, however he reported increased pain in his left forearm and shoulder with stimulation so it was halted.  Resting hand splint was on the left hand as well as elbow extension splint.  Call button and phone in reach with safety alarm belt and bed alarm in place.    Therapy Documentation Precautions:  Precautions Precautions: Fall Precaution Comments: severe L hemi tone, R hemi dyskinesia (?), Pushing L, aphasia Restrictions Weight Bearing Restrictions: No  Pain: Pain Assessment Pain Scale: Faces Faces Pain Scale: Hurts a little bit Pain Type: Chronic pain Pain Location: Arm Pain Orientation: Left Pain Descriptors / Indicators: Discomfort Pain Onset: With Activity Pain Intervention(s): Repositioned ADL: See Care Tool Section for some details of mobility and selfcare  Therapy/Group: Individual Therapy  Diannah Rindfleisch OTR/L 05/01/2021, 3:37 PM

## 2021-05-02 NOTE — Progress Notes (Signed)
Speech Language Pathology Daily Session Note  Patient Details  Name: Howard White MRN: [id number] Date of Birth: 19-May-1961  Today's Date: 05/02/2021 SLP Individual Time: 3578-9784 SLP Individual Time Calculation (min): 45 min  Short Term Goals: Week 3: SLP Short Term Goal 1 (Week 3): Patient will tolerate trials of Dys 3 solids textures with minA for clearing oral pocketing/residuals. SLP Short Term Goal 2 (Week 3): Patient will achieve adequate vocal intensity to produce 3-4 word phrases with modA cues. SLP Short Term Goal 3 (Week 3): Patient will demonstrate awareness to errors during functional tasks with mod-maxA cues. SLP Short Term Goal 4 (Week 3): Patient will demonstrated sustained attention to basic level task with minA to redirect.  Skilled Therapeutic Interventions: Pt seen for skilled ST with focus on speech and swallowing goals, RN assisting SLP to reposition patient in bed. Ongoing assessment of Dys 2 tolerance performed, pt independent for recall and min A for use of trained swallow strategies. Thin liquids via Provale cup with no s/s aspiration or change in vocal quality. Discussed with patient the possibility of instrumental swallow examination d/t fluctuating swallow function during CIR stay, pt verbalizing understanding. Pt continues to require mod-max A to utilize compensatory strategies to increase intelligibility including reducing phrases to 2-3 words, over articulate and increase intensity. Pt intelligibility increases during structured tasks vs unstructured tasks. Pt benefits from mod cues to ID/increase awareness of communication breakdowns. Pt left in bed with alarm set and all needs met. Cont ST POC.   Pain Pain Assessment Pain Scale: 0-10 Pain Score: 0-No pain  Therapy/Group: Individual Therapy  Dewaine Conger 05/02/2021, 11:12 AM

## 2021-05-02 NOTE — Progress Notes (Signed)
Physical Therapy Session Note  Patient Details  Name: Howard White MRN: [id number] Date of Birth: [birth year]  Today's Date: 05/02/2021 PT Individual Time: 1305-1405 PT Individual Time Calculation (min): 60 min   Short Term Goals: Week 3:  PT Short Term Goal 1 (Week 3): Pt will consistently perform supine<>sit with mod assist PT Short Term Goal 2 (Week 3): Pt will consistently perform bed<>chair transfers with mod assist of 1 PT Short Term Goal 3 (Week 3): Pt will perform sit<>stands with max assist of 1 PT Short Term Goal 4 (Week 3): Pt will ambulate at least 56ft using LRAD with +2 mod assist  Skilled Therapeutic Interventions/Progress Updates:    Pt received supine in bed with RN present for medication administration and pt agreeable to therapy session. Pt noted to have increased tone in L LE requiring heavy total assist to flex hip/knee and bring into hooklying position today. Pt noted to be incontinent of bladder - rolling L with min assist and R with max assist after flexing L LE - set-up assist anterior peri-care with thearpist ensuring cleanliness and total assist posterior peri-care. In supine, donned shorts max assist for threading on LEs and min assist for pulling up over hips in hooklying position with therapist facilitating L LE flexed positioning to allow pt to bridge. Supine>sitting R EOB, HOB flat but bedrail available, via logroll technique with max assist for B LE management and trunk upright - cuing to maintain anterior trunk lean/weight shift. Sitting EOB with 2x pt leaning posteriorly onto pillows while donning shirt with max assist but otherwise pt able to maintain upright (no posterior LOB) with verbal cuing while therapist providing max assist for donning socks and shoes. L squat pivot EOB>TIS w/c with heavy mod assist of 1 for lifting/pivoting hips and maintaining R anterior trunk lean/weight shift and blocking L LE extensor tone.  Transported to/from gym in w/c for time  management and energy conservation. R squat pivot w/c>EOM with light mod assist of 1 for pivoting hips and maintaining anterior trunk lean/weight shift to allow pt to clear hips.   Sitting EOM participated in dynamic reaching task to grasp object from high target on R (promoting R weight shift for decreased pushing) then placing on matching object on pt's L side to encouraged trunk rotation - with fatigue pt having minor L posterior LOB but pt able to use R UE to return to upright in midline with only min assist from therapist.   Progressed to performing similar task in standing by having pt retrieve objects on low surface in front of pt to promote anterior trunk lean/weight shift then come to stand to place on matching object in standing with R bias to promote R weight shift and L LE knee extension - requires +2 max assist to stand initially but progresses to only heavy max A of 1 due strong L posterior lean - cuing for improvement.  Sitting EOM therapist performed ~53minutes of L UE gentle passive ROM focusing on elbow extension, wrist extension to neutral, finger extension, and forearm pronation - placed custom hand/wrist splint and elbow brace per schedule - notified RN to doff per schedule.   L squat pivot EOM>w/c with heavy mod assist as described above. Transported back to nurses station where pt left tilted back in TIS w/c with needs in reach and seat belt alarm on.   Unbilled time: Thearpist returned 1 hour later to assist pt back to bed per nursing staff request - R squat pivot w/c>EOB mod  assist as described above. Sit>R sidelying heavy mod assist for B LE management. Pt left in care of nurse.   Therapy Documentation Precautions:  Precautions Precautions: Fall Precaution Comments: severe L hemi tone, R hemi dyskinesia (?), Pushing L, aphasia Restrictions Weight Bearing Restrictions: No   Pain: Reports some L anterior shoulder pain during PROM - modified technique to manage pain.    Therapy/Group: Individual Therapy  Tawana Scale , PT, DPT, CSRS 05/02/2021, 12:13 PM

## 2021-05-03 MED ORDER — ATORVASTATIN CALCIUM 10 MG PO TABS
20.0000 mg | ORAL_TABLET | Freq: Every day | ORAL | Status: DC
  Administered 2021-05-03 – 2021-05-09 (×7): 20 mg via ORAL
  Filled 2021-05-03 (×7): qty 2

## 2021-05-03 NOTE — Progress Notes (Signed)
9                                                        PROGRESS NOTE   Subjective/Complaints:  Awakens to voice but still drowsy  ROS: limited due to language/communication     Objective:   No results found.  No results for input(s): WBC, HGB, HCT, PLT in the last 72 hours.  No results for input(s): NA, K, CL, CO2, GLUCOSE, BUN, CREATININE, CALCIUM in the last 72 hours.   Intake/Output Summary (Last 24 hours) at 05/03/2021 0625 Last data filed at 05/02/2021 2040 Gross per 24 hour  Intake 557 ml  Output --  Net 557 ml         Physical Exam: Vital Signs Blood pressure 117/76, pulse 61, temperature 98 F (36.7 C), temperature source Oral, resp. rate 20, height 5\' 10"  (1.778 m), weight 79.6 kg, SpO2 94 %.    General: No acute distress Mood and affect are appropriate Heart: Regular rate and rhythm no rubs murmurs or extra sounds Lungs: Clear to auscultation, breathing unlabored, no rales or wheezes Abdomen: Positive bowel sounds, soft nontender to palpation, nondistended Extremities: No clubbing, cyanosis, or edema Skin: No evidence of breakdown, no evidence of rash  Neurologic:Motor 0/5 LUE, LLE 2-3/5--no change Tone  MAS 2 in Left biceps, 3 inwrist and finger flexors , MAS 2-3 Left knee ext tone , MAS 3 at Left ankle inverters--tone unchanged 6/17 Musculoskeletal: reduced Left elbow ext due to tone, wrist and fingers in flexed position on L side unchanged    Assessment/Plan: 1. Functional deficits which require 3+ hours per day of interdisciplinary therapy in a comprehensive inpatient rehab setting. Physiatrist is providing close team supervision and 24 hour management of active medical problems listed below. Physiatrist and rehab team continue to assess barriers to discharge/monitor patient progress toward functional and medical goals  Care Tool:  Bathing    Body parts bathed by patient: Left arm, Chest, Abdomen, Front perineal area, Right upper leg, Left  upper leg, Right lower leg, Face, Left lower leg   Body parts bathed by helper: Buttocks, Right arm Body parts n/a: Left lower leg, Right lower leg, Right arm, Left arm, Front perineal area, Buttocks   Bathing assist Assist Level: Minimal Assistance - Patient > 75% (sitting without standing)     Upper Body Dressing/Undressing Upper body dressing   What is the patient wearing?: Pull over shirt    Upper body assist Assist Level: Moderate Assistance - Patient 50 - 74%    Lower Body Dressing/Undressing Lower body dressing      What is the patient wearing?: Pants, Incontinence brief     Lower body assist Assist for lower body dressing: Maximal Assistance - Patient 25 - 49%     Toileting Toileting    Toileting assist Assist for toileting: Total Assistance - Patient < 25%     Transfers Chair/bed transfer  Transfers assist  Chair/bed transfer activity did not occur: Safety/medical concerns  Chair/bed transfer assist level: Moderate Assistance - Patient 50 - 74% (squat pivot)     Locomotion Ambulation   Ambulation assist   Ambulation activity did not occur: Safety/medical concerns  Assist level: 2 helpers (+2 max A) Assistive device: Other (comment) (3 Musketeer) Max distance: 50ft   Walk 10 feet activity  Assist  Walk 10 feet activity did not occur: Safety/medical concerns        Walk 50 feet activity   Assist Walk 50 feet with 2 turns activity did not occur: Safety/medical concerns         Walk 150 feet activity   Assist Walk 150 feet activity did not occur: Safety/medical concerns         Walk 10 feet on uneven surface  activity   Assist Walk 10 feet on uneven surfaces activity did not occur: Safety/medical concerns         Wheelchair     Assist Will patient use wheelchair at discharge?: Yes Type of Wheelchair: Manual Wheelchair activity did not occur: Safety/medical concerns         Wheelchair 50 feet with 2 turns  activity    Assist    Wheelchair 50 feet with 2 turns activity did not occur: Safety/medical concerns       Wheelchair 150 feet activity     Assist  Wheelchair 150 feet activity did not occur: Safety/medical concerns       Blood pressure 117/76, pulse 61, temperature 98 F (36.7 C), temperature source Oral, resp. rate 20, height 5\' 10"  (1.778 m), weight 79.6 kg, SpO2 94 %.    Medical Problem List and Plan: 1.  L hemiplegia worsening due to recurrent R CR/BG infarct, has associated spasticity and contracture formation - spasticity has worsened significantly with new infarct , cognitive status declined          Continue CIR PT, OT, SLP, speech dysarthric hypophonic and delayed Appreciate neuro input  Con't PT, OT and SLP          ELOS 6/25- goals at least mod A goals- family interested in extension--may be possible depending upon his progress--team to revisit early next week  2.  Antithrombotics: -DVT/anticoagulation:  Pharmaceutical: Lovenox             -antiplatelet therapy: DAPT x3 weeks followed by ASA alone. 3. Pain Management: Continue Tylenol as needed  6/17 appears comfortable 4. Depression: LCSW to follow for evaluation and support. Increase Lexapro to 20mg , continue             -antipsychotic agents: N/AA 5. Neuropsych: This patient is not fully capable of making decisions on his own behalf. Neurostim with amantadine check with team in am , may increase dose 6. Skin/Wound Care: Routine pressure-relief measures. 7. Fluids/Electrolytes/Nutrition: Monitor intake/output.  nl BUN/Cr 8.  History of seizure disorder: Continue Lamictal twice daily 9.  Left spastic hemiparesis: increase baclofen 15 mg 4 times daily-  Had incobotulinum toxin A injection 300U on 04/22/21 to elbow flexors, wrist and finger flexors 6/19- spasticity improved at biceps more so than at wrist and finger flexors now can range without pain 10.  History of anxiety disorder: Continue Lexapro.   Hydroxyzine 3 times daily. 11.  Insomnia: Continue melatonin at bedtime.Change trazodone to Seroquel as per neuro.  12.  Hypothyroid: Stable on supplement.  13. Leukocytosis without fever-resolved  14. Elevated LFTs: d/c Lipitor, f/u CMET essentially resolved , will resume at lower dose 20mg  15. AKI: resolved , drinking a little more  16.  Dysphagia- poor posture in bed may need to get in [facility] to eat- no signs of aspiration  17.  Episode of urinary retention last week requiring cath will monitor with daily PVRs  -incontinent but voiding regularly   LOS: 19 days A FACE TO FACE EVALUATION WAS PERFORMED  Luanna Salk Crucita Lacorte  05/03/2021, 6:25 AM

## 2021-05-03 NOTE — Progress Notes (Signed)
Telesitter called, patient restless and trying to get out of bed. Pt stated he is going home. Pt removed L hand splints and prafo boot. Has incontinence episode. Pericare done. Splints reapplied but pt  removed again and was resistant to staff. PRN med given and patient resting/asleep now.

## 2021-05-04 LAB — COMPREHENSIVE METABOLIC PANEL
ALT: 30 U/L (ref 0–44)
AST: 27 U/L (ref 15–41)
Albumin: 4 g/dL (ref 3.5–5.0)
Alkaline Phosphatase: 73 U/L (ref 38–126)
Anion gap: 11 (ref 5–15)
BUN: 20 mg/dL (ref 6–20)
CO2: 29 mmol/L (ref 22–32)
Calcium: 9.9 mg/dL (ref 8.9–10.3)
Chloride: 101 mmol/L (ref 98–111)
Creatinine, Ser: 1.24 mg/dL (ref 0.61–1.24)
GFR, Estimated: >60 mL/min (ref >60)
Glucose, Bld: 87 mg/dL (ref 70–99)
Potassium: 3.6 mmol/L (ref 3.5–5.1)
Sodium: 141 mmol/L (ref 135–145)
Total Bilirubin: 1 mg/dL (ref 0.3–1.2)
Total Protein: 6.3 g/dL — ABNORMAL LOW (ref 6.5–8.1)

## 2021-05-04 LAB — CBC
HCT: 48.4 % (ref 39.0–52.0)
Hemoglobin: 16.2 g/dL (ref 13.0–17.0)
MCH: 31.2 pg (ref 26.0–34.0)
MCHC: 33.5 g/dL (ref 30.0–36.0)
MCV: 93.3 fL (ref 80.0–100.0)
Platelets: 333 10*3/uL (ref 150–400)
RBC: 5.19 MIL/uL (ref 4.22–5.81)
RDW: 12.1 % (ref 11.5–15.5)
WBC: 6.5 10*3/uL (ref 4.0–10.5)
nRBC: 0 % (ref 0.0–0.2)

## 2021-05-04 NOTE — Progress Notes (Signed)
Speech Language Pathology Daily Session Note  Patient Details  Name: Howard White MRN: [id number] Date of Birth: 07-21-1961  Today's Date: 05/04/2021 SLP Individual Time: 3685-9923 SLP Individual Time Calculation (min): 41 min  Short Term Goals: Week 3: SLP Short Term Goal 1 (Week 3): Patient will tolerate trials of Dys 3 solids textures with minA for clearing oral pocketing/residuals. SLP Short Term Goal 2 (Week 3): Patient will achieve adequate vocal intensity to produce 3-4 word phrases with modA cues. SLP Short Term Goal 3 (Week 3): Patient will demonstrate awareness to errors during functional tasks with mod-maxA cues. SLP Short Term Goal 4 (Week 3): Patient will demonstrated sustained attention to basic level task with minA to redirect.  Skilled Therapeutic Interventions:Skilled ST services focused on swallow and cognitive skills. Pt demonstrated x1 delayed throat clear when consuming thin liquid via 67ml provale cup. SLP will plan for MBS prior to discharge to assess liquid tolerances with fluctuating s/s of aspiration. SLP facilitated sustained attention in novel basic problem solving card tasks. Pt was able to sort cards in a field of 6 by color and shape with min A verbal cues for error awareness and played "blink" at basic level with supervision A verbal cues for error awareness. Pt demonstrated sustained attention up to 10 minutes with min A verbal cues for redirection. Pt was left in room with call bell within reach and chair alarm set. SLP recommends to continue skilled services.     Pain Pain Assessment Pain Score: 0-No pain  Therapy/Group: Individual Therapy  Augustine Brannick  [facility] 05/04/2021, 3:58 PM

## 2021-05-04 NOTE — Progress Notes (Signed)
Occupational Therapy Session Note  Patient Details  Name: Howard White MRN: [id number] Date of Birth: 09-27-1961  Today's Date: 05/04/2021 OT Individual Time: 1110-1208 OT Individual Time Calculation (min): 58 min    Short Term Goals: Week 2:  OT Short Term Goal 1 (Week 2): Pt will complete rolling in the bed with max assist to the right and mod assist to the left to assist with toileting tasks and LB clothing management. OT Short Term Goal 1 - Progress (Week 2): Met OT Short Term Goal 2 (Week 2): Pt will tolerate hand splint for positioning of the LUE into slight extension. OT Short Term Goal 2 - Progress (Week 2): Met OT Short Term Goal 3 (Week 2): Pt will complete LB dressing sit to stand with max assist. OT Short Term Goal 3 - Progress (Week 2): Met OT Short Term Goal 4 (Week 2): Pt will complete UB dressing with mod assist. OT Short Term Goal 4 - Progress (Week 2): Not met  Skilled Therapeutic Interventions/Progress Updates:    Pt up in the tilt in space wheelchair to start session.  He was taken down to the therapy gym where the standing frame was utilized to work on upright posture and increased weightbearing trough the LLE.  While standing for intervals of 10 mins, therapist had to provide max facilitation for maintaining upright trunk and cervical extension with head a midline, as he cannot maintain it.  Also worked on weightshifting to the right pushing side in standing with incorporation of functional reaching.  Completed light stretching to the left elbow, wrist, and hand in order to place the UE into light weightbearing on the forearm while reaching.  Finished session with return to the room and pt's mother present.  Began education with donning left resting hand splint and elbow extension splint.  Noted velcro strapping coming off of the elbow splint as well as strapping rings coming loose.  Megan from [facility] [facility] notified and will look at repairing as needed.  Pt left up in the  wheelchair with splints in place and mother and home care reps in the room.  Call button in reach.    Therapy Documentation Precautions:  Precautions Precautions: Fall Precaution Comments: severe L hemi tone, R hemi dyskinesia (?), Pushing L, aphasia Restrictions Weight Bearing Restrictions: No  Pain: Pain Assessment Pain Scale: Faces Pain Score: 0-No pain ADL: See Care Tool Section for some details of mobility and selfcare   Therapy/Group: Individual Therapy  Nithya Meriweather OTR/L 05/04/2021, 12:48 PM

## 2021-05-04 NOTE — Progress Notes (Signed)
Orth tech notified to assist with placement of Bledsoe brace, informed by technician he would have to call and have  the outside vendor come in, because this is not done through [facility] , Will follow up with 7a CN.

## 2021-05-04 NOTE — Progress Notes (Signed)
Physical Therapy Session Note  Patient Details  Name: Howard White MRN: [id number] Date of Birth: 1961-04-18  Today's Date: 05/04/2021 PT Individual Time: 0900-1013 PT Individual Time Calculation (min): 73 min   Short Term Goals: Week 3:  PT Short Term Goal 1 (Week 3): Pt will consistently perform supine<>sit with mod assist PT Short Term Goal 2 (Week 3): Pt will consistently perform bed<>chair transfers with mod assist of 1 PT Short Term Goal 3 (Week 3): Pt will perform sit<>stands with max assist of 1 PT Short Term Goal 4 (Week 3): Pt will ambulate at least 42ft using LRAD with +2 mod assist  Skilled Therapeutic Interventions/Progress Updates:    Patient received reclined in bed, pushing hard toward the L side of the bed with R UE, agreeable to PT. He denies pain. Patient found to be incontinent of urine requiring TotalA perihygiene at bedlevel. He remains able to roll L with ~CGA/MinA, but requires TotalA to roll R. MaxA to don pants and shirt at bed level as well. Tone playing a significant limiting factor in patients independence with mobility this AM. 3+-4 MAS in L LE with significant difficulty flexing L LE. Due to this significant extensor tone in L LE and strong pushing when supine in the bed, PT opting to use MaxiMove to transfer to TIS wc. PT transporting patient in wc to therapy gym for time management and energy conservation. Tone management in L LE completed with patient sitting in wc. Deep pressure and gentle oscillations provided to L quad to allow for appropriate flexion ROM. When patient would complete LAQ AROM into flexion, he would recruit even B Ues pushing with RUE against wc armrest and flexing L UE and even then was only able to complete ~15% AROM without assist. Prolonged stretch into flexion provided to L LE with some resolution of tone, but minimal. PT also providing prolonged stretch to L UE elbow/finger/wrist flexors. Patient reports taking off L UE splints because he  "wanted to"last night. Patient returning to room in wc, seatbelt on, seatbelt alarm on, call light within reach, tilted back in chair.   Therapy Documentation Precautions:  Precautions Precautions: Fall Precaution Comments: severe L hemi tone, R hemi dyskinesia (?), Pushing L, aphasia Restrictions Weight Bearing Restrictions: No    Therapy/Group: Individual Therapy  Karoline Caldwell, PT, DPT, CBIS  05/04/2021, 7:38 AM

## 2021-05-04 NOTE — Progress Notes (Signed)
Occupational Therapy Session Note  Patient Details  Name: Howard White MRN: [id number] Date of Birth: July 20, 1961  Today's Date: 05/04/2021 OT Individual Time: 1445-1535 OT Individual Time Calculation (min): 50 min    Short Term Goals: Week 3:  OT Short Term Goal 1 (Week 3): Continue working on estabilished LTGs set at [employer].  Skilled Therapeutic Interventions/Progress Updates:    Patient seated in TIS w/c, alert and states that he is tired and would like to go to bed.  Hips and lower body in extension posture, upper trunk and left arm in flexed position.  Elbow and RHS on.  Sit pivot transfer w/c to bed with mod/max A.  Min A to maintain unsupported sitting with attempts to flex trunk to right.  Sit to supine max A.  Assisted with positioning in bed.  He is able to help with right arm and leg but tends to struggle against rigid left side.  Was able to tolerate chair position in bed for oral care and drinking from his blue cup with set up.  Elbow and RHS removed - skin intact.  Completed trunk, left LE and UE inhibition/facilitation.  He was able to inhibit left shoulder IR/pecs with trunk rotation in opposite direction.  Able to facilitate left triceps with strong contraction/ability to resist flexion and resulting relaxed bicep.  Parents present for 2nd half of session reviewed positioning and strategies to assist with control of left side.  Patient incontinent of urine - brief and shorts wet - max/dependent to doff wet clothing, complete hygiene and donn clean clothing.  He is able to roll left with min A, max/dep to roll right - reviewed strategies to get onto and stay on right side.  Patient remained in bed at close of session in chair position, 4 rails with padding up, alarm set, telesitter in place and parents present.    Therapy Documentation Precautions:  Precautions Precautions: Fall Precaution Comments: severe L hemi tone, R hemi dyskinesia (?), Pushing L,  aphasia Restrictions Weight Bearing Restrictions: No  Therapy/Group: Individual Therapy  Carlos Levering 05/04/2021, 7:48 AM

## 2021-05-04 NOTE — Progress Notes (Signed)
9                                                        PROGRESS NOTE   Subjective/Complaints:   Pt said back hurts- wants tylenol- asked nursing.   ROS: limited due to language/communication    Objective:   No results found.  Recent Labs    05/04/21 0641  WBC 6.5  HGB 16.2  HCT 48.4  PLT 333    Recent Labs    05/04/21 0641  NA 141  K 3.6  CL 101  CO2 29  GLUCOSE 87  BUN 20  CREATININE 1.24  CALCIUM 9.9     Intake/Output Summary (Last 24 hours) at 05/04/2021 1949 Last data filed at 05/04/2021 1746 Gross per 24 hour  Intake 720 ml  Output --  Net 720 ml        Physical Exam: Vital Signs Blood pressure 138/74, pulse 75, temperature 98.4 F (36.9 C), temperature source Oral, resp. rate 18, height 5\' 10"  (1.778 m), weight 79.6 kg, SpO2 94 %.    General: awake, alert, appropriate, constnat movement; NAD HENT: conjugate gaze; oropharynx moist CV: regular rate; no JVD Pulmonary: CTA B/L; no W/R/R- good air movement GI: soft, NT, ND, (+)BS Psychiatric: appropriate Neurological: constant movement- chorea? Vs pain?; able to ask/answer simple questions Extremities: No clubbing, cyanosis, or edema Skin: No evidence of breakdown, no evidence of rash  Neurologic:Motor 0/5 LUE, LLE 2-3/5--no change Tone  MAS 2 in Left biceps, 3 inwrist and finger flexors , MAS 2-3 Left knee ext tone , MAS 3 at Left ankle inverters--tone unchanged 6/17 Musculoskeletal: reduced Left elbow ext due to tone, wrist and fingers in flexed position on L side unchanged    Assessment/Plan: 1. Functional deficits which require 3+ hours per day of interdisciplinary therapy in a comprehensive inpatient rehab setting. Physiatrist is providing close team supervision and 24 hour management of active medical problems listed below. Physiatrist and rehab team continue to assess barriers to discharge/monitor patient progress toward functional and medical goals  Care Tool:  Bathing    Body  parts bathed by patient: Left arm, Chest, Abdomen, Front perineal area, Right upper leg, Left upper leg, Right lower leg, Face, Left lower leg   Body parts bathed by helper: Buttocks, Right arm Body parts n/a: Left lower leg, Right lower leg, Right arm, Left arm, Front perineal area, Buttocks   Bathing assist Assist Level: Minimal Assistance - Patient > 75% (sitting without standing)     Upper Body Dressing/Undressing Upper body dressing   What is the patient wearing?: Pull over shirt    Upper body assist Assist Level: Moderate Assistance - Patient 50 - 74%    Lower Body Dressing/Undressing Lower body dressing      What is the patient wearing?: Pants, Incontinence brief     Lower body assist Assist for lower body dressing: Maximal Assistance - Patient 25 - 49%     Toileting Toileting    Toileting assist Assist for toileting: Total Assistance - Patient < 25%     Transfers Chair/bed transfer  Transfers assist  Chair/bed transfer activity did not occur: Safety/medical concerns  Chair/bed transfer assist level: Dependent - mechanical lift     Locomotion Ambulation   Ambulation assist   Ambulation activity did not occur: Safety/medical concerns  Assist level: 2 helpers (+2 max A) Assistive device: Other (comment) (3 Musketeer) Max distance: 17ft   Walk 10 feet activity   Assist  Walk 10 feet activity did not occur: Safety/medical concerns        Walk 50 feet activity   Assist Walk 50 feet with 2 turns activity did not occur: Safety/medical concerns         Walk 150 feet activity   Assist Walk 150 feet activity did not occur: Safety/medical concerns         Walk 10 feet on uneven surface  activity   Assist Walk 10 feet on uneven surfaces activity did not occur: Safety/medical concerns         Wheelchair     Assist Will patient use wheelchair at discharge?: Yes Type of Wheelchair: Manual Wheelchair activity did not occur:  Safety/medical concerns         Wheelchair 50 feet with 2 turns activity    Assist    Wheelchair 50 feet with 2 turns activity did not occur: Safety/medical concerns       Wheelchair 150 feet activity     Assist  Wheelchair 150 feet activity did not occur: Safety/medical concerns       Blood pressure 138/74, pulse 75, temperature 98.4 F (36.9 C), temperature source Oral, resp. rate 18, height 5\' 10"  (1.778 m), weight 79.6 kg, SpO2 94 %.    Medical Problem List and Plan: 1.  L hemiplegia worsening due to recurrent R CR/BG infarct, has associated spasticity and contracture formation - spasticity has worsened significantly with new infarct , cognitive status declined          Continue CIR PT, OT, SLP, speech dysarthric hypophonic and delayed Appreciate neuro input          ELOS 6/25- goals at least mod A goals- family interested in extension--may be possible depending upon his progress--team to revisit early next week  6/20- Continue CIR- PT, OT and SLP 2.  Antithrombotics: -DVT/anticoagulation:  Pharmaceutical: Lovenox             -antiplatelet therapy: DAPT x3 weeks followed by ASA alone. 3. Pain Management: Continue Tylenol as needed  6/20- asking for tylenol for back pain- will con't regimen 4. Depression: LCSW to follow for evaluation and support. Increase Lexapro to 20mg , continue             -antipsychotic agents: N/AA 5. Neuropsych: This patient is not fully capable of making decisions on his own behalf. Neurostim with amantadine check with team in am , may increase dose 6. Skin/Wound Care: Routine pressure-relief measures. 7. Fluids/Electrolytes/Nutrition: Monitor intake/output.  nl BUN/Cr 8.  History of seizure disorder: Continue Lamictal twice daily 9.  Left spastic hemiparesis: increase baclofen 15 mg 4 times daily-  Had incobotulinum toxin A injection 300U on 04/22/21 to elbow flexors, wrist and finger flexors 6/19- spasticity improved at biceps more so  than at wrist and finger flexors now can range without pain 10.  History of anxiety disorder: Continue Lexapro.  Hydroxyzine 3 times daily. 11.  Insomnia: Continue melatonin at bedtime.Change trazodone to Seroquel as per neuro.  12.  Hypothyroid: Stable on supplement.  13. Leukocytosis without fever-resolved  14. Elevated LFTs: d/c Lipitor, f/u CMET essentially resolved , will resume at lower dose 20mg  15. AKI: resolved , drinking a little more   6/20- labs look great 6/20- con't to monitor 16.  Dysphagia- poor posture in bed may need to get in [facility] to eat-  no signs of aspiration  17.  Episode of urinary retention last week requiring cath will monitor with daily PVRs  -incontinent but voiding regularly   LOS: 20 days A FACE TO FACE EVALUATION WAS PERFORMED  Alexandera Kuntzman 05/04/2021, 7:49 PM

## 2021-05-05 NOTE — Progress Notes (Signed)
Occupational Therapy Session Note  Patient Details  Name: Howard White MRN: [id number] Date of Birth: 23-Jul-1961  Today's Date: 05/05/2021 OT Individual Time: 1011-1105 OT Individual Time Calculation (min): 54 min    Short Term Goals: Week 3:  OT Short Term Goal 1 (Week 3): Continue working on estabilished LTGs set at [employer].  Skilled Therapeutic Interventions/Progress Updates:     Session 1: (1011-1105) Pt in tilt in space wheelchair to start session.  He was transferred down to the dayroom and completed squat pivot transfer to the therapy mat with mod assist to the right side.  Max demonstrational cueing for sequencing to complete Bobath method of transfer.  Increased cervical and trunk flexion throughout the transfer.  Worked on upright sitting balance and posture EOM while incorporating RUE reaching task.  He needed max demonstrational cueing for maintaining head at midline with neutral cervical extension as well as maintaining thoracic extension and upright posture.  He continues to exhibit head tilt to the left with cervical flexion while reaching.  Increased tone still present in the left elbow, wrist, and digits.  Therapist completed gentle stretching to all areas for placement on therapist's leg.  Secondary to his motor impersistence and abnormal movement patterns, his tone increases when asked to sit up or complete any transitional movements.  Had him work on maintaining thoracic and cervical extension while trying to push a bedside table forward.  He would continue to flex his head and trunk.  Finished session with return to the wheelchair at squat pivot method to the right and return to the room.  Call button and phone in reach with safety belt in place.  Therapist educated nursing on application of both his resting hand splint and elbow extension splint.    Session 2: [phone])  Pt completed functional mobility from his wheelchair back to the bed to rest with total +2 (pt  20%).  He demonstrated increased pushing to the left in standing with increased trunk and cervical flexion with max facilitation needed to achieve any upright posture and maintain.  Pt with increased resistance to facilitation secondary to decreased spatial awareness.  He was able to advance his LLE but with increased scissoring and max facilitation for placement in preparation for weightbearing secondary to the adductor tone.  He was able to advance the RLE on his own but at times would try to step too fast before the LLE was ready for weightbearing or before he had shifted his weight.  Increased posterior lean through his hips noted throughout mobility.  Once he reached the bed, he was able to maintain sitting with min guard to min assist in order for therapist to help remove his LUE splints.  Sit to supine max assist where he was left with call button and phone in reach.  Encouraged him to lay his head back without a pillow so he could help stretch the anterior musculature of his neck.  Bed alarm and belt alarm in place.    Therapy Documentation Precautions:  Precautions Precautions: Fall Precaution Comments: severe L hemi tone, R hemi dyskinesia (?), Pushing L, aphasia Restrictions Weight Bearing Restrictions: No  Pain: Pain Assessment Pain Scale: Faces Pain Score: 0-No pain Faces Pain Scale: Hurts a little bit Pain Type: Acute pain Pain Location: Arm Pain Orientation: Left Pain Descriptors / Indicators: Aching Pain Onset: With Activity Pain Intervention(s): Repositioned ADL: See Care Tool Section for some details of mobility and selfcare   Therapy/Group: Individual Therapy  Hali Balgobin OTR/L 05/05/2021,  12:34 PM

## 2021-05-05 NOTE — Progress Notes (Signed)
Physical Therapy Session Note  Patient Details  Name: Howard White MRN: [id number] Date of Birth: 10-04-1961  Today's Date: 05/05/2021 PT Individual Time: 0093-8182 and 9937-1696 PT Individual Time Calculation (min): 32 min and 53 min  Short Term Goals: Week 3:  PT Short Term Goal 1 (Week 3): Pt will consistently perform supine<>sit with mod assist PT Short Term Goal 2 (Week 3): Pt will consistently perform bed<>chair transfers with mod assist of 1 PT Short Term Goal 3 (Week 3): Pt will perform sit<>stands with max assist of 1 PT Short Term Goal 4 (Week 3): Pt will ambulate at least 25ft using LRAD with +2 mod assist  Skilled Therapeutic Interventions/Progress Updates:    Session 1: Pt received in bed with breakfast in place and RN present. [name] arriving shortly after therapist. Pt agreeable to therapy session. Educated pt/mother on recommendation for custom wheelchair consultation to discuss pt's need for tilt-in-space (TIS) wheelchair with custom head support, back, and cushion to provide improved postural alignment due to significant L hemibody tone, flexed trunk and neck posturing, and to provide sufficient pressure relief. Educated pt's mother that TIS w/c is not able to be transported in a vehicle and that at this time pt is requiring ambulance transport home. Will discuss POC and ELOS during team conference tomorrow as pt's mother inquiring about an extension to allow pt to advance to a higher functional mobility level prior to D/C home. Supine>sitting R EOB, HOB slightly elevated and using bedrail, max assist of 1 for L LE positioning into hooklying due to extensor tone and mod assist for trunk upright with pt using R UE support on bedrail. Sitting EOB able to maintain balance using R UE support on bedrail and intermittently having posterior lean reclining on pillows supported against bedrail with min assist to come back upright. MD in/out for morning assessment. Sitting EOB with  assist as just described while donning shorts and shoes max assist. Sit>stand EOB>R UE support around +2 assist's shoulders with +2 mod assist for lifting and balance - pulled shorts up over hips total assist. L squat pivot EOB>w/c with mod assist of 1 to facilitate R anterior trunk lean and pivot hips. Pt left sitting in TIS w/c with seat belt alarm on, needs in reach, and telesitter in place - nursing staff notified of pt's need for full supervision to finish consuming breakfast.      Session 2: Pt received supine in bed, awake and agreeable to therapy session. Pt noted to be incontinent of bladder. Pt has increased L LE extensor tone this afternoon with impaired motor planning causing increased activation of quads when therapist attempting to flex LE and place in hooklying, requires heavy total assist to bend his knee. Rolling L using bedrails with supervision, rolling R with max assist after L LE placed in hooklying position during max assist LB clothing management and peri-care. Pt demos poorer focused and sustained attention to task with increased restlessness this afternoon. Supine>sitting R EOB, HOB flat, max assist for rolling onto R side then mod assist for B LE management off EOB and trunk upright. Pt demos more impaired trunk control this session with increased posterior lean in sitting. Sitting EOB donned shoes max assist.   L squat pivot EOB>TIS w/c with heavy mod assist of 1 and +2 close for safety - max manual facilitation for sustained R anterior trunk flexion/ weight shift during transfer with assist for pivoting hips, therapist blocking L LE extensor tone to keep foot planted firmly  on ground (preventing it from coming out from under him) - cuing for placing R UE abducted from side to decrease pushing and to increase R trunk lean.  Transported to/from gym in w/c for time management and energy conservation.  R squat pivot w/c>EOM with lighter mod assist of 1 - manual facilitation to sustain  L anterior trunk lean, which improves pt's ability to pivot his hips - pt able to transfer in this direction easier compared to towards L.  Dynamic sitting balance task EOM promoting R anterior lean with pt requiring min/mod assist of 1 to prevent L posterior LOB with pt having more impaired trunk control this session - attempted multiple different cuing and facilitation to improve trunk control with minimal to no improvement.  Transitioned into supine with mod assist for B LE management onto mat. In supine on flat mat without pillows focused on thoracic extension and cervical extension with retraction therapist providing manual facilitation for increased scapular retraction - pt requires dual-task of counting while lying head back on mat to sustain that position due to motor impersistence. Performed supine lower trunk rotations R/L for tone management with therapist maintaining LLE in hooklying position and then cuing for 3 diaphragmatic breaths while lower trunk was rotated. Returned to sitting EOM towards R with heavy mod assist of 1 for trunk upright.  L squat pivot EOM>w/c with mod assist - continued facilitation and cuing as above. Transported back to room. R squat pivot w/c>EOB with light mod assist - facilitation for anterior trunk lean as noted above. Sit>supine mod assist for B LE management and cuing for R lateral trunk lean onto side to prevent posterior trunk LOB. Pt left supported in bed with it set in "chair" position, bed seat belt alarm on, telesitter in place, bed alarm on, and NT arriving to assist with meal.   Therapy Documentation Precautions:  Precautions Precautions: Fall Precaution Comments: severe L hemi tone, R hemi dyskinesia (?), Pushing L, aphasia Restrictions Weight Bearing Restrictions: No   Pain: Session 1: No reports of pain throughout session.  Session 2: Reports L shoulder pain if therapist attempting to reposition it into even slight external rotation because  pt's tone causing pressure from fingers onto his chest.    Therapy/Group: Individual Therapy  Tawana Scale , PT, DPT, CSRS 05/05/2021, 7:58 AM

## 2021-05-05 NOTE — Progress Notes (Signed)
Patient ID: Howard White, male   DOB: [birth year], 59 y.o.   MRN: [id number]  [facility] currently not staffed for [place]. Patient referral sent to [facility]. Patient accepted for PT OT ST with [place] of 6/27  [place], [place]

## 2021-05-05 NOTE — Progress Notes (Signed)
Patient ID: Howard White, male   DOB: 24-Aug-1961, 60 y.o.   MRN: [id number]  Patient established with [facility].   Patient referral sent  Erlene Quan, [place]

## 2021-05-05 NOTE — Progress Notes (Signed)
Speech Language Pathology Daily Session Note  Patient Details  Name: Howard White MRN: [id number] Date of Birth: 11/30/1960  Today's Date: 05/05/2021 SLP Individual Time: 0832-0900 SLP Individual Time Calculation (min): 28 min  Short Term Goals: Week 3: SLP Short Term Goal 1 (Week 3): Patient will tolerate trials of Dys 3 solids textures with minA for clearing oral pocketing/residuals. SLP Short Term Goal 2 (Week 3): Patient will achieve adequate vocal intensity to produce 3-4 word phrases with modA cues. SLP Short Term Goal 3 (Week 3): Patient will demonstrate awareness to errors during functional tasks with mod-maxA cues. SLP Short Term Goal 4 (Week 3): Patient will demonstrated sustained attention to basic level task with minA to redirect.  Skilled Therapeutic Interventions:Skilled ST services focused on education. [name] was present for education, SLP provided education pertaining to current swallow strategies (left lingual sweep, liquid wash, following with puree and finger sweeps) and current speech/cognitive deficits in reduced vocal intensity, sustained attention and basic problem solving. SLP ordered MBS for tomorrow to assess tolerance of thin liquids, due to reported issued with larger boluses than 25ml (requiring provale cup to prevent overt s/s aspiration) and no h/o of MBS during CIR length of stay. SLP provided dys 2 texture hand out and all questions answered to satisfaction. Pt was left in room with mother, call bell within reach and chair alarm set. SLP recommends to continue skilled services.     Pain Pain Assessment Pain Score: 0-No pain  Therapy/Group: Individual Therapy  [name]  [facility] 05/05/2021, 7:54 AM

## 2021-05-05 NOTE — Progress Notes (Signed)
9                                                        PROGRESS NOTE   Subjective/Complaints:  Cont to require Max A for mob and ADL Mom would like to extend stay if possible   ROS: limited due to language/communication    Objective:   No results found.  Recent Labs    05/04/21 0641  WBC 6.5  HGB 16.2  HCT 48.4  PLT 333     Recent Labs    05/04/21 0641  NA 141  K 3.6  CL 101  CO2 29  GLUCOSE 87  BUN 20  CREATININE 1.24  CALCIUM 9.9      Intake/Output Summary (Last 24 hours) at 05/05/2021 8416 Last data filed at 05/04/2021 2151 Gross per 24 hour  Intake 660 ml  Output --  Net 660 ml         Physical Exam: Vital Signs Blood pressure 122/73, pulse (!) 55, temperature 97.8 F (36.6 C), temperature source Oral, resp. rate 16, height 5\' 10"  (1.778 m), weight 79.6 kg, SpO2 98 %.  General: No acute distress Mood and affect are appropriate Heart: Regular rate and rhythm no rubs murmurs or extra sounds Lungs: Clear to auscultation, breathing unlabored, no rales or wheezes Abdomen: Positive bowel sounds, soft nontender to palpation, nondistended Extremities: No clubbing, cyanosis, or edema Skin: No evidence of breakdown, no evidence of rash  Neurologic:Motor 0/5 LUE, LLE 2-3/5--no change Tone  MAS 2 in Left biceps, 3 inwrist and finger flexors , MAS 2-3 Left knee ext tone , MAS 3 at Left ankle inverters--tone unchanged  Musculoskeletal: reduced Left elbow ext due to tone, wrist and fingers in flexed position on L side unchanged    Assessment/Plan: 1. Functional deficits which require 3+ hours per day of interdisciplinary therapy in a comprehensive inpatient rehab setting. Physiatrist is providing close team supervision and 24 hour management of active medical problems listed below. Physiatrist and rehab team continue to assess barriers to discharge/monitor patient progress toward functional and medical goals  Care Tool:  Bathing    Body parts  bathed by patient: Left arm, Chest, Abdomen, Front perineal area, Right upper leg, Left upper leg, Right lower leg, Face, Left lower leg   Body parts bathed by helper: Buttocks, Right arm Body parts n/a: Left lower leg, Right lower leg, Right arm, Left arm, Front perineal area, Buttocks   Bathing assist Assist Level: Minimal Assistance - Patient > 75% (sitting without standing)     Upper Body Dressing/Undressing Upper body dressing   What is the patient wearing?: Pull over shirt    Upper body assist Assist Level: Moderate Assistance - Patient 50 - 74%    Lower Body Dressing/Undressing Lower body dressing      What is the patient wearing?: Pants, Incontinence brief     Lower body assist Assist for lower body dressing: Maximal Assistance - Patient 25 - 49%     Toileting Toileting    Toileting assist Assist for toileting: Total Assistance - Patient < 25%     Transfers Chair/bed transfer  Transfers assist  Chair/bed transfer activity did not occur: Safety/medical concerns  Chair/bed transfer assist level: Dependent - mechanical lift     Locomotion Ambulation   Ambulation assist   Ambulation  activity did not occur: Safety/medical concerns  Assist level: 2 helpers (+2 max A) Assistive device: Other (comment) (3 Musketeer) Max distance: 2ft   Walk 10 feet activity   Assist  Walk 10 feet activity did not occur: Safety/medical concerns        Walk 50 feet activity   Assist Walk 50 feet with 2 turns activity did not occur: Safety/medical concerns         Walk 150 feet activity   Assist Walk 150 feet activity did not occur: Safety/medical concerns         Walk 10 feet on uneven surface  activity   Assist Walk 10 feet on uneven surfaces activity did not occur: Safety/medical concerns         Wheelchair     Assist Will patient use wheelchair at discharge?: Yes Type of Wheelchair: Manual Wheelchair activity did not occur:  Safety/medical concerns         Wheelchair 50 feet with 2 turns activity    Assist    Wheelchair 50 feet with 2 turns activity did not occur: Safety/medical concerns       Wheelchair 150 feet activity     Assist  Wheelchair 150 feet activity did not occur: Safety/medical concerns       Blood pressure 122/73, pulse (!) 55, temperature 97.8 F (36.6 C), temperature source Oral, resp. rate 16, height 5\' 10"  (1.778 m), weight 79.6 kg, SpO2 98 %.    Medical Problem List and Plan: 1.  L hemiplegia worsening due to recurrent R CR/BG infarct, has associated spasticity and contracture formation - spasticity has worsened significantly with new infarct , cognitive status declined          Continue CIR PT, OT, SLP, speech dysarthric hypophonic and delayed Appreciate neuro input          ELOS 6/25- goals at least mod A goals- family interested in extension--may be possible depending upon his progress--team to revisit early next week Would rec custodial care at home , HTA may have benefits for this   6/20- Continue CIR- PT, OT and SLP 2.  Antithrombotics: -DVT/anticoagulation:  Pharmaceutical: Lovenox             -antiplatelet therapy: DAPT x3 weeks followed by ASA alone. 3. Pain Management: Continue Tylenol as needed  6/20- asking for tylenol for back pain- will con't regimen 4. Depression: LCSW to follow for evaluation and support. Increase Lexapro to 20mg , continue             -antipsychotic agents: N/AA 5. Neuropsych: This patient is not fully capable of making decisions on his own behalf. Neurostim with amantadine check with team in am , may increase dose 6. Skin/Wound Care: Routine pressure-relief measures. 7. Fluids/Electrolytes/Nutrition: Monitor intake/output.  nl BUN/Cr 8.  History of seizure disorder: Continue Lamictal twice daily 9.  Left spastic hemiparesis: increase baclofen 15 mg 4 times daily-  Had incobotulinum toxin A injection 300U on 04/22/21 to elbow flexors,  wrist and finger flexors 6/19- spasticity improved at biceps more so than at wrist and finger flexors now can range without pain 10.  History of anxiety disorder: Continue Lexapro.  Hydroxyzine 3 times daily. 11.  Insomnia: Continue melatonin at bedtime.Change trazodone to Seroquel as per neuro.  12.  Hypothyroid: Stable on supplement.  13. Leukocytosis without fever-resolved  14. Elevated LFTs: d/c Lipitor, f/u CMET essentially resolved , will resume at lower dose 20mg  15. AKI: resolved , drinking a little more   6/20-  labs look great 6/20- con't to monitor 16.  Dysphagia- poor posture in bed may need to get in [facility] to eat- no signs of aspiration  17.  Episode of urinary retention last week requiring cath will monitor with daily PVRs  -incontinent but voiding regularly   LOS: 21 days A FACE TO FACE EVALUATION WAS PERFORMED  Charlett Blake 05/05/2021, 8:12 AM

## 2021-05-06 ENCOUNTER — Inpatient Hospital Stay (HOSPITAL_COMMUNITY): Payer: PPO

## 2021-05-06 LAB — URINALYSIS, ROUTINE W REFLEX MICROSCOPIC
Bilirubin Urine: NEGATIVE
Glucose, UA: NEGATIVE mg/dL
Hgb urine dipstick: NEGATIVE
Ketones, ur: NEGATIVE mg/dL
Leukocytes,Ua: NEGATIVE
Nitrite: NEGATIVE
Protein, ur: NEGATIVE mg/dL
Specific Gravity, Urine: 1.023 (ref 1.005–1.030)
pH: 5 (ref 5.0–8.0)

## 2021-05-06 MED ORDER — BACLOFEN 5 MG HALF TABLET
5.0000 mg | ORAL_TABLET | Freq: Four times a day (QID) | ORAL | Status: DC
  Administered 2021-05-06 – 2021-05-09 (×12): 5 mg via ORAL
  Filled 2021-05-06 (×12): qty 1

## 2021-05-06 MED ORDER — DOCUSATE SODIUM 50 MG/5ML PO LIQD
200.0000 mg | Freq: Two times a day (BID) | ORAL | Status: DC
  Administered 2021-05-06 – 2021-05-09 (×7): 200 mg via ORAL
  Filled 2021-05-06 (×7): qty 20

## 2021-05-06 NOTE — Patient Care Conference (Signed)
Inpatient RehabilitationTeam Conference and Plan of Care Update Date: 05/06/2021   Time: 10:30 AM    Patient Name: Howard White      Medical Record Number: [id number]  Date of Birth: 1961/01/27 Sex: Male         Room/Bed: 4W17C/4W17C-01 Payor Info: Payor: [employer] / Plan: [employer] PPO / Product Type: *No Product type* /    Admit Date/Time:  04/14/2021  2:42 PM  Primary Diagnosis:  Acute stroke of basal ganglia [facility])  Hospital Problems: Principal Problem:   Acute stroke of basal ganglia [facility])    Expected Discharge Date: Expected Discharge Date: 05/09/21  Team Members Present: Physician leading conference: Dr. Alysia Penna Care Coodinator Present: Dorien Chihuahua, RN, BSN, CRRN;Christina Chipley, [place] Nurse Present: Dorien Chihuahua, RN PT Present: Page Spiro, PT OT Present: Clyda Greener, OT SLP Present: Other (comment) Gunnar Fusi, SLP) PPS Coordinator present : Gunnar Fusi, SLP     Current Status/Progress Goal Weekly Team Focus  Bowel/Bladder   Ptient is incontinent of B/B LBM  Patient will regain B&B continence  Continue time toileting, QS/PRN assessment   Swallow/Nutrition/ Hydration   dys 2 textures and thin liquids via provale cup, min-mod A - MBS 6/22  Min A  6/22 MBS   ADL's   Min assist for UB bathing with max assist for LB bathing and dressing.  Mod assist for UB dressing to donn a pullover shirt.  Increased LUE tone, tolerating splints currently.  Mod assist for squat pivot transfers with total assist for stand pivot.  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 and significant hypertonia in L UE  (flexor) and L LE (extensor) with poor awareness of midline and impaired motor planning - custom wheelchair consultation scheduled  min assist bed mobility, mod assist transfers, max assist gait  activity tolerance, bed mobility training, sitting balance/trunk control, transfer training, standing balance, L LE tone management and NMR, midline orientation awareness to decrease pushing, gait training, pt/family education   Communication   Mod A  Min-Mod A  speech intelligibility strategies   Safety/Cognition/ Behavioral Observations  mod-max A  Mod A  sustained attention, basic problem solving, recall and error awareness   Pain   Patient verbalize  Patient will remain pain free  Assess pain   Skin   Skin intact  Maintain patient's skin integrity  Reposition/turning and assess for incontinence     Discharge Planning:  Discharging back home with family to assist. [place] and [facility]   Team Discussion: Patient remains incontinent of bowel and bladder with note of increased restlessness and lethargy which affected overall functional status and little progress noted within the past week. MD checking UA and adjust Baclofen dose. Continue to note tone in Left UE which is a little better at the elbow. Staff applying splints if patient will keep on. Continue to note poor trunk control and neck flexion issues and poor attention with impulsivity, patient pushes with right LE which makes transfers difficult.Using hoyer for transfers and complete ADLs at bed level.   Patient on target to meet rehab goals: Currently min  - mod assist for upper body bathing and mod - max for upper body dressing. Max  -  total assist for lower body care. Able to complete squat pivot transfer with mod assist and the Bobath techniques.   *See Care Plan and progress notes for long and short-term goals.   Revisions to Treatment Plan:  Customized wheelchair consult MBS scheduled 05/06/21; currently on D2 diet with thin liquids  via provale cup.   Teaching Needs: Family education completed 05/06/21 with mom and hired caregiver  Current Barriers to Discharge: Decreased caregiver support, Home enviroment access/layout, Incontinence, and Behavior  Possible Resolutions to Barriers: Specialized DME Recommend hired caregiver to assist family with care     Medical Summary Current Status: Still incont of bowel and bladder pushing to Left side, impulsivity , tone LUE mildly  improved  Barriers to Discharge: Behavior;Medical stability  Barriers to Discharge Comments: fluctuating Possible Resolutions to [employer] Focus: cont splinting program, Mom hiring       I attest that I was present, lead the team conference, and concur with the assessment and plan of the team.   Margarito Liner 05/06/2021, 12:54 PM

## 2021-05-06 NOTE — Progress Notes (Signed)
Physical Therapy Weekly Progress Note  Howard White  Name: Howard White MRN: [id number] Date of Birth: 1961-11-14  Beginning of progress report period: April 29, 2021 End of progress report period: May 06, 2021  Today's Date: 05/06/2021 PT Individual Time: 1002-1036 and 3149-7026 PT Individual Time Calculation (min): 34 min  and 41 min and  Today's Date: 05/06/2021  PT Missed Time: 19 Minutes Missed Time Reason: Nursing care  Howard has met 2 of 4 short term goals.  Howard White continues to demonstrate slower than anticipated progress with therapy that is impacted by variability in Howard presentation due to pusher syndrome with impaired midline orientation, impaired motor planning with motor impersistence, impaired awareness, restlessness with impaired focused and sustained attention on tasks, and significant L UE and LE hypertonia. He continues to requires mod/max assist to roll R after manual facilitation to place L LE in flexed, hooklying position; is able to roll L using bedrails with supervision, and requires mod/max assist for supine<>sit via logroll technique to help with tone management. Sit<>stands varies from requiring +2 max assist to performing with mod assist of 1. Squat pivot transfers predominantly are at a skilled mod assist level; however, when pt presenting with increased pushing requires +2 assist for safety. He has advanced to gait training 2 trials during Howard stay ambulating most recently 91f with +2 mod assist. Pt is currently requiring a TIS wheelchair to provide trunk/cervical support for improved spinal alignment as well as tilt feature to ensure pt safety and for pressure relief - planning for custom wheelchair consultation tomorrow. Pt will benefit from continued CIR level therapies and family education/training has been initiated in preparation for upcoming D/C.  Howard continues to demonstrate the following deficits muscle weakness, muscle joint tightness, and muscle  paralysis, decreased cardiorespiratoy endurance, impaired timing and sequencing, abnormal tone, unbalanced muscle activation, motor apraxia, decreased coordination, and decreased motor planning,  , decreased midline orientation, decreased attention, decreased awareness, decreased problem solving, decreased safety awareness, and delayed processing, and decreased sitting balance, decreased standing balance, decreased postural control, hemiplegia, and decreased balance strategies and therefore will continue to benefit from skilled PT intervention to increase functional independence with mobility.  Howard not progressing toward long term goals.  See goal revision. Required significant modifications of LTGs due to slower than anticipated progress and continued need for increased assistance due to severe L UE and LE hypertonia, pusher syndrome with impaired midline orientation, impaired motor planning with motor impersistence, and impaired awareness limiting progress. Continue plan of care.  PT Short Term Goals Week 3:  PT Short Term Goal 1 (Week 3): Pt will consistently perform supine<>sit with mod assist PT Short Term Goal 1 - Progress (Week 3): Progressing toward goal (still with inconsistencies) PT Short Term Goal 2 (Week 3): Pt will consistently perform bed<>chair transfers with mod assist of 1 PT Short Term Goal 2 - Progress (Week 3): Progressing toward goal (still with inconsistencies) PT Short Term Goal 3 (Week 3): Pt will perform sit<>stands with max assist of 1 PT Short Term Goal 3 - Progress (Week 3): Met PT Short Term Goal 4 (Week 3): Pt will ambulate at least 3105fusing LRAD with +2 mod assist PT Short Term Goal 4 - Progress (Week 3): Met Week 4:  PT Short Term Goal 1 (Week 4): = to LTGs based on ELOS  Skilled Therapeutic Interventions/Progress Updates:  Ambulation/gait training;Discharge planning;Functional mobility training;Psychosocial support;Therapeutic Activities;Visual/perceptual  remediation/compensation;Balance/vestibular training;Disease management/prevention;Neuromuscular re-education;Skin care/wound management;Therapeutic Exercise;Wheelchair propulsion/positioning;Cognitive remediation/compensation;DME/adaptive equipment instruction;Pain management;Splinting/orthotics;UE/LE Strength  taining/ROM;Community reintegration;Functional electrical stimulation;Howard/family education;Stair training;UE/LE Coordination activities   Session 1: Pt received supine in bed with Howard White, Howard White, and hired caregiver, Howard White, present for education and hands-on training. Pt agreeable to therapy session. Therapist provided verbal education and visual demonstration of the following:  - need for a hospital bed that elevates to decrease risk of caregiver injury while assisting Howard with bed level bathing and dressing  - management of LE tone via providing passive movement into hip flexion followed by overpressure into knee flexion - importance of L LE ankle DF ROM stretching and use of dynamic night splint, demonstrated how to don/doff - donning LB clothing in supine with manual facilitation for L LE positioning into hooklying position to allow pt to bridge and increase pt participation in pulling pants up over hips - need to perform hoyer lift transfers for safety at home due to variable presentations with squat pivot transfers intermittently requiring +2 assist and when performing with mod assist of 1 requiring very skilled interventions to complete them safely - Pt's White reports hoyer lift has already been delivered to her home - plan for custom wheelchair consultation tomorrow with pt's mom planning to attend if possible  Performed supine>sitting R EOB via logroll technique to manage tone, therapist facilitating L LE into hooklying position, and providing mod assist for trunk upright. Sitting EOB pt continues to demo posterior trunk lean - educated family on pt requiring constant  assistance to maintain sitting balance and only to perform this with +2 assist if needing pt to sit EOB. Returned to R sidelying with mod assist. Pt quickly falling asleep in this position and remains asleep during hoyer lift transfer - notified MD during conference of pt's increased lethargy this AM.  Educated on how to don hoyer lift U sling via rolling in bed and educated on proper positioning of sling. Demonstrated manual hoyer lift transfer bed>TIS w/c educating on proper set-up and positioning of TIS w/c along with need for +2 assist to safely manage the lift and assist with pt positioning while lowering into the w/c (using strap on sling to move pt into sitting position while being lowered into TIS w/c) - had hired caregiver assist with this task. Planning for additional hands-on education/training tomorrow and Friday. Pt left tilted back in TIS w/c with seat belt on and family present.    Session 2: Pt received supine in bed with NT present to perform I&O cath to obtain urine sample for lab - of note, pt incontinent of bladder but upon bladder scan has 136m residual urine. Missed 19 minutes of skilled physical therapy. Nursing staff notified therapist once complete and pt agreeable to therapy session. Supine in bed donned tennis shoes max assist. Supine>sitting R EOB, HOB flat, via logroll technique to manage L LE tone (requires total assist to flex L LE into hooklying position) with max assist for rolling and heavy mod assist to keep trunk forward while coming upright. L squat pivot EOB>TIS w/c with mod assist of 1 for pivoting hips and keeping trunk flexed forward to R for head/hips relationship, therapist blocking L LE extensor tone to keep foot on ground.  Transported to/from gym in w/c for time management and energy conservation. R squat pivot TIS w/c>EOM with light mod assist to maintain trunk flexed forward and pivot hips while blocking L LE extensor tone from causing pt's foot to shoot out  from underneath him.  Sitting on EOM repeated same task from yesterday to assess pt progress  and fluctuations in presentations - performed R UE, R lateral reaching to grasp clothespins and then place on basketball goal in midline to promote trunk/cervical extension - requires intermittent min assist for trunk control to prevent L posterior LOB, more frequent towards end when pt becoming fatigued - pt demos much improved ability to reach and  maintain upright without LOB compared to yesterday.   Progressed to similar task in standing. Pt able to come to stand initially with +2 mod assist for lifting to stand and balance, due to R LE pushing causing L lean, progressed to performing sit<>stands 3x with only mod assist of 1 for balance and lifting. Had pt perform seated forward trunk leans to reach down and touch Howard R shoe 5x repeatedly to decrease trunk extension/posterior lean bias. In standing, pt reaches to grasp item in midline and then leans towards R to place it on target, focusing on R weight shift to decrease pushing - requires min/mod assist of 1 for balance during this task (noted to use backs of legs on mat intermittently for balance/stability) but overall decreased posterior lean in standing compared to prior sessions.  Gait training 65f using R UE support around +2 assist's shoulders with +2 mod assist for balance - pt able to advance L LE during swing initially requiring mod assist progressed to min assist to prevent excessive hip adduction and scissoring, continues to have significant L LE extensor tone with no true knee flexion during swing and only partial hip flexion - with cuing and facilitation able to advance to reciprocal gait pattern (improved L swing associated with step-through pattern on R LE) - has L anterior trunk flexion throughout that pt able to slightly improve towards end of walk with increased awareness of it.  Transported back to room and pt reporting need to have BM. R  squat pivot TIS w/c>EOB with mod assist of 1 as above. Sit>R sidelying mod assist for B LE management - have pt transition into R sidelying as opposed to going directly into supine to prevent posterior trunk LOB. Doffed pants and placed bed pad total assist - NT notified and present to assume care of Howard.  Therapy Documentation Precautions:  Precautions Precautions: Fall Precaution Comments: severe L hemi tone, R hemi dyskinesia (?), Pushing L, aphasia Restrictions Weight Bearing Restrictions: (P) No   Pain:  Session 1: No reports of pain throughout session.  Session 2: No reports of pain during session.   Therapy/Group: Individual Therapy  CTawana Scale, PT, DPT, CSRS 05/06/2021, 7:55 AM

## 2021-05-06 NOTE — Progress Notes (Signed)
Occupational Therapy Session Note  Patient Details  Name: Howard White MRN: [id number] Date of Birth: April 26, 1961  Today's Date: 05/06/2021 OT Individual Time: 0017-4944 OT Individual Time Calculation (min): 59 min    Short Term Goals: Week 3:  OT Short Term Goal 1 (Week 3): Continue working on estabilished LTGs set at [employer].  Skilled Therapeutic Interventions/Progress Updates:    [name] and hired caregiver in for education this session.  Educated both on wearing schedule for his resting hand splint and elbow extension splint.  Also provided education and handout on PROM stretching for the left elbow, wrist, and digits.  Had caregiver assist with donning both splints with mod instructional cueing.  Pt asleep throughout session resting in the chair, so he exhibited less tone than usual, making this easier.  [name] was not able to practice, but observed and is familiar.  Told her she would be able to practice tomorrow and Friday as needed.  Will continue with family education tomorrow.  Pt left resting in the wheelchair with the call button and phone in reach and safety belt in place.    Therapy Documentation Precautions:  Precautions Precautions: Fall Precaution Comments: severe L hemi tone, R hemi dyskinesia (?), Pushing L, aphasia Restrictions Weight Bearing Restrictions: No  Pain: Pain Assessment Pain Scale: Faces Pain Score: 0-No pain ADL: See Care Tool Section for some details of mobility and selfcare   Therapy/Group: Individual Therapy  Shreyan Hinz OTR/L 05/06/2021, 12:47 PM

## 2021-05-06 NOTE — Progress Notes (Signed)
Attempts to place on Bledsoe brace unsuccessful, Administrator, sports ([facility])

## 2021-05-06 NOTE — Plan of Care (Signed)
  Problem: RH Car Transfers Goal: LTG Patient will perform car transfers with assist (PT) Description: LTG: Patient will perform car transfers with assistance (PT). Outcome: Not Applicable Flowsheets (Taken 05/06/2021 1848) LTG: Pt will perform car transfers with assist:: (Discontinued as pt will require ambulance transport home, not able to safely advance to car transfer training) -- Note: Discontinued as pt will require ambulance transport home, not able to safely advance to car transfer training   Problem: RH Ambulation Goal: LTG Patient will ambulate in home environment (PT) Description: LTG: Patient will ambulate in home environment, # of feet with assistance (PT). Outcome: Not Applicable Flowsheets (Taken 05/06/2021 1848) LTG: Pt will ambulate in home environ  assist needed:: (Discontinued as pt will not be a safe household level functional ambulator) --   Problem: RH Wheelchair Mobility Goal: LTG Patient will propel w/c in controlled environment (PT) Description: LTG: Patient will propel wheelchair in controlled environment, # of feet with assist (PT) Outcome: Not Applicable Flowsheets (Taken 05/06/2021 1848) LTG: Pt will propel w/c in controlled environ  assist needed:: (discontiued as pt will require a TIS wheelchair that requires dependent transport) -- Note: discontiued as pt will require a TIS wheelchair that requires dependent transport Goal: LTG Patient will propel w/c in home environment (PT) Description: LTG: Patient will propel wheelchair in home environment, # of feet with assistance (PT). Outcome: Not Applicable Flowsheets (Taken 05/06/2021 1848) LTG: Pt will propel w/c in home environ  assist needed:: (discontiued as pt will require a TIS wheelchair that requires dependent transport) -- Note: discontiued as pt will require a TIS wheelchair that requires dependent transport   Problem: RH Pre-functional/Other (Specify) Goal: RH LTG PT (Specify) 1 Description:  RH LTG PT  (Specify) 1 Flowsheets (Taken 05/06/2021 1853) LTG: Other PT (Specify) 1: Pt's family will demonstrate understanding of how to perform dependent hoyer lift transfers bed<>wheelchair   Problem: RH Pre-functional/Other (Specify) Goal: RH LTG PT (Specify) 2 Description: RH LTG PT (Specify) 2 Flowsheets (Taken 05/06/2021 1853) LTG: Other PT (Specify) 2: Pt will be able to state his pressure relieving schedule with min cuing to promote self-advocacy of his care

## 2021-05-06 NOTE — Progress Notes (Signed)
Modified Barium Swallow Progress Note  Patient Details  Name: Howard White MRN: [id number] Date of Birth: May 28, 1961  Today's Date: 05/06/2021  Modified Barium Swallow completed.  Full report located under Chart Review in the Imaging Section.  Brief recommendations include the following:  Clinical Impression   Pt presents with mild oropharyngeal dysphagia characterized by reduced bolus cohesion and delay in AP transport with advanced textures (dys 2 and dys 3) as well as delay in swallow initiation impacting safe consumption of larger quantities of thin liquids, further impacted by pt's natural anterior positioning.  Pt initiates the swallow at the valleculae for solids and at the pyriform sinuses for thin liquids. Due to pt's natural anterior positioning (head tilted forward, in an almost chin tuck positioning) along with the delay in swallow initiation resulted in consistent trace-mild penetration (PAS 2 and PAS 3) with thin liquids in quantities of 10 ml and with straw sips pt demonstrated frank sensed aspiration (PAS 7) during the swallow in which cough was ineffective in clearing all aspirate. Pt was able to protect his airway with controlled smaller sip of thin liquids of 74ml with only one occurrence of trace cleared penetration (PAS 2) during the swallow. Pt demonstrates appropriate pharyngeal function when consuming dys 1 textures, dys 2 textures, dys 3 textures and mixed consistency trials of 59ml of thin liquids. Pt demonstrated prolonged mastication, reduced bolus cohesion and delay in AP transport with dys 2 and dys 3 textures, however demonstrates increase control with dys 2 textures. Pt chewed barium tablet presented in puree textures and due to cognitive deficits pt was unable to maintain neutral head positioning for functional amounts of time. SLP recommends a diet of dys 2 textures and thin liquids via 8ml provable cup, full supervision A to ensure oral clearance, lingual sweeps,  alternating liquids and solids and oral care after meals. SLP can assess larger quantities of thin liquids and advanced textures at bedside as cognition improves, demonstrating ability to maintain neutral head poisoning and participation in pharyngeal exercise to increase timing of swallow initiation.    Swallow Evaluation Recommendations       SLP Diet Recommendations: Dysphagia 2 (Fine chop) solids;Thin liquid   Liquid Administration via: Other (Comment);Cup (33ml provale cup)   Medication Administration: Crushed with puree   Supervision: Patient able to self feed;Full supervision/cueing for compensatory strategies   Compensations: Minimize environmental distractions;Slow rate;Small sips/bites;Lingual sweep for clearance of pocketing;Monitor for anterior loss;Follow solids with liquid   Postural Changes: Seated upright at 90 degrees   Oral Care Recommendations: Oral care BID;Other (Comment) (oral care after meals)        Jelicia Nantz  [facility] 05/06/2021,1:24 PM

## 2021-05-06 NOTE — Progress Notes (Signed)
9                                                        PROGRESS NOTE   Subjective/Complaints: ALert fidgeting in chair PT notes some increased somnolence MBS today , ok for D2 thin liq small sips  ROS: limited due to language/communication    Objective:   No results found.  Recent Labs    05/04/21 0641  WBC 6.5  HGB 16.2  HCT 48.4  PLT 333     Recent Labs    05/04/21 0641  NA 141  K 3.6  CL 101  CO2 29  GLUCOSE 87  BUN 20  CREATININE 1.24  CALCIUM 9.9      Intake/Output Summary (Last 24 hours) at 05/06/2021 1002 Last data filed at 05/05/2021 1821 Gross per 24 hour  Intake 600 ml  Output --  Net 600 ml         Physical Exam: Vital Signs Blood pressure 104/67, pulse (!) 51, temperature (!) 97.5 F (36.4 C), temperature source Oral, resp. rate 18, height 5\' 10"  (1.778 m), weight 79.6 kg, SpO2 94 %.  General: No acute distress Mood and affect are appropriate Heart: Regular rate and rhythm no rubs murmurs or extra sounds Lungs: Clear to auscultation, breathing unlabored, no rales or wheezes Abdomen: Positive bowel sounds, soft nontender to palpation, nondistended Extremities: No clubbing, cyanosis, or edema Skin: No evidence of breakdown, no evidence of rash  Neurologic:Motor 0/5 LUE, LLE 2-3/5--no change Tone  MAS 2 in Left biceps, 3 inwrist and finger flexors , MAS 2-3 Left knee ext tone , MAS 3 at Left ankle inverters--tone unchanged  Musculoskeletal: reduced Left elbow ext due to tone, wrist and fingers in flexed position on L side unchanged    Assessment/Plan: 1. Functional deficits which require 3+ hours per day of interdisciplinary therapy in a comprehensive inpatient rehab setting. Physiatrist is providing close team supervision and 24 hour management of active medical problems listed below. Physiatrist and rehab team continue to assess barriers to discharge/monitor patient progress toward functional and medical goals  Care  Tool:  Bathing    Body parts bathed by patient: Left arm, Chest, Abdomen, Front perineal area, Right upper leg, Left upper leg, Right lower leg, Face, Left lower leg   Body parts bathed by helper: Buttocks, Right arm Body parts n/a: Left lower leg, Right lower leg, Right arm, Left arm, Front perineal area, Buttocks   Bathing assist Assist Level: Minimal Assistance - Patient > 75% (sitting without standing)     Upper Body Dressing/Undressing Upper body dressing   What is the patient wearing?: Pull over shirt    Upper body assist Assist Level: Moderate Assistance - Patient 50 - 74%    Lower Body Dressing/Undressing Lower body dressing      What is the patient wearing?: Pants, Incontinence brief     Lower body assist Assist for lower body dressing: Maximal Assistance - Patient 25 - 49%     Toileting Toileting    Toileting assist Assist for toileting: Total Assistance - Patient < 25%     Transfers Chair/bed transfer  Transfers assist  Chair/bed transfer activity did not occur: Safety/medical concerns  Chair/bed transfer assist level: Moderate Assistance - Patient 50 - 74% (squat pivot)     Locomotion Ambulation  Ambulation assist   Ambulation activity did not occur: Safety/medical concerns  Assist level: 2 helpers Assistive device: No Device Max distance: 8-10'   Walk 10 feet activity   Assist  Walk 10 feet activity did not occur: Safety/medical concerns        Walk 50 feet activity   Assist Walk 50 feet with 2 turns activity did not occur: Safety/medical concerns         Walk 150 feet activity   Assist Walk 150 feet activity did not occur: Safety/medical concerns         Walk 10 feet on uneven surface  activity   Assist Walk 10 feet on uneven surfaces activity did not occur: Safety/medical concerns         Wheelchair     Assist Will patient use wheelchair at discharge?: Yes Type of Wheelchair: Manual Wheelchair activity  did not occur: Safety/medical concerns         Wheelchair 50 feet with 2 turns activity    Assist    Wheelchair 50 feet with 2 turns activity did not occur: Safety/medical concerns       Wheelchair 150 feet activity     Assist  Wheelchair 150 feet activity did not occur: Safety/medical concerns       Blood pressure 104/67, pulse (!) 51, temperature (!) 97.5 F (36.4 C), temperature source Oral, resp. rate 18, height 5\' 10"  (1.778 m), weight 79.6 kg, SpO2 94 %.    Medical Problem List and Plan: 1.  L hemiplegia worsening due to recurrent R CR/BG infarct, has associated spasticity and contracture formation - spasticity has worsened significantly with new infarct , cognitive status declined          Continue CIR PT, OT, SLP, speech dysarthric hypophonic and delayed Appreciate neuro input          ELOS 6/25- goals at least mod A goals- Family training in progress Would rec custodial care at home , HTA has benefits for this as discussed with Dr Amalia Hailey  6/20- Continue CIR- PT, OT and SLP 2.  Antithrombotics: -DVT/anticoagulation:  Pharmaceutical: Lovenox             -antiplatelet therapy: DAPT x3 weeks followed by ASA alone. 3. Pain Management: Continue Tylenol as needed  6/20- asking for tylenol for back pain- will con't regimen 4. Depression: LCSW to follow for evaluation and support. Increase Lexapro to 20mg , continue             -antipsychotic agents: N/AA 5. Neuropsych: This patient is not fully capable of making decisions on his own behalf. Neurostim with amantadine check with team in am , may increase dose 6. Skin/Wound Care: Routine pressure-relief measures. 7. Fluids/Electrolytes/Nutrition: Monitor intake/output.  nl BUN/Cr 8.  History of seizure disorder: Continue Lamictal twice daily 9.  Left spastic hemiparesis: decrease baclofen to 5 mg 4 times daily- may be contributing to drowsiness Had incobotulinum toxin A injection 300U on 04/22/21 to elbow flexors, wrist  and finger flexors 6/19- spasticity improved at biceps more so than at wrist and finger flexors now can range without pain 10.  History of anxiety disorder: Continue Lexapro.  Hydroxyzine 3 times daily. 11.  Insomnia: Continue melatonin at bedtime.Change trazodone to Seroquel as per neuro.  12.  Hypothyroid: Stable on supplement.  13. Leukocytosis without fever-resolved  14. Elevated LFTs: d/c Lipitor, f/u CMET essentially resolved , will resume at lower dose 20mg  15. AKI: resolved , drinking a little more   6/20- labs look  great 6/20- con't to monitor 16.  Dysphagia- poor posture in bed may need to get in [facility] to eat- no signs of aspiration  17.  Episode of urinary retention last week requiring cath will monitor with daily PVRs  -incontinent but voiding regularly, PT notes some somnolence given incont with occ retention check UA   LOS: 22 days A FACE TO FACE EVALUATION WAS PERFORMED  Charlett Blake 05/06/2021, 10:02 AM

## 2021-05-06 NOTE — Progress Notes (Signed)
Patient ID: Howard White, male   DOB: 1961-07-09, 60 y.o.   MRN: [id number]  Team Conference Report to Patient/Family  Team Conference discussion was reviewed with the patient and caregiver, including goals, any changes in plan of care and target discharge date.  Patient and caregiver express understanding and are in agreement.  The patient has a target discharge date of 05/09/21.  Sw met with patient. Called pt mother provided conference updates. No question or concerns. Confirmed PTAR would be scheduled Saturday 9-11  [name] 05/06/2021, 1:17 PM

## 2021-05-07 ENCOUNTER — Inpatient Hospital Stay (HOSPITAL_COMMUNITY): Payer: PPO

## 2021-05-07 LAB — URINE CULTURE: Culture: NO GROWTH

## 2021-05-07 NOTE — Progress Notes (Signed)
Occupational Therapy Session Note  Patient Details  Name: [name] MRN: [id number] Date of Birth: [birth year]  Today's Date: 05/07/2021 OT Individual Time: 1100-1200 OT Individual Time Calculation (min): 60 min    Short Term Goals: Week 3:  OT Short Term Goal 1 (Week 3): Continue working on estabilished LTGs set at [employer].  Skilled Therapeutic Interventions/Progress Updates:    Pt sleeping in bed upon arrival. Pt would arouse for short periods of time throughout session but unable to keep eyes open. Mother and Father present throughout session. Employees (2) of local health care agency arrived during session. Focus on review of procedures for donning/doffing Lt resting hand splint and Lt elbow brace. Reviewed PROM for LUE/hand. [name] has handouts for PROM. Pt's father return demonstrated donning/doffing resting hand splint and elbow brace. Pt remained asleep throughout session. Pt remained in bed with bed alarm activated and posey belt secured. Parents at bedside.  Therapy Documentation Precautions:  Precautions Precautions: Fall Precaution Comments: severe L hemi tone, R hemi dyskinesia (?), Pushing L, aphasia Restrictions Weight Bearing Restrictions: No Pain: Pain Assessment Pain Scale: Faces Pain Score: 0-No pain   Therapy/Group: Individual Therapy  Leroy Libman 05/07/2021, 12:13 PM

## 2021-05-07 NOTE — Progress Notes (Signed)
Occupational Therapy Session Note  Patient Details  Name: Howard White MRN: [id number] Date of Birth: 08-28-1961  Today's Date: 05/07/2021 OT Individual Time: 1345-1410 OT Individual Time Calculation (min): 25 min    Short Term Goals: Week 2:  OT Short Term Goal 1 (Week 2): Pt will complete rolling in the bed with max assist to the right and mod assist to the left to assist with toileting tasks and LB clothing management. OT Short Term Goal 1 - Progress (Week 2): Met OT Short Term Goal 2 (Week 2): Pt will tolerate hand splint for positioning of the LUE into slight extension. OT Short Term Goal 2 - Progress (Week 2): Met OT Short Term Goal 3 (Week 2): Pt will complete LB dressing sit to stand with max assist. OT Short Term Goal 3 - Progress (Week 2): Met OT Short Term Goal 4 (Week 2): Pt will complete UB dressing with mod assist. OT Short Term Goal 4 - Progress (Week 2): Not met Week 3:  OT Short Term Goal 1 (Week 3): Continue working on estabilished LTGs set at [employer].    Skilled Therapeutic Interventions/Progress Updates:    Pt greeted at time of session supine in bed sleeping, no s/s of pain throughout session but did have some muscle guarding/increase in tone with ROM. Pt's splint noted to be on LUE but digits had retracted into flexion and not resting on splint. Given short session, focus of session on PROM for decreasing contracture strength in LUE. PROM performed supine for the following: shoulder abduction, elbow extension (able to achive approx -40* visually), wrist extension (neutral), and full digit extension. Note extended amount of time needed for PROM and static hold to obtain ROM. Pt left in bed resting alarms on and intact with LUE splint off per schedule (remove around 3) and pillow providing shoulder abduction, slight elbow extension and digits extended on pillow.   Therapy Documentation Precautions:  Precautions Precautions: Fall Precaution Comments: severe  L hemi tone, R hemi dyskinesia (?), Pushing L, aphasia Restrictions Weight Bearing Restrictions: No     Therapy/Group: Individual Therapy  Viona Gilmore 05/07/2021, 7:25 AM

## 2021-05-07 NOTE — Progress Notes (Signed)
Alert an cooperative,drink very well from [employer] cup with monitoring., . States he want to go outside and get some of that good Vitamin D sun today.Tele monitoring remains in place,

## 2021-05-07 NOTE — Progress Notes (Signed)
Ortho  technician on department to f/u with attempting to place on Bledsoe brace but unsuccessful, will notify supervision related to concerns

## 2021-05-07 NOTE — Progress Notes (Signed)
Physical Therapy Session Note  Patient Details  Name: Howard White MRN: [id number] Date of Birth: Feb 04, 1961  Today's Date: 05/07/2021 PT Individual Time: 0805-0913 PT Individual Time Calculation (min): 68 min   Short Term Goals: Week 4:  PT Short Term Goal 1 (Week 4): = to LTGs based on ELOS  Skilled Therapeutic Interventions/Progress Updates:    Pt received supine in bed with his mother, Howard White, present and pt agreeable to therapy session. Brandon, ATP present from [employer] for wheelchair consultation and to discuss pt's current wheelchair needs at [place] and pt's progress with difficulty anticipating patient's wheelchair needs with continued progression.   In supine, donned shorts, socks, and shoes max assist - pt continues to demo significant L LE extensor tone requiring total assist to flex hip/knees and place in hooklying, then with mod assist for L LE management pt able to lifts hips in bridge and pull pants over hips with min assist. Supine>sitting R EOB, via logroll technique to help with tone management, with max assist for rolling (keeping L LE flexed) and mod assist for trunk upright - cuing to maintain anterior trunk lean/weight shift while coming upright.   Sitting EOB, pt leaning backwards onto pillow supported against bedrail during total assist to thread shirt on L UE due to significant flexor tone and max assist to pull over head.  L squat pivot EOB>TIS w/c with mod assist of 1 - requires manual facilitation to maintain R anterior trunk lean throughout transfer (cuing for R UE placement to decrease pushing) and to pivot hips while blocking L LE extensor tone to prevent his foot from sliding out from under him.  MD in/out for morning assessment.   Therapist, pt, pt's mother, and ATP discussed custom wheelchair in regards to the following:  - at pt's CLOF he requires a TIS wheelchair to ensure pt's safety in the chair and to allow for pressure relief - LTGs of pt being able to  perform hemi-propulsion technique; however, that not being feasible in a TIS wheelchair unless selecting a hybrid TIS with large wheels to allow self-propulsion  - pt's caregiver limitations in not being able to lift >35lbs, which rules out ability to manage the hybrid wheelchair because if pt progresses to being able to complete car transfers then pt's family could not load the hybrid wheelchair in/out of the vehicle; as well as the weight and height of w/c not allowing efficient w/c propulsion by patient  Pt/family, therapist, and ATP recommended patient receive a loaner TIS wheelchair from [employer] at this time and then after ~100months once patient has reached a higher functional mobility level then re-evaluate wheelchair needs and order a custom chair at that time - pt and pt's mom in agreement with this plan.   Discussed loaner wheelchair requirements of the following:  - 18x18 size - contoured back with L lateral trunk support - hip support pads to decrease R pelvic weight shift (which causes worsening L lateral trunk lean) - chest strap to decrease excessive thoracic kyphosis - head support  - leg rests with foot straps to improve LE alignment and assist with tone management - pressure relieving cushion that still allows sufficient pelvic alignment support    Reinforced education with pt's mother on how to perform hoyer lift transfers and had her provide hands-on assistance to perform hoyer transfer TIS w/c to bed. Reinforced the following:  - proper set-up of wheelchair and bed in preparation for transfer - how to don sling in the wheelchair with 2  person assist for trunk control/sitting balance safety - how to attach sling to hoyer lift, crossing it at the legs for pt safety - locking hoyer lift brakes - need for +2 assist when transporting pt in hoyer lift to the bed - how to open/close hoyer lift legs to allow it to fit around wheelchair - how to raise/lower patient in the hoyer  At  end of session pt left supine in bed with needs in reach, bed alarm on, and pt's mother present.   Therapy Documentation Precautions:  Precautions Precautions: Fall Precaution Comments: severe L hemi tone, R hemi dyskinesia (?), Pushing L, aphasia Restrictions Weight Bearing Restrictions: No   Pain:  Reports some discomfort in L UE with significant flexor hypertonia - no ROM performed at this time due to focus of session on wheelchair consultation and family training on transfers.    Therapy/Group: Individual Therapy  Tawana Scale , PT, DPT, CSRS 05/07/2021, 7:58 AM

## 2021-05-07 NOTE — Plan of Care (Signed)
Patient is incontinent and will pull off brief after soiling but unable to catch prior to soiling

## 2021-05-07 NOTE — Progress Notes (Signed)
9                                                        PROGRESS NOTE   Subjective/Complaints: Fluctuating level of alertness noted by pt's mother and staff.  Discussed normal UA result   ROS: limited due to language/communication    Objective:   No results found.  No results for input(s): WBC, HGB, HCT, PLT in the last 72 hours.   No results for input(s): NA, K, CL, CO2, GLUCOSE, BUN, CREATININE, CALCIUM in the last 72 hours.    Intake/Output Summary (Last 24 hours) at 05/07/2021 0834 Last data filed at 05/07/2021 0700 Gross per 24 hour  Intake 300 ml  Output 1417 ml  Net -1117 ml         Physical Exam: Vital Signs Blood pressure 118/69, pulse 76, temperature 97.8 F (36.6 C), resp. rate 18, height 5\' 10"  (1.778 m), weight 79.6 kg, SpO2 93 %.  General: No acute distress Mood and affect are appropriate Heart: Regular rate and rhythm no rubs murmurs or extra sounds Lungs: Clear to auscultation, breathing unlabored, no rales or wheezes Abdomen: Positive bowel sounds, soft nontender to palpation, nondistended Extremities: No clubbing, cyanosis, or edema Skin: No evidence of breakdown, no evidence of rash  Neurologic:Motor 0/5 LUE, LLE 2-3/5--no change Tone  MAS 3 in Left biceps, 3 inwrist and finger flexors , MAS 2-3 Left knee ext tone , MAS 3 at Left ankle inverters--tone unchanged  Musculoskeletal: reduced Left elbow ext due to tone, wrist and fingers in flexed position on L side unchanged    Assessment/Plan: 1. Functional deficits which require 3+ hours per day of interdisciplinary therapy in a comprehensive inpatient rehab setting. Physiatrist is providing close team supervision and 24 hour management of active medical problems listed below. Physiatrist and rehab team continue to assess barriers to discharge/monitor patient progress toward functional and medical goals  Care Tool:  Bathing    Body parts bathed by patient: Left arm, Chest, Abdomen, Front  perineal area, Right upper leg, Left upper leg, Right lower leg, Face, Left lower leg   Body parts bathed by helper: Buttocks, Right arm Body parts n/a: Left lower leg, Right lower leg, Right arm, Left arm, Front perineal area, Buttocks   Bathing assist Assist Level: Minimal Assistance - Patient > 75% (sitting without standing)     Upper Body Dressing/Undressing Upper body dressing   What is the patient wearing?: Pull over shirt    Upper body assist Assist Level: Moderate Assistance - Patient 50 - 74%    Lower Body Dressing/Undressing Lower body dressing      What is the patient wearing?: Pants, Incontinence brief     Lower body assist Assist for lower body dressing: Maximal Assistance - Patient 25 - 49%     Toileting Toileting    Toileting assist Assist for toileting: Total Assistance - Patient < 25%     Transfers Chair/bed transfer  Transfers assist  Chair/bed transfer activity did not occur: Safety/medical concerns  Chair/bed transfer assist level: Moderate Assistance - Patient 50 - 74% (squat pivot)     Locomotion Ambulation   Ambulation assist   Ambulation activity did not occur: Safety/medical concerns  Assist level: 2 helpers (+2 mod assist with +3 w/c follow to increase distance) Assistive device: Other (comment) (  R UE support around shoulders) Max distance: 67ft   Walk 10 feet activity   Assist  Walk 10 feet activity did not occur: Safety/medical concerns        Walk 50 feet activity   Assist Walk 50 feet with 2 turns activity did not occur: Safety/medical concerns         Walk 150 feet activity   Assist Walk 150 feet activity did not occur: Safety/medical concerns         Walk 10 feet on uneven surface  activity   Assist Walk 10 feet on uneven surfaces activity did not occur: Safety/medical concerns         Wheelchair     Assist Will patient use wheelchair at discharge?: Yes Type of Wheelchair: Manual Wheelchair  activity did not occur: Safety/medical concerns         Wheelchair 50 feet with 2 turns activity    Assist    Wheelchair 50 feet with 2 turns activity did not occur: Safety/medical concerns       Wheelchair 150 feet activity     Assist  Wheelchair 150 feet activity did not occur: Safety/medical concerns       Blood pressure 118/69, pulse 76, temperature 97.8 F (36.6 C), resp. rate 18, height 5\' 10"  (1.778 m), weight 79.6 kg, SpO2 93 %.    Medical Problem List and Plan: 1.  L hemiplegia worsening due to recurrent R CR/BG infarct, has associated spasticity and contracture formation - spasticity has worsened significantly with new infarct , cognitive status declined          Continue CIR PT, OT, SLP, speech dysarthric hypophonic and delayed Appreciate neuro input          ELOS 6/25- goals at least mod A goals- Family training in progress Would rec custodial care at home , HTA has benefits for this as discussed with Dr Amalia Hailey  6/20- Continue CIR- PT, OT and SLP 2.  Antithrombotics: -DVT/anticoagulation:  Pharmaceutical: Lovenox             -antiplatelet therapy: DAPT x3 weeks followed by ASA alone. 3. Pain Management: Continue Tylenol as needed  6/20- asking for tylenol for back pain- will con't regimen 4. Depression: LCSW to follow for evaluation and support. Increase Lexapro to 20mg , continue             -antipsychotic agents: N/AA 5. Neuropsych: This patient is not fully capable of making decisions on his own behalf. Neurostim with amantadine check with team in am , may increase dose 6. Skin/Wound Care: Routine pressure-relief measures. 7. Fluids/Electrolytes/Nutrition: Monitor intake/output.  nl BUN/Cr 8.  History of seizure disorder: Continue Lamictal twice daily 9.  Left spastic hemiparesis: decrease baclofen to 5 mg 4 times daily- may be contributing to drowsiness Had incobotulinum toxin A injection 300U on 04/22/21 to elbow flexors, wrist and finger flexors 6/23  spasticity improved at biceps more so than at wrist and finger flexors now can range without pain somewhat increased after baclofen dose was lowered  10.  History of anxiety disorder: Continue Lexapro.  Hydroxyzine 3 times daily. 11.  Insomnia: Continue melatonin at bedtime.Change trazodone to Seroquel as per neuro.  12.  Hypothyroid: Stable on supplement.  13. Leukocytosis without fever-resolved  14. Elevated LFTs: d/c Lipitor, f/u CMET essentially resolved , will resume at lower dose 20mg  15. AKI: resolved , drinking a little more   6/20- labs look great 6/20- con't to monitor 16.  Dysphagia- poor posture in bed  may need to get in [facility] to eat- no signs of aspiration  17.  Episode of urinary retention last week requiring cath will monitor with daily PVRs  -incontinent but voiding regularly, PT notes some somnolence given incont with occ retention check UA 18.  Decreased appetite , increased somnolence will repeat CT head to check on cerebral edema   LOS: 23 days A FACE TO FACE EVALUATION WAS PERFORMED  Charlett Blake 05/07/2021, 8:34 AM

## 2021-05-07 NOTE — Progress Notes (Signed)
Physical Therapy Discharge Summary  Patient Details  Name: Howard White MRN: [id number] Date of Birth: 01-30-1961  Today's Date: 05/08/2021 PT Individual Time: 1000-1115 PT Individual Time Calculation (min): 75 min    Patient has met 5 of 7 long term goals due to improved activity tolerance, improved balance, improved postural control, increased range of motion, ability to compensate for deficits, improved attention, and improved awareness.  Patient to discharge at a  dependent wheelchair  level requiring hoyer lift transfers to/from hospital bed and [place] wheelchair.  Patient's care partners (mother and father and sister) attended several sessions for hands-on education and training - they demonstrate independence to provide the necessary physical and cognitive assistance at discharge with plan for them to have hired help as well. With skilled assistance pt demonstrates ability to perform bed mobility with mod/max assist and squat pivot transfers with mod assist; however, for safety will be requiring hoyer lift transfers at home.  Reasons goals not met: Pt continues to require varying mod/max assist for bed mobility due to extensor tone in trunk causing strong posterior lean. Pt continues to require up to max assist for trunk control during dynamic sitting balance again due to extensor tone causing strong posterior lean.  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  Equipment: Air cabin crew, loaner custom TIS wheelchair through [employer]; pt already has hospital bed  Reasons for discharge: treatment goals met and discharge from hospital  Patient/family agrees with  progress made and goals achieved: Yes  PT Discharge Precautions/Restrictions Precautions Precautions: Fall;Other (comment) Precaution Comments: severe L hemi hypertonia (UE flexor and LE extensor), R hemi dyskinesia?/restlessness, Pushing L, aphasia, and motor impersistence Restrictions Weight Bearing Restrictions: No Pain Pain Assessment Pain Scale: Faces Pain Score: 0-No pain Perception  Perception Perception: Impaired Inattention/Neglect: Does not attend to left side of body Spatial Orientation: poor general body spatial awareness and impaired midline orientation with pusher syndrome causing L lean Praxis Praxis: Impaired Praxis Impairment Details: Motor planning;Perseveration;Initiation Praxis-Other Comments: Nurse, children's Overall Cognitive Status: Impaired/Different from baseline Arousal/Alertness: Awake/alert Orientation Level: Oriented to person;Oriented to place;Oriented to situation;Disoriented to time Attention: Focused;Sustained;Selective Focused Attention: Appears intact Sustained Attention: Impaired Selective Attention: Impaired Memory: Impaired Awareness: Impaired Problem Solving: Impaired Behaviors: Restless;Impulsive Safety/Judgment: Impaired Sensation Sensation Light Touch: Impaired Detail Light Touch Impaired Details: Impaired LLE Hot/Cold: Not tested Proprioception: Impaired Detail Proprioception Impaired Details: Impaired LLE Stereognosis: Not tested Coordination Gross Motor Movements are Fluid and Coordinated: No Coordination and Movement Description: Pt unable to functionally use the LUE with severe tone in the elbow, wrist, and digit flexors. Pt has some functional use of L LE for transfers and bed level tasks; however, requries manual facilitation to position and stabilize LE due to severe extensor hypertonia. Pt with significant truncal impairments with strong posterior lean. Heel Shin Test: unable with L LE due to severe  extensor hypertonia Motor  Motor Motor: Abnormal tone;Hemiplegia;Other (comment);Abnormal postural alignment and control;Motor impersistence;Motor perseverations Motor - Discharge Observations: Severe L hemibody hypertonicity, R hemi body dyskinesia, truncal and cervical hypertonicity present, L Pusher  Mobility Bed Mobility Bed Mobility: Rolling Right;Rolling Left;Supine to Sit;Sit to Supine Rolling Right: Maximal Assistance - Patient 25-49% Rolling Left: Supervision/Verbal cueing (using bedrails) Supine to Sit: Maximal  Assistance - Patient - Patient 25-49%;Moderate Assistance - Patient 50-74% (via logroll) Sit to Supine: Moderate Assistance - Patient 50-74% (via reverse logroll) Transfers Transfers: Sit to Stand;Stand to Sit Sit to Stand: Maximal Assistance - Patient 25-49%;Total Assistance - Patient < 25% Stand to Sit: Maximal Assistance - Patient 25-49%;Total Assistance - Patient < 25% Squat Pivot Transfers: Moderate Assistance - Patient 50-74% (squat pivot) Transfer (Assistive device): None Locomotion  Gait Ambulation: Yes (only able to ambulate 2x during CIR stay) Gait Assistance: 2 Helpers;Moderate Assistance - Patient 50-74% (+2 mod assist with 3rd person wheelchair follow) Gait Distance (Feet): 73 Feet Assistive device: Other (Comment) (R UE around +2 assist's shoulders) Gait Assistance Details: Tactile cues for sequencing;Tactile cues for initiation;Tactile cues for posture;Tactile cues for weight shifting;Manual facilitation for weight shifting;Manual facilitation for placement;Verbal cues for gait pattern;Verbal cues for technique;Verbal cues for sequencing;Visual cues/gestures for sequencing;Tactile cues for placement Gait Gait: Yes Gait Pattern: Impaired Gait Pattern: Poor foot clearance - left;Narrow base of support;Decreased step length - left;Decreased step length - right;Step-to pattern;Step-through pattern;Decreased hip/knee flexion - left;Decreased stance time -  left;Lateral trunk lean to left (requires manual facilitation for advancement of L LE during swing to prevent excessive adduction (due to tone); howeve, pt able to bring it forward >80% of the time) Stairs / Additional Locomotion Stairs: No Wheelchair Mobility Wheelchair Mobility: No (requires dependent transportation in TIS wheelchair)  Trunk/Postural Assessment  Cervical Assessment Cervical Assessment: Exceptions to [facility] (significant cervical protraction with mild L lateral tilt) Thoracic Assessment Thoracic Assessment: Exceptions to [facility] (significant thoracic kyphosis) Lumbar Assessment Lumbar Assessment: Exceptions to [facility] (significant posterior pelvic tilt) Postural Control Postural Control: Deficits on evaluation Head Control: maintains cervical protracted positioning Trunk Control: Pushes L, maintains L lateral trunk lean sitting and supine along with strong posterior trunk lean in sitting Righting Reactions: delayed, inadequate, and with poor motor planning  Balance Balance Balance Assessed: Yes Static Sitting Balance Static Sitting - Balance Support: Feet supported Static Sitting - Level of Assistance: 4: Min assist Dynamic Sitting Balance Dynamic Sitting - Balance Support: Feet supported Dynamic Sitting - Level of Assistance: 2: Max assist Static Standing Balance Static Standing - Balance Support: During functional activity;Right upper extremity supported Static Standing - Level of Assistance: 2: Max assist;1: +1 Total assist Dynamic Standing Balance Dynamic Standing - Balance Support: During functional activity;Right upper extremity supported Dynamic Standing - Level of Assistance: 1: +2 Total assist Extremity Assessment      RLE Assessment RLE Assessment: Within Functional Limits LLE Assessment LLE Assessment: Exceptions to [facility] Passive Range of Motion (PROM) Comments: decreased ankle DF ROM LLE Strength LLE Overall Strength Comments: difficult to determine strength  due to severe extensor tone; pt able to maintain knee extension in standing position without manual facilitation LLE Tone LLE Tone: Severe;Hypertonic;Modified Ashworth Body Part - Modified Ashworth Scale: Hamstrings Hamstrings - Modified Ashworth Scale for Grading Hypertonia LLE: Slight increase in muscle tone, manifested by a catch, followed by minimal resistance throughout the remainder (less than half) of the ROM Quadriceps - Modified Ashworth Scale for Grading Hypertonia LLE: Considerable increase in muschle tone, passive movement difficult Gastrocnemius - Modified Ashworth Scale for Grading Hypertonia LLE: Considerable increase in muschle tone, passive movement difficult    Tawana Scale , PT, DPT, CSRS 05/07/2021, 1:46 PM

## 2021-05-07 NOTE — Progress Notes (Signed)
Speech Language Pathology Daily Session Note  Patient Details  Name: Howard White MRN: [id number] Date of Birth: [birth year]  Today's Date: 05/07/2021 SLP Individual Time: 7408-1448 SLP Individual Time Calculation (min): 74 min  Short Term Goals: Week 3: SLP Short Term Goal 1 (Week 3): Patient will tolerate trials of Dys 3 solids textures with minA for clearing oral pocketing/residuals. SLP Short Term Goal 2 (Week 3): Patient will achieve adequate vocal intensity to produce 3-4 word phrases with modA cues. SLP Short Term Goal 3 (Week 3): Patient will demonstrate awareness to errors during functional tasks with mod-maxA cues. SLP Short Term Goal 4 (Week 3): Patient will demonstrated sustained attention to basic level task with minA to redirect.  Skilled Therapeutic Interventions:Skilled ST services focused on education, swallow and speech skills. Pt's parents present for education. SLP reviewed MBS x-ray slides with family, noting consistent penetration with 37ml of thin liquids and sensed aspiration of thin liquids larger than 10 ml. Pt is able to protect airway with 5 ml of thin liquids and educated pt on increasing volume of thin liquids with f/u ST at bedside with improved positioning, cognition and carryover of swallow initiation exercises. Pt consumed dys 2 textures and thin via provale cup (19ml) snack with supervision A verbal cues to clear left buccal pocketing and no overt s/s aspiration noted. Pt's supported anxeity was impacting speech intelligibility during session with staff in and out of the room. SLP facilitated use of speech intelligibility strategies in structure picture description task pt required max A fading to mod A verbal cues to increase vocal intensity. All questions answered to satisfaction. Pt was left with family and bed alarm set. Recommend to continue ST services.     Pain Pain Assessment Pain Scale: Faces Pain Score: 0-No pain  Therapy/Group: Individual  Therapy  Malkia Nippert  [facility] 05/07/2021, 10:26 AM

## 2021-05-08 MED ORDER — HYDROXYZINE HCL 25 MG PO TABS: 25.0000 mg | ORAL_TABLET | Freq: Three times a day (TID) | ORAL | 0 refills | Status: AC

## 2021-05-08 MED ORDER — BACLOFEN 5 MG PO TABS: 5.0000 mg | ORAL_TABLET | Freq: Three times a day (TID) | ORAL | 0 refills | Status: AC

## 2021-05-08 MED ORDER — ATORVASTATIN CALCIUM 40 MG PO TABS: 20.0000 mg | ORAL_TABLET | Freq: Every evening | ORAL | Status: AC

## 2021-05-08 MED ORDER — LAMOTRIGINE 150 MG PO TABS: 225.0000 mg | ORAL_TABLET | ORAL | Status: DC

## 2021-05-08 MED ORDER — CAMPHOR-MENTHOL 0.5-0.5 % EX LOTN: TOPICAL_LOTION | CUTANEOUS | 0 refills | Status: AC | PRN

## 2021-05-08 MED ORDER — ESCITALOPRAM OXALATE 10 MG PO TABS: 20.0000 mg | ORAL_TABLET | Freq: Every day | ORAL | 0 refills | Status: DC

## 2021-05-08 MED ORDER — QUETIAPINE FUMARATE 25 MG PO TABS: ORAL_TABLET | ORAL | 0 refills | Status: AC

## 2021-05-08 MED ORDER — ACETAMINOPHEN 325 MG PO TABS: 325.0000 mg | ORAL_TABLET | ORAL | Status: AC | PRN

## 2021-05-08 MED ORDER — ESCITALOPRAM OXALATE 10 MG PO TABS: 20.0000 mg | ORAL_TABLET | Freq: Every day | ORAL | 0 refills | Status: AC

## 2021-05-08 NOTE — Plan of Care (Signed)
  Problem: RH BLADDER ELIMINATION Goal: RH STG MANAGE BLADDER WITH ASSISTANCE Description: STG Manage Bladder With mod I Assistance Outcome: Progressing

## 2021-05-08 NOTE — Progress Notes (Signed)
Physical Therapy Session Note  Patient Details  Name: Howard White MRN: [id number] Date of Birth: 04/02/1961  Today's Date: 05/08/2021 PT Individual Time: 1000-1115 PT Individual Time Calculation (min): 75 min   Short Term Goals: Week 1:  PT Short Term Goal 1 (Week 1): Patient will roll B with no more than MaxA PT Short Term Goal 1 - Progress (Week 1): Met PT Short Term Goal 2 (Week 1): Patient will complete sit <> supine with no more than ModA PT Short Term Goal 2 - Progress (Week 1): Progressing toward goal PT Short Term Goal 3 (Week 1): Patient will complete sit <> stand with LRAD and +2 MaxA safely PT Short Term Goal 3 - Progress (Week 1): Met Week 2:  PT Short Term Goal 1 (Week 2): Pt will perform supine<>sit with mod assist of 1 PT Short Term Goal 1 - Progress (Week 2): Progressing toward goal PT Short Term Goal 2 (Week 2): Pt will perform sit<>stand with +2 mod assist PT Short Term Goal 2 - Progress (Week 2): Progressing toward goal PT Short Term Goal 3 (Week 2): Pt will perform bed<>chair transfers with mod assist of 1 PT Short Term Goal 3 - Progress (Week 2): Progressing toward goal PT Short Term Goal 4 (Week 2): Pt will initiate gait training PT Short Term Goal 4 - Progress (Week 2): Met Week 3:  PT Short Term Goal 1 (Week 3): Pt will consistently perform supine<>sit with mod assist PT Short Term Goal 1 - Progress (Week 3): Progressing toward goal (still with inconsistencies) PT Short Term Goal 2 (Week 3): Pt will consistently perform bed<>chair transfers with mod assist of 1 PT Short Term Goal 2 - Progress (Week 3): Progressing toward goal (still with inconsistencies) PT Short Term Goal 3 (Week 3): Pt will perform sit<>stands with max assist of 1 PT Short Term Goal 3 - Progress (Week 3): Met PT Short Term Goal 4 (Week 3): Pt will ambulate at least 19f using LRAD with +2 mod assist PT Short Term Goal 4 - Progress (Week 3): Met Week 4:  PT Short Term Goal 1 (Week 4): = to  LTGs based on ELOS  Skilled Therapeutic Interventions/Progress Updates:   Pain:  Pt reports N0 pain.  Treatment to tolerance.  Rest breaks and repositioning as needed.  Pt family present for family training to address the following: Positioning of LUE for donning elbow brace Donning elbow brace, prolonged stretching of LUE in prep for positioning brace, hand psoitioning for safety/leverage w/stretch  HHarrel Lemonlift transfers Reinforced education with pt's mother and niece on how to perform hoyer lift transfers and had niece  provide hands-on assistance to perform hoyer transfer wc to TIS and w/c to bed. Reinforced the following: - proper set-up of wheelchair and bed in preparation for transfer - how to don sling in the wheelchair with 2 person assist for trunk control/sitting balance safety - how to attach sling to hoyer lift, crossing it at the legs for pt safety - locking hoyer lift brakes - need for +2 assist when transporting pt in hoyer lift to the bed - how to open/close hoyer lift legs to allow it to fit around wheelchair - how to raise/lower patient in the hoyer Use of hospital features to decrease caregiver work w/positioning of sling, use of lift.  Also discussed best sling settings to facilitate transfer to wc/bed Mother observed and questions answered.  Loaner wc used for transfer and tech present for assit w/adjusting all parts for  ideal fit w/pt seated in wc.  Therapist demonstrated /Reviewed operation of all parts, use of tilt feature for positioning/repositioning/pressure relief. Niece demonstrated use of parts including lateral trunk support.  Discussed  Importance of proper positioning to maximize comfort and pressure distribution.  Use of tilt for pressure relief.  Also demonstrated positioning of sling in wc and importance of checing alignment of sling to ensure no catching or binding w/wc parts esp lateral trunk support/armrests prior to lift.   Pt returned to bed so wc tech  could take wc for transport to home.  At end of session, pt was left supine in beds w/alarm set, needs in reach, fall mats in place, ST in for next training session.   Therapy Documentation Precautions:  Precautions Precautions: Fall, Other (comment) Precaution Comments: severe L hemi hypertonia (UE flexor and LE extensor), R hemi dyskinesia?/restlessness, Pushing L, aphasia, and motor impersistence Restrictions Weight Bearing Restrictions: No    Therapy/Group: Individual Therapy Callie Fielding, [place] 05/08/2021, 12:46 PM

## 2021-05-08 NOTE — Progress Notes (Signed)
Patient ID: Howard White, male   DOB: 08-29-1961, 60 y.o.   MRN: [id number]  SW met with pt mother and sister. Confirmed discharge time of 9:30 AM for transportation on 6/25. SW provided pt mother with non medical transport resources. Confirmed all DME arrived to pt's home. Competency letter will be provided to pt's mother today. Family will take most of pt belongings home on today, family informed of PTAR allowing one bag. Pt mother will leave an outfit for transport on tomorrow. Please ensure to pack pt's cup with him on transport. Pt orders faxed to [facility] for PTOTST follow up, with potential SOC of Monday, 6/27. HTA/THN referral faxed on 6/9 and 6/21. Pt to be followed by [facility] for custodial care services and potentially personal care services. No additional questions or concerns from family.   [place], [place]

## 2021-05-08 NOTE — Discharge Summary (Signed)
Physician Discharge Summary  Patient ID: Howard White MRN: [id number] DOB/AGE: [birth year] 60 y.o.  Admit date: 04/14/2021 Discharge date: 05/09/2021  Discharge Diagnoses:  Principal Problem:   Acute stroke of basal ganglia (HCC) Active Problems:   Epilepsy ([place])   Sleep disturbance   Neurogenic bladder   History of astrocytoma   Discharged Condition: stable  Significant Diagnostic Studies: DG Chest 1 View  Result Date: 04/20/2021 CLINICAL DATA:  Aspiration of food. EXAM: CHEST  1 VIEW COMPARISON:  Radiograph 04/16/2021 FINDINGS: The cardiomediastinal contours are normal. Similar streaky left basilar opacities to prior. Pulmonary vasculature is normal. No pleural effusion or pneumothorax. No acute osseous abnormalities are seen. IMPRESSION: Similar streaky left basilar opacities to prior, atelectasis or can be seen in the setting of aspiration. Electronically Signed   By: Keith Rake M.D.   On: 04/20/2021 21:30   CT HEAD WO CONTRAST  Result Date: 05/07/2021 CLINICAL DATA:  Stroke, follow-up examination EXAM: CT HEAD WITHOUT CONTRAST TECHNIQUE: Contiguous axial images were obtained from the base of the skull through the vertex without intravenous contrast. COMPARISON:  CT and MRI 04/10/2021 FINDINGS: Brain: Surgical changes of left frontal craniotomy and resection cavity within the left frontal lobe are again identified, unchanged. Moderate parenchymal volume loss is stable when compared to prior examination. Similarly, diffuse subcortical and periventricular white matter hypoattenuation is unchanged possibly reflecting the sequela of small vessel ischemia or radiation therapy in the appropriate clinical setting. Remote infarct within the right basal ganglia and corona radiata again noted, unchanged. No evidence of acute intracranial hemorrhage or infarct. Mild mass effect upon the anterior horn of the left lateral ventricle by the cystic resection cavity is unchanged with stable 5-6 mm  left right midline shift anteriorly. Ventricular size is normal. Cerebellum is unremarkable. Vascular: No asymmetric hyperdense vasculature at the skull base. Skull: No acute fracture Sinuses/Orbits: The paranasal sinuses are clear. The orbits are unremarkable. Other: The mastoid air cells and middle ear cavities are clear. IMPRESSION: Stable appearance of a chronic right basal ganglia and corona radiata infarct. No evidence of interval hemorrhage or acute infarct. Stable changes of left frontal resection with mild mass effect of the resection cavity upon the anterior horn of the left lateral ventricle and resultant midline shift. Stable diffuse white matter changes, nonspecific. This may reflect changes of radiation therapy in the appropriate clinical setting. Electronically Signed   By: Fidela Salisbury MD   On: 05/07/2021 19:58    DG CHEST PORT 1 VIEW  Result Date: 04/16/2021 CLINICAL DATA:  Leukocytosis, stroke. EXAM: PORTABLE CHEST 1 VIEW COMPARISON:  Multiple priors, most recent Apr 10, 2021. FINDINGS: Similar mild patient rotation. Low lung volumes with streaky left basilar opacities. No visible pleural effusions or pneumothorax on this single AP supine radiograph. Cardiomediastinal silhouette is similar. IMPRESSION: Streaky left basilar opacities, favored to reflect atelectasis given low lung volumes and similar appearance on the priors. Aspiration or pneumonia is not excluded. Electronically Signed   By: Margaretha Sheffield MD   On: 04/16/2021 08:33    Labs:  Basic Metabolic Panel: BMP Latest Ref Rng & Units 05/04/2021 04/27/2021 04/20/2021  Glucose 70 - 99 mg/dL 87 97 92  BUN 6 - 20 mg/dL 20 19 22(H)  Creatinine 0.61 - 1.24 mg/dL 1.24 1.07 0.85  BUN/Creat Ratio 9 - 20 - - -  Sodium 135 - 145 mmol/L 141 139 142  Potassium 3.5 - 5.1 mmol/L 3.6 4.2 3.8  Chloride 98 - 111 mmol/L 101 104 110  CO2  22 - 32 mmol/L 29 28 27   Calcium 8.9 - 10.3 mg/dL 9.9 9.4 8.8(L)     CBC: CBC Latest Ref Rng & Units  05/04/2021 04/27/2021 04/20/2021  WBC 4.0 - 10.5 K/uL 6.5 7.8 6.0  Hemoglobin 13.0 - 17.0 g/dL 16.2 15.5 13.8  Hematocrit 39.0 - 52.0 % 48.4 45.7 40.7  Platelets 150 - 400 K/uL 333 308 258     CBG: No results for input(s): GLUCAP in the last 168 hours.  Brief HPI:   Howard White is a 60 y.o. male with history of left frontal craniotomy for astrocytoma, seizure disorder, CVA 09/2020 with residual left hemiplegia was admitted on 04/08/2021 with reports of multiple falls, increase in sleepiness and decrease in interaction with progressive weakness.  MRI brain done showing acute small vessel infarct involving right corona radiata and adjacent basal ganglia.  He had issues with tremors right upper extremity with no evidence of seizures per neurology.  EEG done showing evidence of epileptogenicity  and cortical dysfunction at underlying craniotomy site but no seizures noted during recording.  Dr. Leonie Man felt the stroke was due to small vessel disease and recommended DAPT x3 weeks followed by aspirin alone.  Patient with resultant motor speech deficits with decrease in attention, balance deficits with decrease in motor coordination of right upper and right lower extremity with tendency to push to the left, posturing with LUE with significant tone as well as left lower extremity weakness, restlessness with anxiety and poor safety awareness.  CIR was recommended due to functional decline.   Hospital Course: Horice Carrero was admitted to rehab 04/14/2021 for inpatient therapies to consist of PT, ST and OT at least three hours five days a week. Past admission physiatrist, therapy team and rehab RN have worked together to provide customized collaborative inpatient rehab.  His blood pressures were monitored on TID basis and has been controlled without meds.  He has been seizure-free on Lamictal.   Follow-up labs show H&H and platelets to be stable.  Check of electrolytes showed mild AKI which has resolved with improvement  in fluid intake. Lipitor was held briefly due to abnormal LFTs and resumed at lower dose as labs improved.  Reactive leukocytosis noted and urine culture showed 20,000 colonies of Pseudomonas aeruginosa felt to be due to colonization as UA was negative.  Chest x-ray was negative for acute changes. Dr. Erlinda Hong with neurology was consulted for input on ongoing restlessness and did not feel that new stroke or seizures were contributing to his symptoms.  He recommended hydrating patient with IV fluids x3 days as well as changing trazodone to Seroquel 25 mg BID and encouraging p.o. intake.    His mentation has gradually improved with decreasing agitation and improvement in cognition.  His level of alertness has also greatly improved with resolution of lethargy. His intake has gradually improved with advancement of diet to dysphagia 2.  He continued to have significant spasticity and baclofen was added to help manage symptoms.  He developed excessive sedation with titration of baclofen to 50 mg 4 times daily therefore this was decreased down to 5 mg.  Follow up CT head done for work up and showed no acute changes.  He did receive Botox injections to elbow flexors, wrist as well as finger flexors on 06/22 with improvement in range of motion of wrist and finger flexors.  He continues to be limited by left spastic hemiplegia, significant impulsivity, impaired attention and restlessness affecting overall functional status.  He currently requires max  assist at wheelchair level with use of Hoyer lift for transfers.  Family will continue to provide assistance as needed past discharge.  He will receive few hours of custodial care by [facility] home care as well as follow-up HHPT, [place] and HHST by [facility] after discharge   Rehab course: During patient's stay in rehab weekly team conferences were held to monitor patient's progress, set goals and discuss barriers to discharge. At admission, patient required total assist  with basic self-care and ADL tasks. He exhibited severe dysarthria with mild dysphagia severe cognitive deficits and severe expressive receptive communication deficits. He  has had improvement in activity tolerance, balance, postural control as well as ability to compensate for deficits. He requires mod to max assist for bed mobility and max assist for sit to stand transfers.  He is able to ambulate 73 feet with +2 mod assist and manual facilitation for weight shifting and gait pattern.  Family was educated on using Hoyer lift for transfers. He is tolerating dysphagia to thin liquids via probiotic And needs cues to clear pocketing.  His cognitive linguistic and speech function have improved with adequate vocal quality at word and phrase level. Family education was completed regarding all aspects of safety and care.  Disposition: Home  Diet: Dysphagia 2, thin liquids.   Special Instructions: Needs assistance with meals. .   Discharge Instructions     Ambulatory referral to Neurology   Complete by: As directed    An appointment is requested in approximately: 2-4 weeks   Ambulatory referral to Physical Medicine Rehab   Complete by: As directed    1-2 week TC appt      Allergies as of 05/09/2021       Reactions   Lactose Intolerance (gi)         Medication List     STOP taking these medications    clopidogrel 75 MG tablet Commonly known as: PLAVIX       TAKE these medications    acetaminophen 325 MG tablet Commonly known as: TYLENOL Take 1-2 tablets (325-650 mg total) by mouth every 4 (four) hours as needed for mild pain. What changed:  how much to take when to take this reasons to take this   aspirin 81 MG EC tablet Take 1 tablet (81 mg total) by mouth daily. Swallow whole. What changed: when to take this   atorvastatin 40 MG tablet Commonly known as: LIPITOR Take 0.5 tablets (20 mg total) by mouth every evening. What changed: how much to take   Baclofen 5 MG  Tabs Take 5 mg by mouth 4 (four) times daily - after meals and at bedtime. What changed:  how much to take when to take this additional instructions   calcium carbonate 500 MG chewable tablet Commonly known as: TUMS - dosed in mg elemental calcium Chew 1 tablet (200 mg of elemental calcium total) by mouth daily. What changed:  when to take this reasons to take this   camphor-menthol lotion Commonly known as: SARNA Apply topically as needed for itching.   docusate sodium 100 MG capsule Commonly known as: COLACE Take 2 capsules (200 mg total) by mouth 2 (two) times daily. What changed:  when to take this reasons to take this   escitalopram 10 MG tablet Commonly known as: LEXAPRO Take 2 tablets (20 mg total) by mouth daily. What changed: how much to take   famotidine 20 MG tablet Commonly known as: PEPCID Take 1 tablet (20 mg total) by  mouth daily.   fluticasone 50 MCG/ACT nasal spray Commonly known as: FLONASE Place 1 spray into both nostrils 2 (two) times daily as needed for allergies.   hydrOXYzine 25 MG tablet Commonly known as: ATARAX/VISTARIL Take 1 tablet (25 mg total) by mouth 3 (three) times daily.   ketotifen 0.025 % ophthalmic solution Commonly known as: ZADITOR Place 1 drop into both eyes 2 (two) times daily as needed (for itchy eyes).   lamoTRIgine 150 MG tablet Commonly known as: LAMICTAL Take 1.5-2 tablets (225-300 mg total) by mouth See admin instructions. TAKE 1 AND 1/2 TABS ( 225mg )  EVERY MORNING AND 2 TAB (300mg ) EVERY EVENING   LORazepam 0.5 MG tablet Commonly known as: ATIVAN Take 1 tablet (0.5 mg total) by mouth every 6 (six) hours as needed for anxiety.   melatonin 3 MG Tabs tablet Take 2 tablets (6 mg total) by mouth at bedtime. What changed:  when to take this reasons to take this   multivitamin with minerals tablet Take 1 tablet by mouth daily.   polyethylene glycol 17 g packet Commonly known as: MIRALAX / GLYCOLAX Take 17 g by  mouth daily. What changed:  when to take this reasons to take this   QUEtiapine 25 MG tablet Commonly known as: SEROQUEL Take 1 tablet (25 mg total) by mouth daily AND 2 tablets (50 mg total) at bedtime.   Synthroid 112 MCG tablet Generic drug: levothyroxine Take 1 tablet (112 mcg total) by mouth at bedtime.        Follow-up Information     Seward Carol, MD Follow up.   Specialty: Internal Medicine Why: for post hospita follow up Contact information: [address] [place] [address] [phone]         Charlett Blake, MD Follow up.   Specialty: Physical Medicine and Rehabilitation Why: Office will call you with follow up appt Contact information: [place] [address] [phone]         [place]. Call in 3 day(s).   Why: for stroke follow up Contact information: [address]     [place] [address] [phone]                Signed: Bary Leriche 05/11/2021, 7:08 PM

## 2021-05-08 NOTE — Progress Notes (Signed)
Speech Language Pathology Discharge Summary  Patient Details  Name: Howard White MRN: [id number] Date of Birth: 08-17-1961  Today's Date: 05/08/2021 SLP Individual Time: 1100-1140 SLP Individual Time Calculation (min): 40 min   Skilled Therapeutic Interventions:  SLP completed family education with patient, mother and sibling. SLP focused on dysphagia strategies, specific nature of his swallow impairment and emphasized oral care after all PO's. SLP also discussed patient's improvements with speech/voice as well as cognition, but provided recommendations and strategies to continue working with patient on attention, problem solving, functional communication.      Patient has met 4 of 4 long term goals.  Patient to discharge at overall Supervision;Min;Mod level.  Reasons goals not met: N/A   Clinical Impression/Discharge Summary: Patient made good progress overall and met all 4 LTG's. At time of discharge, he is tolerating Dys 2 solids, thin liquids (via small controlled cup sips with Provale 5cc) and cues for clearing pocketing in oral cavity. Patient has improved significantly with his cognitive-linguistic and speech function with ability to achieve adequate vocal intensity at word and short phrase level. Patient continues with significantly impaired impulsivity, attention and restlessness which impact his overall function. SLP has completed family education and patient to be receiving [facility] SLP services upon discharge.  Care Partner:  Caregiver Able to Provide Assistance: Yes  Type of Caregiver Assistance: Physical;Cognitive  Recommendation:  24 hour supervision/assistance;Home Health SLP  Rationale for SLP Follow Up: Maximize functional communication;Reduce caregiver burden;Maximize cognitive function and independence;Maximize swallowing safety   Equipment: Provale cup (5cc)   Reasons for discharge: Discharged from hospital   Patient/Family Agrees with Progress Made and Goals Achieved:  Yes   Sonia Baller, MA, CCC-SLP Speech Therapy

## 2021-05-08 NOTE — Progress Notes (Addendum)
Patient ID: Howard White, male   DOB: [birth year], 60 y.o.   MRN: [id number]  SW unable to schedule ahead for PT[facility] transport on tomorrow 6/25. Nursing: please call in the AM to schedule PT[facility] transport for for patient at [phone] for 9:30AM. Patient discharge packet for transport will be left at the nursing station on 6/24 for tomorrow.   [place], [place]

## 2021-05-08 NOTE — Progress Notes (Signed)
Inpatient Rehabilitation Care Coordinator Discharge Note  The overall goal for the admission was met for:   Discharge location: Yes, Home   Length of Stay: Yes, 25 Days  Discharge activity level: Yes, , MOD/MAX A  Home/community participation: Yes  Services provided included: MD, RD, PT, OT, SLP, RN, CM, TR, Pharmacy, Neuropsych, and SW  Financial Services: Private Insurance: HTA  Choices offered to/list presented to:PT Mother [name])  Follow-up services arranged: [facility] Health: [facility]   Comments (or additional information): [facility]TA Custodial Care: [facility] PT OT [facility] Bed and Custom Wheelchair   Patient/Family verbalized understanding of follow-up arrangements: Yes  Individual responsible for coordination of the follow-up plan: [name] [phone]  Confirmed correct DME delivered: [name] 05/08/2021    [name]

## 2021-05-08 NOTE — Progress Notes (Signed)
Occupational Therapy Session Note  Patient Details  Name: Howard White MRN: [id number] Date of Birth: September 11, 1961  Today's Date: 05/08/2021 OT Individual Time: 9470-9628 OT Individual Time Calculation (min): 27 min   Skilled Therapeutic Interventions/Progress Updates:    Pt greeted in bed, visibly restless and moving in the bed. Pt found to be incontinent of urine, soaking through his pants and chuck pad. +2 for hygiene/brief/bed change with pt rolling Rt>Lt with Mod A for rolling towards the Lt and Total A for rolling towards the Rt. Educated pt to notify staff whenever he felt like he needed toileting assistance for skin integrity purposes. Afterwards, assisted pt in obtaining/maintaining sidelying/partial sidelying position in bed to promote comfort and also for pressure relief. Pt wiggling himself out of this position after a few minutes. He remained in bed at close of session, all 4 bedrails up, call bell within reach on the Rt side, fall mats in place, and bed alarm set.   Therapy Documentation Precautions:  Precautions Precautions: Fall, Other (comment) Precaution Comments: severe L hemi hypertonia (UE flexor and LE extensor), R hemi dyskinesia?/restlessness, Pushing L, aphasia, and motor impersistence Restrictions Weight Bearing Restrictions: No  Vital Signs: Therapy Vitals Temp: 98.7 F (37.1 C) Temp Source: Oral Pulse Rate: 77 Resp: 20 BP: 112/70 Patient Position (if appropriate): Lying Oxygen Therapy SpO2: 91 % O2 Device: Room Air Pain: in lower back, assisted pt with using the call button to notify RN that he wanted some pain medicine to address it   ADL: ADL Eating: Supervision/safety Where Assessed-Eating: Wheelchair Grooming: Supervision/safety Where Assessed-Grooming: Wheelchair Upper Body Bathing: Minimal assistance Where Assessed-Upper Body Bathing: Chair Lower Body Bathing: Maximal assistance Where Assessed-Lower Body Bathing: Shower Upper Body Dressing:  Maximal assistance Where Assessed-Upper Body Dressing: Wheelchair Lower Body Dressing: Dependent Where Assessed-Lower Body Dressing: Wheelchair Toileting: Dependent Where Assessed-Toileting: Bedside Commode Toilet Transfer: Maximal assistance (squat pivot) Toilet Transfer Method: Engineer, water: Drop arm bedside commode Tub/Shower Transfer: Not assessed Social research officer, government: Dependent Social research officer, government Method: Other (comment) Charlaine Dalton) Walk-In Shower Equipment: Radio broadcast assistant     Therapy/Group: Individual Therapy  Earon Rivest A Dyer Klug 05/08/2021, 4:04 PM

## 2021-05-08 NOTE — Progress Notes (Signed)
Occupational Therapy Session Note  Patient Details  Name: Howard White MRN: [id number] Date of Birth: 1961/07/22  Today's Date: 05/08/2021 OT Individual Time: 0900-1000 OT Individual Time Calculation (min): 60 min    Short Term Goals: Week 3:  OT Short Term Goal 1 (Week 3): Continue working on estabilished LTGs set at [employer].  Skilled Therapeutic Interventions/Progress Updates:    OT intervention with focus on ongoing family education. [name] and sister present. Pt's sister actively participated in bathing/dressing at bed level. Focus on body mechanics and LUE PROM. Pt's sister return demonstrated donning resting hand splint and LUE brace. RN Pearline Cables continued education on splint and brace. Recommended two persons be present for self care and donning splints, braces, and hoyer transfers. Pt remained in bed with all needs within reach. RN, mother, and sister present.   Therapy Documentation Precautions:  Precautions Precautions: Fall, Other (comment) Precaution Comments: severe L hemi hypertonia (UE flexor and LE extensor), R hemi dyskinesia?/restlessness, Pushing L, aphasia, and motor impersistence Restrictions Weight Bearing Restrictions: No  Pain: Pt with no s/s of pain   Therapy/Group: Individual Therapy  Leroy Libman 05/08/2021, 12:30 PM

## 2021-05-08 NOTE — Discharge Instructions (Addendum)
        Inpatient Rehab Discharge Instructions  Tejuan Gholson Discharge date and time: 05/09/21   Activities/Precautions/ Functional Status: Activity: activity as tolerated Diet: cardiac diet Wound Care: none needed   Functional status:  ___ No restrictions     ___ Walk up steps independently __X_ 24/7 supervision/assistance   ___ Walk up steps with assistance ___ Intermittent supervision/assistance  ___ Bathe/dress independently ___ Walk with walker     _X__ Bathe/dress with assistance ___ Walk Independently    ___ Shower independently ___ Walk with assistance    ___ Shower with assistance _X__ No alcohol     ___ Return to work/school ________  Special Instructions:  Home Health:   PT     [place]    Medical Equipment/Items Ordered:Hospital Bed, Civil Service fast streamer, Air cabin crew                                                  Agency/Supplier:[employer]   GENERAL COMMUNITY RESOURCES FOR PATIENT/FAMILY:   Caregiver Support:[facility] Custodial Care  N/A:[facility]   My questions have been answered and I understand these instructions. I will adhere to these goals and the provided educational materials after my discharge from the hospital.  Patient/Caregiver Signature _______________________________ Date __________  Clinician Signature _______________________________________ Date __________  Please bring this form and your medication list with you to all your follow-up doctor's appointments.  COMMUNITY REFERRALS UPON DISCHARGE:

## 2021-05-08 NOTE — Progress Notes (Signed)
9                                                        PROGRESS NOTE   Subjective/Complaints:  Very fidgety this am, lethargy improved on lower dose baclofen Ate breakfast with assist CT head reviewed no new changes discussed with pt   ROS: limited due to language/communication    Objective:   CT HEAD WO CONTRAST  Result Date: 05/07/2021 CLINICAL DATA:  Stroke, follow-up examination EXAM: CT HEAD WITHOUT CONTRAST TECHNIQUE: Contiguous axial images were obtained from the base of the skull through the vertex without intravenous contrast. COMPARISON:  CT and MRI 04/10/2021 FINDINGS: Brain: Surgical changes of left frontal craniotomy and resection cavity within the left frontal lobe are again identified, unchanged. Moderate parenchymal volume loss is stable when compared to prior examination. Similarly, diffuse subcortical and periventricular white matter hypoattenuation is unchanged possibly reflecting the sequela of small vessel ischemia or radiation therapy in the appropriate clinical setting. Remote infarct within the right basal ganglia and corona radiata again noted, unchanged. No evidence of acute intracranial hemorrhage or infarct. Mild mass effect upon the anterior horn of the left lateral ventricle by the cystic resection cavity is unchanged with stable 5-6 mm left right midline shift anteriorly. Ventricular size is normal. Cerebellum is unremarkable. Vascular: No asymmetric hyperdense vasculature at the skull base. Skull: No acute fracture Sinuses/Orbits: The paranasal sinuses are clear. The orbits are unremarkable. Other: The mastoid air cells and middle ear cavities are clear. IMPRESSION: Stable appearance of a chronic right basal ganglia and corona radiata infarct. No evidence of interval hemorrhage or acute infarct. Stable changes of left frontal resection with mild mass effect of the resection cavity upon the anterior horn of the left lateral ventricle and resultant midline shift.  Stable diffuse white matter changes, nonspecific. This may reflect changes of radiation therapy in the appropriate clinical setting. Electronically Signed   By: Fidela Salisbury MD   On: 05/07/2021 19:58    No results for input(s): WBC, HGB, HCT, PLT in the last 72 hours.   No results for input(s): NA, K, CL, CO2, GLUCOSE, BUN, CREATININE, CALCIUM in the last 72 hours.    Intake/Output Summary (Last 24 hours) at 05/08/2021 0811 Last data filed at 05/07/2021 1710 Gross per 24 hour  Intake 240 ml  Output --  Net 240 ml         Physical Exam: Vital Signs Blood pressure 109/73, pulse (!) 58, temperature 98 F (36.7 C), resp. rate 18, height 5\' 10"  (1.778 m), weight 79.6 kg, SpO2 94 %.   General: No acute distress Mood and affect are appropriate Heart: Regular rate and rhythm no rubs murmurs or extra sounds Lungs: Clear to auscultation, breathing unlabored, no rales or wheezes Abdomen: Positive bowel sounds, soft nontender to palpation, nondistended Extremities: No clubbing, cyanosis, or edema Skin: No evidence of breakdown, no evidence of rash    Neurologic:Motor 0/5 LUE, LLE 2-3/5--no change Tone  MAS 3 in Left biceps, 3 inwrist and finger flexors , MAS 2-3 Left knee ext tone , MAS 3 at Left ankle inverters--tone unchanged  Musculoskeletal: reduced Left elbow ext due to tone, wrist and fingers in flexed position on L side unchanged    Assessment/Plan: 1. Functional deficits which require 3+ hours per day of interdisciplinary therapy  in a comprehensive inpatient rehab setting. Physiatrist is providing close team supervision and 24 hour management of active medical problems listed below. Physiatrist and rehab team continue to assess barriers to discharge/monitor patient progress toward functional and medical goals  Care Tool:  Bathing    Body parts bathed by patient: Left arm, Chest, Abdomen, Front perineal area, Right upper leg, Left upper leg, Right lower leg, Face, Left  lower leg   Body parts bathed by helper: Buttocks, Right arm Body parts n/a: Left lower leg, Right lower leg, Right arm, Left arm, Front perineal area, Buttocks   Bathing assist Assist Level: Minimal Assistance - Patient > 75% (sitting without standing)     Upper Body Dressing/Undressing Upper body dressing   What is the patient wearing?: Pull over shirt    Upper body assist Assist Level: Moderate Assistance - Patient 50 - 74%    Lower Body Dressing/Undressing Lower body dressing      What is the patient wearing?: Pants, Incontinence brief     Lower body assist Assist for lower body dressing: Maximal Assistance - Patient 25 - 49%     Toileting Toileting    Toileting assist Assist for toileting: Total Assistance - Patient < 25%     Transfers Chair/bed transfer  Transfers assist  Chair/bed transfer activity did not occur: Safety/medical concerns  Chair/bed transfer assist level: Moderate Assistance - Patient 50 - 74% (squat pivot)     Locomotion Ambulation   Ambulation assist   Ambulation activity did not occur: Safety/medical concerns  Assist level: 2 helpers (+2 mod assist with +3 w/c follow to increase distance) Assistive device: Other (comment) (R UE support around shoulders) Max distance: 74ft   Walk 10 feet activity   Assist  Walk 10 feet activity did not occur: Safety/medical concerns  Assist level: 2 helpers (+2 mod assist with +3 w/c follow to increase distance) Assistive device: Other (comment) (R UE support around shoulders)   Walk 50 feet activity   Assist Walk 50 feet with 2 turns activity did not occur: Safety/medical concerns (did not perform turns)         Walk 150 feet activity   Assist Walk 150 feet activity did not occur: Safety/medical concerns         Walk 10 feet on uneven surface  activity   Assist Walk 10 feet on uneven surfaces activity did not occur: Safety/medical concerns          Wheelchair     Assist Will patient use wheelchair at discharge?: Yes Type of Wheelchair: Manual (TIS wheelchair) Wheelchair activity did not occur: Safety/medical concerns  Wheelchair assist level: Dependent - Patient 0% Max wheelchair distance: 159ft - based on caregiver's abilities    Wheelchair 50 feet with 2 turns activity    Assist    Wheelchair 50 feet with 2 turns activity did not occur: Safety/medical concerns   Assist Level: Dependent - Patient 0%   Wheelchair 150 feet activity     Assist  Wheelchair 150 feet activity did not occur: Safety/medical concerns   Assist Level: Dependent - Patient 0%   Blood pressure 109/73, pulse (!) 58, temperature 98 F (36.7 C), resp. rate 18, height 5\' 10"  (1.778 m), weight 79.6 kg, SpO2 94 %.    Medical Problem List and Plan: 1.  L hemiplegia worsening due to recurrent R CR/BG infarct, has associated spasticity and contracture formation - spasticity has worsened significantly with new infarct , cognitive status declined  Continue CIR PT, OT, SLP, speech dysarthric hypophonic and delayed Appreciate neuro input          ELOS 6/25- goals at least mod A goals- Family training in progress Would rec custodial care at home , HTA has benefits for this as discussed with Dr Amalia Hailey  6/20- Continue CIR- PT, OT and SLP 2.  Antithrombotics: -DVT/anticoagulation:  Pharmaceutical: Lovenox             -antiplatelet therapy: DAPT x3 weeks followed by ASA alone. 3. Pain Management: Continue Tylenol as needed  6/20- asking for tylenol for back pain- will con't regimen 4. Depression: LCSW to follow for evaluation and support. Increase Lexapro to 20mg , continue             -antipsychotic agents: N/AA 5. Neuropsych: This patient is not fully capable of making decisions on his own behalf. Neurostim with amantadine check with team in am , may increase dose 6. Skin/Wound Care: Routine pressure-relief measures. 7.  Fluids/Electrolytes/Nutrition: Monitor intake/output.  nl BUN/Cr 8.  History of seizure disorder: Continue Lamictal twice daily 9.  Left spastic hemiparesis: decrease baclofen to 5 mg 4 times daily- may be contributing to drowsiness Had incobotulinum toxin A injection 300U on 04/22/21 to elbow flexors, wrist and finger flexors 6/23 spasticity improved at biceps more so than at wrist and finger flexors now can range without pain somewhat increased after baclofen dose was lowered  10.  History of anxiety disorder: Continue Lexapro.  Hydroxyzine 3 times daily. 11.  Insomnia: Continue melatonin at bedtime.Change trazodone to Seroquel as per neuro.  12.  Hypothyroid: Stable on supplement.  13. Leukocytosis without fever-resolved  14. Elevated LFTs: d/c Lipitor, f/u CMET essentially resolved , will resume at lower dose 20mg  15. AKI: resolved , drinking a little more   6/20- labs look great 6/20- con't to monitor 16.  Dysphagia- poor posture in bed may need to get in [facility] to eat- no signs of aspiration  17.  Episode of urinary retention last week requiring cath will monitor with daily PVRs  -incontinent but voiding regularly, PT notes some somnolence given incont with occ retention check UA 18.  Somnolence improved on lower dose baclofen, will d/c amantadine Repeat CT neg for acute changes   LOS: 24 days A FACE TO FACE EVALUATION WAS PERFORMED  Charlett Blake 05/08/2021, 8:11 AM

## 2021-05-09 NOTE — Progress Notes (Addendum)
Called [facility]; was not aware that had not been called until nurse informed me that needs to be scheduled. Called [facility] at [phone] at [place]. At this time there is no scheduled time for p/u due to calls. Asked for approx time and he was unable to provide at time.   Called [facility] back to via that wasn't put on list yesterday, other stated that doesn't have [facility]'s list but pt is third in line at this time. Said that doesn't schedule pick ups on weekend that first come first serve.

## 2021-05-09 NOTE — Progress Notes (Signed)
9                                                        PROGRESS NOTE   Subjective/Complaints: On waitlist for [facility] Stable for d/c today Having a hard time getting his neck comfortable, spastic, placed pillow under his back which seems to help  ROS: Limited due to language/communication    Objective:   CT HEAD WO CONTRAST  Result Date: 05/07/2021 CLINICAL DATA:  Stroke, follow-up examination EXAM: CT HEAD WITHOUT CONTRAST TECHNIQUE: Contiguous axial images were obtained from the base of the skull through the vertex without intravenous contrast. COMPARISON:  CT and MRI 04/10/2021 FINDINGS: Brain: Surgical changes of left frontal craniotomy and resection cavity within the left frontal lobe are again identified, unchanged. Moderate parenchymal volume loss is stable when compared to prior examination. Similarly, diffuse subcortical and periventricular white matter hypoattenuation is unchanged possibly reflecting the sequela of small vessel ischemia or radiation therapy in the appropriate clinical setting. Remote infarct within the right basal ganglia and corona radiata again noted, unchanged. No evidence of acute intracranial hemorrhage or infarct. Mild mass effect upon the anterior horn of the left lateral ventricle by the cystic resection cavity is unchanged with stable 5-6 mm left right midline shift anteriorly. Ventricular size is normal. Cerebellum is unremarkable. Vascular: No asymmetric hyperdense vasculature at the skull base. Skull: No acute fracture Sinuses/Orbits: The paranasal sinuses are clear. The orbits are unremarkable. Other: The mastoid air cells and middle ear cavities are clear. IMPRESSION: Stable appearance of a chronic right basal ganglia and corona radiata infarct. No evidence of interval hemorrhage or acute infarct. Stable changes of left frontal resection with mild mass effect of the resection cavity upon the anterior horn of the left lateral ventricle and resultant midline  shift. Stable diffuse white matter changes, nonspecific. This may reflect changes of radiation therapy in the appropriate clinical setting. Electronically Signed   By: Fidela Salisbury MD   On: 05/07/2021 19:58    No results for input(s): WBC, HGB, HCT, PLT in the last 72 hours.   No results for input(s): NA, K, CL, CO2, GLUCOSE, BUN, CREATININE, CALCIUM in the last 72 hours.    Intake/Output Summary (Last 24 hours) at 05/09/2021 1249 Last data filed at 05/09/2021 0813 Gross per 24 hour  Intake 180 ml  Output --  Net 180 ml        Physical Exam: Vital Signs Blood pressure 125/71, pulse 79, temperature (!) 97.5 F (36.4 C), temperature source Oral, resp. rate 18, height 5\' 10"  (1.778 m), weight 79.6 kg, SpO2 94 %. Gen: no distress, normal appearing HEENT: oral mucosa pink and moist, NCAT Cardio: Reg rate Chest: normal effort, normal rate of breathing Abd: soft, non-distended Ext: no edema Psych: pleasant, normal affect Skin: intact    Neurologic:Motor 0/5 LUE, LLE 2-3/5--no change Tone  MAS 3 in Left biceps, 3 inwrist and finger flexors , MAS 2-3 Left knee ext tone , MAS 3 at Left ankle inverters--tone unchanged  Musculoskeletal: reduced Left elbow ext due to tone, wrist and fingers in flexed position on L side unchanged  Right sided neck muscles tight and spastic   Assessment/Plan: 1. Functional deficits which require 3+ hours per day of interdisciplinary therapy in a comprehensive inpatient rehab setting. Physiatrist is providing close team supervision  and 24 hour management of active medical problems listed below. Physiatrist and rehab team continue to assess barriers to discharge/monitor patient progress toward functional and medical goals  Care Tool:  Bathing    Body parts bathed by patient: Left arm, Chest, Abdomen, Front perineal area, Right upper leg, Left upper leg, Right lower leg, Face, Left lower leg   Body parts bathed by helper: Buttocks, Right arm Body  parts n/a: Left lower leg, Right lower leg, Right arm, Left arm, Front perineal area, Buttocks   Bathing assist Assist Level: Minimal Assistance - Patient > 75%     Upper Body Dressing/Undressing Upper body dressing   What is the patient wearing?: Pull over shirt    Upper body assist Assist Level: Moderate Assistance - Patient 50 - 74%    Lower Body Dressing/Undressing Lower body dressing      What is the patient wearing?: Pants, Incontinence brief     Lower body assist Assist for lower body dressing: Maximal Assistance - Patient 25 - 49%     Toileting Toileting    Toileting assist Assist for toileting: Total Assistance - Patient < 25%     Transfers Chair/bed transfer  Transfers assist  Chair/bed transfer activity did not occur: Safety/medical concerns  Chair/bed transfer assist level: Moderate Assistance - Patient 50 - 74% (squat pivot)     Locomotion Ambulation   Ambulation assist   Ambulation activity did not occur: Safety/medical concerns  Assist level: 2 helpers (+2 mod assist with +3 w/c follow to increase distance) Assistive device: Other (comment) (R UE support around shoulders) Max distance: 38ft   Walk 10 feet activity   Assist  Walk 10 feet activity did not occur: Safety/medical concerns  Assist level: 2 helpers (+2 mod assist with +3 w/c follow to increase distance) Assistive device: Other (comment) (R UE support around shoulders)   Walk 50 feet activity   Assist Walk 50 feet with 2 turns activity did not occur: Safety/medical concerns (did not perform turns)         Walk 150 feet activity   Assist Walk 150 feet activity did not occur: Safety/medical concerns         Walk 10 feet on uneven surface  activity   Assist Walk 10 feet on uneven surfaces activity did not occur: Safety/medical concerns         Wheelchair     Assist Will patient use wheelchair at discharge?: Yes Type of Wheelchair: Manual (TIS  wheelchair) Wheelchair activity did not occur: Safety/medical concerns  Wheelchair assist level: Dependent - Patient 0% Max wheelchair distance: 182ft - based on caregiver's abilities    Wheelchair 50 feet with 2 turns activity    Assist    Wheelchair 50 feet with 2 turns activity did not occur: Safety/medical concerns   Assist Level: Dependent - Patient 0%   Wheelchair 150 feet activity     Assist  Wheelchair 150 feet activity did not occur: Safety/medical concerns   Assist Level: Dependent - Patient 0%   Blood pressure 125/71, pulse 79, temperature (!) 97.5 F (36.4 C), temperature source Oral, resp. rate 18, height 5\' 10"  (1.778 m), weight 79.6 kg, SpO2 94 %.    Medical Problem List and Plan: 1.  L hemiplegia worsening due to recurrent R CR/BG infarct, has associated spasticity and contracture formation - spasticity has worsened significantly with new infarct , cognitive status declined          d/c today. speech dysarthric hypophonic and delayed Appreciate  neuro input          ELOS 6/25- goals at least mod A goals- Family training in progress Would rec custodial care at home , HTA has benefits for this as discussed with Dr Amalia Hailey 2.  Antithrombotics: -DVT/anticoagulation:  Pharmaceutical: Lovenox             -antiplatelet therapy: DAPT x3 weeks followed by ASA alone. 3. Neck pain: Continue Tylenol as needed. Add heating pad.  4. Depression: LCSW to follow for evaluation and support. Continue Lexapro 20mg              -antipsychotic agents: N/AA 5. Neuropsych: This patient is not fully capable of making decisions on his own behalf. Neurostim with amantadine check with team in am , may increase dose 6. Skin/Wound Care: Routine pressure-relief measures. 7. Fluids/Electrolytes/Nutrition: Monitor intake/output.  nl BUN/Cr 8.  History of seizure disorder: Continue Lamictal twice daily 9.  Left spastic hemiparesis: decrease baclofen to 5 mg 4 times daily- may be  contributing to drowsiness Had incobotulinum toxin A injection 300U on 04/22/21 to elbow flexors, wrist and finger flexors 6/23 spasticity improved at biceps more so than at wrist and finger flexors now can range without pain somewhat increased after baclofen dose was lowered  10.  History of anxiety disorder: Continue Lexapro.  Hydroxyzine 3 times daily. 11.  Insomnia: Continue melatonin at bedtime.Change trazodone to Seroquel as per neuro.  12.  Hypothyroid: Stable on supplement. 13. Leukocytosis without fever-resolved  14. Elevated LFTs: d/c Lipitor, f/u CMET essentially resolved , will resume at lower dose 20mg  15. AKI: resolved , drinking a little more   6/20- labs look great 6/20- con't to monitor 16.  Dysphagia- poor posture in bed may need to get in [facility] to eat- no signs of aspiration  17.  Episode of urinary retention last week requiring cath will monitor with daily PVRs  -incontinent but voiding regularly, PT notes some somnolence given incont with occ retention check UA 18.  Somnolence improved on lower dose baclofen, will d/c amantadine Repeat CT neg for acute changes    >30 minutes spent in discharge of patient including review of medications and follow-up appointments, physical examination, and in answering all patient's questions   LOS: 25 days A FACE TO FACE EVALUATION WAS [place] 05/09/2021, 12:49 PM

## 2021-05-09 NOTE — Plan of Care (Signed)
  Problem: RH BLADDER ELIMINATION Goal: RH STG MANAGE BLADDER WITH ASSISTANCE Description: STG Manage Bladder With mod I Assistance Outcome: Completed/Met

## 2021-05-09 NOTE — Progress Notes (Signed)
Patient was picked up from hospital via [place] and transported via stretcher to home. Patient was accompanied by three transport persons. Sanda Linger, LPN

## 2021-05-11 DIAGNOSIS — G464 Cerebellar stroke syndrome: Secondary | ICD-10-CM | POA: Diagnosis not present

## 2021-05-11 DIAGNOSIS — G40909 Epilepsy, unspecified, not intractable, without status epilepticus: Secondary | ICD-10-CM | POA: Diagnosis not present

## 2021-05-11 DIAGNOSIS — R269 Unspecified abnormalities of gait and mobility: Secondary | ICD-10-CM | POA: Diagnosis not present

## 2021-05-11 DIAGNOSIS — C719 Malignant neoplasm of brain, unspecified: Secondary | ICD-10-CM | POA: Diagnosis not present

## 2021-05-12 DIAGNOSIS — R269 Unspecified abnormalities of gait and mobility: Secondary | ICD-10-CM | POA: Diagnosis not present

## 2021-05-12 DIAGNOSIS — C719 Malignant neoplasm of brain, unspecified: Secondary | ICD-10-CM | POA: Diagnosis not present

## 2021-05-12 DIAGNOSIS — G464 Cerebellar stroke syndrome: Secondary | ICD-10-CM | POA: Diagnosis not present

## 2021-05-12 DIAGNOSIS — G40909 Epilepsy, unspecified, not intractable, without status epilepticus: Secondary | ICD-10-CM | POA: Diagnosis not present

## 2021-05-13 DIAGNOSIS — C719 Malignant neoplasm of brain, unspecified: Secondary | ICD-10-CM | POA: Diagnosis not present

## 2021-05-13 DIAGNOSIS — R269 Unspecified abnormalities of gait and mobility: Secondary | ICD-10-CM | POA: Diagnosis not present

## 2021-05-13 DIAGNOSIS — G40909 Epilepsy, unspecified, not intractable, without status epilepticus: Secondary | ICD-10-CM | POA: Diagnosis not present

## 2021-05-13 DIAGNOSIS — G464 Cerebellar stroke syndrome: Secondary | ICD-10-CM | POA: Diagnosis not present

## 2021-05-14 ENCOUNTER — Telehealth: Payer: Self-pay | Admitting: *Deleted

## 2021-05-14 DIAGNOSIS — C719 Malignant neoplasm of brain, unspecified: Secondary | ICD-10-CM | POA: Diagnosis not present

## 2021-05-14 DIAGNOSIS — I69391 Dysphagia following cerebral infarction: Secondary | ICD-10-CM | POA: Diagnosis not present

## 2021-05-14 DIAGNOSIS — I69320 Aphasia following cerebral infarction: Secondary | ICD-10-CM | POA: Diagnosis not present

## 2021-05-14 DIAGNOSIS — R269 Unspecified abnormalities of gait and mobility: Secondary | ICD-10-CM | POA: Diagnosis not present

## 2021-05-14 DIAGNOSIS — I69354 Hemiplegia and hemiparesis following cerebral infarction affecting left non-dominant side: Secondary | ICD-10-CM | POA: Diagnosis not present

## 2021-05-14 DIAGNOSIS — I69351 Hemiplegia and hemiparesis following cerebral infarction affecting right dominant side: Secondary | ICD-10-CM | POA: Diagnosis not present

## 2021-05-14 DIAGNOSIS — Z9181 History of falling: Secondary | ICD-10-CM | POA: Diagnosis not present

## 2021-05-14 DIAGNOSIS — Z85841 Personal history of malignant neoplasm of brain: Secondary | ICD-10-CM | POA: Diagnosis not present

## 2021-05-14 DIAGNOSIS — E039 Hypothyroidism, unspecified: Secondary | ICD-10-CM | POA: Diagnosis not present

## 2021-05-14 DIAGNOSIS — Z7982 Long term (current) use of aspirin: Secondary | ICD-10-CM | POA: Diagnosis not present

## 2021-05-14 DIAGNOSIS — G4700 Insomnia, unspecified: Secondary | ICD-10-CM | POA: Diagnosis not present

## 2021-05-14 DIAGNOSIS — R1310 Dysphagia, unspecified: Secondary | ICD-10-CM | POA: Diagnosis not present

## 2021-05-14 DIAGNOSIS — G40909 Epilepsy, unspecified, not intractable, without status epilepticus: Secondary | ICD-10-CM | POA: Diagnosis not present

## 2021-05-14 DIAGNOSIS — G464 Cerebellar stroke syndrome: Secondary | ICD-10-CM | POA: Diagnosis not present

## 2021-05-14 DIAGNOSIS — H539 Unspecified visual disturbance: Secondary | ICD-10-CM | POA: Diagnosis not present

## 2021-05-14 DIAGNOSIS — F419 Anxiety disorder, unspecified: Secondary | ICD-10-CM | POA: Diagnosis not present

## 2021-05-14 NOTE — Telephone Encounter (Signed)
Per Dr Letta Pate, notified Howard White's mother for him to stop the Tums.  She is going to bring disability papers that they need addressed.

## 2021-05-14 NOTE — Telephone Encounter (Signed)
Michelle OT called for POC 1wk1, 2wk4.  She is also reporting as they are required, that there are some interactions between his calcium (Tums) and his synthroid, hydroxyzine, ecitalopram, and QUEtiapine.  Approval given for POC.

## 2021-05-15 ENCOUNTER — Telehealth: Payer: Self-pay | Admitting: *Deleted

## 2021-05-15 DIAGNOSIS — G4700 Insomnia, unspecified: Secondary | ICD-10-CM | POA: Diagnosis not present

## 2021-05-15 DIAGNOSIS — Z7982 Long term (current) use of aspirin: Secondary | ICD-10-CM | POA: Diagnosis not present

## 2021-05-15 DIAGNOSIS — C719 Malignant neoplasm of brain, unspecified: Secondary | ICD-10-CM | POA: Diagnosis not present

## 2021-05-15 DIAGNOSIS — I69354 Hemiplegia and hemiparesis following cerebral infarction affecting left non-dominant side: Secondary | ICD-10-CM | POA: Diagnosis not present

## 2021-05-15 DIAGNOSIS — R1310 Dysphagia, unspecified: Secondary | ICD-10-CM | POA: Diagnosis not present

## 2021-05-15 DIAGNOSIS — Z85841 Personal history of malignant neoplasm of brain: Secondary | ICD-10-CM | POA: Diagnosis not present

## 2021-05-15 DIAGNOSIS — Z9181 History of falling: Secondary | ICD-10-CM | POA: Diagnosis not present

## 2021-05-15 DIAGNOSIS — E039 Hypothyroidism, unspecified: Secondary | ICD-10-CM | POA: Diagnosis not present

## 2021-05-15 DIAGNOSIS — H539 Unspecified visual disturbance: Secondary | ICD-10-CM | POA: Diagnosis not present

## 2021-05-15 DIAGNOSIS — F419 Anxiety disorder, unspecified: Secondary | ICD-10-CM | POA: Diagnosis not present

## 2021-05-15 DIAGNOSIS — R269 Unspecified abnormalities of gait and mobility: Secondary | ICD-10-CM | POA: Diagnosis not present

## 2021-05-15 DIAGNOSIS — G40909 Epilepsy, unspecified, not intractable, without status epilepticus: Secondary | ICD-10-CM | POA: Diagnosis not present

## 2021-05-15 DIAGNOSIS — G464 Cerebellar stroke syndrome: Secondary | ICD-10-CM | POA: Diagnosis not present

## 2021-05-15 DIAGNOSIS — I69391 Dysphagia following cerebral infarction: Secondary | ICD-10-CM | POA: Diagnosis not present

## 2021-05-15 DIAGNOSIS — I69320 Aphasia following cerebral infarction: Secondary | ICD-10-CM | POA: Diagnosis not present

## 2021-05-15 DIAGNOSIS — I69351 Hemiplegia and hemiparesis following cerebral infarction affecting right dominant side: Secondary | ICD-10-CM | POA: Diagnosis not present

## 2021-05-15 MED ORDER — LORAZEPAM 0.5 MG PO TABS: 0.5000 mg | ORAL_TABLET | Freq: Three times a day (TID) | ORAL | 0 refills | Status: AC

## 2021-05-15 NOTE — Telephone Encounter (Signed)
Howard White's mother asked if he could have lorazepam sent in for him. He was on it before but did not get added to his discharge med list. He is having anxiety issues especially when they have him in the hoyer lift.

## 2021-05-19 ENCOUNTER — Telehealth: Payer: Self-pay | Admitting: *Deleted

## 2021-05-19 NOTE — Telephone Encounter (Signed)
I told this patient to reduce Seroquel to 25 mg nightly and discontinue Lexapro.  Have asked them to increase the use of as needed Ativan 0.5 mg he may need another prescription for this. He apparently has been sweating a lot with some mental status changes.  It does not sound after talking to the mother that it is markedly different from what we observed on the inpatient unit.

## 2021-05-19 NOTE — Telephone Encounter (Signed)
Lyon's mother called with concerns over a change in Howard White condition.  She states there has been a change in his verbalization ability, he is less alert, has an odd gaze, his agitation is very bad, he is sweating profusely, cannot stop moving. And the SLP has downgraded him to a puree diet. She wonders if he is having some tardive dyskinesia? He has moved so much that he has broken the hospital bed railing and they have been up all night to keep him in the bed. She also notes he is leaning to the left now when they set him up.  Please advise.  His appt with Dr Letta Pate is 06/05/21, and [facility] Frann Rider 07/15/21.  Can he get a new bed?

## 2021-05-19 NOTE — Telephone Encounter (Signed)
Medication sent in by DR Kirsteins.

## 2021-05-21 ENCOUNTER — Other Ambulatory Visit: Payer: Self-pay | Admitting: *Deleted

## 2021-05-21 ENCOUNTER — Telehealth: Payer: Self-pay

## 2021-05-21 NOTE — Telephone Encounter (Signed)
Telephone Call/Scheduled Initial Visit SW completed call with patient/PCG to schedule an initial palliative care visit. Visit scheduled for 05/27/21@11  am

## 2021-05-26 ENCOUNTER — Ambulatory Visit: Payer: Self-pay | Admitting: Neurology

## 2021-05-27 ENCOUNTER — Other Ambulatory Visit: Payer: PPO | Admitting: *Deleted

## 2021-05-27 ENCOUNTER — Other Ambulatory Visit: Payer: PPO

## 2021-05-27 ENCOUNTER — Other Ambulatory Visit: Payer: Self-pay

## 2021-05-27 VITALS — BP 102/79 | HR 85 | Temp 97.8°F | Resp 16

## 2021-05-27 DIAGNOSIS — Z515 Encounter for palliative care: Secondary | ICD-10-CM

## 2021-05-28 ENCOUNTER — Other Ambulatory Visit: Payer: Self-pay | Admitting: Neurology

## 2021-05-28 ENCOUNTER — Telehealth: Payer: Self-pay | Admitting: Physical Medicine & Rehabilitation

## 2021-05-28 DIAGNOSIS — Z85841 Personal history of malignant neoplasm of brain: Secondary | ICD-10-CM | POA: Diagnosis not present

## 2021-05-28 DIAGNOSIS — I69320 Aphasia following cerebral infarction: Secondary | ICD-10-CM | POA: Diagnosis not present

## 2021-05-28 DIAGNOSIS — G40909 Epilepsy, unspecified, not intractable, without status epilepticus: Secondary | ICD-10-CM | POA: Diagnosis not present

## 2021-05-28 DIAGNOSIS — I69391 Dysphagia following cerebral infarction: Secondary | ICD-10-CM | POA: Diagnosis not present

## 2021-05-28 DIAGNOSIS — H539 Unspecified visual disturbance: Secondary | ICD-10-CM | POA: Diagnosis not present

## 2021-05-28 DIAGNOSIS — I69354 Hemiplegia and hemiparesis following cerebral infarction affecting left non-dominant side: Secondary | ICD-10-CM | POA: Diagnosis not present

## 2021-05-28 DIAGNOSIS — R1310 Dysphagia, unspecified: Secondary | ICD-10-CM | POA: Diagnosis not present

## 2021-05-28 DIAGNOSIS — Z7982 Long term (current) use of aspirin: Secondary | ICD-10-CM | POA: Diagnosis not present

## 2021-05-28 DIAGNOSIS — E039 Hypothyroidism, unspecified: Secondary | ICD-10-CM | POA: Diagnosis not present

## 2021-05-28 DIAGNOSIS — I69351 Hemiplegia and hemiparesis following cerebral infarction affecting right dominant side: Secondary | ICD-10-CM | POA: Diagnosis not present

## 2021-05-28 DIAGNOSIS — G4700 Insomnia, unspecified: Secondary | ICD-10-CM | POA: Diagnosis not present

## 2021-05-28 DIAGNOSIS — F419 Anxiety disorder, unspecified: Secondary | ICD-10-CM | POA: Diagnosis not present

## 2021-05-28 DIAGNOSIS — Z9181 History of falling: Secondary | ICD-10-CM

## 2021-05-28 NOTE — Telephone Encounter (Signed)
Patients mother would like a call back from Dr. Letta Pate.  She is having issues with getting all of his medications to him.  Patient is having issues swallowing, not eating and loosing weight.  She can get some of his medications crushed up and put in a shake, but hard to get all of them crushed.  She would like to know if some of his medications can be omitted.  Please call patient about all of medications he is taking, and if he needs to take them all.

## 2021-05-29 DIAGNOSIS — I6381 Other cerebral infarction due to occlusion or stenosis of small artery: Secondary | ICD-10-CM | POA: Diagnosis not present

## 2021-05-29 DIAGNOSIS — R159 Full incontinence of feces: Secondary | ICD-10-CM | POA: Diagnosis not present

## 2021-05-30 ENCOUNTER — Other Ambulatory Visit: Payer: Self-pay | Admitting: Physical Medicine and Rehabilitation

## 2021-05-30 NOTE — Progress Notes (Signed)
COMMUNITY PALLIATIVE CARE SW NOTE  PATIENT NAME: Howard Howard DOB: [birth year] MRN: [id number]  PRIMARY CARE PROVIDER: Seward Carol, MD  RESPONSIBLE PARTY:  Acct ID - Guarantor Home Phone Work Phone Relationship Acct Type  [id number] Howard, Howard [phone]  Self P/F     [place], [place], [place] [id number]     PLAN OF CARE and INTERVENTIONS:             GOALS OF CARE/ ADVANCE CARE PLANNING:  Goal is to keep patient at home. Patient is a DNR. SOCIAL/EMOTIONAL/SPIRITUAL ASSESSMENT/ INTERVENTIONS:  SW and RN- Howard Howard completed visit with patient at his home. He was present with his parents and sister. Patient is bedbound and totally dependent for all ADL's. Patient is unable to verbalize needs due to previous strokes. Patient's parents are his primary caregivers and he has sitters through [place]. SW and RN provided education to patient's family regarding palliative care services, role in care, how it differs from hospice care and visit frequency. They provided verbal and written consent for palliative care services despite the team advising that patient appears to be more appropriate for hospice. His parents wanted to see if patient responded to therapy before deciding on hospice . Patient is scheduled to start PT, OT and ST this week through [facility]. He will also have and adie 1x week through [facility]. Patient had a stroke in in 2021 and another one six months later that left him dependent for ADL's. His family described his status since he returned home from the hospital on 6/25 as weight loss,loss of muscle mass, went from a chopped to puree diet, but has minimal intake and pockets food. His meds are crushed as he is having difficulty swallowing. Patient  has a Theatre manager for [employer]. His parents serve as his POA/HCPOA. His family requested that he be a DNR. SW joined the [employer] for a telehealth visit with patient's parents to clarify desire for DNR. DNR approved  and left in the home. SW provided observation, assessment of patient needs and comfort, education and support to family, and assist with getting DNR in the home.  PATIENT/CAREGIVER EDUCATION/ COPING:  Family is coping adequately.  PERSONAL EMERGENCY PLAN:  911 can be accessed for emergencies. COMMUNITY RESOURCES COORDINATION/ HEALTH CARE NAVIGATION:  Howard Howard is providing PT, OT, ST, nursing and aide services. FINANCIAL/LEGAL CONCERNS/INTERVENTIONS:  None     SOCIAL HX:  Social History   Tobacco Use   Smoking status: Never   Smokeless tobacco: Never  Substance Use Topics   Alcohol use: Yes    Alcohol/week: 0.0 standard drinks    Comment: 2 beers a month    CODE STATUS: DNR ADVANCED DIRECTIVES: Yes MOST FORM COMPLETE:  Education provided HOSPICE EDUCATION PROVIDED: Yes, but declined to move forward today.  PPS: Patient is non-verbal and total care.  Duration of visit and documentation: 60 minutes   Katheren Puller, LCSW

## 2021-06-02 NOTE — Progress Notes (Signed)
[facility]ITY PALLIATIVE CARE RN NOTE  PATIENT NAME: Howard White DOB: [birth year] MRN: [id number]  PRIMARY CARE PROVIDER: Seward Carol, MD  RESPONSIBLE PARTY: Howard White (mother) Acct ID - Guarantor Home Phone Work Phone Relationship Acct Type  [id number] Howard White, Howard White [phone]  Self P/F     [place], [place], [place] [address]   Covid-19 Pre-screening Negative  PLAN OF CARE and INTERVENTION:  ADVANCE CARE PLANNING/GOALS OF CARE: Goal is for patient to remain at home with his parents focus on comfort care. DNR forms left in home per family request (approved by Palliative care NP) PATIENT/CAREGIVER EDUCATION: Explained palliative care services vs hospice services, symptom management DISEASE STATUS: Joint initial palliative care visit completed with Howard White, MSW. Met with patient, mother, father and sister in the home. Patient lying in bed with eyes open, but not making much eye contact. He is not verbally unresponsive. No physical indicators of pain noted. Family was able to provide patient's health history. Patient is total care with all ADLs and transferred via [facility] lift. They have in home care through [name] [facility] agency for 4 hours day/5 days per week. He is receiving home health PT/OT/ST/aide through [facility]. He had a hospitalization from 04/08/21 to 04/14/21 due to an acute CVA. Howard White says when patient first returned home from the hospital, he had persistent generalized involuntary movements from tardive dyskinesia. This has calmed tremendously since then, with no symptoms noted today. He has a poor appetite with increased difficulty swallowing and pocketing food. Speech therapist downgraded his diet from chopped meats to Pureed. He is only able to handle 58m's of liquids at a time. He takes his medications crushed in applesauce. He did eat about 3/4 of a small bowl of oatmeal this morning. He is incontinent of both bowel and bladder. Apparent muscle wasting. Provided  education regarding hospice services and advised family that patient is more appropriate for hospice care vs palliative care. PFraser Dinwill let uKoreaknow when she is ready to transition him to hospice. I provided update to BMcMurrayregarding his overall condition. Family very appreciative of visit.  CODE STATUS: DNR (Forms left in the home) ADVANCED DIRECTIVES: Y MOST FORM: forms left in the home for review/selections PPS: 30%   PHYSICAL EXAM:   VITALS: Today's Vitals   05/27/21 1137  BP: 102/79  Pulse: 85  Resp: 16  Temp: 97.8 F (36.6 C)  TempSrc: Temporal  SpO2: 94%  PainSc: 0-No pain    LUNGS: clear to auscultation  CARDIAC: Cor RRR EXTREMITIES: No edema SKIN:  Several abrasions noted on patient's right toes due to attempt to get out of bed   NEURO:  Awake, but verbally unresponsive, increased generalized weakness, total care, [facility] lift transfer   (Duration of visit and documentation 90 minutes)   MDaryl Eastern RN BSN

## 2021-06-04 DIAGNOSIS — I6381 Other cerebral infarction due to occlusion or stenosis of small artery: Secondary | ICD-10-CM | POA: Diagnosis not present

## 2021-06-04 DIAGNOSIS — R159 Full incontinence of feces: Secondary | ICD-10-CM | POA: Diagnosis not present

## 2021-06-05 ENCOUNTER — Encounter: Payer: PPO | Admitting: Physical Medicine & Rehabilitation

## 2021-06-11 ENCOUNTER — Telehealth: Payer: Self-pay | Admitting: Adult Health

## 2021-06-11 NOTE — Telephone Encounter (Signed)
Sympathy card mailed to his family.

## 2021-06-11 NOTE — Telephone Encounter (Signed)
[name] [name] called to inform us that the pt has passed away on 06/04/21. Pt's appt has been cx. Pat requested for all his providers here to be informed and to thank them for all that was done for the pt.

## 2021-06-15 DEATH — deceased

## 2021-07-15 ENCOUNTER — Ambulatory Visit: Payer: PPO | Admitting: Adult Health

## 2021-07-15 ENCOUNTER — Inpatient Hospital Stay: Payer: PPO | Admitting: Adult Health

## 2021-10-17 NOTE — Progress Notes (Signed)
Chart reviewed, agree above plan ?

## 2022-04-09 IMAGING — DX DG CHEST 1V PORT
1 series · 1 of 1 positions shown · non-contrast
Comparison: 11/04/2020

CLINICAL DATA: 59-year-old with cough.

EXAM:
PORTABLE CHEST 1 VIEW

[chest ap]
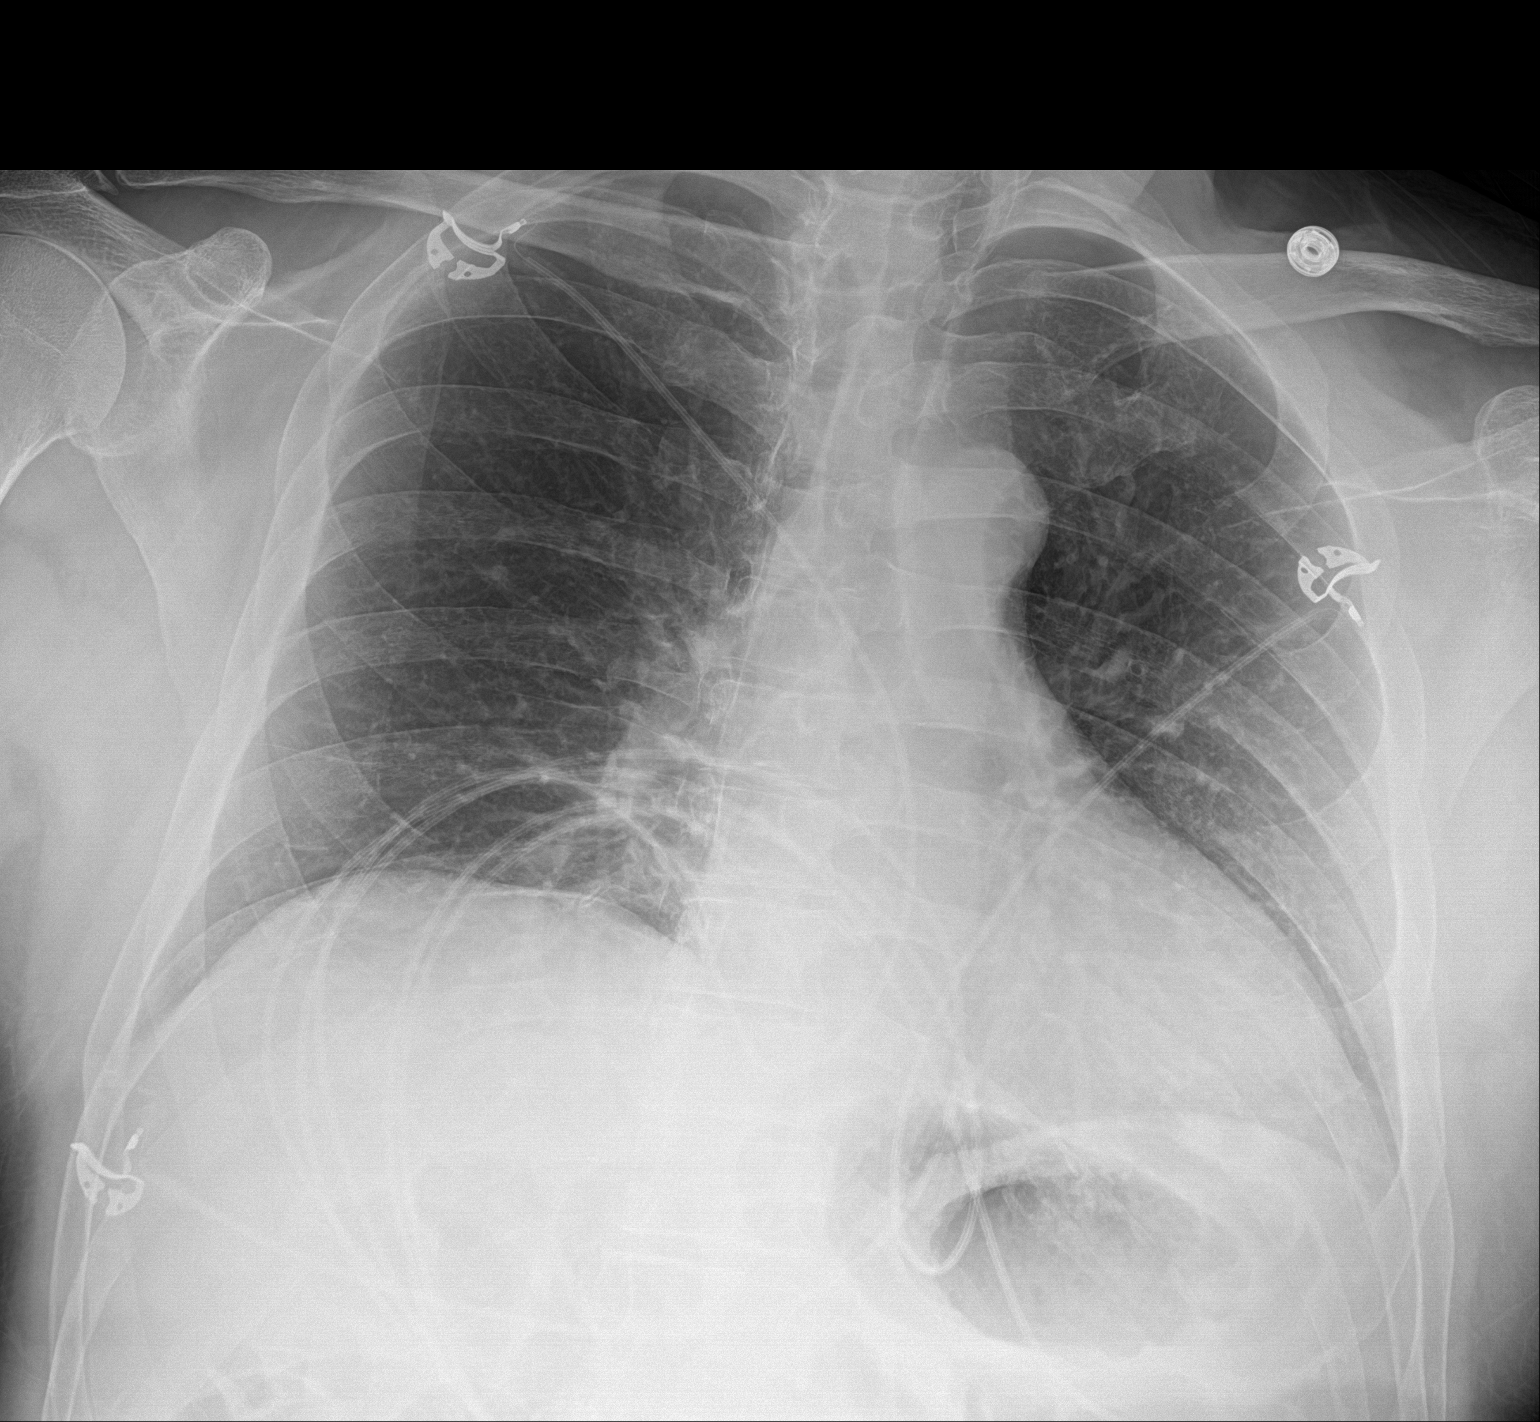

[1 of 1 positions shown; findings below may reference images not displayed]

FINDINGS: Lungs are clear. Heart size is within normal limits and stable.
Patient is mildly rotated towards the left. Bone structures are
unremarkable. Negative for a pneumothorax.
IMPRESSION: No active disease.

## 2022-05-06 IMAGING — CT CT HEAD W/O CM
3 series · 15 of 47 positions shown, 18 images · non-contrast
Comparison: CT and MRI 04/10/2021

CLINICAL DATA: Stroke, follow-up examination

EXAM:
CT HEAD WITHOUT CONTRAST
TECHNIQUE: Contiguous axial images were obtained from the base of the skull
through the vertex without intravenous contrast.

[Series 5: head 5.0 h30s · axial · 0.43mm/px · z∈[-176,-36]mm · 9 of 34 slices shown, 12 images]
[im 3/34  brain]
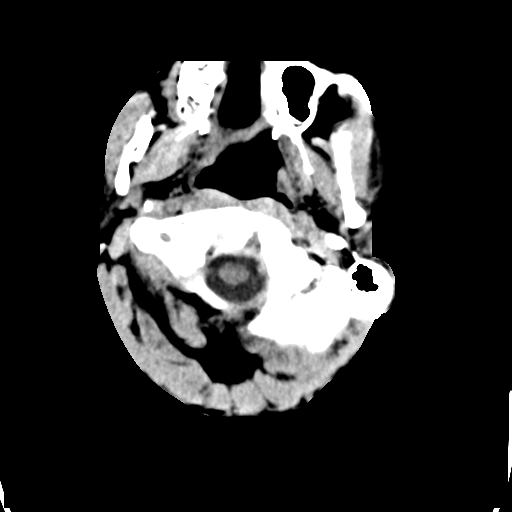
[im 3/34  bone]
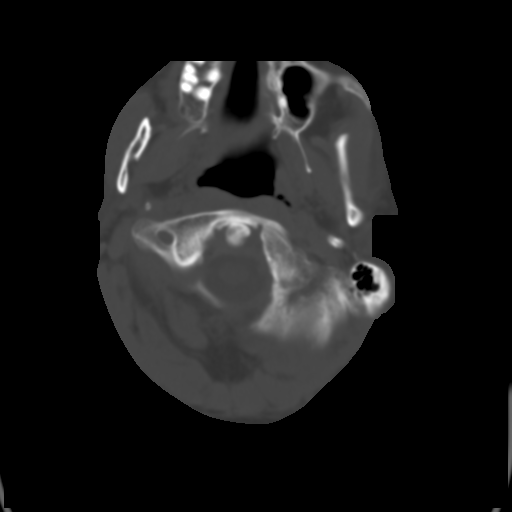
[im 6/34  brain]
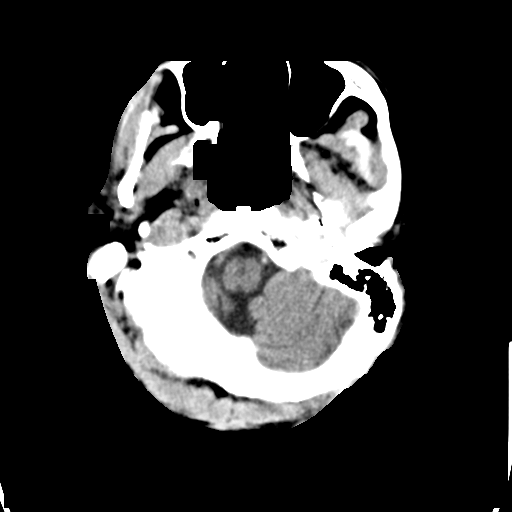
[im 10/34  brain]
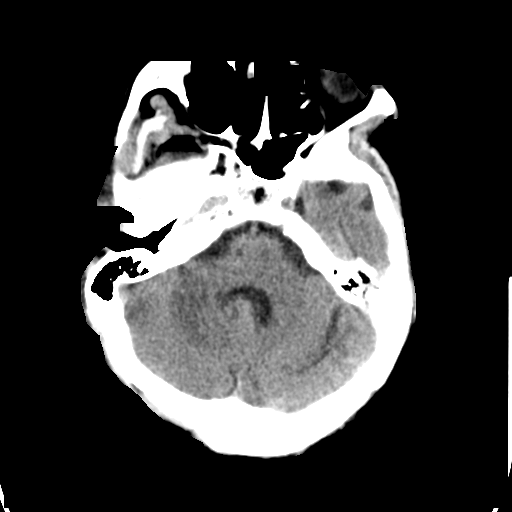
[im 13/34  brain]
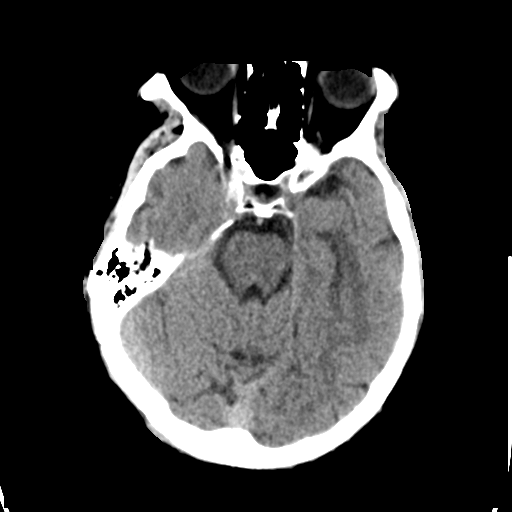
[im 18/34  brain]
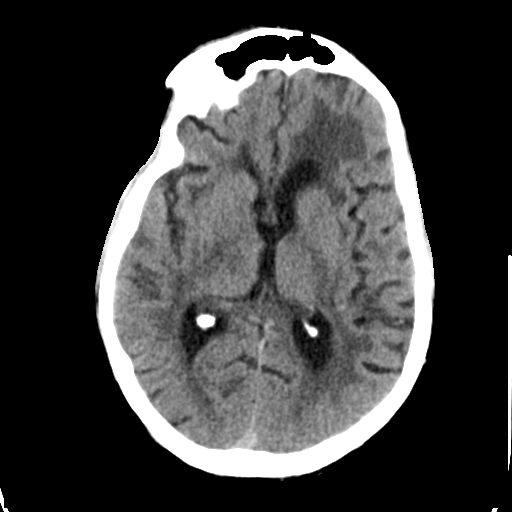
[im 18/34  bone]
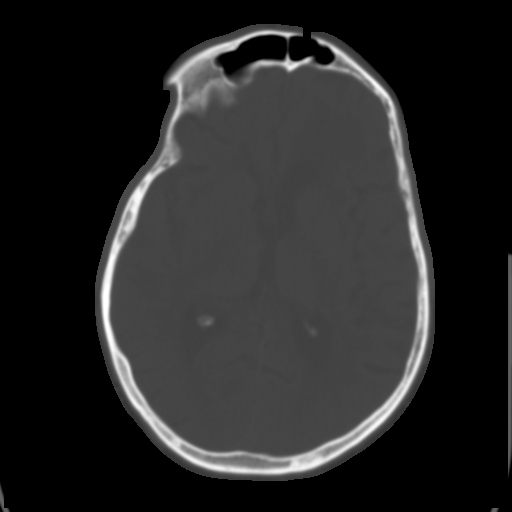
[im 21/34  brain]
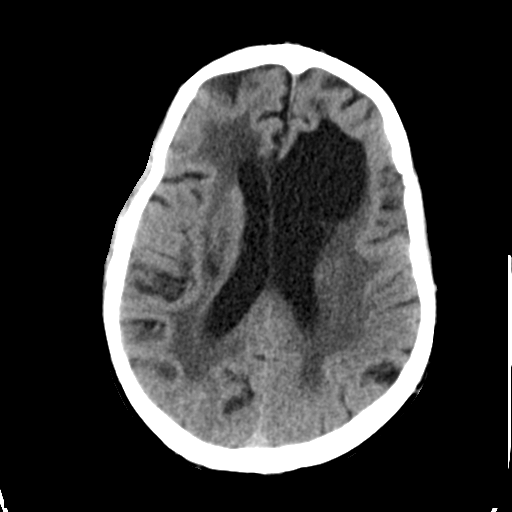
[im 24/34  brain]
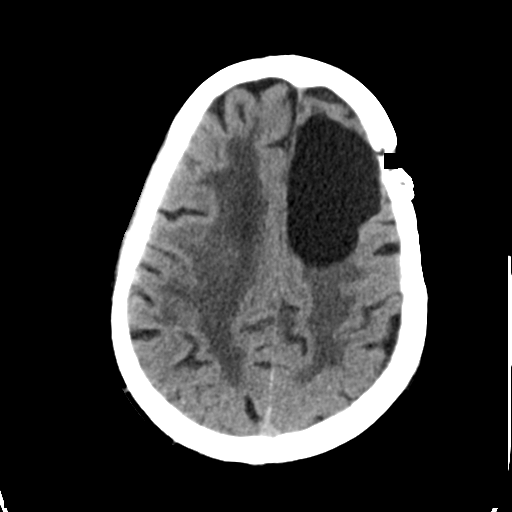
[im 28/34  brain]
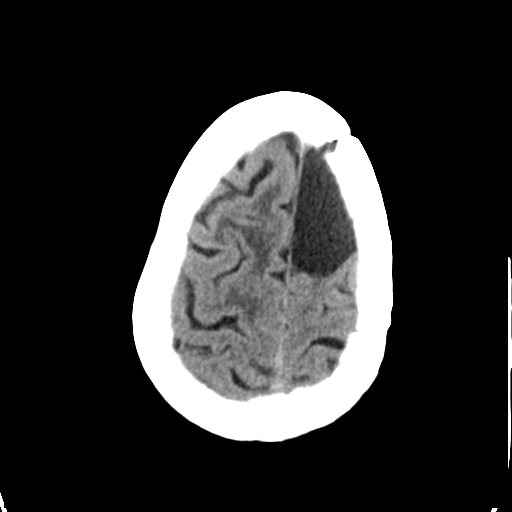
[im 31/34  brain]
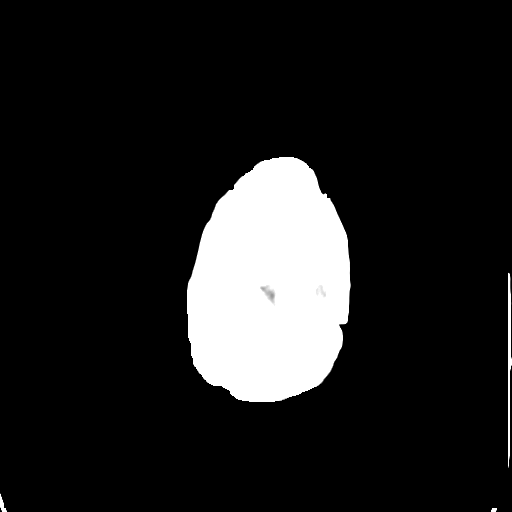
[im 31/34  bone]
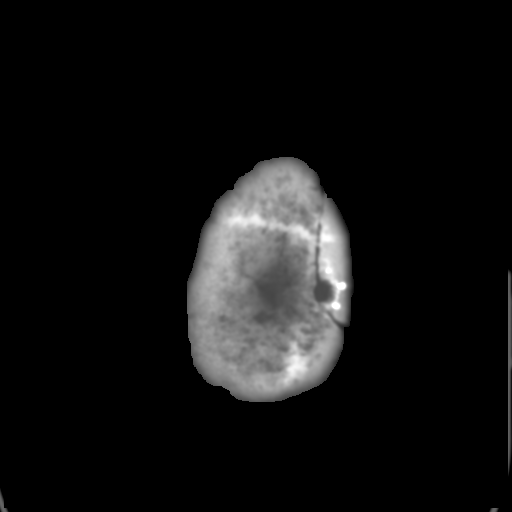

[Series 6: head 3.0 mpr cor · coronal · 0.32mm/px · 3 of 73 slices shown]
[im 25/73  brain]
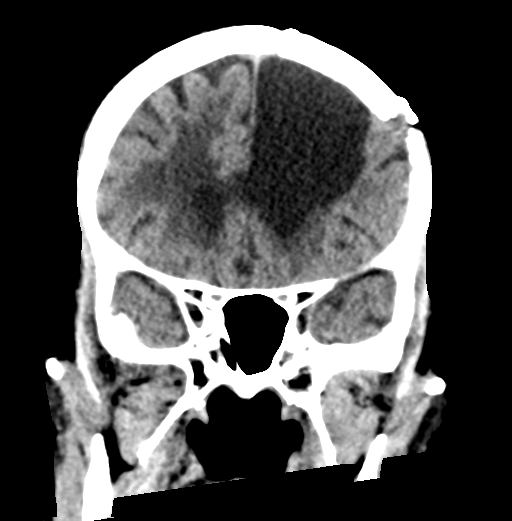
[im 33/73  brain]
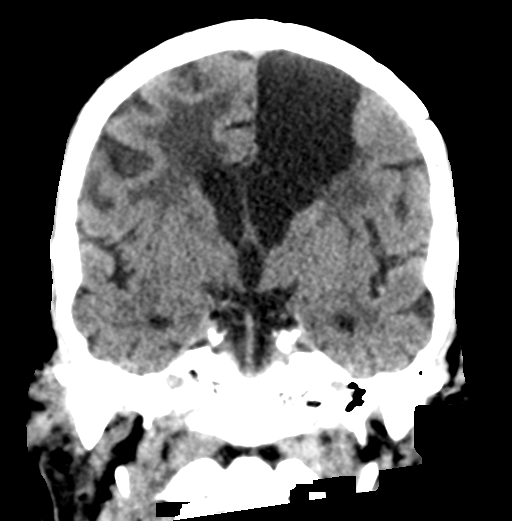
[im 41/73  brain]
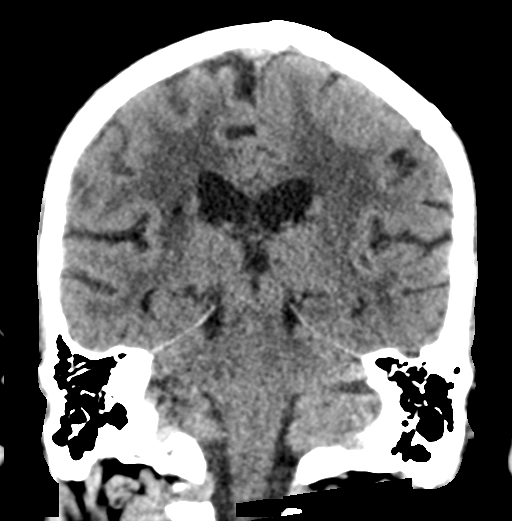

[Series 7: head 3.0 mpr sag · sagittal · 0.33mm/px · 3 of 57 slices shown]
[im 19/57  brain]
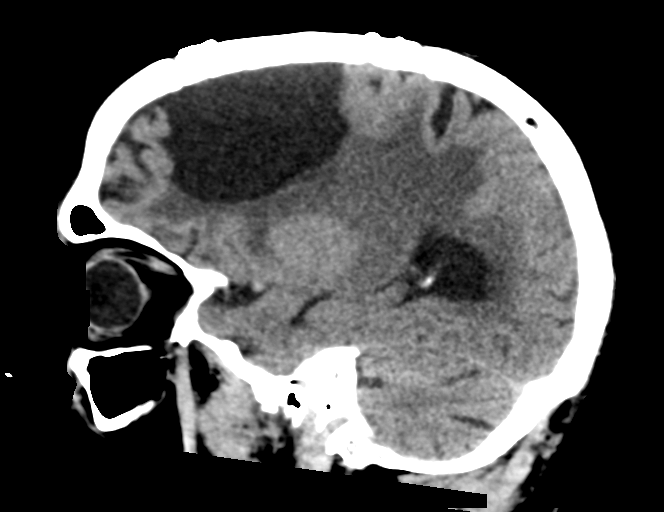
[im 29/57  brain]
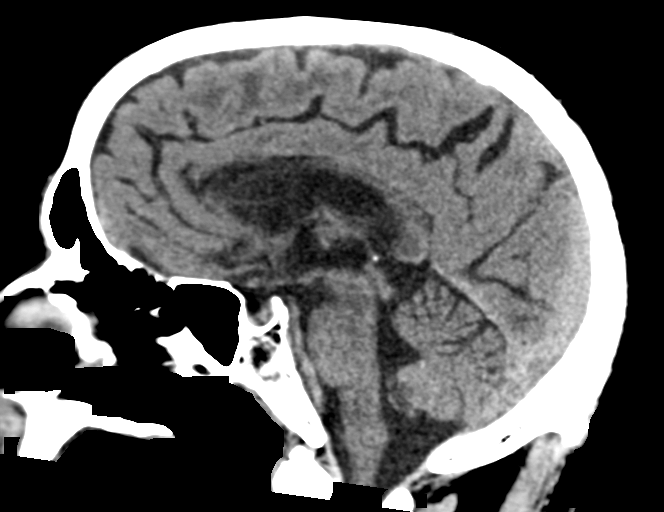
[im 38/57  brain]
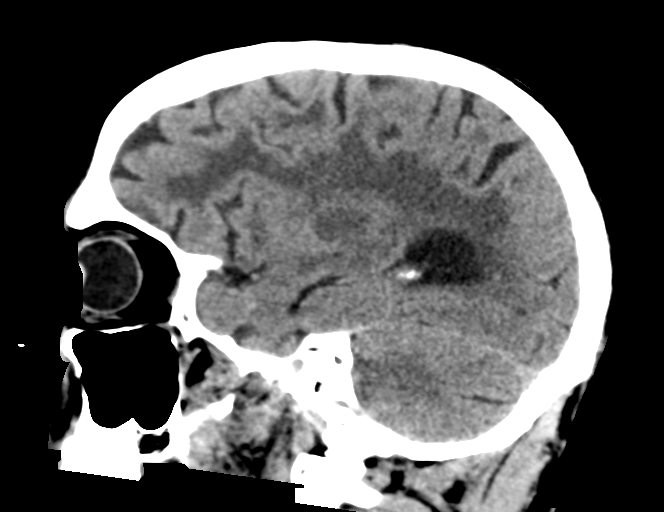

[15 of 47 positions shown; findings below may reference images not displayed]

FINDINGS: Brain: Surgical changes of left frontal craniotomy and resection
cavity within the left frontal lobe are again identified, unchanged.
Moderate parenchymal volume loss is stable when compared to prior
examination. Similarly, diffuse subcortical and periventricular
white matter hypoattenuation is unchanged possibly reflecting the
sequela of small vessel ischemia or radiation therapy in the
appropriate clinical setting. Remote infarct within the right basal
ganglia and corona radiata again noted, unchanged.

No evidence of acute intracranial hemorrhage or infarct. Mild mass
effect upon the anterior horn of the left lateral ventricle by the
cystic resection cavity is unchanged with stable 5-6 mm left right
midline shift anteriorly. Ventricular size is normal. Cerebellum is
unremarkable.

Vascular: No asymmetric hyperdense vasculature at the skull base.

Skull: No acute fracture

Sinuses/Orbits: The paranasal sinuses are clear. The orbits are
unremarkable.

Other: The mastoid air cells and middle ear cavities are clear.
IMPRESSION: Stable appearance of a chronic right basal ganglia and corona
radiata infarct. No evidence of interval hemorrhage or acute
infarct.

Stable changes of left frontal resection with mild mass effect of
the resection cavity upon the anterior horn of the left lateral
ventricle and resultant midline shift.

Stable diffuse white matter changes, nonspecific. This may reflect
changes of radiation therapy in the appropriate clinical setting.
